# Patient Record
Sex: Male | Born: 1960 | Race: Black or African American | Hispanic: No | Smoking: Former smoker
Health system: Southern US, Community
[De-identification: ages and names within clinical notes are randomized; demographics above are authoritative.]

## PROBLEM LIST (undated history)

## (undated) DIAGNOSIS — R569 Unspecified convulsions: Secondary | ICD-10-CM

## (undated) DIAGNOSIS — I872 Venous insufficiency (chronic) (peripheral): Secondary | ICD-10-CM

## (undated) DIAGNOSIS — I482 Chronic atrial fibrillation, unspecified: Secondary | ICD-10-CM

## (undated) DIAGNOSIS — I509 Heart failure, unspecified: Secondary | ICD-10-CM

## (undated) DIAGNOSIS — G4730 Sleep apnea, unspecified: Secondary | ICD-10-CM

## (undated) DIAGNOSIS — I639 Cerebral infarction, unspecified: Secondary | ICD-10-CM

## (undated) DIAGNOSIS — E119 Type 2 diabetes mellitus without complications: Secondary | ICD-10-CM

## (undated) DIAGNOSIS — I10 Essential (primary) hypertension: Secondary | ICD-10-CM

## (undated) HISTORY — PX: OTHER SURGICAL HISTORY: SHX169

---

## 2013-09-24 DIAGNOSIS — S82122A Displaced fracture of lateral condyle of left tibia, initial encounter for closed fracture: Secondary | ICD-10-CM | POA: Insufficient documentation

## 2013-09-26 DIAGNOSIS — J449 Chronic obstructive pulmonary disease, unspecified: Secondary | ICD-10-CM | POA: Insufficient documentation

## 2013-09-26 DIAGNOSIS — I639 Cerebral infarction, unspecified: Secondary | ICD-10-CM | POA: Insufficient documentation

## 2013-10-15 DIAGNOSIS — R600 Localized edema: Secondary | ICD-10-CM | POA: Insufficient documentation

## 2015-08-22 DIAGNOSIS — I130 Hypertensive heart and chronic kidney disease with heart failure and stage 1 through stage 4 chronic kidney disease, or unspecified chronic kidney disease: Secondary | ICD-10-CM | POA: Insufficient documentation

## 2015-08-22 DIAGNOSIS — I428 Other cardiomyopathies: Secondary | ICD-10-CM | POA: Insufficient documentation

## 2015-08-22 DIAGNOSIS — I5042 Chronic combined systolic (congestive) and diastolic (congestive) heart failure: Secondary | ICD-10-CM | POA: Insufficient documentation

## 2016-06-15 DIAGNOSIS — E119 Type 2 diabetes mellitus without complications: Secondary | ICD-10-CM | POA: Insufficient documentation

## 2017-02-07 DIAGNOSIS — Z720 Tobacco use: Secondary | ICD-10-CM | POA: Insufficient documentation

## 2018-09-11 HISTORY — PX: TRANSTHORACIC ECHOCARDIOGRAM: SHX275

## 2018-09-12 HISTORY — PX: LEFT HEART CATH AND CORONARY ANGIOGRAPHY: CATH118249

## 2018-09-13 DIAGNOSIS — E785 Hyperlipidemia, unspecified: Secondary | ICD-10-CM | POA: Insufficient documentation

## 2018-09-14 DIAGNOSIS — I498 Other specified cardiac arrhythmias: Secondary | ICD-10-CM | POA: Insufficient documentation

## 2019-12-12 DIAGNOSIS — Z6841 Body Mass Index (BMI) 40.0 and over, adult: Secondary | ICD-10-CM | POA: Insufficient documentation

## 2020-06-23 DIAGNOSIS — L97821 Non-pressure chronic ulcer of other part of left lower leg limited to breakdown of skin: Secondary | ICD-10-CM | POA: Insufficient documentation

## 2020-06-23 DIAGNOSIS — T8189XA Other complications of procedures, not elsewhere classified, initial encounter: Secondary | ICD-10-CM | POA: Insufficient documentation

## 2020-12-25 ENCOUNTER — Inpatient Hospital Stay
Admission: EM | Admit: 2020-12-25 | Discharge: 2021-03-08 | DRG: 004 | Disposition: A | Discharge status: Skilled Nursing Facility | Payer: Medicaid Other | Attending: Internal Medicine | Admitting: Internal Medicine

## 2020-12-25 ENCOUNTER — Inpatient Hospital Stay: Payer: Medicaid Other

## 2020-12-25 ENCOUNTER — Encounter
Admission: EM | Disposition: A | Discharge status: Skilled Nursing Facility | Payer: Self-pay | Source: Home / Self Care | Attending: Internal Medicine

## 2020-12-25 ENCOUNTER — Encounter: Payer: Self-pay | Admitting: Cardiology

## 2020-12-25 ENCOUNTER — Emergency Department: Payer: Medicaid Other

## 2020-12-25 DIAGNOSIS — J690 Pneumonitis due to inhalation of food and vomit: Secondary | ICD-10-CM | POA: Diagnosis present

## 2020-12-25 DIAGNOSIS — G931 Anoxic brain damage, not elsewhere classified: Secondary | ICD-10-CM | POA: Diagnosis present

## 2020-12-25 DIAGNOSIS — R7401 Elevation of levels of liver transaminase levels: Secondary | ICD-10-CM | POA: Diagnosis not present

## 2020-12-25 DIAGNOSIS — B964 Proteus (mirabilis) (morganii) as the cause of diseases classified elsewhere: Secondary | ICD-10-CM | POA: Diagnosis not present

## 2020-12-25 DIAGNOSIS — M25561 Pain in right knee: Secondary | ICD-10-CM

## 2020-12-25 DIAGNOSIS — Z66 Do not resuscitate: Secondary | ICD-10-CM | POA: Diagnosis not present

## 2020-12-25 DIAGNOSIS — I5043 Acute on chronic combined systolic (congestive) and diastolic (congestive) heart failure: Secondary | ICD-10-CM | POA: Diagnosis present

## 2020-12-25 DIAGNOSIS — R7989 Other specified abnormal findings of blood chemistry: Secondary | ICD-10-CM

## 2020-12-25 DIAGNOSIS — E785 Hyperlipidemia, unspecified: Secondary | ICD-10-CM | POA: Diagnosis present

## 2020-12-25 DIAGNOSIS — R1312 Dysphagia, oropharyngeal phase: Secondary | ICD-10-CM | POA: Diagnosis not present

## 2020-12-25 DIAGNOSIS — R578 Other shock: Secondary | ICD-10-CM | POA: Diagnosis not present

## 2020-12-25 DIAGNOSIS — L97829 Non-pressure chronic ulcer of other part of left lower leg with unspecified severity: Secondary | ICD-10-CM | POA: Diagnosis not present

## 2020-12-25 DIAGNOSIS — R0602 Shortness of breath: Secondary | ICD-10-CM | POA: Diagnosis not present

## 2020-12-25 DIAGNOSIS — I10 Essential (primary) hypertension: Secondary | ICD-10-CM

## 2020-12-25 DIAGNOSIS — I21A1 Myocardial infarction type 2: Secondary | ICD-10-CM | POA: Diagnosis present

## 2020-12-25 DIAGNOSIS — N189 Chronic kidney disease, unspecified: Secondary | ICD-10-CM

## 2020-12-25 DIAGNOSIS — I428 Other cardiomyopathies: Secondary | ICD-10-CM | POA: Diagnosis present

## 2020-12-25 DIAGNOSIS — R509 Fever, unspecified: Secondary | ICD-10-CM

## 2020-12-25 DIAGNOSIS — T380X5A Adverse effect of glucocorticoids and synthetic analogues, initial encounter: Secondary | ICD-10-CM | POA: Diagnosis not present

## 2020-12-25 DIAGNOSIS — I130 Hypertensive heart and chronic kidney disease with heart failure and stage 1 through stage 4 chronic kidney disease, or unspecified chronic kidney disease: Secondary | ICD-10-CM | POA: Diagnosis present

## 2020-12-25 DIAGNOSIS — I447 Left bundle-branch block, unspecified: Secondary | ICD-10-CM | POA: Diagnosis present

## 2020-12-25 DIAGNOSIS — Z515 Encounter for palliative care: Secondary | ICD-10-CM | POA: Diagnosis not present

## 2020-12-25 DIAGNOSIS — E1165 Type 2 diabetes mellitus with hyperglycemia: Secondary | ICD-10-CM | POA: Diagnosis not present

## 2020-12-25 DIAGNOSIS — I5033 Acute on chronic diastolic (congestive) heart failure: Secondary | ICD-10-CM

## 2020-12-25 DIAGNOSIS — I441 Atrioventricular block, second degree: Secondary | ICD-10-CM

## 2020-12-25 DIAGNOSIS — R140 Abdominal distension (gaseous): Secondary | ICD-10-CM

## 2020-12-25 DIAGNOSIS — N170 Acute kidney failure with tubular necrosis: Secondary | ICD-10-CM | POA: Diagnosis present

## 2020-12-25 DIAGNOSIS — Z8673 Personal history of transient ischemic attack (TIA), and cerebral infarction without residual deficits: Secondary | ICD-10-CM

## 2020-12-25 DIAGNOSIS — I63511 Cerebral infarction due to unspecified occlusion or stenosis of right middle cerebral artery: Secondary | ICD-10-CM | POA: Diagnosis not present

## 2020-12-25 DIAGNOSIS — I2729 Other secondary pulmonary hypertension: Secondary | ICD-10-CM | POA: Diagnosis present

## 2020-12-25 DIAGNOSIS — R0989 Other specified symptoms and signs involving the circulatory and respiratory systems: Secondary | ICD-10-CM

## 2020-12-25 DIAGNOSIS — Z452 Encounter for adjustment and management of vascular access device: Secondary | ICD-10-CM

## 2020-12-25 DIAGNOSIS — M79662 Pain in left lower leg: Secondary | ICD-10-CM

## 2020-12-25 DIAGNOSIS — Z6837 Body mass index (BMI) 37.0-37.9, adult: Secondary | ICD-10-CM | POA: Diagnosis not present

## 2020-12-25 DIAGNOSIS — J156 Pneumonia due to other aerobic Gram-negative bacteria: Secondary | ICD-10-CM | POA: Diagnosis not present

## 2020-12-25 DIAGNOSIS — M1711 Unilateral primary osteoarthritis, right knee: Secondary | ICD-10-CM

## 2020-12-25 DIAGNOSIS — M7989 Other specified soft tissue disorders: Secondary | ICD-10-CM

## 2020-12-25 DIAGNOSIS — I420 Dilated cardiomyopathy: Secondary | ICD-10-CM | POA: Diagnosis not present

## 2020-12-25 DIAGNOSIS — E559 Vitamin D deficiency, unspecified: Secondary | ICD-10-CM | POA: Diagnosis present

## 2020-12-25 DIAGNOSIS — I5021 Acute systolic (congestive) heart failure: Secondary | ICD-10-CM | POA: Diagnosis not present

## 2020-12-25 DIAGNOSIS — I5020 Unspecified systolic (congestive) heart failure: Secondary | ICD-10-CM | POA: Diagnosis not present

## 2020-12-25 DIAGNOSIS — G939 Disorder of brain, unspecified: Secondary | ICD-10-CM | POA: Diagnosis not present

## 2020-12-25 DIAGNOSIS — R109 Unspecified abdominal pain: Secondary | ICD-10-CM

## 2020-12-25 DIAGNOSIS — J9602 Acute respiratory failure with hypercapnia: Secondary | ICD-10-CM | POA: Diagnosis present

## 2020-12-25 DIAGNOSIS — D638 Anemia in other chronic diseases classified elsewhere: Secondary | ICD-10-CM | POA: Diagnosis present

## 2020-12-25 DIAGNOSIS — T4275XA Adverse effect of unspecified antiepileptic and sedative-hypnotic drugs, initial encounter: Secondary | ICD-10-CM | POA: Diagnosis not present

## 2020-12-25 DIAGNOSIS — N179 Acute kidney failure, unspecified: Secondary | ICD-10-CM | POA: Diagnosis not present

## 2020-12-25 DIAGNOSIS — N1831 Chronic kidney disease, stage 3a: Secondary | ICD-10-CM | POA: Diagnosis present

## 2020-12-25 DIAGNOSIS — Z978 Presence of other specified devices: Secondary | ICD-10-CM

## 2020-12-25 DIAGNOSIS — R5381 Other malaise: Secondary | ICD-10-CM | POA: Diagnosis not present

## 2020-12-25 DIAGNOSIS — I462 Cardiac arrest due to underlying cardiac condition: Secondary | ICD-10-CM | POA: Diagnosis present

## 2020-12-25 DIAGNOSIS — J9601 Acute respiratory failure with hypoxia: Secondary | ICD-10-CM | POA: Diagnosis not present

## 2020-12-25 DIAGNOSIS — G9341 Metabolic encephalopathy: Secondary | ICD-10-CM | POA: Diagnosis not present

## 2020-12-25 DIAGNOSIS — E875 Hyperkalemia: Secondary | ICD-10-CM | POA: Diagnosis present

## 2020-12-25 DIAGNOSIS — E1122 Type 2 diabetes mellitus with diabetic chronic kidney disease: Secondary | ICD-10-CM | POA: Diagnosis present

## 2020-12-25 DIAGNOSIS — R570 Cardiogenic shock: Secondary | ICD-10-CM | POA: Diagnosis present

## 2020-12-25 DIAGNOSIS — I639 Cerebral infarction, unspecified: Secondary | ICD-10-CM

## 2020-12-25 DIAGNOSIS — E872 Acidosis, unspecified: Secondary | ICD-10-CM

## 2020-12-25 DIAGNOSIS — J810 Acute pulmonary edema: Secondary | ICD-10-CM

## 2020-12-25 DIAGNOSIS — F09 Unspecified mental disorder due to known physiological condition: Secondary | ICD-10-CM | POA: Diagnosis not present

## 2020-12-25 DIAGNOSIS — L8990 Pressure ulcer of unspecified site, unspecified stage: Secondary | ICD-10-CM | POA: Insufficient documentation

## 2020-12-25 DIAGNOSIS — L89322 Pressure ulcer of left buttock, stage 2: Secondary | ICD-10-CM | POA: Diagnosis not present

## 2020-12-25 DIAGNOSIS — R9089 Other abnormal findings on diagnostic imaging of central nervous system: Secondary | ICD-10-CM | POA: Diagnosis not present

## 2020-12-25 DIAGNOSIS — E870 Hyperosmolality and hypernatremia: Secondary | ICD-10-CM | POA: Diagnosis not present

## 2020-12-25 DIAGNOSIS — I469 Cardiac arrest, cause unspecified: Secondary | ICD-10-CM | POA: Diagnosis not present

## 2020-12-25 DIAGNOSIS — I9581 Postprocedural hypotension: Secondary | ICD-10-CM | POA: Diagnosis not present

## 2020-12-25 DIAGNOSIS — L89312 Pressure ulcer of right buttock, stage 2: Secondary | ICD-10-CM | POA: Diagnosis not present

## 2020-12-25 DIAGNOSIS — G839 Paralytic syndrome, unspecified: Secondary | ICD-10-CM | POA: Diagnosis not present

## 2020-12-25 DIAGNOSIS — I472 Ventricular tachycardia: Secondary | ICD-10-CM | POA: Diagnosis present

## 2020-12-25 DIAGNOSIS — G928 Other toxic encephalopathy: Secondary | ICD-10-CM | POA: Diagnosis not present

## 2020-12-25 DIAGNOSIS — Z20822 Contact with and (suspected) exposure to covid-19: Secondary | ICD-10-CM | POA: Diagnosis present

## 2020-12-25 DIAGNOSIS — Z4659 Encounter for fitting and adjustment of other gastrointestinal appliance and device: Secondary | ICD-10-CM

## 2020-12-25 DIAGNOSIS — Z7189 Other specified counseling: Secondary | ICD-10-CM | POA: Diagnosis not present

## 2020-12-25 DIAGNOSIS — I872 Venous insufficiency (chronic) (peripheral): Secondary | ICD-10-CM | POA: Diagnosis present

## 2020-12-25 DIAGNOSIS — I110 Hypertensive heart disease with heart failure: Secondary | ICD-10-CM | POA: Diagnosis not present

## 2020-12-25 DIAGNOSIS — R319 Hematuria, unspecified: Secondary | ICD-10-CM | POA: Diagnosis not present

## 2020-12-25 DIAGNOSIS — K9423 Gastrostomy malfunction: Secondary | ICD-10-CM

## 2020-12-25 DIAGNOSIS — J1569 Pneumonia due to other gram-negative bacteria: Secondary | ICD-10-CM

## 2020-12-25 DIAGNOSIS — E0781 Sick-euthyroid syndrome: Secondary | ICD-10-CM | POA: Diagnosis present

## 2020-12-25 DIAGNOSIS — N171 Acute kidney failure with acute cortical necrosis: Secondary | ICD-10-CM | POA: Diagnosis not present

## 2020-12-25 DIAGNOSIS — Z95828 Presence of other vascular implants and grafts: Secondary | ICD-10-CM

## 2020-12-25 DIAGNOSIS — R569 Unspecified convulsions: Secondary | ICD-10-CM | POA: Diagnosis not present

## 2020-12-25 DIAGNOSIS — R4182 Altered mental status, unspecified: Secondary | ICD-10-CM | POA: Diagnosis not present

## 2020-12-25 DIAGNOSIS — I495 Sick sinus syndrome: Secondary | ICD-10-CM | POA: Diagnosis not present

## 2020-12-25 HISTORY — DX: Type 2 diabetes mellitus without complications: E11.9

## 2020-12-25 HISTORY — PX: RIGHT HEART CATH: CATH118263

## 2020-12-25 HISTORY — PX: LEFT HEART CATH AND CORONARY ANGIOGRAPHY: CATH118249

## 2020-12-25 HISTORY — DX: Venous insufficiency (chronic) (peripheral): I87.2

## 2020-12-25 HISTORY — DX: Essential (primary) hypertension: I10

## 2020-12-25 HISTORY — DX: Heart failure, unspecified: I50.9

## 2020-12-25 LAB — POCT ACTIVATED CLOTTING TIME: Activated Clotting Time: 237 seconds

## 2020-12-25 LAB — BLOOD GAS, ARTERIAL
Acid-base deficit: 15.2 mmol/L — ABNORMAL HIGH (ref 0.0–2.0)
Acid-base deficit: 10.3 mmol/L — ABNORMAL HIGH (ref 0.0–2.0)
Acid-base deficit: 9.1 mmol/L — ABNORMAL HIGH (ref 0.0–2.0)
Bicarbonate: 16.3 mmol/L — ABNORMAL LOW (ref 20.0–28.0)
Bicarbonate: 22.9 mmol/L (ref 20.0–28.0)
Bicarbonate: 21.8 mmol/L (ref 20.0–28.0)
FIO2: 1
FIO2: 100
FIO2: 100
MECHVT: 500 mL
MECHVT: 500 mL
MECHVT: 550 mL
Mechanical Rate: 28
Mechanical Rate: 28
O2 Saturation: 81.9 %
O2 Saturation: 61.8 %
O2 Saturation: 80.3 %
PEEP: 10 cmH2O
PEEP: 14 cmH2O
PEEP: 14 cmH2O
Patient temperature: 37
Patient temperature: 37
Patient temperature: 37
RATE: 20 resp/min
RATE: 28 resp/min
pCO2 arterial: 63 mmHg — ABNORMAL HIGH (ref 32.0–48.0)
pCO2 arterial: 93 mmHg (ref 32.0–48.0)
pCO2 arterial: 72 mmHg (ref 32.0–48.0)
pH, Arterial: 7.02 — CL (ref 7.350–7.450)
pH, Arterial: 7 — CL (ref 7.350–7.450)
pH, Arterial: 7.09 — CL (ref 7.350–7.450)
pO2, Arterial: 69 mmHg — ABNORMAL LOW (ref 83.0–108.0)
pO2, Arterial: 50 mmHg — ABNORMAL LOW (ref 83.0–108.0)
pO2, Arterial: 62 mmHg — ABNORMAL LOW (ref 83.0–108.0)

## 2020-12-25 LAB — CBC WITH DIFFERENTIAL/PLATELET
Abs Immature Granulocytes: 0.17 10*3/uL — ABNORMAL HIGH (ref 0.00–0.07)
Basophils Absolute: 0.1 10*3/uL (ref 0.0–0.1)
Basophils Relative: 0 %
Eosinophils Absolute: 0.3 10*3/uL (ref 0.0–0.5)
Eosinophils Relative: 2 %
HCT: 46.6 % (ref 39.0–52.0)
Hemoglobin: 14 g/dL (ref 13.0–17.0)
Immature Granulocytes: 1 %
Lymphocytes Relative: 63 %
Lymphs Abs: 11.1 10*3/uL — ABNORMAL HIGH (ref 0.7–4.0)
MCH: 25 pg — ABNORMAL LOW (ref 26.0–34.0)
MCHC: 30 g/dL (ref 30.0–36.0)
MCV: 83.4 fL (ref 80.0–100.0)
Monocytes Absolute: 0.9 10*3/uL (ref 0.1–1.0)
Monocytes Relative: 5 %
Neutro Abs: 5.1 10*3/uL (ref 1.7–7.7)
Neutrophils Relative %: 29 %
Platelets: 171 10*3/uL (ref 150–400)
RBC: 5.59 MIL/uL (ref 4.22–5.81)
RDW: 16.8 % — ABNORMAL HIGH (ref 11.5–15.5)
Smear Review: UNDETERMINED
WBC: 17.7 10*3/uL — ABNORMAL HIGH (ref 4.0–10.5)
nRBC: 0.1 % (ref 0.0–0.2)

## 2020-12-25 LAB — PROCALCITONIN: Procalcitonin: <0.1 ng/mL

## 2020-12-25 LAB — URINALYSIS, COMPLETE (UACMP) WITH MICROSCOPIC
Bacteria, UA: NONE SEEN
Bilirubin Urine: NEGATIVE
Glucose, UA: NEGATIVE mg/dL
Ketones, ur: NEGATIVE mg/dL
Leukocytes,Ua: NEGATIVE
Nitrite: NEGATIVE
Protein, ur: 100 mg/dL — AB
RBC / HPF: >50 RBC/hpf — ABNORMAL HIGH (ref 0–5)
Specific Gravity, Urine: 1.017 (ref 1.005–1.030)
pH: 5 (ref 5.0–8.0)

## 2020-12-25 LAB — PHOSPHORUS: Phosphorus: 7.9 mg/dL — ABNORMAL HIGH (ref 2.5–4.6)

## 2020-12-25 LAB — COMPREHENSIVE METABOLIC PANEL
ALT: 58 U/L — ABNORMAL HIGH (ref 0–44)
AST: 85 U/L — ABNORMAL HIGH (ref 15–41)
Albumin: 3.2 g/dL — ABNORMAL LOW (ref 3.5–5.0)
Alkaline Phosphatase: 82 U/L (ref 38–126)
Anion gap: 6 (ref 5–15)
BUN: 21 mg/dL — ABNORMAL HIGH (ref 6–20)
CO2: 23 mmol/L (ref 22–32)
Calcium: 8 mg/dL — ABNORMAL LOW (ref 8.9–10.3)
Chloride: 106 mmol/L (ref 98–111)
Creatinine, Ser: 2.16 mg/dL — ABNORMAL HIGH (ref 0.61–1.24)
GFR, Estimated: 34 mL/min — ABNORMAL LOW (ref >60)
Glucose, Bld: 267 mg/dL — ABNORMAL HIGH (ref 70–99)
Potassium: 5.5 mmol/L — ABNORMAL HIGH (ref 3.5–5.1)
Sodium: 135 mmol/L (ref 135–145)
Total Bilirubin: 0.6 mg/dL (ref 0.3–1.2)
Total Protein: 7.9 g/dL (ref 6.5–8.1)

## 2020-12-25 LAB — URINE DRUG SCREEN, QUALITATIVE (ARMC ONLY)
Amphetamines, Ur Screen: NOT DETECTED
Barbiturates, Ur Screen: NOT DETECTED
Benzodiazepine, Ur Scrn: POSITIVE — AB
Cannabinoid 50 Ng, Ur ~~LOC~~: NOT DETECTED
Cocaine Metabolite,Ur ~~LOC~~: NOT DETECTED
MDMA (Ecstasy)Ur Screen: NOT DETECTED
Methadone Scn, Ur: NOT DETECTED
Opiate, Ur Screen: POSITIVE — AB
Phencyclidine (PCP) Ur S: NOT DETECTED
Tricyclic, Ur Screen: NOT DETECTED

## 2020-12-25 LAB — GLUCOSE, CAPILLARY
Glucose-Capillary: 114 mg/dL — ABNORMAL HIGH (ref 70–99)
Glucose-Capillary: 180 mg/dL — ABNORMAL HIGH (ref 70–99)

## 2020-12-25 LAB — RESP PANEL BY RT-PCR (FLU A&B, COVID) ARPGX2
Influenza A by PCR: NEGATIVE
Influenza B by PCR: NEGATIVE
SARS Coronavirus 2 by RT PCR: NEGATIVE

## 2020-12-25 LAB — BRAIN NATRIURETIC PEPTIDE: B Natriuretic Peptide: 274.6 pg/mL — ABNORMAL HIGH (ref 0.0–100.0)

## 2020-12-25 LAB — APTT: aPTT: 36 seconds (ref 24–36)

## 2020-12-25 LAB — MRSA PCR SCREENING: MRSA by PCR: NEGATIVE

## 2020-12-25 LAB — LACTIC ACID, PLASMA
Lactic Acid, Venous: 7.8 mmol/L (ref 0.5–1.9)
Lactic Acid, Venous: 1.9 mmol/L (ref 0.5–1.9)

## 2020-12-25 LAB — CBG MONITORING, ED: Glucose-Capillary: 277 mg/dL — ABNORMAL HIGH (ref 70–99)

## 2020-12-25 LAB — POTASSIUM: Potassium: 5.5 mmol/L — ABNORMAL HIGH (ref 3.5–5.1)

## 2020-12-25 LAB — PROTIME-INR
INR: 1.2 (ref 0.8–1.2)
Prothrombin Time: 14.7 seconds (ref 11.4–15.2)

## 2020-12-25 LAB — TROPONIN I (HIGH SENSITIVITY)
Troponin I (High Sensitivity): 310 ng/L (ref <18)
Troponin I (High Sensitivity): 2479 ng/L (ref <18)

## 2020-12-25 LAB — MAGNESIUM: Magnesium: 2.1 mg/dL (ref 1.7–2.4)

## 2020-12-25 IMAGING — DX DG CHEST 1V PORT
1 series · 1 of 1 positions shown · non-contrast
Comparison: None.

CLINICAL DATA: Intubated. Respiratory distress. Status post cardiac
arrest.

EXAM:
PORTABLE CHEST 1 VIEW

[chest ap]
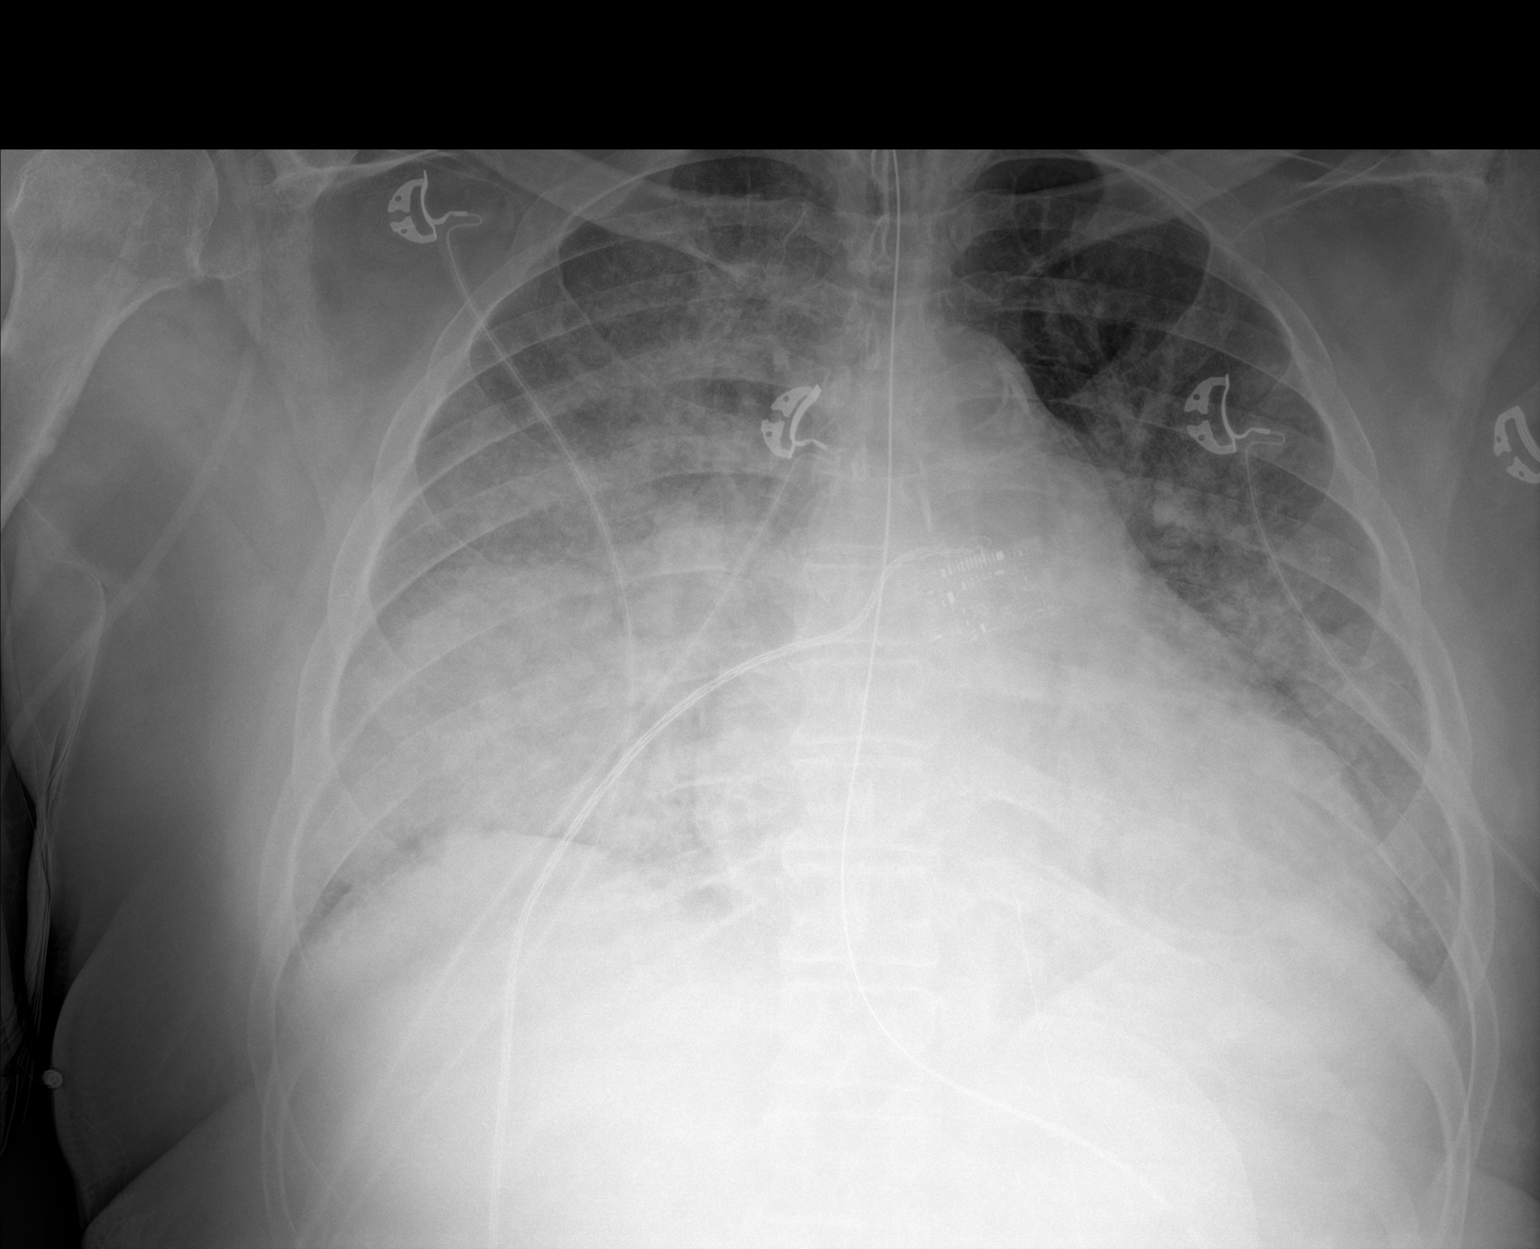

[1 of 1 positions shown; findings below may reference images not displayed]

FINDINGS: Enlarged cardiac silhouette. Endotracheal tube in satisfactory
position. Nasogastric tube extending into stomach with the side hole
visible in the proximal stomach. Extensive bilateral airspace
opacity, most pronounced in the right lower lung zone. No pleural
fluid. Unremarkable bones.
IMPRESSION: 1. Extensive bilateral airspace opacity most pronounced in right
lower lung zone. Could represent pulmonary hemorrhage, pneumonia/or
alveolar edema.
2. Cardiomegaly.
3. Endotracheal tube satisfactory position

## 2020-12-25 IMAGING — DX DG CHEST 1V PORT
1 series · 1 of 1 positions shown · non-contrast
Comparison: Chest radiograph dated [DATE].

CLINICAL DATA: 59-year-old male with central line placement.

EXAM:
PORTABLE CHEST 1 VIEW

[chest ap]
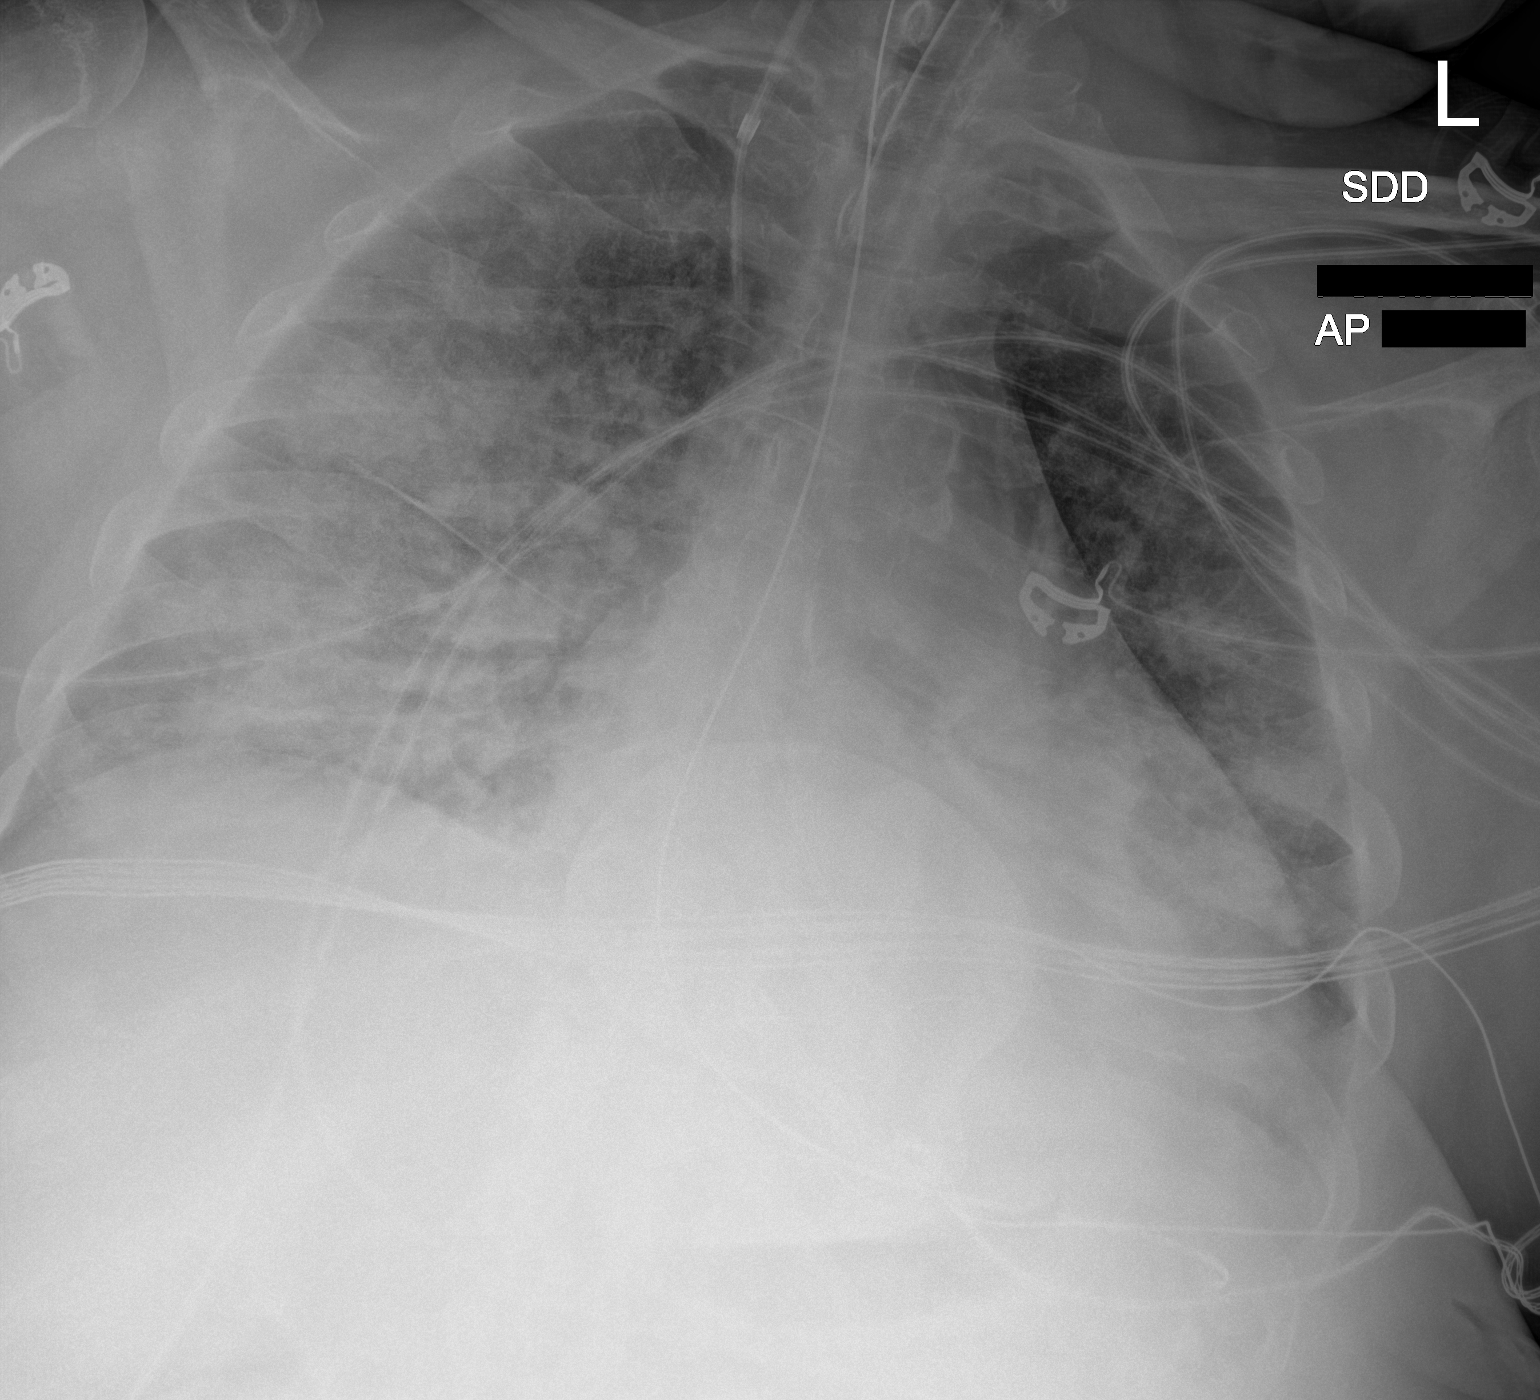

[1 of 1 positions shown; findings below may reference images not displayed]

FINDINGS: Endotracheal tube above the carina in similar position. Enteric tube
extends below the diaphragm with tip in the proximal stomach. There
has been retraction of the Swan-Ganz catheter with tip of the
catheter now over the upper SVC.

Diffuse bilateral pulmonary opacities similar or slightly improved
on the right. A small left pleural effusion may be present. No
pneumothorax. Stable cardiomegaly. No acute osseous pathology.
IMPRESSION: 1. Interval retraction of the Swan-Ganz catheter with tip now over
the upper SVC.
2. Diffuse bilateral pulmonary opacities, similar or slightly
improved on the right.

## 2020-12-25 IMAGING — DX DG CHEST 1V PORT
1 series · 1 of 1 positions shown · non-contrast
Comparison: Film from earlier in the same day.

CLINICAL DATA: Shortness of breath, status TIGER placement

EXAM:
PORTABLE CHEST 1 VIEW

[chest ap]
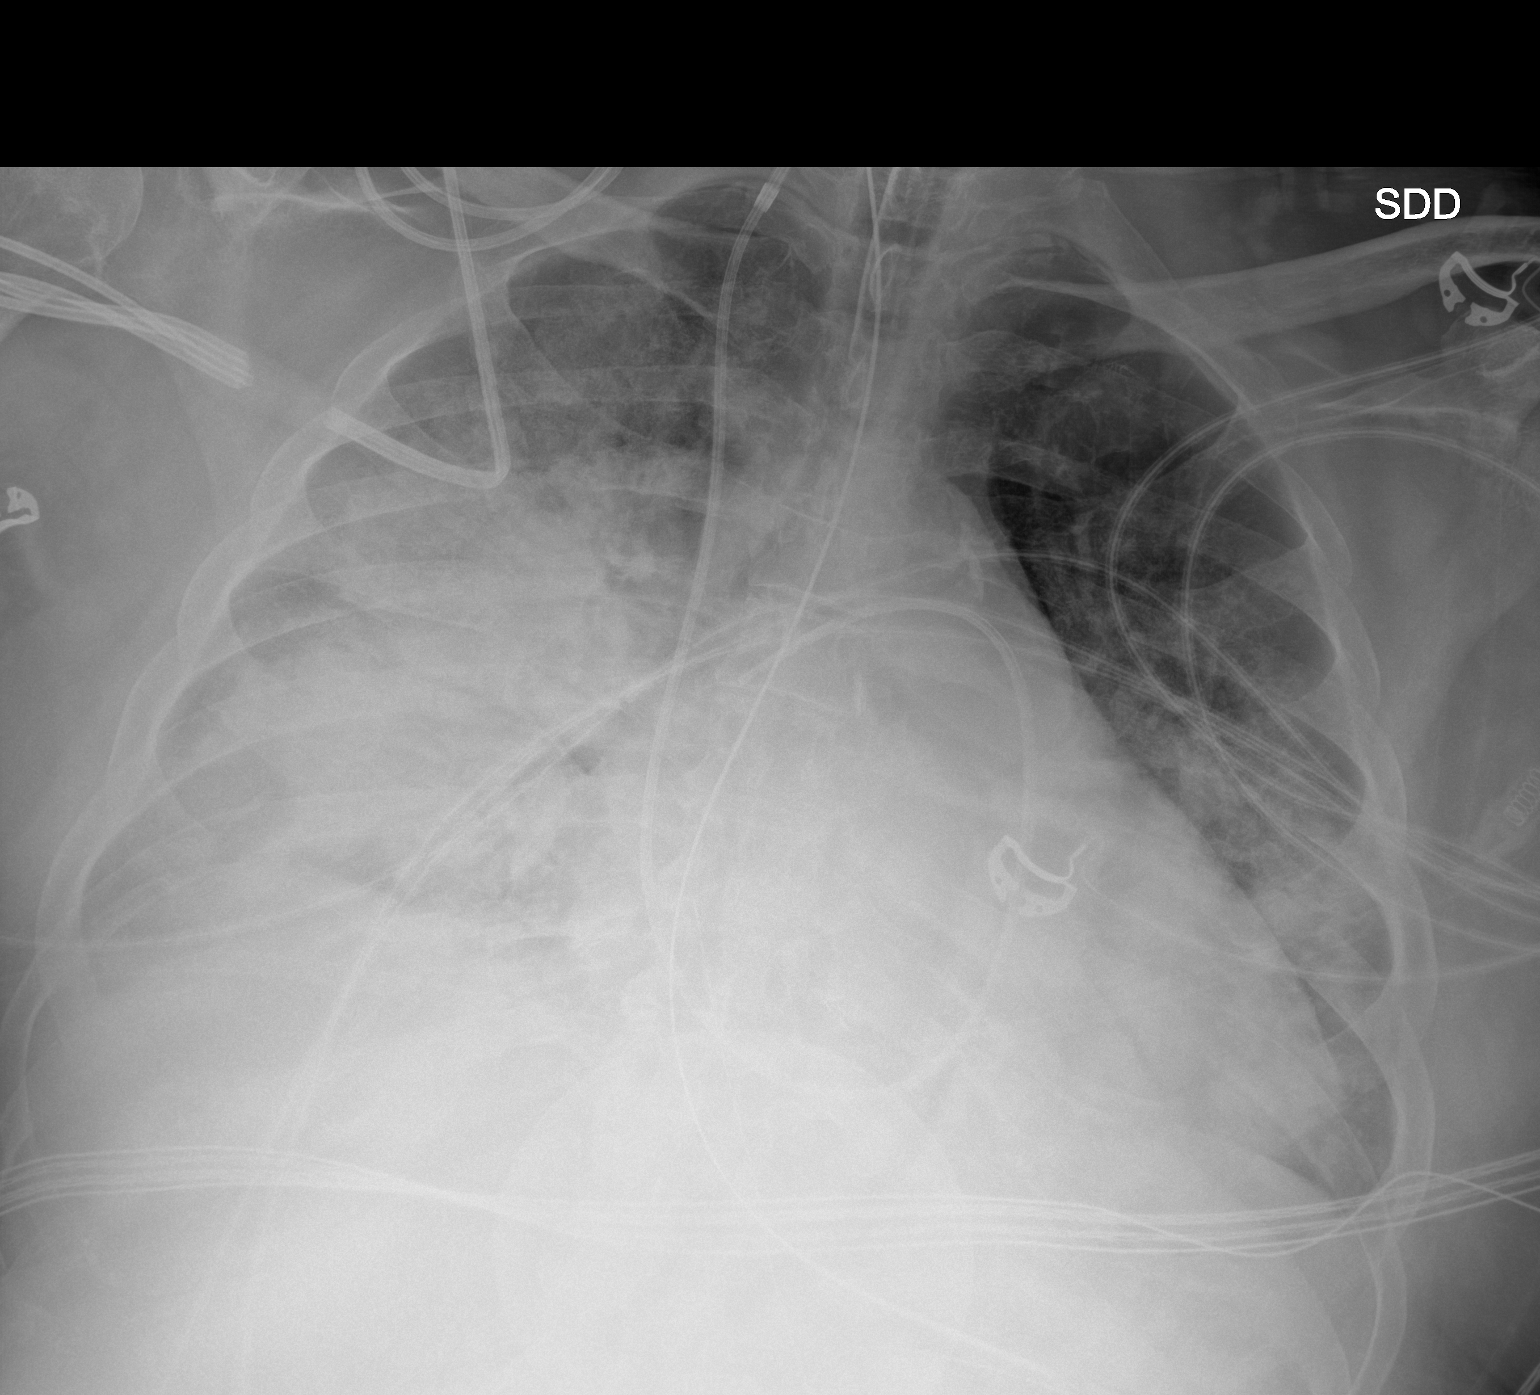

[1 of 1 positions shown; findings below may reference images not displayed]

FINDINGS: Cardiac shadow is stable. Swan-Ganz catheter is noted via the right
jugular approach with the tip in the right pulmonary artery.
Endotracheal tube and gastric catheter are noted in satisfactory
position. Persistent bilateral airspace opacity is noted right
considerably greater than left likely representing edema. Continued
follow-up is recommended. No pneumothorax is noted.
IMPRESSION: No pneumothorax following TIGER placement. The remainder of the
exam is stable.

## 2020-12-25 SURGERY — LEFT HEART CATH AND CORONARY ANGIOGRAPHY
Anesthesia: Moderate Sedation

## 2020-12-25 MED ORDER — LIDOCAINE HCL (PF) 1 % IJ SOLN
INTRAMUSCULAR | Status: DC | PRN
  Administered 2020-12-25: 2 mL

## 2020-12-25 MED ORDER — MIDAZOLAM HCL 2 MG/2ML IJ SOLN
INTRAMUSCULAR | Status: DC | PRN
  Administered 2020-12-25: 1 mg via INTRAVENOUS

## 2020-12-25 MED ORDER — ALBUTEROL SULFATE (2.5 MG/3ML) 0.083% IN NEBU
2.5000 mg | INHALATION_SOLUTION | RESPIRATORY_TRACT | Status: DC | PRN
  Administered 2021-01-14: 2.5 mg via RESPIRATORY_TRACT
  Filled 2020-12-25: qty 3

## 2020-12-25 MED ORDER — PROPOFOL 1000 MG/100ML IV EMUL: 5.0000 ug/kg/min | INTRAVENOUS | Status: DC

## 2020-12-25 MED ORDER — HEPARIN SODIUM (PORCINE) 5000 UNIT/ML IJ SOLN
5000.0000 [IU] | Freq: Three times a day (TID) | INTRAMUSCULAR | Status: DC
  Administered 2020-12-25 – 2020-12-30 (×14): 5000 [IU] via SUBCUTANEOUS
  Filled 2020-12-25 (×14): qty 1

## 2020-12-25 MED ORDER — MIDAZOLAM HCL 2 MG/2ML IJ SOLN
INTRAMUSCULAR | Status: AC
  Filled 2020-12-25: qty 2

## 2020-12-25 MED ORDER — MIDAZOLAM BOLUS VIA INFUSION
2.0000 mg | INTRAVENOUS | Status: DC | PRN
  Administered 2020-12-25: 2 mg via INTRAVENOUS
  Filled 2020-12-25: qty 2

## 2020-12-25 MED ORDER — FUROSEMIDE 10 MG/ML IJ SOLN
INTRAMUSCULAR | Status: AC
  Administered 2020-12-25: 80 mg via INTRAVENOUS
  Filled 2020-12-25: qty 8

## 2020-12-25 MED ORDER — EPINEPHRINE 1 MG/10ML IJ SOSY
PREFILLED_SYRINGE | INTRAMUSCULAR | Status: AC | PRN
  Administered 2020-12-25 (×2): 1 mg via INTRAVENOUS

## 2020-12-25 MED ORDER — FUROSEMIDE 10 MG/ML IJ SOLN: 80.0000 mg | Freq: Once | INTRAMUSCULAR | Status: AC

## 2020-12-25 MED ORDER — MORPHINE SULFATE (PF) 2 MG/ML IV SOLN
INTRAVENOUS | Status: AC
  Filled 2020-12-25: qty 1

## 2020-12-25 MED ORDER — NOREPINEPHRINE 4 MG/250ML-% IV SOLN
INTRAVENOUS | Status: AC
  Filled 2020-12-25: qty 250

## 2020-12-25 MED ORDER — SODIUM CHLORIDE 0.9 % IV SOLN
1.5000 g | Freq: Four times a day (QID) | INTRAVENOUS | Status: DC
  Administered 2020-12-25 – 2020-12-28 (×11): 1.5 g via INTRAVENOUS
  Filled 2020-12-25 (×2): qty 4
  Filled 2020-12-25: qty 1.5
  Filled 2020-12-25: qty 4
  Filled 2020-12-25 (×3): qty 1.5
  Filled 2020-12-25: qty 4
  Filled 2020-12-25 (×5): qty 1.5
  Filled 2020-12-25: qty 4
  Filled 2020-12-25: qty 1.5

## 2020-12-25 MED ORDER — PROPOFOL 1000 MG/100ML IV EMUL
INTRAVENOUS | Status: AC
  Administered 2020-12-25: 40 ug/kg/min via INTRAVENOUS
  Filled 2020-12-25: qty 100

## 2020-12-25 MED ORDER — NOREPINEPHRINE 4 MG/250ML-% IV SOLN
5.0000 ug/min | INTRAVENOUS | Status: DC
  Administered 2020-12-25: 20 ug/min via INTRAVENOUS
  Administered 2020-12-26: 8 ug/min via INTRAVENOUS
  Administered 2020-12-26: 16 ug/min via INTRAVENOUS
  Administered 2020-12-26: 14 ug/min via INTRAVENOUS
  Administered 2020-12-26: 16 ug/min via INTRAVENOUS
  Administered 2020-12-26: 12 ug/min via INTRAVENOUS
  Filled 2020-12-25 (×6): qty 250

## 2020-12-25 MED ORDER — POLYETHYLENE GLYCOL 3350 17 G PO PACK
17.0000 g | PACK | Freq: Every day | ORAL | Status: DC
  Administered 2020-12-27 – 2021-02-25 (×41): 17 g
  Filled 2020-12-25 (×42): qty 1

## 2020-12-25 MED ORDER — MIDAZOLAM 50MG/50ML (1MG/ML) PREMIX INFUSION
0.5000 mg/h | INTRAVENOUS | Status: DC
  Administered 2020-12-25: 2 mg/h via INTRAVENOUS
  Administered 2020-12-26 (×2): 8 mg/h via INTRAVENOUS
  Filled 2020-12-25 (×3): qty 50

## 2020-12-25 MED ORDER — HEPARIN SODIUM (PORCINE) 1000 UNIT/ML IJ SOLN
INTRAMUSCULAR | Status: AC
  Filled 2020-12-25: qty 1

## 2020-12-25 MED ORDER — SODIUM CHLORIDE 0.9 % IV SOLN
100.0000 mg | Freq: Two times a day (BID) | INTRAVENOUS | Status: DC
  Administered 2020-12-25 – 2020-12-27 (×4): 100 mg via INTRAVENOUS
  Filled 2020-12-25 (×6): qty 100

## 2020-12-25 MED ORDER — CHLORHEXIDINE GLUCONATE CLOTH 2 % EX PADS
6.0000 | MEDICATED_PAD | Freq: Every day | CUTANEOUS | Status: DC
  Administered 2020-12-26 – 2021-02-25 (×55): 6 via TOPICAL

## 2020-12-25 MED ORDER — SODIUM CHLORIDE 0.9 % IV SOLN
250.0000 mL | INTRAVENOUS | Status: DC
  Administered 2020-12-31 – 2021-01-03 (×2): 250 mL via INTRAVENOUS

## 2020-12-25 MED ORDER — DOCUSATE SODIUM 100 MG PO CAPS: 100.0000 mg | ORAL_CAPSULE | Freq: Two times a day (BID) | ORAL | Status: DC | PRN

## 2020-12-25 MED ORDER — FENTANYL CITRATE (PF) 100 MCG/2ML IJ SOLN
INTRAMUSCULAR | Status: AC
  Filled 2020-12-25: qty 2

## 2020-12-25 MED ORDER — SODIUM CHLORIDE 0.9 % IV SOLN
1.0000 g | INTRAVENOUS | Status: DC
  Filled 2020-12-25: qty 10

## 2020-12-25 MED ORDER — NOREPINEPHRINE BITARTRATE 1 MG/ML IV SOLN
INTRAVENOUS | Status: AC
  Filled 2020-12-25: qty 4

## 2020-12-25 MED ORDER — FENTANYL CITRATE (PF) 100 MCG/2ML IJ SOLN
50.0000 ug | Freq: Once | INTRAMUSCULAR | Status: DC
  Filled 2020-12-25: qty 2

## 2020-12-25 MED ORDER — FUROSEMIDE 10 MG/ML IJ SOLN
10.0000 mg/h | INTRAVENOUS | Status: DC
  Administered 2020-12-25: 10 mg/h via INTRAVENOUS
  Filled 2020-12-25: qty 20

## 2020-12-25 MED ORDER — FENTANYL CITRATE (PF) 100 MCG/2ML IJ SOLN
INTRAMUSCULAR | Status: DC | PRN
  Administered 2020-12-25: 50 ug via INTRAVENOUS

## 2020-12-25 MED ORDER — HEPARIN SODIUM (PORCINE) 1000 UNIT/ML IJ SOLN
INTRAMUSCULAR | Status: DC | PRN
  Administered 2020-12-25: 8000 [IU] via INTRAVENOUS

## 2020-12-25 MED ORDER — PROPOFOL 1000 MG/100ML IV EMUL
5.0000 ug/kg/min | INTRAVENOUS | Status: DC
  Administered 2020-12-25 (×2): 40 ug/kg/min via INTRAVENOUS
  Filled 2020-12-25: qty 100

## 2020-12-25 MED ORDER — HEPARIN (PORCINE) IN NACL 1000-0.9 UT/500ML-% IV SOLN
INTRAVENOUS | Status: AC
  Filled 2020-12-25: qty 1000

## 2020-12-25 MED ORDER — DOCUSATE SODIUM 50 MG/5ML PO LIQD
100.0000 mg | Freq: Two times a day (BID) | ORAL | Status: DC
  Administered 2020-12-25 – 2021-02-25 (×86): 100 mg
  Filled 2020-12-25 (×110): qty 10

## 2020-12-25 MED ORDER — POLYETHYLENE GLYCOL 3350 17 G PO PACK: 17.0000 g | PACK | Freq: Every day | ORAL | Status: DC | PRN

## 2020-12-25 MED ORDER — FENTANYL 2500MCG IN NS 250ML (10MCG/ML) PREMIX INFUSION
50.0000 ug/h | INTRAVENOUS | Status: DC
  Administered 2020-12-25: 50 ug/h via INTRAVENOUS
  Filled 2020-12-25: qty 250

## 2020-12-25 MED ORDER — HEPARIN (PORCINE) IN NACL 2000-0.9 UNIT/L-% IV SOLN
INTRAVENOUS | Status: DC | PRN
  Administered 2020-12-25: 1000 mL

## 2020-12-25 MED ORDER — FENTANYL BOLUS VIA INFUSION
50.0000 ug | INTRAVENOUS | Status: DC | PRN
  Filled 2020-12-25: qty 100

## 2020-12-25 MED ORDER — INSULIN ASPART 100 UNIT/ML IJ SOLN
0.0000 [IU] | INTRAMUSCULAR | Status: DC
  Administered 2020-12-26: 2 [IU] via SUBCUTANEOUS
  Administered 2020-12-26: 3 [IU] via SUBCUTANEOUS
  Administered 2020-12-26 – 2021-01-05 (×8): 2 [IU] via SUBCUTANEOUS
  Administered 2021-01-05 – 2021-01-06 (×4): 3 [IU] via SUBCUTANEOUS
  Administered 2021-01-06 (×2): 2 [IU] via SUBCUTANEOUS
  Administered 2021-01-06 – 2021-01-07 (×8): 3 [IU] via SUBCUTANEOUS
  Administered 2021-01-07 – 2021-01-10 (×13): 2 [IU] via SUBCUTANEOUS
  Administered 2021-01-10: 3 [IU] via SUBCUTANEOUS
  Administered 2021-01-10 – 2021-01-11 (×5): 2 [IU] via SUBCUTANEOUS
  Administered 2021-01-12: 3 [IU] via SUBCUTANEOUS
  Administered 2021-01-12 – 2021-01-13 (×6): 2 [IU] via SUBCUTANEOUS
  Administered 2021-01-14: 3 [IU] via SUBCUTANEOUS
  Administered 2021-01-14 (×2): 2 [IU] via SUBCUTANEOUS
  Administered 2021-01-14: 3 [IU] via SUBCUTANEOUS
  Administered 2021-01-14: 2 [IU] via SUBCUTANEOUS
  Administered 2021-01-15 – 2021-01-16 (×8): 3 [IU] via SUBCUTANEOUS
  Administered 2021-01-16 – 2021-01-17 (×3): 2 [IU] via SUBCUTANEOUS
  Administered 2021-01-17: 3 [IU] via SUBCUTANEOUS
  Administered 2021-01-17 (×2): 2 [IU] via SUBCUTANEOUS
  Administered 2021-01-17: 3 [IU] via SUBCUTANEOUS
  Administered 2021-01-18 (×6): 2 [IU] via SUBCUTANEOUS
  Administered 2021-01-18: 3 [IU] via SUBCUTANEOUS
  Administered 2021-01-19 (×2): 2 [IU] via SUBCUTANEOUS
  Administered 2021-01-19: 3 [IU] via SUBCUTANEOUS
  Administered 2021-01-19 (×3): 2 [IU] via SUBCUTANEOUS
  Administered 2021-01-20: 3 [IU] via SUBCUTANEOUS
  Administered 2021-01-20 (×2): 2 [IU] via SUBCUTANEOUS
  Administered 2021-01-20: 3 [IU] via SUBCUTANEOUS
  Administered 2021-01-20 (×2): 2 [IU] via SUBCUTANEOUS
  Administered 2021-01-21: 3 [IU] via SUBCUTANEOUS
  Administered 2021-01-21 – 2021-01-22 (×6): 2 [IU] via SUBCUTANEOUS
  Administered 2021-01-22: 3 [IU] via SUBCUTANEOUS
  Administered 2021-01-22 – 2021-01-24 (×7): 2 [IU] via SUBCUTANEOUS
  Administered 2021-01-24 (×2): 3 [IU] via SUBCUTANEOUS
  Administered 2021-01-24: 2 [IU] via SUBCUTANEOUS
  Administered 2021-01-24: 3 [IU] via SUBCUTANEOUS
  Administered 2021-01-24 – 2021-01-25 (×6): 2 [IU] via SUBCUTANEOUS
  Administered 2021-01-25: 3 [IU] via SUBCUTANEOUS
  Administered 2021-01-26 (×6): 2 [IU] via SUBCUTANEOUS
  Administered 2021-01-27 (×2): 3 [IU] via SUBCUTANEOUS
  Administered 2021-01-27: 2 [IU] via SUBCUTANEOUS
  Administered 2021-01-27 – 2021-01-28 (×5): 3 [IU] via SUBCUTANEOUS
  Administered 2021-01-28: 2 [IU] via SUBCUTANEOUS
  Administered 2021-01-28 (×3): 3 [IU] via SUBCUTANEOUS
  Administered 2021-01-29: 2 [IU] via SUBCUTANEOUS
  Administered 2021-01-29: 3 [IU] via SUBCUTANEOUS
  Administered 2021-01-29 (×2): 2 [IU] via SUBCUTANEOUS
  Administered 2021-01-29 (×2): 3 [IU] via SUBCUTANEOUS
  Administered 2021-01-30 (×2): 2 [IU] via SUBCUTANEOUS
  Administered 2021-01-30: 3 [IU] via SUBCUTANEOUS
  Administered 2021-01-30: 2 [IU] via SUBCUTANEOUS
  Administered 2021-01-30: 3 [IU] via SUBCUTANEOUS
  Administered 2021-01-31 (×2): 2 [IU] via SUBCUTANEOUS
  Administered 2021-01-31: 3 [IU] via SUBCUTANEOUS
  Administered 2021-01-31: 2 [IU] via SUBCUTANEOUS
  Administered 2021-01-31 – 2021-02-01 (×3): 3 [IU] via SUBCUTANEOUS
  Administered 2021-02-01: 2 [IU] via SUBCUTANEOUS
  Administered 2021-02-01: 3 [IU] via SUBCUTANEOUS
  Administered 2021-02-01: 2 [IU] via SUBCUTANEOUS
  Administered 2021-02-01 – 2021-02-03 (×10): 3 [IU] via SUBCUTANEOUS
  Filled 2020-12-25 (×158): qty 1

## 2020-12-25 MED ORDER — VERAPAMIL HCL 2.5 MG/ML IV SOLN
INTRAVENOUS | Status: AC
  Filled 2020-12-25: qty 2

## 2020-12-25 MED ORDER — LIDOCAINE HCL (PF) 1 % IJ SOLN
INTRAMUSCULAR | Status: AC
  Filled 2020-12-25: qty 30

## 2020-12-25 MED ORDER — MIDAZOLAM HCL 2 MG/2ML IJ SOLN: 2.0000 mg | INTRAMUSCULAR | Status: DC | PRN

## 2020-12-25 MED ORDER — FAMOTIDINE IN NACL 20-0.9 MG/50ML-% IV SOLN
20.0000 mg | Freq: Two times a day (BID) | INTRAVENOUS | Status: DC
  Administered 2020-12-25: 20 mg via INTRAVENOUS
  Filled 2020-12-25: qty 50

## 2020-12-25 MED ORDER — VASOPRESSIN 20 UNITS/100 ML INFUSION FOR SHOCK
0.0000 [IU]/min | INTRAVENOUS | Status: DC
  Filled 2020-12-25: qty 100

## 2020-12-25 MED ORDER — VERAPAMIL HCL 2.5 MG/ML IV SOLN
INTRAVENOUS | Status: DC | PRN
  Administered 2020-12-25: 2.5 mg via INTRA_ARTERIAL

## 2020-12-25 MED ORDER — IOHEXOL 300 MG/ML  SOLN
INTRAMUSCULAR | Status: DC | PRN
  Administered 2020-12-25: 110 mL

## 2020-12-25 MED ORDER — NOREPINEPHRINE BITARTRATE 1 MG/ML IV SOLN
INTRAVENOUS | Status: AC | PRN
  Administered 2020-12-25: 5 ug/min via INTRAVENOUS

## 2020-12-25 MED ORDER — FENTANYL 2500MCG IN NS 250ML (10MCG/ML) PREMIX INFUSION
0.0000 ug/h | INTRAVENOUS | Status: DC
Start: 2020-12-26 — End: 2020-12-27
  Administered 2020-12-26 (×2): 300 ug/h via INTRAVENOUS
  Filled 2020-12-25: qty 250

## 2020-12-25 MED ORDER — VECURONIUM BROMIDE 10 MG IV SOLR: 10.0000 mg | INTRAVENOUS | Status: DC | PRN

## 2020-12-25 SURGICAL SUPPLY — 18 items
CATH INFINITI 5 FR JL3.5 (CATHETERS) ×2 IMPLANT
CATH INFINITI 5FR ANG PIGTAIL (CATHETERS) ×2 IMPLANT
CATH INFINITI JR4 5F (CATHETERS) ×2 IMPLANT
CATH SWAN GANZ VIP 7.5F (CATHETERS) ×2 IMPLANT
CATH-GARD ARROW CATH SHIELD (MISCELLANEOUS) ×2 IMPLANT
COVER EZ STRL 42X30 (DRAPES) ×2 IMPLANT
DEVICE RAD TR BAND REGULAR (VASCULAR PRODUCTS) ×2 IMPLANT
DRAPE BRACHIAL (DRAPES) ×2 IMPLANT
GLIDESHEATH SLEND A-KIT 6F 22G (SHEATH) ×2 IMPLANT
GLIDESHEATH SLEND SS 6F .021 (SHEATH) ×2 IMPLANT
GUIDEWIRE INQWIRE 1.5J.035X260 (WIRE) ×1 IMPLANT
INQWIRE 1.5J .035X260CM (WIRE) ×2 IMPLANT
KIT ENCORE 26 ADVANTAGE (KITS) IMPLANT
KIT MANI 3VAL PERCEP (MISCELLANEOUS) ×2 IMPLANT
NEEDLE PERC 18GX7CM (NEEDLE) ×2 IMPLANT
PACK CARDIAC CATH (CUSTOM PROCEDURE TRAY) ×2 IMPLANT
SHEATH BRITE TIP 8FRX11 (SHEATH) ×2 IMPLANT
SHIELD CATHGARD ARROW (MISCELLANEOUS) ×1 IMPLANT

## 2020-12-25 NOTE — ED Notes (Signed)
Cardiology at bedside.

## 2020-12-25 NOTE — Procedures (Signed)
Arterial Catheter Insertion Procedure Note  Jeremy Browning  [id number]  April 09, 1961  Date:12/25/20  Time:8:28 PM    Provider Performing: Loraine Leriche    Procedure: Insertion of Arterial Line (79038) with US guidance (33383)   Indication(s) Blood pressure monitoring and/or need for frequent ABGs  Consent Unable to obtain consent due to emergent nature of procedure.  Anesthesia None   Time Out Verified patient identification, verified procedure, site/side was marked, verified correct patient position, special equipment/implants available, medications/allergies/relevant history reviewed, required imaging and test results available.   Sterile Technique Maximal sterile technique including full sterile barrier drape, hand hygiene, sterile gown, sterile gloves, mask, hair covering, sterile ultrasound probe cover (if used).   Procedure Description Area of catheter insertion was cleaned with chlorhexidine and draped in sterile fashion. With real-time ultrasound guidance an arterial catheter was placed into the left radial artery.  Appropriate arterial tracings confirmed on monitor.     Complications/Tolerance None; patient tolerated the procedure well.   EBL minimal   Specimen(s) None

## 2020-12-25 NOTE — Procedures (Signed)
Central Venous Catheter Insertion Procedure Note  Jeremy Browning  [id number]  06/03/1961  Date:12/25/20  Time:8:23 PM   Provider Performing:Rhemi Balbach [name]   Procedure: Insertion of Non-tunneled Central Venous [phone]) with US guidance ([id number])   Indication(s) Medication administration and Difficult access  Consent Risks of the procedure as well as the alternatives and risks of each were explained to the patient and/or caregiver.  Consent for the procedure was obtained and is signed in the bedside chart  Anesthesia sedated  Timeout Verified patient identification, verified procedure, site/side was marked, verified correct patient position, special equipment/implants available, medications/allergies/relevant history reviewed, required imaging and test results available.  Sterile Technique Maximal sterile technique including full sterile barrier drape, hand hygiene, sterile gown, sterile gloves, mask, hair covering, sterile ultrasound probe cover (if used).  Procedure Description Area of catheter insertion was cleaned with chlorhexidine and draped in sterile fashion.  Without real-time ultrasound guidance a central venous catheter was placed into the right internal jugular vein. Nonpulsatile blood flow and easy flushing noted in all ports.  The catheter was sutured in place and sterile dressing applied.  Complications/Tolerance None; patient tolerated the procedure well. Chest X-ray is ordered to verify placement for internal jugular or subclavian cannulation.   Chest x-ray is not ordered for femoral cannulation.  EBL Minimal  Specimen(s) None  Triple lumen catheter threaded through already existing introducer port originally placed for swan catheter

## 2020-12-25 NOTE — Progress Notes (Signed)
Patients clothes are underneath him cut off, and shoes were placed in patient belonging bag

## 2020-12-25 NOTE — Brief Op Note (Signed)
BRIEF CARDIAC CATHETERIZATION NOTE  12/25/2020 6:28 PM   SURGEON:  Surgeon(s) and Role:    * Leonie Man, MD - Primary  PROCEDURE:  Procedure(s): RIGHT AND LEFT HEART CATH AND CORONARY ANGIOGRAPHY (N/A)   PATIENT:  Jeremy Browning  60 y.o. male with history of known mild cardiomyopathy/chronic combined heart failure on carvedilol, losartan and furosemide along with DM-2 on glipizide with longstanding venous stasis with nonhealing wound on the left leg.  He presented to [facility] ED via EMS in acute combined hypoxic and hypercapnic respiratory failure.  Became obtunded upon arrival and was urgently intubated, then digressed into ventricular tachycardia requiring short-term CPR and ROSC with initial EKG suggestive of global ischemia, second EKG had possible LBBB versus a IVCD.  Unsure of his baseline. Based on his known cardiac history we decided the best course of action was to proceed with right and left heart catheterization to exclude ischemic CAD and to assess his heart failure status.  He was hypercapnic and acidemic with a pH of 7.1.  PRE-OPERATIVE DIAGNOSIS: Cardiopulmonary Arrest  POST-OPERATIVE DIAGNOSIS:   Angiographically normal coronary arteries with no obvious culprit lesion to explain heart failure this level of hypoxia or hypercapnia.  Acute combined systolic and diastolic heart failure: EF of roughly 40 to 45% with global hypokinesis: PCWP and LVEDP of 30 mmHg.    Cardiac Output and Index relatively normal: CO 6.54, CI 2.82 (mildly reduced).  AoP-MAP 97/59 mmHg - 71 mmHg; LVP-EDP 101/21-30 mmHg.;  PCWP 30 mmHg, PAP 47/17 mmHg-mean 33 mmHg.  RAP mean 22 mmHg.  Secondary Pulmonary Hypertension  Time Out: Verified patient identification, verified procedure, site/side was marked, verified correct patient position, special equipment/implants available, medications/allergies/relevent history reviewed, required imaging and test results available. Performed.  Access:  . Right  ulnar Artery: 6 Fr sheath -- Seldinger technique using Micropuncture Kit . Direct ultrasound guidance used.  Permanent image obtained and placed on chart. . 10 mL radial cocktail IA; 8000 Units IV Heparin  . * Right internal jugular vein: 8 French sheath-modified Seldinger technique  Direct ultrasound guidance used.  Permanent image obtained and placed on chart.  Right Heart Catheterization: 7.5 Fr Swan Ganz catheter advanced under fluoroscopy with balloon inflated to the RA, RV, then PCWP-PA for hemodynamic measurement.  Advanced to just over 55 cm.  Protective sleeve advanced to the hub. . Simultaneous FA & PA blood gases checked for SaO2% to calculate FICK CO/CI   . Left Heart Catheterization: 5 Fr Catheters advanced or exchanged over a J-wire under direct fluoroscopic guidance into the ascending aorta; JR4 catheter advanced first.  . * LV Hemodynamics (LV Gram): Both JR4 and angled pigtail catheters . * Left Coronary Artery Cineangiography: JL 3.5 catheter  . * Right Coronary Artery Cineangiography: JR4 catheter   Upon completion of Angiogaphy, the catheter was removed completely out of the body over a wire, without complication.  Radial sheath sutured in place, will be uses A-line until A-line established Right IJ sheath sutured into place: To maintain Swan-Ganz catheter in place, patient will likely be transferred to upper level hospital care, but for now we will monitor at [facility].  MEDICATIONS * SQ Lidocaine 38m-for both ulnar and right IJ * Radial Cocktail: 3 mg Verapmil in 10 mL NS * Isovue Contrast: 110 mL * Heparin: 8000 units  ANESTHESIA:   Patient was on propofol drip, also given 50 of fentanyl and 1 mg Versed.  EBL:  <20 mL  SPECIMEN: Arterial and pulm arterial blood samples  for Ao sat and PA sat  DICTATION: .Note written in EPIC  PLAN OF CARE: Admit to inpatient -Anticipate transfer to higher level care.  Started on IV Levophed prior to transfer.  May consider  milrinone to assist with diuresis.  Aggressive IV diuresis.  Oxygenation ventilation per PCCM.  PATIENT DISPOSITION:  ICU - intubated and critically ill.   Delay start of Pharmacological VTE agent (>24hrs) due to surgical blood loss or risk of bleeding: not applicable  Glenetta Hew, MD

## 2020-12-25 NOTE — ED Provider Notes (Signed)
[facility] Emergency Department Provider Note  ____________________________________________  Time seen: Approximately 2:52 PM  I have reviewed the triage vital signs and the nursing notes.   HISTORY  Chief Complaint Cardiac Arrest  Patient unresponsive, then into cardiac arrest.  Unable to provide any history.  History is provided by EMS  HPI Jeremy Browning is a 60 y.o. male who presented to the emergency department via EMS in respiratory distress.  EMS was called out due to decreasing mental status, chest pain, shortness of breath.  They arrived to find the patient very diaphoretic, leaning over a trash can with obvious respiratory distress.  Department arrived with O2 sats in the 50s and 60s, this only improved into the 70s and 80s with nonrebreather.  Patient was then transitioned to CPAP.  Patient was transported emergency traffic to the hospital.  They were unable to establish intravenous access but had the patient on CPAP.  Patient had rapidly declining mental status through transport and arrived relatively nonresponsive to ED staff.  Majority of history is provided by EMS.  Patient has a history of CHF.  Unsure of any other significant medical issues at this time.  Patient had frothy sputum identified out of his mouth behind CPAP.  During transport, patient became unresponsive, went agonal respirations.  Despite CPAP this was removed and patient was started with bagging.  Patient subsequently lost pulse as well and CPR was started.         Past Medical History:  Diagnosis Date  . Diabetes mellitus type II, non insulin dependent (HCC)   . Essential hypertension Jeremy Browning like  . Heart failure (HCC)    Unknown details.  By report no cardiac surgery  . Venous stasis dermatitis of left lower extremity     Patient Active Problem List   Diagnosis Date Noted  . Cardiogenic shock (HCC) 12/25/2020  . Cardiopulmonary arrest with successful resuscitation (HCC)  12/25/2020  . Cardiac arrest with successful resuscitation (HCC)   . Flash pulmonary edema (HCC)       Prior to Admission medications   Not on File    Allergies Patient has no allergy information on record.  Family History  Family history unknown: Yes    Social History     Review of Systems  Constitutional: No reported fever/chills Cardiovascular: Unsure chest pain. Respiratory: Positive for shortness of breath/respiratory distress Gastrointestinal: Abdominal distention Neurological: Altered mental status  10 System ROS otherwise negative.  ____________________________________________   PHYSICAL EXAM:  VITAL SIGNS: ED Triage Vitals  Enc Vitals Group     BP      Pulse      Resp      Temp      Temp src      SpO2      Weight      Height      Head Circumference      Peak Flow      Pain Score      Pain Loc      Pain Edu?      Excl. in GC?      Constitutional: Alert and oriented. Well appearing and in no acute distress. Eyes: Conjunctivae are normal. PERRL. EOMI. Head: Atraumatic. ENT:      Ears:       Nose: No congestion/rhinnorhea.      Mouth/Throat: Mucous membranes are moist.  Patient with copious frothy sputum consistent with flash pulmonary edema identified in the mouth Neck: No stridor.    Cardiovascular: Irregular  rhythm on palpation.. Normal S1 and S2.  Poor peripheral circulation Respiratory: Agonal respirations and severe respiratory distress with frothy sputum in the mouth consistent with flash pulmonary edema.  Lungs with decreased lower breath sounds.  Rhonchi through remainder of lung sounds..  Gastrointestinal: Abdominal distention with bowel sounds x4 quadrants Musculoskeletal: Full range of motion to all extremities. No gross deformities appreciated. Neurologic:  Normal speech and language. No gross focal neurologic deficits are appreciated.  Skin:  Skin is warm, dry and intact. No rash noted. Psychiatric: Mood and affect are normal.  Speech and behavior are normal. Patient exhibits appropriate insight and judgement.   ____________________________________________   LABS (all labs ordered are listed, but only abnormal results are displayed)  Labs Reviewed  BRAIN NATRIURETIC PEPTIDE - Abnormal; Notable for the following components:      Result Value   B Natriuretic Peptide 274.6 (*)    All other components within normal limits  LACTIC ACID, PLASMA - Abnormal; Notable for the following components:   Lactic Acid, Venous 7.8 (*)    All other components within normal limits  CBC WITH DIFFERENTIAL/PLATELET - Abnormal; Notable for the following components:   WBC 17.7 (*)    MCH 25.0 (*)    RDW 16.8 (*)    Lymphs Abs 11.1 (*)    Abs Immature Granulocytes 0.17 (*)    All other components within normal limits  URINALYSIS, COMPLETE (UACMP) WITH MICROSCOPIC - Abnormal; Notable for the following components:   Color, Urine YELLOW (*)    APPearance CLOUDY (*)    Hgb urine dipstick LARGE (*)    Protein, ur 100 (*)    RBC / HPF >50 (*)    All other components within normal limits  BLOOD GAS, ARTERIAL - Abnormal; Notable for the following components:   pH, Arterial 7.02 (*)    pCO2 arterial 63 (*)    pO2, Arterial 69 (*)    Bicarbonate 16.3 (*)    Acid-base deficit 15.2 (*)    All other components within normal limits  COMPREHENSIVE METABOLIC PANEL - Abnormal; Notable for the following components:   Potassium 5.5 (*)    Glucose, Bld 267 (*)    BUN 21 (*)    Creatinine, Ser 2.16 (*)    Calcium 8.0 (*)    Albumin 3.2 (*)    AST 85 (*)    ALT 58 (*)    GFR, Estimated 34 (*)    All other components within normal limits  BLOOD GAS, ARTERIAL - Abnormal; Notable for the following components:   pH, Arterial 7.00 (*)    pCO2 arterial 93 (*)    pO2, Arterial 50 (*)    Acid-base deficit 10.3 (*)    All other components within normal limits  BLOOD GAS, ARTERIAL - Abnormal; Notable for the following components:   pH,  Arterial 7.09 (*)    pCO2 arterial 72 (*)    pO2, Arterial 62 (*)    Acid-base deficit 9.1 (*)    All other components within normal limits  GLUCOSE, CAPILLARY - Abnormal; Notable for the following components:   Glucose-Capillary 180 (*)    All other components within normal limits  URINE DRUG SCREEN, QUALITATIVE ([facility] ONLY) - Abnormal; Notable for the following components:   Opiate, Ur Screen POSITIVE (*)    Benzodiazepine, Ur Scrn POSITIVE (*)    All other components within normal limits  BLOOD GAS, ARTERIAL - Abnormal; Notable for the following components:   pH, Arterial 7.27 (*)  pO2, Arterial 67 (*)    Bicarbonate 19.3 (*)    Acid-base deficit 7.3 (*)    All other components within normal limits  PHOSPHORUS - Abnormal; Notable for the following components:   Phosphorus 7.9 (*)    All other components within normal limits  POTASSIUM - Abnormal; Notable for the following components:   Potassium 5.5 (*)    All other components within normal limits  CBG MONITORING, ED - Abnormal; Notable for the following components:   Glucose-Capillary 277 (*)    All other components within normal limits  TROPONIN I (HIGH SENSITIVITY) - Abnormal; Notable for the following components:   Troponin I (High Sensitivity) 310 (*)    All other components within normal limits  TROPONIN I (HIGH SENSITIVITY) - Abnormal; Notable for the following components:   Troponin I (High Sensitivity) 2,479 (*)    All other components within normal limits  RESP PANEL BY RT-PCR (FLU A&B, COVID) ARPGX2  MRSA PCR SCREENING  SARS CORONAVIRUS 2 (TAT 6-24 HRS)  CULTURE, BLOOD (ROUTINE X 2)  CULTURE, BLOOD (ROUTINE X 2)  LACTIC ACID, PLASMA  PROTIME-INR  APTT  PROCALCITONIN  MAGNESIUM  PATHOLOGIST SMEAR REVIEW  STREP PNEUMONIAE URINARY ANTIGEN  LEGIONELLA PNEUMOPHILA SEROGP 1 UR AG  CBC  BLOOD GAS, ARTERIAL  MAGNESIUM  PHOSPHORUS  HIV ANTIBODY (ROUTINE TESTING W REFLEX)  PROCALCITONIN  COMPREHENSIVE  METABOLIC PANEL  POCT ACTIVATED CLOTTING TIME   ____________________________________________  EKG   ____________________________________________  RADIOLOGY I personally viewed and evaluated these images as part of my medical decision making, as well as reviewing the written report by the radiologist.  ED Provider Interpretation: Significant pulmonary edema noted. 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  LV End Diastolic Pressure and Pulmonary Capillary Wedge Pressure are both severely elevated.  There is moderate left ventricular systolic dysfunction. The left ventricular ejection fraction is 35-45% by visual estimate.  Respiratory acidosis with pH of 7.0 and PCO2 of 93.  POST-OPERATIVE DIAGNOSIS:  Angiographically normal coronary arteries with no obvious culprit lesion to explain heart failure this level of hypoxia or hypercapnia.  Acute combined systolic and diastolic heart failure: EF of roughly 40 to 45% with global hypokinesis: PCWP and LVEDP of 30 mmHg.  ? Cardiac Output and Index relatively normal: CO 6.54, CI 2.82 (mildly reduced). ? AoP-MAP 97/59 mmHg - 71 mmHg; LVP-EDP 101/21-30 mmHg.;  PCWP 30 mmHg, PAP 47/17 mmHg-mean 33 mmHg.  RAP mean 22 mmHg. ? Secondary Pulmonary Hypertension RECOMMENDATIONS  Patient needs ICU care with aggressive pulmonary management to improve oxygenation and ventilation.  The presence of acute renal failure and lactic acidosis precludes [facility] per Dr. Gala Romney.  He recommends aggressive pulmonary management and diuresis.  May require milrinone.  Agree with Levophed for blood pressure assistance. Major concern is ocation of care-he likely will need to be transferred to higher level care center.  Currently working on bed availability at either [facility] or [facility]. Bryan Lemma, MD  DG Chest Port 1  View  Result Date: 12/25/2020 CLINICAL DATA:  60 year old male with central line placement. EXAM: PORTABLE CHEST 1 VIEW COMPARISON:  Chest radiograph dated 12/25/2020. FINDINGS: Endotracheal tube above the carina in similar position. Enteric tube extends below the diaphragm with tip in the proximal stomach. There has been retraction of the Swan-Ganz catheter with tip of the catheter now over the upper SVC. Diffuse bilateral pulmonary opacities similar  or slightly improved on the right. A small left pleural effusion may be present. No pneumothorax. Stable cardiomegaly. No acute osseous pathology. IMPRESSION: 1. Interval retraction of the Swan-Ganz catheter with tip now over the upper SVC. 2. Diffuse bilateral pulmonary opacities, similar or slightly improved on the right. Electronically Signed   By: Elgie Collard M.D.   On: 12/25/2020 21:20   DG Chest Port 1 View  Result Date: 12/25/2020 CLINICAL DATA:  Shortness of breath, status post Swan-Ganz placement EXAM: PORTABLE CHEST 1 VIEW COMPARISON:  Film from earlier in the same day. FINDINGS: Cardiac shadow is stable. Swan-Ganz catheter is noted via the right jugular approach with the tip in the right pulmonary artery. Endotracheal tube and gastric catheter are noted in satisfactory position. Persistent bilateral airspace opacity is noted right considerably greater than left likely representing edema. Continued follow-up is recommended. No pneumothorax is noted. IMPRESSION: No pneumothorax following Swan-Ganz placement. The remainder of the exam is stable. Electronically Signed   By: Alcide Clever M.D.   On: 12/25/2020 19:28   DG Chest Portable 1 View  Result Date: 12/25/2020 CLINICAL DATA:  Intubated. Respiratory distress. Status post cardiac arrest. EXAM: PORTABLE CHEST 1 VIEW COMPARISON:  None. FINDINGS: Enlarged cardiac silhouette. Endotracheal tube in satisfactory position. Nasogastric tube extending into stomach with the side hole visible in the proximal  stomach. Extensive bilateral airspace opacity, most pronounced in the right lower lung zone. No pleural fluid. Unremarkable bones. IMPRESSION: 1. Extensive bilateral airspace opacity most pronounced in right lower lung zone. Could represent pulmonary hemorrhage, pneumonia/or alveolar edema. 2. Cardiomegaly. 3. Endotracheal tube satisfactory position Electronically Signed   By: Beckie Salts M.D.   On: 12/25/2020 15:15    ____________________________________________    PROCEDURES  Procedure(s) performed:    .Critical Care Performed by: Racheal Patches, PA-C Authorized by: Racheal Patches, PA-C   Critical care provider statement:    Critical care time (minutes):  59   Critical care time was exclusive of:  Separately billable procedures and treating other patients and teaching time   Critical care was necessary to treat or prevent imminent or life-threatening deterioration of the following conditions:  Cardiac failure and respiratory failure   Critical care was time spent personally by me on the following activities:  Obtaining history from patient or surrogate, re-evaluation of patient's condition, pulse oximetry, ordering and review of radiographic studies, ordering and review of laboratory studies, ordering and performing treatments and interventions, discussions with consultants, evaluation of patient's response to treatment and examination of patient      Medications  propofol (DIPRIVAN) 1000 MG/100ML infusion (40 mcg/kg/min  120 kg Intravenous New Bag/Given 12/25/20 2153)  morphine 2 MG/ML injection (has no administration in time range)  norepinephrine (LEVOPHED) 4-5 MG/250ML-% infusion SOLN (has no administration in time range)  docusate sodium (COLACE) capsule 100 mg (has no administration in time range)  polyethylene glycol (MIRALAX / GLYCOLAX) packet 17 g (has no administration in time range)  heparin injection 5,000 Units (5,000 Units Subcutaneous Given 12/25/20 2144)   famotidine (PEPCID) IVPB 20 mg premix (20 mg Intravenous New Bag/Given 12/25/20 2230)  0.9 %  sodium chloride infusion (has no administration in time range)  norepinephrine (LEVOPHED) 4mg  in premix infusion (20 mcg/min Intravenous New Bag/Given 12/25/20 2112)  Chlorhexidine Gluconate Cloth 2 % PADS 6 each (has no administration in time range)  furosemide (LASIX) 200 mg in dextrose 5 % 100 mL (2 mg/mL) infusion (10 mg/hr Intravenous New Bag/Given 12/25/20 1944)  doxycycline (  VIBRAMYCIN) 100 mg in sodium chloride 0.9 % 250 mL IVPB (100 mg Intravenous New Bag/Given 12/25/20 2143)  ampicillin-sulbactam (UNASYN) 1.5 g in sodium chloride 0.9 % 100 mL IVPB (1.5 g Intravenous New Bag/Given 12/25/20 2149)  docusate (COLACE) 50 MG/5ML liquid 100 mg (100 mg Per Tube Given 12/25/20 2144)  polyethylene glycol (MIRALAX / GLYCOLAX) packet 17 g (has no administration in time range)  fentaNYL (SUBLIMAZE) injection 50 mcg (50 mcg Intravenous Not Given 12/25/20 2157)  fentaNYL in NS (9mcg/ml) infusion-PREMIX (50 mcg/hr Intravenous New Bag/Given 12/25/20 2155)  fentaNYL (SUBLIMAZE) bolus via infusion 50-100 mcg (has no administration in time range)  vasopressin (PITRESSIN) 20 Units in sodium chloride 0.9 % 100 mL infusion-*FOR SHOCK* (has no administration in time range)  midazolam (VERSED) injection 2 mg (has no administration in time range)  vecuronium (NORCURON) injection 10 mg (has no administration in time range)  midazolam (VERSED) 50 mg/50 mL (1 mg/mL) premix infusion (2 mg/hr Intravenous New Bag/Given 12/25/20 2223)  albuterol (PROVENTIL) (2.5 MG/3ML) 0.083% nebulizer solution 2.5 mg (has no administration in time range)  EPINEPHrine (ADRENALIN) 1 MG/10ML injection (1 mg Intravenous Given 12/25/20 1441)  furosemide (LASIX) injection 80 mg (80 mg Intravenous Given 12/25/20 1533)  norepinephrine (LEVOPHED) 4 mg in dextrose 5 % 250 mL (0.016 mg/mL) infusion (15 mcg/min Intravenous Rate/Dose Change 12/25/20 1802)      ____________________________________________   INITIAL IMPRESSION / ASSESSMENT AND PLAN / ED COURSE  Pertinent labs & imaging results that were available during my care of the patient were reviewed by me and considered in my medical decision making (see chart for details).  Review of the [place] CSRS was performed in accordance of the [facility] prior to dispensing any controlled drugs.  Clinical Course as of 12/25/20 2254  Sat Dec 25, 2020  1451 Paged stemi MD to review case, ekg, etc. at this immediate point, I do not see a clear indication for emergent cardiac catheterization but contacting cardiology as the patient did have a witnessed shockable cardiac arrest however this seems to be in the setting of acute respiratory distress and clinical examination seems highly suggestive at this point of flash pulmonary edema as a potential driving factor. [MQ]  1455 Discussed with Dr. Herbie Baltimore (cardiology) [MQ]  1459 Dr. Herbie Baltimore (STEMI doc) reviewed EKG.  [MQ]  1509 STEMI team activated for post-arrest after reviewing case with Dr. Herbie Baltimore [MQ]  [phone] Per sister in law, had been hospitlaized at [facility] but knowns cardiac history. His PCP with in [facility] and his primary hospital is [facility].  [MQ]  1529 About 45 min to EMS call, patient complained of needing ice water, yelled for help and family found in sweating, touble breathing and immediately called 911 per sister in law.  [MQ]  1530 Per sister in law, his brother Barbara Cower helps care for him and lives with him. Tammy Sours goes have a child in [place] who he pays child support for but out of contact for 10 years.  [MQ]  1532 Jeremy Browning - [phone] Jeremy Browning -  [MQ]  1532 Jeremy Browning [phone] [MQ]  [phone] Discussed, escorted patient's brother back to the room.  He did bring the patient's medications, Dr. Herbie Baltimore of cardiology at the bedside as well [MQ]    Clinical Course User Index [MQ] Sharyn Creamer, MD          ----------------------------------------- 3:00 PM on 12/25/2020 -----------------------------------------  Patient arrived emergency traffic via EMS secondary to respiratory distress.  EMS was called out  secondary [place] to respiratory distress that evidently started this morning.  They arrived to find hypoxia with rhonchi.  Patient was confused and became progressively altered throughout their encounter with the patient.  Patient was unable to be maintained on nonrebreather and was started on CPAP.  Patient arrived with frothy sputum identified in the mouth, significant rhonchi in the upper lung field with decreased breath sounds in the lower fields.  Patient had been transferred to our bed, was unresponsive and unable to answer any questions.  Roughly 90 seconds after transitioning to our stretcher, patient went into severe agonal respirations and subsequently lost a pulse.  CPR was immediately started.  Access was obtained with IV and IO.  Patient having significant ongoing frothy sputum.  This was suctioned, patient on bag-valve-mask until patient was successfully intubated.  CPR with epi was administered until patient went into what appeared to be V. tach.  Patient was shocked at 200 J and had good conversion on next pulse check to a sinus rhythm with good peripheral pulses.  Patient remains admitted at this time.       Respiratory distress, cardiac arrest.  Patient presented to the emergency department via EMS emergency traffic from home for complaint of severe respiratory distress.  Patient was unable provide any history and initial history was provided by EMS.  Attending provider, Dr. Fanny BienQuale was also involved in this patient care and was able to contact the patient's sister-in-law for further details.  Patiently patient had been feeling unwell and thirsty earlier today, called out for help and the patient found him in distress, diaphoretic leaning over a trash can with increased respiratory  effort.  911 was called and fire department and EMS evaluated patient on scene.  Patient had significantly decreased oxygen saturation for EMS and fire department.  Unable to maintain O2 saturation with nonrebreather and patient was started on CPAP.  Patient had frothy secretions in the mouth on arrival with extreme respiratory distress.  Patient was altered on arrival and unable to provide information.  When transferring the patient to our stretcher from EMS stretcher, patient lost pulse and became apneic.  Patient was immediately bagged, CPR started.  On 1 of patient's rhythm strip, patient was noted to be in V. tach and was defibrillated.  Patient successfully returned into ROSC.  Patient remains elevated at this time.  Patient has significant amount of frothy and pink-tinged sputum concerning for flash pulmonary edema.  Patient's EKG was showing diffuse ischemic changes and cardiology was consulted.  Given the patient history there was concern that patient still may have left descending lesion and it was recommended that patient go to the Cath Lab.  ____________________________________________  FINAL CLINICAL IMPRESSION(S) / ED DIAGNOSES  Final diagnoses:  Cardiac arrest with successful resuscitation (HCC)  Flash pulmonary edema (HCC)      NEW MEDICATIONS STARTED DURING THIS VISIT:  ED Discharge Orders    None          This chart was dictated using voice recognition software/Dragon. Despite best efforts to proofread, errors can occur which can change the meaning. Any change was purely unintentional.    Racheal PatchesCuthriell, Madhav Mohon D, PA-C 12/25/20 2254    Sharyn CreamerQuale, Mark, MD 01/10/21 [phone]

## 2020-12-25 NOTE — H&P (Addendum)
NAME:  Jeremy Browning, MRN:  [id number], DOB:  12/10/1960, LOS: 0 ADMISSION DATE:  12/25/2020, CONSULTATION DATE:  12/25/20 REFERRING MD:  Dr. Fanny Bien, CHIEF COMPLAINT:  Respiratory distress   History of Present Illness:  60 yo M presenting to [facility] ED from home via EMS with respiratory distress that had been progressively worsening over the course of 12/25/20. Per documentation family stated the patient was in his normal state of health until this morning when he began feeling unwell. He then called out stating he could not breathe. EMS found the patient hypoxic and in distress with SpO2 in the 70's on room air. Patient was place on CPAP, arriving to the ED unresponsive and pulseless. ED course: Upon arrival to ED patient was defibrillated x 1 and received CPR before achieving ROSC. Initial EKG suggestive of global ischemia, second EKG had possible LBBB vs IVCD. Patient was emergently intubated requiring mechanical ventilation. Per ED documentation STEMI team activated in the setting of a witnessed shockable cardiac arrest with clinical exam highly suggestive of pulmonary edema with pink frothy sputum throughout ET tubing.   Initial Vitals: Post arrest the patient was profoundly hypertensive with BP 206/99, Tachycardic at 111, T 99.5, RR 16 & hypoxic with SpO2 at 87% on 100% Fio2 (PaO2: 69). Significant Labs: initial ABG: 7.02/ 63/ 69/ 16.3, BNP 274.6, Troponin 310 > 2479, Lactic acidosis: 7.8 > 1.9, hyperkalemic at 5.5, BUN/Cr: 21/2.16, liver enzymes elevated: AST/ALT 85/ 58, PCT negative <0.10, leukocytosis 17.7.  Vitals normalized with sedation post intubation. Patient received 80 mg Lasix, with CXR showing diffuse pulmonary edema. Once stabilized in the ED, patient brought to cath lab for Right and left heart catheterization. Cath findings consistent with mild secondary Pulmonary HTN. Initially considered for transfer due to concerns for acute cardiac decompensation requiring further intervention, but with  cath findings reassuring patient will be managed at [facility] ICU.  PCCM consulted for admission. 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 Repeat ABG showing a P/F ratio of 67. Plans to prone overnight at 22:00.  Objective   Blood pressure (!) 88/74, pulse 65, temperature 97.6 F (36.4 C), temperature source Oral, resp. rate (!) 28, weight 127.7 kg, SpO2 100 %.    Vent Mode: PRVC FiO2 (%):  [100 %] 100 % Set Rate:  [20 bmp-28 bmp] 28 bmp Vt Set:  [500 mL-550 mL] 550 mL PEEP:  [10 cmH20-14 cmH20] 14 cmH20 Plateau Pressure:  [32 cmH20] 32 cmH20   Intake/Output Summary (Last 24 hours) at 12/25/2020 2027 Last data filed at 12/25/2020 1900 Gross per 24 hour  Intake 106.07 ml  Output --  Net 106.07 ml   Filed Weights   12/25/20 1526 12/25/20 1824  Weight: 120 kg 127.7 kg    Examination: General: Adult male, critically ill, lying in bed intubated & sedated requiring mechanical ventilation, NAD HEENT: MM pink/moist, anicteric, atraumatic, neck supple Neuro: intubated & sedated, unable to follow commands, PERRL +3 CV: s1s2 RRR, NSR (borderline line BBB) on monitor, no r/m/g Pulm: Regular, non labored on Fio2 100% & PEEP 14, breath sounds clear-BUL & diminished-BLL GI: soft, rounded, faint bs x 4 GU: foley in  place with clear yellow urine Skin: LLE on knee non-healing wound- dime size Extremities: warm/dry, pulses + 2 R/P, non-pitting edema noted BLE  Labs/imaging that I have personally reviewed  (right click and "Reselect all SmartList Selections" daily)  EKG Interpretation Date: 12/25/20 EKG Time:  15:34 Rate: 109 Rhythm: ST  QRS Axis: RAD  Intervals: LBBB ST/T Wave abnormalities: STE in anterolateral leads with depression in V4, V5 & V6 Narrative Interpretation: (baseline wander present) Sinus tachycardia with LBBB and anterolateral STE  Na+/ K+: 135/ 5.5 BUN/Cr.: 21/2.16 Serum CO2/ AG: 23/6  Hgb: 14 Troponin: 310 > 2479 BNP: 274.6  WBC/ TMAX: 17.7/ 37.9 Lactic/ PCT: 7.8 > 1.9/ < 0.10  ABG: (most recent) 7.27/ 42/ 67/ 19.3 CXR 12/25/20: Diffuse bilateral pulmonary opacities similar/slightly improved on the right, possible small left pleural effusion.  12/25/20 RHC/LHC >> Normal coronary arteries LVEF 40-45% (recent baseline of 45%) with global hypokinesis: PCWP & LVEDP 30 mmHg  AoP-MAP 97/59 mmHg - 71 mmHg; LVP-EDP 101/21 - 30 mmHg; PCWP 30 mmHg; PAP 47/17 mmHg- mean 33; RAP mean 22 mmHg.  Normal CO: 6.54 and mildly reduced CI: 2.82.  Resolved Hospital Problem list    Assessment & Plan:  Acute Hypoxic / Hypercapnic Respiratory Failure in the setting of Acute combined heart failure & suspected aspiration pneumonia ARDS PMHx: HFrEF - Ventilator settings: PRVC goal of 6 mL/kg, currently 550 TV due to severe acidosis: 100% FiO2, 14 PEEP, continue ventilator support & lung protective strategies - will prone overnight per protocol with the goal of 16 hours before turning supine - Wean PEEP & FiO2 as tolerated, maintain SpO2 > 90% - Head of bed elevated 30 degrees, VAP protocol in place - Plateau pressures less than 30 cm H20  - Intermittent chest x-ray & ABG PRN - Daily WUA with SBT as tolerated  - Ensure adequate pulmonary hygiene  - F/u cultures, trend PCT - Aspiration Pna coverage with unasyn & doxycycline - bronchodilators PRN - PAD protocol in place: continue Fentanyl drip & Propofol drip  Cardiac Arrest 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 exacerbation Mild Pulmonary HTN Cardiogenic Shock PMHx: combined CHF On admission JJK:KXFGH  tachycardia with LBBB and anterolateral STE Chest x-ray: diffuse bilateral opacities concerning for pulmonary edema Troponins:310 > 2479, BNP:274.6 Patient received 80 mg Lasix in ED, s/p RHC & LHC - Lasix drip, diurese as tolerated - monitor BNP, lipid panel - echocardiogram ordered - Continuous cardiac monitoring & CVP monitor Q 4 - Daily weights to assess volume status - Supplemental oxygen as needed, maintain SpO2 > 90% - Cardiology consulted, appreciate input  Lactic Acidosis Suspected sepsis with shock due to suspected Pneumonia Lactic: 7.8 > 1.9, Baseline PCT: <0.10, UA: +Hbg/>50 RBC, CXR: possible RLL pneumonia  - Supplemental oxygen as needed, to maintain SpO2 > 90% - f/u cultures, trend lactic/ PCT - Daily CBC - monitor WBC/ fever curve - f/u Strep pna & legionella - IV antibiotics:unasyn & doxycycline - IVF hydration as needed - Continue vasopressors to maintain MAP< 65: norepinephrine - Strict I/O's: alert provider if UOP < 0.5 mL/kg/hr - Persistent hypotension consider stress dose steroids   Acute Kidney Injury on suspected CKD in the setting of shock Hyperkalemia - 5.5 Baseline Cr: 1.75, Cr on admission: 2.16 - Strict I/O's: alert provider if UOP < 0.5 mL/kg/hr - Daily BMP, replace electrolytes PRN - Avoid nephrotoxic agents as able, ensure adequate renal perfusion - Consult nephrology if iHD or CRRT indicated  - consider renal US  T2DM - CBG monitor Q 4, with SSI coverage - f/u Hemoglobin A1C - follow ICU hypo/hyper-glycemia protocol  Transaminitis in the setting of shock - Trend hepatic function - Consider RUQ [place] - avoid hepatotoxic agents  Venous Stasis dermatitis of LLE - monitor wound, follow skin care protocol - consult WOC PRN  Best practice (right click and "Reselect all SmartList Selections" daily)  Diet:  NPO Pain/Anxiety/Delirium protocol (if indicated): Yes (RASS goal -4) while prone VAP protocol (if indicated): Yes DVT prophylaxis:  Subcutaneous Heparin GI prophylaxis: H2B Glucose control:  SSI Yes Central venous access:  Yes, and it is still needed Arterial line:  Yes, and it is still needed Foley:  Yes, and it is still needed Mobility:  bed rest  PT consulted: N/A Last date of multidisciplinary goals of care discussion 12/25/20 Code Status:  full code Disposition: ICU  Labs   CBC: Recent Labs  Lab 12/25/20 1451  WBC 17.7*  NEUTROABS 5.1  HGB 14.0  HCT 46.6  MCV 83.4  PLT 171    Basic Metabolic Panel: Recent Labs  Lab 12/25/20 1545  NA 135  K 5.5*  CL 106  CO2 23  GLUCOSE 267*  BUN 21*  CREATININE 2.16*  CALCIUM 8.0*   GFR: CrCl cannot be calculated (Unknown ideal weight.). Recent Labs  Lab 12/25/20 1451 12/25/20 1843  WBC 17.7*  --   LATICACIDVEN 7.8* 1.9    Liver Function Tests: Recent Labs  Lab 12/25/20 1545  AST 85*  ALT 58*  ALKPHOS 82  BILITOT 0.6  PROT 7.9  ALBUMIN 3.2*   No results for input(s): LIPASE, AMYLASE in the last 168 hours. No results for input(s): AMMONIA in the last 168 hours.  ABG    Component Value Date/Time   PHART 7.27 (L) 12/25/2020 1943   PCO2ART 42 12/25/2020 1943   PO2ART 67 (L) 12/25/2020 1943   HCO3 19.3 (L) 12/25/2020 1943   ACIDBASEDEF 7.3 (H) 12/25/2020 1943   O2SAT 90.0 12/25/2020 1943     Coagulation Profile: Recent Labs  Lab 12/25/20 1451  INR 1.2    Cardiac Enzymes: No results for input(s): CKTOTAL, CKMB, CKMBINDEX, TROPONINI in the last 168 hours.  HbA1C: No results found for: HGBA1C  CBG: Recent Labs  Lab 12/25/20 1450 12/25/20 1824  GLUCAP 277* 180*    Review of Systems:   UTA- patient intubated and sedated requiring mechanical ventilation  Past Medical History:  He,  has a past medical history of Diabetes mellitus type II, non insulin dependent (HCC), Essential hypertension ([name]), Heart failure (HCC), and Venous stasis dermatitis of left lower extremity.   Surgical History:   Past Surgical History:   Procedure Laterality Date  . Unknown       Social History:      Family History:  His Family history is unknown by patient.   Allergies Not on File   Home Medications  Prior to Admission medications   Not on File     Critical care time: 50 minutes       Betsey Holiday, AGACNP-BC Acute Care Nurse Practitioner [facility]   [phone] / [phone] Please see Amion for pager details.

## 2020-12-25 NOTE — Progress Notes (Signed)
As the team was about to prone this patient, his rhythm changed from NSR/ST with LBBB to Classic Type 2 second degree HB with HR between 35-55 bpm. Discussed with Intensivist, Dr. Katrinka Blazing & cardiologist on-call, Dr. Elease Hashimoto.   Plan to continue to monitor the patient closely. - change propofol to versed drip  - hold off on proning overnight, reassess in the AM   Betsey Holiday, AGACNP-BC Acute Care Nurse Practitioner [facility]   [phone] / [phone] Please see Amion for pager details.

## 2020-12-25 NOTE — Consult Note (Signed)
Cardiology Consultation:   Patient ID: Jeremy Browning MRN: [id number]; DOB: [birth year]  Admit date: 12/25/2020 Date of Consult: 12/25/2020  PCP:  No primary care provider on file.   [facility] Providers Cardiologist:  None        Patient Profile:   Jeremy Browning is a 60 y.o. male with a hx of "heart disease "who is being seen 12/25/2020 for the evaluation of cardiopulmonary arrest at the request of Dr. Allayne Stack.Marland Kitchen  History of Present Illness:   Jeremy Browning is a 60 year old gentleman with with known history of likely CHF based on medications along with diabetes, venous stasis with slow healing wound on the left leg.  He had a prolonged hospitalization at [facility] several years ago, and has been followed up with at [facility] cardiology (unfortunately Care Everywhere not yet active)-brother says that he has been following up every couple months with his PCP and routinely with his cardiologist.  At baseline he is able to go up and down stairs but is not very active.  He is doing his best he can take maintain his blood sugars and blood pressures.  No major issues.  He does not recall him ever having any stents.  He said he was doing fine until this morning he started feeling poorly.  He then called out for help stating that he could not breathe.  Upon EMS arrival, he was agonal breathing with altered mental status.  Shortly upon arrival to [facility] ED, he was intubated for respiratory arrest with frank hypoxic respite failure.  He showed signs of acute pulmonary edema, with frothy sputum throughout the ET tubing after intubation.  He was profoundly hypertensive and tachycardic initially, was then sedated with propofol drip and his pressures have somewhat normalized to being borderline hypotensive.  In the ER he was given 80 mg IV Lasix, and stabilize the best possible on the ventilator.  Having difficulty with oxygenation.  Chest x-ray shows diffuse pulm edema.  Once stabilized in the ER, the plan was to bring him  to the Cath Lab for right and left heart cath based on abnormal EKG findings.  He does not meet STEMI criteria, however given his frank presentation with VTE and pulm edema we felt invasive evaluation was warranted.  We will continue to try to obtain his records from [facility]  Past Medical History:  Diagnosis Date  . Diabetes mellitus type II, non insulin dependent (HCC)   . Essential hypertension Irving Burton like  . Heart failure (HCC)    Unknown details.  By report no cardiac surgery  . Venous stasis dermatitis of left lower extremity     Past Surgical History:  Procedure Laterality Date  . Unknown       Home Medications:  Prior to Admission medications   Not on File  Per my review, he is taking losartan 50 mg daily, carvedilol 12.5 mg twice daily, glipizide, and furosemide 80 mg daily.  Inpatient Medications: Scheduled Meds: . [START ON 12/26/2020] Chlorhexidine Gluconate Cloth  6 each Topical Q0600  . heparin  5,000 Units Subcutaneous Q8H  . morphine       Continuous Infusions: . sodium chloride    . famotidine (PEPCID) IV    . norepinephrine    . norepinephrine (LEVOPHED) Adult infusion    . propofol (DIPRIVAN) infusion 40 mcg/kg/min (12/25/20 1631)   PRN Meds:  Allergies:   Not on File  Social History:   Social History   Socioeconomic History  . Marital status: Not on file  Spouse name: Not on file  . Number of children: Not on file  . Years of education: Not on file  . Highest education level: Not on file  Occupational History  . Not on file  Tobacco Use  . Smoking status: Not on file  . Smokeless tobacco: Not on file  Substance and Sexual Activity  . Alcohol use: Not on file  . Drug use: Not on file  . Sexual activity: Not on file  Other Topics Concern  . Not on file  Social History Narrative   He lives with his brother and the brother's family.  Recently moved here to the [place] area.  Has never been established here at [facility].   Social Determinants of  Health   Financial Resource Strain: Not on file  Food Insecurity: Not on file  Transportation Needs: Not on file  Physical Activity: Not on file  Stress: Not on file  Social Connections: Not on file  Intimate Partner Violence: Not on file    Family History:   Family History  Family history unknown: Yes    The brother does not report any significant family history of CAD or CHF.  ROS:  Please see the history of present illness.  Unable to fully assess as patient is intubated and sedated.  Patient's brother says that he was doing relatively well up until today.  Was not feeling well earlier this morning may be a little weak, and then called out for help prior to EMS being called. All other ROS reviewed and negative.     Physical Exam/Data:   Vitals:   12/25/20 1530 12/25/20 1820 12/25/20 1824 12/25/20 1830  BP: (!) 122/100  (!) 93/54   Pulse: (!) 118     Resp: 17  (!) 25 (!) 28  Temp: 100.3 F (37.9 C)  98 F (36.7 C)   TempSrc:   Axillary   SpO2: (!) 69% 100% 100%   Weight:   127.7 kg    No intake or output data in the 24 hours ending 12/25/20 1846 Last 3 Weights 12/25/2020 12/25/2020  Weight (lbs) 281 lb 8.4 oz 264 lb 8.8 oz  Weight (kg) 127.7 kg 120 kg     There is no height or weight on file to calculate BMI.  General: Obese.  In obvious distress despite being intubated.  Increased work of breathing with frothy fluid in ET tube tubing.   HEENT: normal Lymph: no adenopathy Neck: Thick neck, unable assess JVD, it appears to be elevated. Endocrine:  No thryomegaly Vascular: Palpable pedal and radial pulses. Cardiac: Tachycardic, irregular.  Unable to assess murmurs rubs or gallops because of coarse respiratory breath sounds. Lungs: Diffuse coarse rales and upper respiratory sounds from intubation. Abd: Obese, somewhat distended.  Abdominal breathing. Ext: Left greater than right significant edema from known peripheral venous stasis.  Right side does not show significant  edema. Musculoskeletal:  No deformities Skin: Diffuse sweating, mixed with having urinated. Neuro: Intubated, sedated Psych: Unable to assess  EKG: Initial EKG shows borderline rhythm, possible A. fib versus sinus tachycardia with PVCs, extensive ST depressions in inferior and lateral leads with possible ST elevations in V1 and aVR.  The follow-up EKG was personally reviewed and demonstrates: Sinus tachycardia with IVCD, ST elevation in aVR with borderline elevations in V1 and V2.  ST depression is no longer as apparent in inferior lateral leads, however there is deep T wave inversions likely from the bundle branch block in V5 and V6.  Telemetry:  Telemetry was personally reviewed and demonstrates: Sinus tachycardia with IVCD/bundle branch block.  Relevant CV Studies: Not available.  Chest x-ray shows diffuse pulm edema.  Laboratory Data:  High Sensitivity Troponin:   Recent Labs  Lab 12/25/20 1451 12/25/20 1651  TROPONINIHS 310* 2,479*     Chemistry Recent Labs  Lab 12/25/20 1545  NA 135  K 5.5*  CL 106  CO2 23  GLUCOSE 267*  BUN 21*  CREATININE 2.16*  CALCIUM 8.0*  GFRNONAA 34*  ANIONGAP 6    Recent Labs  Lab 12/25/20 1545  PROT 7.9  ALBUMIN 3.2*  AST 85*  ALT 58*  ALKPHOS 82  BILITOT 0.6   Hematology Recent Labs  Lab 12/25/20 1451  WBC 17.7*  RBC 5.59  HGB 14.0  HCT 46.6  MCV 83.4  MCH 25.0*  MCHC 30.0  RDW 16.8*  PLT 171   BNP Recent Labs  Lab 12/25/20 1451  BNP 274.6*    DDimer No results for input(s): DDIMER in the last 168 hours.   Radiology/Studies:  DG Chest Portable 1 View  Result Date: 12/25/2020 CLINICAL DATA:  Intubated. Respiratory distress. Status post cardiac arrest. EXAM: PORTABLE CHEST 1 VIEW COMPARISON:  None. FINDINGS: Enlarged cardiac silhouette. Endotracheal tube in satisfactory position. Nasogastric tube extending into stomach with the side hole visible in the proximal stomach. Extensive bilateral airspace opacity, most  pronounced in the right lower lung zone. No pleural fluid. Unremarkable bones. IMPRESSION: 1. Extensive bilateral airspace opacity most pronounced in right lower lung zone. Could represent pulmonary hemorrhage, pneumonia/or alveolar edema. 2. Cardiomegaly. 3. Endotracheal tube satisfactory position Electronically Signed   By: Beckie Salts M.D.   On: 12/25/2020 15:15    Assessment and Plan:   1. Acute hypercapnic and hypoxic respiratory failure likely secondary to flash pulmonary edema 2. Abnormal EKG with initial EKG showing global ischemia, follow-up EKG showing IVCD possible LBBB 3. V. tach arrest  Recommendation for now will be to exclude ischemic CAD with left heart catheterization, pending results we will probably proceed with right heart catheterization to ascertain filling pressures and right-sided pressures. At this point, oxygenation seems to be the major issue.  ABG just checked, not yet available-upon arrival to Cath Lab pH was roughly 7.1 with PCO2 of in the 70s.  (On arterial access, pH was 7.0 with PCO2 of 93 and PaO2 of 65).  He was given 80 mg IV Lasix in the ER, PCCM consulted for assistance with oxygenation. Borderline hypotension with sedation, may require pressor support, will assess in the Cath Lab.  Further plans based on heart catheterization findings.   Risk Assessment/Risk Scores:     TIMI Risk Score for Unstable Angina or Non-ST Elevation MI:   The patient's TIMI risk score is 3, which indicates a 13% risk of all cause mortality, new or recurrent myocardial infarction or need for urgent revascularization in the next 14 days.  New York Heart Association (NYHA) Functional Class NYHA Class IV        For questions or updates, please contact [facility] Please consult [email or web address] for contact info under    Signed, Bryan Lemma, MD  12/25/2020 6:46 PM

## 2020-12-25 NOTE — Progress Notes (Signed)
  Chaplain On-Call responded to the Code STEMI in ED room 16 at 3:15 pm.  Provided spiritual and emotional support and prayer for patient's brother Mansfield Dann at bedside and also in the [place] Waiting Room until 4:15 pm.  Patient was taken urgently to the Cardiac Cath Lab for surgery.  Chaplain returned at 6:00 pm to the [facility] Waiting Room. Chaplain located Dr. Ellyn Hack in the Cath Lab, who met with Nathaniel Man and provided an extensive update about the gravity of the patient's condition.  Chaplain escorted Nathaniel Man to the ICU Waiting Room and notified the Unit Secretary. Jasen provided telephone numbers for himself and his sister, which were eventually received by Nurse Practitioner Rufina Falco. Jasen left for his home to seek to locate additional medical information about his brother as requested by NP Ouma and for Dr. Tamala Julian.  Chaplain accompanied Jasen back to the ED parking area for his departure.  Chaplain Pollyann Samples M.Div., [facility]

## 2020-12-25 NOTE — ED Triage Notes (Signed)
Pt presents to the [facility] via EMS from home with c/o respiratory distress. EMS states that has been experiencing dyspnea since waking this morning and progressively worsened throughout the day. Pt foun in respiratory distress with oxygen saturation in the 70's on room air upon arrival of EMS to residence. Pt was then placed on CPAP. EMS unable to establish IV access en route. Pt became unresponsive with no pulse upon arrival to [facility] room. CPR started.

## 2020-12-25 NOTE — Progress Notes (Signed)
PHARMACY CONSULT NOTE - FOLLOW UP  Pharmacy Consult for Electrolyte Monitoring and Replacement   Recent Labs: Potassium (mmol/L)  Date Value  12/25/2020 5.5 (H)   Calcium (mg/dL)  Date Value  56/38/9373 8.0 (L)   Albumin (g/dL)  Date Value  42/87/6811 3.2 (L)   Sodium (mmol/L)  Date Value  12/25/2020 135  Corrected calcium=8.64   Assessment: 60 yr old male admitted following witnessed cardiac arrest Pt. Is S/P CPR intubation. Pharmacy consulted for electrolyte monitoring and replacement.  Goal of Therapy:  Electrolytes WNL  Plan:  No replacement warranted at this time. Will follow with AM and replace as needed.  Rico Junker ,PharmD Clinical Pharmacist 12/25/2020 6:10 PM

## 2020-12-25 NOTE — ED Provider Notes (Addendum)
Clinical Course as of 12/25/20 1608  Sat Dec 25, 2020  1451 Paged stemi MD to review case, ekg, etc. at this immediate point, I do not see a clear indication for emergent cardiac catheterization but contacting cardiology as the patient did have a witnessed shockable cardiac arrest however this seems to be in the setting of acute respiratory distress and clinical examination seems highly suggestive at this point of flash pulmonary edema as a potential driving factor. [MQ]  1455 Discussed with Dr. Herbie Baltimore (cardiology) [MQ]  1459 Dr. Herbie Baltimore (STEMI doc) reviewed EKG.  [MQ]  1509 STEMI team activated for post-arrest after reviewing case with Dr. Herbie Baltimore [MQ]  [phone] Per sister in law, had been hospitlaized at [facility] but knowns cardiac history. His PCP with in [facility] and his primary hospital is [facility].  [MQ]  1529 About 45 min to EMS call, patient complained of needing ice water, yelled for help and family found in sweating, touble breathing and immediately called 911 per sister in law.  [MQ]  1530 Per sister in law, his brother Barbara Cower helps care for him and lives with him. Tammy Sours goes have a child in [place] who he pays child support for but out of contact for 10 years.  [MQ]  1532 Daisean Brodhead - [phone] Royanne Foots -  [MQ]  1532 Draken Farrior [phone] [MQ]  [phone] Discussed, escorted patient's brother back to the room.  He did bring the patient's medications, Dr. Herbie Baltimore of cardiology at the bedside as well [MQ]    Clinical Course User Index [MQ] Sharyn Creamer, MD   Procedure Name: Intubation Date/Time: 12/25/2020 4:08 PM Performed by: Sharyn Creamer, MD Pre-anesthesia Checklist: Patient identified, Emergency Drugs available, Suction available and Patient being monitored Oxygen Delivery Method: Simple face mask Preoxygenation: Pre-oxygenation with 100% oxygen Ventilation: Mask ventilation with difficulty Laryngoscope Size: Glidescope and 4 Grade View: Grade II Tube type: Subglottic suction  tube Tube size: 7.0 mm Number of attempts: 2 (copious suctioning required) Airway Equipment and Method: Video-laryngoscopy Placement Confirmation: ETT inserted through vocal cords under direct vision,  CO2 detector and Breath sounds checked- equal and bilateral Secured at: 24 cm Tube secured with: Tape Dental Injury: Teeth and Oropharynx as per pre-operative assessment  Difficulty Due To: Difficulty was anticipated Comments: Difficult intubation due to copious foamy white secretions.  CPR was continued, patient able to be successfully intubated on second attempt with minimal delay between intubation attempts.  BVM utilization during CPR.  End-tidal CO2 affirming as well as chest x-ray confirmation.      Cardiopulmonary Resuscitation (CPR) Procedure Note Directed/Performed by: Sharyn Creamer I personally directed ancillary staff and/or performed CPR in an effort to regain return of spontaneous circulation and to maintain cardiac, neuro and systemic perfusion.    CRITICAL CARE Performed by: Sharyn Creamer   Total critical care time: 60 minutes  Critical care time was exclusive of separately billable procedures and treating other patients.  Critical care was necessary to treat or prevent imminent or life-threatening deterioration.  Critical care was time spent personally by me on the following activities: development of treatment plan with patient and/or surrogate as well as nursing, discussions with consultants, evaluation of patient's response to treatment, examination of patient, obtaining history from patient or surrogate, ordering and performing treatments and interventions, ordering and review of laboratory studies, ordering and review of radiographic studies, pulse oximetry and re-evaluation of patient's condition.  CRITICAL CARE Performed by: Sharyn Creamer   Total critical care time: 5 minutes  Critical care  time was exclusive of separately billable procedures and treating other  patients.  Critical care was necessary to treat or prevent imminent or life-threatening deterioration.  Critical care was time spent personally by me on the following activities: development of treatment plan with patient and/or surrogate as well as nursing, discussions with consultants, evaluation of patient's response to treatment, examination of patient, obtaining history from patient or surrogate, ordering and performing treatments and interventions, ordering and review of laboratory studies, ordering and review of radiographic studies, pulse oximetry and re-evaluation of patient's condition.   ----------------------------------------- 4:12 PM on 12/25/2020 -----------------------------------------  Patient to cardiac Cath Lab with both Dr. Herbie Baltimore and ICU physician Dr. Katrinka Blazing at bedside.   Patient's brother and family updated including sister-in-law and brother Barbara Cower.  Patient required extensive critical care time patient had a witnessed cardiac arrest in the emergency department.  Patient required CPR intubation, postresuscitation care, coordination with multiple services including ICU and cardiology, discussion with multiple family members, etc.  Extensive critical care time with multiple minutes at the bedside  Medical screening examination/treatment/procedure(s) were conducted as a shared visit with non-physician practitioner(s) and myself.  I personally evaluated the patient during the encounter.   EKG reviewed inter by me at 1450 Heart rate 99 QRS 120 QTc 550 Unclear underlying rhythm suspect sinus rhythm.  Significant ST segment abnormalities somewhat diffuse, concerning for subendocardial ischemia but also potentially for inferior ischemia and elevation noted in aVR and V1 could be indicative of acute coronary disease but does not meet diagnostic STEMI criteria.  This EKG was discussed directly with Dr. Herbie Baltimore who reviewed  Repeat EKG reviewed at 1535 Heart rate 110 QRS 179  QTc 520 ST segment elevation now noted aVR V1 and V2, but associated with somewhat deep Q waves.  Appears significantly different with regard to voltages as well from previous EKG.  Discussed with Dr. Herbie Baltimore, who is actually reviewing this at the bedside as well.  In this clinical context, concerning for acute myocardial ischemia, but I am hesitant at this point to declare an obvious STEMI        Sharyn Creamer, MD 12/25/20 1719

## 2020-12-26 ENCOUNTER — Inpatient Hospital Stay: Payer: Medicaid Other

## 2020-12-26 DIAGNOSIS — I469 Cardiac arrest, cause unspecified: Secondary | ICD-10-CM | POA: Diagnosis not present

## 2020-12-26 DIAGNOSIS — R570 Cardiogenic shock: Secondary | ICD-10-CM | POA: Diagnosis not present

## 2020-12-26 DIAGNOSIS — N179 Acute kidney failure, unspecified: Secondary | ICD-10-CM | POA: Diagnosis not present

## 2020-12-26 DIAGNOSIS — N189 Chronic kidney disease, unspecified: Secondary | ICD-10-CM | POA: Diagnosis not present

## 2020-12-26 DIAGNOSIS — J810 Acute pulmonary edema: Secondary | ICD-10-CM | POA: Diagnosis not present

## 2020-12-26 DIAGNOSIS — J9601 Acute respiratory failure with hypoxia: Secondary | ICD-10-CM | POA: Diagnosis not present

## 2020-12-26 DIAGNOSIS — I441 Atrioventricular block, second degree: Secondary | ICD-10-CM | POA: Diagnosis not present

## 2020-12-26 LAB — BLOOD CULTURE ID PANEL (REFLEXED) - BCID2

## 2020-12-26 LAB — BLOOD GAS, ARTERIAL
Acid-base deficit: 7.4 mmol/L — ABNORMAL HIGH (ref 0.0–2.0)
Bicarbonate: 17.1 mmol/L — ABNORMAL LOW (ref 20.0–28.0)
FIO2: 1
MECHVT: 550 mL
Mechanical Rate: 26
O2 Saturation: 100 %
PEEP: 14 cmH2O
Patient temperature: 37
RATE: 26 resp/min
pCO2 arterial: 31 mmHg — ABNORMAL LOW (ref 32.0–48.0)
pH, Arterial: 7.35 (ref 7.350–7.450)
pO2, Arterial: 376 mmHg — ABNORMAL HIGH (ref 83.0–108.0)

## 2020-12-26 LAB — COMPREHENSIVE METABOLIC PANEL
ALT: 46 U/L — ABNORMAL HIGH (ref 0–44)
AST: 73 U/L — ABNORMAL HIGH (ref 15–41)
Albumin: 3 g/dL — ABNORMAL LOW (ref 3.5–5.0)
Alkaline Phosphatase: 62 U/L (ref 38–126)
Anion gap: 9 (ref 5–15)
BUN: 27 mg/dL — ABNORMAL HIGH (ref 6–20)
CO2: 18 mmol/L — ABNORMAL LOW (ref 22–32)
Calcium: 7.7 mg/dL — ABNORMAL LOW (ref 8.9–10.3)
Chloride: 108 mmol/L (ref 98–111)
Creatinine, Ser: 2.53 mg/dL — ABNORMAL HIGH (ref 0.61–1.24)
GFR, Estimated: 28 mL/min — ABNORMAL LOW (ref >60)
Glucose, Bld: 160 mg/dL — ABNORMAL HIGH (ref 70–99)
Potassium: 5.4 mmol/L — ABNORMAL HIGH (ref 3.5–5.1)
Sodium: 135 mmol/L (ref 135–145)
Total Bilirubin: 0.8 mg/dL (ref 0.3–1.2)
Total Protein: 7.4 g/dL (ref 6.5–8.1)

## 2020-12-26 LAB — CBC
HCT: 40.7 % (ref 39.0–52.0)
Hemoglobin: 12.8 g/dL — ABNORMAL LOW (ref 13.0–17.0)
MCH: 25 pg — ABNORMAL LOW (ref 26.0–34.0)
MCHC: 31.4 g/dL (ref 30.0–36.0)
MCV: 79.3 fL — ABNORMAL LOW (ref 80.0–100.0)
Platelets: 243 10*3/uL (ref 150–400)
RBC: 5.13 MIL/uL (ref 4.22–5.81)
RDW: 16.8 % — ABNORMAL HIGH (ref 11.5–15.5)
WBC: 13.2 10*3/uL — ABNORMAL HIGH (ref 4.0–10.5)
nRBC: 0 % (ref 0.0–0.2)

## 2020-12-26 LAB — LIPID PANEL
Cholesterol: 149 mg/dL (ref 0–200)
HDL: 36 mg/dL — ABNORMAL LOW (ref >40)
LDL Cholesterol: 95 mg/dL (ref 0–99)
Total CHOL/HDL Ratio: 4.1 RATIO
Triglycerides: 92 mg/dL (ref <150)
VLDL: 18 mg/dL (ref 0–40)

## 2020-12-26 LAB — GLUCOSE, CAPILLARY
Glucose-Capillary: 117 mg/dL — ABNORMAL HIGH (ref 70–99)
Glucose-Capillary: 120 mg/dL — ABNORMAL HIGH (ref 70–99)
Glucose-Capillary: 121 mg/dL — ABNORMAL HIGH (ref 70–99)
Glucose-Capillary: 125 mg/dL — ABNORMAL HIGH (ref 70–99)
Glucose-Capillary: 136 mg/dL — ABNORMAL HIGH (ref 70–99)
Glucose-Capillary: 158 mg/dL — ABNORMAL HIGH (ref 70–99)

## 2020-12-26 LAB — HEMOGLOBIN A1C
Hgb A1c MFr Bld: 5.9 % — ABNORMAL HIGH (ref 4.8–5.6)
Mean Plasma Glucose: 122.63 mg/dL

## 2020-12-26 LAB — HEPATITIS B CORE ANTIBODY, TOTAL: Hep B Core Total Ab: NONREACTIVE

## 2020-12-26 LAB — PROCALCITONIN: Procalcitonin: 33.57 ng/mL

## 2020-12-26 LAB — BLOOD GAS, VENOUS
Acid-base deficit: 7.9 mmol/L — ABNORMAL HIGH (ref 0.0–2.0)
Bicarbonate: 18.3 mmol/L — ABNORMAL LOW (ref 20.0–28.0)
FIO2: 1
MECHVT: 550 mL
O2 Saturation: 68.6 %
PEEP: 14 cmH2O
Patient temperature: 37
RATE: 28 resp/min
pCO2, Ven: 39 mmHg — ABNORMAL LOW (ref 44.0–60.0)
pH, Ven: 7.28 (ref 7.250–7.430)
pO2, Ven: 41 mmHg (ref 32.0–45.0)

## 2020-12-26 LAB — PROTEIN / CREATININE RATIO, URINE
Creatinine, Urine: 159 mg/dL
Protein Creatinine Ratio: 0.13 mg/mg{Cre} (ref 0.00–0.15)
Total Protein, Urine: 20 mg/dL

## 2020-12-26 LAB — HEPATITIS C ANTIBODY: HCV Ab: NONREACTIVE

## 2020-12-26 LAB — HEPATITIS B CORE ANTIBODY, IGM: Hep B C IgM: NONREACTIVE

## 2020-12-26 LAB — HIV ANTIBODY (ROUTINE TESTING W REFLEX): HIV Screen 4th Generation wRfx: NONREACTIVE

## 2020-12-26 LAB — HEPATITIS B SURFACE ANTIGEN: Hepatitis B Surface Ag: NONREACTIVE

## 2020-12-26 LAB — STREP PNEUMONIAE URINARY ANTIGEN: Strep Pneumo Urinary Antigen: NEGATIVE

## 2020-12-26 LAB — PHOSPHORUS: Phosphorus: 4 mg/dL (ref 2.5–4.6)

## 2020-12-26 LAB — BRAIN NATRIURETIC PEPTIDE: B Natriuretic Peptide: 687.3 pg/mL — ABNORMAL HIGH (ref 0.0–100.0)

## 2020-12-26 LAB — MAGNESIUM: Magnesium: 1.7 mg/dL (ref 1.7–2.4)

## 2020-12-26 LAB — HEPATITIS B SURFACE ANTIBODY,QUALITATIVE: Hep B S Ab: NONREACTIVE

## 2020-12-26 IMAGING — DX DG CHEST 1V PORT
1 series · 1 of 1 positions shown · non-contrast
Comparison: Three exams on [DATE].

CLINICAL DATA: respiratory distress that had been progressively
worsening over the course of [DATE]Now in ICU intubatedHx CHF, DM

EXAM:
PORTABLE CHEST 1 VIEW

[chest ap]
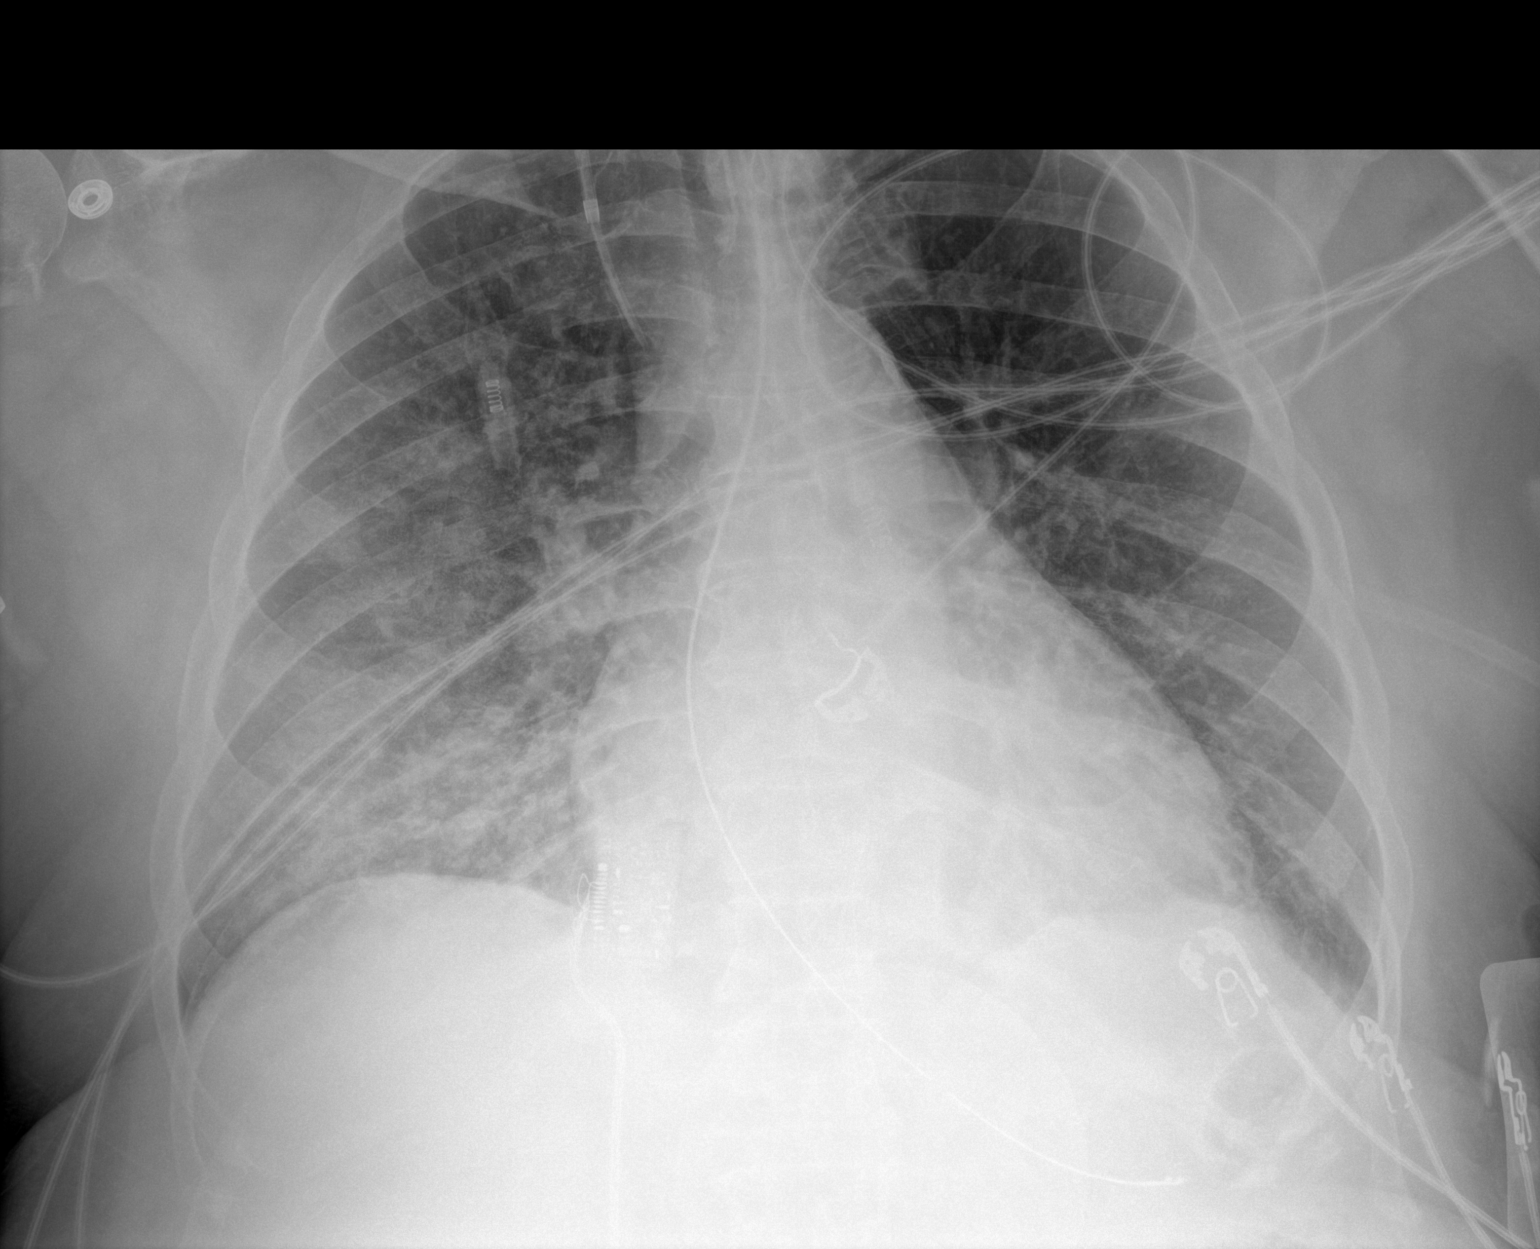

[1 of 1 positions shown; findings below may reference images not displayed]

FINDINGS: Airspace opacities have decreased from the prior exams, particularly
becoming less confluent in the right mid and lower lung. No new lung
abnormalities. No convincing pleural effusion and no pneumothorax.

Endotracheal tube, nasal/orogastric tube and right internal jugular
central venous line are stable.

Heart is normal in size.
IMPRESSION: 1. Improved airspace lung opacities compared to the previous day's
exams, consistent with improved pulmonary edema.
2. Stable support apparatus from the most recent prior study.

## 2020-12-26 IMAGING — US US RENAL
1 series · 14 of 25 positions shown · non-contrast
Comparison: None.

CLINICAL DATA: Acute renal failure

EXAM:
RENAL / URINARY TRACT ULTRASOUND COMPLETE

[Series 1: us renal · 14 of 56 slices shown]
[im 1/56]
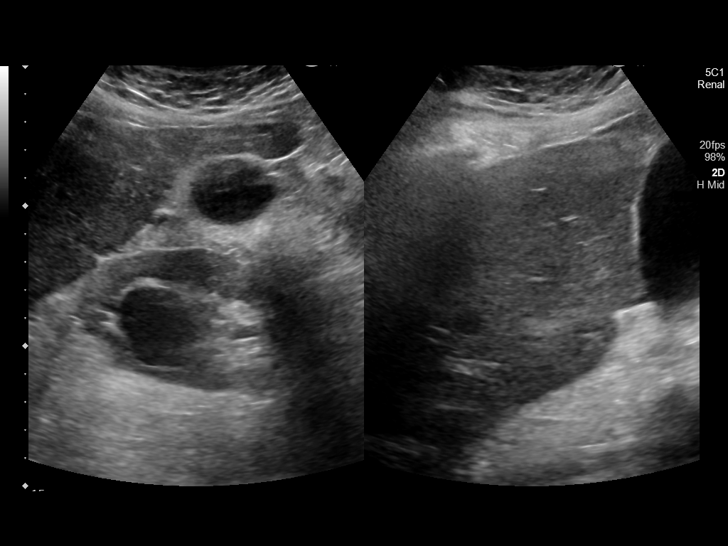
[im 5/56]
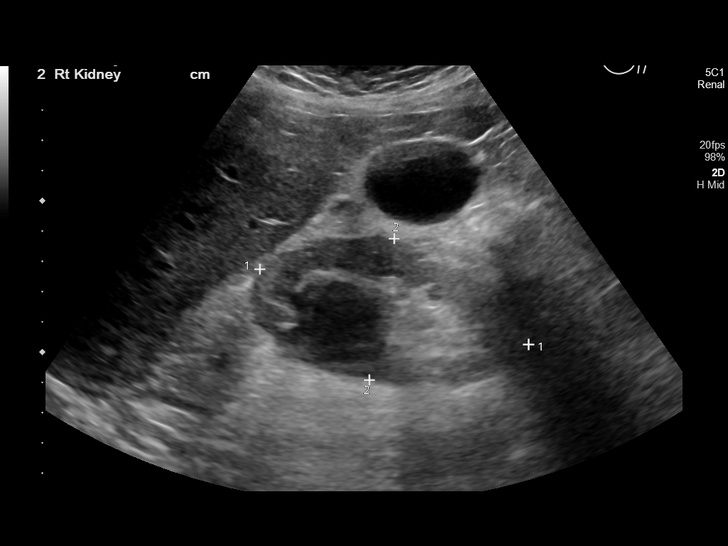
[im 10/56]
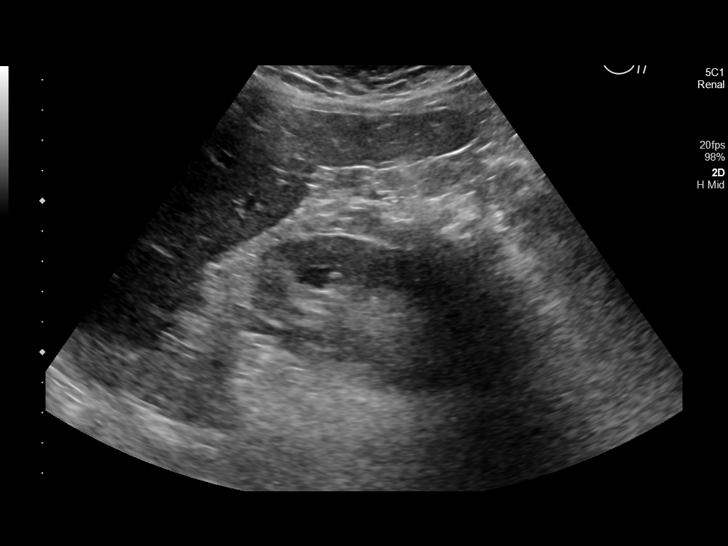
[im 14/56]
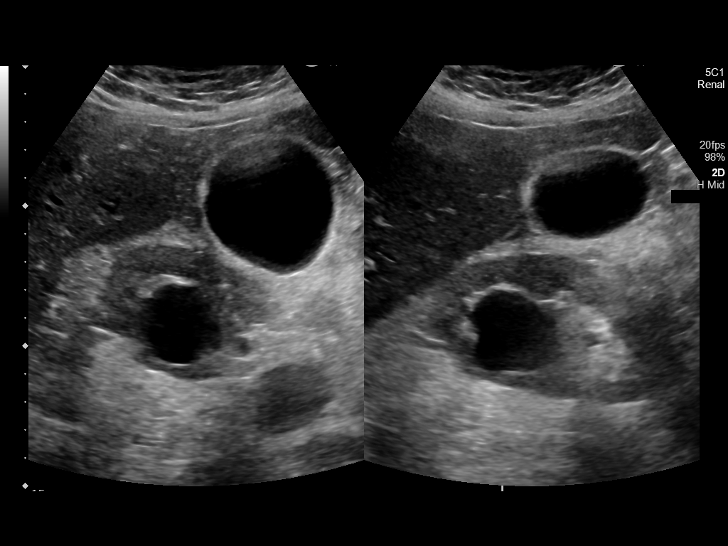
[im 19/56]
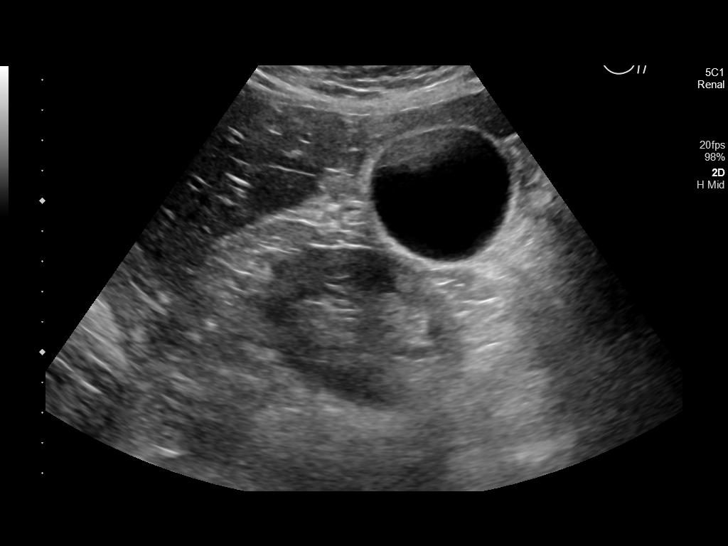
[im 21/56]
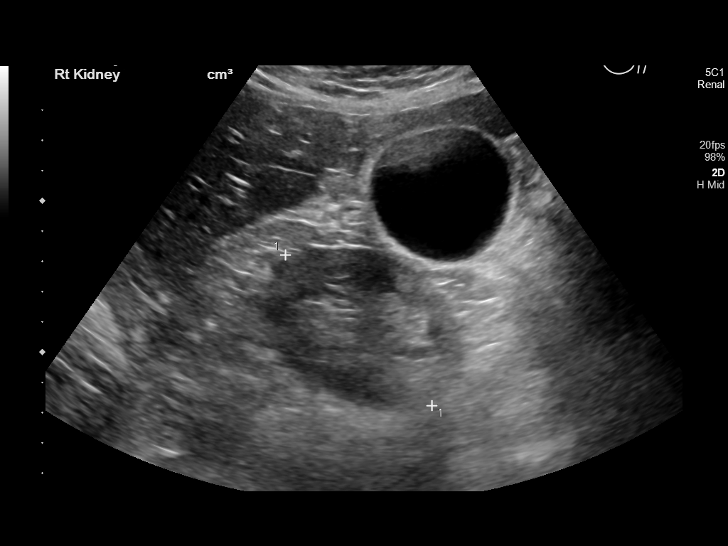
[im 26/56]
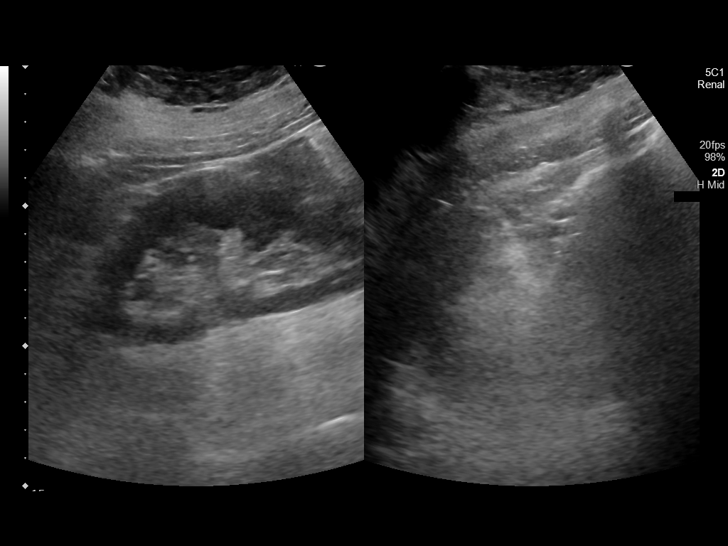
[im 30/56]
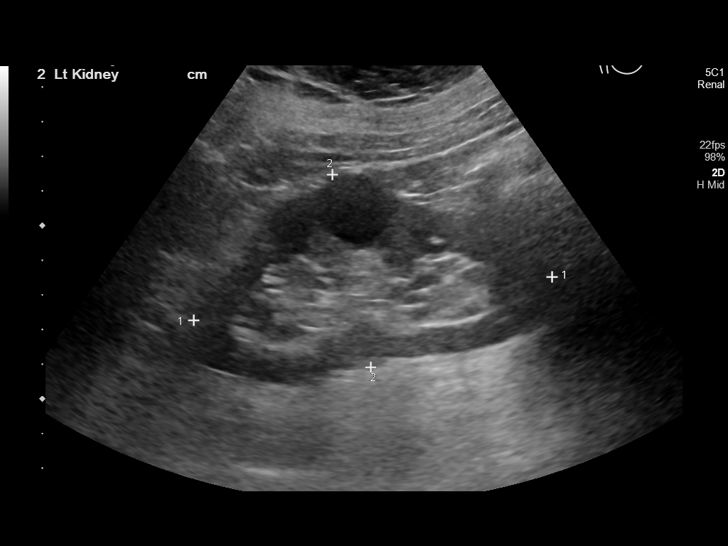
[im 35/56]
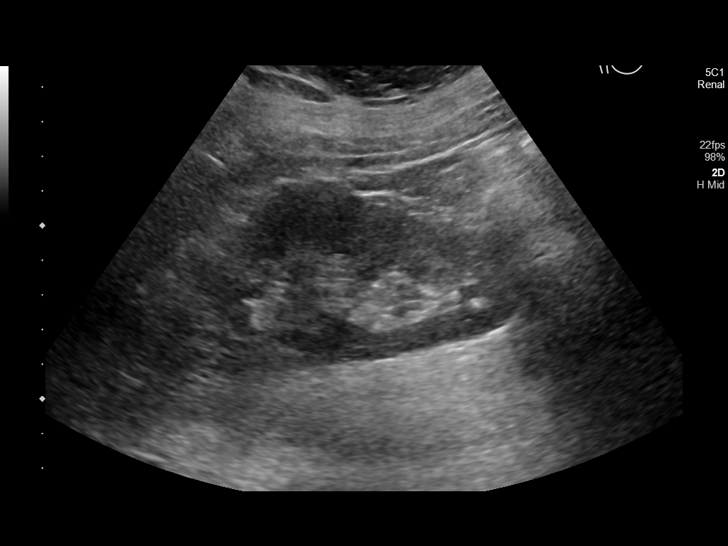
[im 37/56]
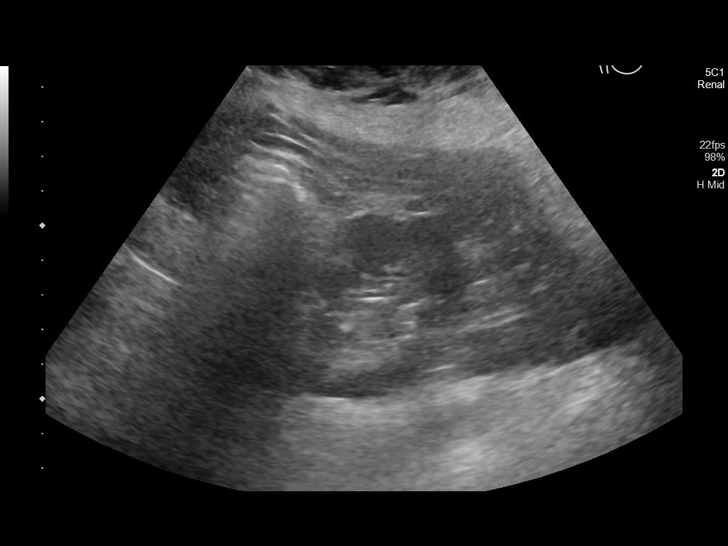
[im 42/56]
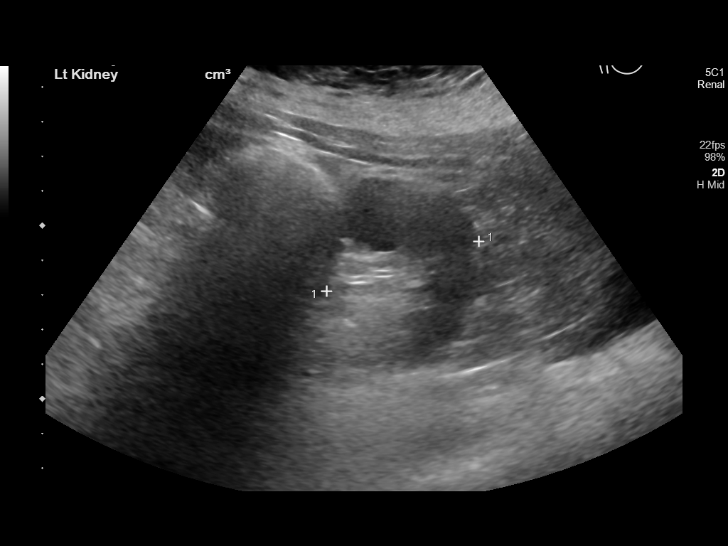
[im 46/56]
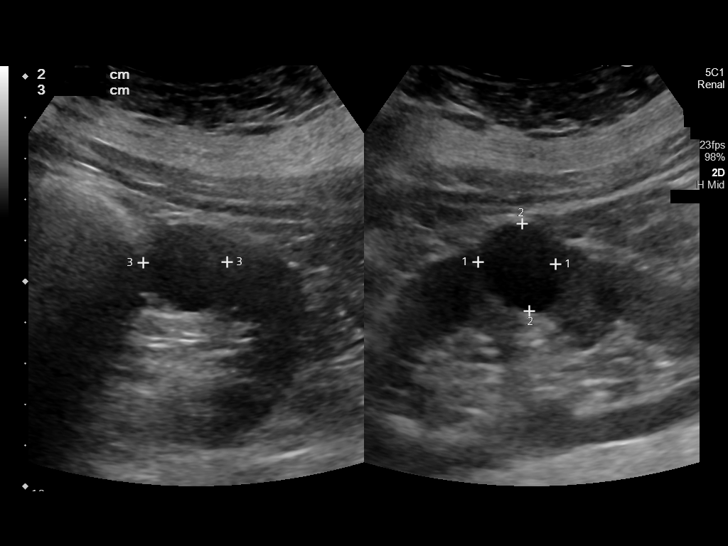
[im 51/56]
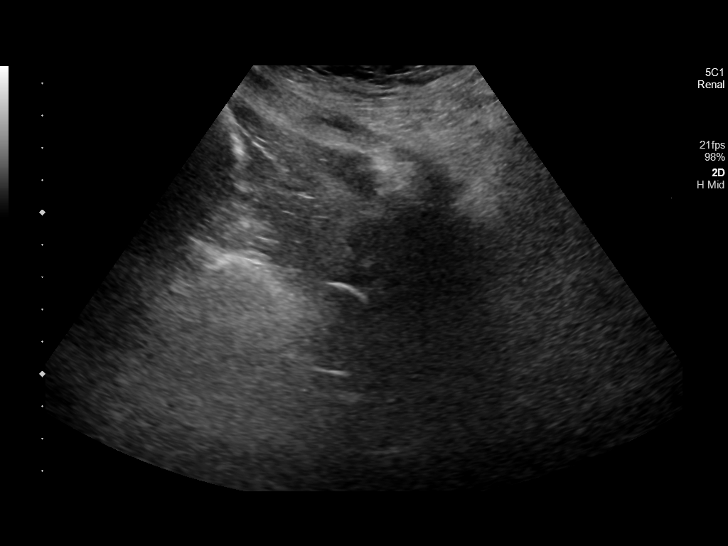
[im 56/56]
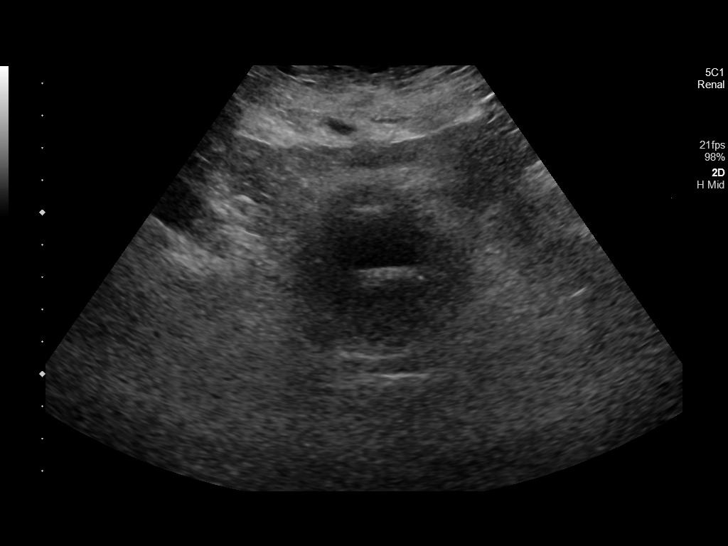

[14 of 25 positions shown; findings below may reference images not displayed]

FINDINGS: Right Kidney:

Renal measurements: 9.2 x 4.8 x 7.0 cm. = volume: 159 mL. 3.4 cm
simple cyst is noted in the upper pole centrally. No mass lesion or
hydronephrosis is noted. Mild increased echogenicity is noted.

Left Kidney:

Renal measurements: 10.4 x 5.7 x 4.6 cm. = volume: 143 mL. 2.1 cm
mid polar renal cyst is noted simple in nature. Mild increased
echogenicity is noted.

Bladder:

Decompressed secondary to Foley catheter.

Other:

None.
IMPRESSION: Mild increased echogenicity is noted bilaterally.

Bilateral simple cysts are seen.

## 2020-12-26 MED ORDER — SODIUM CHLORIDE 0.9% FLUSH
3.0000 mL | Freq: Two times a day (BID) | INTRAVENOUS | Status: DC
  Administered 2020-12-26 – 2021-01-05 (×18): 3 mL via INTRAVENOUS

## 2020-12-26 MED ORDER — MAGNESIUM SULFATE IN D5W 1-5 GM/100ML-% IV SOLN
1.0000 g | Freq: Once | INTRAVENOUS | Status: AC
  Administered 2020-12-26: 1 g via INTRAVENOUS
  Filled 2020-12-26: qty 100

## 2020-12-26 MED ORDER — ORAL CARE MOUTH RINSE
15.0000 mL | OROMUCOSAL | Status: DC
  Administered 2020-12-26 – 2021-02-18 (×478): 15 mL via OROMUCOSAL

## 2020-12-26 MED ORDER — FENTANYL CITRATE (PF) 100 MCG/2ML IJ SOLN
100.0000 ug | Freq: Once | INTRAMUSCULAR | Status: AC
  Administered 2020-12-26: 100 ug via INTRAVENOUS

## 2020-12-26 MED ORDER — SODIUM CHLORIDE 0.9% FLUSH
10.0000 mL | INTRAVENOUS | Status: DC | PRN
  Administered 2021-01-02: 10 mL

## 2020-12-26 MED ORDER — FAMOTIDINE 40 MG/5ML PO SUSR
20.0000 mg | Freq: Every day | ORAL | Status: DC
  Administered 2020-12-26 – 2021-02-25 (×59): 20 mg
  Filled 2020-12-26 (×66): qty 2.5

## 2020-12-26 MED ORDER — CHLORHEXIDINE GLUCONATE 0.12% ORAL RINSE (MEDLINE KIT)
15.0000 mL | Freq: Two times a day (BID) | OROMUCOSAL | Status: DC
  Administered 2020-12-26 – 2021-02-18 (×103): 15 mL via OROMUCOSAL
  Filled 2020-12-26 (×3): qty 15

## 2020-12-26 MED ORDER — SODIUM CHLORIDE 0.9 % IV SOLN: 250.0000 mL | INTRAVENOUS | Status: DC | PRN

## 2020-12-26 MED ORDER — SODIUM CHLORIDE 0.9% FLUSH
10.0000 mL | Freq: Two times a day (BID) | INTRAVENOUS | Status: DC
  Administered 2020-12-26 – 2021-01-03 (×16): 10 mL
  Administered 2021-01-04: 40 mL
  Administered 2021-01-04 – 2021-01-05 (×2): 10 mL

## 2020-12-26 MED ORDER — SODIUM CHLORIDE 0.9 % IV SOLN: INTRAVENOUS | Status: AC

## 2020-12-26 MED ORDER — HEPARIN SODIUM (PORCINE) 5000 UNIT/ML IJ SOLN: 5000.0000 [IU] | Freq: Three times a day (TID) | INTRAMUSCULAR | Status: DC

## 2020-12-26 MED ORDER — HYDRALAZINE HCL 20 MG/ML IJ SOLN: 10.0000 mg | INTRAMUSCULAR | Status: AC | PRN

## 2020-12-26 MED ORDER — LABETALOL HCL 5 MG/ML IV SOLN: 10.0000 mg | INTRAVENOUS | Status: AC | PRN

## 2020-12-26 MED ORDER — SODIUM CHLORIDE 0.9% FLUSH: 3.0000 mL | INTRAVENOUS | Status: DC | PRN

## 2020-12-26 NOTE — Plan of Care (Signed)
  Problem: Education: Goal: Knowledge of General Education information will improve Description: Including pain rating scale, medication(s)/side effects and non-pharmacologic comfort measures Outcome: Not Applicable   Problem: Health Behavior/Discharge Planning: Goal: Ability to manage health-related needs will improve Outcome: Not Applicable   Problem: Clinical Measurements: Goal: Ability to maintain clinical measurements within normal limits will improve Outcome: Progressing Goal: Will remain free from infection Outcome: Progressing Goal: Diagnostic test results will improve Outcome: Progressing Goal: Respiratory complications will improve Outcome: Progressing Goal: Cardiovascular complication will be avoided Outcome: Progressing

## 2020-12-26 NOTE — Progress Notes (Signed)
PHARMACY - PHYSICIAN COMMUNICATION CRITICAL VALUE ALERT - BLOOD CULTURE IDENTIFICATION (BCID)  Jeremy Browning is an 60 y.o. male who presented to [facility] on 12/25/2020 with a chief complaint of respiratory failure  Assessment:  1/4 bottles (aerobic bottle) with staph species, no organism identified  Name of physician (or Provider) Contacted: Katrinka Blazing  Current antibiotics: Unasyn and doxycycline   Changes to prescribed antibiotics recommended:  Patient is on recommended antibiotics - No changes needed - likely contaminant  No results found for this or any previous visit.  Reatha Armour, PharmD Pharmacy Resident  12/26/2020 12:08 PM

## 2020-12-26 NOTE — Progress Notes (Signed)
Patient had episode of heart block, with heart rate down to 34. Patients BP dropped during episode, lasted approximately 1 minute, and patient converted back to BBB with 1st degree block. Attempted to obtain EKG of episode, but patient converted before this RN was able to obtain EKG, Cheryll Cockayne NP aware.

## 2020-12-26 NOTE — Progress Notes (Addendum)
Progress Note  Patient Name: Jeremy Browning Date of Encounter: 12/26/2020  Primary Cardiologist: New to [facility]   Subjective   Intubated and sedated. Remains on Levophed (presented hypertensive, though has been hypotensive on sedation). CXR this morning is improved despite poor UOP. No patient records available in [place], including following a direct query.   Inpatient Medications    Scheduled Meds: . chlorhexidine gluconate (MEDLINE KIT)  15 mL Mouth Rinse BID  . Chlorhexidine Gluconate Cloth  6 each Topical Q0600  . docusate  100 mg Per Tube BID  . famotidine  20 mg Per Tube Daily  . fentaNYL (SUBLIMAZE) injection  50 mcg Intravenous Once  . heparin  5,000 Units Subcutaneous Q8H  . insulin aspart  0-15 Units Subcutaneous Q4H  . mouth rinse  15 mL Mouth Rinse 10 times per day  . polyethylene glycol  17 g Per Tube Daily  . sodium chloride flush  10-40 mL Intracatheter Q12H  . sodium chloride flush  3 mL Intravenous Q12H   Continuous Infusions: . sodium chloride    . sodium chloride    . sodium chloride    . ampicillin-sulbactam (UNASYN) IV 1.5 g (12/26/20 0916)  . doxycycline (VIBRAMYCIN) IV 100 mg (12/26/20 0832)  . fentaNYL infusion INTRAVENOUS 300 mcg/hr (12/26/20 0616)  . midazolam 8 mg/hr (12/26/20 0600)  . norepinephrine (LEVOPHED) Adult infusion 16 mcg/min (12/26/20 0741)   PRN Meds: sodium chloride, albuterol, docusate sodium, fentaNYL, hydrALAZINE, labetalol, midazolam, polyethylene glycol, sodium chloride flush, sodium chloride flush   Vital Signs    Vitals:   12/26/20 0830 12/26/20 0845 12/26/20 0900 12/26/20 0915  BP: (!) 145/35 (!) 145/41 (!) 142/34 (!) 137/36  Pulse: (!) 53 (!) 54 (!) 52 (!) 53  Resp: (!) 22 (!) 22 (!) 22 (!) 22  Temp: (!) 100.4 F (38 C) (!) 100.4 F (38 C) 100.22 F (37.9 C) 100.22 F (37.9 C)  TempSrc:      SpO2: 100% 100% 100% 100%  Weight:        Intake/Output Summary (Last 24 hours) at 12/26/2020 0941 Last data  filed at 12/26/2020 0600 Gross per 24 hour  Intake 1593.86 ml  Output 365 ml  Net 1228.86 ml   Filed Weights   12/25/20 1526 12/25/20 1824 12/26/20 0426  Weight: 120 kg 127.7 kg 127.8 kg    Telemetry    Sinus bradycardia, brief episode of Mobitz type I overnight - Personally Reviewed  ECG    NSR, 62 bpm, 1st degree AV block, IVCD vs LBBB - Personally Reviewed  Physical Exam   GEN: No acute distress. Critically ill appearing.    Neck: JVD unable to be assess secondary to respiratory support apparatus. Cardiac: RRR, no murmurs, rubs, or gallops.  Respiratory: Clear to auscultation bilaterally.  GI: Soft, nontender, non-distended.   MS: No edema; No deformity. Neuro:  Intubated and sedated.  Psych: Intubated and sedated.  Labs    Chemistry Recent Labs  Lab 12/25/20 1545 12/25/20 1651 12/26/20 0420  NA 135  --  135  K 5.5* 5.5* 5.4*  CL 106  --  108  CO2 23  --  18*  GLUCOSE 267*  --  160*  BUN 21*  --  27*  CREATININE 2.16*  --  2.53*  CALCIUM 8.0*  --  7.7*  PROT 7.9  --  7.4  ALBUMIN 3.2*  --  3.0*  AST 85*  --  73*  ALT 58*  --  46*  ALKPHOS  82  --  62  BILITOT 0.6  --  0.8  GFRNONAA 34*  --  28*  ANIONGAP 6  --  9     Hematology Recent Labs  Lab 12/25/20 1451 12/26/20 0420  WBC 17.7* 13.2*  RBC 5.59 5.13  HGB 14.0 12.8*  HCT 46.6 40.7  MCV 83.4 79.3*  MCH 25.0* 25.0*  MCHC 30.0 31.4  RDW 16.8* 16.8*  PLT 171 243    Cardiac EnzymesNo results for input(s): TROPONINI in the last 168 hours. No results for input(s): TROPIPOC in the last 168 hours.   BNP Recent Labs  Lab 12/25/20 1451 12/26/20 0420  BNP 274.6* 687.3*     DDimer No results for input(s): DDIMER in the last 168 hours.   Radiology    DG Chest Port 1 View  Result Date: 12/26/2020 IMPRESSION: 1. Improved airspace lung opacities compared to the previous day's exams, consistent with improved pulmonary edema. 2. Stable support apparatus from the most recent prior study.  Electronically Signed   By: Lajean Manes M.D.   On: 12/26/2020 08:28   DG Chest Port 1 View  Result Date: 12/25/2020 IMPRESSION: 1. Interval retraction of the Swan-Ganz catheter with tip now over the upper SVC. 2. Diffuse bilateral pulmonary opacities, similar or slightly improved on the right. Electronically Signed   By: Anner Crete M.D.   On: 12/25/2020 21:20   DG Chest Port 1 View  Result Date: 12/25/2020 IMPRESSION: No pneumothorax following Swan-Ganz placement. The remainder of the exam is stable. Electronically Signed   By: Inez Catalina M.D.   On: 12/25/2020 19:28   DG Chest Portable 1 View  Result Date: 12/25/2020 IMPRESSION: 1. Extensive bilateral airspace opacity most pronounced in right lower lung zone. Could represent pulmonary hemorrhage, pneumonia/or alveolar edema. 2. Cardiomegaly. 3. Endotracheal tube satisfactory position Electronically Signed   By: Claudie Revering M.D.   On: 12/25/2020 15:15    Cardiac Studies   LHC 12/25/2020:  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  LV End Diastolic Pressure and Pulmonary Capillary Wedge Pressure are both severely elevated.  There is moderate left ventricular systolic dysfunction. The left ventricular ejection fraction is 35-45% by visual estimate.  Respiratory acidosis with pH of 7.0 and PCO2 of 93.   POST-OPERATIVE DIAGNOSIS:  Angiographically normal coronary arteries with no obvious culprit lesion to explain heart failure this level of hypoxia or hypercapnia.  Acute combined systolic and diastolic heart failure: EF of roughly 40 to 45% with global hypokinesis: PCWP and LVEDP of 30 mmHg. ? Cardiac Output and Index relatively normal: CO 6.54, CI 2.82 (mildly reduced). ? AoP-MAP 97/59 mmHg - 71 mmHg; LVP-EDP 101/21-30 mmHg.;PCWP 30 mmHg, PAP 47/17 mmHg-mean 33 mmHg. RAP mean 22 mmHg. ? Secondary Pulmonary Hypertension   RECOMMENDATIONS  Patient needs ICU care with aggressive  pulmonary management to improve oxygenation and ventilation.  The presence of acute renal failure and lactic acidosis precludes his candid[facility] per Dr. Haroldine Laws.  He recommends aggressive pulmonary management and diuresis.  May require milrinone.  Agree with Levophed for blood pressure assistance.  Major concern is ocation of care-he likely will need to be transferred to higher level care center.  Currently working on bed availability at either [facility] or [facility].   Patient Profile     60 y.o. 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   Assessment & Plan    1. Acute hypoxic and hypercapnic respiratory failure secondary to flash pulmonary edema/VT arrest/AV block: -Status post defibrillation x 1 and CPR -Hypertensive upon presentation  -Intubated and sedated with management per CCM -Mobitz type II in the ED with an episode of Mobitz type I noted on tele -Appears to be metabolic in etiology  -Improved when compared to presentation   2. Acute combined systolic and diastolic CHF with flash pulmonary edema: -UOP has been poor so far with Lasix gtt, which has been held this morning with worsening renal function  -Despite poor UOP, clinically he appears to be improving  -Echo pending with EF ~ 35-45% by LV gram -Escalate GDMT as able throughout admission  -Hypotension associated with sedation, findings not consistent with cardiogenic shock at this time -Strict I/O -Weights -Still unable to acquire records from [place] despite direct query, will need to continue to work on this  3 . Elevated troponin: -Trended to 2479 -Likely supply demand ischemia in the context of his respiratory failure with flash pulmonary edema as well as in the setting of AKI of uncertain chronicity  -Emergent  LHC 5/7 showed normal coronary arteries with good CO -Check echo as above  4. Acute on CKD: -Baseline renal function uncertain -Presented with a SCr of 2.16, trending up to 2.5 today with poor UOP with Lasix gtt, which has been held -Nephrology has been consulted      For questions or updates, please contact [place] Please consult [email or web address] for contact info under Cardiology/STEMI.    Signed, Christell Faith, PA-C [place] Pager: [phone] 12/26/2020, 9:41 AM   Attending Note:   The patient was seen and examined.  Agree with assessment and plan as noted above.  Changes made to the above note as needed.  Patient seen and independently examined with Christell Faith, PA .   We discussed all aspects of the encounter. I agree with the assessment and plan as stated above.  1.  Acute respiratory failure:  - flash pulmonary edema  Cath yesterday showed normal coronaries Cardiac output was normal  EF 45-50% by LV gram  He has not diuresed much but his CXR has improved significantly . Continue supportive care   2.  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   This is benign.  Does not need any additional work up at this time .  3. Elevated troponin:   Likely due to demand ischemia   4.   Acute renal insufficiency   5.  HTN:   Following .   He was hypotensive last night requiring levophed.      I have spent a total of 40 minutes with patient reviewing hospital  notes , telemetry, EKGs, labs and examining patient as well as establishing an assessment and plan that was discussed with the patient. > 50% of time was spent in direct patient care.    Thayer Headings, Brooke Bonito., MD, [facility] 12/26/2020, 10:16 AM [address]  [place] Pager [phone]

## 2020-12-26 NOTE — Progress Notes (Addendum)
NAME:  Jeremy Browning, MRN:  [id number], DOB:  [birth year], LOS: 1 ADMISSION DATE:  12/25/2020, CONSULTATION DATE:  12/25/2020 REFERRING MD:  ED, CHIEF COMPLAINT: Hypoxemic and hypercapnic respiratory failure  History of Present Illness:  60 yo M presenting to [facility] ED from home via EMS with respiratory distress that had been progressively worsening over the course of 12/25/20. Per documentation family stated the patient was in his normal state of health until this morning when he began feeling unwell. He then called out stating he could not breathe. EMS found the patient hypoxic and in distress with SpO2 in the 70's on room air. Patient was place on CPAP, arriving to the ED unresponsive and pulseless. ED course: Upon arrival to ED patient was defibrillated x 1 and received CPR before achieving ROSC. Initial EKG suggestive of global ischemia, second EKG had possible LBBB vs IVCD. Patient was emergently intubated requiring mechanical ventilation. Per ED documentation STEMI team activated in the setting of a witnessed shockable cardiac arrest with clinical exam highly suggestive of pulmonary edema with pink frothy sputum throughout ET tubing.    Pertinent  Medical History  Obesity Hypertension Diabetes mellitus Chronic renal insufficiency Combined systolic and diastolic heart failure  Significant Hospital Events: Including procedures, antibiotic start and stop dates in addition to other pertinent events   . Cardiac catheterization on presentation with normal coronaries, good cardiac output, elevated pulmonary capillary wedge pressure.  Interim History / Subjective:  On lung protective ventilator strategy overnight.  This morning she was on 100% FiO2 and PEEP of 14.  Arterial blood gas revealed a PaO2 over 300 with a PCO2 of 31. He is on norepinephrine drip at 14 mcg/min to maintain adequate mean arterial pressure.  Objective   Blood pressure (!) 131/27, pulse (!) 51, temperature (!) 100.58 F (38.1  C), resp. rate (!) 26, weight 127.8 kg, SpO2 100 %. CVP:  [9 mmHg-17 mmHg] 17 mmHg  Vent Mode: PRVC FiO2 (%):  [100 %] 100 % Set Rate:  [20 bmp-28 bmp] 26 bmp Vt Set:  [500 mL-550 mL] 550 mL PEEP:  [10 cmH20-14 cmH20] 14 cmH20 Plateau Pressure:  [30 cmH20-33 cmH20] 30 cmH20   Intake/Output Summary (Last 24 hours) at 12/26/2020 0826 Last data filed at 12/26/2020 0600 Gross per 24 hour  Intake 1593.86 ml  Output 365 ml  Net 1228.86 ml   Filed Weights   12/25/20 1526 12/25/20 1824 12/26/20 0426  Weight: 120 kg 127.7 kg 127.8 kg    Examination: General: Laying in bed intubated and sedated; presently no response to verbal or noxious stimuli HENT: Endotracheal tube in place and mucous membranes are moist Lungs: Much improved air movement bilaterally without focal wheezing or rales Cardiovascular: Regular rate and rhythm without murmur rub or gallop Abdomen: Soft; nontender nondistended Extremities: Chronic venous stasis changes with mild edema Neuro: Presently on Versed and fentanyl drips.  No significant response to verbal or noxious stimuli GU: Foley catheter in place  Labs/imaging that I have personally reviewed  (right click and "Reselect all SmartList Selections" daily)  Sodium 135, BUN 27, creatinine 2.53, white blood cell count 13.2, hemoglobin 12.8, platelet count 243 Chest x-ray shows markedly improved bilateral pulmonary infiltrates consistent with resolving edema  Resolved Hospital Problem list    Assessment & Plan:  Acute hypoxemic and hypercarbic respiratory failure related to pulmonary edema.  Much improved compared to yesterday. -Continue lung protective ventilator strategy. -Titrate PEEP and FiO2 to keep saturation greater than 90% -Adjust minute ventilation to help  normalize pH -Keep plateau pressure less than 30 and driving pressure less than 15  Acute pulmonary edema related to combined systolic and diastolic dysfunction.  Flash edema.  Reported history of  congestive heart failure with prolonged hospitalization 2 years ago for issues revolving around heart failure and respiratory problems. -Normal coronaries on cath with good cardiac output.  Elevated pulmonary capillary wedge pressure.  Mild hypotension in part related to sedation..  Presently on norepinephrine drip to maintain adequate mean arterial pressure. -Titrate norepinephrine drip to goal MAP 65.  Acute on chronic renal failure.  Presented with creatinine of 2.16.  Some old records suggest creatinine at baseline elevated to approximately 1.7.  Today creatinine up to 2.5 with poor urine output overnight on Lasix drip. -Discontinue Lasix drip -Follow renal function electrolytes -Nephrology consultation  Concern for anoxic event.  Received treatment for pulseless V. tach in the ER and had prolonged hypoxemia related to severe edema/lung injury. -Sedation break and assessment of neurologic function. -If does not arouse appropriately CT of the head  Presently on empiric antibiotics which I would discontinue in another 24 hours if cultures unremarkable and continues to improve.  Morbid obesity Diabetes mellitus History of hypertension    Best practice (right click and "Reselect all SmartList Selections" daily)  Diet:  NPO Pain/Anxiety/Delirium protocol (if indicated): Yes (RASS goal -1,-2) VAP protocol (if indicated): Yes DVT prophylaxis: Subcutaneous Heparin GI prophylaxis: H2B Glucose control:  SSI Yes Central venous access:  Yes, and it is still needed Arterial line:  Yes, and it is still needed Foley:  Yes, and it is still needed Mobility:  bed rest  PT consulted: No Code Status:  full code Disposition: ICU  Labs   CBC: Recent Labs  Lab 12/25/20 1451 12/26/20 0420  WBC 17.7* 13.2*  NEUTROABS 5.1  --   HGB 14.0 12.8*  HCT 46.6 40.7  MCV 83.4 79.3*  PLT 171 243    Basic Metabolic Panel: Recent Labs  Lab 12/25/20 1545 12/25/20 1651 12/25/20 1905  12/26/20 0420  NA 135  --   --  135  K 5.5* 5.5*  --  5.4*  CL 106  --   --  108  CO2 23  --   --  18*  GLUCOSE 267*  --   --  160*  BUN 21*  --   --  27*  CREATININE 2.16*  --   --  2.53*  CALCIUM 8.0*  --   --  7.7*  MG  --   --  2.1 1.7  PHOS  --   --  7.9* 4.0   GFR: CrCl cannot be calculated (Unknown ideal weight.). Recent Labs  Lab 12/25/20 1451 12/25/20 1545 12/25/20 1843 12/26/20 0420  PROCALCITON  --  <0.10  --  33.57  WBC 17.7*  --   --  13.2*  LATICACIDVEN 7.8*  --  1.9  --     Liver Function Tests: Recent Labs  Lab 12/25/20 1545 12/26/20 0420  AST 85* 73*  ALT 58* 46*  ALKPHOS 82 62  BILITOT 0.6 0.8  PROT 7.9 7.4  ALBUMIN 3.2* 3.0*   No results for input(s): LIPASE, AMYLASE in the last 168 hours. No results for input(s): AMMONIA in the last 168 hours.  ABG    Component Value Date/Time   PHART 7.35 12/26/2020 0755   PCO2ART 31 (L) 12/26/2020 0755   PO2ART 376 (H) 12/26/2020 0755   HCO3 17.1 (L) 12/26/2020 0755   ACIDBASEDEF 7.4 (H) 12/26/2020  0755   O2SAT 100.0 12/26/2020 0755     Coagulation Profile: Recent Labs  Lab 12/25/20 1451  INR 1.2    Cardiac Enzymes: No results for input(s): CKTOTAL, CKMB, CKMBINDEX, TROPONINI in the last 168 hours.  HbA1C: No results found for: HGBA1C  CBG: Recent Labs  Lab 12/25/20 1450 12/25/20 1824 12/25/20 2352 12/26/20 0304 12/26/20 0716  GLUCAP 277* 180* 114* 117* 136*    Review of Systems:   Unable to perfrom  Past Medical History:  He,  has a past medical history of Diabetes mellitus type II, non insulin dependent (HCC), Essential hypertension ([name]), Heart failure (HCC), and Venous stasis dermatitis of left lower extremity.   Surgical History:   Past Surgical History:  Procedure Laterality Date  . Unknown       Social History:      Family History:  His Family history is unknown by patient.   Allergies Not on File   Home Medications  Prior to Admission medications    Not on File     Critical care time: 45 minutes critical care time/management excluding procedures

## 2020-12-26 NOTE — Progress Notes (Signed)
PHARMACY CONSULT NOTE - FOLLOW UP  Pharmacy Consult for Electrolyte Monitoring and Replacement   Recent Labs: Potassium (mmol/L)  Date Value  12/26/2020 5.4 (H)   Magnesium (mg/dL)  Date Value  88/89/1694 1.7   Calcium (mg/dL)  Date Value  50/38/8828 7.7 (L)   Albumin (g/dL)  Date Value  00/34/9179 3.0 (L)   Phosphorus (mg/dL)  Date Value  15/12/6977 4.0   Sodium (mmol/L)  Date Value  12/26/2020 135  Corrected calcium=8.64   Assessment: 60 yr old male admitted following witnessed cardiac arrest Pt. Is S/P CPR intubation. Pharmacy consulted for electrolyte monitoring and replacement.  Goal of Therapy:  Electrolytes WNL  Plan:  K 5.4  Mag 1.7  Phos 4.0  Scr 2.53  Na 135 Note lasix drip started 5/7 evening and discontinued this am 5/8. Nephrology consulted -Will conservatively replace with Magnesium 1 gram IV x 1 dose. Watch K Will follow with AM labs and replace as needed.  Angelique Blonder ,PharmD Clinical Pharmacist 12/26/2020 9:23 AM

## 2020-12-26 NOTE — Consult Note (Signed)
[facility]  CONSULT NOTE    Date: 12/26/2020                  Patient Name:  Jeremy Browning  MRN: [id number]  DOB: 06/05/1961  Age / Sex: 60 y.o., male         PCP: No primary care provider on file.                 Service Requesting Consult: Dr. Smith                 Reason for Consult: Acute kidney injury            History of Present Illness: Mr. Murdock Sledge admitted to [facility] on Cardiogenic shock (HCC) [R57.0] Flash pulmonary edema (HCC) [J81.0] Cardiac arrest with successful resuscitation (HCC) [I46.9] Cardiopulmonary arrest with successful resuscitation (HCC) [I46.9]  Patient had cardiac arrest and had possible pneumonia. History taken from chart. Sisters at bedside.    Medications: Outpatient medications: No medications prior to admission.    Current medications: Current Facility-Administered Medications  Medication Dose Route Frequency Provider Last Rate Last Admin  . 0.9 %  sodium chloride infusion  250 mL Intravenous Continuous Harding, David W, MD      . 0.9 %  sodium chloride infusion   Intravenous Continuous Harding, David W, MD      . 0.9 %  sodium chloride infusion  250 mL Intravenous PRN Harding, David W, MD      . albuterol (PROVENTIL) (2.5 MG/3ML) 0.083% nebulizer solution 2.5 mg  2.5 mg Nebulization Q4H PRN Rust-Chester, Britton L, NP      . ampicillin-sulbactam (UNASYN) 1.5 g in sodium chloride 0.9 % 100 mL IVPB  1.5 g Intravenous Q6H Smith, James, MD 200 mL/hr at 12/26/20 0916 1.5 g at 12/26/20 0916  . chlorhexidine gluconate (MEDLINE KIT) (PERIDEX) 0.12 % solution 15 mL  15 mL Mouth Rinse BID Rust-Chester, Britton L, NP   15 mL at 12/26/20 0916  . Chlorhexidine Gluconate Cloth 2 % PADS 6 each  6 each Topical Q0600 Harding, David W, MD   6 each at 12/26/20 0030  . docusate (COLACE) 50 MG/5ML liquid 100 mg  100 mg Per Tube BID Smith, James, MD   100 mg at 12/25/20 2144  . docusate sodium (COLACE) capsule 100 mg  100 mg Oral BID PRN  Harding, David W, MD      . doxycycline (VIBRAMYCIN) 100 mg in sodium chloride 0.9 % 250 mL IVPB  100 mg Intravenous Q12H Smith, James, MD 125 mL/hr at 12/26/20 0832 100 mg at 12/26/20 0832  . famotidine (PEPCID) 40 MG/5ML suspension 20 mg  20 mg Per Tube Daily Smith, James, MD      . fentaNYL (SUBLIMAZE) bolus via infusion 50-100 mcg  50-100 mcg Intravenous Q15 min PRN Smith, James, MD      . fentaNYL (SUBLIMAZE) injection 50 mcg  50 mcg Intravenous Once Smith, James, MD      . fentaNYL 2500mcg in NS 250mL (10mcg/ml) infusion-PREMIX  0-400 mcg/hr Intravenous Continuous Rust-Chester, Britton L, NP 30 mL/hr at 12/26/20 0616 300 mcg/hr at 12/26/20 0616  . heparin injection 5,000 Units  5,000 Units Subcutaneous Q8H Harding, David W, MD   5,000 Units at 12/26/20 0540  . hydrALAZINE (APRESOLINE) injection 10 mg  10 mg Intravenous Q20 Min PRN Harding, David W, MD      . insulin aspart (novoLOG) injection 0-15 Units  0-15 Units Subcutaneous Q4H Rust-Chester, Britton   L, NP   2 Units at 12/26/20 0918  . labetalol (NORMODYNE) injection 10 mg  10 mg Intravenous Q10 min PRN Harding, David W, MD      . magnesium sulfate IVPB 1 g 100 mL  1 g Intravenous Once Smith, James, MD      . MEDLINE mouth rinse  15 mL Mouth Rinse 10 times per day Rust-Chester, Britton L, NP   15 mL at 12/26/20 0539  . midazolam (VERSED) 50 mg/50 mL (1 mg/mL) premix infusion  0.5-10 mg/hr Intravenous Continuous Rust-Chester, Britton L, NP 8 mL/hr at 12/26/20 0954 8 mg/hr at 12/26/20 0954  . midazolam (VERSED) bolus via infusion 2 mg  2 mg Intravenous Q2H PRN Rust-Chester, Britton L, NP   2 mg at 12/25/20 2318  . norepinephrine (LEVOPHED) 4mg in 250mL premix infusion  5-50 mcg/min Intravenous Titrated Harding, David W, MD 60 mL/hr at 12/26/20 0741 16 mcg/min at 12/26/20 0741  . polyethylene glycol (MIRALAX / GLYCOLAX) packet 17 g  17 g Oral Daily PRN Harding, David W, MD      . polyethylene glycol (MIRALAX / GLYCOLAX) packet 17 g  17 g Per  Tube Daily Smith, James, MD      . sodium chloride flush (NS) 0.9 % injection 10-40 mL  10-40 mL Intracatheter Q12H Harding, David W, MD   10 mL at 12/26/20 0955  . sodium chloride flush (NS) 0.9 % injection 10-40 mL  10-40 mL Intracatheter PRN Harding, David W, MD      . sodium chloride flush (NS) 0.9 % injection 3 mL  3 mL Intravenous Q12H Harding, David W, MD   3 mL at 12/26/20 0955  . sodium chloride flush (NS) 0.9 % injection 3 mL  3 mL Intravenous PRN Harding, David W, MD          Allergies: Not on File    Past Medical History: Past Medical History:  Diagnosis Date  . Diabetes mellitus type II, non insulin dependent (HCC)   . Essential hypertension [name]  . Heart failure (HCC)    Unknown details.  By report no cardiac surgery  . Venous stasis dermatitis of left lower extremity      Past Surgical History: Past Surgical History:  Procedure Laterality Date  . Unknown       Family History: Family History  Family history unknown: Yes     Social History: Social History   Socioeconomic History  . Marital status: Not on file    Spouse name: Not on file  . Number of children: Not on file  . Years of education: Not on file  . Highest education level: Not on file  Occupational History  . Not on file  Tobacco Use  . Smoking status: Not on file  . Smokeless tobacco: Not on file  Substance and Sexual Activity  . Alcohol use: Not on file  . Drug use: Not on file  . Sexual activity: Not on file  Other Topics Concern  . Not on file  Social History Narrative   He lives with his brother and the brother's family.  Recently moved here to the [place] area.  Has never been established here at [facility].   Social Determinants of Health   Financial Resource Strain: Not on file  Food Insecurity: Not on file  Transportation Needs: Not on file  Physical Activity: Not on file  Stress: Not on file  Social Connections: Not on file  Intimate Partner Violence: Not on file        Review of Systems: Review of Systems  Unable to perform ROS: Critical illness    Vital Signs: Blood pressure (!) 139/34, pulse (!) 53, temperature 100.04 F (37.8 C), resp. rate (!) 22, weight 127.8 kg, SpO2 100 %.  Weight trends: Filed Weights   12/25/20 1526 12/25/20 1824 12/26/20 0426  Weight: 120 kg 127.7 kg 127.8 kg    Physical Exam: General: Critically ill  Head: ETT   Eyes: Anicteric, PERRL  Neck:  trachea midline  Lungs:  PRVC FiO2 100%  Heart: bradycardia  Abdomen:  Soft  Extremities:  no peripheral edema.  Neurologic: Intubated and sedated   Skin: No lesions  Access:  Left femoral vascath 5/8      Lab results: Basic Metabolic Panel: Recent Labs  Lab 12/25/20 1545 12/25/20 1651 12/25/20 1905 12/26/20 0420  NA 135  --   --  135  K 5.5* 5.5*  --  5.4*  CL 106  --   --  108  CO2 23  --   --  18*  GLUCOSE 267*  --   --  160*  BUN 21*  --   --  27*  CREATININE 2.16*  --   --  2.53*  CALCIUM 8.0*  --   --  7.7*  MG  --   --  2.1 1.7  PHOS  --   --  7.9* 4.0    Liver Function Tests: Recent Labs  Lab 12/25/20 1545 12/26/20 0420  AST 85* 73*  ALT 58* 46*  ALKPHOS 82 62  BILITOT 0.6 0.8  PROT 7.9 7.4  ALBUMIN 3.2* 3.0*   No results for input(s): LIPASE, AMYLASE in the last 168 hours. No results for input(s): AMMONIA in the last 168 hours.  CBC: Recent Labs  Lab 12/25/20 1451 12/26/20 0420  WBC 17.7* 13.2*  NEUTROABS 5.1  --   HGB 14.0 12.8*  HCT 46.6 40.7  MCV 83.4 79.3*  PLT 171 243    Cardiac Enzymes: No results for input(s): CKTOTAL, CKMB, CKMBINDEX, TROPONINI in the last 168 hours.  BNP: Invalid input(s): POCBNP  CBG: Recent Labs  Lab 12/25/20 1450 12/25/20 1824 12/25/20 2352 12/26/20 0304 12/26/20 0716  GLUCAP 277* 180* 114* 117* 136*    Microbiology: Results for orders placed or performed during the hospital encounter of 12/25/20  Culture, blood (routine x 2)     Status: None (Preliminary result)    Collection Time: 12/25/20  2:55 PM   Specimen: BLOOD  Result Value Ref Range Status   Specimen Description BLOOD BLOOD RIGHT HAND  Final   Special Requests   Final    BOTTLES DRAWN AEROBIC AND ANAEROBIC Blood Culture adequate volume   Culture  Setup Time Organism ID to follow  Final   Culture   Final    NO GROWTH < 12 HOURS Performed at [facility], [address] [place], [place] [address]    Report Status PENDING  Incomplete  Resp Panel by RT-PCR (Flu A&B, Covid) Nasopharyngeal Swab     Status: None   Collection Time: 12/25/20  5:51 PM   Specimen: Nasopharyngeal Swab; Nasopharyngeal(NP) swabs in vial transport medium  Result Value Ref Range Status   SARS Coronavirus 2 by RT PCR NEGATIVE NEGATIVE Final    Comment: (NOTE) SARS-CoV-2 target nucleic acids are NOT DETECTED.  The SARS-CoV-2 RNA is generally detectable in upper respiratory specimens during the acute phase of infection. The lowest concentration of SARS-CoV-2 viral copies this assay can detect is 138 copies/mL.   A negative result does not preclude SARS-Cov-2 infection and should not be used as the sole basis for treatment or other patient management decisions. A negative result may occur with  improper specimen collection/handling, submission of specimen other than nasopharyngeal swab, presence of viral mutation(s) within the areas targeted by this assay, and inadequate number of viral copies(<138 copies/mL). A negative result must be combined with clinical observations, patient history, and epidemiological information. The expected result is Negative.  Fact Sheet for Patients:  EntrepreneurPulse.com.au  Fact Sheet for Healthcare Providers:  IncredibleEmployment.be  This test is no t yet approved or cleared by the [place] FDA and  has been authorized for detection and/or diagnosis of SARS-CoV-2 by FDA under an Emergency Use Authorization (EUA). This EUA will remain   in effect (meaning this test can be used) for the duration of the COVID-19 declaration under Section 564(b)(1) of the Act, 21 U.S.C.section 360bbb-3(b)(1), unless the authorization is terminated  or revoked sooner.       Influenza A by PCR NEGATIVE NEGATIVE Final   Influenza B by PCR NEGATIVE NEGATIVE Final    Comment: (NOTE) The Xpert Xpress SARS-CoV-2/FLU/RSV plus assay is intended as an aid in the diagnosis of influenza from Nasopharyngeal swab specimens and should not be used as a sole basis for treatment. Nasal washings and aspirates are unacceptable for Xpert Xpress SARS-CoV-2/FLU/RSV testing.  Fact Sheet for Patients: EntrepreneurPulse.com.au  Fact Sheet for Healthcare Providers: IncredibleEmployment.be  This test is not yet approved or cleared by the [place] FDA and has been authorized for detection and/or diagnosis of SARS-CoV-2 by FDA under an Emergency Use Authorization (EUA). This EUA will remain in effect (meaning this test can be used) for the duration of the COVID-19 declaration under Section 564(b)(1) of the Act, 21 U.S.C. section 360bbb-3(b)(1), unless the authorization is terminated or revoked.  Performed at [facility], [address] [place], [place] [address]   MRSA PCR Screening     Status: None   Collection Time: 12/25/20  6:39 PM   Specimen: Nasopharyngeal  Result Value Ref Range Status   MRSA by PCR NEGATIVE NEGATIVE Final    Comment:        The GeneXpert MRSA Assay (FDA approved for NASAL specimens only), is one component of a comprehensive MRSA colonization surveillance program. It is not intended to diagnose MRSA infection nor to guide or monitor treatment for MRSA infections. Performed at [facility], [address] [place], [place] [address]   Culture, blood (routine x 2)     Status: None (Preliminary result)   Collection Time: 12/25/20  6:43 PM   Specimen: BLOOD   Result Value Ref Range Status   Specimen Description BLOOD Blood Culture adequate volume  Final   Special Requests   Final    BOTTLES DRAWN AEROBIC AND ANAEROBIC LEFT ANTECUBITAL   Culture   Final    NO GROWTH < 12 HOURS Performed at [facility], [address], [place], [place] [address]    Report Status PENDING  Incomplete    Coagulation Studies: Recent Labs    12/25/20 1451  LABPROT 14.7  INR 1.2    Urinalysis: Recent Labs    12/25/20 1839  COLORURINE YELLOW*  LABSPEC 1.017  PHURINE 5.0  GLUCOSEU NEGATIVE  HGBUR LARGE*  BILIRUBINUR NEGATIVE  KETONESUR NEGATIVE  PROTEINUR 100*  NITRITE NEGATIVE  LEUKOCYTESUR NEGATIVE      Imaging: CARDIAC CATHETERIZATION  Result Date: 12/25/2020  Hemodynamic findings consistent with mild secondary  pulmonary hypertension.  LV End Diastolic Pressure and Pulmonary Capillary Wedge Pressure are both severely elevated.  There is moderate left ventricular systolic dysfunction. The left ventricular ejection fraction is 35-45% by visual estimate.  Respiratory acidosis with pH of 7.0 and PCO2 of 93.  POST-OPERATIVE DIAGNOSIS:  Angiographically normal coronary arteries with no obvious culprit lesion to explain heart failure this level of hypoxia or hypercapnia.  Acute combined systolic and diastolic heart failure: EF of roughly 40 to 45% with global hypokinesis: PCWP and LVEDP of 30 mmHg.  ? Cardiac Output and Index relatively normal: CO 6.54, CI 2.82 (mildly reduced). ? AoP-MAP 97/59 mmHg - 71 mmHg; LVP-EDP 101/21-30 mmHg.;  PCWP 30 mmHg, PAP 47/17 mmHg-mean 33 mmHg.  RAP mean 22 mmHg. ? Secondary Pulmonary Hypertension RECOMMENDATIONS  Patient needs ICU care with aggressive pulmonary management to improve oxygenation and ventilation.  The presence of acute renal failure and lactic acidosis precludes his candid[facility] per Dr. Bensimhon.  He recommends aggressive pulmonary management and diuresis.  May require milrinone.   Agree with Levophed for blood pressure assistance. Major concern is ocation of care-he likely will need to be transferred to higher level care center.  Currently working on bed availability at either [facility] or [facility]. David Harding, MD  DG Chest Port 1 View  Result Date: 12/26/2020 CLINICAL DATA:  respiratory distress that had been progressively worsening over the course of 5/7/22Now in ICU intubatedHx CHF, DM EXAM: PORTABLE CHEST 1 VIEW COMPARISON:  Three exams on 12/25/2020. FINDINGS: Airspace opacities have decreased from the prior exams, particularly becoming less confluent in the right mid and lower lung. No new lung abnormalities. No convincing pleural effusion and no pneumothorax. Endotracheal tube, nasal/orogastric tube and right internal jugular central venous line are stable. Heart is normal in size. IMPRESSION: 1. Improved airspace lung opacities compared to the previous day's exams, consistent with improved pulmonary edema. 2. Stable support apparatus from the most recent prior study. Electronically Signed   By: David  Ormond M.D.   On: 12/26/2020 08:28   DG Chest Port 1 View  Result Date: 12/25/2020 CLINICAL DATA:  59-year-old male with central line placement. EXAM: PORTABLE CHEST 1 VIEW COMPARISON:  Chest radiograph dated 12/25/2020. FINDINGS: Endotracheal tube above the carina in similar position. Enteric tube extends below the diaphragm with tip in the proximal stomach. There has been retraction of the Swan-Ganz catheter with tip of the catheter now over the upper SVC. Diffuse bilateral pulmonary opacities similar or slightly improved on the right. A small left pleural effusion may be present. No pneumothorax. Stable cardiomegaly. No acute osseous pathology. IMPRESSION: 1. Interval retraction of the Swan-Ganz catheter with tip now over the upper SVC. 2. Diffuse bilateral pulmonary opacities, similar or slightly improved on the right. Electronically Signed   By: Arash  Radparvar M.D.    On: 12/25/2020 21:20   DG Chest Port 1 View  Result Date: 12/25/2020 CLINICAL DATA:  Shortness of breath, status post Swan-Ganz placement EXAM: PORTABLE CHEST 1 VIEW COMPARISON:  Film from earlier in the same day. FINDINGS: Cardiac shadow is stable. Swan-Ganz catheter is noted via the right jugular approach with the tip in the right pulmonary artery. Endotracheal tube and gastric catheter are noted in satisfactory position. Persistent bilateral airspace opacity is noted right considerably greater than left likely representing edema. Continued follow-up is recommended. No pneumothorax is noted. IMPRESSION: No pneumothorax following Swan-Ganz placement. The remainder of the exam is stable. Electronically Signed   By: Mark    Lukens M.D.   On: 12/25/2020 19:28   DG Chest Portable 1 View  Result Date: 12/25/2020 CLINICAL DATA:  Intubated. Respiratory distress. Status post cardiac arrest. EXAM: PORTABLE CHEST 1 VIEW COMPARISON:  None. FINDINGS: Enlarged cardiac silhouette. Endotracheal tube in satisfactory position. Nasogastric tube extending into stomach with the side hole visible in the proximal stomach. Extensive bilateral airspace opacity, most pronounced in the right lower lung zone. No pleural fluid. Unremarkable bones. IMPRESSION: 1. Extensive bilateral airspace opacity most pronounced in right lower lung zone. Could represent pulmonary hemorrhage, pneumonia/or alveolar edema. 2. Cardiomegaly. 3. Endotracheal tube satisfactory position Electronically Signed   By: Steven  Reid M.D.   On: 12/25/2020 15:15      Assessment & Plan: Mr. Olando Leachman is a 59 y.o. black male with diabetes mellitus type II, hypertension, congestive heart failure, who was admitted to [facility] on 12/25/2020 for Cardiogenic shock (HCC) [R57.0] Flash pulmonary edema (HCC) [J81.0] Cardiac arrest with successful resuscitation (HCC) [I46.9] Cardiopulmonary arrest with successful resuscitation (HCC) [I46.9]  1. Acute kidney injury with  hyperkalemia: Proteinuria and hematuria on urinalysis.  No known baseline creatinine.  Cardiac catheterization yesterday.  Oliguric urine output Requiring norepinephrine gtt. Status post furosemide with limited improvement.  Monitor renal function. Low threshold for renal replacement therapy.   2. Acute Respiratory Failure: requiring intubation and mechanical ventilation. Secondary to flash pulmonary edema from acute exacerbation of systolic and diastolic chronic congestive heart failure and cardiogenic shock from acute ischemic event.   3. Hypotension: secondary to cardiogenic shock and possible septic shock - empiric antibiotics - requiring vasopressors: norepinephrine   4. Diabetes mellitus type II with renal manifestations: currently not on any outpatient agents. Hemoglobin A1c pending.   LOS: 1 Sarath Kolluru 5/8/202210:31 AM 

## 2020-12-26 NOTE — Procedures (Signed)
Arterial Catheter Insertion Procedure Note  Jeremy Browning  [id number]  Oct 18, 1960  Date:12/26/20  Time:10:31 AM    Provider Performing: Loraine Leriche    Procedure: Insertion of Arterial Line (86767) with US guidance (20947)   Indication(s) Blood pressure monitoring and/or need for frequent ABGs  Consent Unable to obtain consent due to emergent nature of procedure.  Anesthesia None   Time Out Verified patient identification, verified procedure, site/side was marked, verified correct patient position, special equipment/implants available, medications/allergies/relevant history reviewed, required imaging and test results available.   Sterile Technique Maximal sterile technique including full sterile barrier drape, hand hygiene, sterile gown, sterile gloves, mask, hair covering, sterile ultrasound probe cover (if used).   Procedure Description Area of catheter insertion was cleaned with chlorhexidine and draped in sterile fashion. With real-time ultrasound guidance an arterial catheter was placed into the left femoral artery.  Appropriate arterial tracings confirmed on monitor.     Complications/Tolerance None; patient tolerated the procedure well.   EBL Minimal   Specimen(s) None

## 2020-12-27 ENCOUNTER — Inpatient Hospital Stay: Payer: Medicaid Other

## 2020-12-27 ENCOUNTER — Inpatient Hospital Stay (HOSPITAL_COMMUNITY)
Admit: 2020-12-27 | Discharge: 2020-12-27 | Disposition: A | Discharge status: Home / Self Care | Payer: Medicaid Other | Attending: Pulmonary Disease | Admitting: Pulmonary Disease

## 2020-12-27 ENCOUNTER — Encounter: Payer: Self-pay | Admitting: Cardiology

## 2020-12-27 DIAGNOSIS — I441 Atrioventricular block, second degree: Secondary | ICD-10-CM | POA: Diagnosis not present

## 2020-12-27 DIAGNOSIS — N179 Acute kidney failure, unspecified: Secondary | ICD-10-CM | POA: Diagnosis not present

## 2020-12-27 DIAGNOSIS — I5021 Acute systolic (congestive) heart failure: Secondary | ICD-10-CM

## 2020-12-27 DIAGNOSIS — N189 Chronic kidney disease, unspecified: Secondary | ICD-10-CM | POA: Diagnosis not present

## 2020-12-27 DIAGNOSIS — R570 Cardiogenic shock: Secondary | ICD-10-CM

## 2020-12-27 DIAGNOSIS — J9602 Acute respiratory failure with hypercapnia: Secondary | ICD-10-CM

## 2020-12-27 DIAGNOSIS — J810 Acute pulmonary edema: Secondary | ICD-10-CM | POA: Diagnosis not present

## 2020-12-27 DIAGNOSIS — I469 Cardiac arrest, cause unspecified: Secondary | ICD-10-CM | POA: Diagnosis not present

## 2020-12-27 DIAGNOSIS — J9601 Acute respiratory failure with hypoxia: Secondary | ICD-10-CM | POA: Diagnosis not present

## 2020-12-27 DIAGNOSIS — I110 Hypertensive heart disease with heart failure: Secondary | ICD-10-CM

## 2020-12-27 HISTORY — PX: TRANSTHORACIC ECHOCARDIOGRAM: SHX275

## 2020-12-27 LAB — MAGNESIUM: Magnesium: 1.9 mg/dL (ref 1.7–2.4)

## 2020-12-27 LAB — BLOOD GAS, ARTERIAL
Acid-base deficit: 5.8 mmol/L — ABNORMAL HIGH (ref 0.0–2.0)
Acid-base deficit: 4.9 mmol/L — ABNORMAL HIGH (ref 0.0–2.0)
Bicarbonate: 18 mmol/L — ABNORMAL LOW (ref 20.0–28.0)
Bicarbonate: 20.6 mmol/L (ref 20.0–28.0)
FIO2: 40
FIO2: 0.28
MECHVT: 500 mL
MECHVT: 550 mL
O2 Saturation: 98.1 %
O2 Saturation: 90.8 %
PEEP: 5 cmH2O
PEEP: 5 cmH2O
Patient temperature: 37
Patient temperature: 37
RATE: 22 resp/min
RATE: 16 resp/min
pCO2 arterial: 29 mmHg — ABNORMAL LOW (ref 32.0–48.0)
pCO2 arterial: 39 mmHg (ref 32.0–48.0)
pH, Arterial: 7.4 (ref 7.350–7.450)
pH, Arterial: 7.33 — ABNORMAL LOW (ref 7.350–7.450)
pO2, Arterial: 106 mmHg (ref 83.0–108.0)
pO2, Arterial: 65 mmHg — ABNORMAL LOW (ref 83.0–108.0)

## 2020-12-27 LAB — CBC
HCT: 32.8 % — ABNORMAL LOW (ref 39.0–52.0)
Hemoglobin: 10.7 g/dL — ABNORMAL LOW (ref 13.0–17.0)
MCH: 25.4 pg — ABNORMAL LOW (ref 26.0–34.0)
MCHC: 32.6 g/dL (ref 30.0–36.0)
MCV: 77.9 fL — ABNORMAL LOW (ref 80.0–100.0)
Platelets: 155 10*3/uL (ref 150–400)
RBC: 4.21 MIL/uL — ABNORMAL LOW (ref 4.22–5.81)
RDW: 17 % — ABNORMAL HIGH (ref 11.5–15.5)
WBC: 8.8 10*3/uL (ref 4.0–10.5)
nRBC: 0 % (ref 0.0–0.2)

## 2020-12-27 LAB — ECHOCARDIOGRAM COMPLETE
AR max vel: 1.83 cm2
AV Area VTI: 1.91 cm2
AV Area mean vel: 1.8 cm2
AV Mean grad: 5.3 mmHg
AV Peak grad: 10.5 mmHg
Ao pk vel: 1.62 m/s
Area-P 1/2: 3.17 cm2
S' Lateral: 4.61 cm
Weight: 4437.42 oz

## 2020-12-27 LAB — KAPPA/LAMBDA LIGHT CHAINS
Kappa free light chain: 100 mg/L — ABNORMAL HIGH (ref 3.3–19.4)
Kappa, lambda light chain ratio: 1.8 — ABNORMAL HIGH (ref 0.26–1.65)
Lambda free light chains: 55.6 mg/L — ABNORMAL HIGH (ref 5.7–26.3)

## 2020-12-27 LAB — PHOSPHORUS: Phosphorus: 2.5 mg/dL (ref 2.5–4.6)

## 2020-12-27 LAB — GLUCOSE, CAPILLARY
Glucose-Capillary: 107 mg/dL — ABNORMAL HIGH (ref 70–99)
Glucose-Capillary: 64 mg/dL — ABNORMAL LOW (ref 70–99)
Glucose-Capillary: 127 mg/dL — ABNORMAL HIGH (ref 70–99)
Glucose-Capillary: 84 mg/dL (ref 70–99)
Glucose-Capillary: 78 mg/dL (ref 70–99)
Glucose-Capillary: 86 mg/dL (ref 70–99)
Glucose-Capillary: 92 mg/dL (ref 70–99)
Glucose-Capillary: 109 mg/dL — ABNORMAL HIGH (ref 70–99)

## 2020-12-27 LAB — BASIC METABOLIC PANEL
Anion gap: 3 — ABNORMAL LOW (ref 5–15)
BUN: 29 mg/dL — ABNORMAL HIGH (ref 6–20)
CO2: 22 mmol/L (ref 22–32)
Calcium: 7.8 mg/dL — ABNORMAL LOW (ref 8.9–10.3)
Chloride: 111 mmol/L (ref 98–111)
Creatinine, Ser: 2.35 mg/dL — ABNORMAL HIGH (ref 0.61–1.24)
GFR, Estimated: 31 mL/min — ABNORMAL LOW (ref >60)
Glucose, Bld: 114 mg/dL — ABNORMAL HIGH (ref 70–99)
Potassium: 4 mmol/L (ref 3.5–5.1)
Sodium: 136 mmol/L (ref 135–145)

## 2020-12-27 LAB — LEGIONELLA PNEUMOPHILA SEROGP 1 UR AG: L. pneumophila Serogp 1 Ur Ag: NEGATIVE

## 2020-12-27 LAB — PATHOLOGIST SMEAR REVIEW

## 2020-12-27 LAB — PROCALCITONIN: Procalcitonin: 23.4 ng/mL

## 2020-12-27 IMAGING — DX DG CHEST 1V PORT
1 series · 2 of 2 positions shown · non-contrast
Comparison: Portable chest [DATE] and earlier.

CLINICAL DATA: 59-year-old male with cardiogenic shock. Acute renal
failure.

EXAM:
PORTABLE CHEST 1 VIEW

[Series 1: chest ap · 0.14mm/px · 2 of 2 slices shown]
[im 1/2]
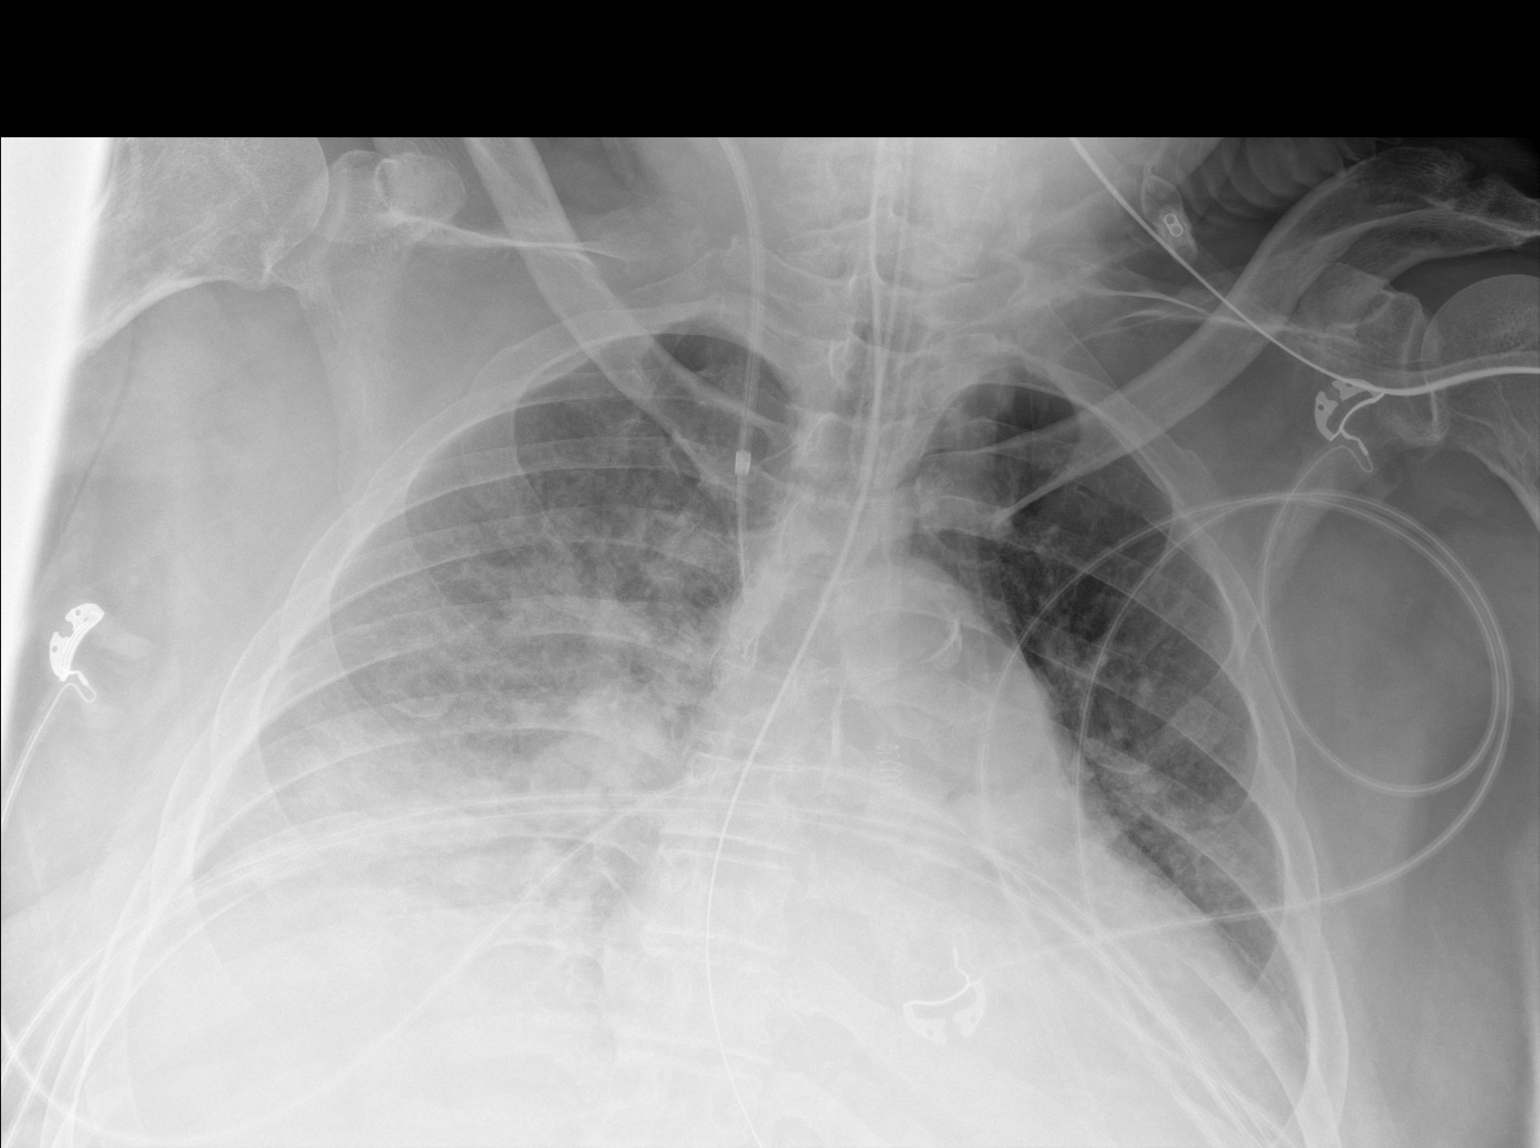
[im 2/2]
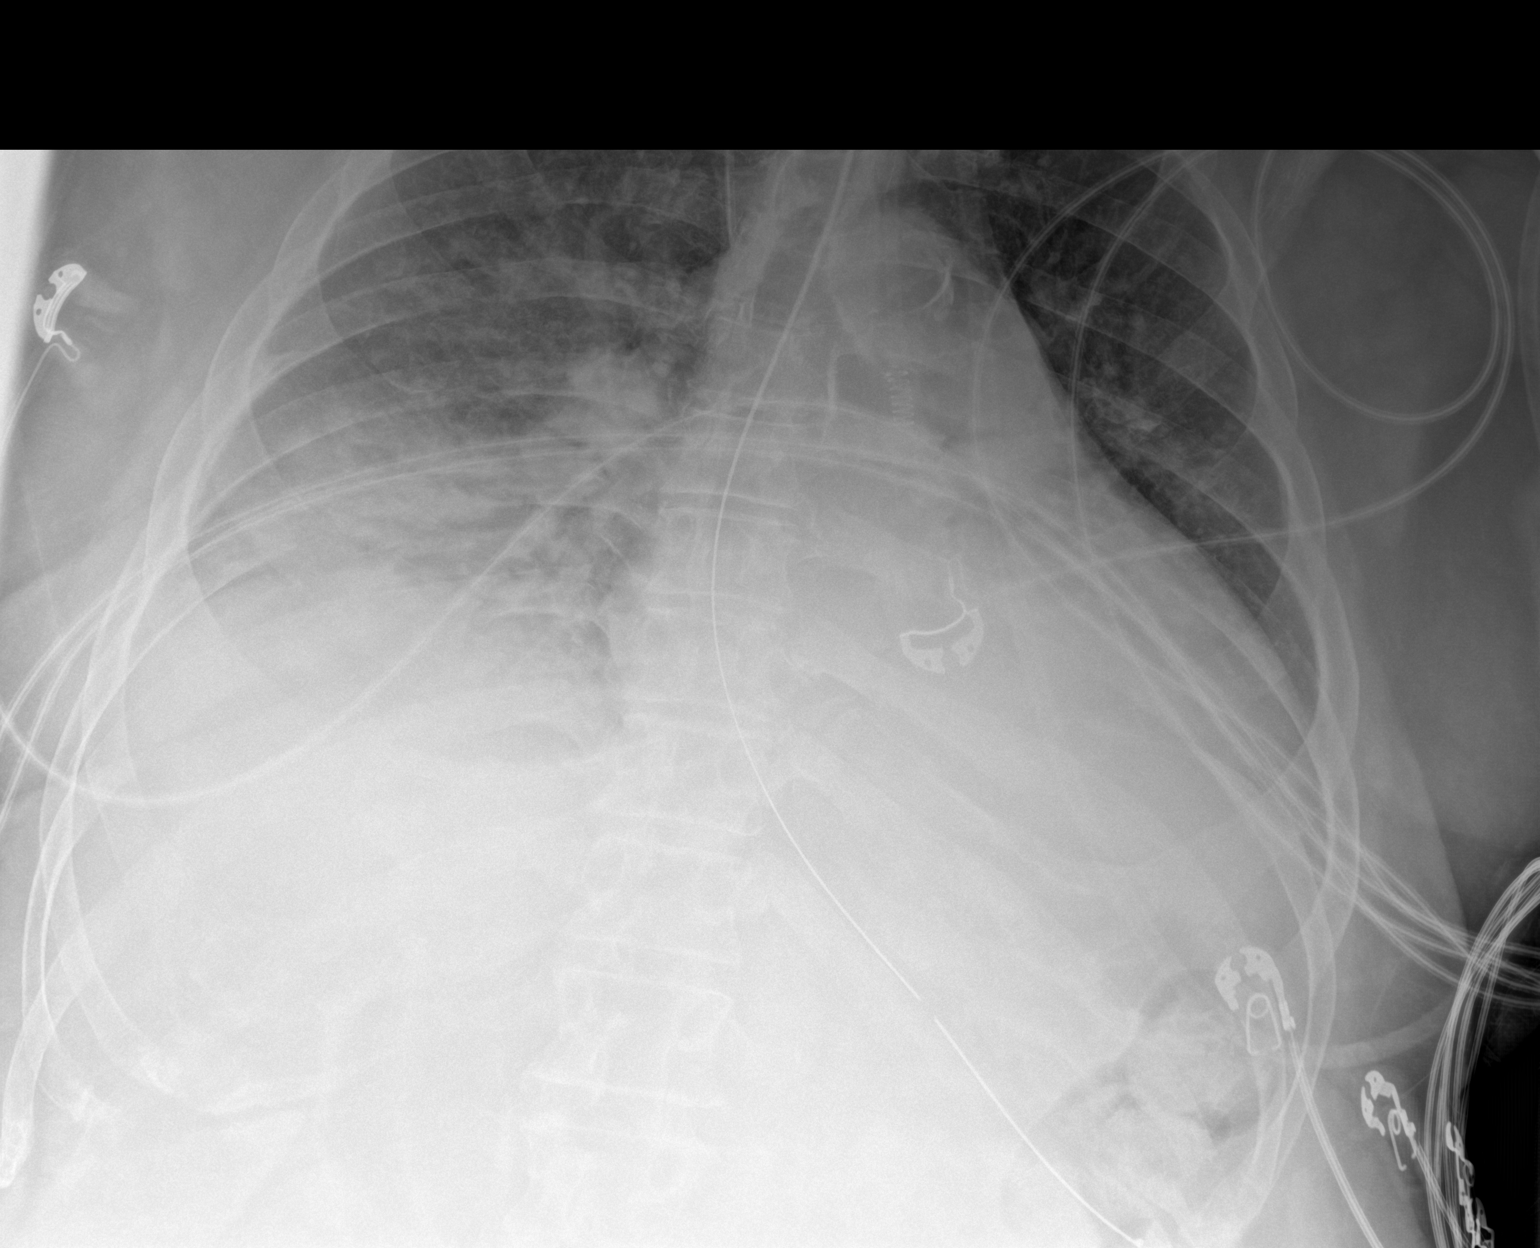

[2 of 2 positions shown; findings below may reference images not displayed]

FINDINGS: Portable AP semi upright view at [88] hours. Stable lines and tubes.
Lower lung volumes. Increased patchy and indistinct right greater
than left mid and lower lung opacity, much of the diaphragm now
obscured. Stable mediastinal contours allowing for lower lung
volumes. No pneumothorax. No acute osseous abnormality identified.
IMPRESSION: 1. Stable lines and tubes.
2. Lower lung volumes with worsening bilateral lower lung
ventilation, and lung opacity now largely obscures the diaphragm.
Differential considerations include increasing pulmonary edema with
atelectasis, increasing pneumonia, ARDS.

## 2020-12-27 IMAGING — MR MR HEAD W/O CM
10 series · 48 of 48 positions shown · non-contrast
Comparison: None.

CLINICAL DATA: Anoxic brain damage.  Cardiac arrest.

EXAM:
MRI HEAD WITHOUT CONTRAST
TECHNIQUE: Multiplanar, multiecho pulse sequences of the brain and surrounding
structures were obtained without intravenous contrast.

[Series 5: ax dwi_tracew · axial · 3.0mm · 1.31mm/px · z∈[-26,+123]mm · 11 of 96 slices shown]
[im 1/96]
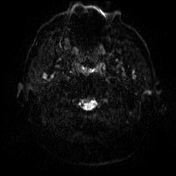
[im 10/96]
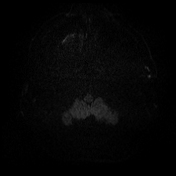
[im 20/96]
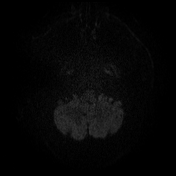
[im 29/96]
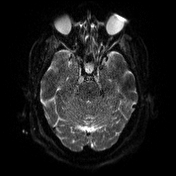
[im 39/96]
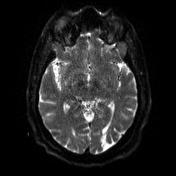
[im 48/96]
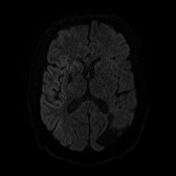
[im 58/96]
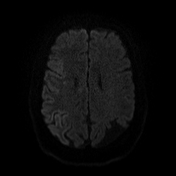
[im 67/96]
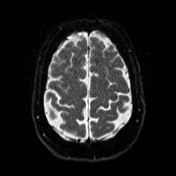
[im 77/96]
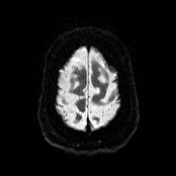
[im 86/96]
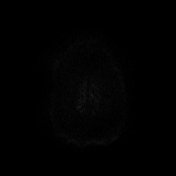
[im 96/96]
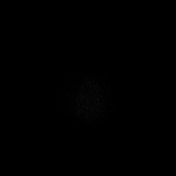

[Series 6: ax dwi_adc · axial · 3.0mm · 1.31mm/px · z∈[-26,+123]mm · 5 of 48 slices shown]
[im 1/48]
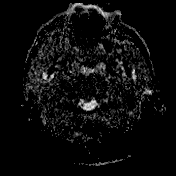
[im 12/48]
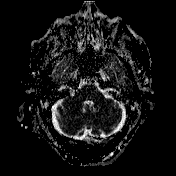
[im 24/48]
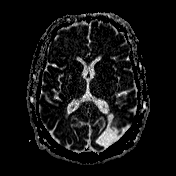
[im 36/48]
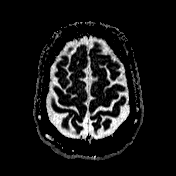
[im 48/48]
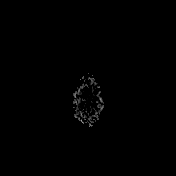

[Series 7: cor dwi_tracew · coronal · 5.0mm · 1.31mm/px · 9 of 76 slices shown]
[im 1/76]
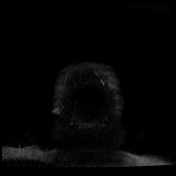
[im 10/76]
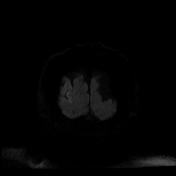
[im 19/76]
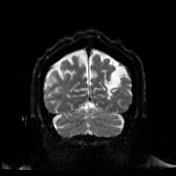
[im 29/76]
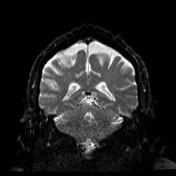
[im 38/76]
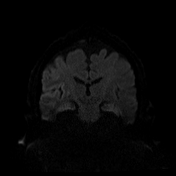
[im 47/76]
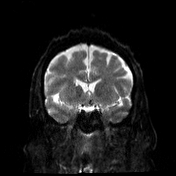
[im 57/76]
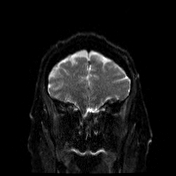
[im 66/76]
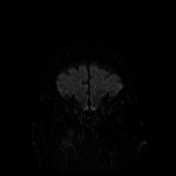
[im 76/76]
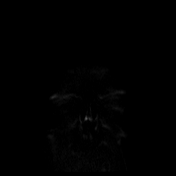

[Series 8: cor dwi_adc · coronal · 5.0mm · 1.31mm/px · 4 of 38 slices shown]
[im 1/38]
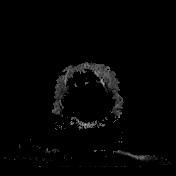
[im 13/38]
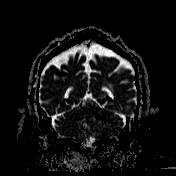
[im 25/38]
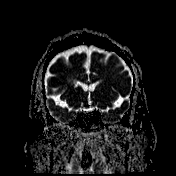
[im 38/38]
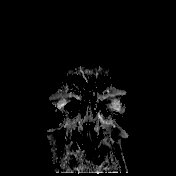

[Series 9: T1 · sagittal · 5.0mm · 0.94mm/px · 3 of 23 slices shown (1 of 2)]
[im 1/23]
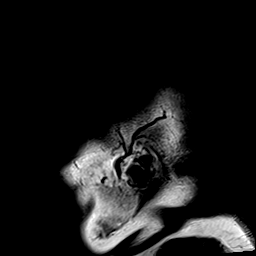
[im 12/23]
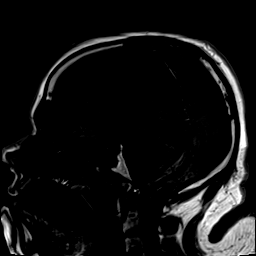
[im 23/23]
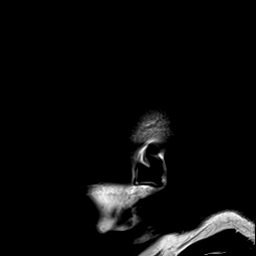

[Series 10: T2 · axial · 5.0mm · 0.45mm/px · z∈[-27,+122]mm · 3 of 27 slices shown (1 of 2)]
[im 1/27]
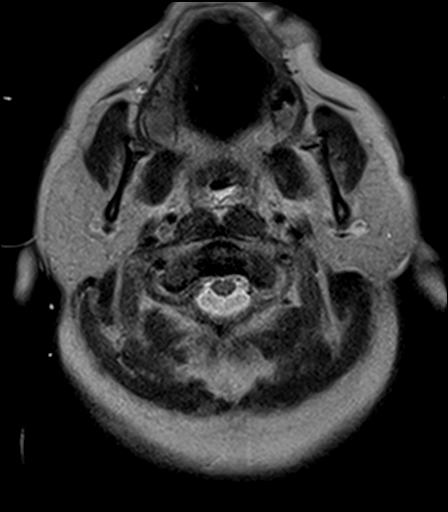
[im 14/27]
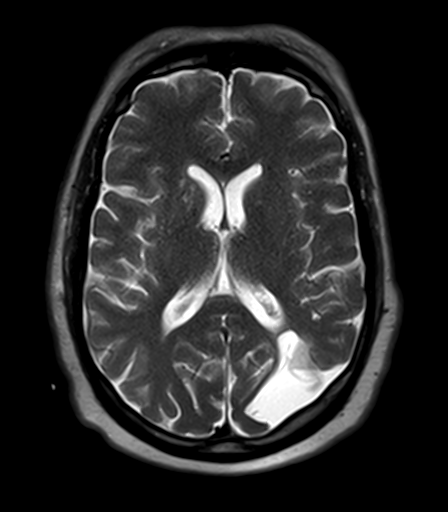
[im 27/27]
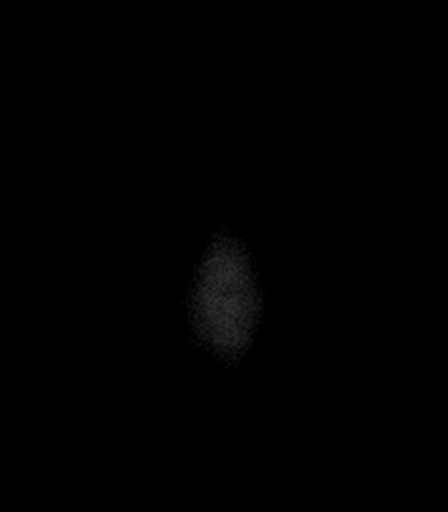

[Series 11: T2-star · axial · 5.0mm · 0.45mm/px · z∈[-27,+122]mm · 3 of 27 slices shown]
[im 1/27]
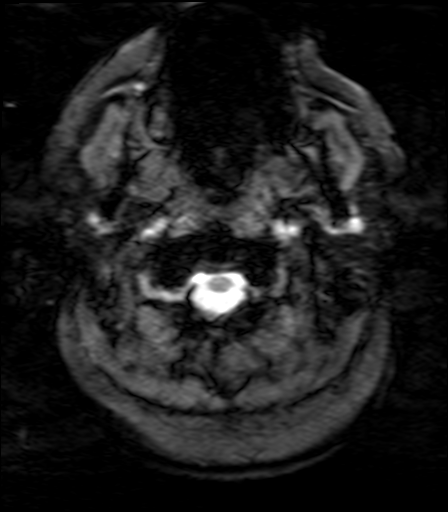
[im 14/27]
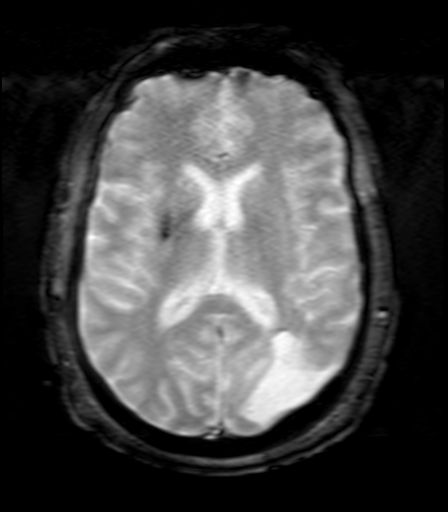
[im 27/27]
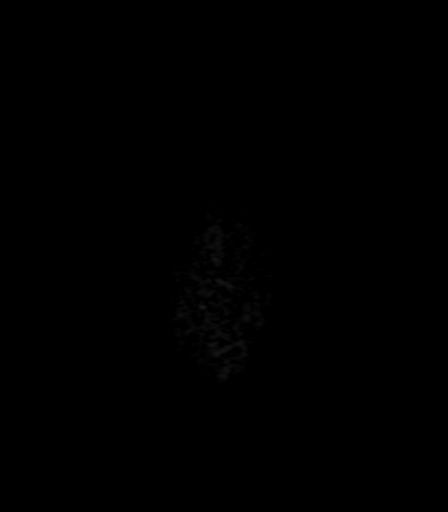

[Series 12: FLAIR · axial · 5.0mm · 1.20mm/px · z∈[-26,+123]mm · 3 of 27 slices shown]
[im 1/27]
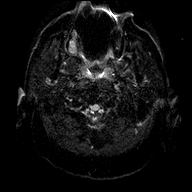
[im 14/27]
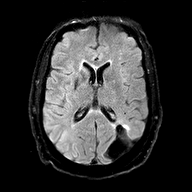
[im 27/27]
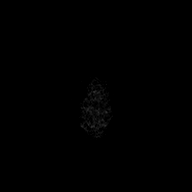

[Series 13: T1 · axial · 5.0mm · 0.90mm/px · z∈[-27,+122]mm · 3 of 27 slices shown (2 of 2)]
[im 1/27]
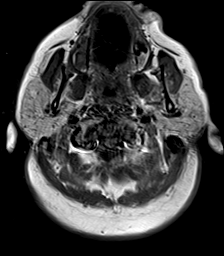
[im 14/27]
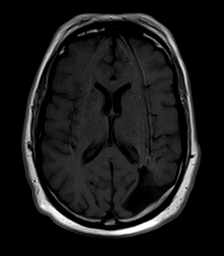
[im 27/27]
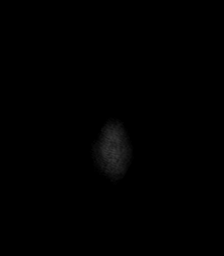

[Series 14: T2 · coronal · 5.0mm · 0.45mm/px · 4 of 31 slices shown (2 of 2)]
[im 1/31]
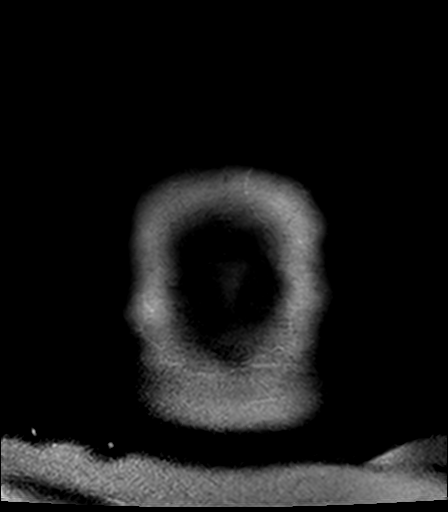
[im 11/31]
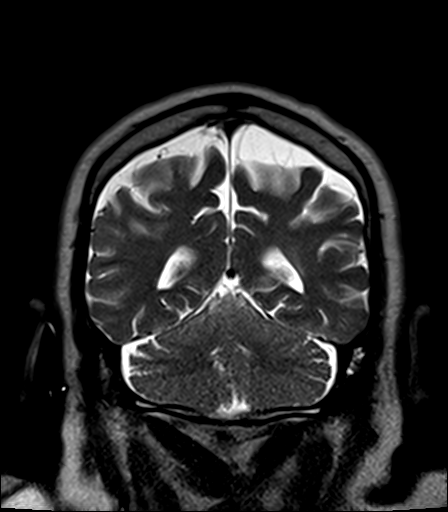
[im 21/31]
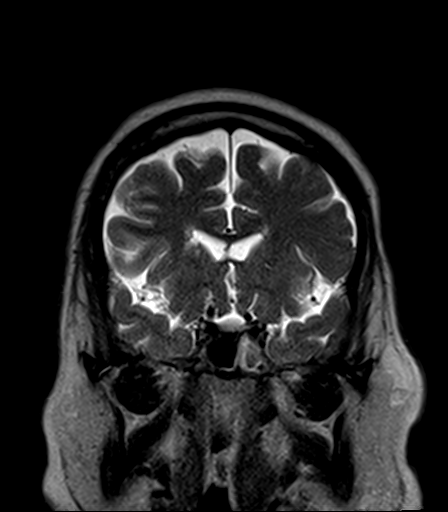
[im 31/31]
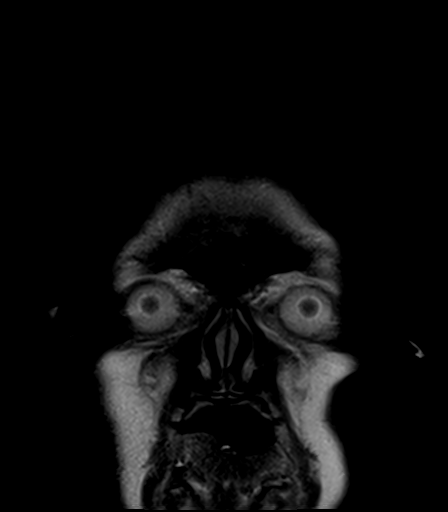

[48 of 48 positions shown; findings below may reference images not displayed]

FINDINGS: Brain: There are subcentimeter acute infarcts in the right
cerebellum, mesial right temporal lobe, right temporal stem, right
basal ganglia, and right frontoparietal subcortical white matter.
Additionally, there is mildly restricted diffusion diffusely
involving cortex laterally over the right cerebral convexity (right
frontal, parietal, and temporal lobes as well as insula in the MCA
territory). There is slight associated cortical T2 FLAIR
hyperintensity mainly in the right parietal lobe without significant
gyral swelling.

There is a chronic infarct in the right basal ganglia with
associated chronic blood products. There is also a moderate-sized
chronic left parieto-occipital infarct. Scattered small T2
hyperintensities elsewhere in the cerebral white matter bilaterally
are nonspecific but compatible with mild chronic small vessel
ischemic disease. The ventricles are normal in size aside from
slight ex vacuo dilatation of the body/frontal horn of the right
lateral ventricle and occipital horn of the left lateral ventricle
related to the chronic infarcts. No mass, midline shift, or
extra-axial fluid collection is identified.

Vascular: Major intracranial vascular flow voids are preserved.

Skull and upper cervical spine: Unremarkable bone marrow signal.

Sinuses/Orbits: Unremarkable orbits. Mucous retention cyst or polyp
in the right maxillary sinus. Mild scattered mucosal thickening in
the paranasal sinuses elsewhere. Small bilateral mastoid effusions.

Other: None.
IMPRESSION: 1. Small acute infarcts in the right cerebellum and right cerebral
hemisphere.
2. Mildly restricted diffusion involving the cortex more diffusely
over the right cerebral convexity favored to reflect an acute MCA
territory infarct over global hypoxic injury given unilaterality.
3. Chronic left parieto-occipital and right basal ganglia infarcts.

## 2020-12-27 MED ORDER — ISOSORBIDE DINITRATE 10 MG PO TABS
10.0000 mg | ORAL_TABLET | Freq: Four times a day (QID) | ORAL | Status: DC
  Administered 2020-12-27: 10 mg via ORAL
  Filled 2020-12-27 (×2): qty 1

## 2020-12-27 MED ORDER — FREE WATER
30.0000 mL | Status: DC
  Administered 2020-12-27 – 2021-01-10 (×81): 30 mL

## 2020-12-27 MED ORDER — DEXTROSE 50 % IV SOLN: 12.5000 g | INTRAVENOUS | Status: AC

## 2020-12-27 MED ORDER — ISOSORBIDE DINITRATE 10 MG PO TABS
10.0000 mg | ORAL_TABLET | Freq: Four times a day (QID) | ORAL | Status: DC
  Administered 2020-12-28 – 2020-12-30 (×5): 10 mg
  Filled 2020-12-27 (×16): qty 1

## 2020-12-27 MED ORDER — PROPOFOL 1000 MG/100ML IV EMUL
INTRAVENOUS | Status: AC
  Administered 2020-12-27: 5 ug/kg/min via INTRAVENOUS
  Filled 2020-12-27: qty 100

## 2020-12-27 MED ORDER — MIDAZOLAM HCL 2 MG/2ML IJ SOLN
INTRAMUSCULAR | Status: AC
  Administered 2020-12-27: 4 mg via INTRAVENOUS
  Filled 2020-12-27: qty 4

## 2020-12-27 MED ORDER — PROPOFOL 1000 MG/100ML IV EMUL
5.0000 ug/kg/min | INTRAVENOUS | Status: DC
  Administered 2020-12-28: 40 ug/kg/min via INTRAVENOUS
  Administered 2020-12-28 – 2020-12-29 (×5): 20 ug/kg/min via INTRAVENOUS
  Administered 2020-12-29: 30 ug/kg/min via INTRAVENOUS
  Administered 2020-12-29: 40 ug/kg/min via INTRAVENOUS
  Administered 2020-12-29: 35 ug/kg/min via INTRAVENOUS
  Administered 2020-12-29: 30 ug/kg/min via INTRAVENOUS
  Administered 2020-12-30: 55 ug/kg/min via INTRAVENOUS
  Administered 2020-12-30: 50 ug/kg/min via INTRAVENOUS
  Administered 2020-12-30 (×3): 55 ug/kg/min via INTRAVENOUS
  Administered 2020-12-30: 50 ug/kg/min via INTRAVENOUS
  Administered 2020-12-30: 55 ug/kg/min via INTRAVENOUS
  Administered 2020-12-30: 30 ug/kg/min via INTRAVENOUS
  Administered 2020-12-30: 55 ug/kg/min via INTRAVENOUS
  Administered 2020-12-30: 30 ug/kg/min via INTRAVENOUS
  Administered 2020-12-31 (×2): 55 ug/kg/min via INTRAVENOUS
  Administered 2020-12-31: 26.497 ug/kg/min via INTRAVENOUS
  Administered 2020-12-31: 65 ug/kg/min via INTRAVENOUS
  Administered 2020-12-31 (×6): 55 ug/kg/min via INTRAVENOUS
  Administered 2021-01-01: 65 ug/kg/min via INTRAVENOUS
  Administered 2021-01-01: 35 ug/kg/min via INTRAVENOUS
  Administered 2021-01-01: 30 ug/kg/min via INTRAVENOUS
  Administered 2021-01-01 (×4): 65 ug/kg/min via INTRAVENOUS
  Administered 2021-01-01: 50 ug/kg/min via INTRAVENOUS
  Administered 2021-01-01 – 2021-01-02 (×2): 65 ug/kg/min via INTRAVENOUS
  Administered 2021-01-02: 60 ug/kg/min via INTRAVENOUS
  Administered 2021-01-02: 65 ug/kg/min via INTRAVENOUS
  Administered 2021-01-02: 80 ug/kg/min via INTRAVENOUS
  Administered 2021-01-02 (×2): 65 ug/kg/min via INTRAVENOUS
  Administered 2021-01-02: 70 ug/kg/min via INTRAVENOUS
  Administered 2021-01-02: 35 ug/kg/min via INTRAVENOUS
  Administered 2021-01-03 (×2): 40 ug/kg/min via INTRAVENOUS
  Administered 2021-01-03: 35 ug/kg/min via INTRAVENOUS
  Administered 2021-01-03: 25 ug/kg/min via INTRAVENOUS
  Administered 2021-01-03 (×2): 35 ug/kg/min via INTRAVENOUS
  Administered 2021-01-03: 40 ug/kg/min via INTRAVENOUS
  Administered 2021-01-04: 45 ug/kg/min via INTRAVENOUS
  Administered 2021-01-04 (×2): 40 ug/kg/min via INTRAVENOUS
  Administered 2021-01-04: 30 ug/kg/min via INTRAVENOUS
  Administered 2021-01-04 (×2): 45 ug/kg/min via INTRAVENOUS
  Administered 2021-01-05 (×4): 35 ug/kg/min via INTRAVENOUS
  Administered 2021-01-05: 10 ug/kg/min via INTRAVENOUS
  Administered 2021-01-05 – 2021-01-06 (×5): 35 ug/kg/min via INTRAVENOUS
  Filled 2020-12-27 (×4): qty 100
  Filled 2020-12-27: qty 200
  Filled 2020-12-27 (×11): qty 100
  Filled 2020-12-27: qty 200
  Filled 2020-12-27 (×52): qty 100
  Filled 2020-12-27: qty 200
  Filled 2020-12-27: qty 100

## 2020-12-27 MED ORDER — POLYETHYLENE GLYCOL 3350 17 G PO PACK: 17.0000 g | PACK | Freq: Every day | ORAL | Status: DC | PRN

## 2020-12-27 MED ORDER — HYDRALAZINE HCL 20 MG/ML IJ SOLN
10.0000 mg | INTRAMUSCULAR | Status: DC | PRN
  Administered 2020-12-27 – 2021-01-21 (×8): 10 mg via INTRAVENOUS
  Filled 2020-12-27 (×8): qty 1

## 2020-12-27 MED ORDER — DOCUSATE SODIUM 50 MG/5ML PO LIQD
100.0000 mg | Freq: Two times a day (BID) | ORAL | Status: DC | PRN
  Filled 2020-12-27: qty 10

## 2020-12-27 MED ORDER — MAGNESIUM SULFATE 2 GM/50ML IV SOLN
2.0000 g | Freq: Once | INTRAVENOUS | Status: AC
  Administered 2020-12-27: 2 g via INTRAVENOUS
  Filled 2020-12-27: qty 50

## 2020-12-27 MED ORDER — HYDRALAZINE HCL 50 MG PO TABS
25.0000 mg | ORAL_TABLET | Freq: Four times a day (QID) | ORAL | Status: DC
  Administered 2020-12-28 – 2020-12-30 (×5): 25 mg
  Filled 2020-12-27 (×5): qty 1

## 2020-12-27 MED ORDER — HYDRALAZINE HCL 50 MG PO TABS
25.0000 mg | ORAL_TABLET | Freq: Four times a day (QID) | ORAL | Status: DC
  Administered 2020-12-27: 25 mg via ORAL
  Filled 2020-12-27: qty 1

## 2020-12-27 MED ORDER — MIDAZOLAM HCL 2 MG/2ML IJ SOLN
2.0000 mg | INTRAMUSCULAR | Status: DC | PRN
  Administered 2020-12-27 – 2020-12-30 (×7): 4 mg via INTRAVENOUS
  Administered 2020-12-31 – 2021-01-02 (×3): 2 mg via INTRAVENOUS
  Administered 2021-01-04: 4 mg via INTRAVENOUS
  Administered 2021-01-05: 3 mg via INTRAVENOUS
  Administered 2021-01-15 – 2021-01-16 (×2): 2 mg via INTRAVENOUS
  Filled 2020-12-27 (×2): qty 4
  Filled 2020-12-27 (×4): qty 2
  Filled 2020-12-27 (×5): qty 4
  Filled 2020-12-27: qty 2
  Filled 2020-12-27: qty 4
  Filled 2020-12-27: qty 2
  Filled 2020-12-27 (×2): qty 4
  Filled 2020-12-27: qty 2
  Filled 2020-12-27: qty 4

## 2020-12-27 MED ORDER — VITAL AF 1.2 CAL PO LIQD
1000.0000 mL | ORAL | Status: AC
  Administered 2020-12-27: 1000 mL

## 2020-12-27 MED ORDER — DEXTROSE 50 % IV SOLN
INTRAVENOUS | Status: AC
  Administered 2020-12-27: 12.5 g via INTRAVENOUS
  Filled 2020-12-27: qty 50

## 2020-12-27 MED ORDER — MIDAZOLAM HCL (PF) 5 MG/ML IJ SOLN: 2.0000 mg | INTRAMUSCULAR | Status: DC | PRN

## 2020-12-27 MED ORDER — FENTANYL CITRATE (PF) 100 MCG/2ML IJ SOLN
25.0000 ug | INTRAMUSCULAR | Status: DC | PRN
  Administered 2020-12-27 – 2020-12-29 (×5): 100 ug via INTRAVENOUS
  Administered 2020-12-29 – 2020-12-30 (×3): 50 ug via INTRAVENOUS
  Administered 2020-12-31 – 2021-01-13 (×2): 100 ug via INTRAVENOUS
  Administered 2021-01-14: 50 ug via INTRAVENOUS
  Administered 2021-01-28: 25 ug via INTRAVENOUS
  Administered 2021-01-28: 100 ug via INTRAVENOUS
  Filled 2020-12-27 (×14): qty 2

## 2020-12-27 MED ORDER — PROSOURCE TF PO LIQD
45.0000 mL | Freq: Three times a day (TID) | ORAL | Status: DC
  Administered 2020-12-27 – 2020-12-29 (×5): 45 mL
  Filled 2020-12-27: qty 45

## 2020-12-27 NOTE — Progress Notes (Signed)
Progress Note  Patient Name: Jeremy Browning Date of Encounter: 12/27/2020  Primary Cardiologist: None  Subjective   Intubated.  Off sedation since 5/8.  No purposeful mvmt.  Inpatient Medications    Scheduled Meds: . chlorhexidine gluconate (MEDLINE KIT)  15 mL Mouth Rinse BID  . Chlorhexidine Gluconate Cloth  6 each Topical Q0600  . docusate  100 mg Per Tube BID  . famotidine  20 mg Per Tube Daily  . fentaNYL (SUBLIMAZE) injection  50 mcg Intravenous Once  . heparin  5,000 Units Subcutaneous Q8H  . insulin aspart  0-15 Units Subcutaneous Q4H  . mouth rinse  15 mL Mouth Rinse 10 times per day  . polyethylene glycol  17 g Per Tube Daily  . sodium chloride flush  10-40 mL Intracatheter Q12H  . sodium chloride flush  3 mL Intravenous Q12H   Continuous Infusions: . sodium chloride    . sodium chloride    . ampicillin-sulbactam (UNASYN) IV 1.5 g (12/27/20 0831)  . fentaNYL infusion INTRAVENOUS Stopped (12/26/20 1440)  . midazolam Stopped (12/26/20 1309)  . norepinephrine (LEVOPHED) Adult infusion Stopped (12/27/20 0549)   PRN Meds: sodium chloride, albuterol, docusate sodium, fentaNYL, fentaNYL (SUBLIMAZE) injection, midazolam, polyethylene glycol, sodium chloride flush, sodium chloride flush   Vital Signs    Vitals:   12/27/20 1000 12/27/20 1030 12/27/20 1100 12/27/20 1130  BP: (!) 179/82  (!) 147/56   Pulse: 80 66 69 90  Resp: _0 (!) 24  Temp: 98.06 F (36.7 C) 97.88 F (36.6 C) 97.88 F (36.6 C) 97.7 F (36.5 C)  TempSrc:      SpO2: 98% 99% 99% 99%  Weight:        Intake/Output Summary (Last 24 hours) at 12/27/2020 1134 Last data filed at 12/27/2020 0800 Gross per 24 hour  Intake 1384 ml  Output 1740 ml  Net -356 ml   Filed Weights   12/25/20 1824 12/26/20 0426 12/27/20 0412  Weight: 127.7 kg 127.8 kg 125.8 kg    Physical Exam   GEN: Obese.  Intubated.  No purposeful mvmt. HEENT: Grossly normal.  Neck: Supple, obese, difficult to gauge JVP.  No  carotid bruits, or masses. Cardiac: RRR, no murmurs, rubs, or gallops. No clubbing, cyanosis.  Trace to 1+ bilat LE edema.  DP/PT 1+ and equal bilaterally.  Respiratory:  Respirations regular and unlabored, coarse, ventilated breath sounds bilat.  Occas exp wheeze.   GI: semi-firm and protuberant. BS + x 4. MS: no deformity or atrophy. Skin: warm and dry, no rash. Neuro:  Unresponsive. Psych: unresponsive.  Labs    Chemistry Recent Labs  Lab 12/25/20 1545 12/25/20 1651 12/26/20 0420 12/27/20 0407  NA 135  --  135 136  K 5.5* 5.5* 5.4* 4.0  CL 106  --  108 111  CO2 23  --  18* 22  GLUCOSE 267*  --  160* 114*  BUN 21*  --  27* 29*  CREATININE 2.16*  --  2.53* 2.35*  CALCIUM 8.0*  --  7.7* 7.8*  PROT 7.9  --  7.4  --   ALBUMIN 3.2*  --  3.0*  --   AST 85*  --  73*  --   ALT 58*  --  46*  --   ALKPHOS 82  --  62  --   BILITOT 0.6  --  0.8  --   GFRNONAA 34*  --  28* 31*  ANIONGAP 6  --  9 3*  Hematology Recent Labs  Lab 12/25/20 1451 12/26/20 0420 12/27/20 0407  WBC 17.7* 13.2* 8.8  RBC 5.59 5.13 4.21*  HGB 14.0 12.8* 10.7*  HCT 46.6 40.7 32.8*  MCV 83.4 79.3* 77.9*  MCH 25.0* 25.0* 25.4*  MCHC 30.0 31.4 32.6  RDW 16.8* 16.8* 17.0*  PLT 171 243 155    Cardiac Enzymes  Recent Labs  Lab 12/25/20 1451 12/25/20 1651  TROPONINIHS 310* 2,479*      BNP Recent Labs  Lab 12/25/20 1451 12/26/20 0420  BNP 274.6* 687.3*   Lipids  Lab Results  Component Value Date   CHOL 149 12/26/2020   HDL 36 (L) 12/26/2020   LDLCALC 95 12/26/2020   TRIG 92 12/26/2020   CHOLHDL 4.1 12/26/2020    HbA1c  Lab Results  Component Value Date   HGBA1C 5.9 (H) 12/25/2020    Radiology    US RENAL  Result Date: 12/26/2020 CLINICAL DATA:  Acute renal failure EXAM: RENAL / URINARY TRACT ULTRASOUND COMPLETE COMPARISON:  None. FINDINGS: Right Kidney: Renal measurements: 9.2 x 4.8 x 7.0 cm. = volume: 159 mL. 3.4 cm simple cyst is noted in the upper pole centrally. No mass  lesion or hydronephrosis is noted. Mild increased echogenicity is noted. Left Kidney: Renal measurements: 10.4 x 5.7 x 4.6 cm. = volume: 143 mL. 2.1 cm mid polar renal cyst is noted simple in nature. Mild increased echogenicity is noted. Bladder: Decompressed secondary to Foley catheter. Other: None. IMPRESSION: Mild increased echogenicity is noted bilaterally. Bilateral simple cysts are seen. Electronically Signed   By: Inez Catalina M.D.   On: 12/26/2020 15:23   DG Chest Port 1 View  Result Date: 12/27/2020 CLINICAL DATA:  60 year old male with cardiogenic shock. Acute renal failure. EXAM: PORTABLE CHEST 1 VIEW COMPARISON:  Portable chest 12/26/2020 and earlier. FINDINGS: Portable AP semi upright view at 0443 hours. Stable lines and tubes. Lower lung volumes. Increased patchy and indistinct right greater than left mid and lower lung opacity, much of the diaphragm now obscured. Stable mediastinal contours allowing for lower lung volumes. No pneumothorax. No acute osseous abnormality identified. IMPRESSION: 1. Stable lines and tubes. 2. Lower lung volumes with worsening bilateral lower lung ventilation, and lung opacity now largely obscures the diaphragm. Differential considerations include increasing pulmonary edema with atelectasis, increasing pneumonia, ARDS. Electronically Signed   By: Genevie Ann M.D.   On: 12/27/2020 05:03   DG Chest Port 1 View  Result Date: 12/26/2020 CLINICAL DATA:  respiratory distress that had been progressively worsening over the course of 5/7/22Now in ICU intubatedHx CHF, DM EXAM: PORTABLE CHEST 1 VIEW COMPARISON:  Three exams on 12/25/2020. FINDINGS: Airspace opacities have decreased from the prior exams, particularly becoming less confluent in the right mid and lower lung. No new lung abnormalities. No convincing pleural effusion and no pneumothorax. Endotracheal tube, nasal/orogastric tube and right internal jugular central venous line are stable. Heart is normal in size.  IMPRESSION: 1. Improved airspace lung opacities compared to the previous day's exams, consistent with improved pulmonary edema. 2. Stable support apparatus from the most recent prior study. Electronically Signed   By: Lajean Manes M.D.   On: 12/26/2020 08:28   DG Chest Port 1 View  Result Date: 12/25/2020 CLINICAL DATA:  60 year old male with central line placement. EXAM: PORTABLE CHEST 1 VIEW COMPARISON:  Chest radiograph dated 12/25/2020. FINDINGS: Endotracheal tube above the carina in similar position. Enteric tube extends below the diaphragm with tip in the proximal stomach. There has been retraction of  the Swan-Ganz catheter with tip of the catheter now over the upper SVC. Diffuse bilateral pulmonary opacities similar or slightly improved on the right. A small left pleural effusion may be present. No pneumothorax. Stable cardiomegaly. No acute osseous pathology. IMPRESSION: 1. Interval retraction of the Swan-Ganz catheter with tip now over the upper SVC. 2. Diffuse bilateral pulmonary opacities, similar or slightly improved on the right. Electronically Signed   By: Anner Crete M.D.   On: 12/25/2020 21:20   DG Chest Port 1 View  Result Date: 12/25/2020 CLINICAL DATA:  Shortness of breath, status post Swan-Ganz placement EXAM: PORTABLE CHEST 1 VIEW COMPARISON:  Film from earlier in the same day. FINDINGS: Cardiac shadow is stable. Swan-Ganz catheter is noted via the right jugular approach with the tip in the right pulmonary artery. Endotracheal tube and gastric catheter are noted in satisfactory position. Persistent bilateral airspace opacity is noted right considerably greater than left likely representing edema. Continued follow-up is recommended. No pneumothorax is noted. IMPRESSION: No pneumothorax following Swan-Ganz placement. The remainder of the exam is stable. Electronically Signed   By: Inez Catalina M.D.   On: 12/25/2020 19:28   DG Chest Portable 1 View  Result Date: 12/25/2020 CLINICAL  DATA:  Intubated. Respiratory distress. Status post cardiac arrest. EXAM: PORTABLE CHEST 1 VIEW COMPARISON:  None. FINDINGS: Enlarged cardiac silhouette. Endotracheal tube in satisfactory position. Nasogastric tube extending into stomach with the side hole visible in the proximal stomach. Extensive bilateral airspace opacity, most pronounced in the right lower lung zone. No pleural fluid. Unremarkable bones. IMPRESSION: 1. Extensive bilateral airspace opacity most pronounced in right lower lung zone. Could represent pulmonary hemorrhage, pneumonia/or alveolar edema. 2. Cardiomegaly. 3. Endotracheal tube satisfactory position Electronically Signed   By: Claudie Revering M.D.   On: 12/25/2020 15:15    Telemetry    Regular sinus rhythm- Personally Reviewed  Cardiac Studies   LHC 12/25/2020:  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  LV End Diastolic Pressure and Pulmonary Capillary Wedge Pressure are both severely elevated.  There is moderate left ventricular systolic dysfunction. The left ventricular ejection fraction is 35-45% by visual estimate.  Respiratory acidosis with pH of 7.0 and PCO2 of 93.   POST-OPERATIVE DIAGNOSIS:   Angiographically normal coronary arteries with no obvious culprit lesion to explain heart failure this level of hypoxia or hypercapnia.  Acute combined systolic and diastolic heart failure: EF of roughly 40 to 45% with global hypokinesis: PCWP and LVEDP of 30 mmHg.   ? Cardiac Output and Index relatively normal: CO 6.54, CI 2.82 (mildly reduced). ? AoP-MAP 97/59 mmHg - 71 mmHg; LVP-EDP 101/21-30 mmHg.;  PCWP 30 mmHg, PAP 47/17 mmHg-mean 33 mmHg.  RAP mean 22 mmHg. ? Secondary Pulmonary Hypertension     RECOMMENDATIONS  Patient needs ICU care with aggressive pulmonary management to improve oxygenation and ventilation.  The presence of acute renal failure and lactic acidosis precludes [facility] per Dr. Haroldine Laws.  He  recommends aggressive pulmonary management and diuresis.  May require milrinone.  Agree with Levophed for blood pressure assistance.   Major concern is ocation of care-he likely will need to be transferred to higher level care center.  Currently working on bed availability at either [facility] or [facility]. _____________   2D Echocardiogram 5.9.2022  1. Left ventricular ejection fraction, by estimation, is 45 to 50%. The  left ventricle has mildly decreased function. The left ventricle  demonstrates global hypokinesis. The left ventricular internal cavity size  was mildly to moderately dilated. There  is mild left ventricular hypertrophy. Left ventricular diastolic  parameters are consistent with Grade I diastolic dysfunction (impaired  relaxation).   2. Right ventricular systolic function is normal. The right ventricular  size is normal.   3. Left atrial size was mildly dilated.  _____________    Patient Profile     60 y.o. male with history of presumed cardiomyopathy, DM, and venous stasis with further details unclear admitted with acute hypoxic and hypercapnic respiratory failure in the context of flash pulmonary edema complicated by AKI.    Assessment & Plan    1.  Acute hypoxic and hypercapnic respiratory failure secondary to flash pulmonary edema/VT arrest/AV block: Status post defibrillation x1 and CPR.  Noted to be hypertensive on upon presentation with Mobitz 2 in the ED, presumed to be metabolic in etiology.  He remains intubated.  Has been off of sedation since May 8 and has not shown purposeful movement.  Neurology to see.  Vent management per critical care.  2.  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  He is listed as 1.4 L positive though his weight is recorded as down 2 kg since yesterday.  Echo earlier today shows EF 45-50% with global hypokinesis and grade 1 diastolic  dysfunction.  Chest x-ray this morning worsening airspace disease.  Trace to 1+ lower extremity edema on examination.  Wedge was elevated at 30 on cath however, CVP's trending in the 9-12 range today.    3.  Non-STEMI: Troponin to 2,479 on May 7.  No significant coronary disease to explain troponin rise-likely demand ischemia in the setting of respiratory failure.  EF 45 to 50% with global hypokinesis.  He has not received beta-blocker secondary to concerns related to Mobitz 1 and 2, which are not present on telemetry overnight.  Consider aspirin via NG tube.    4.  Mobitz 1 and 2 heart block: No additional Mobitz 2 noted, apparently seen in ED.  Follow.  5.  Acute on chronic renal failure: BUN and creatinine relatively stable this morning.  Nephrology following.  6.  Hypotension/hypertension: Hypotension has resolved and he is now hypertensive.  Follow and initiate therapy if stable.  Signed, Murray Hodgkins, NP  12/27/2020, 11:34 AM    For questions or updates, please contact   Please consult [email or web address] for contact info under Cardiology/STEMI.

## 2020-12-27 NOTE — Progress Notes (Signed)
[facility]  ROUNDING NOTE   Subjective:   UOP 1529m.   Creatinine 2.35 (2.53) K 5.4 (4)  Weaned off norepinephrine yesterday.  Sisters at bedside yesterday afternoon.   Objective:  Vital signs in last 24 hours:  Temp:  [98.6 F (37 C)-100.4 F (38 C)] 98.6 F (37 C) (05/09 0800) Pulse Rate:  [51-84] 79 (05/09 0800) Resp:  [8-26] 18 (05/09 0800) BP: (100-163)/(30-66) 163/66 (05/09 0800) SpO2:  [91 %-100 %] 98 % (05/09 0800) Arterial Line BP: (109-212)/(42-104) 157/62 (05/09 0745) FiO2 (%):  [28 %-40 %] 28 % (05/09 0800) Weight:  [125.8 kg] 125.8 kg (05/09 0412)  Weight change: 5.8 kg Filed Weights   12/25/20 1824 12/26/20 0426 12/27/20 0412  Weight: 127.7 kg 127.8 kg 125.8 kg    Intake/Output: I/O last 3 completed shifts: In: 3748.3 [I.V.:2248.1; IV Piggyback:1500.2] Out: 2230 [Urine:1880; Emesis/NG output:350]   Intake/Output this shift:  Total I/O In: -  Out: 200 [Urine:200]  Physical Exam: General: Critically care  Head: ETT   Eyes: Eyes closed  Neck: trachea midline  Lungs:  PRVC FiO2 28%  Heart: Regular rate and rhythm  Abdomen:  Soft   Extremities:  + peripheral edema.  Neurologic: No neurologic response, off sedation  Skin: No lesions  Access: Left femoral vascath 5/8    Basic Metabolic Panel: Recent Labs  Lab 12/25/20 1545 12/25/20 1651 12/25/20 1905 12/26/20 0420 12/27/20 0407  NA 135  --   --  135 136  K 5.5* 5.5*  --  5.4* 4.0  CL 106  --   --  108 111  CO2 23  --   --  18* 22  GLUCOSE 267*  --   --  160* 114*  BUN 21*  --   --  27* 29*  CREATININE 2.16*  --   --  2.53* 2.35*  CALCIUM 8.0*  --   --  7.7* 7.8*  MG  --   --  2.1 1.7 1.9  PHOS  --   --  7.9* 4.0 2.5    Liver Function Tests: Recent Labs  Lab 12/25/20 1545 12/26/20 0420  AST 85* 73*  ALT 58* 46*  ALKPHOS 82 62  BILITOT 0.6 0.8  PROT 7.9 7.4  ALBUMIN 3.2* 3.0*   No results for input(s): LIPASE, AMYLASE in the last 168 hours. No results for  input(s): AMMONIA in the last 168 hours.  CBC: Recent Labs  Lab 12/25/20 1451 12/26/20 0420 12/27/20 0407  WBC 17.7* 13.2* 8.8  NEUTROABS 5.1  --   --   HGB 14.0 12.8* 10.7*  HCT 46.6 40.7 32.8*  MCV 83.4 79.3* 77.9*  PLT 171 243 155    Cardiac Enzymes: No results for input(s): CKTOTAL, CKMB, CKMBINDEX, TROPONINI in the last 168 hours.  BNP: Invalid input(s): POCBNP  CBG: Recent Labs  Lab 12/26/20 2334 12/27/20 0326 12/27/20 0726 12/27/20 0756 12/27/20 0803  GLUCAP 121* 107* 64* [id number]*    Microbiology: Results for orders placed or performed during the hospital encounter of 12/25/20  Culture, blood (routine x 2)     Status: None (Preliminary result)   Collection Time: 12/25/20  2:55 PM   Specimen: BLOOD  Result Value Ref Range Status   Specimen Description BLOOD BLOOD RIGHT HAND  Final   Special Requests   Final    BOTTLES DRAWN AEROBIC AND ANAEROBIC Blood Culture adequate volume   Culture  Setup Time   Final    Organism ID to follow GRAM  POSITIVE COCCI AEROBIC BOTTLE ONLY CRITICAL RESULT CALLED TO, READ BACK BY AND VERIFIED WITH: MORGAN CUNNIGHAN AT 9163 ON 12/26/20 BY SS Performed at [facility], [address] [place], [place] [address]    Culture [facility] POSITIVE COCCI  Final   Report Status PENDING  Incomplete  Blood Culture ID Panel (Reflexed)     Status: Abnormal   Collection Time: 12/25/20  2:55 PM  Result Value Ref Range Status   Enterococcus faecalis NOT DETECTED NOT DETECTED Final   Enterococcus Faecium NOT DETECTED NOT DETECTED Final   Listeria monocytogenes NOT DETECTED NOT DETECTED Final   Staphylococcus species DETECTED (A) NOT DETECTED Final    Comment: CRITICAL RESULT CALLED TO, READ BACK BY AND VERIFIED WITH: MORGAN CUNNINGHAN AT 1159 ON 12/26/20 BY SS    Staphylococcus aureus (BCID) NOT DETECTED NOT DETECTED Final   Staphylococcus epidermidis NOT DETECTED NOT DETECTED Final   Staphylococcus lugdunensis NOT DETECTED NOT DETECTED  Final   Streptococcus species NOT DETECTED NOT DETECTED Final   Streptococcus agalactiae NOT DETECTED NOT DETECTED Final   Streptococcus pneumoniae NOT DETECTED NOT DETECTED Final   Streptococcus pyogenes NOT DETECTED NOT DETECTED Final   A.calcoaceticus-baumannii NOT DETECTED NOT DETECTED Final   Bacteroides fragilis NOT DETECTED NOT DETECTED Final   Enterobacterales NOT DETECTED NOT DETECTED Final   Enterobacter cloacae complex NOT DETECTED NOT DETECTED Final   Escherichia coli NOT DETECTED NOT DETECTED Final   Klebsiella aerogenes NOT DETECTED NOT DETECTED Final   Klebsiella oxytoca NOT DETECTED NOT DETECTED Final   Klebsiella pneumoniae NOT DETECTED NOT DETECTED Final   Proteus species NOT DETECTED NOT DETECTED Final   Salmonella species NOT DETECTED NOT DETECTED Final   Serratia marcescens NOT DETECTED NOT DETECTED Final   Haemophilus influenzae NOT DETECTED NOT DETECTED Final   Neisseria meningitidis NOT DETECTED NOT DETECTED Final   Pseudomonas aeruginosa NOT DETECTED NOT DETECTED Final   Stenotrophomonas maltophilia NOT DETECTED NOT DETECTED Final   Candida albicans NOT DETECTED NOT DETECTED Final   Candida auris NOT DETECTED NOT DETECTED Final   Candida glabrata NOT DETECTED NOT DETECTED Final   Candida krusei NOT DETECTED NOT DETECTED Final   Candida parapsilosis NOT DETECTED NOT DETECTED Final   Candida tropicalis NOT DETECTED NOT DETECTED Final   Cryptococcus neoformans/gattii NOT DETECTED NOT DETECTED Final    Comment: Performed at [facility], [address] [place], [place] [address]  Resp Panel by RT-PCR (Flu A&B, Covid) Nasopharyngeal Swab     Status: None   Collection Time: 12/25/20  5:51 PM   Specimen: Nasopharyngeal Swab; Nasopharyngeal(NP) swabs in vial transport medium  Result Value Ref Range Status   SARS Coronavirus 2 by RT PCR NEGATIVE NEGATIVE Final    Comment: (NOTE) SARS-CoV-2 target nucleic acids are NOT DETECTED.  The SARS-CoV-2 RNA is  generally detectable in upper respiratory specimens during the acute phase of infection. The lowest concentration of SARS-CoV-2 viral copies this assay can detect is 138 copies/mL. A negative result does not preclude SARS-Cov-2 infection and should not be used as the sole basis for treatment or other patient management decisions. A negative result may occur with  improper specimen collection/handling, submission of specimen other than nasopharyngeal swab, presence of viral mutation(s) within the areas targeted by this assay, and inadequate number of viral copies(<138 copies/mL). A negative result must be combined with clinical observations, patient history, and epidemiological information. The expected result is Negative.  Fact Sheet for Patients:  EntrepreneurPulse.com.au  Fact Sheet for Healthcare  Providers:  IncredibleEmployment.be  This test is no t yet approved or cleared by the [place] and  has been authorized for detection and/or diagnosis of SARS-CoV-2 by FDA under an Emergency Use Authorization (EUA). This EUA will remain  in effect (meaning this test can be used) for the duration of the COVID-19 declaration under Section 564(b)(1) of the Act, 21 U.S.C.section 360bbb-3(b)(1), unless the authorization is terminated  or revoked sooner.       Influenza A by PCR NEGATIVE NEGATIVE Final   Influenza B by PCR NEGATIVE NEGATIVE Final    Comment: (NOTE) The Xpert Xpress SARS-CoV-2/FLU/RSV plus assay is intended as an aid in the diagnosis of influenza from Nasopharyngeal swab specimens and should not be used as a sole basis for treatment. Nasal washings and aspirates are unacceptable for Xpert Xpress SARS-CoV-2/FLU/RSV testing.  Fact Sheet for Patients: EntrepreneurPulse.com.au  Fact Sheet for Healthcare Providers: IncredibleEmployment.be  This test is not yet approved or cleared by the [place] FDA and has been authorized for detection and/or diagnosis of SARS-CoV-2 by FDA under an Emergency Use Authorization (EUA). This EUA will remain in effect (meaning this test can be used) for the duration of the COVID-19 declaration under Section 564(b)(1) of the Act, 21 U.S.C. section 360bbb-3(b)(1), unless the authorization is terminated or revoked.  Performed at [facility], [address] [place], [place] [address]   MRSA PCR Screening     Status: None   Collection Time: 12/25/20  6:39 PM   Specimen: Nasopharyngeal  Result Value Ref Range Status   MRSA by PCR NEGATIVE NEGATIVE Final    Comment:        The GeneXpert MRSA Assay (FDA approved for NASAL specimens only), is one component of a comprehensive MRSA colonization surveillance program. It is not intended to diagnose MRSA infection nor to guide or monitor treatment for MRSA infections. Performed at [facility], [address] [place], [place] [address]   Culture, blood (routine x 2)     Status: None (Preliminary result)   Collection Time: 12/25/20  6:43 PM   Specimen: BLOOD  Result Value Ref Range Status   Specimen Description BLOOD Blood Culture adequate volume  Final   Special Requests   Final    BOTTLES DRAWN AEROBIC AND ANAEROBIC LEFT ANTECUBITAL   Culture   Final    NO GROWTH 2 DAYS Performed at [facility], [address] [place], [place] [address]    Report Status PENDING  Incomplete  Culture, Respiratory w Gram Stain     Status: None (Preliminary result)   Collection Time: 12/26/20  6:27 AM   Specimen: Tracheal Aspirate; Respiratory  Result Value Ref Range Status   Specimen Description   Final    TRACHEAL ASPIRATE Performed at [facility], [address], [place], [place] [address]    Special Requests   Final    NONE Performed at [facility], [address] [place], [place] [address]    Gram Stain PENDING  Incomplete   Culture   Final     CULTURE REINCUBATED FOR BETTER GROWTH Performed at [facility], [address], [place], [place] [address]    Report Status PENDING  Incomplete    Coagulation Studies: Recent Labs    12/25/20 1451  LABPROT 14.7  INR 1.2    Urinalysis: Recent Labs    12/25/20 1839  COLORURINE YELLOW*  LABSPEC 1.017  PHURINE 5.0  GLUCOSEU NEGATIVE  HGBUR LARGE*  BILIRUBINUR NEGATIVE  KETONESUR NEGATIVE  PROTEINUR 100*  NITRITE NEGATIVE  LEUKOCYTESUR NEGATIVE      Imaging: CARDIAC CATHETERIZATION  Result Date: 12/25/2020  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  LV End Diastolic Pressure and Pulmonary Capillary Wedge Pressure are both severely elevated.  There is moderate left ventricular systolic dysfunction. The left ventricular ejection fraction is 35-45% by visual estimate.  Respiratory acidosis with pH of 7.0 and PCO2 of 93.  POST-OPERATIVE DIAGNOSIS:  Angiographically normal coronary arteries with no obvious culprit lesion to explain heart failure this level of hypoxia or hypercapnia.  Acute combined systolic and diastolic heart failure: EF of roughly 40 to 45% with global hypokinesis: PCWP and LVEDP of 30 mmHg.  ? Cardiac Output and Index relatively normal: CO 6.54, CI 2.82 (mildly reduced). ? AoP-MAP 97/59 mmHg - 71 mmHg; LVP-EDP 101/21-30 mmHg.;  PCWP 30 mmHg, PAP 47/17 mmHg-mean 33 mmHg.  RAP mean 22 mmHg. ? Secondary Pulmonary Hypertension RECOMMENDATIONS  Patient needs ICU care with aggressive pulmonary management to improve oxygenation and ventilation.  The presence of acute renal failure and lactic acidosis precludes his candid[facility] per Dr. Haroldine Laws.  He recommends aggressive pulmonary management and diuresis.  May require milrinone.  Agree with Levophed for blood pressure assistance. Major concern is ocation of care-he likely will need to be transferred to higher level care center.  Currently working on bed availability at  either [facility] or [facility], [place]  US RENAL  Result Date: 12/26/2020 CLINICAL DATA:  Acute renal failure EXAM: RENAL / URINARY TRACT ULTRASOUND COMPLETE COMPARISON:  None. FINDINGS: Right Kidney: Renal measurements: 9.2 x 4.8 x 7.0 cm. = volume: 159 mL. 3.4 cm simple cyst is noted in the upper pole centrally. No mass lesion or hydronephrosis is noted. Mild increased echogenicity is noted. Left Kidney: Renal measurements: 10.4 x 5.7 x 4.6 cm. = volume: 143 mL. 2.1 cm mid polar renal cyst is noted simple in nature. Mild increased echogenicity is noted. Bladder: Decompressed secondary to Foley catheter. Other: None. IMPRESSION: Mild increased echogenicity is noted bilaterally. Bilateral simple cysts are seen. Electronically Signed   By: Inez Catalina M.D.   On: 12/26/2020 15:23   DG Chest Port 1 View  Result Date: 12/27/2020 CLINICAL DATA:  60 year old male with cardiogenic shock. Acute renal failure. EXAM: PORTABLE CHEST 1 VIEW COMPARISON:  Portable chest 12/26/2020 and earlier. FINDINGS: Portable AP semi upright view at 0443 hours. Stable lines and tubes. Lower lung volumes. Increased patchy and indistinct right greater than left mid and lower lung opacity, much of the diaphragm now obscured. Stable mediastinal contours allowing for lower lung volumes. No pneumothorax. No acute osseous abnormality identified. IMPRESSION: 1. Stable lines and tubes. 2. Lower lung volumes with worsening bilateral lower lung ventilation, and lung opacity now largely obscures the diaphragm. Differential considerations include increasing pulmonary edema with atelectasis, increasing pneumonia, ARDS. Electronically Signed   By: Genevie Ann M.D.   On: 12/27/2020 05:03   DG Chest Port 1 View  Result Date: 12/26/2020 CLINICAL DATA:  respiratory distress that had been progressively worsening over the course of 5/7/22Now in ICU intubatedHx CHF, DM EXAM: PORTABLE CHEST 1 VIEW COMPARISON:  Three exams on 12/25/2020. FINDINGS:  Airspace opacities have decreased from the prior exams, particularly becoming less confluent in the right mid and lower lung. No new lung abnormalities. No convincing pleural effusion and no pneumothorax. Endotracheal tube, nasal/orogastric tube and right internal jugular central venous line are stable. Heart  is normal in size. IMPRESSION: 1. Improved airspace lung opacities compared to the previous day's exams, consistent with improved pulmonary edema. 2. Stable support apparatus from the most recent prior study. Electronically Signed   By: Lajean Manes M.D.   On: 12/26/2020 08:28   DG Chest Port 1 View  Result Date: 12/25/2020 CLINICAL DATA:  60 year old male with central line placement. EXAM: PORTABLE CHEST 1 VIEW COMPARISON:  Chest radiograph dated 12/25/2020. FINDINGS: Endotracheal tube above the carina in similar position. Enteric tube extends below the diaphragm with tip in the proximal stomach. There has been retraction of the Swan-Ganz catheter with tip of the catheter now over the upper SVC. Diffuse bilateral pulmonary opacities similar or slightly improved on the right. A small left pleural effusion may be present. No pneumothorax. Stable cardiomegaly. No acute osseous pathology. IMPRESSION: 1. Interval retraction of the Swan-Ganz catheter with tip now over the upper SVC. 2. Diffuse bilateral pulmonary opacities, similar or slightly improved on the right. Electronically Signed   By: Anner Crete M.D.   On: 12/25/2020 21:20   DG Chest Port 1 View  Result Date: 12/25/2020 CLINICAL DATA:  Shortness of breath, status post Swan-Ganz placement EXAM: PORTABLE CHEST 1 VIEW COMPARISON:  Film from earlier in the same day. FINDINGS: Cardiac shadow is stable. Swan-Ganz catheter is noted via the right jugular approach with the tip in the right pulmonary artery. Endotracheal tube and gastric catheter are noted in satisfactory position. Persistent bilateral airspace opacity is noted right considerably greater  than left likely representing edema. Continued follow-up is recommended. No pneumothorax is noted. IMPRESSION: No pneumothorax following Swan-Ganz placement. The remainder of the exam is stable. Electronically Signed   By: Inez Catalina M.D.   On: 12/25/2020 19:28   DG Chest Portable 1 View  Result Date: 12/25/2020 CLINICAL DATA:  Intubated. Respiratory distress. Status post cardiac arrest. EXAM: PORTABLE CHEST 1 VIEW COMPARISON:  None. FINDINGS: Enlarged cardiac silhouette. Endotracheal tube in satisfactory position. Nasogastric tube extending into stomach with the side hole visible in the proximal stomach. Extensive bilateral airspace opacity, most pronounced in the right lower lung zone. No pleural fluid. Unremarkable bones. IMPRESSION: 1. Extensive bilateral airspace opacity most pronounced in right lower lung zone. Could represent pulmonary hemorrhage, pneumonia/or alveolar edema. 2. Cardiomegaly. 3. Endotracheal tube satisfactory position Electronically Signed   By: Claudie Revering M.D.   On: 12/25/2020 15:15     Medications:   . sodium chloride    . sodium chloride    . ampicillin-sulbactam (UNASYN) IV 1.5 g (12/27/20 0831)  . doxycycline (VIBRAMYCIN) IV 100 mg (12/27/20 0832)  . fentaNYL infusion INTRAVENOUS Stopped (12/26/20 1440)  . midazolam Stopped (12/26/20 1309)  . norepinephrine (LEVOPHED) Adult infusion Stopped (12/27/20 0549)   . chlorhexidine gluconate (MEDLINE KIT)  15 mL Mouth Rinse BID  . Chlorhexidine Gluconate Cloth  6 each Topical Q0600  . docusate  100 mg Per Tube BID  . famotidine  20 mg Per Tube Daily  . fentaNYL (SUBLIMAZE) injection  50 mcg Intravenous Once  . heparin  5,000 Units Subcutaneous Q8H  . insulin aspart  0-15 Units Subcutaneous Q4H  . mouth rinse  15 mL Mouth Rinse 10 times per day  . polyethylene glycol  17 g Per Tube Daily  . sodium chloride flush  10-40 mL Intracatheter Q12H  . sodium chloride flush  3 mL Intravenous Q12H   sodium chloride,  albuterol, docusate sodium, fentaNYL, midazolam, polyethylene glycol, sodium chloride flush, sodium chloride flush  Assessment/  Plan:  Mr. Jeremy Browning is a 60 y.o. black male with diabetes mellitus type II, hypertension, congestive heart failure, who was admitted to [facility] on 12/25/2020 for Cardiogenic shock ([place]) [R57.0] Flash pulmonary edema ([place]) [J81.0] Cardiac arrest with successful resuscitation [facility]) [I46.9] Cardiopulmonary arrest with successful resuscitation ([place]) [I46.9]  1. Acute kidney injury with hyperkalemia: Proteinuria and hematuria on urinalysis.  No known baseline creatinine.  Cardiac catheterization 5/7 Nonoliguric urine output.  - no indication for dialysis.   2. Acute Respiratory Failure: requiring intubation and mechanical ventilation. Secondary to flash pulmonary edema from acute exacerbation of systolic and diastolic chronic congestive heart failure and cardiogenic shock from acute ischemic event.   3. Hypotension: secondary to cardiogenic shock and possible septic shock. No longer on vasopressors.  - empiric antibiotics: unasyn  4. Diabetes mellitus type II with renal manifestations: currently not on any outpatient agents. Hemoglobin A1c 5.9%.     LOS: 2 Jeremy Browning 5/9/20228:37 AM

## 2020-12-27 NOTE — Progress Notes (Signed)
Hypoglycemic Event  CBG: 64  Treatment: D50 25 mL (12.5 gm) @ 0735  Symptoms: None  Follow-up CBG:  Time:0756  CBG Result: 84 (capillary)  Time: 0803  CBG Result: 127 (arterial)  Possible Reasons for Event: Other: Intubated/NPO  Comments/MD notified:Dr. Belia Heman notified    Jones Broom

## 2020-12-27 NOTE — Progress Notes (Signed)
NAME:  Jeremy Browning, MRN:  [id number], DOB:  [birth year], LOS: 2 ADMISSION DATE:  12/25/2020   BRIEF SYNOPSIS  History of Present Illness:  60 yo M presenting to [facility] ED from home via EMS with respiratory distress that had been progressively worsening over the course of 12/25/20. Per documentation family stated the patient was in his normal state of health until this morning when he began feeling unwell. He then called out stating he could not breathe. EMS found the patient hypoxic and in distress with SpO2 in the 70's on room air. Patient was place on CPAP, arriving to the ED unresponsive and pulseless. ED course: Upon arrival to ED patient was defibrillated x 1 and received CPR before achieving ROSC. Initial EKG suggestive of global ischemia, second EKG had possible LBBB vs IVCD. Patient was emergently intubated requiring mechanical ventilation. Per ED documentation STEMI team activated in the setting of a witnessed shockable cardiac arrest with clinical exam highly suggestive of pulmonary edema with pink frothy sputum throughout ET tubing.   Pertinent  Medical History  Obesity Hypertension Diabetes mellitus Chronic renal insufficiency Combined systolic and diastolic heart failure  Significant Hospital Events: Including procedures, antibiotic start and stop dates in addition to other pertinent events    Cardiac catheterization on presentation with normal coronaries, good cardiac output, elevated pulmonary capillary wedge pressure.  5/7 severe resp failure 5/8 remains on  Vent 5/9 remains on vent , multiorgan failure   Interim History / Subjective:  Remains intubated Severe hypoxia Severe renal failure   CMP Latest Ref Rng & Units 12/27/2020 12/26/2020 12/25/2020  Glucose 70 - 99 mg/dL 114(H) 160(H) -  BUN 6 - 20 mg/dL 29(H) 27(H) -  Creatinine 0.61 - 1.24 mg/dL 2.35(H) 2.53(H) -  Sodium 135 - 145 mmol/L 136 135 -  Potassium 3.5 - 5.1 mmol/L 4.0 5.4(H) 5.5(H)  Chloride 98 - 111  mmol/L 111 108 -  CO2 22 - 32 mmol/L 22 18(L) -  Calcium 8.9 - 10.3 mg/dL 7.8(L) 7.7(L) -  Total Protein 6.5 - 8.1 g/dL - 7.4 -  Total Bilirubin 0.3 - 1.2 mg/dL - 0.8 -  Alkaline Phos 38 - 126 U/L - 62 -  AST 15 - 41 U/L - 73(H) -  ALT 0 - 44 U/L - 46(H) -       Micro Data:  COVID NEG INFL NEG HIV NEG HEP NEG      Objective   Blood pressure (!) 132/40, pulse 68, temperature 99.5 F (37.5 C), temperature source Bladder, resp. rate (!) 22, weight 125.8 kg, SpO2 100 %. CVP:  [8 mmHg-25 mmHg] 11 mmHg  Vent Mode: PRVC FiO2 (%):  [40 %-60 %] 40 % Set Rate:  [16 bmp-22 bmp] 16 bmp Vt Set:  [550 mL] 550 mL PEEP:  [5 cmH20-8 cmH20] 5 cmH20 Plateau Pressure:  [19 cmH20] 19 cmH20   Intake/Output Summary (Last 24 hours) at 12/27/2020 0738 Last data filed at 12/27/2020 0618 Gross per 24 hour  Intake 2260.52 ml  Output 1865 ml  Net 395.52 ml   Filed Weights   12/25/20 1824 12/26/20 0426 12/27/20 0412  Weight: 127.7 kg 127.8 kg 125.8 kg      REVIEW OF SYSTEMS  PATIENT IS UNABLE TO PROVIDE COMPLETE REVIEW OF SYSTEMS DUE TO SEVERE CRITICAL ILLNESS AND TOXIC METABOLIC ENCEPHALOPATHY   PHYSICAL EXAMINATION:  GENERAL:critically ill appearing, +resp distress HEAD: Normocephalic, atraumatic.  EYES: Pupils equal, round, reactive to light.  No scleral icterus.  MOUTH: Moist mucosal membrane.  NECK: Supple. PULMONARY: +rhonchi, +wheezing CARDIOVASCULAR: S1 and S2. Regular rate and rhythm. No murmurs, rubs, or gallops.  GASTROINTESTINAL: Soft, nontender, -distended. Positive bowel sounds.  MUSCULOSKELETAL: No swelling, clubbing, or edema.  NEUROLOGIC: obtunded SKIN:intact,warm,dry   Labs/imaging that I havepersonally reviewed  (right click and "Reselect all SmartList Selections" daily)      ASSESSMENT AND PLAN SYNOPSIS 60 yo morbidly obese AAM with acute and severe cardiac arrest NSTEMI with acute severe systolic cardiac failure with signs and symptoms of brain  damage   Severe ACUTE Hypoxic and Hypercapnic Respiratory Failure -continue Mechanical Ventilator support -continue Bronchodilator Therapy -Wean Fio2 and PEEP as tolerated -VAP/VENT bundle implementation -will perform SAT/SBT when respiratory parameters are met   CARDIAC FAILURE- NSTEMI  acute combined systolic/diastolic dysfunction -oxygen as needed -Lasix as tolerated -follow up cardiac enzymes as indicated   CARDIAC ICU monitoring   ACUTE KIDNEY INJURY/Renal Failure -continue Foley Catheter-assess need -Avoid nephrotoxic agents -Follow urine output, BMP -Ensure adequate renal perfusion, optimize oxygenation -Renal dose medications   NEUROLOGY Acute toxic metabolic encephalopathy, need for sedation Goal RASS -2 to -3 Obtain MRI brain   CARDIOGENIC SHOCK -use vasopressors to keep MAP>65 as needed   ENDO - ICU hypoglycemic\Hyperglycemia protocol -check FSBS per protocol   GI GI PROPHYLAXIS as indicated  NUTRITIONAL STATUS DIET-->TF's as tolerated Constipation protocol as indicated   ELECTROLYTES -follow labs as needed -replace as needed -pharmacy consultation and following     Best practice (right click and "Reselect all SmartList Selections" daily)  Diet:  NPO Pain/Anxiety/Delirium protocol (if indicated): Yes (RASS goal -1,-2) VAP protocol (if indicated): Yes DVT prophylaxis: Subcutaneous Heparin GI prophylaxis: H2B Glucose control:  SSI Yes Central venous access:  Yes, and it is still needed Arterial line:  Yes, and it is still needed Foley:  Yes, and it is still needed Mobility:  bed rest  PT consulted: No Code Status:  full code Disposition: ICU  Labs   CBC: Recent Labs  Lab 12/25/20 1451 12/26/20 0420 12/27/20 0407  WBC 17.7* 13.2* 8.8  NEUTROABS 5.1  --   --   HGB 14.0 12.8* 10.7*  HCT 46.6 40.7 32.8*  MCV 83.4 79.3* 77.9*  PLT 171 243 559    Basic Metabolic Panel: Recent Labs  Lab 12/25/20 1545 12/25/20 1651  12/25/20 1905 12/26/20 0420 12/27/20 0407  NA 135  --   --  135 136  K 5.5* 5.5*  --  5.4* 4.0  CL 106  --   --  108 111  CO2 23  --   --  18* 22  GLUCOSE 267*  --   --  160* 114*  BUN 21*  --   --  27* 29*  CREATININE 2.16*  --   --  2.53* 2.35*  CALCIUM 8.0*  --   --  7.7* 7.8*  MG  --   --  2.1 1.7 1.9  PHOS  --   --  7.9* 4.0 2.5   GFR: CrCl cannot be calculated (Unknown ideal weight.). Recent Labs  Lab 12/25/20 1451 12/25/20 1545 12/25/20 1843 12/26/20 0420 12/27/20 0407  PROCALCITON  --  <0.10  --  33.57 23.40  WBC 17.7*  --   --  13.2* 8.8  LATICACIDVEN 7.8*  --  1.9  --   --     Liver Function Tests: Recent Labs  Lab 12/25/20 1545 12/26/20 0420  AST 85* 73*  ALT 58* 46*  ALKPHOS 82 62  BILITOT 0.6 0.8  PROT 7.9  7.4  ALBUMIN 3.2* 3.0*   No results for input(s): LIPASE, AMYLASE in the last 168 hours. No results for input(s): AMMONIA in the last 168 hours.  ABG    Component Value Date/Time   PHART 7.40 12/27/2020 0552   PCO2ART 29 (L) 12/27/2020 0552   PO2ART 106 12/27/2020 0552   HCO3 18.0 (L) 12/27/2020 0552   ACIDBASEDEF 5.8 (H) 12/27/2020 0552   O2SAT 98.1 12/27/2020 0552     Coagulation Profile: Recent Labs  Lab 12/25/20 1451  INR 1.2    Cardiac Enzymes: No results for input(s): CKTOTAL, CKMB, CKMBINDEX, TROPONINI in the last 168 hours.  HbA1C: Hgb A1c MFr Bld  Date/Time Value Ref Range Status  12/25/2020 04:46 PM 5.9 (H) 4.8 - 5.6 % Final    Comment:    (NOTE) Pre diabetes:          5.7%-6.4%  Diabetes:              >6.4%  Glycemic control for   <7.0% adults with diabetes     CBG: Recent Labs  Lab 12/26/20 1629 12/26/20 1946 12/26/20 2334 12/27/20 0326 12/27/20 0726  GLUCAP 158* 120* 121* 107* 64*    Allergies Not on File     DVT/GI PRX  assessed I Assessed the need for Labs I Assessed the need for Foley I Assessed the need for Central Venous Line Family Discussion when available I Assessed the need for  Mobilization I made an Assessment of medications to be adjusted accordingly Safety Risk assessment completed  CASE DISCUSSED IN MULTIDISCIPLINARY ROUNDS WITH ICU TEAM     Critical Care Time devoted to patient care services described in this note is 65  minutes.  Critical care was necessary to treat or prevent imminent or life-threatening deterioration. Overall, patient is critically ill, prognosis is guarded.  Patient with Multiorgan failure and at high risk for cardiac arrest and death.    Corrin Parker, M.D.  [facility]  Medical Director [place] Director [facility] Cardio-Pulmonary Department

## 2020-12-27 NOTE — Progress Notes (Signed)
Patient noted hypertensive, tachypneic & sinus tach and remains unresponsive with increased WOB noted.. No response to sternal rub.PRN Versed 4mg  administered. 

## 2020-12-27 NOTE — Progress Notes (Signed)
This charge RN spoke with Chip Boer from [employer] regarding this patient. Patient is a registered donor. Bedside staff to call back to [facility] with any updates to the plan of care, especially with regards to end-of-life decisions.

## 2020-12-27 NOTE — Progress Notes (Signed)
Initial Nutrition Assessment  DOCUMENTATION CODES:   Obesity unspecified  INTERVENTION:   Initiate Vital HP @60ml /hr + Pro-Source TF 77ml TID via tube   Free water flushes 48ml q4 hours to maintain tube patency   Regimen provides 1848kcal/day, 141g/day protein and 1372ml/day free water   NUTRITION DIAGNOSIS:   Inadequate oral intake related to inability to eat (pt sedated and ventilated) as evidenced by NPO status.  GOAL:   Provide needs based on ASPEN/SCCM guidelines  MONITOR:   Vent status,Labs,Weight trends,TF tolerance,Skin,I & O's  REASON FOR ASSESSMENT:   Ventilator    ASSESSMENT:   60 y.o. black male with diabetes mellitus type II, hypertension and congestive heart failure who was admitted to [facility] on 12/25/2020 for cardiogenic shock and flash pulmonary edema  Pt sedated and ventilated. OGT in place. Plan is to start tube feeds today after MRI.   There is no weight history in chart to determine if any significant weight changes.   Medications reviewed and include: colace, pepcid, heparin, fentanyl, insulin, miralax, unasyn, lepophed  Labs reviewed: K 4.0 wnl, BUN 29(H), creat 2.35(H), P 2.5 wnl, Mg 1.9 wnl Hgb 10.7(L), Hct 32.8(L), MCV 77.9(L), MCH 25.4(L) cbgs- 107, 64, 84, 127, 78 x 24 hrs AIC 5.9(H)- 5/7  Patient is currently intubated on ventilator support MV: 10.9 L/min Temp (24hrs), Avg:99.5 F (37.5 C), Min:97.7 F (36.5 C), Max:100.04 F (37.8 C)  Propofol: none   MAP- >63mmHg   UOP- 77m   NUTRITION - FOCUSED PHYSICAL EXAM:  Flowsheet Row Most Recent Value  Orbital Region No depletion  Upper Arm Region No depletion  Thoracic and Lumbar Region No depletion  Buccal Region No depletion  Temple Region No depletion  Clavicle Bone Region No depletion  Clavicle and Acromion Bone Region No depletion  Scapular Bone Region No depletion  Dorsal Hand No depletion  Patellar Region No depletion  Anterior Thigh Region No depletion  Posterior  Calf Region No depletion  Edema (RD Assessment) Mild  Hair Reviewed  Eyes Reviewed  Mouth Reviewed  Skin Reviewed  Nails Reviewed     Diet Order:   Diet Order            Diet NPO time specified  Diet effective now                EDUCATION NEEDS:   No education needs have been identified at this time  Skin:  Skin Assessment: Reviewed RN Assessment  Last BM:  pta  Height:   Ht Readings from Last 1 Encounters:  No data found for Ht    Weight:   Wt Readings from Last 1 Encounters:  12/27/20 125.8 kg    BMI:  There is no height or weight on file to calculate BMI.  Estimated Nutritional Needs:   Kcal:  1384-1761kcal/day  Protein:  >125g/day  Fluid:  2.1-2.4L/day  02/26/21 MS, RD, LDN Please refer to [facility] for RD and/or RD on-call/weekend/after hours pager

## 2020-12-27 NOTE — Consult Note (Signed)
NEUROLOGY CONSULTATION NOTE   Date of service: Dec 27, 2020 Patient Name: Jeremy Browning MRN:  [id number] DOB:  Feb 09, 1961 Reason for consult: prognostication after cardiac arrest _ _ _   _ __   _ __ _ _  __ __   _ __   __ _  History of Present Illness   Patient unable to provide hx 2/2 encephalopathy therefore HPI gleaned from chart review.  60 yo gentleman with hx obesity, diabetes, hypertension, combined systolic and diastolic heart failure presented to ED on May 7 in respiratory distress c/b NSTEMI and subsequently went into cardiac arrest in ED.  He underwent emergent intubation.  In ED he also had an episode of v tach then became pulseless and cardiac code was called. He underwent defibrillation x1 and 2 rounds epi and CPR prior to achieving ROSC. Upon arrival to ED patient was defibrillated x 1 and received CPR before achieving ROSC. Initial EKG suggestive of global ischemia, second EKG had possible LBBB vs IVCD. Patient was emergently intubated requiring mechanical ventilation. Per ED documentation STEMI team activated in the setting of a witnessed shockable cardiac arrest with clinical exam highly suggestive of pulmonary edema with pink frothy sputum throughout ET tubing.  He was taken to the Cath Lab due to concern for ACS but results showed  normal coronaries, good cardiac output, elevated pulmonary capillary wedge pressure.   He remains intubated, sedated on fentanyl and versed, and on norepi for blood pressure mgmt.    ROS   UTA 2/2 encephalopathy  Past History   Past Medical History:  Diagnosis Date  . Diabetes mellitus type II, non insulin dependent (HCC)   . Essential hypertension Irving Burton like  . Heart failure (HCC)    Unknown details.  By report no cardiac surgery  . Venous stasis dermatitis of left lower extremity    Past Surgical History:  Procedure Laterality Date  . LEFT HEART CATH AND CORONARY ANGIOGRAPHY N/A 12/25/2020   Procedure: LEFT HEART CATH AND CORONARY  ANGIOGRAPHY;  Surgeon: Marykay Lex, MD;  Location: [facility] INVASIVE CV LAB;  Service: Cardiovascular;  Laterality: N/A;  . RIGHT HEART CATH N/A 12/25/2020   Procedure: RIGHT HEART CATH;  Surgeon: Marykay Lex, MD;  Location: [facility] INVASIVE CV LAB;  Service: Cardiovascular;  Laterality: N/A;  . Unknown     Family History  Family history unknown: Yes   Social History   Socioeconomic History  . Marital status: Not on file    Spouse name: Not on file  . Number of children: Not on file  . Years of education: Not on file  . Highest education level: Not on file  Occupational History  . Not on file  Tobacco Use  . Smoking status: Not on file  . Smokeless tobacco: Not on file  Substance and Sexual Activity  . Alcohol use: Not on file  . Drug use: Not on file  . Sexual activity: Not on file  Other Topics Concern  . Not on file  Social History Narrative   He lives with his brother and the brother's family.  Recently moved here to the [place] area.  Has never been established here at [facility].   Social Determinants of Health   Financial Resource Strain: Not on file  Food Insecurity: Not on file  Transportation Needs: Not on file  Physical Activity: Not on file  Stress: Not on file  Social Connections: Not on file   Not on File  Medications   No  medications prior to admission.     Vitals   Vitals:   12/27/20 1330 12/27/20 1400 12/27/20 1430 12/27/20 1500  BP:   (!) 143/56 (!) 139/47  Pulse: 91  96 97  Resp:   (!) 21 17  Temp:      TempSrc:      SpO2: 100%  100% 100%  Weight:      Height:  6' (1.829 m)       Body mass index is 37.61 kg/m.  Physical Exam   Physical Exam Gen: obtunded HEENT: Atraumatic, normocephalic Resp: CTAB, no w/r/r CV: RRR, no m/g/r Abd: soft/NT/ND Extrem: Nml bulk; no cyanosis, clubbing, or edema.  Neuro: *MS: obtunded, occasionally stirs briefly in response to noxious stimuli *Speech: intubated, no attempt to speak *CN: PERRL, (+)  corneals, oculocephalics, gag, cough *Motor & sensory: no spontaneous movement or movement to command of any extremity. No response to noxious stimuli in BUE. Withdrawal to noxious stimuli in both RLE and LLE.  *Reflexes: 2+ symmetric, toes upgoing bilat   Labs   CBC:  Recent Labs  Lab 12/25/20 1451 12/26/20 0420 12/27/20 0407  WBC 17.7* 13.2* 8.8  NEUTROABS 5.1  --   --   HGB 14.0 12.8* 10.7*  HCT 46.6 40.7 32.8*  MCV 83.4 79.3* 77.9*  PLT 171 243 155    Basic Metabolic Panel:  Lab Results  Component Value Date   NA 136 12/27/2020   K 4.0 12/27/2020   CO2 22 12/27/2020   GLUCOSE 114 (H) 12/27/2020   BUN 29 (H) 12/27/2020   CREATININE 2.35 (H) 12/27/2020   CALCIUM 7.8 (L) 12/27/2020   GFRNONAA 31 (L) 12/27/2020   Lipid Panel:  Lab Results  Component Value Date   LDLCALC 95 12/26/2020   HgbA1c:  Lab Results  Component Value Date   HGBA1C 5.9 (H) 12/25/2020   Urine Drug Screen:     Component Value Date/Time   LABOPIA POSITIVE (A) 12/25/2020 1838   COCAINSCRNUR NONE DETECTED 12/25/2020 1838   LABBENZ POSITIVE (A) 12/25/2020 1838   AMPHETMU NONE DETECTED 12/25/2020 1838   THCU NONE DETECTED 12/25/2020 1838   LABBARB NONE DETECTED 12/25/2020 1838    Alcohol Level No results found for: [facility]    MRI brain wo contrast 5/9: personal review shows: - Gyriform restricted diffusion primarily on the R cerebral cortex - Smaller focal areas of restricted diffusion incl R caudate - hyperintensity in R cerebellum is T2 shine through (chronic) - 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 Did not undergo TTM.   It is too early to render formal opinion regarding prognosis (48 hrs out from arrest, 24 hrs after continuous  sedation was turned off, but patient did receive prn versed to facilitate MRI). That said, he does have some signs on exam including brisk pupillary response, all brainstem reflexes are intact, and he withdraws bilaterally to noxious stimuli in each foot.  Recommendations   - Avoid or minimize sedation or deliriogenic medications unless medically necessary - Routine EEG tmrw - MRA H&N without contrast (R ICA or MCA stenosis may explain why gyriform restricted diffusion was only seen on R hemisphere on MRI  We will continue to follow ______________________________________________________________________   This patient is critically  ill and at significant risk of neurological worsening, death and care requires constant monitoring of vital signs, hemodynamics,respiratory and cardiac monitoring, neurological assessment, discussion with family, other specialists and medical decision making of high complexity. I spent 75 minutes of neurocritical care time  in the care of  this patient. This was time spent independent of any time provided by nurse practitioner or PA.  Bing Neighbors, MD [facility] [phone]  If 7pm- 7am, please page neurology on call as listed in AMION.   Thank you for the opportunity to take part in the care of this patient. If you have any further questions, please contact the neurology consultation attending.  Signed,  Bing Neighbors, MD [facility] [phone]  If 7pm- 7am, please page neurology on call as listed in AMION.

## 2020-12-27 NOTE — Progress Notes (Signed)
Called bedside as patient has received multiple PRN's of versed IVP for sedation without impact. Upon arrival bedside patient is tachycardic, hypertensive in the 200's, tachypneic in the 30's-40's biting on the ETT causing high pressures. Per nursing & RT bedside this has been ongoing since beginning of shift. Brief neuro assessment: patient has eyes open, staring at ceiling- does not respond to name/track with eyes, no purposeful mvmt noted.  - restarting propofol drip to maintain patient on ventilator support   Cheryll Cockayne Rust-Chester, AGACNP-BC Acute Care Nurse Practitioner [facility]   [phone] / [phone] Please see Amion for pager details.

## 2020-12-27 NOTE — Progress Notes (Signed)
*  PRELIMINARY RESULTS* Echocardiogram 2D Echocardiogram has been performed.  Cristela Blue 12/27/2020, 11:53 AM

## 2020-12-27 NOTE — Progress Notes (Signed)
PHARMACY CONSULT NOTE - FOLLOW UP  Pharmacy Consult for Electrolyte Monitoring and Replacement   Recent Labs: Potassium (mmol/L)  Date Value  12/27/2020 4.0   Magnesium (mg/dL)  Date Value  41/93/7902 1.9   Calcium (mg/dL)  Date Value  40/97/3532 7.8 (L)   Albumin (g/dL)  Date Value  99/24/2683 3.0 (L)   Phosphorus (mg/dL)  Date Value  41/96/2229 2.5   Sodium (mmol/L)  Date Value  12/27/2020 136   Corrected calcium=8.6  Assessment:  60 year old male with obesity, diabetes mellitus, hypertension, and combined diastolic and systolic heart failure who presented to the emergency department yesterday in respiratory distress admitted following witnessed cardiac arrest. Pt is S/P CPR intubation. Pt with AKI on suspected CKD insetting of shock; nephrology following. Pharmacy consulted for electrolyte monitoring and replacement.  Lasix drip started 5/7 evening and discontinued 5/8.  Goal of Therapy:  Electrolytes WNL  Plan:   Mg 1.9 - replacement ordered by NP  Will follow with AM labs and replace as needed.  Raiford Noble, PharmD Pharmacy Resident  12/27/2020 6:43 AM

## 2020-12-28 ENCOUNTER — Inpatient Hospital Stay: Payer: Medicaid Other

## 2020-12-28 DIAGNOSIS — G939 Disorder of brain, unspecified: Secondary | ICD-10-CM

## 2020-12-28 DIAGNOSIS — N170 Acute kidney failure with tubular necrosis: Secondary | ICD-10-CM | POA: Diagnosis not present

## 2020-12-28 DIAGNOSIS — I5020 Unspecified systolic (congestive) heart failure: Secondary | ICD-10-CM

## 2020-12-28 DIAGNOSIS — I469 Cardiac arrest, cause unspecified: Secondary | ICD-10-CM | POA: Diagnosis not present

## 2020-12-28 LAB — CULTURE, BLOOD (ROUTINE X 2): Special Requests: ADEQUATE

## 2020-12-28 LAB — GLUCOSE, CAPILLARY
Glucose-Capillary: 100 mg/dL — ABNORMAL HIGH (ref 70–99)
Glucose-Capillary: 110 mg/dL — ABNORMAL HIGH (ref 70–99)
Glucose-Capillary: 121 mg/dL — ABNORMAL HIGH (ref 70–99)
Glucose-Capillary: 76 mg/dL (ref 70–99)
Glucose-Capillary: 90 mg/dL (ref 70–99)
Glucose-Capillary: 91 mg/dL (ref 70–99)

## 2020-12-28 LAB — CBC
HCT: 32.2 % — ABNORMAL LOW (ref 39.0–52.0)
Hemoglobin: 10 g/dL — ABNORMAL LOW (ref 13.0–17.0)
MCH: 24.4 pg — ABNORMAL LOW (ref 26.0–34.0)
MCHC: 31.1 g/dL (ref 30.0–36.0)
MCV: 78.7 fL — ABNORMAL LOW (ref 80.0–100.0)
Platelets: 173 10*3/uL (ref 150–400)
RBC: 4.09 MIL/uL — ABNORMAL LOW (ref 4.22–5.81)
RDW: 17.2 % — ABNORMAL HIGH (ref 11.5–15.5)
WBC: 9.1 10*3/uL (ref 4.0–10.5)
nRBC: 0 % (ref 0.0–0.2)

## 2020-12-28 LAB — BLOOD GAS, ARTERIAL
Acid-base deficit: 7.3 mmol/L — ABNORMAL HIGH (ref 0.0–2.0)
Bicarbonate: 19.3 mmol/L — ABNORMAL LOW (ref 20.0–28.0)
FIO2: 1
MECHVT: 550 mL
O2 Saturation: 90 %
PEEP: 14 cmH2O
Patient temperature: 37
RATE: 28 resp/min
pCO2 arterial: 42 mmHg (ref 32.0–48.0)
pH, Arterial: 7.27 — ABNORMAL LOW (ref 7.350–7.450)
pO2, Arterial: 67 mmHg — ABNORMAL LOW (ref 83.0–108.0)

## 2020-12-28 LAB — CULTURE, RESPIRATORY W GRAM STAIN: Culture: NORMAL

## 2020-12-28 LAB — MAGNESIUM: Magnesium: 2.3 mg/dL (ref 1.7–2.4)

## 2020-12-28 LAB — PROTEIN ELECTRO, RANDOM URINE
Albumin ELP, Urine: 46.1 %
Alpha-1-Globulin, U: 6.7 %
Alpha-2-Globulin, U: 8 %
Beta Globulin, U: 22.7 %
Gamma Globulin, U: 16.5 %
Total Protein, Urine: 22.9 mg/dL

## 2020-12-28 LAB — PHOSPHORUS: Phosphorus: 3.1 mg/dL (ref 2.5–4.6)

## 2020-12-28 LAB — BASIC METABOLIC PANEL
Anion gap: 7 (ref 5–15)
BUN: 28 mg/dL — ABNORMAL HIGH (ref 6–20)
CO2: 19 mmol/L — ABNORMAL LOW (ref 22–32)
Calcium: 8.2 mg/dL — ABNORMAL LOW (ref 8.9–10.3)
Chloride: 112 mmol/L — ABNORMAL HIGH (ref 98–111)
Creatinine, Ser: 1.82 mg/dL — ABNORMAL HIGH (ref 0.61–1.24)
GFR, Estimated: 42 mL/min — ABNORMAL LOW (ref >60)
Glucose, Bld: 146 mg/dL — ABNORMAL HIGH (ref 70–99)
Potassium: 3.8 mmol/L (ref 3.5–5.1)
Sodium: 138 mmol/L (ref 135–145)

## 2020-12-28 LAB — TRIGLYCERIDES: Triglycerides: 106 mg/dL (ref <150)

## 2020-12-28 IMAGING — CT CT HEAD W/O CM
4 of 8 series · 14 of 47 positions shown, 16 images · non-contrast
Comparison: MR on [DATE]

CLINICAL DATA: Neuro deficit. Acute stroke suspected. Hypertensive.

EXAM:
CT HEAD WITHOUT CONTRAST
TECHNIQUE: Contiguous axial images were obtained from the base of the skull
through the vertex without intravenous contrast.

[Series 6: coronal soft tissue · coronal · 0.33mm/px · 3 of 74 slices shown]
[im 19/74  brain]
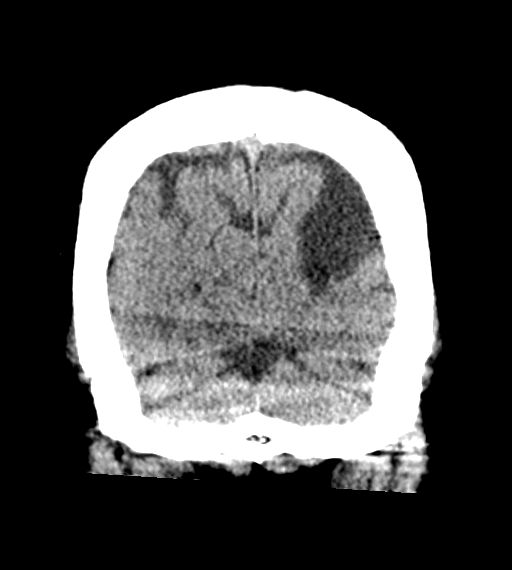
[im 37/74  brain]
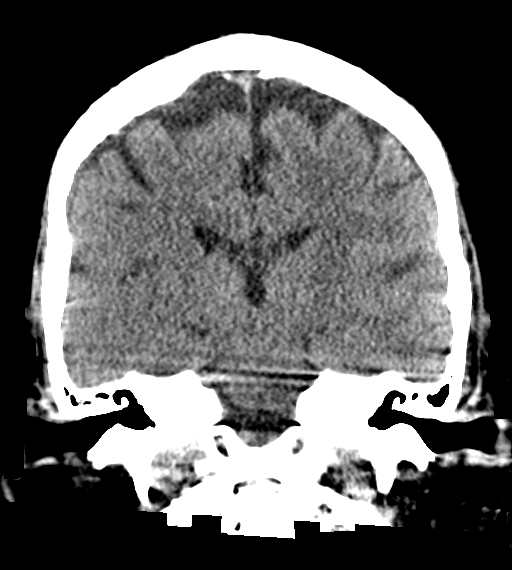
[im 55/74  brain]
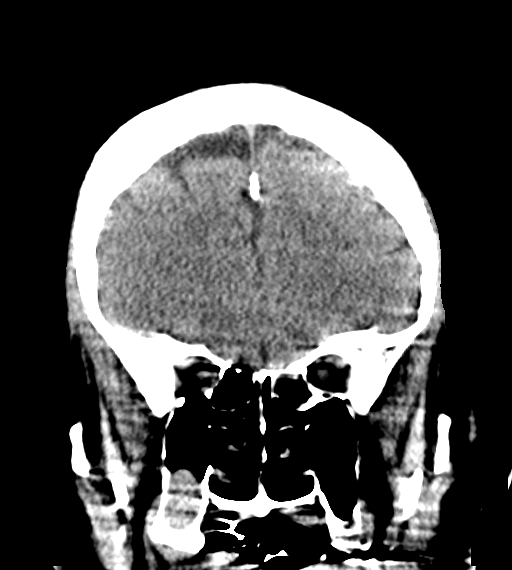

[Series 9: sagittal soft tissue · sagittal · 0.37mm/px · 2 of 56 slices shown]
[im 19/56  brain]
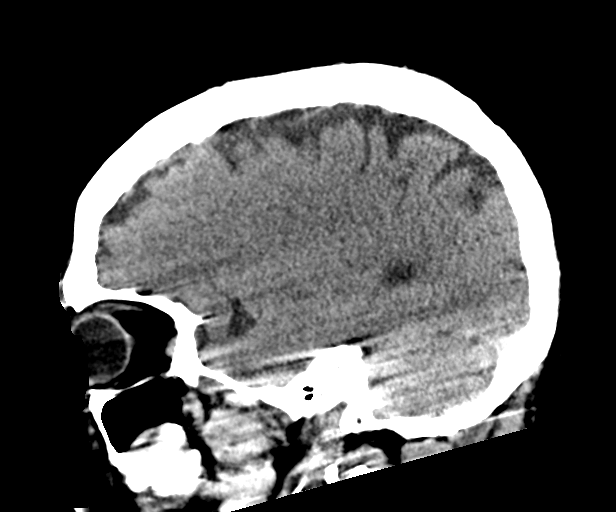
[im 37/56  brain]
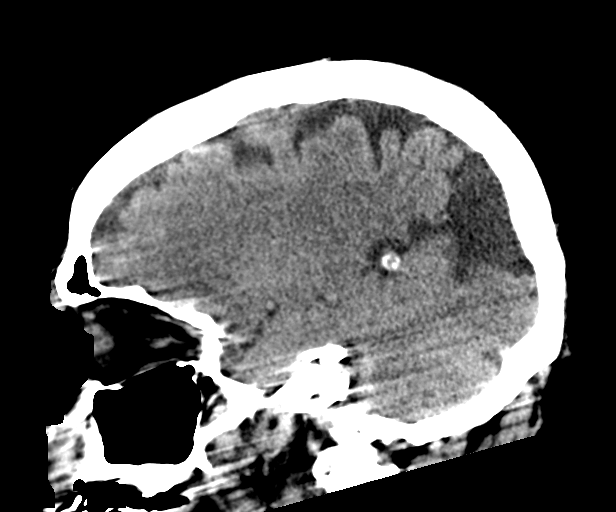

[Series 10: ax head wo · axial · 0.37mm/px · z∈[-78,+36]mm · 6 of 37 slices shown, 8 images (1 of 2)]
[im 6/37  brain]
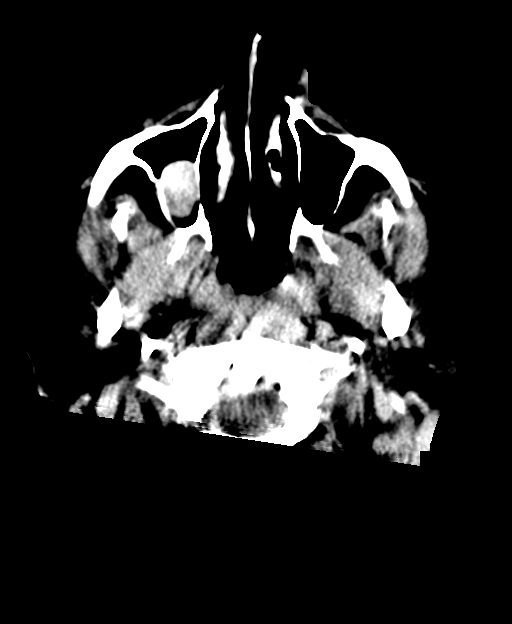
[im 6/37  bone]
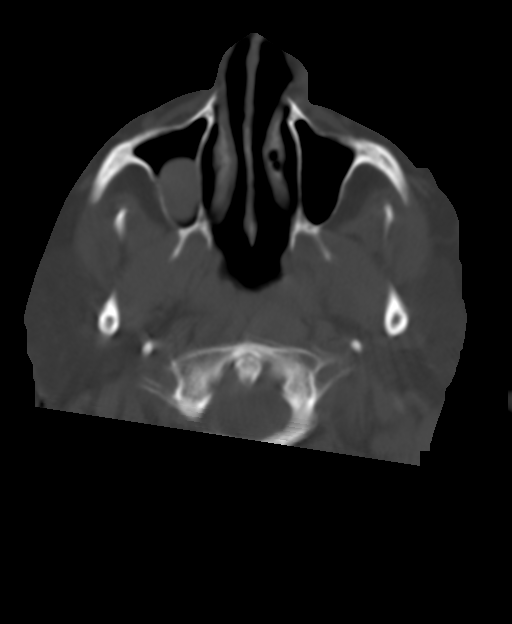
[im 11/37  brain]
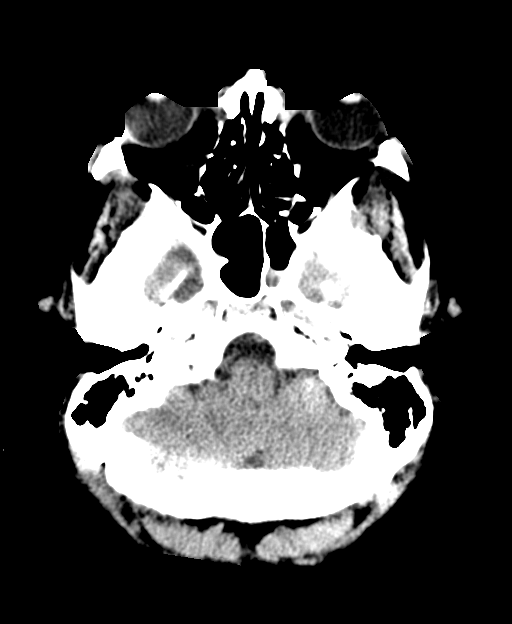
[im 16/37  brain]
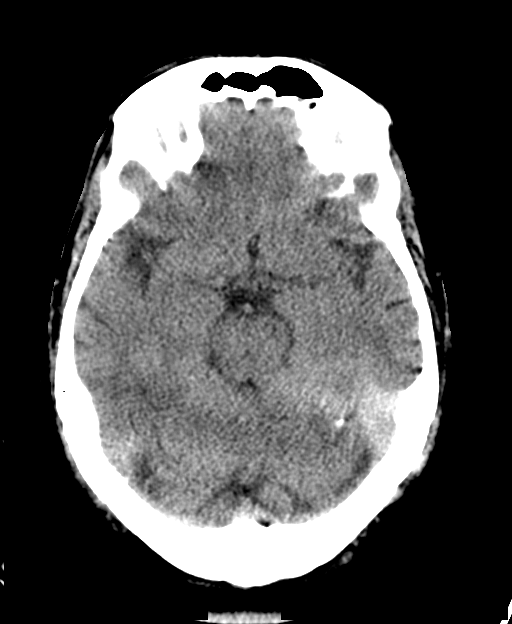
[im 21/37  brain]
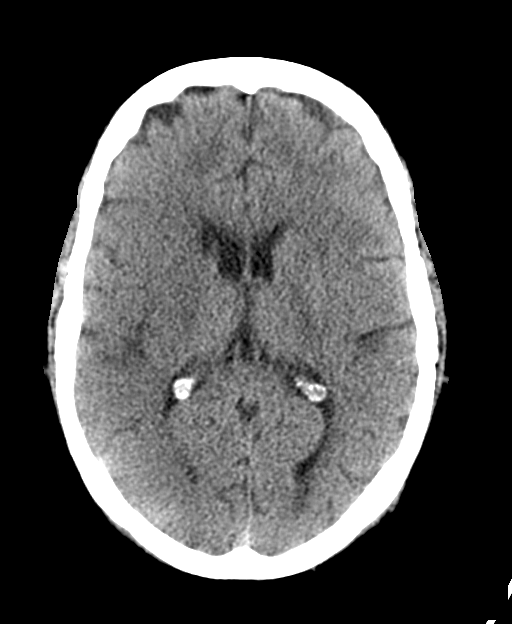
[im 26/37  brain]
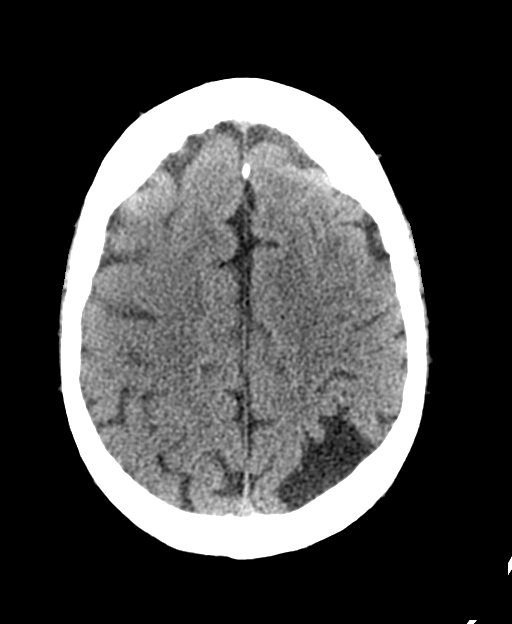
[im 26/37  bone]
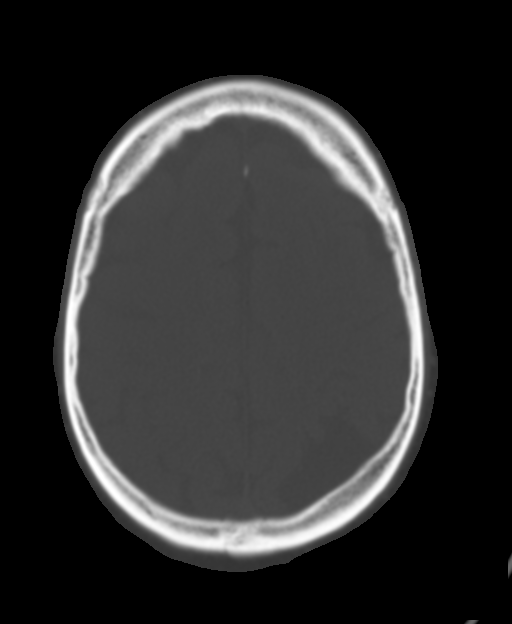
[im 31/37  brain]
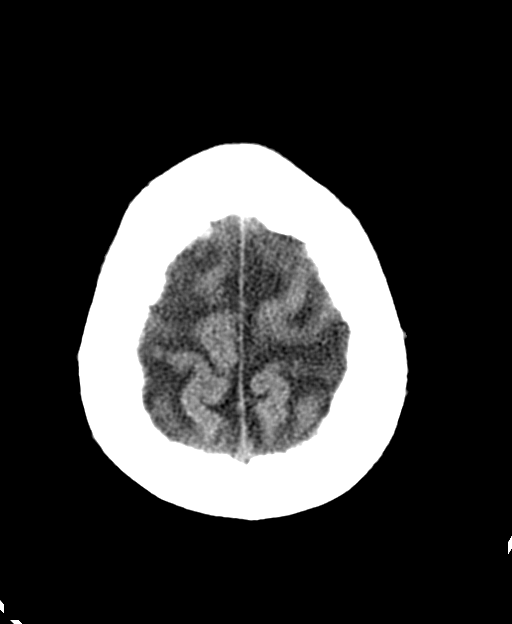

[Series 11: ax head wo · axial · 0.37mm/px · z∈[-78,-32]mm · 3 of 37 slices shown (2 of 2)]
[im 6/37  brain]
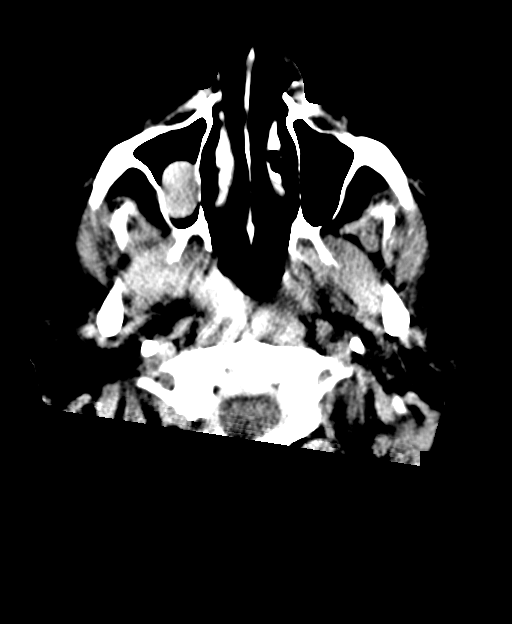
[im 11/37  brain]
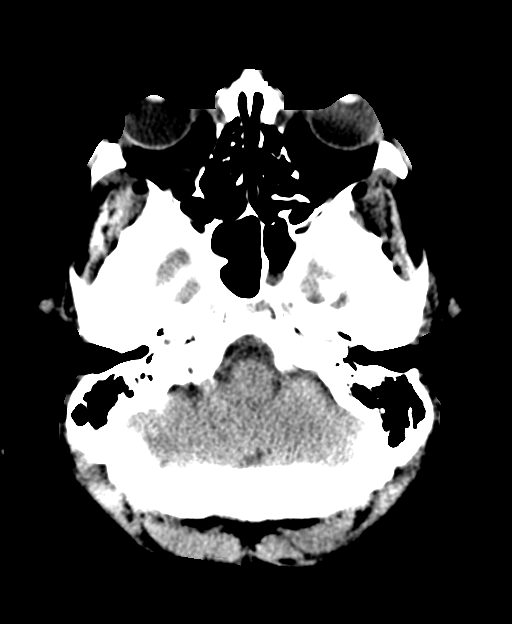
[im 16/37  brain]
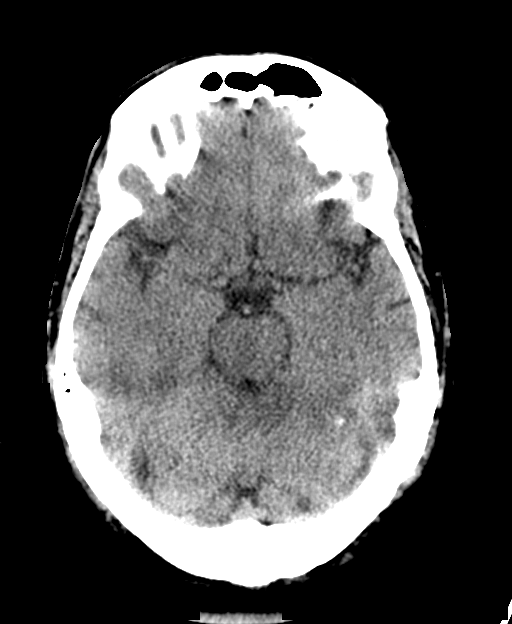

[14 of 47 positions shown; findings below may reference images not displayed]

FINDINGS: Brain: There is encephalomalacia in the LEFT parieto-occipital lobe,
consistent with remote infarct. Remote lacunar infarct identified
within the RIGHT basal ganglia. There is central and cortical
atrophy. There is no intra or extra-axial fluid collection or mass
lesion. The basilar cisterns and ventricles have a normal
appearance. There is no CT evidence for acute infarction or
hemorrhage.

Vascular: There is atherosclerotic calcification of the internal
carotid arteries. A hyperdense vessels.

Skull: Normal. Negative for fracture or focal lesion.

Sinuses/Orbits: Orbits are unremarkable. Small amounts of fluid
and/or mucosal thickening within the paranasal sinuses. Small RIGHT
mastoid effusion.

Other: Patient is intubated. Study quality is degraded by patient
motion artifact.
IMPRESSION: 1. Chronic infarcts of the LEFT parieto-occipital lobe and RIGHT
basal ganglia.
2.  No evidence for acute  abnormality.

## 2020-12-28 MED ORDER — FENTANYL CITRATE (PF) 100 MCG/2ML IJ SOLN
100.0000 ug | Freq: Once | INTRAMUSCULAR | Status: AC
  Administered 2020-12-28: 100 ug via INTRAVENOUS

## 2020-12-28 MED ORDER — VITAL HIGH PROTEIN PO LIQD
1000.0000 mL | ORAL | Status: DC
  Administered 2020-12-28: 1000 mL

## 2020-12-28 MED ORDER — ACETAMINOPHEN 160 MG/5ML PO SOLN
650.0000 mg | ORAL | Status: DC | PRN
  Administered 2020-12-28 – 2021-01-26 (×9): 650 mg
  Filled 2020-12-28 (×12): qty 20.3

## 2020-12-28 MED ORDER — SODIUM CHLORIDE 0.9 % IV SOLN
3.0000 g | Freq: Four times a day (QID) | INTRAVENOUS | Status: AC
  Administered 2020-12-28 – 2020-12-29 (×6): 3 g via INTRAVENOUS
  Filled 2020-12-28: qty 8
  Filled 2020-12-28: qty 3
  Filled 2020-12-28 (×4): qty 8

## 2020-12-28 NOTE — Progress Notes (Signed)
   12/28/20 1115  Art Line  Arterial Line BP 193/81  Arterial Line MAP (mmHg) 117 mmHg    10mg  hydralazine given per PRN order

## 2020-12-28 NOTE — Progress Notes (Signed)
NAME:  Jeremy Browning, MRN:  [id number], DOB:  1961/07/14, LOS: 3 ADMISSION DATE:  12/25/2020  History of Present Illness:  60 yo M presenting to [facility] ED from home via EMS with respiratory distress that had been progressively worsening over the course of 12/25/20. Per documentation family stated the patient was in his normal state of health until this morning when he began feeling unwell. He then called out stating he could not breathe. EMS found the patient hypoxic and in distress with SpO2 in the 70's on room air. Patient was place on CPAP, arriving to the ED unresponsive and pulseless. ED course: Upon arrival to ED patient was defibrillated x 1 and received CPR before achieving ROSC. Initial EKG suggestive of global ischemia, second EKG had possible LBBB vs IVCD. Patient was emergently intubated requiring mechanical ventilation. Per ED documentation STEMI team activated in the setting of a witnessed shockable cardiac arrest with clinical exam highly suggestive of pulmonary edema with pink frothy sputum throughout ET tubing.  Pertinent Medical History  Obesity Hypertension Diabetes mellitus Chronic renal insufficiency Combined systolic and diastolic heart failure  Significant Hospital Events: Including procedures, antibiotic start and stop dates in addition to other pertinent events   Cardiac catheterization on presentation with normal coronaries, good cardiac output, elevated pulmonary capillary wedge pressure.  5/7 severe resp failure 5/8 remains on  Vent 5/9 remains on vent , multiorgan failure 5/10 remains on vent, multiorgan faiure       Interim History / Subjective:  Remains intubated Severe HTN last night Started on propofol Signs of Severe brain damage  Prognosis seems very poor         Objective   Blood pressure (!) 141/51, pulse 99, temperature 99.9 F (37.7 C), temperature source Axillary, resp. rate (!) 27, height 6' (1.829 m), weight 125.6 kg, SpO2 100  %. CVP:  [10 mmHg-16 mmHg] 10 mmHg  Vent Mode: PRVC FiO2 (%):  [28 %] 28 % Set Rate:  [16 bmp] 16 bmp Vt Set:  [550 mL] 550 mL PEEP:  [5 cmH20] 5 cmH20 Plateau Pressure:  [20 cmH20] 20 cmH20   Intake/Output Summary (Last 24 hours) at 12/28/2020 0723 Last data filed at 12/28/2020 0500 Gross per 24 hour  Intake 752.66 ml  Output 1700 ml  Net -947.34 ml   Filed Weights   12/26/20 0426 12/27/20 0412 12/28/20 0431  Weight: 127.8 kg 125.8 kg 125.6 kg      REVIEW OF SYSTEMS  PATIENT IS UNABLE TO PROVIDE COMPLETE REVIEW OF SYSTEMS DUE TO SEVERE CRITICAL ILLNESS AND TOXIC METABOLIC ENCEPHALOPATHY   PHYSICAL EXAMINATION:  GENERAL:critically ill appearing, +resp distress HEAD: Normocephalic, atraumatic.  EYES: Pupils equal, round, reactive to light.  No scleral icterus.  MOUTH: Moist mucosal membrane. NECK: Supple. PULMONARY: +rhonchi, +wheezing CARDIOVASCULAR: S1 and S2. Regular rate and rhythm. No murmurs, rubs, or gallops.  GASTROINTESTINAL: Soft, nontender, -distended. Positive bowel sounds.  MUSCULOSKELETAL: No swelling, clubbing, or edema.  NEUROLOGIC: obtunded SKIN:intact,warm,dry   Labs/imaging that I have personally reviewed  (right click and "Reselect all SmartList Selections" daily)       ASSESSMENT AND PLAN SYNOPSIS 60 yo morbidly obese AAM with acute and severe cardiac arrest NSTEMI with acute severe systolic cardiac failure with signs and symptoms of brain damage   Severe ACUTE Hypoxic and Hypercapnic Respiratory Failure -continue Mechanical Ventilator support -continue Bronchodilator Therapy -Wean Fio2 and PEEP as tolerated -VAP/VENT bundle implementation -will NOT perform SAT/SBT severe resp failure and severe encephalopathy  CARDIAC FAILURE-acute combined systolic/diastolic  dysfunction -oxygen as needed -Lasix as tolerated -follow up cardiac enzymes as indicated   CARDIAC ICU monitoring   ACUTE KIDNEY INJURY/Renal Failure -continue Foley  Catheter-assess need -Avoid nephrotoxic agents -Follow urine output, BMP -Ensure adequate renal perfusion, optimize oxygenation -Renal dose medications   NEUROLOGY Acute toxic metabolic encephalopathy, need for sedation abnormal MRI Signs of brain damage fro HIE Follow up NEURO RECS Goal RASS -2 to -3    ENDO - ICU hypoglycemic\Hyperglycemia protocol -check FSBS per protocol   GI GI PROPHYLAXIS as indicated  NUTRITIONAL STATUS DIET-->TF's as tolerated Constipation protocol as indicated   ELECTROLYTES -follow labs as needed -replace as needed -pharmacy consultation and following     Best practice (right click and "Reselect all SmartList Selections" daily)  Diet:NPO Pain/Anxiety/Delirium protocol (if indicated):Yes(RASS goal -1,-2) VAP protocol (if indicated):Yes DVT prophylaxis:Subcutaneous Heparin GI prophylaxis:H2B Glucose control:SSIYes Central venous access:Yes, and it is still needed Arterial line:Yes, and it is still needed Foley:Yes, and it is still needed Mobility:bed rest PT consulted:No Code Status:full code Disposition:ICU    Labs   CBC: Recent Labs  Lab 12/25/20 1451 12/26/20 0420 12/27/20 0407 12/28/20 0429  WBC 17.7* 13.2* 8.8 9.1  NEUTROABS 5.1  --   --   --   HGB 14.0 12.8* 10.7* 10.0*  HCT 46.6 40.7 32.8* 32.2*  MCV 83.4 79.3* 77.9* 78.7*  PLT 171 243 155 173    Basic Metabolic Panel: Recent Labs  Lab 12/25/20 1545 12/25/20 1651 12/25/20 1905 12/26/20 0420 12/27/20 0407 12/28/20 0429  NA 135  --   --  135 136 138  K 5.5* 5.5*  --  5.4* 4.0 3.8  CL 106  --   --  108 111 112*  CO2 23  --   --  18* 22 19*  GLUCOSE 267*  --   --  160* 114* 146*  BUN 21*  --   --  27* 29* 28*  CREATININE 2.16*  --   --  2.53* 2.35* 1.82*  CALCIUM 8.0*  --   --  7.7* 7.8* 8.2*  MG  --   --  2.1 1.7 1.9 2.3  PHOS  --   --  7.9* 4.0 2.5 3.1   GFR: Estimated Creatinine Clearance: 59.1 mL/min (A) (by C-G formula  based on SCr of 1.82 mg/dL (H)). Recent Labs  Lab 12/25/20 1451 12/25/20 1545 12/25/20 1843 12/26/20 0420 12/27/20 0407 12/28/20 0429  PROCALCITON  --  <0.10  --  33.57 23.40  --   WBC 17.7*  --   --  13.2* 8.8 9.1  LATICACIDVEN 7.8*  --  1.9  --   --   --     Liver Function Tests: Recent Labs  Lab 12/25/20 1545 12/26/20 0420  AST 85* 73*  ALT 58* 46*  ALKPHOS 82 62  BILITOT 0.6 0.8  PROT 7.9 7.4  ALBUMIN 3.2* 3.0*   No results for input(s): LIPASE, AMYLASE in the last 168 hours. No results for input(s): AMMONIA in the last 168 hours.  ABG    Component Value Date/Time   PHART 7.33 (L) 12/27/2020 1032   PCO2ART 39 12/27/2020 1032   PO2ART 65 (L) 12/27/2020 1032   HCO3 20.6 12/27/2020 1032   ACIDBASEDEF 4.9 (H) 12/27/2020 1032   O2SAT 90.8 12/27/2020 1032     Coagulation Profile: Recent Labs  Lab 12/25/20 1451  INR 1.2    Cardiac Enzymes: No results for input(s): CKTOTAL, CKMB, CKMBINDEX, TROPONINI in the last 168 hours.  HbA1C: Hgb  A1c MFr Bld  Date/Time Value Ref Range Status  12/25/2020 04:46 PM 5.9 (H) 4.8 - 5.6 % Final    Comment:    (NOTE) Pre diabetes:          5.7%-6.4%  Diabetes:              >6.4%  Glycemic control for   <7.0% adults with diabetes     CBG: Recent Labs  Lab 12/27/20 1109 12/27/20 1533 12/27/20 1914 12/27/20 2315 12/28/20 0317  GLUCAP 78 86 92 109* 76    Allergies Not on File     DVT/GI PRX  assessed I Assessed the need for Labs I Assessed the need for Foley I Assessed the need for Central Venous Line Family Discussion when available I Assessed the need for Mobilization I made an Assessment of medications to be adjusted accordingly Safety Risk assessment completed  CASE DISCUSSED IN MULTIDISCIPLINARY ROUNDS WITH ICU TEAM     Critical Care Time devoted to patient care services described in this note is 50 minutes.  Critical care was necessary to treat or prevent imminent or life-threatening  deterioration. Overall, patient is critically ill, prognosis is guarded.  Patient with Multiorgan failure and at high risk for cardiac arrest and death.    Lucie Leather, M.D.  [facility]  Medical Director [facility] Medical Director [facility] Cardio-Pulmonary Department

## 2020-12-28 NOTE — Progress Notes (Addendum)
GOALS OF CARE DISCUSSION  The Clinical status was relayed to family in detail. Izell Klam[place] the Brother updated  Updated and notified of patients medical condition.    Patient remains unresponsive and will not open eyes to command.   Patient is having a weak cough and struggling to remove secretions.   Patient with increased WOB and using accessory muscles to breathe Explained to family course of therapy and the modalities    Patient with Progressive multiorgan failure with a very high probablity of a very minimal chance of meaningful recovery despite all aggressive and optimal medical therapy.  Patient is suffering and in the dying process  PATIENT REMAINS FULL CODE  Family understands the situation.  Signs of severe brain damage +multiorgan failure   Family are satisfied with Plan of action and management. All questions answered  Additional CC time 35 mins   Tiarna Koppen Santiago Glad, M.D.  [facility]  Medical Director [facility] Medical Director [facility] Cardio-Pulmonary Department

## 2020-12-28 NOTE — Progress Notes (Signed)
PHARMACY NOTE:  ANTIMICROBIAL RENAL DOSAGE ADJUSTMENT  Current antimicrobial regimen includes a mismatch between antimicrobial dosage and estimated renal function.  As per policy approved by the Pharmacy & Therapeutics and Medical Executive Committees, the antimicrobial dosage will be adjusted accordingly.  Current antimicrobial dosage:  Ampicillin/sulbactam 1.5gm IV q6h  Indication: PNA  Renal Function:  Estimated Creatinine Clearance: 59.1 mL/min (A) (by C-G formula based on SCr of 1.82 mg/dL (H)). []      On intermittent HD, scheduled: []      On CRRT    Antimicrobial dosage has been changed to:  Ampicillin/sulbactam 3g IV q6h  Additional comments:   Thank you for allowing pharmacy to be a part of this patient's care.  , PharmD, BCPS.   Work Cell: [phone] 12/28/2020 9:08 AM

## 2020-12-28 NOTE — Progress Notes (Signed)
Nutrition Follow-up  DOCUMENTATION CODES:   Obesity unspecified  INTERVENTION:   Vital High Protein @ 50 ml/hr via OGT (1200 ml/ day)  45 ml Prosource TF TID    Free water flushes 10ml q4 hours to maintain tube patency   Tube feeding regimen provides 1320 kcal (95% of needs), 138 grams of protein, and 1003 ml of H2O. Total free water: 1183 ml daily.   TF + propofol to provide 1719 kcals daily  NUTRITION DIAGNOSIS:   Inadequate oral intake related to inability to eat (pt sedated and ventilated) as evidenced by NPO status.  Ongoing  GOAL:   Provide needs based on ASPEN/SCCM guidelines  Progressing   MONITOR:   Vent status,Labs,Weight trends,TF tolerance,Skin,I & O's  REASON FOR ASSESSMENT:   Ventilator    ASSESSMENT:   60 y.o. black male with diabetes mellitus type II, hypertension and congestive heart failure who was admitted to [facility] on 12/25/2020 for cardiogenic shock and flash pulmonary edema  Patient is currently intubated on ventilator support MV: 13.3 L/min Temp (24hrs), Avg:99.2 F (37.3 C), Min:97 F (36.1 C), Max:100.8 F (38.2 C)  Propofol: 15.1 ml/hr (provides 399 kcals daily)  Reviewed I/O's: -947 ml x 24 hours and +677 ml since admission  UOP: 1.7 L x 24 hours  MAP: 79  Case discussed in ICU rounds. Pt with severe HTN and was started on propofol last night. Pt with signs of brain damage and a STAT CT of head has been ordered. Pt with poor prognosis per MD.   Medications reviewed and include colace and miralax.   Lab Results  Component Value Date   HGBA1C 5.9 (H) 12/25/2020   PTA DM medications are .   Labs reviewed: CBGS: 76-109 (inpatient orders for glycemic control are 0-15 units insulin aspart every 4 hours).   Diet Order:   Diet Order            Diet NPO time specified  Diet effective now                 EDUCATION NEEDS:   No education needs have been identified at this time  Skin:  Skin Assessment: Reviewed RN  Assessment  Last BM:  Unknown  Height:   Ht Readings from Last 1 Encounters:  12/27/20 6' (1.829 m)    Weight:   Wt Readings from Last 1 Encounters:  12/28/20 125.6 kg   BMI:  Body mass index is 37.55 kg/m.  Estimated Nutritional Needs:   Kcal:  1384-1761kcal/day  Protein:  >125g/day  Fluid:  2.1-2.4L/day    Levada Schilling, RD, LDN, CDCES Registered Dietitian II Certified Diabetes Care and Education Specialist Please refer to [facility] for RD and/or RD on-call/weekend/after hours pager

## 2020-12-28 NOTE — Progress Notes (Signed)
PHARMACY CONSULT NOTE - FOLLOW UP  Pharmacy Consult for Electrolyte Monitoring and Replacement   Recent Labs: Potassium (mmol/L)  Date Value  12/28/2020 3.8   Magnesium (mg/dL)  Date Value  72/53/6644 2.3   Calcium (mg/dL)  Date Value  03/47/4259 8.2 (L)   Albumin (g/dL)  Date Value  56/38/7564 3.0 (L)   Phosphorus (mg/dL)  Date Value  33/29/5188 3.1   Sodium (mmol/L)  Date Value  12/28/2020 138   Corrected calcium=8.6  Assessment:  60 year old male with obesity, diabetes mellitus, hypertension, and combined diastolic and systolic heart failure who presented to the emergency department yesterday in respiratory distress admitted following witnessed cardiac arrest. Pt is S/P CPR intubation. Pt with AKI on suspected CKD insetting of shock; nephrology following. Pharmacy consulted for electrolyte monitoring and replacement.  Lasix drip started 5/7 evening and discontinued 5/8.  Goal of Therapy:  Electrolytes WNL  Plan:   No replacement indicated at this time  Will follow with AM labs and replace as needed.  Raiford Noble, PharmD Pharmacy Resident  12/28/2020 6:45 AM

## 2020-12-28 NOTE — Progress Notes (Signed)
Pupils appear unequal on visual exam this morning, left larger than right, both pupils brisk. Pupils were equal on previous exams.  Dr. Belia Heman aware.

## 2020-12-28 NOTE — Procedures (Signed)
Routine EEG Report  Jeremy Browning is a 60 y.o. male with a history of cardiac arrest who is undergoing an EEG to evaluate for seizures.  Report: This EEG was acquired with electrodes placed according to the International 10-20 electrode system (including Fp1, Fp2, F3, F4, C3, C4, P3, P4, O1, O2, T3, T4, T5, T6, A1, A2, Fz, Cz, Pz). The following electrodes were missing or displaced: none.  The occipital dominant rhythm was 3-5 Hz with intermittent focal slowing on the R and overriding faster frequencies in beta range throughout the recording. Background activity was nearly continuous with rare intervening periods of suppression lasting 1-2 sec. This activity was reactive to stimulation. No sleep architecture was identified. There were no interictal epileptiform discharges. There were no electrographic seizures identified. Photic stimulation and hyperventilation was not performed. Significant muscle artifact in the R>L FT leads secondary to patient clenching jaw and biting ET tube did not interfere with interpretation of the recording.  Impression and clinical correlation: This EEG was obtained while sedated and is abnormal due to moderate diffuse slowing and focal slowing over the right hemisphere indicating both global and R focal cerebral dysfunction. No electrographic seizures were identified.  Bing Neighbors, MD [facility] [phone]  If 7pm- 7am, please page neurology on call as listed in AMION.

## 2020-12-28 NOTE — Progress Notes (Signed)
eeg done °

## 2020-12-28 NOTE — Progress Notes (Signed)
650mg  tylenol given for fever 101.7

## 2020-12-28 NOTE — Progress Notes (Signed)
[facility]  ROUNDING NOTE   Subjective:   UOP 1743m.   Creatinine 1.82 (2.35) (2.53)  Placed on propofol.   No purposeful movements and not responding to commands or stimuli.   Objective:  Vital signs in last 24 hours:  Temp:  [97 F (36.1 C)-100.5 F (38.1 C)] 99.9 F (37.7 C) (05/10 0437) Pulse Rate:  [66-123] 99 (05/10 0500) Resp:  [15-32] 27 (05/10 0500) BP: (97-195)/(37-83) 141/51 (05/10 0500) SpO2:  [98 %-100 %] 100 % (05/10 0500) Arterial Line BP: (95-223)/(47-103) 163/67 (05/10 0500) FiO2 (%):  [28 %] 28 % (05/10 0341) Weight:  [125.6 kg] 125.6 kg (05/10 0431)  Weight change: -0.2 kg Filed Weights   12/26/20 0426 12/27/20 0412 12/28/20 0431  Weight: 127.8 kg 125.8 kg 125.6 kg    Intake/Output: I/O last 3 completed shifts: In: 1488.4 [I.V.:376.4; NG/GT:12; IV Piggyback:1100] Out: 2875 [Urine:2525; Emesis/NG output:350]   Intake/Output this shift:  No intake/output data recorded.  Physical Exam: General: Critically ill  Head: ETT   Eyes: Eyes closed  Neck: trachea midline  Lungs:  PRVC FiO2 28%  Heart: Regular rate and rhythm  Abdomen:  Soft   Extremities:  + peripheral edema.  Neurologic: No neurologic response, off sedation  Skin: No lesions  Access: Left femoral vascath 5/8    Basic Metabolic Panel: Recent Labs  Lab 12/25/20 1545 12/25/20 1651 12/25/20 1905 12/26/20 0420 12/27/20 0407 12/28/20 0429  NA 135  --   --  135 136 138  K 5.5* 5.5*  --  5.4* 4.0 3.8  CL 106  --   --  108 111 112*  CO2 23  --   --  18* 22 19*  GLUCOSE 267*  --   --  160* 114* 146*  BUN 21*  --   --  27* 29* 28*  CREATININE 2.16*  --   --  2.53* 2.35* 1.82*  CALCIUM 8.0*  --   --  7.7* 7.8* 8.2*  MG  --   --  2.1 1.7 1.9 2.3  PHOS  --   --  7.9* 4.0 2.5 3.1    Liver Function Tests: Recent Labs  Lab 12/25/20 1545 12/26/20 0420  AST 85* 73*  ALT 58* 46*  ALKPHOS 82 62  BILITOT 0.6 0.8  PROT 7.9 7.4  ALBUMIN 3.2* 3.0*   No results for  input(s): LIPASE, AMYLASE in the last 168 hours. No results for input(s): AMMONIA in the last 168 hours.  CBC: Recent Labs  Lab 12/25/20 1451 12/26/20 0420 12/27/20 0407 12/28/20 0429  WBC 17.7* 13.2* 8.8 9.1  NEUTROABS 5.1  --   --   --   HGB 14.0 12.8* 10.7* 10.0*  HCT 46.6 40.7 32.8* 32.2*  MCV 83.4 79.3* 77.9* 78.7*  PLT 171 243 155 173    Cardiac Enzymes: No results for input(s): CKTOTAL, CKMB, CKMBINDEX, TROPONINI in the last 168 hours.  BNP: Invalid input(s): POCBNP  CBG: Recent Labs  Lab 12/27/20 1533 12/27/20 1914 12/27/20 2315 12/28/20 0317 12/28/20 0732  GLUCAP 86 92 109* [id number]    Microbiology: Results for orders placed or performed during the hospital encounter of 12/25/20  Culture, blood (routine x 2)     Status: Abnormal   Collection Time: 12/25/20  2:55 PM   Specimen: BLOOD  Result Value Ref Range Status   Specimen Description   Final    BLOOD BLOOD RIGHT HAND [facility]formed [facility] [address], [place] [address]  Special Requests   Final    BOTTLES DRAWN AEROBIC AND ANAEROBIC Blood Culture adequate volume Performed at [facility], [address] [place], [place] [address]    Culture  Setup Time   Final    GRAM POSITIVE COCCI AEROBIC BOTTLE ONLY CRITICAL RESULT CALLED TO, READ BACK BY AND VERIFIED WITH: MORGAN CUNNIGHAN AT 6045 ON 12/26/20 BY SS    Culture (A)  Final    STAPHYLOCOCCUS HOMINIS THE SIGNIFICANCE OF ISOLATING THIS ORGANISM FROM A SINGLE SET OF BLOOD CULTURES WHEN MULTIPLE SETS ARE DRAWN IS UNCERTAIN. PLEASE NOTIFY THE MICROBIOLOGY DEPARTMENT WITHIN ONE WEEK IF SPECIATION AND SENSITIVITIES ARE REQUIRED. Performed at [facility], [place] [address] [place], [place] [address]    Report Status 12/28/2020 FINAL  Final  Blood Culture ID Panel (Reflexed)     Status: Abnormal   Collection Time: 12/25/20  2:55 PM  Result Value Ref Range Status   Enterococcus faecalis NOT DETECTED NOT DETECTED  Final   Enterococcus Faecium NOT DETECTED NOT DETECTED Final   Listeria monocytogenes NOT DETECTED NOT DETECTED Final   Staphylococcus species DETECTED (A) NOT DETECTED Final    Comment: CRITICAL RESULT CALLED TO, READ BACK BY AND VERIFIED WITH: MORGAN CUNNINGHAN AT 1159 ON 12/26/20 BY SS    Staphylococcus aureus (BCID) NOT DETECTED NOT DETECTED Final   Staphylococcus epidermidis NOT DETECTED NOT DETECTED Final   Staphylococcus lugdunensis NOT DETECTED NOT DETECTED Final   Streptococcus species NOT DETECTED NOT DETECTED Final   Streptococcus agalactiae NOT DETECTED NOT DETECTED Final   Streptococcus pneumoniae NOT DETECTED NOT DETECTED Final   Streptococcus pyogenes NOT DETECTED NOT DETECTED Final   A.calcoaceticus-baumannii NOT DETECTED NOT DETECTED Final   Bacteroides fragilis NOT DETECTED NOT DETECTED Final   Enterobacterales NOT DETECTED NOT DETECTED Final   Enterobacter cloacae complex NOT DETECTED NOT DETECTED Final   Escherichia coli NOT DETECTED NOT DETECTED Final   Klebsiella aerogenes NOT DETECTED NOT DETECTED Final   Klebsiella oxytoca NOT DETECTED NOT DETECTED Final   Klebsiella pneumoniae NOT DETECTED NOT DETECTED Final   Proteus species NOT DETECTED NOT DETECTED Final   Salmonella species NOT DETECTED NOT DETECTED Final   Serratia marcescens NOT DETECTED NOT DETECTED Final   Haemophilus influenzae NOT DETECTED NOT DETECTED Final   Neisseria meningitidis NOT DETECTED NOT DETECTED Final   Pseudomonas aeruginosa NOT DETECTED NOT DETECTED Final   Stenotrophomonas maltophilia NOT DETECTED NOT DETECTED Final   Candida albicans NOT DETECTED NOT DETECTED Final   Candida auris NOT DETECTED NOT DETECTED Final   Candida glabrata NOT DETECTED NOT DETECTED Final   Candida krusei NOT DETECTED NOT DETECTED Final   Candida parapsilosis NOT DETECTED NOT DETECTED Final   Candida tropicalis NOT DETECTED NOT DETECTED Final   Cryptococcus neoformans/gattii NOT DETECTED NOT DETECTED Final     Comment: Performed at [facility], [address] [place], [place] [address]  Resp Panel by RT-PCR (Flu A&B, Covid) Nasopharyngeal Swab     Status: None   Collection Time: 12/25/20  5:51 PM   Specimen: Nasopharyngeal Swab; Nasopharyngeal(NP) swabs in vial transport medium  Result Value Ref Range Status   SARS Coronavirus 2 by RT PCR NEGATIVE NEGATIVE Final    Comment: (NOTE) SARS-CoV-2 target nucleic acids are NOT DETECTED.  The SARS-CoV-2 RNA is generally detectable in upper respiratory specimens during the acute phase of infection. The lowest concentration of SARS-CoV-2 viral copies this assay can detect is 138 copies/mL. A negative result does not preclude SARS-Cov-2  infection and should not be used as the sole basis for treatment or other patient management decisions. A negative result may occur with  improper specimen collection/handling, submission of specimen other than nasopharyngeal swab, presence of viral mutation(s) within the areas targeted by this assay, and inadequate number of viral copies(<138 copies/mL). A negative result must be combined with clinical observations, patient history, and epidemiological information. The expected result is Negative.  Fact Sheet for Patients:  EntrepreneurPulse.com.au  Fact Sheet for Healthcare Providers:  IncredibleEmployment.be  This test is no t yet approved or cleared by the [place] FDA and  has been authorized for detection and/or diagnosis of SARS-CoV-2 by FDA under an Emergency Use Authorization (EUA). This EUA will remain  in effect (meaning this test can be used) for the duration of the COVID-19 declaration under Section 564(b)(1) of the Act, 21 U.S.C.section 360bbb-3(b)(1), unless the authorization is terminated  or revoked sooner.       Influenza A by PCR NEGATIVE NEGATIVE Final   Influenza B by PCR NEGATIVE NEGATIVE Final    Comment: (NOTE) The Xpert Xpress  SARS-CoV-2/FLU/RSV plus assay is intended as an aid in the diagnosis of influenza from Nasopharyngeal swab specimens and should not be used as a sole basis for treatment. Nasal washings and aspirates are unacceptable for Xpert Xpress SARS-CoV-2/FLU/RSV testing.  Fact Sheet for Patients: EntrepreneurPulse.com.au  Fact Sheet for Healthcare Providers: IncredibleEmployment.be  This test is not yet approved or cleared by the [place] FDA and has been authorized for detection and/or diagnosis of SARS-CoV-2 by FDA under an Emergency Use Authorization (EUA). This EUA will remain in effect (meaning this test can be used) for the duration of the COVID-19 declaration under Section 564(b)(1) of the Act, 21 U.S.C. section 360bbb-3(b)(1), unless the authorization is terminated or revoked.  Performed at [facility], [address] [place], [place] [address]   MRSA PCR Screening     Status: None   Collection Time: 12/25/20  6:39 PM   Specimen: Nasopharyngeal  Result Value Ref Range Status   MRSA by PCR NEGATIVE NEGATIVE Final    Comment:        The GeneXpert MRSA Assay (FDA approved for NASAL specimens only), is one component of a comprehensive MRSA colonization surveillance program. It is not intended to diagnose MRSA infection nor to guide or monitor treatment for MRSA infections. Performed at [facility], [address] [place], [place] [address]   Culture, blood (routine x 2)     Status: None (Preliminary result)   Collection Time: 12/25/20  6:43 PM   Specimen: BLOOD  Result Value Ref Range Status   Specimen Description BLOOD Blood Culture adequate volume  Final   Special Requests   Final    BOTTLES DRAWN AEROBIC AND ANAEROBIC LEFT ANTECUBITAL   Culture   Final    NO GROWTH 2 DAYS Performed at [facility], [address] [place], [place] [address]    Report Status PENDING  Incomplete  Culture,  Respiratory w Gram Stain     Status: None (Preliminary result)   Collection Time: 12/26/20  6:27 AM   Specimen: Tracheal Aspirate; Respiratory  Result Value Ref Range Status   Specimen Description   Final    TRACHEAL ASPIRATE Performed at [facility], [address] [place], [place] [address]    Special Requests   Final    NONE Performed at [facility], [address] [place],  [address]    Gram  Stain   Final    FEW WBC PRESENT,BOTH PMN AND MONONUCLEAR RARE GRAM POSITIVE COCCI IN PAIRS    Culture   Final    CULTURE REINCUBATED FOR BETTER GROWTH Performed at [facility], [place] [address], [place], [place] [address]    Report Status PENDING  Incomplete    Coagulation Studies: Recent Labs    12/25/20 1451  LABPROT 14.7  INR 1.2    Urinalysis: Recent Labs    12/25/20 1839  COLORURINE YELLOW*  LABSPEC 1.017  PHURINE 5.0  GLUCOSEU NEGATIVE  HGBUR LARGE*  BILIRUBINUR NEGATIVE  KETONESUR NEGATIVE  PROTEINUR 100*  NITRITE NEGATIVE  LEUKOCYTESUR NEGATIVE      Imaging: MR BRAIN WO CONTRAST  Result Date: 12/27/2020 CLINICAL DATA:  Anoxic brain damage.  Cardiac arrest. EXAM: MRI HEAD WITHOUT CONTRAST TECHNIQUE: Multiplanar, multiecho pulse sequences of the brain and surrounding structures were obtained without intravenous contrast. COMPARISON:  None. FINDINGS: Brain: There are subcentimeter acute infarcts in the right cerebellum, mesial right temporal lobe, right temporal stem, right basal ganglia, and right frontoparietal subcortical white matter. Additionally, there is mildly restricted diffusion diffusely involving cortex laterally over the right cerebral convexity (right frontal, parietal, and temporal lobes as well as insula in the MCA territory). There is slight associated cortical T2 FLAIR hyperintensity mainly in the right parietal lobe without significant gyral swelling. There is a chronic infarct in the right basal ganglia with  associated chronic blood products. There is also a moderate-sized chronic left parieto-occipital infarct. Scattered small T2 hyperintensities elsewhere in the cerebral white matter bilaterally are nonspecific but compatible with mild chronic small vessel ischemic disease. The ventricles are normal in size aside from slight ex vacuo dilatation of the body/frontal horn of the right lateral ventricle and occipital horn of the left lateral ventricle related to the chronic infarcts. No mass, midline shift, or extra-axial fluid collection is identified. Vascular: Major intracranial vascular flow voids are preserved. Skull and upper cervical spine: Unremarkable bone marrow signal. Sinuses/Orbits: Unremarkable orbits. Mucous retention cyst or polyp in the right maxillary sinus. Mild scattered mucosal thickening in the paranasal sinuses elsewhere. Small bilateral mastoid effusions. Other: None. IMPRESSION: 1. Small acute infarcts in the right cerebellum and right cerebral hemisphere. 2. Mildly restricted diffusion involving the cortex more diffusely over the right cerebral convexity favored to reflect an acute MCA territory infarct over global hypoxic injury given unilaterality. 3. 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 Electronically Signed   By: Logan Bores M.D.   On: 12/27/2020 15:27   US RENAL  Result Date: 12/26/2020 CLINICAL DATA:  Acute renal failure EXAM: RENAL / URINARY TRACT ULTRASOUND COMPLETE COMPARISON:  None. FINDINGS: Right Kidney: Renal measurements: 9.2 x 4.8 x 7.0 cm. = volume: 159 mL. 3.4 cm simple cyst is noted in the upper pole centrally. No mass lesion or hydronephrosis is noted. Mild increased echogenicity is noted. Left Kidney: Renal measurements: 10.4 x 5.7 x 4.6 cm. = volume: 143 mL. 2.1 cm mid polar renal cyst is noted simple in nature. Mild increased echogenicity is noted. Bladder: Decompressed secondary to Foley catheter. Other: None. IMPRESSION: Mild increased  echogenicity is noted bilaterally. Bilateral simple cysts are seen. Electronically Signed   By: Inez Catalina M.D.   On: 12/26/2020 15:23   DG Chest Port 1 View  Result Date: 12/27/2020 CLINICAL DATA:  60 year old male with cardiogenic shock. Acute renal failure. EXAM: PORTABLE CHEST 1 VIEW COMPARISON:  Portable chest 12/26/2020 and earlier. FINDINGS: Portable AP semi  upright view at 0443 hours. Stable lines and tubes. Lower lung volumes. Increased patchy and indistinct right greater than left mid and lower lung opacity, much of the diaphragm now obscured. Stable mediastinal contours allowing for lower lung volumes. No pneumothorax. No acute osseous abnormality identified. IMPRESSION: 1. Stable lines and tubes. 2. Lower lung volumes with worsening bilateral lower lung ventilation, and lung opacity now largely obscures the diaphragm. Differential considerations include increasing pulmonary edema with atelectasis, increasing pneumonia, ARDS. Electronically Signed   By: Genevie Ann M.D.   On: 12/27/2020 05:03   ECHOCARDIOGRAM COMPLETE  Result Date: 12/27/2020    ECHOCARDIOGRAM REPORT   Patient Name:   JAMONTAE THWAITES Date of Exam: 12/27/2020 Medical Rec #:  [id number]      Height: Accession #:    [id number]     Weight:       277.3 lb Date of Birth:  December 18, 1960       BSA:          2.511 m Patient Age:    60 years       BP:           179/82 mmHg Patient Gender: M              HR:           66 bpm. 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                  Risk Factors:Hypertension and Diabetes. Heart failure.  Sonographer:     Sherrie Sport RDCS (AE) Referring Phys:  [id number] BRITTON L [place] Diagnosing Phys: Ida Rogue MD IMPRESSIONS  1. Left ventricular ejection fraction, by  estimation, is 45 to 50%. The left ventricle has mildly decreased function. The left ventricle demonstrates global hypokinesis. The left ventricular internal cavity size was mildly to moderately dilated. There is mild left ventricular hypertrophy. Left ventricular diastolic parameters are consistent with Grade I diastolic dysfunction (impaired relaxation).  2. Right ventricular systolic function is normal. The right ventricular size is normal.  3. Left atrial size was mildly dilated. FINDINGS  Left Ventricle: Left ventricular ejection fraction, by estimation, is 45 to 50%. The left ventricle has mildly decreased function. The left ventricle demonstrates global hypokinesis. The left ventricular internal cavity size was mildly to moderately dilated. There is mild left ventricular hypertrophy. Left ventricular diastolic parameters are consistent with Grade I diastolic dysfunction (impaired relaxation). Right Ventricle: The right ventricular size is normal. No increase in right ventricular wall thickness. Right ventricular systolic function is normal. Left Atrium: Left atrial size was mildly dilated. Right Atrium: Right atrial size was normal in size. Pericardium: There is no evidence of pericardial effusion. Mitral Valve: The mitral valve is normal in structure. No evidence of mitral valve regurgitation. No evidence of mitral valve stenosis. Tricuspid Valve: The tricuspid valve is normal in structure. Tricuspid valve regurgitation is not demonstrated. No evidence of tricuspid stenosis. Aortic Valve: The aortic valve was not well visualized. Aortic valve regurgitation is mild. No aortic stenosis is present. Aortic valve mean gradient  measures 5.3 mmHg. Aortic valve peak gradient measures 10.5 mmHg. Aortic valve area, by VTI measures 1.91 cm. Pulmonic Valve: The pulmonic valve was normal in structure. Pulmonic valve regurgitation is not visualized. No evidence of pulmonic stenosis. Aorta: The aortic root is normal in  size and structure. Venous: The inferior vena cava is normal in size with greater than 50% respiratory variability, suggesting right atrial pressure of 3 mmHg. IAS/Shunts: No atrial level shunt detected by color flow Doppler.  LEFT VENTRICLE PLAX 2D LVIDd:         6.41 cm  Diastology LVIDs:         4.61 cm  LV e' medial:    5.44 cm/s LV PW:         1.34 cm  LV E/e' medial:  18.9 LV IVS:        0.96 cm  LV e' lateral:   7.83 cm/s LVOT diam:     2.00 cm  LV E/e' lateral: 13.2 LV SV:         56 LV SV Index:   22 LVOT Area:     3.14 cm  RIGHT VENTRICLE RV Basal diam:  3.93 cm RV S prime:     16.40 cm/s TAPSE (M-mode): 4.1 cm LEFT ATRIUM           Index       RIGHT ATRIUM           Index LA diam:      4.80 cm 1.91 cm/m  RA Area:     20.40 cm LA Vol (A2C): 92.3 ml 36.76 ml/m RA Volume:   54.00 ml  21.50 ml/m LA Vol (A4C): 69.3 ml 27.60 ml/m  AORTIC VALVE                    PULMONIC VALVE AV Area (Vmax):    1.83 cm     PV Vmax:        1.31 m/s AV Area (Vmean):   1.80 cm     PV Peak grad:   6.9 mmHg AV Area (VTI):     1.91 cm     RVOT Peak grad: 8 mmHg AV Vmax:           162.33 cm/s AV Vmean:          105.000 cm/s AV VTI:            0.294 m AV Peak Grad:      10.5 mmHg AV Mean Grad:      5.3 mmHg LVOT Vmax:         94.60 cm/s LVOT Vmean:        60.300 cm/s LVOT VTI:          0.179 m LVOT/AV VTI ratio: 0.61  AORTA Ao Root diam: 3.07 cm MITRAL VALVE                TRICUSPID VALVE MV Area (PHT): 3.17 cm     TR Peak grad:   18.0 mmHg MV Decel Time: 239 msec     TR Vmax:        212.00 cm/s MV E velocity: 103.00 cm/s MV A velocity: 117.00 cm/s  SHUNTS MV E/A ratio:  0.88         Systemic VTI:  0.18 m                             Systemic Diam: 2.00 cm  Ida Rogue MD Electronically signed by Ida Rogue MD Signature Date/Time: 12/27/2020/3:00:49 PM    Final      Medications:   . sodium chloride    . sodium chloride    . ampicillin-sulbactam (UNASYN) IV 1.5 g (12/28/20 0751)  . feeding supplement (VITAL AF 1.2  CAL) 60 mL/hr at 12/28/20 0500  . propofol (DIPRIVAN) infusion 20 mcg/kg/min (12/28/20 0500)   . chlorhexidine gluconate (MEDLINE KIT)  15 mL Mouth Rinse BID  . Chlorhexidine Gluconate Cloth  6 each Topical Q0600  . docusate  100 mg Per Tube BID  . famotidine  20 mg Per Tube Daily  . feeding supplement (PROSource TF)  45 mL Per Tube TID  . fentaNYL (SUBLIMAZE) injection  50 mcg Intravenous Once  . free water  30 mL Per Tube Q4H  . heparin  5,000 Units Subcutaneous Q8H  . hydrALAZINE  25 mg Per Tube Q6H  . insulin aspart  0-15 Units Subcutaneous Q4H  . isosorbide dinitrate  10 mg Per Tube Q6H  . mouth rinse  15 mL Mouth Rinse 10 times per day  . polyethylene glycol  17 g Per Tube Daily  . sodium chloride flush  10-40 mL Intracatheter Q12H  . sodium chloride flush  3 mL Intravenous Q12H   sodium chloride, albuterol, docusate, fentaNYL, fentaNYL (SUBLIMAZE) injection, hydrALAZINE, midazolam, midazolam, polyethylene glycol, sodium chloride flush, sodium chloride flush  Assessment/ Plan:  Mr. Jeremy Browning is a 60 y.o. black male with diabetes mellitus type II, hypertension, congestive heart failure, who was admitted to [facility] on 12/25/2020 for Cardiogenic shock ([place]) [R57.0] Flash pulmonary edema ([place]) [J81.0] Cardiac arrest with successful resuscitation [facility]) [I46.9] Cardiopulmonary arrest with successful resuscitation ([place]) [I46.9]  1. Acute kidney injury with hyperkalemia: Proteinuria and hematuria on urinalysis.  Creatinine continues to improve.  Cardiac catheterization 5/7 Nonoliguric urine output.  No indication for dialysis.   2. Acute Respiratory Failure: requiring intubation and mechanical ventilation. Secondary to flash pulmonary edema from acute exacerbation of systolic and diastolic chronic congestive heart failure and cardiogenic shock from acute ischemic event.   3. Cardiogenic shock and possible septic shock. No longer on vasopressors.  - empiric antibiotics:  unasyn  4. Hypertension:  - hydralazine and isosorbide mononitrate  4. Diabetes mellitus type II with renal manifestations: currently not on any outpatient agents. Hemoglobin A1c 5.9%.     LOS: 3 Kylee Umana 5/10/20229:00 AM

## 2020-12-28 NOTE — Progress Notes (Signed)
   12/28/20 1730  Vitals  BP (!) 91/44  MAP (mmHg) (!) 56  Pulse Rate 90  ECG Heart Rate 89  Resp (!) 26  Oxygen Therapy  SpO2 99 %  Art Line  Arterial Line BP 99/47  Arterial Line MAP (mmHg) 64 mmHg    Noted drop in BP, decreased propofol to 66mcg/kg/min

## 2020-12-28 NOTE — Progress Notes (Signed)
Pt continues to be tachypneic in 30s. fentanyl given per Dr. Belia Heman.

## 2020-12-28 NOTE — Progress Notes (Signed)
Progress Note  Patient Name: Jeremy Browning Date of Encounter: 12/28/2020  [facility] Cardiologist: None   Subjective   Intubated, sedated.  Inpatient Medications    Scheduled Meds: . chlorhexidine gluconate (MEDLINE KIT)  15 mL Mouth Rinse BID  . Chlorhexidine Gluconate Cloth  6 each Topical Q0600  . docusate  100 mg Per Tube BID  . famotidine  20 mg Per Tube Daily  . feeding supplement (PROSource TF)  45 mL Per Tube TID  . fentaNYL (SUBLIMAZE) injection  50 mcg Intravenous Once  . free water  30 mL Per Tube Q4H  . heparin  5,000 Units Subcutaneous Q8H  . hydrALAZINE  25 mg Per Tube Q6H  . insulin aspart  0-15 Units Subcutaneous Q4H  . isosorbide dinitrate  10 mg Per Tube Q6H  . mouth rinse  15 mL Mouth Rinse 10 times per day  . polyethylene glycol  17 g Per Tube Daily  . sodium chloride flush  10-40 mL Intracatheter Q12H  . sodium chloride flush  3 mL Intravenous Q12H   Continuous Infusions: . sodium chloride    . sodium chloride    . ampicillin-sulbactam (UNASYN) IV    . feeding supplement (VITAL HIGH PROTEIN)    . propofol (DIPRIVAN) infusion 30 mcg/kg/min (12/28/20 1200)   PRN Meds: sodium chloride, albuterol, docusate, fentaNYL, fentaNYL (SUBLIMAZE) injection, hydrALAZINE, midazolam, midazolam, polyethylene glycol, sodium chloride flush, sodium chloride flush   Vital Signs    Vitals:   12/28/20 1200 12/28/20 1215 12/28/20 1230 12/28/20 1245  BP: (!) 177/54     Pulse: (!) 35 (!) 101 (!) 101 99  Resp: (!) 35 (!) 28 (!) 28 (!) 26  Temp:      TempSrc:      SpO2: 99% 100% 100% 100%  Weight:      Height:        Intake/Output Summary (Last 24 hours) at 12/28/2020 1301 Last data filed at 12/28/2020 1200 Gross per 24 hour  Intake 2099.48 ml  Output 1450 ml  Net 649.48 ml   Last 3 Weights 12/28/2020 12/27/2020 12/26/2020  Weight (lbs) 276 lb 14.4 oz 277 lb 5.4 oz 281 lb 12 oz  Weight (kg) 125.6 kg 125.8 kg 127.8 kg      Telemetry    Sinus rhythm, bundle  branch block- Personally Reviewed  ECG    No new EKG obtained- Personally Reviewed  Physical Exam   GEN:  Intubated, sedated Neck: No JVD Cardiac: RRR, no murmurs, Respiratory:  Vented breath sounds GI: Soft, nontender, distended  MS: No edema; No deformity. Neuro:   Unable to assess Psych: Unable to assess  Labs    High Sensitivity Troponin:   Recent Labs  Lab 12/25/20 1451 12/25/20 1651  TROPONINIHS 310* 2,479*      Chemistry Recent Labs  Lab 12/25/20 1545 12/25/20 1651 12/26/20 0420 12/27/20 0407 12/28/20 0429  NA 135  --  135 136 138  K 5.5*   < > 5.4* 4.0 3.8  CL 106  --  108 111 112*  CO2 23  --  18* 22 19*  GLUCOSE 267*  --  160* 114* 146*  BUN 21*  --  27* 29* 28*  CREATININE 2.16*  --  2.53* 2.35* 1.82*  CALCIUM 8.0*  --  7.7* 7.8* 8.2*  PROT 7.9  --  7.4  --   --   ALBUMIN 3.2*  --  3.0*  --   --   AST 85*  --  73*  --   --   ALT 58*  --  46*  --   --   ALKPHOS 82  --  62  --   --   BILITOT 0.6  --  0.8  --   --   GFRNONAA 34*  --  28* 31* 42*  ANIONGAP 6  --  9 3* 7   < > = values in this interval not displayed.     Hematology Recent Labs  Lab 12/26/20 0420 12/27/20 0407 12/28/20 0429  WBC 13.2* 8.8 9.1  RBC 5.13 4.21* 4.09*  HGB 12.8* 10.7* 10.0*  HCT 40.7 32.8* 32.2*  MCV 79.3* 77.9* 78.7*  MCH 25.0* 25.4* 24.4*  MCHC 31.4 32.6 31.1  RDW 16.8* 17.0* 17.2*  PLT 243 155 173    BNP Recent Labs  Lab 12/25/20 1451 12/26/20 0420  BNP 274.6* 687.3*     DDimer No results for input(s): DDIMER in the last 168 hours.   Radiology    CT HEAD WO CONTRAST  Result Date: 12/28/2020 CLINICAL DATA:  Neuro deficit. Acute stroke suspected. Hypertensive. EXAM: CT HEAD WITHOUT CONTRAST TECHNIQUE: Contiguous axial images were obtained from the base of the skull through the vertex without intravenous contrast. COMPARISON:  MR on 12/27/2020 FINDINGS: Brain: There is encephalomalacia in the LEFT parieto-occipital lobe, consistent with remote  infarct. Remote lacunar infarct identified within the RIGHT basal ganglia. There is central and cortical atrophy. There is no intra or extra-axial fluid collection or mass lesion. The basilar cisterns and ventricles have a normal appearance. There is no CT evidence for acute infarction or hemorrhage. Vascular: There is atherosclerotic calcification of the internal carotid arteries. A hyperdense vessels. Skull: Normal. Negative for fracture or focal lesion. Sinuses/Orbits: Orbits are unremarkable. Small amounts of fluid and/or mucosal thickening within the paranasal sinuses. Small RIGHT mastoid effusion. Other: Patient is intubated. Study quality is degraded by patient motion artifact. IMPRESSION: 1. 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 2.  No evidence for acute  abnormality. Electronically Signed   By: Nolon Nations M.D.   On: 12/28/2020 12:07   MR BRAIN WO CONTRAST  Result Date: 12/27/2020 CLINICAL DATA:  Anoxic brain damage.  Cardiac arrest. EXAM: MRI HEAD WITHOUT CONTRAST TECHNIQUE: Multiplanar, multiecho pulse sequences of the brain and surrounding structures were obtained without intravenous contrast. COMPARISON:  None. FINDINGS: Brain: There are subcentimeter acute infarcts in the right cerebellum, mesial right temporal lobe, right temporal stem, right basal ganglia, and right frontoparietal subcortical white matter. Additionally, there is mildly restricted diffusion diffusely involving cortex laterally over the right cerebral convexity (right frontal, parietal, and temporal lobes as well as insula in the MCA territory). There is slight associated cortical T2 FLAIR hyperintensity mainly in the right parietal lobe without significant gyral swelling. There is a chronic infarct in the right basal ganglia with associated chronic blood products. There is also a moderate-sized chronic left parieto-occipital infarct. Scattered small T2 hyperintensities elsewhere in the  cerebral white matter bilaterally are nonspecific but compatible with mild chronic small vessel ischemic disease. The ventricles are normal in size aside from slight ex vacuo dilatation of the body/frontal horn of the right lateral ventricle and occipital horn of the left lateral ventricle related to the chronic infarcts. No mass, midline shift, or extra-axial fluid collection is identified. Vascular: Major intracranial vascular flow voids are preserved. Skull and upper cervical spine: Unremarkable bone marrow signal. Sinuses/Orbits: Unremarkable orbits. Mucous retention cyst or polyp in the right  maxillary sinus. Mild scattered mucosal thickening in the paranasal sinuses elsewhere. Small bilateral mastoid effusions. Other: None. IMPRESSION: 1. Small acute infarcts in the right cerebellum and right cerebral hemisphere. 2. Mildly restricted diffusion involving the cortex more diffusely over the right cerebral convexity favored to reflect an acute MCA territory infarct over global hypoxic injury given unilaterality. 3. 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 Electronically Signed   By: Logan Bores M.D.   On: 12/27/2020 15:27   US RENAL  Result Date: 12/26/2020 CLINICAL DATA:  Acute renal failure EXAM: RENAL / URINARY TRACT ULTRASOUND COMPLETE COMPARISON:  None. FINDINGS: Right Kidney: Renal measurements: 9.2 x 4.8 x 7.0 cm. = volume: 159 mL. 3.4 cm simple cyst is noted in the upper pole centrally. No mass lesion or hydronephrosis is noted. Mild increased echogenicity is noted. Left Kidney: Renal measurements: 10.4 x 5.7 x 4.6 cm. = volume: 143 mL. 2.1 cm mid polar renal cyst is noted simple in nature. Mild increased echogenicity is noted. Bladder: Decompressed secondary to Foley catheter. Other: None. IMPRESSION: Mild increased echogenicity is noted bilaterally. Bilateral simple cysts are seen. Electronically Signed   By: Inez Catalina M.D.   On: 12/26/2020 15:23   DG Chest Port 1  View  Result Date: 12/27/2020 CLINICAL DATA:  60 year old male with cardiogenic shock. Acute renal failure. EXAM: PORTABLE CHEST 1 VIEW COMPARISON:  Portable chest 12/26/2020 and earlier. FINDINGS: Portable AP semi upright view at 0443 hours. Stable lines and tubes. Lower lung volumes. Increased patchy and indistinct right greater than left mid and lower lung opacity, much of the diaphragm now obscured. Stable mediastinal contours allowing for lower lung volumes. No pneumothorax. No acute osseous abnormality identified. IMPRESSION: 1. Stable lines and tubes. 2. Lower lung volumes with worsening bilateral lower lung ventilation, and lung opacity now largely obscures the diaphragm. Differential considerations include increasing pulmonary edema with atelectasis, increasing pneumonia, ARDS. Electronically Signed   By: Genevie Ann M.D.   On: 12/27/2020 05:03   ECHOCARDIOGRAM COMPLETE  Result Date: 12/27/2020    ECHOCARDIOGRAM REPORT   Patient Name:   Jeremy Browning Date of Exam: 12/27/2020 Medical Rec #:  [id number]      Height: Accession #:    [id number]     Weight:       277.3 lb Date of Birth:  1961/01/22       BSA:          2.511 m Patient Age:    60 years       BP:           179/82 mmHg Patient Gender: M              HR:           66 bpm. 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                  Risk Factors:Hypertension and Diabetes. Heart failure.  Sonographer:     Sherrie Sport RDCS (AE) Referring Phys:  [id number] [place] L [place] Diagnosing Phys: Ida Rogue MD  IMPRESSIONS  1. Left ventricular ejection fraction, by estimation, is 45 to 50%. The left ventricle has mildly decreased function. The left ventricle demonstrates global hypokinesis. The left ventricular internal  cavity size was mildly to moderately dilated. There is mild left ventricular hypertrophy. Left ventricular diastolic parameters are consistent with Grade I diastolic dysfunction (impaired relaxation).  2. Right ventricular systolic function is normal. The right ventricular size is normal.  3. Left atrial size was mildly dilated. FINDINGS  Left Ventricle: Left ventricular ejection fraction, by estimation, is 45 to 50%. The left ventricle has mildly decreased function. The left ventricle demonstrates global hypokinesis. The left ventricular internal cavity size was mildly to moderately dilated. There is mild left ventricular hypertrophy. Left ventricular diastolic parameters are consistent with Grade I diastolic dysfunction (impaired relaxation). Right Ventricle: The right ventricular size is normal. No increase in right ventricular wall thickness. Right ventricular systolic function is normal. Left Atrium: Left atrial size was mildly dilated. Right Atrium: Right atrial size was normal in size. Pericardium: There is no evidence of pericardial effusion. Mitral Valve: The mitral valve is normal in structure. No evidence of mitral valve regurgitation. No evidence of mitral valve stenosis. Tricuspid Valve: The tricuspid valve is normal in structure. Tricuspid valve regurgitation is not demonstrated. No evidence of tricuspid stenosis. Aortic Valve: The aortic valve was not well visualized. Aortic valve regurgitation is mild. No aortic stenosis is present. Aortic valve mean gradient measures 5.3 mmHg. Aortic valve peak gradient measures 10.5 mmHg. Aortic valve area, by VTI measures 1.91 cm. Pulmonic Valve: The pulmonic valve was normal in structure. Pulmonic valve regurgitation is not visualized. No evidence of pulmonic stenosis. Aorta: The aortic root is normal in size and structure. Venous: The inferior vena cava is normal in size with greater than 50% respiratory variability, suggesting right atrial pressure of 3 mmHg.  IAS/Shunts: No atrial level shunt detected by color flow Doppler.  LEFT VENTRICLE PLAX 2D LVIDd:         6.41 cm  Diastology LVIDs:         4.61 cm  LV e' medial:    5.44 cm/s LV PW:         1.34 cm  LV E/e' medial:  18.9 LV IVS:        0.96 cm  LV e' lateral:   7.83 cm/s LVOT diam:     2.00 cm  LV E/e' lateral: 13.2 LV SV:         56 LV SV Index:   22 LVOT Area:     3.14 cm  RIGHT VENTRICLE RV Basal diam:  3.93 cm RV S prime:     16.40 cm/s TAPSE (M-mode): 4.1 cm LEFT ATRIUM           Index       RIGHT ATRIUM           Index LA diam:      4.80 cm 1.91 cm/m  RA Area:     20.40 cm LA Vol (A2C): 92.3 ml 36.76 ml/m RA Volume:   54.00 ml  21.50 ml/m LA Vol (A4C): 69.3 ml 27.60 ml/m  AORTIC VALVE                    PULMONIC VALVE AV Area (Vmax):    1.83 cm     PV Vmax:        1.31 m/s AV Area (Vmean):   1.80 cm     PV Peak grad:  6.9 mmHg AV Area (VTI):     1.91 cm     RVOT Peak grad: 8 mmHg AV Vmax:           162.33 cm/s AV Vmean:          105.000 cm/s AV VTI:            0.294 m AV Peak Grad:      10.5 mmHg AV Mean Grad:      5.3 mmHg LVOT Vmax:         94.60 cm/s LVOT Vmean:        60.300 cm/s LVOT VTI:          0.179 m LVOT/AV VTI ratio: 0.61  AORTA Ao Root diam: 3.07 cm MITRAL VALVE                TRICUSPID VALVE MV Area (PHT): 3.17 cm     TR Peak grad:   18.0 mmHg MV Decel Time: 239 msec     TR Vmax:        212.00 cm/s MV E velocity: 103.00 cm/s MV A velocity: 117.00 cm/s  SHUNTS MV E/A ratio:  0.88         Systemic VTI:  0.18 m                             Systemic Diam: 2.00 cm Ida Rogue MD Electronically signed by Ida Rogue MD Signature Date/Time: 12/27/2020/3:00:49 PM    Final     Cardiac Studies   Echo 12/27/2020 1. Left ventricular ejection fraction, by estimation, is 45 to 50%. The  left ventricle has mildly decreased function. The left ventricle  demonstrates global hypokinesis. The left ventricular internal cavity size  was mildly to moderately dilated. There  is mild left  ventricular hypertrophy. Left ventricular diastolic  parameters are consistent with Grade I diastolic dysfunction (impaired  relaxation).  2. Right ventricular systolic function is normal. The right ventricular  size is normal.  3. Left atrial size was mildly dilated.   [facility] 12/25/2020 POST-OPERATIVE DIAGNOSIS:  Angiographically normal coronary arteries with no obvious culprit lesion to explain heart failure this level of hypoxia or hypercapnia.  Acute combined systolic and diastolic heart failure: EF of roughly 40 to 45% with global hypokinesis: PCWP and LVEDP of 30 mmHg. ? Cardiac Output and Index relatively normal: CO 6.54, CI 2.82 (mildly reduced). ? AoP-MAP 97/59 mmHg - 71 mmHg; LVP-EDP 101/21-30 mmHg.;PCWP 30 mmHg, PAP 47/17 mmHg-mean 33 mmHg. RAP mean 22 mmHg. ? Secondary Pulmonary Hypertension    Patient Profile   60 y.o. male with history of diabetes presenting with hypercapnic respiratory failure,.  Diagnosed with NSTEMI, underwent left heart cath showing no angiographically evidence of CAD.  Subsequent work-up via MRI brain revealing right cerebellum and cerebral hemispheric infarct.  Assessment & Plan    1.  Respiratory failure -Intubated, sedated -Breathing trials as per ICU team  2.  NSTEMI,  -left heart cath with no angiographically evidence of CAD.  3. Mildly reduced EF 45% -consider Hydralazine, isosorbide -Cr improving -consider BB pending clinical course, BP  3.  CVA/cerebellar, cerebral infarct -Management per ICU team, neurology.  Overall prognosis remains guarded.  Continue continue with supportive measures as per ICU team.  No additional cardiac input at this point.  Recommend adding guideline directed beta-blocker if patient is able to be extubated successfully.  Total encounter time 35 minutes  Greater than 50% was spent  in counseling and coordination of care with the patient   Signed, Kate Sable, MD  12/28/2020, 1:01 PM

## 2020-12-28 NOTE — Progress Notes (Signed)
Neurology Progress Note  Patient ID: 60 yo man with hx obesity, diabetes, hypertension, combined systolic and diastolic heart failure presented to ED on May 7 in respiratory distress c/b NSTEMI and subsequently went into cardiac arrest in ED.  Neurology is consulted for prognostication after cardiac arrest  Interval events: - NAEON - STAT head CT performed today 2/2 persistent HTN and ?anisicoria was stable and showed no acute process and no signs of increased ICP - Patient is back on propofol for bp management - rEEG in progress, results pending   Exam: Vitals:   12/28/20 1815 12/28/20 1900  BP:  (!) 108/50  Pulse: 95 83  Resp: (!) 30 16  Temp: (!) 101.7 F (38.7 C)   SpO2: 100% 99%   Physical Exam Gen: obtunded HEENT: Atraumatic, normocephalic Resp: CTAB, no w/r/r CV: RRR, no m/g/r Abd: soft/NT/ND Extrem: Nml bulk; no cyanosis, clubbing, or edema.  Neuro: *MS: obtunded, occasionally stirs briefly in response to noxious stimuli *Speech: intubated, no attempt to speak *CN: PERRL, (+) corneals, oculocephalics, gag, cough *Motor & sensory: no spontaneous movement or movement to command of any extremity. No response to noxious stimuli in BUE. Withdrawal to noxious stimuli in both RLE and LLE.  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 Did not undergo TTM. Yesterday off sedation his exam showed brisk pupillary response, all brainstem reflexes  were intact, and he withdrew bilaterally to noxious stimuli in each foot. He had to be restarted on sedation 2/2 BP mgmt. His MRI does show e/o hypoxic ischemic damage (see above). Awaiting EEG and will continue to follow daily to assist in prognostication and GOC discussions.  Recommendations: 1) f/u EEG 2) F/u MRA H&N 3) I will call family to update them this evening  Will continue to follow   Bing Neighbors, MD [facility] [phone]  If 7pm- 7am, please page neurology on call as listed in AMION.

## 2020-12-29 ENCOUNTER — Encounter: Payer: Self-pay | Admitting: Pulmonary Disease

## 2020-12-29 DIAGNOSIS — Z7189 Other specified counseling: Secondary | ICD-10-CM | POA: Diagnosis not present

## 2020-12-29 DIAGNOSIS — R0602 Shortness of breath: Secondary | ICD-10-CM

## 2020-12-29 DIAGNOSIS — I469 Cardiac arrest, cause unspecified: Secondary | ICD-10-CM | POA: Diagnosis not present

## 2020-12-29 DIAGNOSIS — Z515 Encounter for palliative care: Secondary | ICD-10-CM

## 2020-12-29 DIAGNOSIS — N171 Acute kidney failure with acute cortical necrosis: Secondary | ICD-10-CM | POA: Diagnosis not present

## 2020-12-29 LAB — BLOOD GAS, ARTERIAL
Acid-base deficit: 4.5 mmol/L — ABNORMAL HIGH (ref 0.0–2.0)
Bicarbonate: 20.2 mmol/L (ref 20.0–28.0)
FIO2: 0.28
MECHVT: 550 mL
Mechanical Rate: 16
O2 Saturation: 95.5 %
PEEP: 5 cmH2O
Patient temperature: 37
RATE: 16 resp/min
pCO2 arterial: 35 mmHg (ref 32.0–48.0)
pH, Arterial: 7.37 (ref 7.350–7.450)
pO2, Arterial: 81 mmHg — ABNORMAL LOW (ref 83.0–108.0)

## 2020-12-29 LAB — BASIC METABOLIC PANEL
Anion gap: 9 (ref 5–15)
BUN: 33 mg/dL — ABNORMAL HIGH (ref 6–20)
CO2: 19 mmol/L — ABNORMAL LOW (ref 22–32)
Calcium: 8.3 mg/dL — ABNORMAL LOW (ref 8.9–10.3)
Chloride: 113 mmol/L — ABNORMAL HIGH (ref 98–111)
Creatinine, Ser: 1.86 mg/dL — ABNORMAL HIGH (ref 0.61–1.24)
GFR, Estimated: 41 mL/min — ABNORMAL LOW (ref >60)
Glucose, Bld: 124 mg/dL — ABNORMAL HIGH (ref 70–99)
Potassium: 4.3 mmol/L (ref 3.5–5.1)
Sodium: 141 mmol/L (ref 135–145)

## 2020-12-29 LAB — GLUCOSE, CAPILLARY
Glucose-Capillary: 101 mg/dL — ABNORMAL HIGH (ref 70–99)
Glucose-Capillary: 108 mg/dL — ABNORMAL HIGH (ref 70–99)
Glucose-Capillary: 112 mg/dL — ABNORMAL HIGH (ref 70–99)
Glucose-Capillary: 114 mg/dL — ABNORMAL HIGH (ref 70–99)
Glucose-Capillary: 119 mg/dL — ABNORMAL HIGH (ref 70–99)
Glucose-Capillary: 121 mg/dL — ABNORMAL HIGH (ref 70–99)

## 2020-12-29 LAB — CBC WITH DIFFERENTIAL/PLATELET
Abs Immature Granulocytes: 0.07 10*3/uL (ref 0.00–0.07)
Basophils Absolute: 0 10*3/uL (ref 0.0–0.1)
Basophils Relative: 0 %
Eosinophils Absolute: 0.2 10*3/uL (ref 0.0–0.5)
Eosinophils Relative: 2 %
HCT: 32.5 % — ABNORMAL LOW (ref 39.0–52.0)
Hemoglobin: 10.3 g/dL — ABNORMAL LOW (ref 13.0–17.0)
Immature Granulocytes: 1 %
Lymphocytes Relative: 29 %
Lymphs Abs: 2.9 10*3/uL (ref 0.7–4.0)
MCH: 25 pg — ABNORMAL LOW (ref 26.0–34.0)
MCHC: 31.7 g/dL (ref 30.0–36.0)
MCV: 78.9 fL — ABNORMAL LOW (ref 80.0–100.0)
Monocytes Absolute: 1.3 10*3/uL — ABNORMAL HIGH (ref 0.1–1.0)
Monocytes Relative: 12 %
Neutro Abs: 5.7 10*3/uL (ref 1.7–7.7)
Neutrophils Relative %: 56 %
Platelets: 203 10*3/uL (ref 150–400)
RBC: 4.12 MIL/uL — ABNORMAL LOW (ref 4.22–5.81)
RDW: 17.4 % — ABNORMAL HIGH (ref 11.5–15.5)
WBC: 10.2 10*3/uL (ref 4.0–10.5)
nRBC: 0 % (ref 0.0–0.2)

## 2020-12-29 LAB — PROTEIN ELECTROPHORESIS, SERUM
A/G Ratio: 0.9 (ref 0.7–1.7)
Albumin ELP: 3 g/dL (ref 2.9–4.4)
Alpha-1-Globulin: 0.2 g/dL (ref 0.0–0.4)
Alpha-2-Globulin: 0.6 g/dL (ref 0.4–1.0)
Beta Globulin: 0.7 g/dL (ref 0.7–1.3)
Gamma Globulin: 1.9 g/dL — ABNORMAL HIGH (ref 0.4–1.8)
Globulin, Total: 3.3 g/dL (ref 2.2–3.9)
Total Protein ELP: 6.3 g/dL (ref 6.0–8.5)

## 2020-12-29 LAB — MAGNESIUM: Magnesium: 2.3 mg/dL (ref 1.7–2.4)

## 2020-12-29 LAB — PHOSPHORUS: Phosphorus: 4.5 mg/dL (ref 2.5–4.6)

## 2020-12-29 MED ORDER — VITAL HIGH PROTEIN PO LIQD
1000.0000 mL | ORAL | Status: DC
  Administered 2020-12-29: 1000 mL

## 2020-12-29 MED ORDER — PROSOURCE TF PO LIQD
45.0000 mL | Freq: Four times a day (QID) | ORAL | Status: DC
  Administered 2020-12-29 – 2020-12-30 (×4): 45 mL
  Filled 2020-12-29 (×7): qty 45

## 2020-12-29 NOTE — Plan of Care (Signed)
Neuro: minimal pain response, stable on propofol Resp: stable on ventilator CV: afebrile, vital signs stable, A line removed GIGU: foley in place, tolerating tube feeds well, no BM, passing gas Skin: clean and dry, multiple skin tears-covered with foam, Intraosseous access site wound has drainage-swab pending Social: Brother called for update, all questions and concerns addressed.  Events:  Problem: Clinical Measurements: Goal: Ability to maintain clinical measurements within normal limits will improve Outcome: Not Progressing Goal: Will remain free from infection Outcome: Not Progressing Goal: Diagnostic test results will improve Outcome: Not Progressing Goal: Respiratory complications will improve Outcome: Not Progressing Goal: Cardiovascular complication will be avoided Outcome: Not Progressing   Problem: Nutrition: Goal: Adequate nutrition will be maintained Outcome: Not Progressing   Problem: Elimination: Goal: Will not experience complications related to bowel motility Outcome: Not Progressing Goal: Will not experience complications related to urinary retention Outcome: Not Progressing   Problem: Pain Managment: Goal: General experience of comfort will improve Outcome: Not Progressing   Problem: Safety: Goal: Ability to remain free from injury will improve Outcome: Not Progressing   Problem: Skin Integrity: Goal: Risk for impaired skin integrity will decrease Outcome: Not Progressing   Problem: Activity: Goal: Ability to tolerate increased activity will improve Outcome: Not Progressing   Problem: Respiratory: Goal: Ability to maintain a clear airway and adequate ventilation will improve Outcome: Not Progressing   Problem: Role Relationship: Goal: Method of communication will improve Outcome: Not Progressing

## 2020-12-29 NOTE — Progress Notes (Signed)
Neurology Progress Note  Patient ID: 60 yo man with hx obesity, diabetes, hypertension, combined systolic and diastolic heart failure presented to ED on May 7 in respiratory distress c/b NSTEMI and subsequently went into cardiac arrest in ED.  Neurology is consulted for prognostication after cardiac arrest  Interval events: - NAEON - Patient is back on propofol for bp management - Dr. Belia Heman initiated GOC discussions with family. I had extensive conversation by phone last night with pt's brother Nettie Cromwell [phone] regarding his poor prognosis. He is coordinating other brothers and sisters to make a decision about DNAR and further measures.  Interval data:  rEEG 12/28/20 interpreted by me This EEG was obtained while sedated and is abnormal due to moderate diffuse slowing and focal slowing over the right hemisphere indicating both global and R focal cerebral dysfunction. No electrographic seizures were identified.   Exam: Vitals:   12/29/20 1200 12/29/20 1300  BP: (!) 97/46 (!) 101/49  Pulse: 69 66  Resp: 19 (!) 21  Temp: 98.2 F (36.8 C)   SpO2: 100% 100%   Physical Exam Gen: obtunded HEENT: Atraumatic, normocephalic Resp: CTAB, no w/r/r CV: RRR, no m/g/r Abd: soft/NT/ND Extrem: Nml bulk; no cyanosis, clubbing, or edema.  Pt examined on sedation today, exam unchanged  Neuro: *MS: obtunded, occasionally stirs briefly in response to noxious stimuli *Speech: intubated, no attempt to speak *CN: PERRL, (+) corneals, oculocephalics, gag, cough *Motor & sensory: no spontaneous movement or movement to command of any extremity. No response to noxious stimuli in BUE. Withdrawal to noxious stimuli in both RLE and LLE.  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 Did not undergo TTM. Off sedation his exam has at times shows reactive pupils, intact brainstem reflees, and occasional withdrawal to noxious stimuli to either foot. His MRI does show e/o hypoxic ischemic damage (see above) and EEG corroborates severe global cerebral dysfunction with superimposed R focal dysfunction. I agree that GOC discussions are paramount at this time. The chance of him making a full recovery is diminishingly small. He would likely require trach/peg to survive and would require full care after that. I suggested that his family consider what Garvin would have wanted and if he would have considered that a desired outcome or a meaningful life. They will continue family discussions and update myself and Dr. Belia Heman with decisions or questions.  Recommendations: 1) Continue GOC discussions 2) F/u MRA H&N   Will continue to follow.   This patient is critically ill and at significant risk of neurological worsening, death and care requires constant monitoring of vital signs, hemodynamics,respiratory and cardiac monitoring, neurological assessment, discussion with family, other specialists and medical decision making of high complexity. I spent 75 minutes of neurocritical care time  in the care of  this patient. This was time spent independent of any time provided by nurse practitioner or PA.  Bing Neighbors, MD [facility] [phone]  If 7pm- 7am, please page neurology on call as listed in AMION.  Bing Neighbors, MD [facility] [phone]  If 7pm- 7am, please page neurology on call as listed in AMION.

## 2020-12-29 NOTE — Progress Notes (Signed)
PHARMACY CONSULT NOTE - FOLLOW UP  Pharmacy Consult for Electrolyte Monitoring and Replacement   Recent Labs: Potassium (mmol/L)  Date Value  12/29/2020 4.3   Magnesium (mg/dL)  Date Value  35/57/3220 2.3   Calcium (mg/dL)  Date Value  25/42/7062 8.3 (L)   Albumin (g/dL)  Date Value  37/62/8315 3.0 (L)   Phosphorus (mg/dL)  Date Value  17/61/6073 4.5   Sodium (mmol/L)  Date Value  12/29/2020 141   Corrected calcium = 9.1 mg/dL  Assessment:  60 year old male with obesity, diabetes mellitus, hypertension, and combined diastolic and systolic heart failure who presented to the emergency department yesterday in respiratory distress admitted following witnessed cardiac arrest. Pt is S/P CPR intubation. Pt with AKI on suspected CKD insetting of shock; nephrology following. Pharmacy consulted for electrolyte monitoring and replacement.  Lasix drip started 5/7 evening and discontinued 5/8. Vital High Protein @50ml /hr Free water 28mL q4h  Goal of Therapy:  Electrolytes WNL  Plan:   No replacement indicated at this time  Will follow with AM labs and replace as needed.  31m, PharmD Pharmacy Resident  12/29/2020 6:46 AM

## 2020-12-29 NOTE — Progress Notes (Signed)
Nutrition Brief Follow Up Note   Propofol increase  INTERVENTION:   Initiate Vital HP @40ml /hr + Pro-Source TF 65ml QID via tube   Propofol 30.78ml/hr- provides 797kcal/day   Free water flushes 37ml q4 hours to maintain tube patency   Regimen provides 1917kcal/day, 128g/day protein and 965ml/day free water   Estimated Nutritional Needs:   Kcal:  1384-1761kcal/day  Protein:  >125g/day  Fluid:  2.1-2.4L/day  87m MS, RD, LDN Please refer to [facility] for RD and/or RD on-call/weekend/after hours pager

## 2020-12-29 NOTE — Progress Notes (Signed)
NAME:  Jeremy Browning, MRN:  [id number], DOB:  Mar 20, 1961, LOS: 4 ADMISSION DATE:  12/25/2020  60 yo M presenting to [facility] ED from home via EMS with respiratory distress that had been progressively worsening over the course of 12/25/20. Per documentation family stated the patient was in his normal state of health until this morning when he began feeling unwell. He then called out stating he could not breathe. EMS found the patient hypoxic and in distress with SpO2 in the 70's on room air. Patient was place on CPAP, arriving to the ED unresponsive and pulseless. ED course: Upon arrival to ED patient was defibrillated x 1 and received CPR before achieving ROSC. Initial EKG suggestive of global ischemia, second EKG had possible LBBB vs IVCD. Patient was emergently intubated requiring mechanical ventilation. Per ED documentation STEMI team activated in the setting of a witnessed shockable cardiac arrest with clinical exam highly suggestive of pulmonary edema with pink frothy sputum throughout ET tubing.  Pertinent Medical History  Obesity Hypertension Diabetes mellitus Chronic renal insufficiency Combined systolic and diastolic heart failure  Significant Hospital Events: Including procedures, antibiotic start and stop dates in addition to other pertinent events   Cardiac catheterization on presentation with normal coronaries, good cardiac output, elevated pulmonary capillary wedge pressure.  5/7 severe resp failure 5/8 remains on Vent 5/9 remains on vent , multiorgan failure 5/10 remains on vent, multiorgan failure, brother updated  MICRO DATA 5/7 STAPHYLOCOCCUS HOMINIS 1/4 cultures MRSA NEG 5/8 sputum cx pending  COVID  And INF A/B NEG    Antibiotics Given (last 72 hours)    Date/Time Action Medication Dose Rate   12/26/20 0832 New Bag/Given   doxycycline (VIBRAMYCIN) 100 mg in sodium chloride 0.9 % 250 mL IVPB 100 mg 125 mL/hr   12/26/20 0916 New Bag/Given    ampicillin-sulbactam (UNASYN) 1.5 g in sodium chloride 0.9 % 100 mL IVPB 1.5 g 200 mL/hr   12/26/20 1428 New Bag/Given   ampicillin-sulbactam (UNASYN) 1.5 g in sodium chloride 0.9 % 100 mL IVPB 1.5 g 200 mL/hr   12/26/20 2004 New Bag/Given   ampicillin-sulbactam (UNASYN) 1.5 g in sodium chloride 0.9 % 100 mL IVPB 1.5 g 200 mL/hr   12/26/20 2119 New Bag/Given   doxycycline (VIBRAMYCIN) 100 mg in sodium chloride 0.9 % 250 mL IVPB 100 mg 125 mL/hr   12/27/20 0156 New Bag/Given   ampicillin-sulbactam (UNASYN) 1.5 g in sodium chloride 0.9 % 100 mL IVPB 1.5 g 200 mL/hr   12/27/20 0831 New Bag/Given   ampicillin-sulbactam (UNASYN) 1.5 g in sodium chloride 0.9 % 100 mL IVPB 1.5 g 200 mL/hr   12/27/20 4401 New Bag/Given   doxycycline (VIBRAMYCIN) 100 mg in sodium chloride 0.9 % 250 mL IVPB 100 mg 125 mL/hr   12/27/20 1510 New Bag/Given   ampicillin-sulbactam (UNASYN) 1.5 g in sodium chloride 0.9 % 100 mL IVPB 1.5 g 200 mL/hr   12/27/20 1935 New Bag/Given   ampicillin-sulbactam (UNASYN) 1.5 g in sodium chloride 0.9 % 100 mL IVPB 1.5 g 200 mL/hr   12/28/20 0222 New Bag/Given   ampicillin-sulbactam (UNASYN) 1.5 g in sodium chloride 0.9 % 100 mL IVPB 1.5 g 200 mL/hr   12/28/20 0751 New Bag/Given   ampicillin-sulbactam (UNASYN) 1.5 g in sodium chloride 0.9 % 100 mL IVPB 1.5 g 200 mL/hr   12/28/20 1522 New Bag/Given   Ampicillin-Sulbactam (UNASYN) 3 g in sodium chloride 0.9 % 100 mL IVPB 3 g 200 mL/hr   12/28/20 2058 New Bag/Given  Ampicillin-Sulbactam (UNASYN) 3 g in sodium chloride 0.9 % 100 mL IVPB 3 g 200 mL/hr   12/29/20 0245 New Bag/Given   Ampicillin-Sulbactam (UNASYN) 3 g in sodium chloride 0.9 % 100 mL IVPB 3 g 200 mL/hr         Interim History / Subjective:  Remains intubated +neurogenic fevers Signs of severe brain damage     Objective   Blood pressure (!) 106/55, pulse 81, temperature 99.4 F (37.4 C), temperature source Axillary, resp. rate (!) 21, height 6' (1.829 m),  weight 125.6 kg, SpO2 100 %.    Vent Mode: PRVC FiO2 (%):  [28 %] 28 % Set Rate:  [16 bmp] 16 bmp Vt Set:  [550 mL] 550 mL PEEP:  [5 cmH20] 5 cmH20   Intake/Output Summary (Last 24 hours) at 12/29/2020 0736 Last data filed at 12/29/2020 0600 Gross per 24 hour  Intake 3082.26 ml  Output 2385 ml  Net 697.26 ml   Filed Weights   12/26/20 0426 12/27/20 0412 12/28/20 0431  Weight: 127.8 kg 125.8 kg 125.6 kg      REVIEW OF SYSTEMS  PATIENT IS UNABLE TO PROVIDE COMPLETE REVIEW OF SYSTEMS DUE TO SEVERE CRITICAL ILLNESS AND TOXIC METABOLIC ENCEPHALOPATHY   PHYSICAL EXAMINATION:  GENERAL:critically ill appearing, +resp distress HEAD: Normocephalic, atraumatic.  EYES: Pupils equal, round, reactive to light.  No scleral icterus.  MOUTH: Moist mucosal membrane. NECK: Supple. PULMONARY: +rhonchi, +wheezing CARDIOVASCULAR: S1 and S2. Regular rate and rhythm. No murmurs, rubs, or gallops.  GASTROINTESTINAL: Soft, nontender, -distended. Positive bowel sounds.  MUSCULOSKELETAL: No swelling, clubbing, or edema.  NEUROLOGIC: obtunded SKIN:intact,warm,dry   Labs/imaging that I havepersonally reviewed  (right click and "Reselect all SmartList Selections" daily)        ASSESSMENT AND PLAN SYNOPSIS  60 yo morbidly obese AAM with acute and severe cardiac arrest NSTEMI with acute severe systolic cardiac failure with signs and symptoms of brain damage    Severe ACUTE Hypoxic and Hypercapnic Respiratory Failure -continue Mechanical Ventilator support -continue Bronchodilator Therapy -Wean Fio2 and PEEP as tolerated -VAP/VENT bundle implementation Unable to wean from vent, he will need TRACH AND PEG TUBE TO SURVIVE    CARDIAC FAILURE-acute combined systolic/diastolic dysfunction -oxygen as needed -Lasix as tolerated -follow up cardiac enzymes as indicated   CARDIAC ICU monitoring   ACUTE KIDNEY INJURY/Renal Failure -continue Foley Catheter-assess need -Avoid nephrotoxic  agents -Follow urine output, BMP -Ensure adequate renal perfusion, optimize oxygenation -Renal dose medications   NEUROLOGY Acute toxic metabolic encephalopathy, need for sedation Goal RASS -2 to -3   NEUROGENIC SHOCK -use vasopressors to keep MAP>65 as needed   INFECTIOUS DISEASE NEUROGENIC FEVERS CULTURES AS NEEDED -continue antibiotics as prescribed -follow up cultures   ENDO - ICU hypoglycemic\Hyperglycemia protocol -check FSBS per protocol   GI GI PROPHYLAXIS as indicated  NUTRITIONAL STATUS DIET-->TF's as tolerated Constipation protocol as indicated   ELECTROLYTES -follow labs as needed -replace as needed -pharmacy consultation and following     Best practice (right click and "Reselect all SmartList Selections" daily)  Diet:NPO Pain/Anxiety/Delirium protocol (if indicated):Yes(RASS goal -1,-2) VAP protocol (if indicated):Yes DVT prophylaxis:Subcutaneous Heparin GI prophylaxis:H2B Glucose control:SSIYes Central venous access:Yes, and it is still needed Arterial line:Yes, and it is still needed Foley:Yes, and it is still needed Mobility:bed rest PT consulted:No Code Status:full code Disposition:ICU      Labs   CBC: Recent Labs  Lab 12/25/20 1451 12/26/20 0420 12/27/20 0407 12/28/20 0429 12/29/20 0420  WBC 17.7* 13.2* 8.8 9.1 10.2  NEUTROABS  5.1  --   --   --  5.7  HGB 14.0 12.8* 10.7* 10.0* 10.3*  HCT 46.6 40.7 32.8* 32.2* 32.5*  MCV 83.4 79.3* 77.9* 78.7* 78.9*  PLT 171 243 155 173 203    Basic Metabolic Panel: Recent Labs  Lab 12/25/20 1545 12/25/20 1651 12/25/20 1905 12/26/20 0420 12/27/20 0407 12/28/20 0429 12/29/20 0420  NA 135  --   --  135 136 138 141  K 5.5* 5.5*  --  5.4* 4.0 3.8 4.3  CL 106  --   --  108 111 112* 113*  CO2 23  --   --  18* 22 19* 19*  GLUCOSE 267*  --   --  160* 114* 146* 124*  BUN 21*  --   --  27* 29* 28* 33*  CREATININE 2.16*  --   --  2.53* 2.35* 1.82* 1.86*   CALCIUM 8.0*  --   --  7.7* 7.8* 8.2* 8.3*  MG  --   --  2.1 1.7 1.9 2.3 2.3  PHOS  --   --  7.9* 4.0 2.5 3.1 4.5   GFR: Estimated Creatinine Clearance: 57.8 mL/min (A) (by C-G formula based on SCr of 1.86 mg/dL (H)). Recent Labs  Lab 12/25/20 1451 12/25/20 1545 12/25/20 1843 12/26/20 0420 12/27/20 0407 12/28/20 0429 12/29/20 0420  PROCALCITON  --  <0.10  --  33.57 23.40  --   --   WBC 17.7*  --   --  13.2* 8.8 9.1 10.2  LATICACIDVEN 7.8*  --  1.9  --   --   --   --     Liver Function Tests: Recent Labs  Lab 12/25/20 1545 12/26/20 0420  AST 85* 73*  ALT 58* 46*  ALKPHOS 82 62  BILITOT 0.6 0.8  PROT 7.9 7.4  ALBUMIN 3.2* 3.0*   No results for input(s): LIPASE, AMYLASE in the last 168 hours. No results for input(s): AMMONIA in the last 168 hours.  ABG    Component Value Date/Time   PHART 7.33 (L) 12/27/2020 1032   PCO2ART 39 12/27/2020 1032   PO2ART 65 (L) 12/27/2020 1032   HCO3 20.6 12/27/2020 1032   ACIDBASEDEF 4.9 (H) 12/27/2020 1032   O2SAT 90.8 12/27/2020 1032     Coagulation Profile: Recent Labs  Lab 12/25/20 1451  INR 1.2    Cardiac Enzymes: No results for input(s): CKTOTAL, CKMB, CKMBINDEX, TROPONINI in the last 168 hours.  HbA1C: Hgb A1c MFr Bld  Date/Time Value Ref Range Status  12/25/2020 04:46 PM 5.9 (H) 4.8 - 5.6 % Final    Comment:    (NOTE) Pre diabetes:          5.7%-6.4%  Diabetes:              >6.4%  Glycemic control for   <7.0% adults with diabetes     CBG: Recent Labs  Lab 12/28/20 1509 12/28/20 1943 12/28/20 2344 12/29/20 0352 12/29/20 0731  GLUCAP 91 121* 110* 114* 121*    Allergies Not on File     DVT/GI PRX  assessed I Assessed the need for Labs I Assessed the need for Foley I Assessed the need for Central Venous Line Family Discussion when available I Assessed the need for Mobilization I made an Assessment of medications to be adjusted accordingly Safety Risk assessment completed  CASE DISCUSSED  IN MULTIDISCIPLINARY ROUNDS WITH ICU TEAM     Critical Care Time devoted to patient care services described in this note is  60 minutes.  Critical care was necessary to treat or prevent imminent or life-threatening deterioration. Overall, patient is critically ill, prognosis is guarded.  Patient with Multiorgan failure and at high risk for cardiac arrest and death.    Lucie Leather, M.D.  [facility]  Medical Director [facility] Medical Director [facility] Cardio-Pulmonary Department

## 2020-12-29 NOTE — Consult Note (Signed)
Consultation Note Date: 12/29/2020   Patient Name: Jeremy Browning  DOB: 18-May-1961  MRN: [id number]  Age / Sex: 60 y.o., male  PCP: No primary care provider on file. Referring Physician: Flora Lipps, MD  Reason for Consultation: Establishing goals of care and Psychosocial/spiritual support  HPI/Patient Profile: 60 y.o. 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   Clinical Assessment and Goals of Care: I have reviewed medical records including EPIC notes, labs and imaging, received report from RN, assessed the patient.  Jeremy Browning, Jeremy Browning, is lying quietly in bed.  He is intubated/ventilated and sedated.  There is no family at bedside at this time.    Call to brother/healthcare surrogate, Huber Mathers to discuss diagnosis prognosis, [place], EOL wishes, disposition and options.   I introduced Palliative Medicine as specialized medical care for people living with serious illness. It focuses on providing relief from the symptoms and stress of a serious illness. The goal is to improve quality of life for both the patient and the family.  We discussed a brief life review of the patient.  Corene Cornea tells me that Jeremy Browning has been living with Corene Cornea and his wife for the last 14 years.  Their parents are deceased, and although they have brothers and sisters, they live out of state.  Corene Cornea shares that Jeremy Browning has had "poor comprehension" since childhood.  He has worked simple jobs such as moving.    We then focused on their current illness.  We talked about respiratory failure and ventilator settings, acute kidney injury with recovery, neurological testing.  Corene Cornea seems to have a grasp on Jeremy Browning's acute health concerns.  We talked about  further testing and time for outcomes.  We talked about "meaningful improvements".  I attempted to elicit values and goals of care important to the patient.  Corene Cornea shares that he understands he will be called to make difficult choices in the next days to weeks.  Discussed the importance of continued conversation with family and the medical providers regarding overall plan of care and treatment options, ensuring decisions are within the context of the patient's values and GOCs.   Questions and concerns were addressed. The family was encouraged to call with questions or concerns.  PMT will continue to support holistically.  Conference with attending, bedside nursing staff, transition of care team related to patient condition, needs, goals of care.  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  SUMMARY OF RECOMMENDATIONS   At this point continue to treat the treatable Time for outcomes PMT to continue to follow  Code Status/Advance Care Planning:  Full code -PMT to continue CODE STATUS discussions  Symptom Management:   Per CCM, no additional needs at this time.  Palliative Prophylaxis:   Oral Care and Turn Reposition  Additional Recommendations (  Limitations, Scope, Preferences):  Full Scope Treatment -PMT to continue CODE STATUS discussions  Psycho-social/Spiritual:   Desire for further Chaplaincy support:no  Additional Recommendations: Caregiving  Support/Resources  Prognosis:   Unable to determine, based on outcomes.  Guarded at this point  Discharge Planning: To be determined, based on outcomes.  Guarded at this point      Primary Diagnoses: Present on Admission: . Cardiogenic shock ([place]) . Cardiopulmonary arrest with successful resuscitation [facility])   I have reviewed the medical record, interviewed the patient and family, and examined the patient. The following aspects are  pertinent.  Past Medical History:  Diagnosis Date  . Diabetes mellitus type II, non insulin dependent ([place])   . Essential hypertension Raquel Sarna like  . Heart failure ([place])    Unknown details.  By report no cardiac surgery  . Venous stasis dermatitis of left lower extremity    Social History   Socioeconomic History  . Marital status: Not on file    Spouse name: Not on file  . Number of children: Not on file  . Years of education: Not on file  . Highest education level: Not on file  Occupational History  . Not on file  Tobacco Use  . Smoking status: Not on file  . Smokeless tobacco: Not on file  Substance and Sexual Activity  . Alcohol use: Not on file  . Drug use: Not on file  . Sexual activity: Not on file  Other Topics Concern  . Not on file  Social History Narrative   He lives with his brother and the brother's family.  Recently moved here to the [place] area.  Has never been established here at [facility].   Social Determinants of Health   Financial Resource Strain: Not on file  Food Insecurity: Not on file  Transportation Needs: Not on file  Physical Activity: Not on file  Stress: Not on file  Social Connections: Not on file   Family History  Family history unknown: Yes   Scheduled Meds: . chlorhexidine gluconate (MEDLINE KIT)  15 mL Mouth Rinse BID  . Chlorhexidine Gluconate Cloth  6 each Topical Q0600  . docusate  100 mg Per Tube BID  . famotidine  20 mg Per Tube Daily  . feeding supplement (PROSource TF)  45 mL Per Tube QID  . feeding supplement (VITAL HIGH PROTEIN)  1,000 mL Per Tube Q24H  . free water  30 mL Per Tube Q4H  . heparin  5,000 Units Subcutaneous Q8H  . hydrALAZINE  25 mg Per Tube Q6H  . insulin aspart  0-15 Units Subcutaneous Q4H  . isosorbide dinitrate  10 mg Per Tube Q6H  . mouth rinse  15 mL Mouth Rinse 10 times per day  . polyethylene glycol  17 g Per Tube Daily  . sodium chloride flush  10-40 mL Intracatheter Q12H  . sodium chloride flush   3 mL Intravenous Q12H   Continuous Infusions: . sodium chloride    . sodium chloride    . ampicillin-sulbactam (UNASYN) IV Stopped (12/29/20 0850)  . propofol (DIPRIVAN) infusion 40 mcg/kg/min (12/29/20 1249)   PRN Meds:.sodium chloride, acetaminophen (TYLENOL) oral liquid 160 mg/5 mL, albuterol, docusate, fentaNYL (SUBLIMAZE) injection, hydrALAZINE, midazolam, polyethylene glycol, sodium chloride flush, sodium chloride flush Medications Prior to Admission:  Prior to Admission medications   Medication Sig Start Date End Date Taking? Authorizing Provider  aspirin EC 81 MG tablet Take 81 mg by mouth daily. Swallow whole.   Yes [provider]  atorvastatin (  LIPITOR) 80 MG tablet Take 80 mg by mouth daily.   Yes [provider]  carvedilol (COREG) 12.5 MG tablet Take 12.5 mg by mouth 2 (two) times daily with a meal.   Yes [provider]  furosemide (LASIX) 80 MG tablet Take 80 mg by mouth daily.   Yes [provider]  losartan (COZAAR) 50 MG tablet Take 50 mg by mouth daily.   Yes [provider]   Not on File Review of Systems  Unable to perform ROS: Intubated    Physical Exam Vitals and nursing note reviewed.  Constitutional:      General: He is not in acute distress.    Appearance: He is obese. He is ill-appearing.  Cardiovascular:     Rate and Rhythm: Normal rate.  Pulmonary:     Effort: Pulmonary effort is normal. No respiratory distress.  Abdominal:     Palpations: Abdomen is soft.     Comments: Obese abdomen  Skin:    General: Skin is warm and dry.  Neurological:     Comments: Intubated/sedated     Vital Signs: BP (!) 101/49   Pulse 66   Temp 98.2 F (36.8 C) (Axillary)   Resp (!) 21   Ht 6' (1.829 m)   Wt 125.6 kg   SpO2 100%   BMI 37.55 kg/m  Pain Scale: CPOT       SpO2: SpO2: 100 % O2 Device:SpO2: 100 % O2 Flow Rate: .   IO: Intake/output summary:   Intake/Output Summary (Last 24 hours) at 12/29/2020  1405 Last data filed at 12/29/2020 1200 Gross per 24 hour  Intake 2201.83 ml  Output 2690 ml  Net -488.17 ml    LBM: Last BM Date:  (PTA) Baseline Weight: Weight: 120 kg Most recent weight: Weight: 125.6 kg     Palliative Assessment/Data:   Flowsheet Rows   Flowsheet Row Most Recent Value  Intake Tab   Referral Department Hospitalist  Unit at Time of Referral ICU  Date Notified 12/28/20  Palliative Care Type New Palliative care  Reason for referral Clarify Goals of Care  Date of Admission 12/25/20  Date first seen by Palliative Care 12/29/20  # of days Palliative referral response time 1 Day(s)  # of days IP prior to Palliative referral 3  Clinical Assessment   Palliative Performance Scale Score 20%  Pain Max last 24 hours Not able to report  Pain Min Last 24 hours Not able to report  Dyspnea Max Last 24 Hours Not able to report  Dyspnea Min Last 24 hours Not able to report  Psychosocial & Spiritual Assessment   Palliative Care Outcomes       Time In: 1330 Time Out: 1420 Time Total: 50 minutes  Greater than 50%  of this time was spent counseling and coordinating care related to the above assessment and plan.  Signed by: Drue Novel, NP   Please contact Palliative Medicine Team phone at [phone] for questions and concerns.  For individual provider: See Shea Evans

## 2020-12-29 NOTE — Progress Notes (Signed)
[facility]  ROUNDING NOTE   Subjective:   UOP 2326m.   Creatinine 1.86 (1.82) (2.35) (2.53)  Responds to painful stimuli.   No seizure activity on EEG.   Objective:  Vital signs in last 24 hours:  Temp:  [98.7 F (37.1 C)-102.2 F (39 C)] 98.7 F (37.1 C) (05/11 0800) Pulse Rate:  [75-102] 75 (05/11 0800) Resp:  [16-35] 21 (05/11 0800) BP: (91-177)/(44-71) 99/50 (05/11 0800) SpO2:  [99 %-100 %] 100 % (05/11 0800) Arterial Line BP: (95-193)/(43-81) 95/43 (05/11 0800) FiO2 (%):  [28 %] 28 % (05/11 0415)  Weight change:  Filed Weights   12/26/20 0426 12/27/20 0412 12/28/20 0431  Weight: 127.8 kg 125.8 kg 125.6 kg    Intake/Output: I/O last 3 completed shifts: In: 3372.9 [I.V.:550.9; NG/GT:2222.2; IV Piggyback:599.9] Out: 3110 [Urine:3110]   Intake/Output this shift:  Total I/O In: 44 [I.V.:44] Out: 300 [Urine:300]  Physical Exam: General: Critically ill  Head: ETT   Eyes: Eyes closed  Neck: trachea midline  Lungs:  PRVC FiO2 100%  Heart: Regular rate and rhythm  Abdomen:  Soft   Extremities:  + peripheral edema.  Neurologic: Intubated and sedated  Skin: No lesions  Access: Left femoral vascath 5/8    Basic Metabolic Panel: Recent Labs  Lab 12/25/20 1545 12/25/20 1651 12/25/20 1905 12/26/20 0420 12/27/20 0407 12/28/20 0429 12/29/20 0420  NA 135  --   --  135 136 138 141  K 5.5* 5.5*  --  5.4* 4.0 3.8 4.3  CL 106  --   --  108 111 112* 113*  CO2 23  --   --  18* 22 19* 19*  GLUCOSE 267*  --   --  160* 114* 146* 124*  BUN 21*  --   --  27* 29* 28* 33*  CREATININE 2.16*  --   --  2.53* 2.35* 1.82* 1.86*  CALCIUM 8.0*  --   --  7.7* 7.8* 8.2* 8.3*  MG  --   --  2.1 1.7 1.9 2.3 2.3  PHOS  --   --  7.9* 4.0 2.5 3.1 4.5    Liver Function Tests: Recent Labs  Lab 12/25/20 1545 12/26/20 0420  AST 85* 73*  ALT 58* 46*  ALKPHOS 82 62  BILITOT 0.6 0.8  PROT 7.9 7.4  ALBUMIN 3.2* 3.0*   No results for input(s): LIPASE, AMYLASE in  the last 168 hours. No results for input(s): AMMONIA in the last 168 hours.  CBC: Recent Labs  Lab 12/25/20 1451 12/26/20 0420 12/27/20 0407 12/28/20 0429 12/29/20 0420  WBC 17.7* 13.2* 8.8 9.1 10.2  NEUTROABS 5.1  --   --   --  5.7  HGB 14.0 12.8* 10.7* 10.0* 10.3*  HCT 46.6 40.7 32.8* 32.2* 32.5*  MCV 83.4 79.3* 77.9* 78.7* 78.9*  PLT 171 243 155 173 203    Cardiac Enzymes: No results for input(s): CKTOTAL, CKMB, CKMBINDEX, TROPONINI in the last 168 hours.  BNP: Invalid input(s): POCBNP  CBG: Recent Labs  Lab 12/28/20 1509 12/28/20 1943 12/28/20 2344 12/29/20 0352 12/29/20 0731  GLUCAP 91 121* 110* 114* 121*    Microbiology: Results for orders placed or performed during the hospital encounter of 12/25/20  Culture, blood (routine x 2)     Status: Abnormal   Collection Time: 12/25/20  2:55 PM   Specimen: BLOOD  Result Value Ref Range Status   Specimen Description   Final    BLOOD BLOOD RIGHT HAND Performed at [facility],  [place], [place] [address]    Special Requests   Final    BOTTLES DRAWN AEROBIC AND ANAEROBIC Blood Culture adequate volume Performed at [facility], [address] [place], [place] [address]    Culture  Setup Time   Final    GRAM POSITIVE COCCI AEROBIC BOTTLE ONLY CRITICAL RESULT CALLED TO, READ BACK BY AND VERIFIED WITH: MORGAN CUNNIGHAN AT 1448 ON 12/26/20 BY SS    Culture (A)  Final    STAPHYLOCOCCUS HOMINIS THE SIGNIFICANCE OF ISOLATING THIS ORGANISM FROM A SINGLE SET OF BLOOD CULTURES WHEN MULTIPLE SETS ARE DRAWN IS UNCERTAIN. PLEASE NOTIFY THE MICROBIOLOGY DEPARTMENT WITHIN ONE WEEK IF SPECIATION AND SENSITIVITIES ARE REQUIRED. Performed at [facility], [place] [address], [place], [place] [address]    Report Status 12/28/2020 FINAL  Final  Blood Culture ID Panel (Reflexed)     Status: Abnormal   Collection Time: 12/25/20  2:55 PM  Result Value Ref Range Status   Enterococcus faecalis NOT  DETECTED NOT DETECTED Final   Enterococcus Faecium NOT DETECTED NOT DETECTED Final   Listeria monocytogenes NOT DETECTED NOT DETECTED Final   Staphylococcus species DETECTED (A) NOT DETECTED Final    Comment: CRITICAL RESULT CALLED TO, READ BACK BY AND VERIFIED WITH: MORGAN CUNNINGHAN AT 1159 ON 12/26/20 BY SS    Staphylococcus aureus (BCID) NOT DETECTED NOT DETECTED Final   Staphylococcus epidermidis NOT DETECTED NOT DETECTED Final   Staphylococcus lugdunensis NOT DETECTED NOT DETECTED Final   Streptococcus species NOT DETECTED NOT DETECTED Final   Streptococcus agalactiae NOT DETECTED NOT DETECTED Final   Streptococcus pneumoniae NOT DETECTED NOT DETECTED Final   Streptococcus pyogenes NOT DETECTED NOT DETECTED Final   A.calcoaceticus-baumannii NOT DETECTED NOT DETECTED Final   Bacteroides fragilis NOT DETECTED NOT DETECTED Final   Enterobacterales NOT DETECTED NOT DETECTED Final   Enterobacter cloacae complex NOT DETECTED NOT DETECTED Final   Escherichia coli NOT DETECTED NOT DETECTED Final   Klebsiella aerogenes NOT DETECTED NOT DETECTED Final   Klebsiella oxytoca NOT DETECTED NOT DETECTED Final   Klebsiella pneumoniae NOT DETECTED NOT DETECTED Final   Proteus species NOT DETECTED NOT DETECTED Final   Salmonella species NOT DETECTED NOT DETECTED Final   Serratia marcescens NOT DETECTED NOT DETECTED Final   Haemophilus influenzae NOT DETECTED NOT DETECTED Final   Neisseria meningitidis NOT DETECTED NOT DETECTED Final   Pseudomonas aeruginosa NOT DETECTED NOT DETECTED Final   Stenotrophomonas maltophilia NOT DETECTED NOT DETECTED Final   Candida albicans NOT DETECTED NOT DETECTED Final   Candida auris NOT DETECTED NOT DETECTED Final   Candida glabrata NOT DETECTED NOT DETECTED Final   Candida krusei NOT DETECTED NOT DETECTED Final   Candida parapsilosis NOT DETECTED NOT DETECTED Final   Candida tropicalis NOT DETECTED NOT DETECTED Final   Cryptococcus neoformans/gattii NOT DETECTED  NOT DETECTED Final    Comment: Performed at [facility], [address] [place],  [address]  Resp Panel by RT-PCR (Flu A&B, Covid) Nasopharyngeal Swab     Status: None   Collection Time: 12/25/20  5:51 PM   Specimen: Nasopharyngeal Swab; Nasopharyngeal(NP) swabs in vial transport medium  Result Value Ref Range Status   SARS Coronavirus 2 by RT PCR NEGATIVE NEGATIVE Final    Comment: (NOTE) SARS-CoV-2 target nucleic acids are NOT DETECTED.  The SARS-CoV-2 RNA is generally detectable in upper respiratory specimens during the acute phase of infection. The lowest concentration of SARS-CoV-2 viral copies this assay can detect  is 138 copies/mL. A negative result does not preclude SARS-Cov-2 infection and should not be used as the sole basis for treatment or other patient management decisions. A negative result may occur with  improper specimen collection/handling, submission of specimen other than nasopharyngeal swab, presence of viral mutation(s) within the areas targeted by this assay, and inadequate number of viral copies(<138 copies/mL). A negative result must be combined with clinical observations, patient history, and epidemiological information. The expected result is Negative.  Fact Sheet for Patients:  EntrepreneurPulse.com.au  Fact Sheet for Healthcare Providers:  IncredibleEmployment.be  This test is no t yet approved or cleared by the [place] FDA and  has been authorized for detection and/or diagnosis of SARS-CoV-2 by FDA under an Emergency Use Authorization (EUA). This EUA will remain  in effect (meaning this test can be used) for the duration of the COVID-19 declaration under Section 564(b)(1) of the Act, 21 U.S.C.section 360bbb-3(b)(1), unless the authorization is terminated  or revoked sooner.       Influenza A by PCR NEGATIVE NEGATIVE Final   Influenza B by PCR NEGATIVE NEGATIVE Final    Comment:  (NOTE) The Xpert Xpress SARS-CoV-2/FLU/RSV plus assay is intended as an aid in the diagnosis of influenza from Nasopharyngeal swab specimens and should not be used as a sole basis for treatment. Nasal washings and aspirates are unacceptable for Xpert Xpress SARS-CoV-2/FLU/RSV testing.  Fact Sheet for Patients: EntrepreneurPulse.com.au  Fact Sheet for Healthcare Providers: IncredibleEmployment.be  This test is not yet approved or cleared by the [place] FDA and has been authorized for detection and/or diagnosis of SARS-CoV-2 by FDA under an Emergency Use Authorization (EUA). This EUA will remain in effect (meaning this test can be used) for the duration of the COVID-19 declaration under Section 564(b)(1) of the Act, 21 U.S.C. section 360bbb-3(b)(1), unless the authorization is terminated or revoked.  Performed at [facility], [address] [place], [place] [address]   MRSA PCR Screening     Status: None   Collection Time: 12/25/20  6:39 PM   Specimen: Nasopharyngeal  Result Value Ref Range Status   MRSA by PCR NEGATIVE NEGATIVE Final    Comment:        The GeneXpert MRSA Assay (FDA approved for NASAL specimens only), is one component of a comprehensive MRSA colonization surveillance program. It is not intended to diagnose MRSA infection nor to guide or monitor treatment for MRSA infections. Performed at [facility], [address] [place], [place] [address]   Culture, blood (routine x 2)     Status: None (Preliminary result)   Collection Time: 12/25/20  6:43 PM   Specimen: BLOOD  Result Value Ref Range Status   Specimen Description BLOOD Blood Culture adequate volume  Final   Special Requests   Final    BOTTLES DRAWN AEROBIC AND ANAEROBIC LEFT ANTECUBITAL   Culture   Final    NO GROWTH 4 DAYS Performed at [facility], [address], [place], [place] [address]    Report Status PENDING   Incomplete  Culture, Respiratory w Gram Stain     Status: None   Collection Time: 12/26/20  6:27 AM   Specimen: Tracheal Aspirate; Respiratory  Result Value Ref Range Status   Specimen Description   Final    TRACHEAL ASPIRATE Performed at [facility], [address], [place], [place] [address]    Special Requests   Final    NONE Performed at [facility], [address] [place], [place] [address]    Gram Stain   Final    FEW WBC PRESENT,BOTH PMN AND MONONUCLEAR RARE GRAM POSITIVE COCCI IN PAIRS    Culture   Final    RARE Normal respiratory flora-no Staph aureus or Pseudomonas seen Performed at [facility], [address] [place], [place] [address]    Report Status 12/28/2020 FINAL  Final    Coagulation Studies: No results for input(s): LABPROT, INR in the last 72 hours.  Urinalysis: No results for input(s): COLORURINE, LABSPEC, PHURINE, GLUCOSEU, HGBUR, BILIRUBINUR, KETONESUR, PROTEINUR, UROBILINOGEN, NITRITE, LEUKOCYTESUR in the last 72 hours.  Invalid input(s): APPERANCEUR    Imaging: CT HEAD WO CONTRAST  Result Date: 12/28/2020 CLINICAL DATA:  Neuro deficit. Acute stroke suspected. Hypertensive. EXAM: CT HEAD WITHOUT CONTRAST TECHNIQUE: Contiguous axial images were obtained from the base of the skull through the vertex without intravenous contrast. COMPARISON:  MR on 12/27/2020 FINDINGS: Brain: There is encephalomalacia in the LEFT parieto-occipital lobe, consistent with remote infarct. Remote lacunar infarct identified within the RIGHT basal ganglia. There is central and cortical atrophy. There is no intra or extra-axial fluid collection or mass lesion. The basilar cisterns and ventricles have a normal appearance. There is no CT evidence for acute infarction or hemorrhage. Vascular: There is atherosclerotic calcification of the internal carotid arteries. A hyperdense vessels. Skull: Normal. Negative for fracture or focal lesion. Sinuses/Orbits:  Orbits are unremarkable. Small amounts of fluid and/or mucosal thickening within the paranasal sinuses. Small RIGHT mastoid effusion. Other: Patient is intubated. Study quality is degraded by patient motion artifact. IMPRESSION: 1. 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 2.  No evidence for acute  abnormality. Electronically Signed   By: Nolon Nations M.D.   On: 12/28/2020 12:07   MR BRAIN WO CONTRAST  Result Date: 12/27/2020 CLINICAL DATA:  Anoxic brain damage.  Cardiac arrest. EXAM: MRI HEAD WITHOUT CONTRAST TECHNIQUE: Multiplanar, multiecho pulse sequences of the brain and surrounding structures were obtained without intravenous contrast. COMPARISON:  None. FINDINGS: Brain: There are subcentimeter acute infarcts in the right cerebellum, mesial right temporal lobe, right temporal stem, right basal ganglia, and right frontoparietal subcortical white matter. Additionally, there is mildly restricted diffusion diffusely involving cortex laterally over the right cerebral convexity (right frontal, parietal, and temporal lobes as well as insula in the MCA territory). There is slight associated cortical T2 FLAIR hyperintensity mainly in the right parietal lobe without significant gyral swelling. There is a chronic infarct in the right basal ganglia with associated chronic blood products. There is also a moderate-sized chronic left parieto-occipital infarct. Scattered small T2 hyperintensities elsewhere in the cerebral white matter bilaterally are nonspecific but compatible with mild chronic small vessel ischemic disease. The ventricles are normal in size aside from slight ex vacuo dilatation of the body/frontal horn of the right lateral ventricle and occipital horn of the left lateral ventricle related to the chronic infarcts. No mass, midline shift, or extra-axial fluid collection is identified. Vascular: Major intracranial vascular flow voids are preserved. Skull and upper  cervical spine: Unremarkable bone marrow signal. Sinuses/Orbits: Unremarkable orbits. Mucous retention cyst or polyp in the right maxillary sinus. Mild scattered mucosal thickening in the paranasal sinuses elsewhere. Small bilateral mastoid effusions. Other: None. IMPRESSION: 1. Small acute infarcts in the right cerebellum and right cerebral hemisphere. 2. Mildly restricted diffusion involving the cortex more diffusely over the right cerebral convexity favored to reflect an acute MCA territory infarct over global hypoxic injury given unilaterality.  3. 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 Electronically Signed   By: Logan Bores M.D.   On: 12/27/2020 15:27   EEG adult  Result Date: 12/28/2020 Derek Jack, MD     12/28/2020  8:05 PM Routine EEG Report Jeremy Browning is a 60 y.o. male with a history of cardiac arrest who is undergoing an EEG to evaluate for seizures. Report: This EEG was acquired with electrodes placed according to the International 10-20 electrode system (including Fp1, Fp2, F3, F4, C3, C4, P3, P4, O1, O2, T3, T4, T5, T6, A1, A2, Fz, Cz, Pz). The following electrodes were missing or displaced: none. The occipital dominant rhythm was 3-5 Hz with intermittent focal slowing on the R and overriding faster frequencies in beta range throughout the recording. Background activity was nearly continuous with rare intervening periods of suppression lasting 1-2 sec. This activity was reactive to stimulation. No sleep architecture was identified. There were no interictal epileptiform discharges. There were no electrographic seizures identified. Photic stimulation and hyperventilation was not performed. Significant muscle artifact in the R>L FT leads secondary to patient clenching jaw and biting ET tube did not interfere with interpretation of the recording. Impression and clinical correlation: This EEG was obtained while sedated and is abnormal due to moderate diffuse slowing  and focal slowing over the right hemisphere indicating both global and R focal cerebral dysfunction. No electrographic seizures were identified. Su Monks, MD [facility] [phone] If 7pm- 7am, please page neurology on call as listed in [place].   ECHOCARDIOGRAM COMPLETE  Result Date: 12/27/2020    ECHOCARDIOGRAM REPORT   Patient Name:   Jeremy Browning Date of Exam: 12/27/2020 Medical Rec #:  [id number]      Height: Accession #:    [id number]     Weight:       277.3 lb Date of Birth:  Oct 19, 1960       BSA:          2.511 m Patient Age:    84 years       BP:           179/82 mmHg Patient Gender: M              HR:           66 bpm. 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                  Risk Factors:Hypertension and Diabetes. Heart failure.  Sonographer:     Sherrie Sport RDCS (AE) Referring Phys:  [id number] BRITTON L [place] Diagnosing Phys: Ida Rogue MD IMPRESSIONS  1. Left ventricular ejection fraction, by estimation, is 45 to 50%. The left ventricle has mildly decreased function. The left ventricle demonstrates global hypokinesis. The left ventricular internal cavity size was mildly to moderately dilated. There is mild left ventricular hypertrophy. Left ventricular diastolic parameters are consistent with Grade I diastolic dysfunction (impaired relaxation).  2. Right ventricular systolic function is normal. The right ventricular size is normal.  3. Left atrial size was mildly dilated. FINDINGS  Left Ventricle: Left ventricular ejection fraction, by estimation, is 45 to 50%. The left ventricle has mildly decreased function. The left ventricle demonstrates global hypokinesis. The left ventricular internal cavity size was mildly to moderately dilated.  There is mild left ventricular hypertrophy. Left ventricular diastolic parameters are consistent with Grade I diastolic dysfunction (impaired relaxation). Right Ventricle: The right ventricular size is normal. No increase in right ventricular wall thickness. Right ventricular systolic function is normal. Left Atrium: Left atrial size was mildly dilated. Right Atrium: Right atrial size was normal in size. Pericardium: There is no evidence of pericardial effusion. Mitral Valve: The mitral valve is normal in structure. No evidence of mitral valve regurgitation. No evidence of mitral valve stenosis. Tricuspid Valve: The tricuspid valve is normal in structure. Tricuspid valve regurgitation is not demonstrated. No evidence of tricuspid stenosis. Aortic Valve: The aortic valve was not well visualized. Aortic valve regurgitation is mild. No aortic stenosis is present. Aortic valve mean gradient measures 5.3 mmHg. Aortic valve peak gradient measures 10.5 mmHg. Aortic valve area, by VTI measures 1.91 cm. Pulmonic Valve: The pulmonic valve was normal in structure. Pulmonic valve regurgitation is not visualized. No evidence of pulmonic stenosis. Aorta: The aortic root is normal in size and structure. Venous: The inferior vena cava is normal in size with greater than 50% respiratory variability, suggesting right atrial pressure of 3 mmHg. IAS/Shunts: No atrial level shunt detected by color flow Doppler.  LEFT VENTRICLE PLAX 2D LVIDd:         6.41 cm  Diastology LVIDs:         4.61 cm  LV e' medial:    5.44 cm/s LV PW:         1.34 cm  LV E/e' medial:  18.9 LV IVS:        0.96 cm  LV e' lateral:   7.83 cm/s LVOT diam:     2.00 cm  LV E/e' lateral: 13.2 LV SV:         56 LV SV Index:   22 LVOT Area:     3.14 cm  RIGHT VENTRICLE RV Basal diam:  3.93 cm RV S prime:     16.40 cm/s TAPSE (M-mode): 4.1 cm LEFT ATRIUM           Index       RIGHT ATRIUM           Index LA diam:      4.80 cm 1.91 cm/m  RA Area:     20.40 cm LA Vol  (A2C): 92.3 ml 36.76 ml/m RA Volume:   54.00 ml  21.50 ml/m LA Vol (A4C): 69.3 ml 27.60 ml/m  AORTIC VALVE                    PULMONIC VALVE AV Area (Vmax):    1.83 cm     PV Vmax:        1.31 m/s AV Area (Vmean):   1.80 cm     PV Peak grad:   6.9 mmHg AV Area (VTI):     1.91 cm     RVOT Peak grad: 8 mmHg AV Vmax:           162.33 cm/s AV Vmean:          105.000 cm/s AV VTI:            0.294 m AV Peak Grad:      10.5 mmHg AV Mean Grad:  5.3 mmHg LVOT Vmax:         94.60 cm/s LVOT Vmean:        60.300 cm/s LVOT VTI:          0.179 m LVOT/AV VTI ratio: 0.61  AORTA Ao Root diam: 3.07 cm MITRAL VALVE                TRICUSPID VALVE MV Area (PHT): 3.17 cm     TR Peak grad:   18.0 mmHg MV Decel Time: 239 msec     TR Vmax:        212.00 cm/s MV E velocity: 103.00 cm/s MV A velocity: 117.00 cm/s  SHUNTS MV E/A ratio:  0.88         Systemic VTI:  0.18 m                             Systemic Diam: 2.00 cm Ida Rogue MD Electronically signed by Ida Rogue MD Signature Date/Time: 12/27/2020/3:00:49 PM    Final      Medications:   . sodium chloride    . sodium chloride    . ampicillin-sulbactam (UNASYN) IV 3 g (12/29/20 0820)  . feeding supplement (VITAL HIGH PROTEIN) 50 mL/hr at 12/29/20 0600  . propofol (DIPRIVAN) infusion 35 mcg/kg/min (12/29/20 0843)   . chlorhexidine gluconate (MEDLINE KIT)  15 mL Mouth Rinse BID  . Chlorhexidine Gluconate Cloth  6 each Topical Q0600  . docusate  100 mg Per Tube BID  . famotidine  20 mg Per Tube Daily  . feeding supplement (PROSource TF)  45 mL Per Tube TID  . free water  30 mL Per Tube Q4H  . heparin  5,000 Units Subcutaneous Q8H  . hydrALAZINE  25 mg Per Tube Q6H  . insulin aspart  0-15 Units Subcutaneous Q4H  . isosorbide dinitrate  10 mg Per Tube Q6H  . mouth rinse  15 mL Mouth Rinse 10 times per day  . polyethylene glycol  17 g Per Tube Daily  . sodium chloride flush  10-40 mL Intracatheter Q12H  . sodium chloride flush  3 mL Intravenous Q12H    sodium chloride, acetaminophen (TYLENOL) oral liquid 160 mg/5 mL, albuterol, docusate, fentaNYL (SUBLIMAZE) injection, hydrALAZINE, midazolam, polyethylene glycol, sodium chloride flush, sodium chloride flush  Assessment/ Plan:  Mr. Emigdio Wildeman is a 60 y.o. black male with diabetes mellitus type II, hypertension, congestive heart failure, who was admitted to [facility] on 12/25/2020 for Cardiogenic shock ([place]) [R57.0] Flash pulmonary edema ([place]) [J81.0] Cardiac arrest with successful resuscitation [facility]) [I46.9] Cardiopulmonary arrest with successful resuscitation ([place]) [I46.9]  1. Acute kidney injury with hyperkalemia: Proteinuria and hematuria on urinalysis.  Creatinine continues to improve. Suspect this may be close to his baseline.  Cardiac catheterization 5/7 Nonoliguric urine output.  No indication for dialysis.   2. Acute Respiratory Failure: requiring intubation and mechanical ventilation. Secondary to flash pulmonary edema from acute exacerbation of systolic and diastolic chronic congestive heart failure and cardiogenic shock from acute ischemic event.   3. Cardiogenic shock and possible septic shock. No longer on vasopressors.  - empiric antibiotics: unasyn  4. 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 Hemoglobin A1c 5.9%.   6. Anemia with kidney failure:    LOS: 4 Jeniya Flannigan 5/11/20228:45 AM

## 2020-12-30 DIAGNOSIS — J9601 Acute respiratory failure with hypoxia: Secondary | ICD-10-CM | POA: Diagnosis not present

## 2020-12-30 DIAGNOSIS — N179 Acute kidney failure, unspecified: Secondary | ICD-10-CM | POA: Diagnosis not present

## 2020-12-30 DIAGNOSIS — N189 Chronic kidney disease, unspecified: Secondary | ICD-10-CM | POA: Diagnosis not present

## 2020-12-30 DIAGNOSIS — Z515 Encounter for palliative care: Secondary | ICD-10-CM | POA: Diagnosis not present

## 2020-12-30 DIAGNOSIS — Z7189 Other specified counseling: Secondary | ICD-10-CM | POA: Diagnosis not present

## 2020-12-30 DIAGNOSIS — I469 Cardiac arrest, cause unspecified: Secondary | ICD-10-CM | POA: Diagnosis not present

## 2020-12-30 LAB — CBC
HCT: 31.3 % — ABNORMAL LOW (ref 39.0–52.0)
Hemoglobin: 9.8 g/dL — ABNORMAL LOW (ref 13.0–17.0)
MCH: 25.1 pg — ABNORMAL LOW (ref 26.0–34.0)
MCHC: 31.3 g/dL (ref 30.0–36.0)
MCV: 80.1 fL (ref 80.0–100.0)
Platelets: 203 10*3/uL (ref 150–400)
RBC: 3.91 MIL/uL — ABNORMAL LOW (ref 4.22–5.81)
RDW: 17.9 % — ABNORMAL HIGH (ref 11.5–15.5)
WBC: 9.1 10*3/uL (ref 4.0–10.5)
nRBC: 0 % (ref 0.0–0.2)

## 2020-12-30 LAB — BASIC METABOLIC PANEL
Anion gap: 9 (ref 5–15)
BUN: 40 mg/dL — ABNORMAL HIGH (ref 6–20)
CO2: 20 mmol/L — ABNORMAL LOW (ref 22–32)
Calcium: 8.2 mg/dL — ABNORMAL LOW (ref 8.9–10.3)
Chloride: 112 mmol/L — ABNORMAL HIGH (ref 98–111)
Creatinine, Ser: 1.72 mg/dL — ABNORMAL HIGH (ref 0.61–1.24)
GFR, Estimated: 45 mL/min — ABNORMAL LOW (ref >60)
Glucose, Bld: 117 mg/dL — ABNORMAL HIGH (ref 70–99)
Potassium: 4.3 mmol/L (ref 3.5–5.1)
Sodium: 141 mmol/L (ref 135–145)

## 2020-12-30 LAB — CULTURE, BLOOD (ROUTINE X 2)
Culture: NO GROWTH
Specimen Description: ADEQUATE

## 2020-12-30 LAB — GLUCOSE, CAPILLARY
Glucose-Capillary: 97 mg/dL (ref 70–99)
Glucose-Capillary: 94 mg/dL (ref 70–99)
Glucose-Capillary: 118 mg/dL — ABNORMAL HIGH (ref 70–99)
Glucose-Capillary: 115 mg/dL — ABNORMAL HIGH (ref 70–99)
Glucose-Capillary: 116 mg/dL — ABNORMAL HIGH (ref 70–99)
Glucose-Capillary: 112 mg/dL — ABNORMAL HIGH (ref 70–99)

## 2020-12-30 LAB — PHOSPHORUS: Phosphorus: 4.8 mg/dL — ABNORMAL HIGH (ref 2.5–4.6)

## 2020-12-30 LAB — MAGNESIUM: Magnesium: 2.6 mg/dL — ABNORMAL HIGH (ref 1.7–2.4)

## 2020-12-30 MED ORDER — NOREPINEPHRINE 16 MG/250ML-% IV SOLN
0.0000 ug/min | INTRAVENOUS | Status: DC
  Administered 2020-12-30: 3 ug/min via INTRAVENOUS
  Filled 2020-12-30: qty 250

## 2020-12-30 MED ORDER — LACTATED RINGERS IV BOLUS
1000.0000 mL | Freq: Once | INTRAVENOUS | Status: AC
  Administered 2020-12-30: 1000 mL via INTRAVENOUS

## 2020-12-30 MED ORDER — MIDAZOLAM HCL 2 MG/2ML IJ SOLN
2.0000 mg | Freq: Once | INTRAMUSCULAR | Status: AC
  Administered 2020-12-30: 2 mg via INTRAVENOUS
  Filled 2020-12-30: qty 2

## 2020-12-30 MED ORDER — PROSOURCE TF PO LIQD
90.0000 mL | Freq: Three times a day (TID) | ORAL | Status: DC
  Administered 2020-12-30 – 2021-01-03 (×12): 90 mL
  Filled 2020-12-30: qty 90

## 2020-12-30 MED ORDER — FENTANYL CITRATE (PF) 100 MCG/2ML IJ SOLN
50.0000 ug | Freq: Once | INTRAMUSCULAR | Status: AC
Start: 2020-12-30 — End: 2020-12-30
  Administered 2020-12-30: 50 ug via INTRAVENOUS
  Filled 2020-12-30: qty 2

## 2020-12-30 MED ORDER — VITAL HIGH PROTEIN PO LIQD
1000.0000 mL | ORAL | Status: DC
  Administered 2020-12-30 – 2021-01-02 (×5): 1000 mL

## 2020-12-30 MED ORDER — ENOXAPARIN SODIUM 60 MG/0.6ML IJ SOSY
0.5000 mg/kg | PREFILLED_SYRINGE | INTRAMUSCULAR | Status: DC
  Administered 2020-12-30 – 2021-01-09 (×11): 60 mg via SUBCUTANEOUS
  Filled 2020-12-30 (×13): qty 0.6

## 2020-12-30 NOTE — Progress Notes (Signed)
[facility]  ROUNDING NOTE   Subjective:   UOP 2167m.   Creatinine 1.72 (1.86) (1.82) (2.35) (2.53)   Objective:  Vital signs in last 24 hours:  Temp:  [98.1 F (36.7 C)-99.6 F (37.6 C)] 99.6 F (37.6 C) (05/12 0017) Pulse Rate:  [66-89] 81 (05/12 0700) Resp:  [11-33] 24 (05/12 0700) BP: (97-156)/(40-58) 135/47 (05/12 0700) SpO2:  [99 %-100 %] 100 % (05/12 0700) Arterial Line BP: (95-114)/(41-47) 114/47 (05/11 1400) FiO2 (%):  [28 %] 28 % (05/12 0808) Weight:  [121.6 kg] 121.6 kg (05/12 0450)  Weight change:  Filed Weights   12/27/20 0412 12/28/20 0431 12/30/20 0450  Weight: 125.8 kg 125.6 kg 121.6 kg    Intake/Output: I/O last 3 completed shifts: In: 2773.2 [I.V.:782.5; NG/GT:1590.7; IV PMLYYTKPTW:656]Out: 38127[Urine:3770]   Intake/Output this shift:  Total I/O In: 158.2 [I.V.:78.2; NG/GT:80] Out: -   Physical Exam: General: Critically ill  Head: ETT   Eyes: Eyes closed  Neck: trachea midline  Lungs:  PRVC FiO2 28%  Heart: Regular rate and rhythm  Abdomen:  Soft   Extremities:  + peripheral edema.  Neurologic: Intubated and sedated  Skin: No lesions  Access: Left femoral vascath 5/8    Basic Metabolic Panel: Recent Labs  Lab 12/26/20 0420 12/27/20 0407 12/28/20 0429 12/29/20 0420 12/30/20 0533  NA 135 136 138 141 141  K 5.4* 4.0 3.8 4.3 4.3  CL 108 111 112* 113* 112*  CO2 18* 22 19* 19* 20*  GLUCOSE 160* 114* 146* 124* 117*  BUN 27* 29* 28* 33* 40*  CREATININE 2.53* 2.35* 1.82* 1.86* 1.72*  CALCIUM 7.7* 7.8* 8.2* 8.3* 8.2*  MG 1.7 1.9 2.3 2.3 2.6*  PHOS 4.0 2.5 3.1 4.5 4.8*    Liver Function Tests: Recent Labs  Lab 12/25/20 1545 12/26/20 0420  AST 85* 73*  ALT 58* 46*  ALKPHOS 82 62  BILITOT 0.6 0.8  PROT 7.9 7.4  ALBUMIN 3.2* 3.0*   No results for input(s): LIPASE, AMYLASE in the last 168 hours. No results for input(s): AMMONIA in the last 168 hours.  CBC: Recent Labs  Lab 12/25/20 1451 12/26/20 0420  12/27/20 0407 12/28/20 0429 12/29/20 0420 12/30/20 0533  WBC 17.7* 13.2* 8.8 9.1 10.2 9.1  NEUTROABS 5.1  --   --   --  5.7  --   HGB 14.0 12.8* 10.7* 10.0* 10.3* 9.8*  HCT 46.6 40.7 32.8* 32.2* 32.5* 31.3*  MCV 83.4 79.3* 77.9* 78.7* 78.9* 80.1  PLT 171 243 155 173 203 203    Cardiac Enzymes: No results for input(s): CKTOTAL, CKMB, CKMBINDEX, TROPONINI in the last 168 hours.  BNP: Invalid input(s): POCBNP  CBG: Recent Labs  Lab 12/29/20 1555 12/29/20 1935 12/29/20 2333 12/30/20 0341 12/30/20 0728  GLUCAP 108* 101* 112* 97 94    Microbiology: Results for orders placed or performed during the hospital encounter of 12/25/20  Culture, blood (routine x 2)     Status: Abnormal   Collection Time: 12/25/20  2:55 PM   Specimen: BLOOD  Result Value Ref Range Status   Specimen Description   Final    BLOOD BLOOD RIGHT HAND Performed at [facility] [address] [place] [address]   Special Requests   Final    BOTTLES DRAWN AEROBIC AND ANAEROBIC Blood Culture adequate volume Performed at [facility] [address], [place] [address]   Culture  Setup Time   Final    GRAM POSITIVE  COCCI AEROBIC BOTTLE ONLY CRITICAL RESULT CALLED TO, READ BACK BY AND VERIFIED WITH: MORGAN CUNNIGHAN AT 6333 ON 12/26/20 BY SS    Culture (A)  Final    STAPHYLOCOCCUS HOMINIS THE SIGNIFICANCE OF ISOLATING THIS ORGANISM FROM A SINGLE SET OF BLOOD CULTURES WHEN MULTIPLE SETS ARE DRAWN IS UNCERTAIN. PLEASE NOTIFY THE MICROBIOLOGY DEPARTMENT WITHIN ONE WEEK IF SPECIATION AND SENSITIVITIES ARE REQUIRED. Performed at [facility], [place] [address], [place], [place] [address]    Report Status 12/28/2020 FINAL  Final  Blood Culture ID Panel (Reflexed)     Status: Abnormal   Collection Time: 12/25/20  2:55 PM  Result Value Ref Range Status   Enterococcus faecalis NOT DETECTED NOT DETECTED Final   Enterococcus Faecium NOT DETECTED NOT DETECTED Final    Listeria monocytogenes NOT DETECTED NOT DETECTED Final   Staphylococcus species DETECTED (A) NOT DETECTED Final    Comment: CRITICAL RESULT CALLED TO, READ BACK BY AND VERIFIED WITH: MORGAN CUNNINGHAN AT 1159 ON 12/26/20 BY SS    Staphylococcus aureus (BCID) NOT DETECTED NOT DETECTED Final   Staphylococcus epidermidis NOT DETECTED NOT DETECTED Final   Staphylococcus lugdunensis NOT DETECTED NOT DETECTED Final   Streptococcus species NOT DETECTED NOT DETECTED Final   Streptococcus agalactiae NOT DETECTED NOT DETECTED Final   Streptococcus pneumoniae NOT DETECTED NOT DETECTED Final   Streptococcus pyogenes NOT DETECTED NOT DETECTED Final   A.calcoaceticus-baumannii NOT DETECTED NOT DETECTED Final   Bacteroides fragilis NOT DETECTED NOT DETECTED Final   Enterobacterales NOT DETECTED NOT DETECTED Final   Enterobacter cloacae complex NOT DETECTED NOT DETECTED Final   Escherichia coli NOT DETECTED NOT DETECTED Final   Klebsiella aerogenes NOT DETECTED NOT DETECTED Final   Klebsiella oxytoca NOT DETECTED NOT DETECTED Final   Klebsiella pneumoniae NOT DETECTED NOT DETECTED Final   Proteus species NOT DETECTED NOT DETECTED Final   Salmonella species NOT DETECTED NOT DETECTED Final   Serratia marcescens NOT DETECTED NOT DETECTED Final   Haemophilus influenzae NOT DETECTED NOT DETECTED Final   Neisseria meningitidis NOT DETECTED NOT DETECTED Final   Pseudomonas aeruginosa NOT DETECTED NOT DETECTED Final   Stenotrophomonas maltophilia NOT DETECTED NOT DETECTED Final   Candida albicans NOT DETECTED NOT DETECTED Final   Candida auris NOT DETECTED NOT DETECTED Final   Candida glabrata NOT DETECTED NOT DETECTED Final   Candida krusei NOT DETECTED NOT DETECTED Final   Candida parapsilosis NOT DETECTED NOT DETECTED Final   Candida tropicalis NOT DETECTED NOT DETECTED Final   Cryptococcus neoformans/gattii NOT DETECTED NOT DETECTED Final    Comment: Performed at [facility], [address] [place],  [address]  Resp Panel by RT-PCR (Flu A&B, Covid) Nasopharyngeal Swab     Status: None   Collection Time: 12/25/20  5:51 PM   Specimen: Nasopharyngeal Swab; Nasopharyngeal(NP) swabs in vial transport medium  Result Value Ref Range Status   SARS Coronavirus 2 by RT PCR NEGATIVE NEGATIVE Final    Comment: (NOTE) SARS-CoV-2 target nucleic acids are NOT DETECTED.  The SARS-CoV-2 RNA is generally detectable in upper respiratory specimens during the acute phase of infection. The lowest concentration of SARS-CoV-2 viral copies this assay can detect is 138 copies/mL. A negative result does not preclude SARS-Cov-2 infection and should not be used as the sole basis for treatment or other patient management decisions. A negative result may occur with  improper specimen collection/handling, submission of specimen other than nasopharyngeal swab, presence of viral mutation(s) within the areas targeted by this  assay, and inadequate number of viral copies(<138 copies/mL). A negative result must be combined with clinical observations, patient history, and epidemiological information. The expected result is Negative.  Fact Sheet for Patients:  EntrepreneurPulse.com.au  Fact Sheet for Healthcare Providers:  IncredibleEmployment.be  This test is no t yet approved or cleared by the [place] FDA and  has been authorized for detection and/or diagnosis of SARS-CoV-2 by FDA under an Emergency Use Authorization (EUA). This EUA will remain  in effect (meaning this test can be used) for the duration of the COVID-19 declaration under Section 564(b)(1) of the Act, 21 U.S.C.section 360bbb-3(b)(1), unless the authorization is terminated  or revoked sooner.       Influenza A by PCR NEGATIVE NEGATIVE Final   Influenza B by PCR NEGATIVE NEGATIVE Final    Comment: (NOTE) The Xpert Xpress SARS-CoV-2/FLU/RSV plus assay is intended as an aid in the diagnosis  of influenza from Nasopharyngeal swab specimens and should not be used as a sole basis for treatment. Nasal washings and aspirates are unacceptable for Xpert Xpress SARS-CoV-2/FLU/RSV testing.  Fact Sheet for Patients: EntrepreneurPulse.com.au  Fact Sheet for Healthcare Providers: IncredibleEmployment.be  This test is not yet approved or cleared by the [place] FDA and has been authorized for detection and/or diagnosis of SARS-CoV-2 by FDA under an Emergency Use Authorization (EUA). This EUA will remain in effect (meaning this test can be used) for the duration of the COVID-19 declaration under Section 564(b)(1) of the Act, 21 U.S.C. section 360bbb-3(b)(1), unless the authorization is terminated or revoked.  Performed at [facility], [address] [place], [place] [address]   MRSA PCR Screening     Status: None   Collection Time: 12/25/20  6:39 PM   Specimen: Nasopharyngeal  Result Value Ref Range Status   MRSA by PCR NEGATIVE NEGATIVE Final    Comment:        The GeneXpert MRSA Assay (FDA approved for NASAL specimens only), is one component of a comprehensive MRSA colonization surveillance program. It is not intended to diagnose MRSA infection nor to guide or monitor treatment for MRSA infections. Performed at [facility], [address] [place], [place] [address]   Culture, blood (routine x 2)     Status: None   Collection Time: 12/25/20  6:43 PM   Specimen: BLOOD  Result Value Ref Range Status   Specimen Description BLOOD Blood Culture adequate volume  Final   Special Requests   Final    BOTTLES DRAWN AEROBIC AND ANAEROBIC LEFT ANTECUBITAL   Culture   Final    NO GROWTH 5 DAYS Performed at [facility], [address] [place], [place] [address]    Report Status 12/30/2020 FINAL  Final  Culture, Respiratory w Gram Stain     Status: None   Collection Time: 12/26/20  6:27 AM   Specimen:  Tracheal Aspirate; Respiratory  Result Value Ref Range Status   Specimen Description   Final    TRACHEAL ASPIRATE Performed at [facility], [address] [place], [place] [address]    Special Requests   Final    NONE Performed at [facility], [address] [place], [place] [address]    Gram Stain   Final    FEW WBC PRESENT,BOTH PMN AND MONONUCLEAR RARE GRAM POSITIVE COCCI IN PAIRS    Culture   Final    RARE Normal respiratory flora-no Staph aureus or Pseudomonas seen Performed at [facility], [place]  [address], [place], [place] [address]    Report Status 12/28/2020 FINAL  Final    Coagulation Studies: No results for input(s): LABPROT, INR in the last 72 hours.  Urinalysis: No results for input(s): COLORURINE, LABSPEC, PHURINE, GLUCOSEU, HGBUR, BILIRUBINUR, KETONESUR, PROTEINUR, UROBILINOGEN, NITRITE, LEUKOCYTESUR in the last 72 hours.  Invalid input(s): APPERANCEUR    Imaging: CT HEAD WO CONTRAST  Result Date: 12/28/2020 CLINICAL DATA:  Neuro deficit. Acute stroke suspected. Hypertensive. EXAM: CT HEAD WITHOUT CONTRAST TECHNIQUE: Contiguous axial images were obtained from the base of the skull through the vertex without intravenous contrast. COMPARISON:  MR on 12/27/2020 FINDINGS: Brain: There is encephalomalacia in the LEFT parieto-occipital lobe, consistent with remote infarct. Remote lacunar infarct identified within the RIGHT basal ganglia. There is central and cortical atrophy. There is no intra or extra-axial fluid collection or mass lesion. The basilar cisterns and ventricles have a normal appearance. There is no CT evidence for acute infarction or hemorrhage. Vascular: There is atherosclerotic calcification of the internal carotid arteries. A hyperdense vessels. Skull: Normal. Negative for fracture or focal lesion. Sinuses/Orbits: Orbits are unremarkable. Small amounts of fluid and/or mucosal thickening within the paranasal sinuses. Small RIGHT  mastoid effusion. Other: Patient is intubated. Study quality is degraded by patient motion artifact. IMPRESSION: 1. 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 2.  No evidence for acute  abnormality. Electronically Signed   By: Nolon Nations M.D.   On: 12/28/2020 12:07   EEG adult  Result Date: 12/28/2020 Derek Jack, MD     12/28/2020  8:05 PM Routine EEG Report Sheron Robin is a 60 y.o. male with a history of cardiac arrest who is undergoing an EEG to evaluate for seizures. Report: This EEG was acquired with electrodes placed according to the International 10-20 electrode system (including Fp1, Fp2, F3, F4, C3, C4, P3, P4, O1, O2, T3, T4, T5, T6, A1, A2, Fz, Cz, Pz). The following electrodes were missing or displaced: none. The occipital dominant rhythm was 3-5 Hz with intermittent focal slowing on the R and overriding faster frequencies in beta range throughout the recording. Background activity was nearly continuous with rare intervening periods of suppression lasting 1-2 sec. This activity was reactive to stimulation. No sleep architecture was identified. There were no interictal epileptiform discharges. There were no electrographic seizures identified. Photic stimulation and hyperventilation was not performed. Significant muscle artifact in the R>L FT leads secondary to patient clenching jaw and biting ET tube did not interfere with interpretation of the recording. Impression and clinical correlation: This EEG was obtained while sedated and is abnormal due to moderate diffuse slowing and focal slowing over the right hemisphere indicating both global and R focal cerebral dysfunction. No electrographic seizures were identified. Su Monks, MD [facility] [phone] If 7pm- 7am, please page neurology on call as listed in [place].     Medications:   . sodium chloride    . sodium chloride    . propofol (DIPRIVAN) infusion 55 mcg/kg/min (12/30/20  0800)   . chlorhexidine gluconate (MEDLINE KIT)  15 mL Mouth Rinse BID  . Chlorhexidine Gluconate Cloth  6 each Topical Q0600  . docusate  100 mg Per Tube BID  . famotidine  20 mg Per Tube Daily  . feeding supplement (PROSource TF)  45 mL Per Tube QID  . feeding supplement (VITAL HIGH PROTEIN)  1,000 mL Per Tube Q24H  . free water  30 mL Per Tube Q4H  . heparin  5,000 Units Subcutaneous Q8H  .  hydrALAZINE  25 mg Per Tube Q6H  . insulin aspart  0-15 Units Subcutaneous Q4H  . isosorbide dinitrate  10 mg Per Tube Q6H  . mouth rinse  15 mL Mouth Rinse 10 times per day  . polyethylene glycol  17 g Per Tube Daily  . sodium chloride flush  10-40 mL Intracatheter Q12H  . sodium chloride flush  3 mL Intravenous Q12H   sodium chloride, acetaminophen (TYLENOL) oral liquid 160 mg/5 mL, albuterol, docusate, fentaNYL (SUBLIMAZE) injection, hydrALAZINE, midazolam, polyethylene glycol, sodium chloride flush, sodium chloride flush  Assessment/ Plan:  Mr. Jeremy Browning is a 60 y.o. black male with diabetes mellitus type II, hypertension, congestive heart failure, who was admitted to [facility] on 12/25/2020 for Cardiogenic shock ([place]) [R57.0] Flash pulmonary edema ([place]) [J81.0] Cardiac arrest with successful resuscitation [facility]) [I46.9] Cardiopulmonary arrest with successful resuscitation ([place]) [I46.9]  1. Acute kidney injury with hyperkalemia: Proteinuria and hematuria on urinalysis.  Creatinine continues to improve. Chronic kidney disease with unknown baseline creatinine.  Cardiac catheterization 5/7. ATN from hypotensive episode,  Nonoliguric urine output.  No indication for dialysis.  - Continue free water with tube feeds  2. Acute Respiratory Failure: requiring intubation and mechanical ventilation. Secondary to flash pulmonary edema from acute exacerbation of systolic and diastolic chronic congestive heart failure and cardiogenic shock from acute ischemic event.   3. Cardiogenic shock and possible  septic shock. No longer on vasopressors.  - empiric antibiotics: unasyn  4. 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 Hemoglobin A1c 5.9%.   6. Anemia with kidney failure: Hemoglobin 9.8. Normocytic.    LOS: 5 Suhayb Anzalone 5/12/20228:44 AM

## 2020-12-30 NOTE — Progress Notes (Signed)
PHARMACY CONSULT NOTE - FOLLOW UP  Pharmacy Consult for Electrolyte Monitoring and Replacement   Recent Labs: Potassium (mmol/L)  Date Value  12/30/2020 4.3   Magnesium (mg/dL)  Date Value  99/24/2683 2.6 (H)   Calcium (mg/dL)  Date Value  41/96/2229 8.2 (L)   Albumin (g/dL)  Date Value  79/89/2119 3.0 (L)   Phosphorus (mg/dL)  Date Value  41/74/0814 4.8 (H)   Sodium (mmol/L)  Date Value  12/30/2020 141   Corrected calcium = 9.0 mg/dL  Assessment:  60 year old male with obesity, diabetes mellitus, hypertension, and combined diastolic and systolic heart failure who presented to the emergency department yesterday in respiratory distress admitted following witnessed cardiac arrest. Pt is S/P CPR intubation. Pt with AKI on suspected CKD insetting of shock; nephrology following. Pharmacy consulted for electrolyte monitoring and replacement.  Lasix drip started 5/7 evening and discontinued 5/8. PROSource 79mL QID Vital High Protein @40ml /hr Free water 38mL q4h  Goal of Therapy:  Electrolytes WNL  Plan:   Phos 4.8 - will continue to monitor for now  No replacement indicated at this time  Will follow with AM labs and replace as needed.  31m, PharmD Pharmacy Resident  12/30/2020 6:43 AM

## 2020-12-30 NOTE — Progress Notes (Signed)
NAME:  Jeremy Browning, MRN:  [id number], DOB:  Jul 01, 1961, LOS: 5 ADMISSION DATE:  12/25/2020  60 yo M presenting to [facility] ED from home via EMS with respiratory distress that had been progressively worsening over the course of 12/25/20. Per documentation family stated the patient was in his normal state of health until this morning when he began feeling unwell. He then called out stating he could not breathe. EMS found the patient hypoxic and in distress with SpO2 in the 70's on room air. Patient was place on CPAP, arriving to the ED unresponsive and pulseless. ED course: Upon arrival to ED patient was defibrillated x 1 and received CPR before achieving ROSC. Initial EKG suggestive of global ischemia, second EKG had possible LBBB vs IVCD. Patient was emergently intubated requiring mechanical ventilation. Per ED documentation STEMI team activated in the setting of a witnessed shockable cardiac arrest with clinical exam highly suggestive of pulmonary edema with pink frothy sputum throughout ET tubing.  Pertinent Medical History  Obesity Hypertension Diabetes mellitus Chronic renal insufficiency Combined systolic and diastolic heart failure  Significant Hospital Events: Including procedures, antibiotic start and stop dates in addition to other pertinent events   Cardiac catheterization on presentation with normal coronaries, good cardiac output, elevated pulmonary capillary wedge pressure.  5/7 severe resp failure 5/8 remains on Vent 5/9 remains on vent , multiorgan failure MRI shows brain injury 5/10 remains on vent, multiorgan failure, brother updated 5/11 severe resp failure  MICRO DATA 5/7 STAPHYLOCOCCUS HOMINIS 1/4 cultures MRSA NEG 5/8 sputum cx pending  COVID  And INF A/B NEG   Antibiotics Given (last 72 hours)    Date/Time Action Medication Dose Rate   12/27/20 0831 New Bag/Given   ampicillin-sulbactam (UNASYN) 1.5 g in sodium chloride 0.9 % 100 mL IVPB 1.5 g 200  mL/hr   12/27/20 0832 New Bag/Given   doxycycline (VIBRAMYCIN) 100 mg in sodium chloride 0.9 % 250 mL IVPB 100 mg 125 mL/hr   12/27/20 1510 New Bag/Given   ampicillin-sulbactam (UNASYN) 1.5 g in sodium chloride 0.9 % 100 mL IVPB 1.5 g 200 mL/hr   12/27/20 1935 New Bag/Given   ampicillin-sulbactam (UNASYN) 1.5 g in sodium chloride 0.9 % 100 mL IVPB 1.5 g 200 mL/hr   12/28/20 0222 New Bag/Given   ampicillin-sulbactam (UNASYN) 1.5 g in sodium chloride 0.9 % 100 mL IVPB 1.5 g 200 mL/hr   12/28/20 0751 New Bag/Given   ampicillin-sulbactam (UNASYN) 1.5 g in sodium chloride 0.9 % 100 mL IVPB 1.5 g 200 mL/hr   12/28/20 1522 New Bag/Given   Ampicillin-Sulbactam (UNASYN) 3 g in sodium chloride 0.9 % 100 mL IVPB 3 g 200 mL/hr   12/28/20 2058 New Bag/Given   Ampicillin-Sulbactam (UNASYN) 3 g in sodium chloride 0.9 % 100 mL IVPB 3 g 200 mL/hr   12/29/20 0245 New Bag/Given   Ampicillin-Sulbactam (UNASYN) 3 g in sodium chloride 0.9 % 100 mL IVPB 3 g 200 mL/hr   12/29/20 0820 New Bag/Given   Ampicillin-Sulbactam (UNASYN) 3 g in sodium chloride 0.9 % 100 mL IVPB 3 g 200 mL/hr   12/29/20 1413 New Bag/Given   Ampicillin-Sulbactam (UNASYN) 3 g in sodium chloride 0.9 % 100 mL IVPB 3 g 200 mL/hr   12/29/20 1938 New Bag/Given   Ampicillin-Sulbactam (UNASYN) 3 g in sodium chloride 0.9 % 100 mL IVPB 3 g 200 mL/hr         Interim History / Subjective:  Remains on vent Remains critically ill Severe brain damage +neurogenic fevers  Objective   Blood pressure (!) 122/44, pulse 81, temperature 99.6 F (37.6 C), temperature source Axillary, resp. rate 11, height 6' (1.829 m), weight 121.6 kg, SpO2 100 %.    Vent Mode: PRVC FiO2 (%):  [28 %] 28 % Set Rate:  [16 bmp] 16 bmp Vt Set:  [550 mL] 550 mL PEEP:  [5 cmH20] 5 cmH20   Intake/Output Summary (Last 24 hours) at 12/30/2020 0707 Last data filed at 12/30/2020 0600 Gross per 24 hour  Intake 1855.83 ml  Output 2160 ml  Net -304.17 ml   Filed  Weights   12/27/20 0412 12/28/20 0431 12/30/20 0450  Weight: 125.8 kg 125.6 kg 121.6 kg      REVIEW OF SYSTEMS  PATIENT IS UNABLE TO PROVIDE COMPLETE REVIEW OF SYSTEMS DUE TO SEVERE CRITICAL ILLNESS AND TOXIC METABOLIC ENCEPHALOPATHY   PHYSICAL EXAMINATION:  GENERAL:critically ill appearing, +resp distress HEAD: Normocephalic, atraumatic.  EYES: Pupils equal, round, reactive to light.  No scleral icterus.  MOUTH: Moist mucosal membrane. NECK: Supple. PULMONARY: +rhonchi, +wheezing CARDIOVASCULAR: S1 and S2. Regular rate and rhythm. No murmurs, rubs, or gallops.  GASTROINTESTINAL: Soft, nontender, -distended. Positive bowel sounds.  MUSCULOSKELETAL: No swelling, clubbing, or edema.  NEUROLOGIC: obtunded SKIN:intact,warm,dry   Labs/imaging that I havepersonally reviewed  (right click and "Reselect all SmartList Selections" daily)       ASSESSMENT AND PLAN SYNOPSIS   60 yo morbidly obese AAM with acute and severe cardiac arrest NSTEMI with acute severe systolic cardiac failure with signs and symptoms of brain damage  PATIENT IS NOT IMPROVING, PATIENT WITH MULTIORGAN FAILURE AND VERY POOR CHANCE OF MEANINGFUL RECDOVER    Severe ACUTE Hypoxic and Hypercapnic Respiratory Failure -continue Mechanical Ventilator support -continue Bronchodilator Therapy -Wean Fio2 and PEEP as tolerated -VAP/VENT bundle implementation Unable to wean   CARDIAC FAILURE-acute combined systolic/diastolic dysfunction -oxygen as needed -Lasix as tolerated -follow up cardiac enzymes as indicated   CARDIAC ICU monitoring   ACUTE KIDNEY INJURY/Renal Failure -continue Foley Catheter-assess need -Avoid nephrotoxic agents -Follow urine output, BMP -Ensure adequate renal perfusion, optimize oxygenation -Renal dose medications   NEUROLOGY Acute toxic metabolic encephalopathy, need for sedation Goal RASS -2 to -3 Signs and symptoms of brain damage   INFECTIOUS DISEASE -continue  antibiotics as prescribed -follow up cultures   ENDO - ICU hypoglycemic\Hyperglycemia protocol -check FSBS per protocol   GI GI PROPHYLAXIS as indicated  NUTRITIONAL STATUS DIET-->TF's as tolerated Constipation protocol as indicated   ELECTROLYTES -follow labs as needed -replace as needed -pharmacy consultation and following    Best practice (right click and "Reselect all SmartList Selections" daily)  Diet:NPO Pain/Anxiety/Delirium protocol (if indicated):Yes(RASS goal -1,-2) VAP protocol (if indicated):Yes DVT prophylaxis:Subcutaneous Heparin GI prophylaxis:H2B Glucose control:SSIYes Central venous access:Yes, and it is still needed Arterial line:Yes, and it is still needed Foley:Yes, and it is still needed Mobility:bed rest PT consulted:No Code Status:full code Disposition:ICU    Labs   CBC: Recent Labs  Lab 12/25/20 1451 12/26/20 0420 12/27/20 0407 12/28/20 0429 12/29/20 0420 12/30/20 0533  WBC 17.7* 13.2* 8.8 9.1 10.2 9.1  NEUTROABS 5.1  --   --   --  5.7  --   HGB 14.0 12.8* 10.7* 10.0* 10.3* 9.8*  HCT 46.6 40.7 32.8* 32.2* 32.5* 31.3*  MCV 83.4 79.3* 77.9* 78.7* 78.9* 80.1  PLT 171 243 155 173 203 203    Basic Metabolic Panel: Recent Labs  Lab 12/26/20 0420 12/27/20 0407 12/28/20 0429 12/29/20 0420 12/30/20 0533  NA 135 136 138 141 141  K 5.4* 4.0 3.8 4.3 4.3  CL 108 111 112* 113* 112*  CO2 18* 22 19* 19* 20*  GLUCOSE 160* 114* 146* 124* 117*  BUN 27* 29* 28* 33* 40*  CREATININE 2.53* 2.35* 1.82* 1.86* 1.72*  CALCIUM 7.7* 7.8* 8.2* 8.3* 8.2*  MG 1.7 1.9 2.3 2.3 2.6*  PHOS 4.0 2.5 3.1 4.5 4.8*   GFR: Estimated Creatinine Clearance: 61.5 mL/min (A) (by C-G formula based on SCr of 1.72 mg/dL (H)). Recent Labs  Lab 12/25/20 1451 12/25/20 1545 12/25/20 1843 12/26/20 0420 12/27/20 0407 12/28/20 0429 12/29/20 0420 12/30/20 0533  PROCALCITON  --  <0.10  --  33.57 23.40  --   --   --   WBC 17.7*  --   --   13.2* 8.8 9.1 10.2 9.1  LATICACIDVEN 7.8*  --  1.9  --   --   --   --   --     Liver Function Tests: Recent Labs  Lab 12/25/20 1545 12/26/20 0420  AST 85* 73*  ALT 58* 46*  ALKPHOS 82 62  BILITOT 0.6 0.8  PROT 7.9 7.4  ALBUMIN 3.2* 3.0*   No results for input(s): LIPASE, AMYLASE in the last 168 hours. No results for input(s): AMMONIA in the last 168 hours.  ABG    Component Value Date/Time   PHART 7.37 12/29/2020 1032   PCO2ART 35 12/29/2020 1032   PO2ART 81 (L) 12/29/2020 1032   HCO3 20.2 12/29/2020 1032   ACIDBASEDEF 4.5 (H) 12/29/2020 1032   O2SAT 95.5 12/29/2020 1032     Coagulation Profile: Recent Labs  Lab 12/25/20 1451  INR 1.2    Cardiac Enzymes: No results for input(s): CKTOTAL, CKMB, CKMBINDEX, TROPONINI in the last 168 hours.  HbA1C: Hgb A1c MFr Bld  Date/Time Value Ref Range Status  12/25/2020 04:46 PM 5.9 (H) 4.8 - 5.6 % Final    Comment:    (NOTE) Pre diabetes:          5.7%-6.4%  Diabetes:              >6.4%  Glycemic control for   <7.0% adults with diabetes     CBG: Recent Labs  Lab 12/29/20 1121 12/29/20 1555 12/29/20 1935 12/29/20 2333 12/30/20 0341  GLUCAP 119* 108* 101* 112* 97    Allergies Not on File     DVT/GI PRX  assessed I Assessed the need for Labs I Assessed the need for Foley I Assessed the need for Central Venous Line Family Discussion when available I Assessed the need for Mobilization I made an Assessment of medications to be adjusted accordingly Safety Risk assessment completed  CASE DISCUSSED IN MULTIDISCIPLINARY ROUNDS WITH ICU TEAM     Critical Care Time devoted to patient care services described in this note is 55 minutes.  Critical care was necessary to treat or prevent imminent or life-threatening deterioration. Overall, patient is critically ill, prognosis is guarded.  Patient with Multiorgan failure and at high risk for cardiac arrest and death.    Lucie Leather, M.D.  [facility]  Pulmonary & Critical Care Medicine  Medical Director [facility] Medical Director [facility] Cardio-Pulmonary Department

## 2020-12-30 NOTE — Progress Notes (Signed)
Palliative: Jeremy Browning, Jeremy Browning, is lying quietly in bed.  He is intubated/ventilated and sedated.  Nursing staff is at bedside attending to his needs.  There is no family at bedside at this time.  We talked about Jeremy Browning's acute and chronic health concerns.  We also talked about weaning trials.  Nursing staff shares that Jeremy Browning frequently has spontaneous cough, but no gag when suctioned or with Jeremy Browning.  They share that he will frequently become dyssynchronous with ventilator, therefore no SBT's.  Call to brother, Jeremy Browning, no answer, left voicemail message. Return call to Jeremy Browning.  We talk in detail about respiratory status, ventilator support.  We talk about "treat the treatable, but allow a natural death".  Jeremy Browning shares that he would want to give Jeremy Browning at least 7 days as FULL scope/CODE.  He shares that he will continue to hold discussions with his siblings. Support provided.   Conference with attending, bedside nursing staff, transition of care team related to patient condition, needs, goals of care. PMT to follow.   Plan:   Continue FULL SCOPE/FULL CODE for at least 7 days.  Time for outcomes.   35 minutes  Jeremy Carmel, NP  Palliative medicine team Team phone [phone] Greater than 50% of this time was spent counseling and coordinating care related to the above assessment and plan.

## 2020-12-30 NOTE — Progress Notes (Signed)
Neurology Progress Note  Patient ID: 60 yo man with hx obesity, diabetes, hypertension, combined systolic and diastolic heart failure presented to ED on May 7 in respiratory distress c/b NSTEMI and subsequently went into cardiac arrest in ED.  Neurology is consulted for prognostication after cardiac arrest  Interval events: - NAEON - Patient is back on propofol and required multiple prns today of fentanyl and versed for vent mgmt - GOC discussions continue; palliative care is involved  Exam: Vitals:   12/30/20 1600 12/30/20 1800  BP: (!) 103/41 (!) 115/41  Pulse: 70 68  Resp: (!) 23 (!) 21  Temp: 99.6 F (37.6 C)   SpO2: 99% 100%   Physical Exam Gen: obtunded HEENT: Atraumatic, normocephalic Resp: CTAB, no w/r/r CV: RRR, no m/g/r Abd: soft/NT/ND Extrem: Nml bulk; no cyanosis, clubbing, or edema.  Pt examined on sedation today, exam unchanged  Neuro: *MS: obtunded, occasionally stirs briefly in response to noxious stimuli *Speech: intubated, no attempt to speak *CN: PERRL, (+) corneals, oculocephalics, gag, cough *Motor & sensory: no spontaneous movement or movement to command of any extremity. No response to noxious stimuli in BUE. Withdrawal to noxious stimuli in both RLE and LLE.  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 No electrographic seizures were identified.  Impression: 60 yo man w/ hx obesity, DM2, HTN, systolic and diastolic HF p/w  respiratory distress in setting of NSTEMI with acute severe systolic heart failure now s/p cardiac arrest 5/7 with signs and symptoms of brain damage. Did not undergo TTM. Off sedation his exam has at times shows reactive pupils, intact brainstem reflees, and occasional withdrawal to noxious stimuli to either foot. His MRI does show e/o hypoxic ischemic damage (see above) and EEG corroborates severe global cerebral dysfunction with superimposed R focal dysfunction. I agree that GOC discussions are paramount at this time. The chance of him making a full recovery is diminishingly small. He would likely require trach/peg to survive and would require full care after that. I suggested that his family consider what Amer would have wanted and if he would have considered that a desired outcome or a meaningful life. They will continue family discussions and update myself and Dr. Belia Heman with decisions or questions.  Recommendations: 1) Continue GOC discussions 2) Defer MRA H&N for now, would not change mgmt   Will continue to follow.    Bing Neighbors, MD [facility] [phone]  If 7pm- 7am, please page neurology on call as listed in AMION.

## 2020-12-30 NOTE — TOC Initial Note (Signed)
Transition of Care [facility]) - Initial/Assessment Note    Patient Details  Name: Jeremy Browning MRN: [id number] Date of Birth: [birth year]  [facility] of Care [facility] CM/SW Contact:    Marina Goodell Phone Number: [phone] 12/30/2020, 12:00 PM  Clinical Narrative:                  Patient presents to [facility] due to respiratory distress.  Patient is from home. Patient's Jeremy, Browning [facility]) [phone] is the main contact.  Secondary contact is Jeremy Browning, [phone].   Palliative care has been consulted and they have spoke about goals of care with Jeremy Browning, he stated understanding that he will need to make goals of care decisions soon.  MRI shows brain injury, patient remains on vent, and is critical condition.  Expected Discharge Plan: Skilled Nursing Facility Barriers to Discharge: Continued Medical Work up   Patient Goals and CMS Choice        Expected Discharge Plan and Services Expected Discharge Plan: Skilled Nursing Facility In-house Referral: Clinical Social Work   Post Acute Care Choice: Skilled Nursing Facility                                        Prior Living Arrangements/Services   Lives with:: Siblings Jeremy Browning, Jeremy Browning (Brother)   [phone]) Patient language and need for interpreter reviewed:: Yes        Need for Family Participation in Patient Care: Yes (Comment) Care giver support system in place?: Yes (comment)   Criminal Activity/Legal Involvement Pertinent to Current Situation/Hospitalization: No - Comment as needed  Activities of Daily Living      Permission Sought/Granted Permission sought to share information with : Family Supports    Share Information with NAME: Jeremy, Browning [facility])   [phone]           Emotional Assessment Appearance:: Appears older than stated age Attitude/Demeanor/Rapport: Unable to Assess Affect (typically observed): Unable to Assess     Psych Involvement: No (comment)  Admission  diagnosis:  Cardiogenic shock (HCC) [R57.0] Flash pulmonary edema (HCC) [J81.0] Cardiac arrest with successful resuscitation [facility]) [I46.9] Cardiopulmonary arrest with successful resuscitation [facility]) [I46.9] Patient Active Problem List   Diagnosis Date Noted  . Severe hypoxic-ischemic encephalopathy   . HFrEF (heart failure with reduced ejection fraction) (HCC)   . Cardiogenic shock (HCC) 12/25/2020  . Cardiopulmonary arrest with successful resuscitation (HCC) 12/25/2020  . Acute kidney injury superimposed on CKD (HCC) 12/25/2020  . Transaminitis 12/25/2020  . Second degree heart block 12/25/2020  . Lactic acidosis 12/25/2020  . Aspiration pneumonia (HCC) 12/25/2020  . Cardiac arrest with successful resuscitation (HCC)   . Flash pulmonary edema (HCC)   . Acute respiratory failure with hypoxia and hypercapnia (HCC)    PCP:  No primary care provider on file. Pharmacy:   [facility] [address] [place] - [address] [place] [address] Phone: [phone] Fax: [phone]     Social Determinants of Health (SDOH) Interventions    Readmission Risk Interventions No flowsheet data found.

## 2020-12-30 NOTE — Progress Notes (Signed)
Nutrition Brief Follow Up Note   Propofol increase  INTERVENTION:   Change to Vital HP @25ml /hr + Pro-Source TF 2ml TID via tube   Propofol 41.20ml/hr- provides 1096kcal/day   Free water flushes 48ml q4 hours to maintain tube patency   Regimen provides 1936kcal/day, 119g/day protein and 673ml/day free water   Estimated Nutritional Needs:   Kcal:  1384-1761kcal/day  Protein:  >125g/day  Fluid:  2.1-2.4L/day  87m MS, RD, LDN Please refer to [facility] for RD and/or RD on-call/weekend/after hours pager

## 2020-12-30 NOTE — Plan of Care (Signed)
Neuro: stable on propofol, requiring multiple PRNs today for comfort and ventilator compliance Resp: often dyssynchronous with the vent, coughing/bucking/tachypneic throughout the day, moderate secretions CV: afebrile, vital signs fairly stable, hypotensive this evening-1L LR bolus given- BP improving GIGU: foley in place, tolerating tube feedings well, no BM Skin: clean dry and intact with scattered skin tears covered with foam Social: Update given to sister in law and brother over the phone. Brother has COVID and is Nature conservation officer, sister in law tested negative this AM and is living in a different area of the house for now, they would both like to visit as soon as possible. AC was contacted today for guidelines, awaiting a response.    Problem: Clinical Measurements: Goal: Ability to maintain clinical measurements within normal limits will improve Outcome: Not Progressing Goal: Will remain free from infection Outcome: Not Progressing Goal: Diagnostic test results will improve Outcome: Not Progressing Goal: Respiratory complications will improve Outcome: Not Progressing Goal: Cardiovascular complication will be avoided Outcome: Not Progressing   Problem: Nutrition: Goal: Adequate nutrition will be maintained Outcome: Not Progressing   Problem: Elimination: Goal: Will not experience complications related to bowel motility Outcome: Not Progressing Goal: Will not experience complications related to urinary retention Outcome: Not Progressing   Problem: Pain Managment: Goal: General experience of comfort will improve Outcome: Not Progressing   Problem: Safety: Goal: Ability to remain free from injury will improve Outcome: Not Progressing   Problem: Skin Integrity: Goal: Risk for impaired skin integrity will decrease Outcome: Not Progressing   Problem: Activity: Goal: Ability to tolerate increased activity will improve Outcome: Not Progressing   Problem: Respiratory: Goal:  Ability to maintain a clear airway and adequate ventilation will improve Outcome: Not Progressing   Problem: Role Relationship: Goal: Method of communication will improve Outcome: Not Progressing

## 2020-12-31 ENCOUNTER — Inpatient Hospital Stay: Payer: Medicaid Other

## 2020-12-31 DIAGNOSIS — N170 Acute kidney failure with tubular necrosis: Secondary | ICD-10-CM | POA: Diagnosis not present

## 2020-12-31 DIAGNOSIS — J810 Acute pulmonary edema: Secondary | ICD-10-CM | POA: Diagnosis not present

## 2020-12-31 DIAGNOSIS — R0602 Shortness of breath: Secondary | ICD-10-CM

## 2020-12-31 DIAGNOSIS — I469 Cardiac arrest, cause unspecified: Secondary | ICD-10-CM | POA: Diagnosis not present

## 2020-12-31 LAB — GLUCOSE, CAPILLARY
Glucose-Capillary: 113 mg/dL — ABNORMAL HIGH (ref 70–99)
Glucose-Capillary: 123 mg/dL — ABNORMAL HIGH (ref 70–99)
Glucose-Capillary: 119 mg/dL — ABNORMAL HIGH (ref 70–99)
Glucose-Capillary: 93 mg/dL (ref 70–99)
Glucose-Capillary: 99 mg/dL (ref 70–99)
Glucose-Capillary: 91 mg/dL (ref 70–99)

## 2020-12-31 LAB — BASIC METABOLIC PANEL
Anion gap: 6 (ref 5–15)
BUN: 43 mg/dL — ABNORMAL HIGH (ref 6–20)
CO2: 20 mmol/L — ABNORMAL LOW (ref 22–32)
Calcium: 8.2 mg/dL — ABNORMAL LOW (ref 8.9–10.3)
Chloride: 116 mmol/L — ABNORMAL HIGH (ref 98–111)
Creatinine, Ser: 1.56 mg/dL — ABNORMAL HIGH (ref 0.61–1.24)
GFR, Estimated: 51 mL/min — ABNORMAL LOW (ref >60)
Glucose, Bld: 131 mg/dL — ABNORMAL HIGH (ref 70–99)
Potassium: 4.3 mmol/L (ref 3.5–5.1)
Sodium: 142 mmol/L (ref 135–145)

## 2020-12-31 LAB — TRIGLYCERIDES: Triglycerides: 198 mg/dL — ABNORMAL HIGH (ref <150)

## 2020-12-31 LAB — MAGNESIUM: Magnesium: 2.6 mg/dL — ABNORMAL HIGH (ref 1.7–2.4)

## 2020-12-31 LAB — CBC
HCT: 29.7 % — ABNORMAL LOW (ref 39.0–52.0)
Hemoglobin: 9.3 g/dL — ABNORMAL LOW (ref 13.0–17.0)
MCH: 25.3 pg — ABNORMAL LOW (ref 26.0–34.0)
MCHC: 31.3 g/dL (ref 30.0–36.0)
MCV: 80.9 fL (ref 80.0–100.0)
Platelets: 227 10*3/uL (ref 150–400)
RBC: 3.67 MIL/uL — ABNORMAL LOW (ref 4.22–5.81)
RDW: 17.6 % — ABNORMAL HIGH (ref 11.5–15.5)
WBC: 9.2 10*3/uL (ref 4.0–10.5)
nRBC: 0 % (ref 0.0–0.2)

## 2020-12-31 LAB — PHOSPHORUS: Phosphorus: 4.6 mg/dL (ref 2.5–4.6)

## 2020-12-31 IMAGING — DX DG ABDOMEN 1V
1 series · 1 of 1 positions shown · non-contrast
Comparison: None.

CLINICAL DATA: Orogastric tube placement

EXAM:
ABDOMEN - 1 VIEW

[abdomen supine]
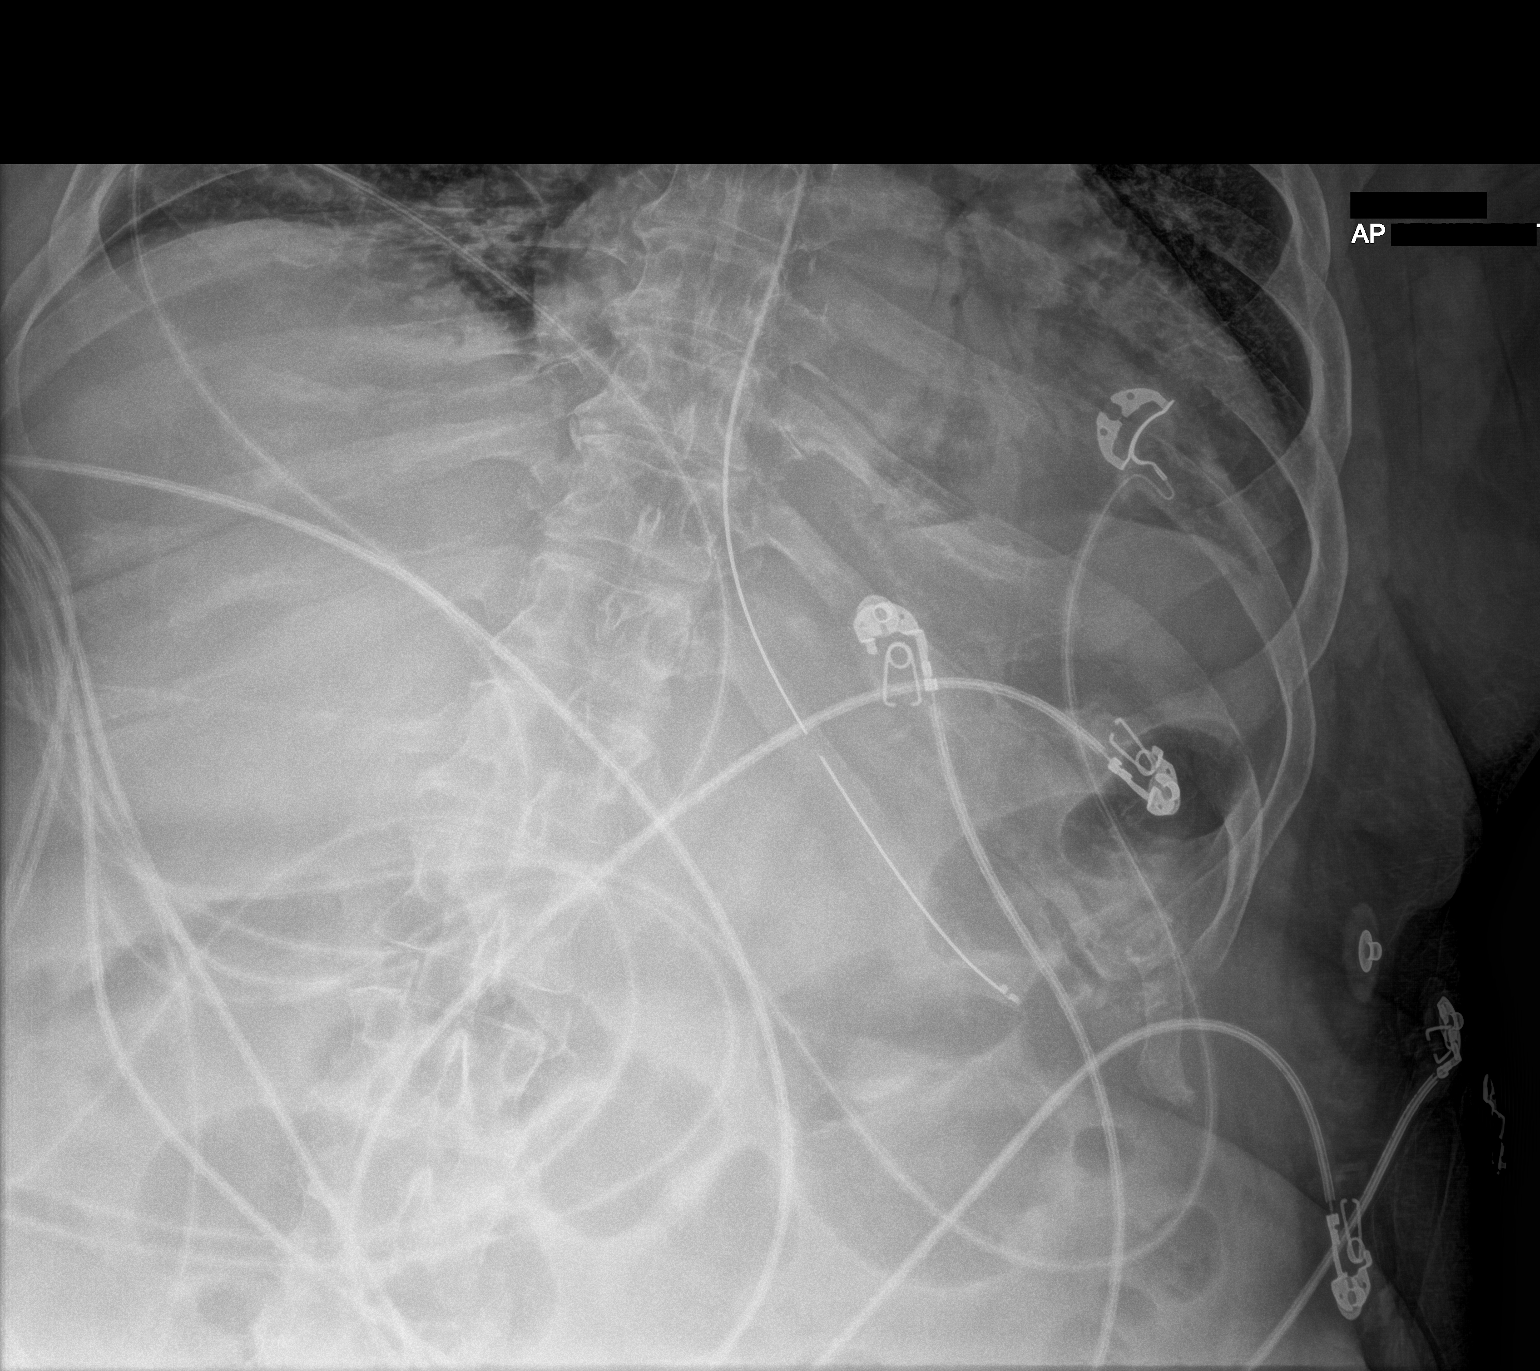

[1 of 1 positions shown; findings below may reference images not displayed]

FINDINGS: Orogastric tube tip and side port are in the stomach. There is no
bowel dilatation or air-fluid level to suggest bowel obstruction. No
free air evident on supine examination. Areas of patchy airspace
opacity in the left lung base noted. Aortic atherosclerosis noted.
IMPRESSION: Orogastric tube tip and side port in stomach. No bowel obstruction
or free air evident on supine examination. Apparent infiltrate left
base. Aortic Atherosclerosis ([ID]-[ID]).

## 2020-12-31 NOTE — Progress Notes (Signed)
[facility]  ROUNDING NOTE   Subjective:   UOP 2281m.   Creatinine 1.56 (1.72) (1.86) (1.82) (2.35) (2.53)   Objective:  Vital signs in last 24 hours:  Temp:  [98.1 F (36.7 C)-99.6 F (37.6 C)] 99.1 F (37.3 C) (05/13 1117) Pulse Rate:  [57-72] 62 (05/13 1300) Resp:  [18-24] 21 (05/13 1300) BP: (103-142)/(38-75) 133/48 (05/13 1300) SpO2:  [94 %-100 %] 95 % (05/13 1300) FiO2 (%):  [28 %] 28 % (05/13 1202) Weight:  [123.4 kg] 123.4 kg (05/13 0312)  Weight change: 1.8 kg Filed Weights   12/28/20 0431 12/30/20 0450 12/31/20 0312  Weight: 125.6 kg 121.6 kg 123.4 kg    Intake/Output: I/O last 3 completed shifts: In: 2990.9 [I.V.:1209.2; NG/GT:782.3; IV Piggyback:999.5] Out: 3065 [Urine:3065]   Intake/Output this shift:  Total I/O In: 1375.5 [I.V.:322.2; NG/GT:1053.3] Out: 725 [Urine:725]  Physical Exam: General: Critically ill  Head: ETT   Eyes: Eyes closed  Neck: trachea midline  Lungs:  PRVC FiO2 28%  Heart: Regular rate and rhythm  Abdomen:  Soft   Extremities:  + peripheral edema.  Neurologic: Intubated and sedated  Skin: No lesions  Access: Left femoral vascath 5/8    Basic Metabolic Panel: Recent Labs  Lab 12/27/20 0407 12/28/20 0429 12/29/20 0420 12/30/20 0533 12/31/20 0331  NA 136 138 141 141 142  K 4.0 3.8 4.3 4.3 4.3  CL 111 112* 113* 112* 116*  CO2 22 19* 19* 20* 20*  GLUCOSE 114* 146* 124* 117* 131*  BUN 29* 28* 33* 40* 43*  CREATININE 2.35* 1.82* 1.86* 1.72* 1.56*  CALCIUM 7.8* 8.2* 8.3* 8.2* 8.2*  MG 1.9 2.3 2.3 2.6* 2.6*  PHOS 2.5 3.1 4.5 4.8* 4.6    Liver Function Tests: Recent Labs  Lab 12/25/20 1545 12/26/20 0420  AST 85* 73*  ALT 58* 46*  ALKPHOS 82 62  BILITOT 0.6 0.8  PROT 7.9 7.4  ALBUMIN 3.2* 3.0*   No results for input(s): LIPASE, AMYLASE in the last 168 hours. No results for input(s): AMMONIA in the last 168 hours.  CBC: Recent Labs  Lab 12/25/20 1451 12/26/20 0420 12/27/20 0407  12/28/20 0429 12/29/20 0420 12/30/20 0533 12/31/20 0331  WBC 17.7*   < > 8.8 9.1 10.2 9.1 9.2  NEUTROABS 5.1  --   --   --  5.7  --   --   HGB 14.0   < > 10.7* 10.0* 10.3* 9.8* 9.3*  HCT 46.6   < > 32.8* 32.2* 32.5* 31.3* 29.7*  MCV 83.4   < > 77.9* 78.7* 78.9* 80.1 80.9  PLT 171   < > 155 173 203 203 227   < > = values in this interval not displayed.    Cardiac Enzymes: No results for input(s): CKTOTAL, CKMB, CKMBINDEX, TROPONINI in the last 168 hours.  BNP: Invalid input(s): POCBNP  CBG: Recent Labs  Lab 12/30/20 1920 12/30/20 2308 12/31/20 0310 12/31/20 0749 12/31/20 1125  GLUCAP 116* 112* 113* 123* 119*    Microbiology: Results for orders placed or performed during the hospital encounter of 12/25/20  Culture, blood (routine x 2)     Status: Abnormal   Collection Time: 12/25/20  2:55 PM   Specimen: BLOOD  Result Value Ref Range Status   Specimen Description   Final    BLOOD BLOOD RIGHT HAND [facility] [address] [place] [address]   Special Requests   Final    BOTTLES DRAWN AEROBIC AND ANAEROBIC  Blood Culture adequate volume Performed at [facility], [address] [place], [place] [address]    Culture  Setup Time   Final    GRAM POSITIVE COCCI AEROBIC BOTTLE ONLY CRITICAL RESULT CALLED TO, READ BACK BY AND VERIFIED WITH: MORGAN CUNNIGHAN AT 2595 ON 12/26/20 BY SS    Culture (A)  Final    STAPHYLOCOCCUS HOMINIS THE SIGNIFICANCE OF ISOLATING THIS ORGANISM FROM A SINGLE SET OF BLOOD CULTURES WHEN MULTIPLE SETS ARE DRAWN IS UNCERTAIN. PLEASE NOTIFY THE MICROBIOLOGY DEPARTMENT WITHIN ONE WEEK IF SPECIATION AND SENSITIVITIES ARE REQUIRED. Performed at [facility], [place] [address], [place], [place] [address]    Report Status 12/28/2020 FINAL  Final  Blood Culture ID Panel (Reflexed)     Status: Abnormal   Collection Time: 12/25/20  2:55 PM  Result Value Ref Range Status   Enterococcus faecalis NOT  DETECTED NOT DETECTED Final   Enterococcus Faecium NOT DETECTED NOT DETECTED Final   Listeria monocytogenes NOT DETECTED NOT DETECTED Final   Staphylococcus species DETECTED (A) NOT DETECTED Final    Comment: CRITICAL RESULT CALLED TO, READ BACK BY AND VERIFIED WITH: MORGAN CUNNINGHAN AT 1159 ON 12/26/20 BY SS    Staphylococcus aureus (BCID) NOT DETECTED NOT DETECTED Final   Staphylococcus epidermidis NOT DETECTED NOT DETECTED Final   Staphylococcus lugdunensis NOT DETECTED NOT DETECTED Final   Streptococcus species NOT DETECTED NOT DETECTED Final   Streptococcus agalactiae NOT DETECTED NOT DETECTED Final   Streptococcus pneumoniae NOT DETECTED NOT DETECTED Final   Streptococcus pyogenes NOT DETECTED NOT DETECTED Final   A.calcoaceticus-baumannii NOT DETECTED NOT DETECTED Final   Bacteroides fragilis NOT DETECTED NOT DETECTED Final   Enterobacterales NOT DETECTED NOT DETECTED Final   Enterobacter cloacae complex NOT DETECTED NOT DETECTED Final   Escherichia coli NOT DETECTED NOT DETECTED Final   Klebsiella aerogenes NOT DETECTED NOT DETECTED Final   Klebsiella oxytoca NOT DETECTED NOT DETECTED Final   Klebsiella pneumoniae NOT DETECTED NOT DETECTED Final   Proteus species NOT DETECTED NOT DETECTED Final   Salmonella species NOT DETECTED NOT DETECTED Final   Serratia marcescens NOT DETECTED NOT DETECTED Final   Haemophilus influenzae NOT DETECTED NOT DETECTED Final   Neisseria meningitidis NOT DETECTED NOT DETECTED Final   Pseudomonas aeruginosa NOT DETECTED NOT DETECTED Final   Stenotrophomonas maltophilia NOT DETECTED NOT DETECTED Final   Candida albicans NOT DETECTED NOT DETECTED Final   Candida auris NOT DETECTED NOT DETECTED Final   Candida glabrata NOT DETECTED NOT DETECTED Final   Candida krusei NOT DETECTED NOT DETECTED Final   Candida parapsilosis NOT DETECTED NOT DETECTED Final   Candida tropicalis NOT DETECTED NOT DETECTED Final   Cryptococcus neoformans/gattii NOT DETECTED  NOT DETECTED Final    Comment: Performed at [facility], [address] [place], [place] [address]  Resp Panel by RT-PCR (Flu A&B, Covid) Nasopharyngeal Swab     Status: None   Collection Time: 12/25/20  5:51 PM   Specimen: Nasopharyngeal Swab; Nasopharyngeal(NP) swabs in vial transport medium  Result Value Ref Range Status   SARS Coronavirus 2 by RT PCR NEGATIVE NEGATIVE Final    Comment: (NOTE) SARS-CoV-2 target nucleic acids are NOT DETECTED.  The SARS-CoV-2 RNA is generally detectable in upper respiratory specimens during the acute phase of infection. The lowest concentration of SARS-CoV-2 viral copies this assay can detect is 138 copies/mL. A negative result does not preclude SARS-Cov-2 infection and should not be used as the sole basis for treatment or  other patient management decisions. A negative result may occur with  improper specimen collection/handling, submission of specimen other than nasopharyngeal swab, presence of viral mutation(s) within the areas targeted by this assay, and inadequate number of viral copies(<138 copies/mL). A negative result must be combined with clinical observations, patient history, and epidemiological information. The expected result is Negative.  Fact Sheet for Patients:  EntrepreneurPulse.com.au  Fact Sheet for Healthcare Providers:  IncredibleEmployment.be  This test is no t yet approved or cleared by the [place] FDA and  has been authorized for detection and/or diagnosis of SARS-CoV-2 by FDA under an Emergency Use Authorization (EUA). This EUA will remain  in effect (meaning this test can be used) for the duration of the COVID-19 declaration under Section 564(b)(1) of the Act, 21 U.S.C.section 360bbb-3(b)(1), unless the authorization is terminated  or revoked sooner.       Influenza A by PCR NEGATIVE NEGATIVE Final   Influenza B by PCR NEGATIVE NEGATIVE Final    Comment:  (NOTE) The Xpert Xpress SARS-CoV-2/FLU/RSV plus assay is intended as an aid in the diagnosis of influenza from Nasopharyngeal swab specimens and should not be used as a sole basis for treatment. Nasal washings and aspirates are unacceptable for Xpert Xpress SARS-CoV-2/FLU/RSV testing.  Fact Sheet for Patients: EntrepreneurPulse.com.au  Fact Sheet for Healthcare Providers: IncredibleEmployment.be  This test is not yet approved or cleared by the [place] FDA and has been authorized for detection and/or diagnosis of SARS-CoV-2 by FDA under an Emergency Use Authorization (EUA). This EUA will remain in effect (meaning this test can be used) for the duration of the COVID-19 declaration under Section 564(b)(1) of the Act, 21 U.S.C. section 360bbb-3(b)(1), unless the authorization is terminated or revoked.  Performed at [facility], [address] [place], [place] [address]   MRSA PCR Screening     Status: None   Collection Time: 12/25/20  6:39 PM   Specimen: Nasopharyngeal  Result Value Ref Range Status   MRSA by PCR NEGATIVE NEGATIVE Final    Comment:        The GeneXpert MRSA Assay (FDA approved for NASAL specimens only), is one component of a comprehensive MRSA colonization surveillance program. It is not intended to diagnose MRSA infection nor to guide or monitor treatment for MRSA infections. Performed at [facility], [address] [place], [place] [address]   Culture, blood (routine x 2)     Status: None   Collection Time: 12/25/20  6:43 PM   Specimen: BLOOD  Result Value Ref Range Status   Specimen Description BLOOD Blood Culture adequate volume  Final   Special Requests   Final    BOTTLES DRAWN AEROBIC AND ANAEROBIC LEFT ANTECUBITAL   Culture   Final    NO GROWTH 5 DAYS Performed at [facility], [address] [place], [place] [address]    Report Status 12/30/2020 FINAL  Final  Culture,  Respiratory w Gram Stain     Status: None   Collection Time: 12/26/20  6:27 AM   Specimen: Tracheal Aspirate; Respiratory  Result Value Ref Range Status   Specimen Description   Final    TRACHEAL ASPIRATE Performed at [facility], [address], [place],  [address]    Special Requests   Final    NONE Performed at [facility], [place], [place] [address]    Gram Stain   Final    FEW WBC PRESENT,BOTH PMN AND MONONUCLEAR RARE GRAM POSITIVE  COCCI IN PAIRS    Culture   Final    RARE Normal respiratory flora-no Staph aureus or Pseudomonas seen Performed at [facility] [address], [place], [place] [address]    Report Status 12/28/2020 FINAL  Final  Aerobic Culture w Gram Stain (superficial specimen)     Status: None (Preliminary result)   Collection Time: 12/30/20  9:05 AM   Specimen: Wound  Result Value Ref Range Status   Specimen Description WOUND LEFT LEG  Final   Special Requests NONE  Final   Gram Stain   Final    ABUNDANT WBC PRESENT,BOTH PMN AND MONONUCLEAR NO ORGANISMS SEEN    Culture   Final    CULTURE REINCUBATED FOR BETTER GROWTH Performed at [facility], [place] [address] [place], [place] [address]    Report Status PENDING  Incomplete    Coagulation Studies: No results for input(s): LABPROT, INR in the last 72 hours.  Urinalysis: No results for input(s): COLORURINE, LABSPEC, PHURINE, GLUCOSEU, HGBUR, BILIRUBINUR, KETONESUR, PROTEINUR, UROBILINOGEN, NITRITE, LEUKOCYTESUR in the last 72 hours.  Invalid input(s): APPERANCEUR    Imaging: No results found.   Medications:   . sodium chloride    . sodium chloride    . norepinephrine (LEVOPHED) Adult infusion 2 mcg/min (12/31/20 1120)  . propofol (DIPRIVAN) infusion 55 mcg/kg/min (12/31/20 1120)   . chlorhexidine gluconate (MEDLINE KIT)  15 mL Mouth Rinse BID  . Chlorhexidine Gluconate Cloth  6 each Topical Q0600  . docusate  100 mg Per Tube BID  .  enoxaparin (LOVENOX) injection  0.5 mg/kg Subcutaneous Q24H  . famotidine  20 mg Per Tube Daily  . feeding supplement (PROSource TF)  90 mL Per Tube TID  . feeding supplement (VITAL HIGH PROTEIN)  1,000 mL Per Tube Q24H  . free water  30 mL Per Tube Q4H  . insulin aspart  0-15 Units Subcutaneous Q4H  . mouth rinse  15 mL Mouth Rinse 10 times per day  . polyethylene glycol  17 g Per Tube Daily  . sodium chloride flush  10-40 mL Intracatheter Q12H  . sodium chloride flush  3 mL Intravenous Q12H   sodium chloride, acetaminophen (TYLENOL) oral liquid 160 mg/5 mL, albuterol, docusate, fentaNYL (SUBLIMAZE) injection, hydrALAZINE, midazolam, polyethylene glycol, sodium chloride flush, sodium chloride flush  Assessment/ Plan:  Mr. Jeremy Browning is a 60 y.o. black male with diabetes mellitus type II, hypertension, congestive heart failure, who was admitted to [facility] on 12/25/2020 for Cardiogenic shock ([place]) [R57.0] Flash pulmonary edema ([place]) [J81.0] Cardiac arrest with successful resuscitation [facility]) [I46.9] Cardiopulmonary arrest with successful resuscitation ([place]) [I46.9]  1. Acute kidney injury with hyperkalemia: Proteinuria and hematuria on urinalysis.  Creatinine continues to improve. Chronic kidney disease with unknown baseline creatinine.  Cardiac catheterization 5/7. ATN from hypotensive episode,  Nonoliguric urine output.  No indication for dialysis. Has not required dialysis this admission.  - Continue free water with tube feeds  2. Acute Respiratory Failure: requiring intubation and mechanical ventilation. Secondary to flash pulmonary edema from acute exacerbation of systolic and diastolic chronic congestive heart failure and cardiogenic shock from acute ischemic event.   3. Cardiogenic shock and possible septic shock. No longer on vasopressors.  - empiric antibiotics: unasyn  4. Hypertension:  - hydralazine and isosorbide mononitrate  5. Diabetes mellitus type II with renal  manifestations: currently not on any outpatient agents. Hemoglobin A1c 5.9%.   6. Anemia with kidney failure: Hemoglobin 9.8. Normocytic.    LOS: 6  Jeremy Browning 5/13/20221:10 PM

## 2020-12-31 NOTE — Progress Notes (Signed)
WUA deferred due to ventilator dyssynchrony with a RASS of -4. Per neurology we will do WUA with neuro at bedside 5/14. R pupil 2 mm sluggish, L pupil 3 and brisk.  PRN versed and fentanyl administered for ventilator dyssynchrony. BP soft after PRN sedatives but resolved on its own without vasopressors. SB 50's. Adequate foley output. Non-purposeful movement and tremors in lower extremities intermittently.

## 2020-12-31 NOTE — Progress Notes (Signed)
Neurology Progress Note  Patient ID: 60 yo man with hx obesity, diabetes, hypertension, combined systolic and diastolic heart failure presented to ED on May 7 in respiratory distress c/b NSTEMI and subsequently went into cardiac arrest in ED.  Neurology is consulted for prognostication after cardiac arrest  Interval events: - NAEON - Patient continues on sedation for BP and vent mgmt - GOC discussions continue; palliative care is involved - no new developments past 24 hrs  Exam: Vitals:   12/31/20 1230 12/31/20 1300  BP: (!) 139/50 (!) 133/48  Pulse: 61 62  Resp: 18 (!) 21  Temp:    SpO2: 95% 95%   Physical Exam Gen: obtunded HEENT: Atraumatic, normocephalic Resp: CTAB, no w/r/r CV: RRR, no m/g/r Abd: soft/NT/ND Extrem: Nml bulk; no cyanosis, clubbing, or edema.  Pt examined on sedation today, exam unchanged  Neuro: *MS: obtunded, occasionally stirs briefly in response to noxious stimuli *Speech: intubated, no attempt to speak *CN: PERRL, (+) corneals, oculocephalics, gag, cough *Motor & sensory: no spontaneous movement or movement to command of any extremity. No response to noxious stimuli in BUE. Withdrawal to noxious stimuli in both RLE and LLE.  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 No electrographic seizures were identified.  Impression: 60 yo man w/ hx obesity, DM2, HTN, systolic and diastolic HF p/w respiratory distress in setting of NSTEMI  with acute severe systolic heart failure now s/p cardiac arrest 5/7 with signs and symptoms of brain damage. Did not undergo TTM. Off sedation his exam has at times shows reactive pupils, intact brainstem reflees, and occasional withdrawal to noxious stimuli to either foot. His MRI does show e/o hypoxic ischemic damage (see above) and EEG corroborates severe global cerebral dysfunction with superimposed R focal dysfunction. I agree that GOC discussions are paramount at this time. The chance of him making a full recovery is diminishingly small. He would likely require trach/peg to survive and would require full care after that. I suggested that his family consider what Salih would have wanted and if he would have considered that a desired outcome or a meaningful life. They will continue family discussions and update myself and Dr. Belia Heman with decisions or questions.  Recommendations: 1) Continue GOC discussions 2) Defer MRA H&N for now, would not change mgmt   Will continue to follow.    Bing Neighbors, MD [facility] [phone]  If 7pm- 7am, please page neurology on call as listed in AMION.

## 2020-12-31 NOTE — Progress Notes (Signed)
NAME:  Jerol Rufener, MRN:  [id number], DOB:  September 28, 1960, LOS: 6 ADMISSION DATE:  12/25/2020  60 yo M presenting to [facility] ED from home via EMS with respiratory distress that had been progressively worsening over the course of 12/25/20. Per documentation family stated the patient was in his normal state of health until this morning when he began feeling unwell. He then called out stating he could not breathe. EMS found the patient hypoxic and in distress with SpO2 in the 70's on room air. Patient was place on CPAP, arriving to the ED unresponsive and pulseless. ED course: Upon arrival to ED patient was defibrillated x 1 and received CPR before achieving ROSC. Initial EKG suggestive of global ischemia, second EKG had possible LBBB vs IVCD. Patient was emergently intubated requiring mechanical ventilation. Per ED documentation STEMI team activated in the setting of a witnessed shockable cardiac arrest with clinical exam highly suggestive of pulmonary edema with pink frothy sputum throughout ET tubing.  Pertinent Medical History  Obesity Hypertension Diabetes mellitus Chronic renal insufficiency Combined systolic and diastolic heart failure  Significant Hospital Events: Including procedures, antibiotic start and stop dates in addition to other pertinent events   Cardiac catheterization on presentation with normal coronaries, good cardiac output, elevated pulmonary capillary wedge pressure.  5/7 severe resp failure 5/8 remains on Vent 5/9 remains on vent , multiorgan failure MRI shows brain injury 5/10 remains on vent, multiorgan failure, brother updated 5/11 severe resp failure  MICRO DATA 5/7STAPHYLOCOCCUS HOMINIS1/4 cultures MRSA NEG 5/8 sputum cx pending  COVID And INF A/B NEG   Antibiotics Given (last 72 hours)    Date/Time Action Medication Dose Rate   12/28/20 0751 New Bag/Given   ampicillin-sulbactam (UNASYN) 1.5 g in sodium chloride 0.9 % 100 mL IVPB 1.5 g 200  mL/hr   12/28/20 1522 New Bag/Given   Ampicillin-Sulbactam (UNASYN) 3 g in sodium chloride 0.9 % 100 mL IVPB 3 g 200 mL/hr   12/28/20 2058 New Bag/Given   Ampicillin-Sulbactam (UNASYN) 3 g in sodium chloride 0.9 % 100 mL IVPB 3 g 200 mL/hr   12/29/20 0245 New Bag/Given   Ampicillin-Sulbactam (UNASYN) 3 g in sodium chloride 0.9 % 100 mL IVPB 3 g 200 mL/hr   12/29/20 0820 New Bag/Given   Ampicillin-Sulbactam (UNASYN) 3 g in sodium chloride 0.9 % 100 mL IVPB 3 g 200 mL/hr   12/29/20 1413 New Bag/Given   Ampicillin-Sulbactam (UNASYN) 3 g in sodium chloride 0.9 % 100 mL IVPB 3 g 200 mL/hr   12/29/20 1938 New Bag/Given   Ampicillin-Sulbactam (UNASYN) 3 g in sodium chloride 0.9 % 100 mL IVPB 3 g 200 mL/hr        Interim History / Subjective:  Remains intubated Remains critically ill Prognosis is very poor Patient with severe brain damage Neuro and palliative care following Remains full code    CBC    Component Value Date/Time   WBC 9.2 12/31/2020 0331   RBC 3.67 (L) 12/31/2020 0331   HGB 9.3 (L) 12/31/2020 0331   HCT 29.7 (L) 12/31/2020 0331   PLT 227 12/31/2020 0331   MCV 80.9 12/31/2020 0331   MCH 25.3 (L) 12/31/2020 0331   MCHC 31.3 12/31/2020 0331   RDW 17.6 (H) 12/31/2020 0331   LYMPHSABS 2.9 12/29/2020 0420   MONOABS 1.3 (H) 12/29/2020 0420   EOSABS 0.2 12/29/2020 0420   BASOSABS 0.0 12/29/2020 0420   BMP Latest Ref Rng & Units 12/31/2020 12/30/2020 12/29/2020  Glucose 70 - 99 mg/dL 119(J) 478(G)  124(H)  BUN 6 - 20 mg/dL 68(E) 32(Z) 22(Q)  Creatinine 0.61 - 1.24 mg/dL 8.25(O) 0.37(C) 4.88(Q)  Sodium 135 - 145 mmol/L 142 141 141  Potassium 3.5 - 5.1 mmol/L 4.3 4.3 4.3  Chloride 98 - 111 mmol/L 116(H) 112(H) 113(H)  CO2 22 - 32 mmol/L 20(L) 20(L) 19(L)  Calcium 8.9 - 10.3 mg/dL 8.2(L) 8.2(L) 8.3(L)       Objective   Blood pressure (!) 132/48, pulse 61, temperature 99.5 F (37.5 C), temperature source Axillary, resp. rate 19, height 6' 0.01" (1.829 m), weight  123.4 kg, SpO2 98 %.    Vent Mode: PRVC FiO2 (%):  [28 %] 28 % Set Rate:  [16 bmp] 16 bmp Vt Set:  [550 mL] 550 mL PEEP:  [5 cmH20] 5 cmH20   Intake/Output Summary (Last 24 hours) at 12/31/2020 0738 Last data filed at 12/31/2020 0400 Gross per 24 hour  Intake 2270.28 ml  Output 2040 ml  Net 230.28 ml   Filed Weights   12/28/20 0431 12/30/20 0450 12/31/20 9169  Weight: 125.6 kg 121.6 kg 123.4 kg      REVIEW OF SYSTEMS  PATIENT IS UNABLE TO PROVIDE COMPLETE REVIEW OF SYSTEMS DUE TO SEVERE CRITICAL ILLNESS AND TOXIC METABOLIC ENCEPHALOPATHY   PHYSICAL EXAMINATION:  GENERAL:critically ill appearing, +resp distress HEAD: Normocephalic, atraumatic.  EYES: Pupils equal, round, reactive to light.  No scleral icterus.  MOUTH: Moist mucosal membrane. NECK: Supple. PULMONARY: +rhonchi, +wheezing CARDIOVASCULAR: S1 and S2. Regular rate and rhythm. No murmurs, rubs, or gallops.  GASTROINTESTINAL: Soft, nontender, -distended. Positive bowel sounds.  MUSCULOSKELETAL: No swelling, clubbing, or edema.  NEUROLOGIC: obtunded SKIN:intact,warm,dry      ASSESSMENT AND PLAN SYNOPSIS   60 yo morbidly obese AAM with acute and severe cardiac arrest NSTEMI with acute severe systolic cardiac failure with signs and symptoms of brain damage  PATIENT IS NOT IMPROVING, PATIENT WITH MULTIORGAN FAILURE AND VERY POOR CHANCE OF MEANINGFUL RECOVERY  PATIENT IS NOT IMPROVING  Severe ACUTE Hypoxic and Hypercapnic Respiratory Failure -continue Mechanical Ventilator support -continue Bronchodilator Therapy -Wean Fio2 and PEEP as tolerated -VAP/VENT bundle implementation Unable to wean, will ned [facility] AND FEEDING TUBE  CARDIAC FAILURE-acute combined systolic/diastolic dysfunction -oxygen as needed -Lasix as tolerated -follow up cardiac enzymes as indicated  CARDIAC ICU monitoring   ACUTE KIDNEY INJURY/Renal Failure -continue Foley Catheter-assess need -Avoid nephrotoxic agents -Follow  urine output, BMP -Ensure adequate renal perfusion, optimize oxygenation -Renal dose medications   NEUROLOGY Acute toxic metabolic encephalopathy, need for sedation Goal RASS -2 to -3   INFECTIOUS DISEASE -continue antibiotics as prescribed -follow up cultures   ENDO - ICU hypoglycemic\Hyperglycemia protocol -check FSBS per protocol   GI GI PROPHYLAXIS as indicated  NUTRITIONAL STATUS DIET-->TF's as tolerated Constipation protocol as indicated   ELECTROLYTES -follow labs as needed -replace as needed -pharmacy consultation and following    Best practice (right click and "Reselect all SmartList Selections" daily)  Diet:NPO Pain/Anxiety/Delirium protocol (if indicated):Yes(RASS goal -1,-2) VAP protocol (if indicated):Yes DVT prophylaxis:Subcutaneous Heparin GI prophylaxis:H2B Glucose control:SSIYes Central venous access:Yes, and it is still needed Arterial line:Yes, and it is still needed Foley:Yes, and it is still needed Mobility:bed rest PT consulted:No Code Status:full code Disposition:ICU    Labs   CBC: Recent Labs  Lab 12/25/20 1451 12/26/20 0420 12/27/20 0407 12/28/20 0429 12/29/20 0420 12/30/20 0533 12/31/20 0331  WBC 17.7*   < > 8.8 9.1 10.2 9.1 9.2  NEUTROABS 5.1  --   --   --  5.7  --   --  HGB 14.0   < > 10.7* 10.0* 10.3* 9.8* 9.3*  HCT 46.6   < > 32.8* 32.2* 32.5* 31.3* 29.7*  MCV 83.4   < > 77.9* 78.7* 78.9* 80.1 80.9  PLT 171   < > 155 173 203 203 227   < > = values in this interval not displayed.    Basic Metabolic Panel: Recent Labs  Lab 12/27/20 0407 12/28/20 0429 12/29/20 0420 12/30/20 0533 12/31/20 0331  NA 136 138 141 141 142  K 4.0 3.8 4.3 4.3 4.3  CL 111 112* 113* 112* 116*  CO2 22 19* 19* 20* 20*  GLUCOSE 114* 146* 124* 117* 131*  BUN 29* 28* 33* 40* 43*  CREATININE 2.35* 1.82* 1.86* 1.72* 1.56*  CALCIUM 7.8* 8.2* 8.3* 8.2* 8.2*  MG 1.9 2.3 2.3 2.6* 2.6*  PHOS 2.5 3.1 4.5 4.8* 4.6    GFR: Estimated Creatinine Clearance: 68.3 mL/min (A) (by C-G formula based on SCr of 1.56 mg/dL (H)). Recent Labs  Lab 12/25/20 1451 12/25/20 1545 12/25/20 1843 12/26/20 0420 12/27/20 0407 12/28/20 0429 12/29/20 0420 12/30/20 0533 12/31/20 0331  PROCALCITON  --  <0.10  --  33.57 23.40  --   --   --   --   WBC 17.7*  --   --  13.2* 8.8 9.1 10.2 9.1 9.2  LATICACIDVEN 7.8*  --  1.9  --   --   --   --   --   --     Liver Function Tests: Recent Labs  Lab 12/25/20 1545 12/26/20 0420  AST 85* 73*  ALT 58* 46*  ALKPHOS 82 62  BILITOT 0.6 0.8  PROT 7.9 7.4  ALBUMIN 3.2* 3.0*   No results for input(s): LIPASE, AMYLASE in the last 168 hours. No results for input(s): AMMONIA in the last 168 hours.  ABG    Component Value Date/Time   PHART 7.37 12/29/2020 1032   PCO2ART 35 12/29/2020 1032   PO2ART 81 (L) 12/29/2020 1032   HCO3 20.2 12/29/2020 1032   ACIDBASEDEF 4.5 (H) 12/29/2020 1032   O2SAT 95.5 12/29/2020 1032     Coagulation Profile: Recent Labs  Lab 12/25/20 1451  INR 1.2    Cardiac Enzymes: No results for input(s): CKTOTAL, CKMB, CKMBINDEX, TROPONINI in the last 168 hours.  HbA1C: Hgb A1c MFr Bld  Date/Time Value Ref Range Status  12/25/2020 04:46 PM 5.9 (H) 4.8 - 5.6 % Final    Comment:    (NOTE) Pre diabetes:          5.7%-6.4%  Diabetes:              >6.4%  Glycemic control for   <7.0% adults with diabetes     CBG: Recent Labs  Lab 12/30/20 1136 12/30/20 1544 12/30/20 1920 12/30/20 2308 12/31/20 0310  GLUCAP 118* 115* 116* 112* 113*    Allergies Not on File     DVT/GI PRX  assessed I Assessed the need for Labs I Assessed the need for Foley I Assessed the need for Central Venous Line Family Discussion when available I Assessed the need for Mobilization I made an Assessment of medications to be adjusted accordingly Safety Risk assessment completed  CASE DISCUSSED IN MULTIDISCIPLINARY ROUNDS WITH ICU TEAM   Critical Care  Time devoted to patient care services described in this note is 65  minutes.  Critical care was necessary to treat or prevent imminent or life-threatening deterioration. Overall, patient is critically ill, prognosis is guarded.  Patient with Multiorgan  failure and at high risk for cardiac arrest and death.   RECOMMEND DNR STATUS  Lucie Leather, M.D.  [facility]  Medical Director [facility] Medical Director [facility] Cardio-Pulmonary Department

## 2020-12-31 NOTE — Progress Notes (Signed)
GOALS OF CARE DISCUSSION  The Clinical status was relayed to family in detail. Sister at bedside  Updated and notified of patients medical condition. Patient remains unresponsive and will not open eyes to command.   Patient is having a weak cough and struggling to remove secretions.   Patient with increased WOB and using accessory muscles to breathe Explained to family course of therapy and the modalities    Patient with Progressive multiorgan failure with a very high probablity of a very minimal chance of meaningful recovery despite all aggressive and optimal medical therapy.  Patient is suffering and dying process, will need artifical support to stay alive PATIENT REMAINS FULL CODE  Family understands the situation. +Severe brain damage +multiorgan failure    Family are satisfied with Plan of action and management. All questions answered  Additional CC time 40 mins   Isham Smitherman Santiago Glad, M.D.  [facility]  Medical Director [facility] Medical Director [facility] Cardio-Pulmonary Department

## 2020-12-31 NOTE — Progress Notes (Signed)
PHARMACY CONSULT NOTE - FOLLOW UP  Pharmacy Consult for Electrolyte Monitoring and Replacement   Recent Labs: Potassium (mmol/L)  Date Value  12/31/2020 4.3   Magnesium (mg/dL)  Date Value  91/63/8466 2.6 (H)   Calcium (mg/dL)  Date Value  59/93/5701 8.2 (L)   Albumin (g/dL)  Date Value  77/93/9030 3.0 (L)   Phosphorus (mg/dL)  Date Value  05/13/3006 4.6   Sodium (mmol/L)  Date Value  12/31/2020 142   Corrected calcium = 9.0 mg/dL  Assessment:  60 year old male with obesity, diabetes mellitus, hypertension, and combined diastolic and systolic heart failure who presented to the emergency department yesterday in respiratory distress admitted following witnessed cardiac arrest. Pt is S/P CPR intubation. Pt with AKI on suspected CKD insetting of shock; nephrology following. Pharmacy consulted for electrolyte monitoring and replacement.  Lasix drip started 5/7 evening and discontinued 5/8. PROSource 5mL TID Vital High Protein @25ml /hr Free water 30mL q4h  Goal of Therapy:  Electrolytes WNL  Plan:   No replacement indicated at this time  Will follow with AM labs and replace as needed.  31m, PharmD Pharmacy Resident  12/31/2020 6:44 AM

## 2021-01-01 DIAGNOSIS — N179 Acute kidney failure, unspecified: Secondary | ICD-10-CM | POA: Diagnosis not present

## 2021-01-01 DIAGNOSIS — J810 Acute pulmonary edema: Secondary | ICD-10-CM | POA: Diagnosis not present

## 2021-01-01 DIAGNOSIS — N189 Chronic kidney disease, unspecified: Secondary | ICD-10-CM | POA: Diagnosis not present

## 2021-01-01 DIAGNOSIS — I469 Cardiac arrest, cause unspecified: Secondary | ICD-10-CM | POA: Diagnosis not present

## 2021-01-01 LAB — BASIC METABOLIC PANEL
Anion gap: 5 (ref 5–15)
BUN: 41 mg/dL — ABNORMAL HIGH (ref 6–20)
CO2: 20 mmol/L — ABNORMAL LOW (ref 22–32)
Calcium: 8.3 mg/dL — ABNORMAL LOW (ref 8.9–10.3)
Chloride: 114 mmol/L — ABNORMAL HIGH (ref 98–111)
Creatinine, Ser: 1.35 mg/dL — ABNORMAL HIGH (ref 0.61–1.24)
GFR, Estimated: >60 mL/min (ref >60)
Glucose, Bld: 137 mg/dL — ABNORMAL HIGH (ref 70–99)
Potassium: 4.5 mmol/L (ref 3.5–5.1)
Sodium: 139 mmol/L (ref 135–145)

## 2021-01-01 LAB — CBC
HCT: 31.9 % — ABNORMAL LOW (ref 39.0–52.0)
Hemoglobin: 9.8 g/dL — ABNORMAL LOW (ref 13.0–17.0)
MCH: 24.9 pg — ABNORMAL LOW (ref 26.0–34.0)
MCHC: 30.7 g/dL (ref 30.0–36.0)
MCV: 81 fL (ref 80.0–100.0)
Platelets: 282 10*3/uL (ref 150–400)
RBC: 3.94 MIL/uL — ABNORMAL LOW (ref 4.22–5.81)
RDW: 17.3 % — ABNORMAL HIGH (ref 11.5–15.5)
WBC: 9.8 10*3/uL (ref 4.0–10.5)
nRBC: 0 % (ref 0.0–0.2)

## 2021-01-01 LAB — GLUCOSE, CAPILLARY
Glucose-Capillary: 100 mg/dL — ABNORMAL HIGH (ref 70–99)
Glucose-Capillary: 103 mg/dL — ABNORMAL HIGH (ref 70–99)
Glucose-Capillary: 120 mg/dL — ABNORMAL HIGH (ref 70–99)
Glucose-Capillary: 120 mg/dL — ABNORMAL HIGH (ref 70–99)
Glucose-Capillary: 124 mg/dL — ABNORMAL HIGH (ref 70–99)
Glucose-Capillary: 127 mg/dL — ABNORMAL HIGH (ref 70–99)

## 2021-01-01 LAB — AEROBIC CULTURE W GRAM STAIN (SUPERFICIAL SPECIMEN): Culture: NORMAL

## 2021-01-01 LAB — MAGNESIUM: Magnesium: 2.5 mg/dL — ABNORMAL HIGH (ref 1.7–2.4)

## 2021-01-01 LAB — PHOSPHORUS: Phosphorus: 4.1 mg/dL (ref 2.5–4.6)

## 2021-01-01 NOTE — Progress Notes (Signed)
[facility]  ROUNDING NOTE   Subjective:   Remains intubated and sedated with propofol Getting TF via OG Tube   Objective:  Vital signs in last 24 hours:  Temp:  [97.7 F (36.5 C)-99.1 F (37.3 C)] 98.5 F (36.9 C) (05/14 0600) Pulse Rate:  [58-67] 67 (05/14 0700) Resp:  [16-35] 35 (05/14 0700) BP: (112-153)/(40-56) 147/51 (05/14 0700) SpO2:  [93 %-100 %] 100 % (05/14 0811) FiO2 (%):  [28 %] 28 % (05/14 0811) Weight:  [117.2 kg] 117.2 kg (05/14 0500)  Weight change: -6.2 kg Filed Weights   12/30/20 0450 12/31/20 0312 01/01/21 0500  Weight: 121.6 kg 123.4 kg 117.2 kg    Intake/Output: I/O last 3 completed shifts: In: 3484.9 [I.V.:1585.9; NG/GT:1210; IV Piggyback:689.1] Out: 3250 [Urine:3250]   Intake/Output this shift:  No intake/output data recorded.  Physical Exam: General: Critically ill  Head: ETT   Eyes: Eyes closed  Lungs:  Vent assisted  Heart: Regular rate and rhythm  Abdomen:  Soft   Extremities:  + peripheral edema.  Neurologic: Intubated and sedated  Skin: No lesions        Basic Metabolic Panel: Recent Labs  Lab 12/28/20 0429 12/29/20 0420 12/30/20 0533 12/31/20 0331 01/01/21 0300  NA 138 141 141 142 139  K 3.8 4.3 4.3 4.3 4.5  CL 112* 113* 112* 116* 114*  CO2 19* 19* 20* 20* 20*  GLUCOSE 146* 124* 117* 131* 137*  BUN 28* 33* 40* 43* 41*  CREATININE 1.82* 1.86* 1.72* 1.56* 1.35*  CALCIUM 8.2* 8.3* 8.2* 8.2* 8.3*  MG 2.3 2.3 2.6* 2.6* 2.5*  PHOS 3.1 4.5 4.8* 4.6 4.1    Liver Function Tests: Recent Labs  Lab 12/25/20 1545 12/26/20 0420  AST 85* 73*  ALT 58* 46*  ALKPHOS 82 62  BILITOT 0.6 0.8  PROT 7.9 7.4  ALBUMIN 3.2* 3.0*   No results for input(s): LIPASE, AMYLASE in the last 168 hours. No results for input(s): AMMONIA in the last 168 hours.  CBC: Recent Labs  Lab 12/25/20 1451 12/26/20 0420 12/28/20 0429 12/29/20 0420 12/30/20 0533 12/31/20 0331 01/01/21 0300  WBC 17.7*   < > 9.1 10.2 9.1 9.2 9.8   NEUTROABS 5.1  --   --  5.7  --   --   --   HGB 14.0   < > 10.0* 10.3* 9.8* 9.3* 9.8*  HCT 46.6   < > 32.2* 32.5* 31.3* 29.7* 31.9*  MCV 83.4   < > 78.7* 78.9* 80.1 80.9 81.0  PLT 171   < > 173 203 203 227 282   < > = values in this interval not displayed.    Cardiac Enzymes: No results for input(s): CKTOTAL, CKMB, CKMBINDEX, TROPONINI in the last 168 hours.  BNP: Invalid input(s): POCBNP  CBG: Recent Labs  Lab 12/31/20 1543 12/31/20 1948 12/31/20 2314 01/01/21 0341 01/01/21 0649  GLUCAP 93 99 91 100* 103*    Microbiology: Results for orders placed or performed during the hospital encounter of 12/25/20  Culture, blood (routine x 2)     Status: Abnormal   Collection Time: 12/25/20  2:55 PM   Specimen: BLOOD  Result Value Ref Range Status   Specimen Description   Final    BLOOD BLOOD RIGHT HAND Performed at [facility], [address] [place], [place] [address]    Special Requests   Final    BOTTLES DRAWN AEROBIC AND ANAEROBIC Blood Culture adequate volume Performed at [facility], [address] [place], [place] [address]    Culture  Setup Time   Final    GRAM POSITIVE COCCI AEROBIC BOTTLE ONLY CRITICAL RESULT CALLED TO, READ BACK BY AND VERIFIED WITH: MORGAN CUNNIGHAN AT 8502 ON 12/26/20 BY SS    Culture (A)  Final    STAPHYLOCOCCUS HOMINIS THE SIGNIFICANCE OF ISOLATING THIS ORGANISM FROM A SINGLE SET OF BLOOD CULTURES WHEN MULTIPLE SETS ARE DRAWN IS UNCERTAIN. PLEASE NOTIFY THE MICROBIOLOGY DEPARTMENT WITHIN ONE WEEK IF SPECIATION AND SENSITIVITIES ARE REQUIRED. Performed at [facility], [place] [address], [place], [place] [address]    Report Status 12/28/2020 FINAL  Final  Blood Culture ID Panel (Reflexed)     Status: Abnormal   Collection Time: 12/25/20  2:55 PM  Result Value Ref Range Status   Enterococcus faecalis NOT DETECTED NOT DETECTED Final   Enterococcus Faecium NOT DETECTED NOT DETECTED Final   Listeria monocytogenes NOT  DETECTED NOT DETECTED Final   Staphylococcus species DETECTED (A) NOT DETECTED Final    Comment: CRITICAL RESULT CALLED TO, READ BACK BY AND VERIFIED WITH: MORGAN CUNNINGHAN AT 1159 ON 12/26/20 BY SS    Staphylococcus aureus (BCID) NOT DETECTED NOT DETECTED Final   Staphylococcus epidermidis NOT DETECTED NOT DETECTED Final   Staphylococcus lugdunensis NOT DETECTED NOT DETECTED Final   Streptococcus species NOT DETECTED NOT DETECTED Final   Streptococcus agalactiae NOT DETECTED NOT DETECTED Final   Streptococcus pneumoniae NOT DETECTED NOT DETECTED Final   Streptococcus pyogenes NOT DETECTED NOT DETECTED Final   A.calcoaceticus-baumannii NOT DETECTED NOT DETECTED Final   Bacteroides fragilis NOT DETECTED NOT DETECTED Final   Enterobacterales NOT DETECTED NOT DETECTED Final   Enterobacter cloacae complex NOT DETECTED NOT DETECTED Final   Escherichia coli NOT DETECTED NOT DETECTED Final   Klebsiella aerogenes NOT DETECTED NOT DETECTED Final   Klebsiella oxytoca NOT DETECTED NOT DETECTED Final   Klebsiella pneumoniae NOT DETECTED NOT DETECTED Final   Proteus species NOT DETECTED NOT DETECTED Final   Salmonella species NOT DETECTED NOT DETECTED Final   Serratia marcescens NOT DETECTED NOT DETECTED Final   Haemophilus influenzae NOT DETECTED NOT DETECTED Final   Neisseria meningitidis NOT DETECTED NOT DETECTED Final   Pseudomonas aeruginosa NOT DETECTED NOT DETECTED Final   Stenotrophomonas maltophilia NOT DETECTED NOT DETECTED Final   Candida albicans NOT DETECTED NOT DETECTED Final   Candida auris NOT DETECTED NOT DETECTED Final   Candida glabrata NOT DETECTED NOT DETECTED Final   Candida krusei NOT DETECTED NOT DETECTED Final   Candida parapsilosis NOT DETECTED NOT DETECTED Final   Candida tropicalis NOT DETECTED NOT DETECTED Final   Cryptococcus neoformans/gattii NOT DETECTED NOT DETECTED Final    Comment: Performed at [facility], [address] [place], [place] [address]   Resp Panel by RT-PCR (Flu A&B, Covid) Nasopharyngeal Swab     Status: None   Collection Time: 12/25/20  5:51 PM   Specimen: Nasopharyngeal Swab; Nasopharyngeal(NP) swabs in vial transport medium  Result Value Ref Range Status   SARS Coronavirus 2 by RT PCR NEGATIVE NEGATIVE Final    Comment: (NOTE) SARS-CoV-2 target nucleic acids are NOT DETECTED.  The SARS-CoV-2 RNA is generally detectable in upper respiratory specimens during the acute phase of infection. The lowest concentration of SARS-CoV-2 viral copies this assay can detect is 138 copies/mL. A negative result does not preclude SARS-Cov-2 infection and should not be used as the sole basis for treatment or other patient management decisions. A negative result may occur with  improper  specimen collection/handling, submission of specimen other than nasopharyngeal swab, presence of viral mutation(s) within the areas targeted by this assay, and inadequate number of viral copies(<138 copies/mL). A negative result must be combined with clinical observations, patient history, and epidemiological information. The expected result is Negative.  Fact Sheet for Patients:  EntrepreneurPulse.com.au  Fact Sheet for Healthcare Providers:  IncredibleEmployment.be  This test is no t yet approved or cleared by the [place] FDA and  has been authorized for detection and/or diagnosis of SARS-CoV-2 by FDA under an Emergency Use Authorization (EUA). This EUA will remain  in effect (meaning this test can be used) for the duration of the COVID-19 declaration under Section 564(b)(1) of the Act, 21 U.S.C.section 360bbb-3(b)(1), unless the authorization is terminated  or revoked sooner.       Influenza A by PCR NEGATIVE NEGATIVE Final   Influenza B by PCR NEGATIVE NEGATIVE Final    Comment: (NOTE) The Xpert Xpress SARS-CoV-2/FLU/RSV plus assay is intended as an aid in the diagnosis of influenza from  Nasopharyngeal swab specimens and should not be used as a sole basis for treatment. Nasal washings and aspirates are unacceptable for Xpert Xpress SARS-CoV-2/FLU/RSV testing.  Fact Sheet for Patients: EntrepreneurPulse.com.au  Fact Sheet for Healthcare Providers: IncredibleEmployment.be  This test is not yet approved or cleared by the [place] FDA and has been authorized for detection and/or diagnosis of SARS-CoV-2 by FDA under an Emergency Use Authorization (EUA). This EUA will remain in effect (meaning this test can be used) for the duration of the COVID-19 declaration under Section 564(b)(1) of the Act, 21 U.S.C. section 360bbb-3(b)(1), unless the authorization is terminated or revoked.  Performed at [facility], [address] [place], [place] [address]   MRSA PCR Screening     Status: None   Collection Time: 12/25/20  6:39 PM   Specimen: Nasopharyngeal  Result Value Ref Range Status   MRSA by PCR NEGATIVE NEGATIVE Final    Comment:        The GeneXpert MRSA Assay (FDA approved for NASAL specimens only), is one component of a comprehensive MRSA colonization surveillance program. It is not intended to diagnose MRSA infection nor to guide or monitor treatment for MRSA infections. Performed at [facility], [address] [place], [place] [address]   Culture, blood (routine x 2)     Status: None   Collection Time: 12/25/20  6:43 PM   Specimen: BLOOD  Result Value Ref Range Status   Specimen Description BLOOD Blood Culture adequate volume  Final   Special Requests   Final    BOTTLES DRAWN AEROBIC AND ANAEROBIC LEFT ANTECUBITAL   Culture   Final    NO GROWTH 5 DAYS Performed at [facility], [address], [place], [place] [address]    Report Status 12/30/2020 FINAL  Final  Culture, Respiratory w Gram Stain     Status: None   Collection Time: 12/26/20  6:27 AM   Specimen: Tracheal Aspirate;  Respiratory  Result Value Ref Range Status   Specimen Description   Final    TRACHEAL ASPIRATE Performed at [facility], [address], [place], [place] [address]    Special Requests   Final    NONE Performed at [facility], [address] [place], [place] [address]    Gram Stain   Final    FEW WBC PRESENT,BOTH PMN AND MONONUCLEAR RARE GRAM POSITIVE COCCI IN PAIRS    Culture   Final  RARE Normal respiratory flora-no Staph aureus or Pseudomonas seen Performed at [place] [address] [place], [place] [address]    Report Status 12/28/2020 FINAL  Final  Aerobic Culture w Gram Stain (superficial specimen)     Status: None (Preliminary result)   Collection Time: 12/30/20  9:05 AM   Specimen: Wound  Result Value Ref Range Status   Specimen Description WOUND LEFT LEG  Final   Special Requests NONE  Final   Gram Stain   Final    ABUNDANT WBC PRESENT,BOTH PMN AND MONONUCLEAR NO ORGANISMS SEEN    Culture   Final    CULTURE REINCUBATED FOR BETTER GROWTH Performed at [facility], [place] [address], [place], [place] [address]    Report Status PENDING  Incomplete    Coagulation Studies: No results for input(s): LABPROT, INR in the last 72 hours.  Urinalysis: No results for input(s): COLORURINE, LABSPEC, PHURINE, GLUCOSEU, HGBUR, BILIRUBINUR, KETONESUR, PROTEINUR, UROBILINOGEN, NITRITE, LEUKOCYTESUR in the last 72 hours.  Invalid input(s): APPERANCEUR    Imaging: DG Abd 1 View  Result Date: 12/31/2020 CLINICAL DATA:  Orogastric tube placement EXAM: ABDOMEN - 1 VIEW COMPARISON:  None. FINDINGS: Orogastric tube tip and side port are in the stomach. There is no bowel dilatation or air-fluid level to suggest bowel obstruction. No free air evident on supine examination. Areas of patchy airspace opacity in the left lung base noted. Aortic atherosclerosis noted. IMPRESSION: Orogastric tube tip and side port in stomach. No bowel obstruction or free  air evident on supine examination. Apparent infiltrate left base. Aortic Atherosclerosis (ICD10-I70.0). Electronically Signed   By: Lowella Grip III M.D.   On: 12/31/2020 16:01     Medications:   . sodium chloride 10 mL/hr at 01/01/21 0400  . sodium chloride    . norepinephrine (LEVOPHED) Adult infusion Stopped (12/31/20 1250)  . propofol (DIPRIVAN) infusion 65 mcg/kg/min (01/01/21 0654)   . chlorhexidine gluconate (MEDLINE KIT)  15 mL Mouth Rinse BID  . Chlorhexidine Gluconate Cloth  6 each Topical Q0600  . docusate  100 mg Per Tube BID  . enoxaparin (LOVENOX) injection  0.5 mg/kg Subcutaneous Q24H  . famotidine  20 mg Per Tube Daily  . feeding supplement (PROSource TF)  90 mL Per Tube TID  . feeding supplement (VITAL HIGH PROTEIN)  1,000 mL Per Tube Q24H  . free water  30 mL Per Tube Q4H  . insulin aspart  0-15 Units Subcutaneous Q4H  . mouth rinse  15 mL Mouth Rinse 10 times per day  . polyethylene glycol  17 g Per Tube Daily  . sodium chloride flush  10-40 mL Intracatheter Q12H  . sodium chloride flush  3 mL Intravenous Q12H   sodium chloride, acetaminophen (TYLENOL) oral liquid 160 mg/5 mL, albuterol, docusate, fentaNYL (SUBLIMAZE) injection, hydrALAZINE, midazolam, polyethylene glycol, sodium chloride flush, sodium chloride flush  Assessment/ Plan:  Jeremy Browning is a 60 y.o. black male with diabetes mellitus type II, hypertension, congestive heart failure, who was admitted to [facility] on 12/25/2020 for Cardiogenic shock ([place]) [R57.0] Flash pulmonary edema ([place]) [J81.0] Cardiac arrest with successful resuscitation [facility]) [I46.9] Cardiopulmonary arrest with successful resuscitation ([place]) [I46.9]  1. Acute kidney injury with hyperkalemia: Proteinuria and hematuria on urinalysis.  Creatinine continues to improve. Chronic kidney disease with unknown baseline creatinine.  Cardiac catheterization 5/7. ATN from hypotensive episode,  Nonoliguric urine output.   Lab Results   Component Value Date   CREATININE 1.35 (H) 01/01/2021   CREATININE  1.56 (H) 12/31/2020   CREATININE 1.72 (H) 12/30/2020   05/13 0701 - 05/14 0700 In: 2366.6 [I.V.:1156.6; NG/GT:1210] Out: 2400 [Urine:2400]   Good UOP and S Creatinine is improving Will sign off Please re consult as necesary  2. Acute Respiratory Failure: requiring intubation and mechanical ventilation. Secondary to flash pulmonary edema from acute exacerbation of systolic and diastolic chronic congestive heart failure and cardiogenic shock from acute ischemic event.         LOS: 7 Orli Degrave 5/14/20229:56 AM

## 2021-01-01 NOTE — Progress Notes (Signed)
NAME:  Jeremy Browning, MRN:  [id number], DOB:  28-Oct-1960, LOS: 7 ADMISSION DATE:  12/25/2020 60 yo M presenting to [facility] ED from home via EMS with respiratory distress that had been progressively worsening over the course of 12/25/20. Per documentation family stated the patient was in his normal state of health until this morning when he began feeling unwell. He then called out stating he could not breathe. EMS found the patient hypoxic and in distress with SpO2 in the 70's on room air. Patient was place on CPAP, arriving to the ED unresponsive and pulseless. ED course: Upon arrival to ED patient was defibrillated x 1 and received CPR before achieving ROSC. Initial EKG suggestive of global ischemia, second EKG had possible LBBB vs IVCD. Patient was emergently intubated requiring mechanical ventilation. Per ED documentation STEMI team activated in the setting of a witnessed shockable cardiac arrest with clinical exam highly suggestive of pulmonary edema with pink frothy sputum throughout ET tubing.  Pertinent Medical History  Obesity Hypertension Diabetes mellitus Chronic renal insufficiency Combined systolic and diastolic heart failure  Significant Hospital Events: Including procedures, antibiotic start and stop dates in addition to other pertinent events   Cardiac catheterization on presentation with normal coronaries, good cardiac output, elevated pulmonary capillary wedge pressure.  5/7 severe resp failure 5/8 remains on Vent 5/9 remains on vent , multiorgan failureMRI shows brain injury 5/10 remains on vent, multiorgan failure, brother updated 5/11 severe resp failure 5/12 failed weaning trials due to severe brain damage 5/13 failed weaning trials severe hypoxia dn vent dyssynchrony, sister updated   MICRO DATA 5/7STAPHYLOCOCCUS HOMINIS1/4 cultures MRSA NEG 5/8 sputum cx pending  COVID And INF A/B NEG  Antibiotics Given (last 72 hours)    Date/Time Action Medication  Dose Rate   12/29/20 0820 New Bag/Given   Ampicillin-Sulbactam (UNASYN) 3 g in sodium chloride 0.9 % 100 mL IVPB 3 g 200 mL/hr   12/29/20 1413 New Bag/Given   Ampicillin-Sulbactam (UNASYN) 3 g in sodium chloride 0.9 % 100 mL IVPB 3 g 200 mL/hr   12/29/20 1938 New Bag/Given   Ampicillin-Sulbactam (UNASYN) 3 g in sodium chloride 0.9 % 100 mL IVPB 3 g 200 mL/hr         Interim History / Subjective:  Remains critically ill +signs of brain damage Prognosis is very poor Remains full code       Objective   Blood pressure (!) 147/51, pulse 67, temperature 98.5 F (36.9 C), resp. rate (!) 35, height 6' 0.01" (1.829 m), weight 117.2 kg, SpO2 100 %.    Vent Mode: PRVC FiO2 (%):  [28 %] 28 % Set Rate:  [16 bmp] 16 bmp Vt Set:  [550 mL] 550 mL PEEP:  [5 cmH20] 5 cmH20 Plateau Pressure:  [21 cmH20] 21 cmH20   Intake/Output Summary (Last 24 hours) at 01/01/2021 0743 Last data filed at 01/01/2021 0600 Gross per 24 hour  Intake 1273.77 ml  Output 2075 ml  Net -801.23 ml   Filed Weights   12/30/20 0450 12/31/20 0312 01/01/21 0500  Weight: 121.6 kg 123.4 kg 117.2 kg      REVIEW OF SYSTEMS  PATIENT IS UNABLE TO PROVIDE COMPLETE REVIEW OF SYSTEMS DUE TO SEVERE CRITICAL ILLNESS AND TOXIC METABOLIC ENCEPHALOPATHY   PHYSICAL EXAMINATION:  GENERAL:critically ill appearing, +resp distress HEAD: Normocephalic, atraumatic.  EYES: Pupils equal, round, reactive to light.  No scleral icterus.  MOUTH: Moist mucosal membrane. NECK: Supple. PULMONARY: +rhonchi, +wheezing CARDIOVASCULAR: S1 and S2. Regular rate and rhythm. No  murmurs, rubs, or gallops.  GASTROINTESTINAL: Soft, nontender, -distended. Positive bowel sounds.  MUSCULOSKELETAL: No swelling, clubbing, or edema.  NEUROLOGIC: obtunded SKIN:intact,warm,dry   Labs/imaging that I havepersonally reviewed  (right click and "Reselect all SmartList Selections" daily)       ASSESSMENT AND PLAN SYNOPSIS    60 yo morbidly  obese AAM with acute and severe cardiac arrest NSTEMI with acute severe systolic cardiac failure with signs and symptoms of brain damage  PATIENT IS NOT IMPROVING, PATIENT WITH MULTIORGAN FAILURE AND VERY POOR CHANCE OF MEANINGFUL RECOVERY  PATIENT IS NOT IMPROVING    Severe ACUTE Hypoxic and Hypercapnic Respiratory Failure -continue Mechanical Ventilator support -continue Bronchodilator Therapy -Wean Fio2 and PEEP as tolerated -VAP/VENT bundle implementation -will perform SAT/SBT when respiratory parameters are met    CARDIAC FAILURE-acute combined systolic/diastolic dysfunction -oxygen as needed -Lasix as tolerated -follow up cardiac enzymes as indicated   CARDIAC ICU monitoring   ACUTE KIDNEY INJURY/Renal Failure -continue Foley Catheter-assess need -Avoid nephrotoxic agents -Follow urine output, BMP -Ensure adequate renal perfusion, optimize oxygenation -Renal dose medications   NEUROLOGY Acute toxic metabolic encephalopathy, need for sedation Goal RASS -2 to -3 Signs of brain damage I have updated Sister, family is waiting for discussion with others to arrive with plan of care Very poor chance of meaningful recovery  INFECTIOUS DISEASE -follow up cultures  ENDO - ICU hypoglycemic\Hyperglycemia protocol -check FSBS per protocol   GI GI PROPHYLAXIS as indicated  NUTRITIONAL STATUS DIET-->TF's as tolerated Constipation protocol as indicated   ELECTROLYTES -follow labs as needed -replace as needed -pharmacy consultation and following     Best practice (right click and "Reselect all SmartList Selections" daily)  Diet:NPO Pain/Anxiety/Delirium protocol (if indicated):Yes(RASS goal -1,-2) VAP protocol (if indicated):Yes DVT prophylaxis:Subcutaneous Heparin GI prophylaxis:H2B Glucose control:SSIYes Central venous access:Yes, and it is still needed Arterial line:Yes, and it is still needed Foley:Yes, and it is still  needed Mobility:bed rest PT consulted:No Code Status:full code Disposition:ICU     Labs   CBC: Recent Labs  Lab 12/25/20 1451 12/26/20 0420 12/28/20 0429 12/29/20 0420 12/30/20 0533 12/31/20 0331 01/01/21 0300  WBC 17.7*   < > 9.1 10.2 9.1 9.2 9.8  NEUTROABS 5.1  --   --  5.7  --   --   --   HGB 14.0   < > 10.0* 10.3* 9.8* 9.3* 9.8*  HCT 46.6   < > 32.2* 32.5* 31.3* 29.7* 31.9*  MCV 83.4   < > 78.7* 78.9* 80.1 80.9 81.0  PLT 171   < > 173 203 203 227 282   < > = values in this interval not displayed.    Basic Metabolic Panel: Recent Labs  Lab 12/28/20 0429 12/29/20 0420 12/30/20 0533 12/31/20 0331 01/01/21 0300  NA 138 141 141 142 139  K 3.8 4.3 4.3 4.3 4.5  CL 112* 113* 112* 116* 114*  CO2 19* 19* 20* 20* 20*  GLUCOSE 146* 124* 117* 131* 137*  BUN 28* 33* 40* 43* 41*  CREATININE 1.82* 1.86* 1.72* 1.56* 1.35*  CALCIUM 8.2* 8.3* 8.2* 8.2* 8.3*  MG 2.3 2.3 2.6* 2.6* 2.5*  PHOS 3.1 4.5 4.8* 4.6 4.1   GFR: Estimated Creatinine Clearance: 76.9 mL/min (A) (by C-G formula based on SCr of 1.35 mg/dL (H)). Recent Labs  Lab 12/25/20 1451 12/25/20 1545 12/25/20 1843 12/26/20 0420 12/27/20 0407 12/28/20 0429 12/29/20 0420 12/30/20 0533 12/31/20 0331 01/01/21 0300  PROCALCITON  --  <0.10  --  33.57 23.40  --   --   --   --   --  WBC 17.7*  --   --  13.2* 8.8   < > 10.2 9.1 9.2 9.8  LATICACIDVEN 7.8*  --  1.9  --   --   --   --   --   --   --    < > = values in this interval not displayed.    Liver Function Tests: Recent Labs  Lab 12/25/20 1545 12/26/20 0420  AST 85* 73*  ALT 58* 46*  ALKPHOS 82 62  BILITOT 0.6 0.8  PROT 7.9 7.4  ALBUMIN 3.2* 3.0*   No results for input(s): LIPASE, AMYLASE in the last 168 hours. No results for input(s): AMMONIA in the last 168 hours.  ABG    Component Value Date/Time   PHART 7.37 12/29/2020 1032   PCO2ART 35 12/29/2020 1032   PO2ART 81 (L) 12/29/2020 1032   HCO3 20.2 12/29/2020 1032   ACIDBASEDEF 4.5  (H) 12/29/2020 1032   O2SAT 95.5 12/29/2020 1032     Coagulation Profile: Recent Labs  Lab 12/25/20 1451  INR 1.2    Cardiac Enzymes: No results for input(s): CKTOTAL, CKMB, CKMBINDEX, TROPONINI in the last 168 hours.  HbA1C: Hgb A1c MFr Bld  Date/Time Value Ref Range Status  12/25/2020 04:46 PM 5.9 (H) 4.8 - 5.6 % Final    Comment:    (NOTE) Pre diabetes:          5.7%-6.4%  Diabetes:              >6.4%  Glycemic control for   <7.0% adults with diabetes     CBG: Recent Labs  Lab 12/31/20 1543 12/31/20 1948 12/31/20 2314 01/01/21 0341 01/01/21 0649  GLUCAP 93 99 91 100* 103*    Allergies Not on File     DVT/GI PRX  assessed I Assessed the need for Labs I Assessed the need for Foley I Assessed the need for Central Venous Line Family Discussion when available I Assessed the need for Mobilization I made an Assessment of medications to be adjusted accordingly Safety Risk assessment completed  CASE DISCUSSED IN MULTIDISCIPLINARY ROUNDS WITH ICU TEAM     Critical Care Time devoted to patient care services described in this note is 60 minutes.  Critical care was necessary to treat or prevent imminent or life-threatening deterioration. Overall, patient is critically ill, prognosis is guarded.  Patient with Multiorgan failure and at high risk for cardiac arrest and death.    Corrin Parker, M.D.  [facility]  Medical Director [place] Director [facility] Cardio-Pulmonary Department

## 2021-01-01 NOTE — Progress Notes (Signed)
Patients sedation reduced 50% this am. Patient started to have increased abdominal breathing with dysynchronous breathing with vent. Please refer to assessment flow sheet. Sedation turned off for neurology assessment this afternoon. Tube feeds infusing. Foley intact. Continue to assess.

## 2021-01-01 NOTE — Progress Notes (Signed)
Neurology Progress Note  Patient ID: 60 yo man with hx obesity, diabetes, hypertension, combined systolic and diastolic heart failure presented to ED on May 7 in respiratory distress c/b NSTEMI and subsequently went into cardiac arrest in ED.  Neurology is consulted for prognostication after cardiac arrest  Interval events: - NAEON - Patient continues on sedation vent mgmt - GOC discussions continue; palliative care is involved - no new developments  Exam: Vitals:   01/01/21 1400 01/01/21 1500  BP: (!) 155/50 (!) 150/52  Pulse: 66 67  Resp: 20 (!) 21  Temp:    SpO2: 100% 100%   Physical Exam Gen: obtunded HEENT: Atraumatic, normocephalic Resp: CTAB, no w/r/r CV: RRR, no m/g/r Abd: soft/NT/ND Extrem: marked edema BLE>BLE  Sedation was paused for 30 min prior to today's exam  Neuro: *MS: obtunded, occasionally stirs briefly in response to noxious stimuli *Speech: intubated, no attempt to speak *CN: PERRL, (+) corneals, oculocephalics, gag, cough *Motor & sensory: no spontaneous movement or movement to command of any extremity. Minimal withdrawal to noxious stimuli in RUE. No response to noxious stimuli in LUE. Withdrawal to noxious stimuli in both RLE and LLE.  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 No electrographic seizures were identified.  Impression: 60 yo man w/ hx obesity, DM2, HTN, systolic and diastolic HF p/w respiratory distress in setting  of NSTEMI with acute severe systolic heart failure now s/p cardiac arrest 5/7 with signs and symptoms of brain damage. Did not undergo TTM. While his brainstem reflexes remain intact, his exam off all sedation is consistent with severe hypoxic ischemic damage and has not improved at all over the past 5 days. His prognosis for a meaningful recovery is extremely poor. GOC discussions are ongoing.   Will continue to follow.    Bing Neighbors, MD [facility] [phone]  If 7pm- 7am, please page neurology on call as listed in [place].

## 2021-01-01 NOTE — Progress Notes (Signed)
PHARMACY CONSULT NOTE - FOLLOW UP  Pharmacy Consult for Electrolyte Monitoring and Replacement   Recent Labs: Potassium (mmol/L)  Date Value  01/01/2021 4.5   Magnesium (mg/dL)  Date Value  29/51/8841 2.5 (H)   Calcium (mg/dL)  Date Value  66/01/3015 8.3 (L)   Albumin (g/dL)  Date Value  08/29/3233 3.0 (L)   Phosphorus (mg/dL)  Date Value  57/32/2025 4.1   Sodium (mmol/L)  Date Value  01/01/2021 139   Corrected calcium = 9.1 mg/dL  Assessment:  60 year old male with obesity, diabetes mellitus, hypertension, and combined diastolic and systolic heart failure who presented to the emergency department yesterday in respiratory distress admitted following witnessed cardiac arrest. Pt is S/P CPR intubation. Pt with AKI on suspected CKD insetting of shock; nephrology following. Pharmacy consulted for electrolyte monitoring and replacement.  Lasix drip started 5/7 evening and discontinued 5/8. PROSource 39mL TID Vital High Protein @25ml /hr Free water 53mL q4h  Goal of Therapy:  Electrolytes WNL  Plan:   No replacement indicated at this time  Will follow with AM labs and replace as needed.  31m, PharmD, BCPS Clinical Pharmacist 01/01/2021 10:00 AM

## 2021-01-02 DIAGNOSIS — I469 Cardiac arrest, cause unspecified: Secondary | ICD-10-CM | POA: Diagnosis not present

## 2021-01-02 LAB — BASIC METABOLIC PANEL
Anion gap: 7 (ref 5–15)
BUN: 38 mg/dL — ABNORMAL HIGH (ref 6–20)
CO2: 20 mmol/L — ABNORMAL LOW (ref 22–32)
Calcium: 8.4 mg/dL — ABNORMAL LOW (ref 8.9–10.3)
Chloride: 114 mmol/L — ABNORMAL HIGH (ref 98–111)
Creatinine, Ser: 1.44 mg/dL — ABNORMAL HIGH (ref 0.61–1.24)
GFR, Estimated: 56 mL/min — ABNORMAL LOW (ref >60)
Glucose, Bld: 118 mg/dL — ABNORMAL HIGH (ref 70–99)
Potassium: 4.6 mmol/L (ref 3.5–5.1)
Sodium: 141 mmol/L (ref 135–145)

## 2021-01-02 LAB — CBC
HCT: 30.3 % — ABNORMAL LOW (ref 39.0–52.0)
Hemoglobin: 9.4 g/dL — ABNORMAL LOW (ref 13.0–17.0)
MCH: 25 pg — ABNORMAL LOW (ref 26.0–34.0)
MCHC: 31 g/dL (ref 30.0–36.0)
MCV: 80.6 fL (ref 80.0–100.0)
Platelets: 321 10*3/uL (ref 150–400)
RBC: 3.76 MIL/uL — ABNORMAL LOW (ref 4.22–5.81)
RDW: 17.2 % — ABNORMAL HIGH (ref 11.5–15.5)
WBC: 9.3 10*3/uL (ref 4.0–10.5)
nRBC: 0.2 % (ref 0.0–0.2)

## 2021-01-02 LAB — GLUCOSE, CAPILLARY
Glucose-Capillary: 101 mg/dL — ABNORMAL HIGH (ref 70–99)
Glucose-Capillary: 102 mg/dL — ABNORMAL HIGH (ref 70–99)
Glucose-Capillary: 109 mg/dL — ABNORMAL HIGH (ref 70–99)
Glucose-Capillary: 120 mg/dL — ABNORMAL HIGH (ref 70–99)
Glucose-Capillary: 120 mg/dL — ABNORMAL HIGH (ref 70–99)
Glucose-Capillary: 99 mg/dL (ref 70–99)

## 2021-01-02 LAB — PHOSPHORUS: Phosphorus: 4.4 mg/dL (ref 2.5–4.6)

## 2021-01-02 LAB — MAGNESIUM: Magnesium: 2.5 mg/dL — ABNORMAL HIGH (ref 1.7–2.4)

## 2021-01-02 NOTE — Plan of Care (Signed)
  Problem: Clinical Measurements: Goal: Ability to maintain clinical measurements within normal limits will improve Outcome: Not Progressing Goal: Will remain free from infection Outcome: Not Progressing Goal: Diagnostic test results will improve Outcome: Not Progressing Goal: Respiratory complications will improve Outcome: Not Progressing Goal: Cardiovascular complication will be avoided Outcome: Not Progressing   Problem: Nutrition: Goal: Adequate nutrition will be maintained Outcome: Not Progressing   Problem: Elimination: Goal: Will not experience complications related to bowel motility Outcome: Not Progressing Goal: Will not experience complications related to urinary retention Outcome: Not Progressing   Problem: Pain Managment: Goal: General experience of comfort will improve Outcome: Not Progressing   Problem: Safety: Goal: Ability to remain free from injury will improve Outcome: Not Progressing   Problem: Skin Integrity: Goal: Risk for impaired skin integrity will decrease Outcome: Not Progressing   Problem: Activity: Goal: Ability to tolerate increased activity will improve Outcome: Not Progressing   Problem: Respiratory: Goal: Ability to maintain a clear airway and adequate ventilation will improve Outcome: Not Progressing   Problem: Role Relationship: Goal: Method of communication will improve Outcome: Not Progressing   

## 2021-01-02 NOTE — Progress Notes (Addendum)
PHARMACY CONSULT NOTE - FOLLOW UP  Pharmacy Consult for Electrolyte Monitoring and Replacement   Recent Labs: Potassium (mmol/L)  Date Value  01/02/2021 4.6   Magnesium (mg/dL)  Date Value  96/22/2979 2.5 (H)   Calcium (mg/dL)  Date Value  89/21/1941 8.4 (L)   Albumin (g/dL)  Date Value  74/03/1447 3.0 (L)   Phosphorus (mg/dL)  Date Value  18/56/3149 4.4   Sodium (mmol/L)  Date Value  01/02/2021 141   Corrected calcium = 9.2 mg/dL  Assessment:  60 year old male with obesity, diabetes mellitus, hypertension, and combined diastolic and systolic heart failure who presented to the emergency department yesterday in respiratory distress admitted following witnessed cardiac arrest. Pt is S/P CPR intubation. Pt with AKI on suspected CKD insetting of shock; nephrology following. Pharmacy consulted for electrolyte monitoring and replacement.  Scr ~ 1.4  Lasix drip started 5/7 evening and discontinued 5/8. PROSource 86mL TID Vital High Protein @25ml /hr Free water 76mL q4h  Goal of Therapy:  Electrolytes WNL  Plan:   No replacement indicated at this time  Will follow with AM labs and replace as needed.  31m, PharmD, BCPS Clinical Pharmacist 01/02/2021 10:20 AM

## 2021-01-02 NOTE — Progress Notes (Signed)
Unable to pass suction catheter through ETT. RT notified and requested. Will continue to monitor.

## 2021-01-02 NOTE — Progress Notes (Signed)
Patient had a 14 beat run of Vtach. BP remained stable post event. Britton-Lee, NP made aware. Will continue to monitor.

## 2021-01-02 NOTE — Progress Notes (Signed)
NAME:  Jeremy Browning, MRN:  [id number], DOB:  [birth year], LOS: 8 ADMISSION DATE:  12/25/2020 60 yo M presenting to [facility] ED from home via EMS with respiratory distress that had been progressively worsening over the course of 12/25/20. Per documentation family stated the patient was in his normal state of health until this morning when he began feeling unwell. He then called out stating he could not breathe. EMS found the patient hypoxic and in distress with SpO2 in the 70's on room air. Patient was place on CPAP, arriving to the ED unresponsive and pulseless. ED course: Upon arrival to ED patient was defibrillated x 1 and received CPR before achieving ROSC. Initial EKG suggestive of global ischemia, second EKG had possible LBBB vs IVCD. Patient was emergently intubated requiring mechanical ventilation. Per ED documentation STEMI team activated in the setting of a witnessed shockable cardiac arrest with clinical exam highly suggestive of pulmonary edema with pink frothy sputum throughout ET tubing.  Pertinent Medical History  Obesity Hypertension Diabetes mellitus Chronic renal insufficiency Combined systolic and diastolic heart failure  Significant Hospital Events: Including procedures, antibiotic start and stop dates in addition to other pertinent events   Cardiac catheterization on presentation with normal coronaries, good cardiac output, elevated pulmonary capillary wedge pressure.  5/7 severe resp failure 5/8 remains on Vent 5/9 remains on vent , multiorgan failureMRI shows brain injury 5/10 remains on vent, multiorgan failure, brother updated 5/11 severe resp failure 5/12 failed weaning trials due to severe brain damage 5/13 failed weaning trials severe hypoxia dn vent dyssynchrony, sister updated 5/14 vent support, severe brain damage, prognosis is poor   MICRO DATA 5/7STAPHYLOCOCCUS HOMINIS1/4 cultures MRSA NEG 5/8 sputum cx pending  COVID And INF A/B  NEG  Antibiotics Given (last 72 hours)    None        Interim History / Subjective:  MULTIPLE ATTEMPTS TO REACH FAMILY WITHOUT SUCCESS FAMILY STATES THAT THEY WOULD BE AT BEDSIDE AND DID NOT SHOW UP LAST SEVERAL DAYS  Remains critically ill Prognosis is very poor +multiorgan failure, cardiac,lungs, brain, kidneys       Objective   Blood pressure (!) 114/46, pulse 64, temperature 98.3 F (36.8 C), temperature source Axillary, resp. rate 19, height 6' 0.01" (1.829 m), weight 126.6 kg, SpO2 100 %.    Vent Mode: PRVC FiO2 (%):  [28 %] 28 % Set Rate:  [16 bmp] 16 bmp Vt Set:  [550 mL-558 mL] 550 mL PEEP:  [5 cmH20] 5 cmH20 Plateau Pressure:  [21 cmH20-22 cmH20] 21 cmH20   Intake/Output Summary (Last 24 hours) at 01/02/2021 0720 Last data filed at 01/02/2021 0529 Gross per 24 hour  Intake 2172.64 ml  Output 2725 ml  Net -552.36 ml   Filed Weights   12/31/20 0312 01/01/21 0500 01/02/21 0419  Weight: 123.4 kg 117.2 kg 126.6 kg      REVIEW OF SYSTEMS  PATIENT IS UNABLE TO PROVIDE COMPLETE REVIEW OF SYSTEMS DUE TO SEVERE CRITICAL ILLNESS AND TOXIC METABOLIC ENCEPHALOPATHY   PHYSICAL EXAMINATION:  GENERAL:critically ill appearing, +resp distress HEAD: Normocephalic, atraumatic.  EYES: Pupils equal, round, reactive to light.  No scleral icterus.  MOUTH: Moist mucosal membrane. NECK: Supple. PULMONARY: +rhonchi, +wheezing CARDIOVASCULAR: S1 and S2. Regular rate and rhythm. No murmurs, rubs, or gallops.  GASTROINTESTINAL: Soft, nontender, -distended. Positive bowel sounds.  MUSCULOSKELETAL: No swelling, clubbing, or edema.  NEUROLOGIC: obtunded SKIN:intact,warm,dry   Labs/imaging that I havepersonally reviewed  (right click and "Reselect all SmartList Selections" daily)  ASSESSMENT AND PLAN SYNOPSIS    59 yo morbidly obese AAM with acute and severe cardiac arrest NSTEMI with acute severe systolic cardiac failure with signs and symptoms of brain  damage  PATIENT IS NOT IMPROVING, PATIENT WITH MULTIORGAN FAILURE AND VERY POOR CHANCE OF MEANINGFUL RECOVERY  PATIENT IS NOT IMPROVING   Severe ACUTE Hypoxic and Hypercapnic Respiratory Failure -continue Mechanical Ventilator support -continue Bronchodilator Therapy -Wean Fio2 and PEEP as tolerated -VAP/VENT bundle implementation   CARDIAC FAILURE-acute systolic dysfunction -oxygen as needed -Lasix as tolerated -follow up cardiac enzymes as indicated   CARDIAC ICU monitoring   ACUTE KIDNEY INJURY/Renal Failure -continue Foley Catheter-assess need -Avoid nephrotoxic agents -Follow urine output, BMP -Ensure adequate renal perfusion, optimize oxygenation -Renal dose medications   NEUROLOGY Acute toxic metabolic encephalopathy, need for sedation Goal RASS -2 to -3 Severe brain damage Prognosis is very poor Follow up Neuro recs    ENDO - ICU hypoglycemic\Hyperglycemia protocol -check FSBS per protocol   GI GI PROPHYLAXIS as indicated  NUTRITIONAL STATUS DIET-->TF's as tolerated Constipation protocol as indicated   ELECTROLYTES -follow labs as needed -replace as needed -pharmacy consultation and following      Best practice (right click and "Reselect all SmartList Selections" daily)  Diet:NPO Pain/Anxiety/Delirium protocol (if indicated):Yes(RASS goal -1,-2) VAP protocol (if indicated):Yes DVT prophylaxis:Subcutaneous Heparin GI prophylaxis:H2B Glucose control:SSIYes Central venous access:Yes, and it is still needed Arterial line:Yes, and it is still needed Foley:Yes, and it is still needed Mobility:bed rest PT consulted:No Code Status:full code Disposition:ICU   Labs   CBC: Recent Labs  Lab 12/29/20 0420 12/30/20 0533 12/31/20 0331 01/01/21 0300 01/02/21 0406  WBC 10.2 9.1 9.2 9.8 9.3  NEUTROABS 5.7  --   --   --   --   HGB 10.3* 9.8* 9.3* 9.8* 9.4*  HCT 32.5* 31.3* 29.7* 31.9* 30.3*  MCV 78.9* 80.1  80.9 81.0 80.6  PLT 203 203 227 282 321    Basic Metabolic Panel: Recent Labs  Lab 12/29/20 0420 12/30/20 0533 12/31/20 0331 01/01/21 0300 01/02/21 0406  NA 141 141 142 139 141  K 4.3 4.3 4.3 4.5 4.6  CL 113* 112* 116* 114* 114*  CO2 19* 20* 20* 20* 20*  GLUCOSE 124* 117* 131* 137* 118*  BUN 33* 40* 43* 41* 38*  CREATININE 1.86* 1.72* 1.56* 1.35* 1.44*  CALCIUM 8.3* 8.2* 8.2* 8.3* 8.4*  MG 2.3 2.6* 2.6* 2.5* 2.5*  PHOS 4.5 4.8* 4.6 4.1 4.4   GFR: Estimated Creatinine Clearance: 75 mL/min (A) (by C-G formula based on SCr of 1.44 mg/dL (H)). Recent Labs  Lab 12/27/20 0407 12/28/20 0429 12/30/20 0533 12/31/20 0331 01/01/21 0300 01/02/21 0406  PROCALCITON 23.40  --   --   --   --   --   WBC 8.8   < > 9.1 9.2 9.8 9.3   < > = values in this interval not displayed.    Liver Function Tests: No results for input(s): AST, ALT, ALKPHOS, BILITOT, PROT, ALBUMIN in the last 168 hours. No results for input(s): LIPASE, AMYLASE in the last 168 hours. No results for input(s): AMMONIA in the last 168 hours.  ABG    Component Value Date/Time   PHART 7.37 12/29/2020 1032   PCO2ART 35 12/29/2020 1032   PO2ART 81 (L) 12/29/2020 1032   HCO3 20.2 12/29/2020 1032   ACIDBASEDEF 4.5 (H) 12/29/2020 1032   O2SAT 95.5 12/29/2020 1032     Coagulation Profile: No results for input(s): INR, PROTIME in the last 168  hours.  Cardiac Enzymes: No results for input(s): CKTOTAL, CKMB, CKMBINDEX, TROPONINI in the last 168 hours.  HbA1C: Hgb A1c MFr Bld  Date/Time Value Ref Range Status  12/25/2020 04:46 PM 5.9 (H) 4.8 - 5.6 % Final    Comment:    (NOTE) Pre diabetes:          5.7%-6.4%  Diabetes:              >6.4%  Glycemic control for   <7.0% adults with diabetes     CBG: Recent Labs  Lab 01/01/21 1102 01/01/21 1541 01/01/21 1938 01/01/21 2346 01/02/21 0403  GLUCAP 120* 120* 127* 124* 109*    Allergies Not on File     DVT/GI PRX  assessed I Assessed the need for  Labs I Assessed the need for Foley I Assessed the need for Central Venous Line Family Discussion when available I Assessed the need for Mobilization I made an Assessment of medications to be adjusted accordingly Safety Risk assessment completed  CASE DISCUSSED IN MULTIDISCIPLINARY ROUNDS WITH ICU TEAM     Critical Care Time devoted to patient care services described in this note is 60 minutes.  Critical care was necessary to treat or prevent imminent or life-threatening deterioration. Overall, patient is critically ill, prognosis is guarded.  Patient with Multiorgan failure and at high risk for cardiac arrest and death.   PROGNOSIS IS VERY POOR RECOMMEND DNR STATUS PATIENT HAS VERY POOR CHANCE OF MEANINGFUL RECOVERY   Dyamon Sosinski Santiago Glad, M.D.  [facility]  Medical Director [facility] Medical Director [facility] Cardio-Pulmonary Department

## 2021-01-02 NOTE — Progress Notes (Addendum)
GOALS OF CARE DISCUSSION  The Clinical status was relayed to family in detail. Brother and Sister in [place] at Bedside Updated and notified of patients medical condition.    Patient remains unresponsive and will not open eyes to command.   Patient is having a weak cough and struggling to remove secretions.   Patient with increased WOB and using accessory muscles to breathe Explained to family course of therapy and the modalities  Patient is suffering and in the dying process  Patient with Progressive multiorgan failure with a very high probablity of a very minimal chance of meaningful recovery despite all aggressive and optimal medical therapy.  PATIENT REMAINS FULL CODE  Family understands the situation. They would like several more days to decide plan of care I will be back in a few days based on my schedule and family is very comfortable talking with me.   Family are satisfied with Plan of action and management. All questions answered  Additional CC time 35 mins   Angelynn Lemus Santiago Glad, M.D.  [facility]  Medical Director [facility] Medical Director [facility] Cardio-Pulmonary Department

## 2021-01-02 NOTE — Progress Notes (Addendum)
Neurology Progress Note  Patient ID: 60 yo man with hx obesity, diabetes, hypertension, combined systolic and diastolic heart failure presented to ED on May 7 in respiratory distress c/b NSTEMI and subsequently went into cardiac arrest in ED.  Neurology is consulted for prognostication after cardiac arrest  Interval events: - NAEON - Patient continues on sedation for vent mgmt - Dr. Mortimer Fries and I met with brother and sister in law at length today to update them on pt's condition and the fact that he has progressive multiorgan failure with an extremely low probability of meaningful recovery. As of now patient remains full code and family would like several more days to discuss this.   Exam: Vitals:   01/02/21 1300 01/02/21 1400  BP: (!) 146/55 (!) 161/60  Pulse: 64 74  Resp: (!) 21 (!) 30  Temp:    SpO2: 100% 100%   Physical Exam Gen: obtunded HEENT: Atraumatic, normocephalic Resp: CTAB, no w/r/r CV: RRR, no m/g/r Abd: soft/NT/ND Extrem: marked edema BLE>BLE  Sedation not paused for today's exam; see yesterday's exam for evaluation off sedation. On sedation exam remains unchanged:  Neuro: *MS: obtunded, occasionally stirs briefly in response to noxious stimuli *Speech: intubated, no attempt to speak *CN: PERRL, (+) corneals, oculocephalics, gag, cough *Motor & sensory: no spontaneous movement or movement to command of any extremity. Minimal withdrawal to noxious stimuli in RUE. No response to noxious stimuli in LUE. Withdrawal to noxious stimuli in both RLE and LLE.  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 No electrographic seizures were identified.  Impression: 60 yo man w/ hx obesity, DM2, HTN, systolic and diastolic HF p/w respiratory distress in setting of NSTEMI with acute severe systolic heart failure now s/p cardiac arrest 5/7 with signs and symptoms of brain damage. Did not undergo TTM. While his brainstem reflexes remain intact, his exam off all sedation is consistent with severe hypoxic ischemic damage and has not improved at all over the past week. His prognosis for a meaningful recovery is extremely poor. [place] discussions are ongoing.   Will continue to follow.    Jeremy Monks, MD [facility] [phone]  If 7pm- 7am, please page neurology on call as listed in [place].  This patient is critically ill and at significant risk of neurological worsening, death and care requires constant monitoring of vital signs, hemodynamics,respiratory and cardiac monitoring, neurological assessment, discussion with family, other specialists and medical decision making of high complexity. I spent 60 minutes of neurocritical care time  in the care of  this patient. This was time spent independent of any time provided by nurse practitioner or PA.  Jeremy Monks, MD [facility] [phone]  If 7pm- 7am, please page neurology on call as listed in [place].

## 2021-01-03 DIAGNOSIS — E872 Acidosis: Secondary | ICD-10-CM

## 2021-01-03 DIAGNOSIS — I469 Cardiac arrest, cause unspecified: Secondary | ICD-10-CM | POA: Diagnosis not present

## 2021-01-03 DIAGNOSIS — I5020 Unspecified systolic (congestive) heart failure: Secondary | ICD-10-CM | POA: Diagnosis not present

## 2021-01-03 DIAGNOSIS — J9601 Acute respiratory failure with hypoxia: Secondary | ICD-10-CM | POA: Diagnosis not present

## 2021-01-03 LAB — MAGNESIUM: Magnesium: 2.3 mg/dL (ref 1.7–2.4)

## 2021-01-03 LAB — GLUCOSE, CAPILLARY
Glucose-Capillary: 92 mg/dL (ref 70–99)
Glucose-Capillary: 108 mg/dL — ABNORMAL HIGH (ref 70–99)
Glucose-Capillary: 105 mg/dL — ABNORMAL HIGH (ref 70–99)
Glucose-Capillary: 104 mg/dL — ABNORMAL HIGH (ref 70–99)
Glucose-Capillary: 106 mg/dL — ABNORMAL HIGH (ref 70–99)
Glucose-Capillary: 96 mg/dL (ref 70–99)

## 2021-01-03 LAB — CBC
HCT: 31.7 % — ABNORMAL LOW (ref 39.0–52.0)
Hemoglobin: 10 g/dL — ABNORMAL LOW (ref 13.0–17.0)
MCH: 24.9 pg — ABNORMAL LOW (ref 26.0–34.0)
MCHC: 31.5 g/dL (ref 30.0–36.0)
MCV: 78.9 fL — ABNORMAL LOW (ref 80.0–100.0)
Platelets: 393 10*3/uL (ref 150–400)
RBC: 4.02 MIL/uL — ABNORMAL LOW (ref 4.22–5.81)
RDW: 17.1 % — ABNORMAL HIGH (ref 11.5–15.5)
WBC: 9.9 10*3/uL (ref 4.0–10.5)
nRBC: 0 % (ref 0.0–0.2)

## 2021-01-03 LAB — BASIC METABOLIC PANEL
Anion gap: 6 (ref 5–15)
BUN: 38 mg/dL — ABNORMAL HIGH (ref 6–20)
CO2: 19 mmol/L — ABNORMAL LOW (ref 22–32)
Calcium: 8.6 mg/dL — ABNORMAL LOW (ref 8.9–10.3)
Chloride: 116 mmol/L — ABNORMAL HIGH (ref 98–111)
Creatinine, Ser: 1.37 mg/dL — ABNORMAL HIGH (ref 0.61–1.24)
GFR, Estimated: 59 mL/min — ABNORMAL LOW (ref >60)
Glucose, Bld: 120 mg/dL — ABNORMAL HIGH (ref 70–99)
Potassium: 4.6 mmol/L (ref 3.5–5.1)
Sodium: 141 mmol/L (ref 135–145)

## 2021-01-03 LAB — TRIGLYCERIDES: Triglycerides: 229 mg/dL — ABNORMAL HIGH (ref <150)

## 2021-01-03 LAB — PHOSPHORUS: Phosphorus: 4.4 mg/dL (ref 2.5–4.6)

## 2021-01-03 MED ORDER — ADULT MULTIVITAMIN LIQUID CH
15.0000 mL | Freq: Every day | ORAL | Status: DC
  Administered 2021-01-04 – 2021-01-06 (×3): 15 mL
  Filled 2021-01-03 (×3): qty 15

## 2021-01-03 MED ORDER — PROSOURCE TF PO LIQD
90.0000 mL | Freq: Four times a day (QID) | ORAL | Status: DC
  Administered 2021-01-03 – 2021-01-10 (×29): 90 mL
  Filled 2021-01-03 (×31): qty 90

## 2021-01-03 MED ORDER — VITAL HIGH PROTEIN PO LIQD
1000.0000 mL | ORAL | Status: DC
  Administered 2021-01-03 – 2021-01-05 (×3): 1000 mL

## 2021-01-03 NOTE — Progress Notes (Signed)
Pt became tachypnic and had coughing episode when sedation was decreased this morning. Propofol gtt increased to 35 mcg. Pt is resting now but still has coughing episodes. Pt's pupils equal and reactive. Cough and gag reflex present. Pt will withdraw to pain on lower extremities and withdraws while oral care is being performed. No movement of upper extremities even with painful stimuli. Pt's sister-in-law called this afternoon and was updated on pt's condition.

## 2021-01-03 NOTE — Progress Notes (Addendum)
NAME:  Jeremy Browning, MRN:  [id number], DOB:  05-Sep-1960, LOS: 9 ADMISSION DATE:  12/25/2020 60 yo M presenting to [facility] ED from home via EMS with respiratory distress that had been progressively worsening over the course of 12/25/20. Per documentation family stated the patient was in his normal state of health until this morning when he began feeling unwell. He then called out stating he could not breathe. EMS found the patient hypoxic and in distress with SpO2 in the 70's on room air. Patient was place on CPAP, arriving to the ED unresponsive and pulseless. ED course: Upon arrival to ED patient was defibrillated x 1 and received CPR before achieving ROSC. Initial EKG suggestive of global ischemia, second EKG had possible LBBB vs IVCD. Patient was emergently intubated requiring mechanical ventilation. Per ED documentation STEMI team activated in the setting of a witnessed shockable cardiac arrest with clinical exam highly suggestive of pulmonary edema with pink frothy sputum throughout ET tubing.  Pertinent Medical History  Obesity Hypertension Diabetes mellitus Chronic renal insufficiency Combined systolic and diastolic heart failure  Significant Hospital Events: Including procedures, antibiotic start and stop dates in addition to other pertinent events   Cardiac catheterization on presentation with normal coronaries, good cardiac output, elevated pulmonary capillary wedge pressure.  5/7 severe resp failure 5/8 remains on Vent 5/9 remains on vent , multiorgan failureMRI shows brain injury 5/10 remains on vent, multiorgan failure, brother updated 5/11 severe resp failure 5/12 failed weaning trials due to severe brain damage 5/13 failed weaning trials severe hypoxia dn vent dyssynchrony, sister updated 5/14 vent support, severe brain damage, prognosis is poor 5/16 unresponsive on wake up assessment, becomes tachypneic when sedation is lightened    MICRO DATA 5/7STAPHYLOCOCCUS  HOMINIS1/4 cultures MRSA NEG 5/8 sputum cx pending  COVID And INF A/B NEG  Antibiotics Given (last 72 hours)    None     Scheduled Meds: . chlorhexidine gluconate (MEDLINE KIT)  15 mL Mouth Rinse BID  . Chlorhexidine Gluconate Cloth  6 each Topical Q0600  . docusate  100 mg Per Tube BID  . enoxaparin (LOVENOX) injection  0.5 mg/kg Subcutaneous Q24H  . famotidine  20 mg Per Tube Daily  . feeding supplement (PROSource TF)  90 mL Per Tube QID  . feeding supplement (VITAL HIGH PROTEIN)  1,000 mL Per Tube Q24H  . free water  30 mL Per Tube Q4H  . insulin aspart  0-15 Units Subcutaneous Q4H  . mouth rinse  15 mL Mouth Rinse 10 times per day  . [START ON 01/04/2021] multivitamin  15 mL Per Tube Daily  . polyethylene glycol  17 g Per Tube Daily  . sodium chloride flush  10-40 mL Intracatheter Q12H  . sodium chloride flush  3 mL Intravenous Q12H   Continuous Infusions: . sodium chloride 5 mL/hr at 01/03/21 1210  . sodium chloride    . norepinephrine (LEVOPHED) Adult infusion Stopped (12/31/20 1250)  . propofol (DIPRIVAN) infusion 35 mcg/kg/min (01/03/21 1246)   PRN Meds:.sodium chloride, acetaminophen (TYLENOL) oral liquid 160 mg/5 mL, albuterol, docusate, fentaNYL (SUBLIMAZE) injection, hydrALAZINE, midazolam, polyethylene glycol, sodium chloride flush, sodium chloride flush    Interim History / Subjective:  All family members afflicted with [id number] Family members self quarantining  Remains critically ill Prognosis is very poor, no response on wake-up assessment +multiorgan failure, cardiac,lungs, brain, kidneys    Objective   Blood pressure (!) 116/44, pulse 71, temperature 99.2 F (37.3 C), temperature source Axillary, resp. rate 11, height 6' 0.75" (  1.848 m), weight 127.9 kg, SpO2 98 %.    Vent Mode: PRVC FiO2 (%):  [28 %] 28 % Set Rate:  [16 bmp] 16 bmp Vt Set:  [550 mL] 550 mL PEEP:  [5 cmH20] 5 cmH20   Intake/Output Summary (Last 24 hours) at 01/03/2021  1523 Last data filed at 01/03/2021 1210 Gross per 24 hour  Intake 944.64 ml  Output 2115 ml  Net -1170.36 ml   Filed Weights   01/01/21 0500 01/02/21 0419 01/03/21 0338  Weight: 117.2 kg 126.6 kg 127.9 kg      REVIEW OF SYSTEMS  PATIENT IS UNABLE TO PROVIDE COMPLETE REVIEW OF SYSTEMS DUE TO SEVERE CRITICAL ILLNESS AND ACUTE ENCEPHALOPATHY, QUERY ANOXIC ENCEPHALOPATHY.   PHYSICAL EXAMINATION:  GENERAL: Intubated, mechanically ventilated, nonresponsive HEAD: Normocephalic, atraumatic.  EYES: Pupils equal, round, reactive to light.  No scleral icterus.  MOUTH: Orotracheally intubated, OG in place, oral mucosa moist. NECK: Supple.  Trachea midline. PULMONARY: Coarse breath sounds, no wheezes.  No rales.  No rhonchi. CARDIOVASCULAR: S1 and S2. Regular rate and rhythm. No murmurs, rubs, or gallops.  GASTROINTESTINAL: Soft, non-distended. Positive bowel sounds.  MUSCULOSKELETAL: No swelling, clubbing, or edema.  NEUROLOGIC: obtunded/unresponsive. SKIN:intact,warm,dry.   Labs/imaging that I havepersonally reviewed  (right click and "Reselect all SmartList Selections" daily)     ASSESSMENT AND PLAN  SYNOPSIS 60 yo morbidly obese AAM with acute and severe cardiac arrest NSTEMI with acute severe systolic cardiac failure with signs and symptoms of severe anoxic encephalopathy.  PATIENT IS NOT IMPROVING, PATIENT WITH MULTIORGAN FAILURE AND VERY POOR CHANCE OF MEANINGFUL RECOVERY  Severe ACUTE Hypoxic and Hypercapnic Respiratory Failure -continue Mechanical Ventilator support -continue Bronchodilator Therapy -Wean Fio2 and PEEP as tolerated -VAP/VENT bundle implementation   CARDIAC FAILURE-acute systolic dysfunction -Lasix as tolerated -follow up cardiac enzymes as indicated   CARDIAC ICU monitoring   ACUTE KIDNEY INJURY/Renal Failure -continue Foley Catheter-assess need continued use necessary -Avoid nephrotoxic agents -Follow urine output, BMP -Ensure adequate  renal perfusion, optimize oxygenation -Renal dose medications   NEUROLOGY Acute toxic metabolic encephalopathy, need for sedation Goal RASS -2 to -3 Severe anoxic encephalopathy Prognosis is very poor Follow up Neuro recs    ENDO - ICU hypoglycemic\Hyperglycemia protocol -check FSBS per protocol   GI GI PROPHYLAXIS as indicated  NUTRITIONAL STATUS DIET-->TF's as tolerated Constipation protocol as indicated   ELECTROLYTES -follow labs as needed -replace as needed -pharmacy consultation and following      Best practice (right click and "Reselect all SmartList Selections" daily)  Diet:NPO Pain/Anxiety/Delirium protocol (if indicated):Yes(RASS goal -1,-2) VAP protocol (if indicated):Yes DVT prophylaxis:Subcutaneous Heparin GI prophylaxis:H2B Glucose control:SSIYes Central venous access:Yes, and it is still needed Arterial line:Yes, and it is still needed Foley:Yes, and it is still needed Mobility:bed rest PT consulted:No Code Status:full code Disposition:ICU   Labs   CBC: Recent Labs  Lab 12/29/20 0420 12/30/20 0533 12/31/20 0331 01/01/21 0300 01/02/21 0406 01/03/21 0354  WBC 10.2 9.1 9.2 9.8 9.3 9.9  NEUTROABS 5.7  --   --   --   --   --   HGB 10.3* 9.8* 9.3* 9.8* 9.4* 10.0*  HCT 32.5* 31.3* 29.7* 31.9* 30.3* 31.7*  MCV 78.9* 80.1 80.9 81.0 80.6 78.9*  PLT 203 203 227 282 321 003    Basic Metabolic Panel: Recent Labs  Lab 12/30/20 0533 12/31/20 0331 01/01/21 0300 01/02/21 0406 01/03/21 0354  NA 141 142 139 141 141  K 4.3 4.3 4.5 4.6 4.6  CL 112* 116* 114* 114* 116*  CO2 20* 20* 20* 20* 19*  GLUCOSE 117* 131* 137* 118* 120*  BUN 40* 43* 41* 38* 38*  CREATININE 1.72* 1.56* 1.35* 1.44* 1.37*  CALCIUM 8.2* 8.2* 8.3* 8.4* 8.6*  MG 2.6* 2.6* 2.5* 2.5* 2.3  PHOS 4.8* 4.6 4.1 4.4 4.4   GFR: Estimated Creatinine Clearance: 80 mL/min (A) (by C-G formula based on SCr of 1.37 mg/dL (H)). Recent Labs  Lab  12/31/20 0331 01/01/21 0300 01/02/21 0406 01/03/21 0354  WBC 9.2 9.8 9.3 9.9    Liver Function Tests: No results for input(s): AST, ALT, ALKPHOS, BILITOT, PROT, ALBUMIN in the last 168 hours. No results for input(s): LIPASE, AMYLASE in the last 168 hours. No results for input(s): AMMONIA in the last 168 hours.  ABG    Component Value Date/Time   PHART 7.37 12/29/2020 1032   PCO2ART 35 12/29/2020 1032   PO2ART 81 (L) 12/29/2020 1032   HCO3 20.2 12/29/2020 1032   ACIDBASEDEF 4.5 (H) 12/29/2020 1032   O2SAT 95.5 12/29/2020 1032     Coagulation Profile: No results for input(s): INR, PROTIME in the last 168 hours.  Cardiac Enzymes: No results for input(s): CKTOTAL, CKMB, CKMBINDEX, TROPONINI in the last 168 hours.  HbA1C: Hgb A1c MFr Bld  Date/Time Value Ref Range Status  12/25/2020 04:46 PM 5.9 (H) 4.8 - 5.6 % Final    Comment:    (NOTE) Pre diabetes:          5.7%-6.4%  Diabetes:              >6.4%  Glycemic control for   <7.0% adults with diabetes     CBG: Recent Labs  Lab 01/02/21 1923 01/02/21 2331 01/03/21 0347 01/03/21 0736 01/03/21 1135  GLUCAP 120* 102* 92 108* 105*    Allergies Not on File     DVT/GI PRX  assessed I Assessed the need for Labs I Assessed the need for Foley I Assessed the need for Central Venous Line Family Discussion when available I Assessed the need for Mobilization I made an Assessment of medications to be adjusted accordingly Safety Risk assessment completed  CASE DISCUSSED IN MULTIDISCIPLINARY ROUNDS WITH ICU TEAM     Critical Care Time devoted to patient care services described in this note is 35 minutes.  Critical care was necessary to treat or prevent imminent or life-threatening deterioration. Overall, patient is critically ill, prognosis is guarded.  Patient with multiorgan failure and at high risk for cardiac arrest and death.   PROGNOSIS IS VERY POOR RECOMMEND DNR STATUS/PALLIATIVE CARE CONSULT PATIENT  HAS VERY POOR CHANCE OF MEANINGFUL RECOVERY   C. Derrill Kay, MD [facility]   *This note was dictated using voice recognition software/Dragon.  Despite best efforts to proofread, errors can occur which can change the meaning.  Any change was purely unintentional.

## 2021-01-03 NOTE — Progress Notes (Addendum)
Nutrition Follow Up Note   DOCUMENTATION CODES:   Obesity unspecified  INTERVENTION:   Increase Vital HP to 61m/hr + Pro-Source TF 97mQID via tube   Propofol: 26.49m949mr- provides 697kcal/day   Free water flushes 39m349m hours to maintain tube patency   Regimen provides 1857kcal/day, 162g/day protein and 882ml60m free water   Liquid MVI daily via tube   NUTRITION DIAGNOSIS:   Inadequate oral intake related to inability to eat (pt sedated and ventilated) as evidenced by NPO status.  GOAL:   Provide needs based on ASPEN/SCCM guidelines  -met with tube feeds   MONITOR:   Vent status,Labs,Weight trends,TF tolerance,Skin,I & O's  ASSESSMENT:   59 y.65 black male with diabetes mellitus type II, hypertension and congestive heart failure who was admitted to [facility]02/2021 for cardiogenic shock and flash pulmonary edema  Pt sedated and ventilated. OGT in place; pt tolerating tube feeds well at goal rate. Will adjust tube feeds in setting of propofol changes. Pt with brain damage per MD note, palliative care following. Per chart, pt is weight stable since admit.   Medications reviewed and include: colace, lovenox, pepcid, insulin, miralax, propofol   Labs reviewed: K 4.6 wnl, BUN 38(H), creat 1.37(H), P 4.4 wnl, Mg 2.3 wnl Hgb 10.0(L), Hct 31.7(L), MCV 78.9(L), MCH 24.9(L) cbgs- 92, 108, 105 x 24 hrs  Patient is currently intubated on ventilator support MV: 12.9 L/min Temp (24hrs), Avg:99.4 F (37.4 C), Min:98.9 F (37.2 C), Max:100 F (37.8 C)  Propofol: 26.49ml/h47mprovides 697kcal/day   MAP- >65mmHg55mOP- 2215ml   81m Order:   Diet Order            Diet NPO time specified  Diet effective now                EDUCATION NEEDS:   No education needs have been identified at this time  Skin:  Skin Assessment: Reviewed RN Assessment  Last BM:  5/15  Height:   Ht Readings from Last 1 Encounters:  01/02/21 6' 0.75" (1.848 m)    Weight:   Wt  Readings from Last 1 Encounters:  01/03/21 127.9 kg    BMI:  Body mass index is 37.46 kg/m.  Estimated Nutritional Needs:   Kcal:  1406-1790kcal/day  Protein:  >160g/day  Fluid:  2.4-2.7L/day  Jeremy Browning Please refer to [facility]and/or RD on-call/weekend/after hours pager

## 2021-01-03 NOTE — Progress Notes (Signed)
PHARMACY CONSULT NOTE  Pharmacy Consult for Electrolyte Monitoring and Replacement   Recent Labs: Potassium (mmol/L)  Date Value  01/03/2021 4.6   Magnesium (mg/dL)  Date Value  60/73/7106 2.3   Calcium (mg/dL)  Date Value  26/94/8546 8.6 (L)   Albumin (g/dL)  Date Value  27/10/5007 3.0 (L)   Phosphorus (mg/dL)  Date Value  38/18/2993 4.4   Sodium (mmol/L)  Date Value  01/03/2021 141   Corrected calcium:  9.4 mg/dL  Assessment:  60 year old male with obesity, diabetes mellitus, hypertension, and combined diastolic and systolic heart failure who presented to the emergency department yesterday in respiratory distress admitted following witnessed cardiac arrest. Pt is S/P CPR intubation. Pharmacy consulted for electrolyte monitoring and replacement.  PROSource 48mL TID Vital High Protein @25ml /hr Free water 75mL q4h  Goal of Therapy:  Electrolytes WNL  Plan:   No replacement indicated at this time  Will follow with AM labs and replace as needed.  31m, PharmD, BCPS Clinical Pharmacist 01/03/2021 7:15 AM

## 2021-01-04 ENCOUNTER — Inpatient Hospital Stay: Payer: Medicaid Other

## 2021-01-04 ENCOUNTER — Inpatient Hospital Stay: Payer: Self-pay

## 2021-01-04 DIAGNOSIS — N179 Acute kidney failure, unspecified: Secondary | ICD-10-CM | POA: Diagnosis not present

## 2021-01-04 DIAGNOSIS — J9601 Acute respiratory failure with hypoxia: Secondary | ICD-10-CM | POA: Diagnosis not present

## 2021-01-04 DIAGNOSIS — J9602 Acute respiratory failure with hypercapnia: Secondary | ICD-10-CM | POA: Diagnosis not present

## 2021-01-04 DIAGNOSIS — I469 Cardiac arrest, cause unspecified: Secondary | ICD-10-CM | POA: Diagnosis not present

## 2021-01-04 DIAGNOSIS — R570 Cardiogenic shock: Secondary | ICD-10-CM | POA: Diagnosis not present

## 2021-01-04 LAB — RENAL FUNCTION PANEL
Albumin: 2.4 g/dL — ABNORMAL LOW (ref 3.5–5.0)
Anion gap: 8 (ref 5–15)
BUN: 48 mg/dL — ABNORMAL HIGH (ref 6–20)
CO2: 20 mmol/L — ABNORMAL LOW (ref 22–32)
Calcium: 8.7 mg/dL — ABNORMAL LOW (ref 8.9–10.3)
Chloride: 114 mmol/L — ABNORMAL HIGH (ref 98–111)
Creatinine, Ser: 1.5 mg/dL — ABNORMAL HIGH (ref 0.61–1.24)
GFR, Estimated: 53 mL/min — ABNORMAL LOW (ref >60)
Glucose, Bld: 122 mg/dL — ABNORMAL HIGH (ref 70–99)
Phosphorus: 5 mg/dL — ABNORMAL HIGH (ref 2.5–4.6)
Potassium: 4.9 mmol/L (ref 3.5–5.1)
Sodium: 142 mmol/L (ref 135–145)

## 2021-01-04 LAB — T4, FREE: Free T4: 0.6 ng/dL — ABNORMAL LOW (ref 0.61–1.12)

## 2021-01-04 LAB — GLUCOSE, CAPILLARY
Glucose-Capillary: 106 mg/dL — ABNORMAL HIGH (ref 70–99)
Glucose-Capillary: 106 mg/dL — ABNORMAL HIGH (ref 70–99)
Glucose-Capillary: 92 mg/dL (ref 70–99)
Glucose-Capillary: 92 mg/dL (ref 70–99)
Glucose-Capillary: 89 mg/dL (ref 70–99)
Glucose-Capillary: 97 mg/dL (ref 70–99)

## 2021-01-04 LAB — PROCALCITONIN: Procalcitonin: 0.38 ng/mL

## 2021-01-04 LAB — CBC
HCT: 32.4 % — ABNORMAL LOW (ref 39.0–52.0)
Hemoglobin: 9.9 g/dL — ABNORMAL LOW (ref 13.0–17.0)
MCH: 25 pg — ABNORMAL LOW (ref 26.0–34.0)
MCHC: 30.6 g/dL (ref 30.0–36.0)
MCV: 81.8 fL (ref 80.0–100.0)
Platelets: 439 10*3/uL — ABNORMAL HIGH (ref 150–400)
RBC: 3.96 MIL/uL — ABNORMAL LOW (ref 4.22–5.81)
RDW: 17.2 % — ABNORMAL HIGH (ref 11.5–15.5)
WBC: 9.6 10*3/uL (ref 4.0–10.5)
nRBC: 0.2 % (ref 0.0–0.2)

## 2021-01-04 LAB — BRAIN NATRIURETIC PEPTIDE: B Natriuretic Peptide: 25.5 pg/mL (ref 0.0–100.0)

## 2021-01-04 LAB — TSH: TSH: 8.281 u[IU]/mL — ABNORMAL HIGH (ref 0.350–4.500)

## 2021-01-04 LAB — MAGNESIUM: Magnesium: 2.5 mg/dL — ABNORMAL HIGH (ref 1.7–2.4)

## 2021-01-04 IMAGING — CT CT HEAD W/O CM
3 series · 14 of 47 positions shown, 16 images · non-contrast
Comparison: Prior head CT [DATE].  Brain MRI [DATE].

CLINICAL DATA: Stroke, follow-up.

EXAM:
CT HEAD WITHOUT CONTRAST
TECHNIQUE: Contiguous axial images were obtained from the base of the skull
through the vertex without intravenous contrast.

[Series 3: head wo · axial · 0.46mm/px · z∈[-126,+4]mm · 8 of 32 slices shown, 10 images]
[im 3/32  brain]
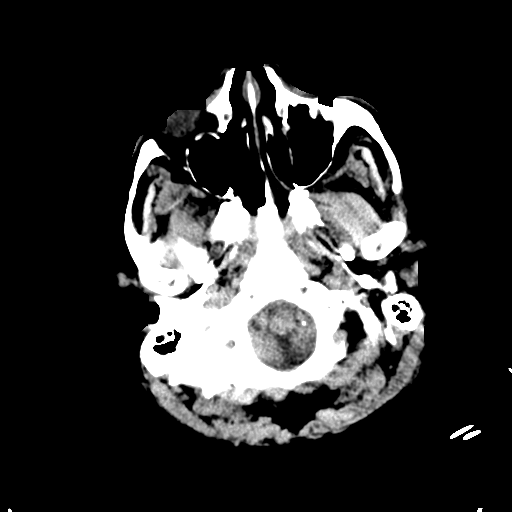
[im 3/32  bone]
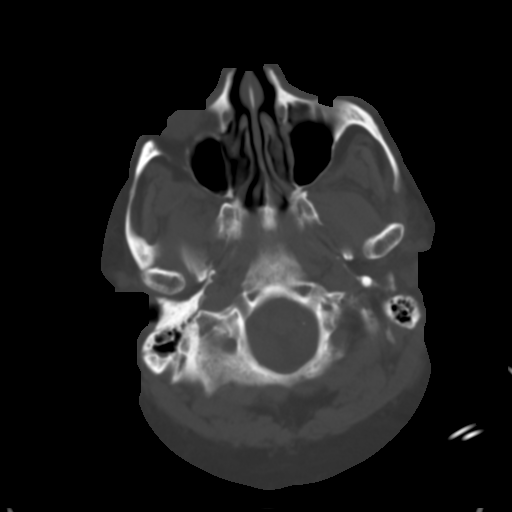
[im 7/32  brain]
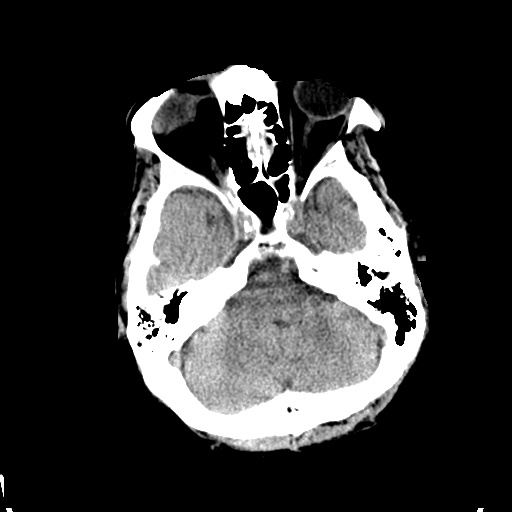
[im 10/32  brain]
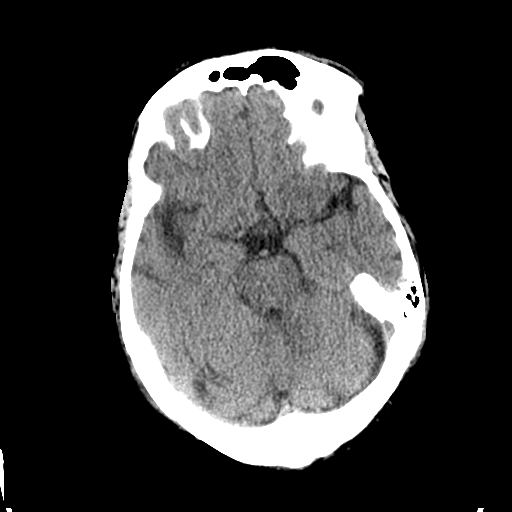
[im 14/32  brain]
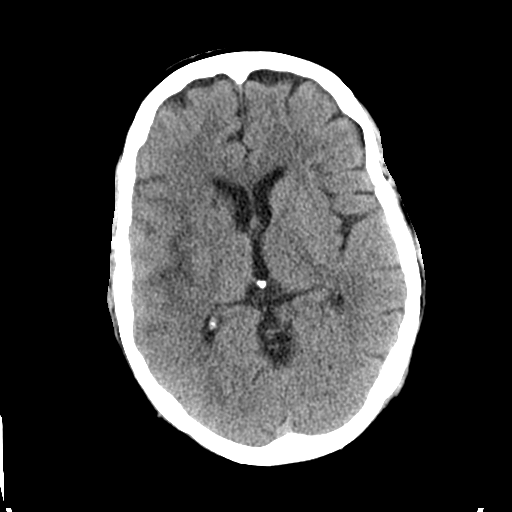
[im 18/32  brain]
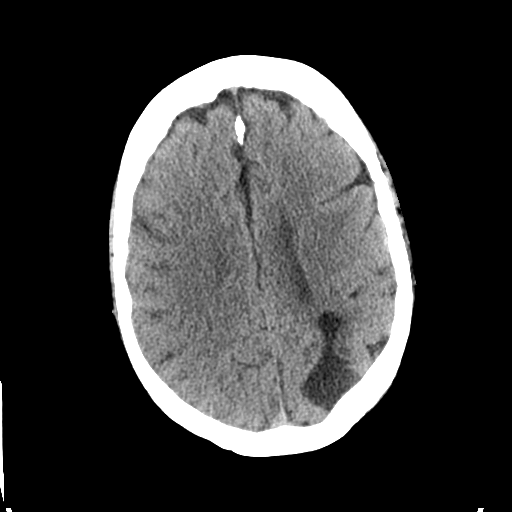
[im 18/32  bone]
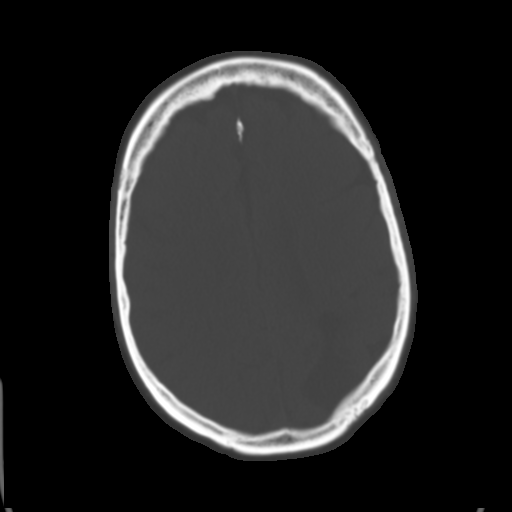
[im 22/32  brain]
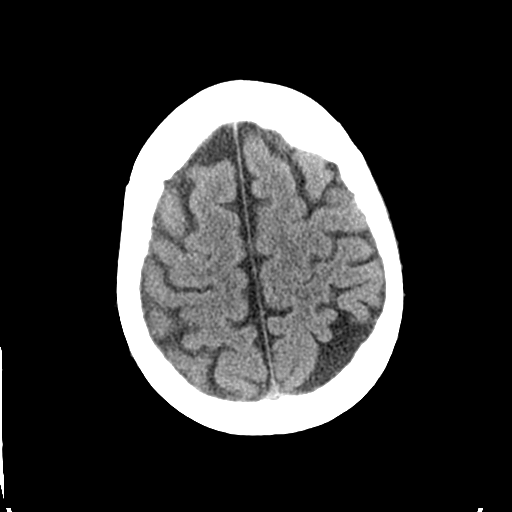
[im 25/32  brain]
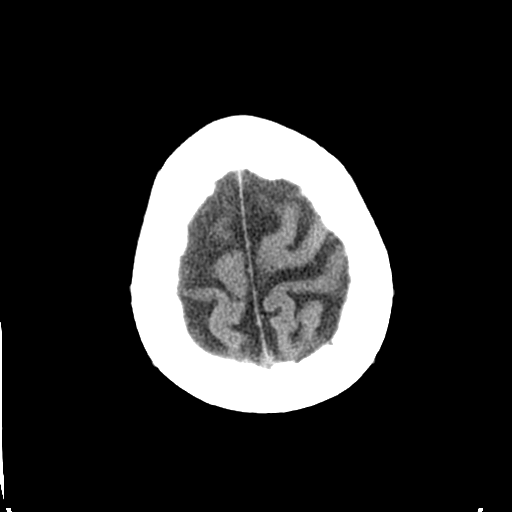
[im 29/32  brain]
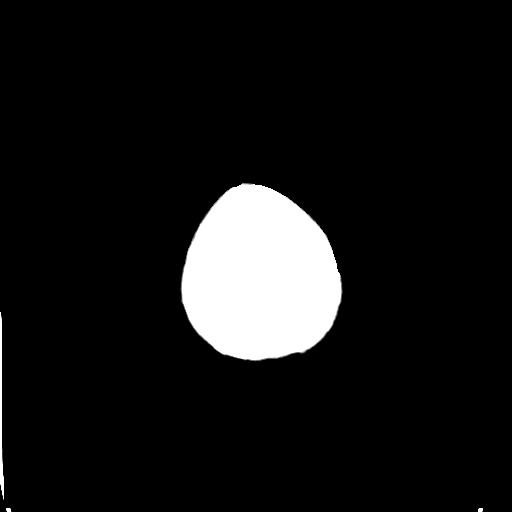

[Series 4: coronal soft tissue · coronal · 0.34mm/px · 3 of 72 slices shown]
[im 24/72  brain]
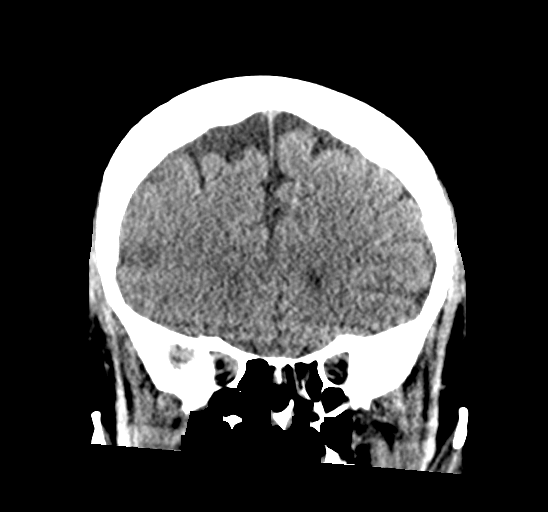
[im 32/72  brain]
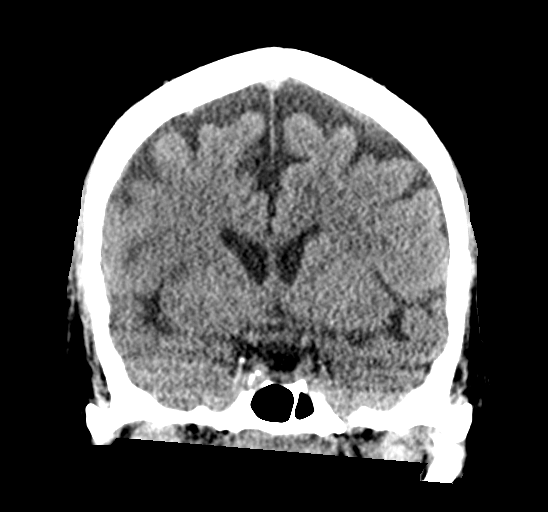
[im 40/72  brain]
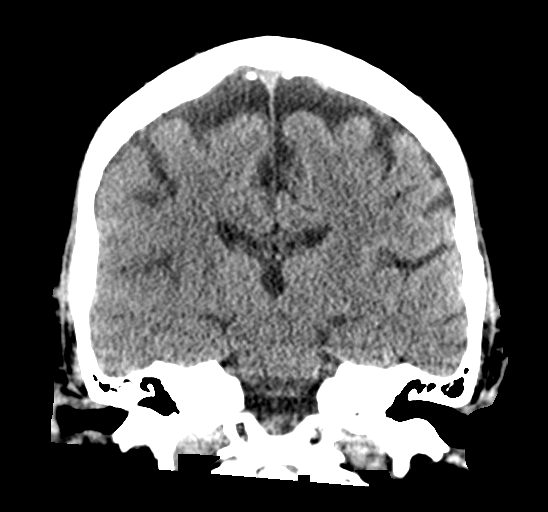

[Series 5: sagittal soft tissue · sagittal · 0.34mm/px · 3 of 56 slices shown]
[im 19/56  brain]
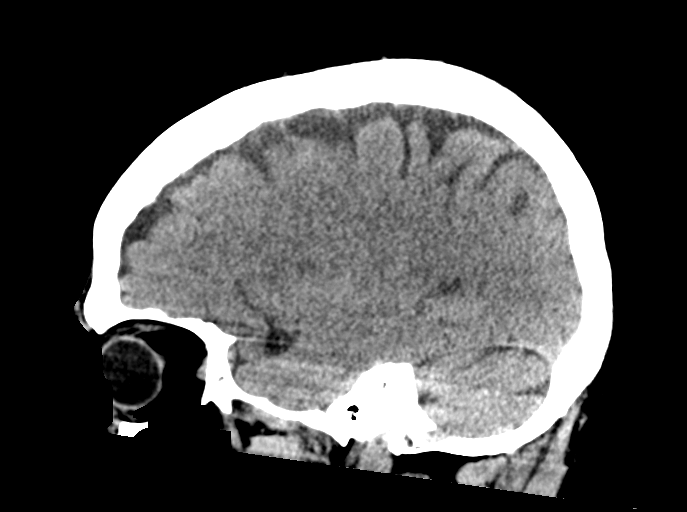
[im 28/56  brain]
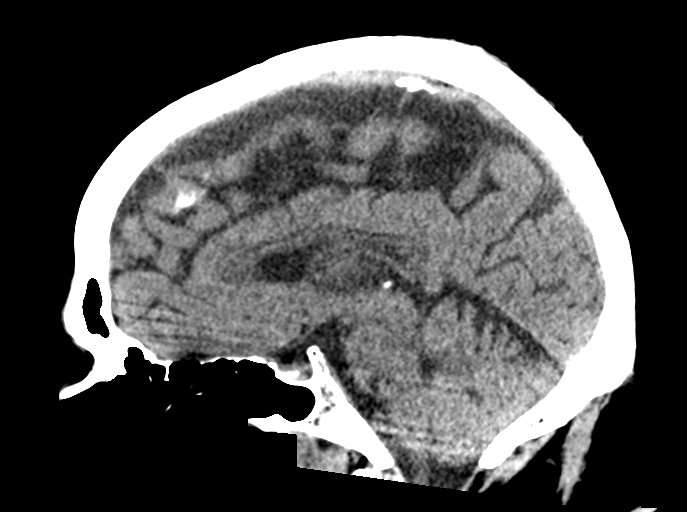
[im 37/56  brain]
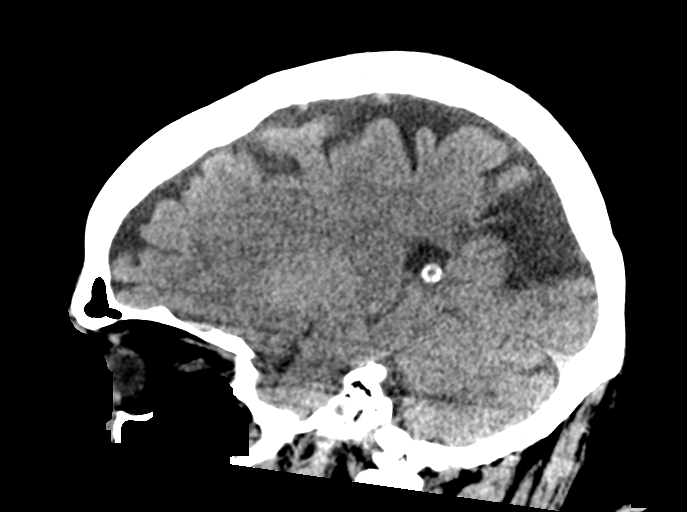

[14 of 47 positions shown; findings below may reference images not displayed]

FINDINGS: Brain:

Mildly motion degraded exam

Cerebral volume is normal for age.

Acute/early subacute infarction changes within the right insula and
along the right sylvian fissure have become more conspicuous as
compared to prior exams. The remaining previously described
acute/early subacute infarction changes were better appreciated on
the prior brain MRI of [DATE].

Redemonstrated chronic left parietooccipital cortical/subcortical
infarct.

Redemonstrated chronic small-vessel infarct within the right basal
ganglia.

Background mild patchy and ill-defined hypoattenuation within the
cerebral white matter, nonspecific but compatible with chronic small
vessel ischemic disease.

There is no acute intracranial hemorrhage.

No extra-axial fluid collection.

No evidence of intracranial mass.

No midline shift.

Vascular: No hyperdense vessel.  Atherosclerotic calcifications

Skull: Normal. Negative for fracture or focal lesion.

Sinuses/Orbits: Visualized orbits show no acute finding. Mild
bilateral ethmoid and sphenoid sinus mucosal thickening. Mucous
retention cyst and mild mucosal thickening within the right
maxillary sinus.

Other: Right mastoid effusion.
IMPRESSION: Mildly motion degraded exam.

Acute/early subacute infarcts within the right insula and along the
right sylvian fissure have become more conspicuous as compared to
prior exams. The remaining previously described acute/early subacute
infarcts were better appreciated on the brain MRI of [DATE].

Redemonstrated chronic infarcts within the right basal ganglia and
left parietooccipital lobes.

Stable mild cerebral white matter chronic small vessel ischemic
disease.

Paranasal sinus disease as described.

Right mastoid effusion.

## 2021-01-04 IMAGING — DX DG CHEST 1V PORT
1 series · 1 of 1 positions shown · non-contrast
Comparison: [DATE]

CLINICAL DATA: Respiratory failure

EXAM:
PORTABLE CHEST 1 VIEW

[chest ap]
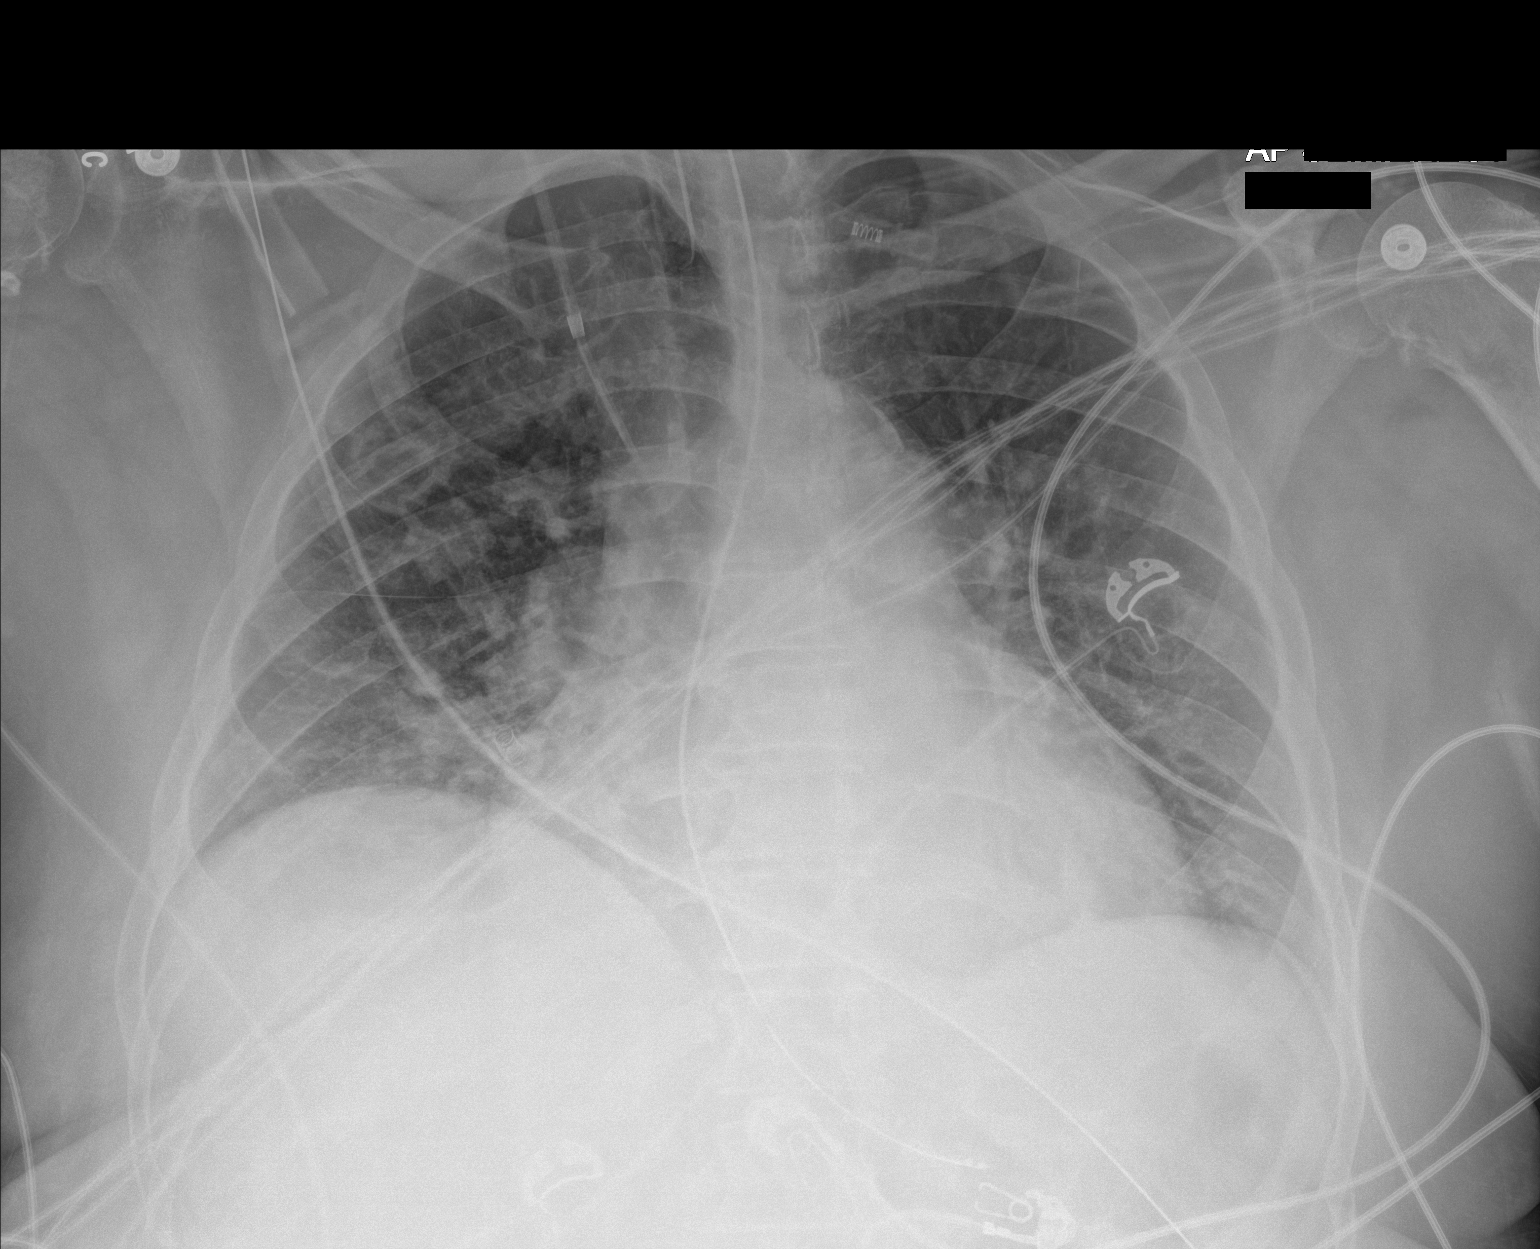

[1 of 1 positions shown; findings below may reference images not displayed]

FINDINGS: Endotracheal tube is seen 7.3 cm above the carina. Nasogastric tube
tip is seen within the gastric cardia with the proximal side hole
positioned at the expected gastroesophageal junction. Right internal
jugular central venous catheter tip noted within the superior vena
cava.

Lung volumes are small, but are symmetric and pulmonary insufflation
remain stable since prior examination. Superimposed bibasilar
pulmonary infiltrate has improved in the interval. Mild residual
bibasilar atelectasis is present. No pneumothorax or pleural
effusion. Cardiac size within normal limits. Pulmonary vascularity
is normal.
IMPRESSION: Stable support lines and tubes.

Resolved bibasilar pulmonary infiltrate. Residual mild bibasilar
atelectasis.

## 2021-01-04 MED ORDER — SODIUM CHLORIDE 0.9% FLUSH
10.0000 mL | INTRAVENOUS | Status: DC | PRN
  Administered 2021-01-28: 10 mL

## 2021-01-04 MED ORDER — ALTEPLASE 2 MG IJ SOLR
2.0000 mg | Freq: Once | INTRAMUSCULAR | Status: AC
  Administered 2021-01-04: 2 mg
  Filled 2021-01-04: qty 2

## 2021-01-04 MED ORDER — SODIUM CHLORIDE 0.9% FLUSH
10.0000 mL | Freq: Two times a day (BID) | INTRAVENOUS | Status: DC
  Administered 2021-01-04 – 2021-01-05 (×2): 10 mL
  Administered 2021-01-05: 20 mL

## 2021-01-04 MED ORDER — STERILE WATER FOR INJECTION IJ SOLN
INTRAMUSCULAR | Status: AC
  Administered 2021-01-04: 10 mL
  Filled 2021-01-04: qty 10

## 2021-01-04 NOTE — Progress Notes (Signed)
Received report and assumed pt care. Propofol placed on hold for neuro assessment. Patient alert and calm. Front, upward fixed gaze. No tracking. Will continue to monitor.

## 2021-01-04 NOTE — Progress Notes (Signed)
Patient RIJ CVC infusing and flushed without issue at the beginning of shift, all port had positive blood return. Went to draw labs and proximal port too sluggish for collection. Cathflo requested and instilled in proximal port. Will continue to monitor.

## 2021-01-04 NOTE — Progress Notes (Signed)
PHARMACY CONSULT NOTE  Pharmacy Consult for Electrolyte Monitoring and Replacement   Recent Labs: Potassium (mmol/L)  Date Value  01/04/2021 4.9   Magnesium (mg/dL)  Date Value  60/73/7106 2.5 (H)   Calcium (mg/dL)  Date Value  26/94/8546 8.7 (L)   Albumin (g/dL)  Date Value  27/10/5007 2.4 (L)   Phosphorus (mg/dL)  Date Value  38/18/2993 5.0 (H)   Sodium (mmol/L)  Date Value  01/04/2021 142   Corrected calcium:  9.4 mg/dL  Assessment:  60 year old male with obesity, diabetes mellitus, hypertension, and combined diastolic and systolic heart failure who presented to the emergency department yesterday in respiratory distress admitted following witnessed cardiac arrest. Pt is S/P CPR intubation. Pharmacy consulted for electrolyte monitoring and replacement.  PROSource 57mL TID Vital High Protein @ 71ml/hr Free water 94mL q4h  Goal of Therapy:  Electrolytes WNL  Plan:   No replacement indicated at this time  Will follow with AM labs and replace as needed.  Pricilla Riffle, PharmD Clinical Pharmacist 01/04/2021 1:43 PM

## 2021-01-04 NOTE — Plan of Care (Signed)
  Problem: Clinical Measurements: Goal: Ability to maintain clinical measurements within normal limits will improve Outcome: Not Progressing Goal: Will remain free from infection Outcome: Not Progressing Goal: Diagnostic test results will improve Outcome: Not Progressing Goal: Respiratory complications will improve Outcome: Not Progressing Goal: Cardiovascular complication will be avoided Outcome: Not Progressing   Problem: Nutrition: Goal: Adequate nutrition will be maintained Outcome: Not Progressing   Problem: Elimination: Goal: Will not experience complications related to bowel motility Outcome: Not Progressing Goal: Will not experience complications related to urinary retention Outcome: Not Progressing   Problem: Pain Managment: Goal: General experience of comfort will improve Outcome: Not Progressing   Problem: Safety: Goal: Ability to remain free from injury will improve Outcome: Not Progressing   Problem: Skin Integrity: Goal: Risk for impaired skin integrity will decrease Outcome: Not Progressing   Problem: Activity: Goal: Ability to tolerate increased activity will improve Outcome: Not Progressing   Problem: Respiratory: Goal: Ability to maintain a clear airway and adequate ventilation will improve Outcome: Not Progressing   Problem: Role Relationship: Goal: Method of communication will improve Outcome: Not Progressing   

## 2021-01-04 NOTE — Progress Notes (Signed)
Able to restore blood return to proximal lumen post Cathflo and 120 minute dwell. Extracted long clots. Unable to obtain blood return from other CVC lumens at this time. IV team consulted.

## 2021-01-04 NOTE — Progress Notes (Signed)
Peripherally Inserted Central Catheter Placement  The IV Nurse has discussed with the patient and/or persons authorized to consent for the patient, the purpose of this procedure and the potential benefits and risks involved with this procedure.  The benefits include less needle sticks, lab draws from the catheter, and the patient may be discharged home with the catheter. Risks include, but not limited to, infection, bleeding, blood clot (thrombus formation), and puncture of an artery; nerve damage and irregular heartbeat and possibility to perform a PICC exchange if needed/ordered by physician.  Alternatives to this procedure were also discussed.  Bard Power PICC patient education guide, fact sheet on infection prevention and patient information card has been provided to patient /or left at bedside.    PICC Placement Documentation  PICC Double Lumen 01/04/21 PICC Right Basilic 44 cm 0 cm (Active)  Indication for Insertion or Continuance of Line Prolonged intravenous therapies 01/04/21 1836  Exposed Catheter (cm) 0 cm 01/04/21 1836  Site Assessment Clean;Dry;Intact 01/04/21 1836  Lumen #1 Status Flushed;Saline locked;Blood return noted 01/04/21 1836  Lumen #2 Status Flushed;Saline locked;Blood return noted 01/04/21 1836  Dressing Type Transparent;Securing device 01/04/21 1836  Dressing Status Clean;Dry;Intact 01/04/21 1836  Antimicrobial disc in place? Yes 01/04/21 1836  Dressing Intervention New dressing 01/04/21 1836  Dressing Change Due 01/11/21 01/04/21 1836    Consent signed by sister, Demond Shallenberger, via telephone verfied by 2 RNs.   Annett Fabian 01/04/2021, 6:37 PM

## 2021-01-04 NOTE — Plan of Care (Signed)
Patient remains intubated and sedation. Attempted to do a wake up trial this morning. Propofol infusion decreased to half the original dose. Patient experiences tachypnea with RR 30s and vent dyssynchrony. Patient spontaneously opens eyes, does not track, and does not follow commands. Patient did not have any response to pain in BUE and minimal flickers of muscle/attempts to withdraw to pain in BLE. He will spontaneously move BLE non-purposely. Cough, gag, and corneal reflexes remain intact. He continues to have copious amounts of oral and ETT secretions, specimen sent for cultures this shift. Patient afebrile, NSR with HR 60s w/ BBB and stable BP throughout shift. Tube feeds continued and at goal rate. Patient had 2 Bms this shift and has had adequate UOP. PICC line ordered, for prolonged access and CVC removal. Plan for family meeting and goals of care discussion with team.   Problem: Clinical Measurements: Goal: Ability to maintain clinical measurements within normal limits will improve Outcome: Progressing Goal: Will remain free from infection Outcome: Progressing Goal: Diagnostic test results will improve Outcome: Progressing Goal: Cardiovascular complication will be avoided Outcome: Progressing   Problem: Nutrition: Goal: Adequate nutrition will be maintained Outcome: Progressing   Problem: Elimination: Goal: Will not experience complications related to bowel motility Outcome: Progressing   Problem: Pain Managment: Goal: General experience of comfort will improve Outcome: Progressing   Problem: Safety: Goal: Ability to remain free from injury will improve Outcome: Progressing

## 2021-01-04 NOTE — Progress Notes (Signed)
Patient was transported to CT and back to the ICU on the ventilator. There was no issues on the transport.

## 2021-01-04 NOTE — Progress Notes (Signed)
Daily Progress Note   Patient Name: Jeremy Browning       Date: 01/04/2021 DOB: [birth year]  Age: 60 y.o. MRN#: [id number] Attending Physician: Salena Saner, MD Primary Care Physician: No primary care provider on file. Admit Date: 12/25/2020  Reason for Consultation/Follow-up: Establishing goals of care  Subjective: Chart Reviewed. Updates Received. Patient Assessed.   Patient remains intubated and sedated. Unable to wean and is unresponsive on wake assessment. Per notations becomes tachypneic when attempts to wean down sedation. No family at the bedside.   I spoke with patient's brother Jeremy Browning and provided updates. He verbalized understanding and appreciation. Goals of care reviewed with focus on Jeremy Browning's quality of life and prognosis. Jeremy Browning shares his understanding. He states he and his siblings are continuing to have ongoing discussions. They are planning to have follow-up discussion tomorrow with medical provider to assist in their final decisions.   Jeremy Browning states they do not want to see their brother suffer, however also struggling about the "what ifs". Support provided. We discussed no meaningful recovery and attempts made to discuss what care would continue to look like. Brother verbalizes understanding and again confirms family are still in discussion. The goal is to continue with current plan of care until further decisions have been made.   All questions answered and support provided.   Length of Stay: 10 days  Vital Signs: BP (!) 122/43   Pulse 64   Temp 99.5 F (37.5 C) (Oral)   Resp 16   Ht 6' 0.76" (1.848 m)   Wt 122.5 kg   SpO2 100%   BMI 35.87 kg/m  SpO2: SpO2: 100 % O2 Device: O2 Device: Ventilator O2 Flow Rate:    Physical Exam: Intubated, full vent support, unresponsive Coarse breath sounds              Flowsheet Rows   Flowsheet Row Most Recent Value  Intake Tab   Referral Department Hospitalist  Unit at Time of Referral ICU  Date Notified 12/28/20   Palliative Care Type New Palliative care  Reason for referral Clarify Goals of Care  Date of Admission 12/25/20  Date first seen by Palliative Care 12/29/20  # of days Palliative referral response time 1 Day(s)  # of days IP prior to Palliative referral 3  Clinical Assessment   Palliative Performance Scale Score 20%  Pain Max last 24 hours Not able to report  Pain Min Last 24 hours Not able to report  Dyspnea Max Last 24 Hours Not able to report  Dyspnea Min Last 24 hours Not able to report  Psychosocial & Spiritual Assessment   Palliative Care Outcomes       Palliative Care Assessment & Plan  HPI: Per colleague Jeremy Booze, NP: 60 y.o. 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   Code Status:  Full code  Goals of Care/Recommendations:  Jeremy Browning (brother) is appreciative of continued support. Reports family is continuing to engage in ongoing discussions amongst themselves. They plan to follow up tomorrow with attending provider for further updates and possible decisions. Request to continue full scope care.   PMT will continue to support and follow as needed.   Prognosis: POOR   Discharge Planning: To Be Determined  Thank you for allowing the Palliative Medicine Team to assist in the care of this patient.  Time Total: 35  min.   Visit consisted of counseling and education dealing with the complex and emotionally intense issues of symptom management and palliative care in the setting of serious and potentially life-threatening illness.Greater than 50%  of this time was spent counseling and coordinating care related to the above assessment and plan.  Willette Alma, AGPCNP-BC  Palliative Medicine Team [phone]

## 2021-01-04 NOTE — Plan of Care (Signed)
  Problem: Clinical Measurements: Goal: Ability to maintain clinical measurements within normal limits will improve Outcome: Not Progressing Goal: Will remain free from infection Outcome: Not Progressing Goal: Diagnostic test results will improve Outcome: Not Progressing Goal: Respiratory complications will improve Outcome: Not Progressing Goal: Cardiovascular complication will be avoided Outcome: Not Progressing   Problem: Nutrition: Goal: Adequate nutrition will be maintained Outcome: Not Progressing   Problem: Elimination: Goal: Will not experience complications related to bowel motility Outcome: Not Progressing Goal: Will not experience complications related to urinary retention Outcome: Not Progressing   Problem: Pain Managment: Goal: General experience of comfort will improve Outcome: Not Progressing   Problem: Safety: Goal: Ability to remain free from injury will improve Outcome: Not Progressing   Problem: Skin Integrity: Goal: Risk for impaired skin integrity will decrease Outcome: Not Progressing   Problem: Activity: Goal: Ability to tolerate increased activity will improve Outcome: Not Progressing   Problem: Respiratory: Goal: Ability to maintain a clear airway and adequate ventilation will improve Outcome: Not Progressing   Problem: Role Relationship: Goal: Method of communication will improve Outcome: Not Progressing

## 2021-01-04 NOTE — TOC Progression Note (Signed)
Transition of Care [facility]) - Progression Note    Patient Details  Name: Jeremy Browning MRN: [id number] Date of Birth: 1960/12/15  Transition of Care [facility]) CM/SW Contact  Hetty Ely, RN Phone Number: 01/04/2021, 2:59 PM  Clinical Narrative: During progression rounds patient condition continues to be unstable, with low fever, cough, and intubation. Attending will meet with family tomorrow to discuss health goals. TOC will continue to track.      Expected Discharge Plan: Skilled Nursing Facility Barriers to Discharge: Continued Medical Work up  Expected Discharge Plan and Services Expected Discharge Plan: Skilled Nursing Facility In-house Referral: Clinical Social Work   Post Acute Care Choice: Skilled Nursing Facility                                         Social Determinants of Health (SDOH) Interventions    Readmission Risk Interventions No flowsheet data found.

## 2021-01-04 NOTE — Progress Notes (Signed)
Patient coughing excessively. No change in neuro status. Propofol resumed at minimal rate. Will continue to monitor.

## 2021-01-04 NOTE — Progress Notes (Signed)
Patient has a copious amount of oral and ETT secretions. Britton-Lee, NP notified and CXR ordered.

## 2021-01-04 NOTE — Progress Notes (Signed)
eeg done °

## 2021-01-04 NOTE — Progress Notes (Signed)
NAME:  Jeremy Browning, MRN:  [id number], DOB:  08/15/1961, LOS: 72 ADMISSION DATE:  12/25/2020 60 yo M presenting to [facility] ED from home via EMS with respiratory distress that had been progressively worsening over the course of 12/25/20. Per documentation family stated the patient was in his normal state of health until this morning when he began feeling unwell. He then called out stating he could not breathe. EMS found the patient hypoxic and in distress with SpO2 in the 70's on room air. Patient was place on CPAP, arriving to the ED unresponsive and pulseless. ED course: Upon arrival to ED patient was defibrillated x 1 and received CPR before achieving ROSC. Initial EKG suggestive of global ischemia, second EKG had possible LBBB vs IVCD. Patient was emergently intubated requiring mechanical ventilation. Per ED documentation STEMI team activated in the setting of a witnessed shockable cardiac arrest with clinical exam highly suggestive of pulmonary edema with pink frothy sputum throughout ET tubing.  Pertinent Medical History  Obesity Hypertension Diabetes mellitus Chronic renal insufficiency Combined systolic and diastolic heart failure  Significant Hospital Events: Including procedures, antibiotic start and stop dates in addition to other pertinent events   Cardiac catheterization on presentation with normal coronaries, good cardiac output, elevated pulmonary capillary wedge pressure.  5/7 severe resp failure 5/8 remains on Vent 5/9 remains on vent , multiorgan failureMRI shows brain injury 5/10 remains on vent, multiorgan failure, brother updated 5/11 severe resp failure 5/12 failed weaning trials due to severe brain damage 5/13 failed weaning trials severe hypoxia dn vent dyssynchrony, sister updated 5/14 vent support, severe brain damage, prognosis is poor 5/16 unresponsive on wake up assessment, becomes tachypneic when sedation is lightened 5/17 no purposeful movement on wake up  assessment, becomes tachypneic, asynchronous with vent    MICRO DATA 5/7STAPHYLOCOCCUS HOMINIS1/4 cultures MRSA NEG 5/8 sputum cx: "normal flora"  COVID And INF A/B NEG  Antibiotics Given (last 72 hours)    None     Scheduled Meds: . chlorhexidine gluconate (MEDLINE KIT)  15 mL Mouth Rinse BID  . Chlorhexidine Gluconate Cloth  6 each Topical Q0600  . docusate  100 mg Per Tube BID  . enoxaparin (LOVENOX) injection  0.5 mg/kg Subcutaneous Q24H  . famotidine  20 mg Per Tube Daily  . feeding supplement (PROSource TF)  90 mL Per Tube QID  . feeding supplement (VITAL HIGH PROTEIN)  1,000 mL Per Tube Q24H  . free water  30 mL Per Tube Q4H  . insulin aspart  0-15 Units Subcutaneous Q4H  . mouth rinse  15 mL Mouth Rinse 10 times per day  . multivitamin  15 mL Per Tube Daily  . polyethylene glycol  17 g Per Tube Daily  . sodium chloride flush  10-40 mL Intracatheter Q12H  . sodium chloride flush  3 mL Intravenous Q12H   Continuous Infusions: . sodium chloride 5 mL/hr at 01/04/21 0600  . sodium chloride    . norepinephrine (LEVOPHED) Adult infusion Stopped (12/31/20 1250)  . propofol (DIPRIVAN) infusion 45 mcg/kg/min (01/04/21 0834)   PRN Meds:.sodium chloride, acetaminophen (TYLENOL) oral liquid 160 mg/5 mL, albuterol, docusate, fentaNYL (SUBLIMAZE) injection, hydrALAZINE, midazolam, polyethylene glycol, sodium chloride flush, sodium chloride flush    Interim History / Subjective:  Copious secretions, no response on wake up assessment   All family members afflicted with [id number] Family members self quarantining until tomorrow 3/18  Remains critically ill Prognosis is very poor, no response on wake-up assessment +multiorgan failure, cardiac,lungs, brain, kidneys  Objective   Blood pressure (!) 131/51, pulse 66, temperature 99.6 F (37.6 C), temperature source Axillary, resp. rate 19, height 6' 0.76" (1.848 m), weight 122.5 kg, SpO2 98 %.    Vent Mode:  PRVC FiO2 (%):  [28 %] 28 % Set Rate:  [16 bmp] 16 bmp Vt Set:  [550 mL] 550 mL PEEP:  [5 cmH20] 5 cmH20   Intake/Output Summary (Last 24 hours) at 01/04/2021 1000 Last data filed at 01/04/2021 0600 Gross per 24 hour  Intake 1514.2 ml  Output 2025 ml  Net -510.8 ml   Filed Weights   01/02/21 0419 01/03/21 0338 01/04/21 0449  Weight: 126.6 kg 127.9 kg 122.5 kg      REVIEW OF SYSTEMS  PATIENT IS UNABLE TO PROVIDE COMPLETE REVIEW OF SYSTEMS DUE TO SEVERE CRITICAL ILLNESS AND ACUTE ENCEPHALOPATHY, QUERY ANOXIC ENCEPHALOPATHY.   PHYSICAL EXAMINATION:  GENERAL: Intubated, mechanically ventilated, nonresponsive HEAD: Normocephalic, atraumatic.  EYES: Pupils equal, round, reactive to light.  No scleral icterus.  MOUTH: Orotracheally intubated, OG in place, oral mucosa moist. NECK: Supple.  Trachea midline. PULMONARY: Coarse breath sounds, no wheezes.  No rales.  No rhonchi. CARDIOVASCULAR: S1 and S2. Regular rate and rhythm. No murmurs, rubs, or gallops.  GASTROINTESTINAL: Soft, non-distended. Positive bowel sounds.  MUSCULOSKELETAL: No swelling, clubbing, or edema.  NEUROLOGIC: obtunded/unresponsive.  On wake up assessment opens eyes, does not track, does not blink to threat.  No purposeful movement, becomes tachypneic. SKIN:intact,warm,dry.   Labs/imaging that I havepersonally reviewed  (right click and "Reselect all SmartList Selections" daily)     ASSESSMENT AND PLAN  SYNOPSIS 60 yo morbidly obese AAM with acute and severe cardiac arrest NSTEMI with acute severe systolic cardiac failure with signs and symptoms of severe anoxic encephalopathy.  PATIENT IS NOT IMPROVING, PATIENT WITH MULTIORGAN FAILURE AND VERY POOR CHANCE OF MEANINGFUL RECOVERY  Severe ACUTE Hypoxic and Hypercapnic Respiratory Failure -continue Mechanical Ventilator support -continue Bronchodilator Therapy -Wean Fio2 and PEEP as tolerated -VAP/VENT bundle implementation   CARDIAC FAILURE-acute  systolic dysfunction -Lasix as tolerated -follow up cardiac enzymes as indicated   CARDIAC ICU monitoring   ACUTE KIDNEY INJURY/Renal Failure -continue Foley Catheter-assess need continued use necessary -Avoid nephrotoxic agents -Follow urine output, BMP -Ensure adequate renal perfusion, optimize oxygenation -Renal dose medications   NEUROLOGY Acute toxic metabolic encephalopathy Severe anoxic encephalopathy Goal RASS 0/-1 Prognosis is very poor Follow up Neuro recs Repeat CT head/ EEG    ENDO - ICU hypoglycemic\Hyperglycemia protocol -check FSBS per protocol   GI GI PROPHYLAXIS as indicated  NUTRITIONAL STATUS DIET-->TF's as tolerated Constipation protocol as indicated   ELECTROLYTES -follow labs as needed -replace as needed -pharmacy consultation and following      Best practice (right click and "Reselect all SmartList Selections" daily)  Diet:NPO Pain/Anxiety/Delirium protocol (if indicated):Yes(RASS goal -1,-2) VAP protocol (if indicated):Yes DVT prophylaxis:Subcutaneous Heparin GI prophylaxis:H2B Glucose control:SSIYes Central venous access:Yes, and it is still needed Arterial line:Yes, and it is still needed Foley:Yes, and it is still needed Mobility:bed rest PT consulted:No Code Status:full code, [place] discussion with family 3/18 Disposition:ICU   Labs   CBC: Recent Labs  Lab 12/29/20 0420 12/30/20 0533 12/31/20 0331 01/01/21 0300 01/02/21 0406 01/03/21 0354 01/04/21 0507  WBC 10.2   < > 9.2 9.8 9.3 9.9 9.6  NEUTROABS 5.7  --   --   --   --   --   --   HGB 10.3*   < > 9.3* 9.8* 9.4* 10.0* 9.9*  HCT 32.5*   < >  29.7* 31.9* 30.3* 31.7* 32.4*  MCV 78.9*   < > 80.9 81.0 80.6 78.9* 81.8  PLT 203   < > 227 282 321 393 439*   < > = values in this interval not displayed.    Basic Metabolic Panel: Recent Labs  Lab 12/31/20 0331 01/01/21 0300 01/02/21 0406 01/03/21 0354 01/04/21 0507  NA 142 139 141 141  142  K 4.3 4.5 4.6 4.6 4.9  CL 116* 114* 114* 116* 114*  CO2 20* 20* 20* 19* 20*  GLUCOSE 131* 137* 118* 120* 122*  BUN 43* 41* 38* 38* 48*  CREATININE 1.56* 1.35* 1.44* 1.37* 1.50*  CALCIUM 8.2* 8.3* 8.4* 8.6* 8.7*  MG 2.6* 2.5* 2.5* 2.3 2.5*  PHOS 4.6 4.1 4.4 4.4 5.0*   GFR: Estimated Creatinine Clearance: 71.6 mL/min (A) (by C-G formula based on SCr of 1.5 mg/dL (H)). Recent Labs  Lab 01/01/21 0300 01/02/21 0406 01/03/21 0354 01/04/21 0507  PROCALCITON  --   --   --  0.38  WBC 9.8 9.3 9.9 9.6    Liver Function Tests: Recent Labs  Lab 01/04/21 0507  ALBUMIN 2.4*   No results for input(s): LIPASE, AMYLASE in the last 168 hours. No results for input(s): AMMONIA in the last 168 hours.  ABG    Component Value Date/Time   PHART 7.37 12/29/2020 1032   PCO2ART 35 12/29/2020 1032   PO2ART 81 (L) 12/29/2020 1032   HCO3 20.2 12/29/2020 1032   ACIDBASEDEF 4.5 (H) 12/29/2020 1032   O2SAT 95.5 12/29/2020 1032     Coagulation Profile: No results for input(s): INR, PROTIME in the last 168 hours.  Cardiac Enzymes: No results for input(s): CKTOTAL, CKMB, CKMBINDEX, TROPONINI in the last 168 hours.  HbA1C: Hgb A1c MFr Bld  Date/Time Value Ref Range Status  12/25/2020 04:46 PM 5.9 (H) 4.8 - 5.6 % Final    Comment:    (NOTE) Pre diabetes:          5.7%-6.4%  Diabetes:              >6.4%  Glycemic control for   <7.0% adults with diabetes     CBG: Recent Labs  Lab 01/03/21 1542 01/03/21 1922 01/03/21 2322 01/04/21 0329 01/04/21 0727  GLUCAP 104* 106* 96 106* 106*    Allergies Not on File     DVT/GI PRX  assessed I Assessed the need for Labs I Assessed the need for Foley I Assessed the need for Central Venous Line Family Discussion when available I Assessed the need for Mobilization I made an Assessment of medications to be adjusted accordingly Safety Risk assessment completed  CASE DISCUSSED IN MULTIDISCIPLINARY ROUNDS WITH ICU  TEAM     Critical Care Time devoted to patient care services described in this note is 35 minutes.  Critical care was necessary to treat or prevent imminent or life-threatening deterioration. Overall, patient is critically ill, prognosis is guarded.  Patient with multiorgan failure and at high risk for cardiac arrest and death.   PROGNOSIS IS VERY POOR RECOMMEND DNR STATUS/PALLIATIVE CARE  PATIENT HAS VERY POOR CHANCE OF MEANINGFUL RECOVERY   C. Derrill Kay, MD [facility]   *This note was dictated using voice recognition software/Dragon.  Despite best efforts to proofread, errors can occur which can change the meaning.  Any change was purely unintentional.

## 2021-01-05 DIAGNOSIS — R4182 Altered mental status, unspecified: Secondary | ICD-10-CM | POA: Diagnosis not present

## 2021-01-05 DIAGNOSIS — I469 Cardiac arrest, cause unspecified: Secondary | ICD-10-CM | POA: Diagnosis not present

## 2021-01-05 DIAGNOSIS — N170 Acute kidney failure with tubular necrosis: Secondary | ICD-10-CM | POA: Diagnosis not present

## 2021-01-05 DIAGNOSIS — G939 Disorder of brain, unspecified: Secondary | ICD-10-CM | POA: Diagnosis not present

## 2021-01-05 LAB — GLUCOSE, CAPILLARY
Glucose-Capillary: 102 mg/dL — ABNORMAL HIGH (ref 70–99)
Glucose-Capillary: 115 mg/dL — ABNORMAL HIGH (ref 70–99)
Glucose-Capillary: 148 mg/dL — ABNORMAL HIGH (ref 70–99)
Glucose-Capillary: 159 mg/dL — ABNORMAL HIGH (ref 70–99)
Glucose-Capillary: 161 mg/dL — ABNORMAL HIGH (ref 70–99)
Glucose-Capillary: 181 mg/dL — ABNORMAL HIGH (ref 70–99)
Glucose-Capillary: 185 mg/dL — ABNORMAL HIGH (ref 70–99)

## 2021-01-05 LAB — CBC WITH DIFFERENTIAL/PLATELET
Abs Immature Granulocytes: 0.03 10*3/uL (ref 0.00–0.07)
Basophils Absolute: 0 10*3/uL (ref 0.0–0.1)
Basophils Relative: 0 %
Eosinophils Absolute: 0.5 10*3/uL (ref 0.0–0.5)
Eosinophils Relative: 4 %
HCT: 33.2 % — ABNORMAL LOW (ref 39.0–52.0)
Hemoglobin: 10.4 g/dL — ABNORMAL LOW (ref 13.0–17.0)
Immature Granulocytes: 0 %
Lymphocytes Relative: 25 %
Lymphs Abs: 2.6 10*3/uL (ref 0.7–4.0)
MCH: 24.9 pg — ABNORMAL LOW (ref 26.0–34.0)
MCHC: 31.3 g/dL (ref 30.0–36.0)
MCV: 79.6 fL — ABNORMAL LOW (ref 80.0–100.0)
Monocytes Absolute: 1 10*3/uL (ref 0.1–1.0)
Monocytes Relative: 10 %
Neutro Abs: 6.2 10*3/uL (ref 1.7–7.7)
Neutrophils Relative %: 61 %
Platelets: 466 10*3/uL — ABNORMAL HIGH (ref 150–400)
RBC: 4.17 MIL/uL — ABNORMAL LOW (ref 4.22–5.81)
RDW: 17 % — ABNORMAL HIGH (ref 11.5–15.5)
WBC: 10.3 10*3/uL (ref 4.0–10.5)
nRBC: 0 % (ref 0.0–0.2)

## 2021-01-05 LAB — BASIC METABOLIC PANEL
Anion gap: 7 (ref 5–15)
BUN: 48 mg/dL — ABNORMAL HIGH (ref 6–20)
CO2: 19 mmol/L — ABNORMAL LOW (ref 22–32)
Calcium: 8.8 mg/dL — ABNORMAL LOW (ref 8.9–10.3)
Chloride: 116 mmol/L — ABNORMAL HIGH (ref 98–111)
Creatinine, Ser: 1.3 mg/dL — ABNORMAL HIGH (ref 0.61–1.24)
GFR, Estimated: >60 mL/min (ref >60)
Glucose, Bld: 130 mg/dL — ABNORMAL HIGH (ref 70–99)
Potassium: 4.6 mmol/L (ref 3.5–5.1)
Sodium: 142 mmol/L (ref 135–145)

## 2021-01-05 LAB — PHOSPHORUS: Phosphorus: 4.4 mg/dL (ref 2.5–4.6)

## 2021-01-05 LAB — PROCALCITONIN: Procalcitonin: 0.24 ng/mL

## 2021-01-05 LAB — MAGNESIUM: Magnesium: 2.4 mg/dL (ref 1.7–2.4)

## 2021-01-05 MED ORDER — NOREPINEPHRINE 16 MG/250ML-% IV SOLN
0.0000 ug/min | INTRAVENOUS | Status: DC
  Administered 2021-01-05: 16 ug/min via INTRAVENOUS
  Administered 2021-01-05: 17 ug/min via INTRAVENOUS
  Administered 2021-01-06 – 2021-01-07 (×2): 20 ug/min via INTRAVENOUS
  Filled 2021-01-05 (×6): qty 250

## 2021-01-05 MED ORDER — NOREPINEPHRINE 4 MG/250ML-% IV SOLN
0.0000 ug/min | INTRAVENOUS | Status: DC
  Administered 2021-01-05: 2 ug/min via INTRAVENOUS
  Filled 2021-01-05: qty 250

## 2021-01-05 NOTE — Progress Notes (Addendum)
GOALS OF CARE DISCUSSION  The Clinical status was relayed to family in detail Sister Marylene Land @ [phone]  Updated and notified of patients medical condition.    Patient remains unresponsive and will not open eyes to command.   Patient is having a weak cough and struggling to remove secretions.   Patient with increased WOB and using accessory muscles to breathe Explained to family course of therapy and the modalities  Patient with severe brain damage  Patient with Progressive multiorgan failure with a very high probablity of a very minimal chance of meaningful recovery despite all aggressive and optimal medical therapy.  PATIENT REMAINS FULL CODE  Family does not quite understands the situation of severe brain damage and that they have a strong faith in God and that patient will fully recovery.   They are discussing DNR status but have NOT made final decisions. They are also contemplating TRACH and FEEDINGS tube  The family is meeting to tonight to make decisions soon  Family are satisfied with Plan of action and management. All questions answered  Additional CC time 35 mins   Shenicka Sunderlin Santiago Glad, M.D.  [facility]  Medical Director [facility] Medical Director [facility] Cardio-Pulmonary Department

## 2021-01-05 NOTE — Procedures (Signed)
Patient Name: Jeremy Browning  MRN: [id number]  Epilepsy Attending: Charlsie Quest  Referring Physician/Provider: Dr Sarina Ser Date: 01/04/2021 Duration: 29.27 mins  Patient history: 60 year old male with altered mental status.  EEG to evaluate for seizures.  Level of alertness:  comatose  AEDs during EEG study: Propofol  Technical aspects: This EEG study was done with scalp electrodes positioned according to the 10-20 International system of electrode placement. Electrical activity was acquired at a sampling rate of 500Hz  and reviewed with a high frequency filter of 70Hz  and a low frequency filter of 1Hz . EEG data were recorded continuously and digitally stored.   Description: EEG showed continuous generalized 6 to 8 Hz theta- alpha activity admixed with intermittent generalized 2 to 3 Hz delta slowing. Hyperventilation and photic stimulation were not performed.     ABNORMALITY - Continuous slow, generalized  IMPRESSION: This study is suggestive of moderate diffuse encephalopathy, nonspecific etiology. No seizures or epileptiform discharges were seen throughout the recording.  Aemilia Dedrick 

## 2021-01-05 NOTE — Progress Notes (Signed)
NAME:  Jeremy Browning, MRN:  [id number], DOB:  Feb 27, 1961, LOS: 11 ADMISSION DATE:  12/25/2020 60 yo M presenting to [facility] ED from home via EMS with respiratory distress that had been progressively worsening over the course of 12/25/20. Per documentation family stated the patient was in his normal state of health until this morning when he began feeling unwell. He then called out stating he could not breathe. EMS found the patient hypoxic and in distress with SpO2 in the 70's on room air. Patient was place on CPAP, arriving to the ED unresponsive and pulseless. ED course: Upon arrival to ED patient was defibrillated x 1 and received CPR before achieving ROSC. Initial EKG suggestive of global ischemia, second EKG had possible LBBB vs IVCD. Patient was emergently intubated requiring mechanical ventilation. Per ED documentation STEMI team activated in the setting of a witnessed shockable cardiac arrest with clinical exam highly suggestive of pulmonary edema with pink frothy sputum throughout ET tubing.  Pertinent Medical History  Obesity Hypertension Diabetes mellitus Chronic renal insufficiency Combined systolic and diastolic heart failure  Significant Hospital Events: Including procedures, antibiotic start and stop dates in addition to other pertinent events   Cardiac catheterization on presentation with normal coronaries, good cardiac output, elevated pulmonary capillary wedge pressure.  5/7 severe resp failure 5/8remains on Vent 5/9remains on vent , multiorgan failureMRI shows brain injury 5/10remains on vent, multiorgan failure, brother updated 5/11severe resp failure 5/26failed weaning trials due to severe brain damage 5/48failed weaning trials severe hypoxia dn vent dyssynchrony, sister updated 5/14 vent support, severe brain damage, prognosis is poor 5/16 unresponsive on wake up assessment, becomes tachypneic when sedation is lightened 5/17 no purposeful movement on wake up  assessment, becomes tachypneic, asynchronous with vent    MICRO DATA 5/7STAPHYLOCOCCUS HOMINIS1/4 cultures MRSA NEG 5/8 sputum cx: "normal flora"  COVID And INF A/B NEG     Antibiotics Given (last 72 hours)    None        Interim History / Subjective:  Remains intubated Severe brain damage +mulitorgan failure failure-brain,kidenys, heart ,lungs Remains critically ill     Objective   Blood pressure (!) 121/39, pulse (!) 55, temperature 98.1 F (36.7 C), temperature source Axillary, resp. rate 20, height 6' 0.76" (1.848 m), weight 122.5 kg, SpO2 100 %.    Vent Mode: PRVC FiO2 (%):  [28 %] 28 % Set Rate:  [16 bmp] 16 bmp Vt Set:  [550 mL] 550 mL PEEP:  [5 cmH20] 5 cmH20   Intake/Output Summary (Last 24 hours) at 01/05/2021 0724 Last data filed at 01/05/2021 0600 Gross per 24 hour  Intake 1697.32 ml  Output 2525 ml  Net -827.68 ml   Filed Weights   01/03/21 0338 01/04/21 0449 01/05/21 0355  Weight: 127.9 kg 122.5 kg 122.5 kg      REVIEW OF SYSTEMS  PATIENT IS UNABLE TO PROVIDE COMPLETE REVIEW OF SYSTEMS DUE TO SEVERE CRITICAL ILLNESS AND TOXIC METABOLIC ENCEPHALOPATHY   PHYSICAL EXAMINATION:  GENERAL:critically ill appearing, +resp distress HEAD: Normocephalic, atraumatic.  EYES: Pupils equal, round, reactive to light.  No scleral icterus.  MOUTH: Moist mucosal membrane. NECK: Supple. PULMONARY: +rhonchi, +wheezing CARDIOVASCULAR: S1 and S2. Regular rate and rhythm. No murmurs, rubs, or gallops.  GASTROINTESTINAL: Soft, nontender, -distended. Positive bowel sounds.  MUSCULOSKELETAL: No swelling, clubbing, or edema.  NEUROLOGIC: obtunded SKIN:intact,warm,dry   Labs/imaging that I havepersonally reviewed  (right click and "Reselect all SmartList Selections" daily)       ASSESSMENT AND PLAN SYNOPSIS  59 yo morbidly obese AAM with acute and severe cardiac arrest NSTEMI with acute severe systolic cardiac failure with signs and  symptoms of severe anoxic encephalopathy.  PATIENT IS NOT IMPROVING, PATIENT WITH MULTIORGAN FAILURE AND VERY POOR CHANCE OF MEANINGFUL RECOVERY    Severe ACUTE Hypoxic and Hypercapnic Respiratory Failure -continue Mechanical Ventilator support -continue Bronchodilator Therapy -Wean Fio2 and PEEP as tolerated -VAP/VENT bundle implementation Unable to wean from vent Needs trach and PEG tube for survival    CARDIAC FAILURE-acute  systolic/ dysfunction -Lasix as tolerated -follow up cardiac enzymes as indicated   CARDIAC ICU monitoring   ACUTE KIDNEY INJURY/Renal Failure -continue Foley Catheter-assess need -Avoid nephrotoxic agents -Follow urine output, BMP -Ensure adequate renal perfusion, optimize oxygenation -Renal dose medications   NEUROLOGY Acute toxic metabolic encephalopathy, need for sedation Goal RASS -2 to -3 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    ENDO - ICU hypoglycemic\Hyperglycemia protocol -check FSBS per protocol   GI GI PROPHYLAXIS as indicated  NUTRITIONAL STATUS DIET-->TF's as tolerated Constipation protocol as indicated   ELECTROLYTES -follow labs as needed -replace as needed -pharmacy consultation and following    Best practice (right click and "Reselect all SmartList Selections" daily)  Diet:NPO Pain/Anxiety/Delirium protocol (if indicated):Yes(RASS goal -1,-2) VAP protocol (if indicated):Yes DVT prophylaxis:Subcutaneous Heparin GI prophylaxis:H2B Glucose control:SSIYes Central venous access:Yes, and it is still needed Arterial line:Yes, and it is still needed Foley:Yes, and it is still needed Mobility:bed rest PT consulted:No Code Status:full code, GOC discussion with family 3/18 Disposition:ICU   Labs   CBC: Recent Labs  Lab 01/01/21 0300 01/02/21 0406 01/03/21 0354 01/04/21 0507 01/05/21 0348  WBC 9.8 9.3 9.9 9.6 10.3  NEUTROABS  --   --   --   --  6.2  HGB 9.8* 9.4* 10.0* 9.9* 10.4*  HCT  31.9* 30.3* 31.7* 32.4* 33.2*  MCV 81.0 80.6 78.9* 81.8 79.6*  PLT 282 321 393 439* 466*    Basic Metabolic Panel: Recent Labs  Lab 01/01/21 0300 01/02/21 0406 01/03/21 0354 01/04/21 0507 01/05/21 0348  NA 139 141 141 142 142  K 4.5 4.6 4.6 4.9 4.6  CL 114* 114* 116* 114* 116*  CO2 20* 20* 19* 20* 19*  GLUCOSE 137* 118* 120* 122* 130*  BUN 41* 38* 38* 48* 48*  CREATININE 1.35* 1.44* 1.37* 1.50* 1.30*  CALCIUM 8.3* 8.4* 8.6* 8.7* 8.8*  MG 2.5* 2.5* 2.3 2.5* 2.4  PHOS 4.1 4.4 4.4 5.0* 4.4   GFR: Estimated Creatinine Clearance: 82.6 mL/min (A) (by C-G formula based on SCr of 1.3 mg/dL (H)). Recent Labs  Lab 01/02/21 0406 01/03/21 0354 01/04/21 0507 01/05/21 0348  PROCALCITON  --   --  0.38 0.24  WBC 9.3 9.9 9.6 10.3    Liver Function Tests: Recent Labs  Lab 01/04/21 0507  ALBUMIN 2.4*   No results for input(s): LIPASE, AMYLASE in the last 168 hours. No results for input(s): AMMONIA in the last 168 hours.  ABG    Component Value Date/Time   PHART 7.37 12/29/2020 1032   PCO2ART 35 12/29/2020 1032   PO2ART 81 (L) 12/29/2020 1032   HCO3 20.2 12/29/2020 1032   ACIDBASEDEF 4.5 (H) 12/29/2020 1032   O2SAT 95.5 12/29/2020 1032     Coagulation Profile: No results for input(s): INR, PROTIME in the last 168 hours.  Cardiac Enzymes: No results for input(s): CKTOTAL, CKMB, CKMBINDEX, TROPONINI in the last 168 hours.  HbA1C: Hgb A1c MFr Bld  Date/Time Value Ref Range Status  12/25/2020 04:46 PM 5.9 (  H) 4.8 - 5.6 % Final    Comment:    (NOTE) Pre diabetes:          5.7%-6.4%  Diabetes:              >6.4%  Glycemic control for   <7.0% adults with diabetes     CBG: Recent Labs  Lab 01/04/21 1526 01/04/21 1951 01/04/21 2316 01/05/21 0338 01/05/21 0718  GLUCAP 92 89 97 102* 115*    Allergies Not on File     DVT/GI PRX  assessed I Assessed the need for Labs I Assessed the need for Foley I Assessed the need for Central Venous Line Family  Discussion when available I Assessed the need for Mobilization I made an Assessment of medications to be adjusted accordingly Safety Risk assessment completed  CASE DISCUSSED IN MULTIDISCIPLINARY ROUNDS WITH ICU TEAM  RECOMMEND DNR STATUS AND RECOMMEND COMFORT CARE MEASURES PATIENT WITH NO CHANCE OF MEANINGFUL RECOVERY   Critical Care Time devoted to patient care services described in this note is 65 minutes.  Critical care was necessary to treat or prevent imminent or life-threatening deterioration. Overall, patient is critically ill, prognosis is guarded.  Patient with Multiorgan failure and at high risk for cardiac arrest and death.    Lucie Leather, M.D.  [facility]  Medical Director [facility] Medical Director [facility] Cardio-Pulmonary Department

## 2021-01-05 NOTE — Progress Notes (Signed)
Patient not tolerating vent lying flat for bath, PRN Versed given per order. Post administration BP soft 98/39, MAP 57. Patient also put out urine in 2 hours. Propofol placed on hold and patient remains alert and calm at this time. Annabelle Harman, NP made aware. Will continue to monitor.

## 2021-01-05 NOTE — Progress Notes (Signed)
GOALS OF CARE DISCUSSION  The Clinical status was relayed to family in detail. Brother Ballantine, Sister Marylene Land Updated and notified of patients medical condition.    Patient remains unresponsive and will not open eyes to command.   Patient is having a weak cough and struggling to remove secretions.   Patient with increased WOB and using accessory muscles to breathe Explained to family course of therapy and the modalities  Severe brain damage on exam  Patient with Progressive multiorgan failure with a very high probablity of a very minimal chance of meaningful recovery despite all aggressive and optimal medical therapy.    Family understands the situation.  They have consented and agreed to DNR/DNI  Status Family will need to decide one way extubation or TRACH/PEG  Family are satisfied with Plan of action and management. All questions answered  Additional CC time 35 mins   Kyce Ging Santiago Glad, M.D.  [facility]  Medical Director [facility] Medical Director [facility] Cardio-Pulmonary Department

## 2021-01-05 NOTE — Progress Notes (Signed)
EEG performed today reveals continuous generalized slowing admixed with intermittent generalized 2 to 3 Hz delta slowing. The study is suggestive of moderate diffuse encephalopathy, nonspecific to etiology. No seizures or epileptiform discharges were seen throughout the recording.  A/R: 60 year old male status-post cardiac arrest - Given EEG which is not flatline or very low voltage, the patient's long-term neurological prognosis is likely to be better than originally anticipated.  - The cortical restricted diffusion seen on prior MRI may therefore also be reversible. This can be reassessed with a follow up MRI brain. If the patient is stable for MRI, would obtain today or tomorrow.  - Would attempt to wean off all sedation after repeat MRI is obtained.   Electronically signed: Dr. Caryl Pina

## 2021-01-05 NOTE — Progress Notes (Signed)
PHARMACY CONSULT NOTE  Pharmacy Consult for Electrolyte Monitoring and Replacement   Recent Labs: Potassium (mmol/L)  Date Value  01/05/2021 4.6   Magnesium (mg/dL)  Date Value  15/72/6203 2.4   Calcium (mg/dL)  Date Value  55/97/4163 8.8 (L)   Albumin (g/dL)  Date Value  84/53/6468 2.4 (L)   Phosphorus (mg/dL)  Date Value  11/09/2246 4.4   Sodium (mmol/L)  Date Value  01/05/2021 142   Corrected calcium:  9.4 mg/dL  Assessment:  60 year old male with obesity, diabetes mellitus, hypertension, and combined diastolic and systolic heart failure who presented to the emergency department yesterday in respiratory distress admitted following witnessed cardiac arrest. Pt is S/P CPR. Remains intubated. Pharmacy consulted for electrolyte monitoring and replacement.  PROSource 46mL TID Vital High Protein @ 58ml/hr Free water 106mL q4h  Goal of Therapy:  Electrolytes WNL  Plan:   No replacement indicated at this time  Will follow with AM labs and replace as needed.  Pricilla Riffle, PharmD Clinical Pharmacist 01/05/2021 12:28 PM

## 2021-01-05 NOTE — Progress Notes (Signed)
Patient BP remains low, 116/41 MAP 62. Pt remains alert and calm. Fixed gaze and posturing noted with cough stimulation in left arm. Annabelle Harman, NP made aware. Levophed ordered. Will continue to monitor.

## 2021-01-06 DIAGNOSIS — I469 Cardiac arrest, cause unspecified: Secondary | ICD-10-CM | POA: Diagnosis not present

## 2021-01-06 DIAGNOSIS — N170 Acute kidney failure with tubular necrosis: Secondary | ICD-10-CM | POA: Diagnosis not present

## 2021-01-06 LAB — CBC WITH DIFFERENTIAL/PLATELET
Abs Immature Granulocytes: 0.07 10*3/uL (ref 0.00–0.07)
Basophils Absolute: 0.1 10*3/uL (ref 0.0–0.1)
Basophils Relative: 0 %
Eosinophils Absolute: 0.5 10*3/uL (ref 0.0–0.5)
Eosinophils Relative: 4 %
HCT: 34.6 % — ABNORMAL LOW (ref 39.0–52.0)
Hemoglobin: 11.2 g/dL — ABNORMAL LOW (ref 13.0–17.0)
Immature Granulocytes: 1 %
Lymphocytes Relative: 24 %
Lymphs Abs: 2.8 10*3/uL (ref 0.7–4.0)
MCH: 26 pg (ref 26.0–34.0)
MCHC: 32.4 g/dL (ref 30.0–36.0)
MCV: 80.5 fL (ref 80.0–100.0)
Monocytes Absolute: 1.2 10*3/uL — ABNORMAL HIGH (ref 0.1–1.0)
Monocytes Relative: 10 %
Neutro Abs: 7.2 10*3/uL (ref 1.7–7.7)
Neutrophils Relative %: 61 %
Platelets: 569 10*3/uL — ABNORMAL HIGH (ref 150–400)
RBC: 4.3 MIL/uL (ref 4.22–5.81)
RDW: 16.7 % — ABNORMAL HIGH (ref 11.5–15.5)
WBC: 11.7 10*3/uL — ABNORMAL HIGH (ref 4.0–10.5)
nRBC: 0 % (ref 0.0–0.2)

## 2021-01-06 LAB — MAGNESIUM: Magnesium: 2.3 mg/dL (ref 1.7–2.4)

## 2021-01-06 LAB — CULTURE, RESPIRATORY W GRAM STAIN: Special Requests: NORMAL

## 2021-01-06 LAB — BASIC METABOLIC PANEL
Anion gap: 8 (ref 5–15)
BUN: 40 mg/dL — ABNORMAL HIGH (ref 6–20)
CO2: 20 mmol/L — ABNORMAL LOW (ref 22–32)
Calcium: 9.1 mg/dL (ref 8.9–10.3)
Chloride: 115 mmol/L — ABNORMAL HIGH (ref 98–111)
Creatinine, Ser: 1.35 mg/dL — ABNORMAL HIGH (ref 0.61–1.24)
GFR, Estimated: >60 mL/min (ref >60)
Glucose, Bld: 201 mg/dL — ABNORMAL HIGH (ref 70–99)
Potassium: 5 mmol/L (ref 3.5–5.1)
Sodium: 143 mmol/L (ref 135–145)

## 2021-01-06 LAB — GLUCOSE, CAPILLARY
Glucose-Capillary: 142 mg/dL — ABNORMAL HIGH (ref 70–99)
Glucose-Capillary: 147 mg/dL — ABNORMAL HIGH (ref 70–99)
Glucose-Capillary: 152 mg/dL — ABNORMAL HIGH (ref 70–99)
Glucose-Capillary: 168 mg/dL — ABNORMAL HIGH (ref 70–99)
Glucose-Capillary: 170 mg/dL — ABNORMAL HIGH (ref 70–99)
Glucose-Capillary: 181 mg/dL — ABNORMAL HIGH (ref 70–99)

## 2021-01-06 LAB — PHOSPHORUS: Phosphorus: 4 mg/dL (ref 2.5–4.6)

## 2021-01-06 LAB — PROCALCITONIN: Procalcitonin: 0.19 ng/mL

## 2021-01-06 LAB — TRIGLYCERIDES: Triglycerides: 253 mg/dL — ABNORMAL HIGH (ref <150)

## 2021-01-06 MED ORDER — SODIUM CHLORIDE 0.9 % IV SOLN
2.0000 g | INTRAVENOUS | Status: AC
  Administered 2021-01-06 – 2021-01-13 (×8): 2 g via INTRAVENOUS
  Filled 2021-01-06 (×8): qty 2

## 2021-01-06 MED ORDER — FENTANYL 2500MCG IN NS 250ML (10MCG/ML) PREMIX INFUSION
0.0000 ug/h | INTRAVENOUS | Status: DC
Start: 2021-01-06 — End: 2021-01-14
  Administered 2021-01-06: 25 ug/h via INTRAVENOUS
  Administered 2021-01-08 – 2021-01-10 (×2): 50 ug/h via INTRAVENOUS
  Administered 2021-01-11 – 2021-01-12 (×2): 100 ug/h via INTRAVENOUS
  Administered 2021-01-13: 150 ug/h via INTRAVENOUS
  Filled 2021-01-06 (×6): qty 250

## 2021-01-06 MED ORDER — VITAL 1.5 CAL PO LIQD
1000.0000 mL | ORAL | Status: DC
  Administered 2021-01-06 – 2021-01-10 (×5): 1000 mL

## 2021-01-06 NOTE — Progress Notes (Signed)
NAME:  Jeremy Browning, MRN:  [id number], DOB:  28-May-1961, LOS: 12 ADMISSION DATE:  12/25/2020 60 yo M presenting to [facility] ED from home via EMS with respiratory distress that had been progressively worsening over the course of 12/25/20. Per documentation family stated the patient was in his normal state of health until this morning when he began feeling unwell. He then called out stating he could not breathe. EMS found the patient hypoxic and in distress with SpO2 in the 70's on room air. Patient was place on CPAP, arriving to the ED unresponsive and pulseless. ED course: Upon arrival to ED patient was defibrillated x 1 and received CPR before achieving ROSC. Initial EKG suggestive of global ischemia, second EKG had possible LBBB vs IVCD. Patient was emergently intubated requiring mechanical ventilation. Per ED documentation STEMI team activated in the setting of a witnessed shockable cardiac arrest with clinical exam highly suggestive of pulmonary edema with pink frothy sputum throughout ET tubing.  Pertinent Medical History  Obesity Hypertension Diabetes mellitus Chronic renal insufficiency Combined systolic and diastolic heart failure  Significant Hospital Events: Including procedures, antibiotic start and stop dates in addition to other pertinent events   Cardiac catheterization on presentation with normal coronaries, good cardiac output, elevated pulmonary capillary wedge pressure.  5/7 severe resp failure 5/8remains on Vent 5/9remains on vent , multiorgan failureMRI shows brain injury 5/10remains on vent, multiorgan failure, brother updated 5/11severe resp failure 5/13failed weaning trials due to severe brain damage 5/39failed weaning trials severe hypoxia dn vent dyssynchrony, sister updated 5/14vent support, severe brain damage, prognosis is poor 5/16unresponsive on wake up assessment, becomes tachypneic when sedation is lightened 5/17no purposeful movement on wake up  assessment, becomes tachypneic, asynchronous with vent 5/18 remains critically ill, severe brain damage, patient made DNR   MICRO DATA 5/7STAPHYLOCOCCUS HOMINIS1/4 cultures MRSA NEG 5/8 sputum cx: "normal flora"  COVID And INF A/B NEG    Interim History / Subjective:  Remains critically ill Remains intubated Severe brain damage Patient was made DNR by family last night +multiorgan failure      Objective   Blood pressure (!) 168/48, pulse (!) 56, temperature 98.8 F (37.1 C), temperature source Oral, resp. rate 18, height 6' 0.76" (1.848 m), weight 122.5 kg, SpO2 98 %.    Vent Mode: PRVC FiO2 (%):  [28 %] 28 % Set Rate:  [16 bmp] 16 bmp Vt Set:  [550 mL] 550 mL PEEP:  [5 cmH20] 5 cmH20 Plateau Pressure:  [15 cmH20-19 cmH20] 15 cmH20   Intake/Output Summary (Last 24 hours) at 01/06/2021 0723 Last data filed at 01/06/2021 0700 Gross per 24 hour  Intake 2148.72 ml  Output 2920 ml  Net -771.28 ml   Filed Weights   01/03/21 0338 01/04/21 0449 01/05/21 0355  Weight: 127.9 kg 122.5 kg 122.5 kg      REVIEW OF SYSTEMS  PATIENT IS UNABLE TO PROVIDE COMPLETE REVIEW OF SYSTEMS DUE TO SEVERE CRITICAL ILLNESS AND TOXIC METABOLIC ENCEPHALOPATHY   PHYSICAL EXAMINATION:  GENERAL:critically ill appearing, +resp distress HEAD: Normocephalic, atraumatic.  EYES: No scleral icterus.  MOUTH: Moist mucosal membrane. NECK: Supple. PULMONARY: +rhonchi, +wheezing CARDIOVASCULAR: S1 and S2. Regular rate and rhythm. No murmurs, rubs, or gallops.  GASTROINTESTINAL: Soft, nontender, -distended. Positive bowel sounds.  MUSCULOSKELETAL: +edema.  NEUROLOGIC: obtunded SKIN:intact,warm,dry   Labs/imaging that I havepersonally reviewed  (right click and "Reselect all SmartList Selections" daily)       ASSESSMENT AND PLAN SYNOPSIS   60 yo morbidly obese AAM with acute and  severe cardiac arrest NSTEMI with acute severe systolic cardiac failure with signs and symptoms of  severe anoxic encephalopathy.  PATIENT IS NOT IMPROVING, PATIENT WITH MULTIORGAN FAILURE AND VERY POOR CHANCE OF MEANINGFUL RECOVERY   Severe ACUTE Hypoxic and Hypercapnic Respiratory Failure -continue Mechanical Ventilator support -continue Bronchodilator Therapy -Wean Fio2 and PEEP as tolerated -VAP/VENT bundle implementation Patient will not be abel to wean from vent Will need artifical support to live     CARDIAC FAILURE-acute  Systolic dysfunction -oxygen as needed   CARDIAC ICU monitoring   ACUTE KIDNEY INJURY/Renal Failure -continue Foley Catheter-assess need -Avoid nephrotoxic agents -Follow urine output, BMP -Ensure adequate renal perfusion, optimize oxygenation -Renal dose medications   NEUROLOGY Acute toxic metabolic encephalopathy, need for sedation Goal RASS -2 to -3 Severe brain damage  SHOCK-NEUROGENIC SEVERE HIE -use vasopressors to keep MAP>65 as needed   ENDO - ICU hypoglycemic\Hyperglycemia protocol -check FSBS per protocol   GI GI PROPHYLAXIS as indicated  NUTRITIONAL STATUS DIET-->TF's as tolerated Constipation protocol as indicated   ELECTROLYTES -follow labs as needed -replace as needed -pharmacy consultation and following      Labs   CBC: Recent Labs  Lab 01/02/21 0406 01/03/21 0354 01/04/21 0507 01/05/21 0348 01/06/21 0416  WBC 9.3 9.9 9.6 10.3 11.7*  NEUTROABS  --   --   --  6.2 7.2  HGB 9.4* 10.0* 9.9* 10.4* 11.2*  HCT 30.3* 31.7* 32.4* 33.2* 34.6*  MCV 80.6 78.9* 81.8 79.6* 80.5  PLT 321 393 439* 466* 569*    Basic Metabolic Panel: Recent Labs  Lab 01/02/21 0406 01/03/21 0354 01/04/21 0507 01/05/21 0348 01/06/21 0559  NA 141 141 142 142 143  K 4.6 4.6 4.9 4.6 5.0  CL 114* 116* 114* 116* 115*  CO2 20* 19* 20* 19* 20*  GLUCOSE 118* 120* 122* 130* 201*  BUN 38* 38* 48* 48* 40*  CREATININE 1.44* 1.37* 1.50* 1.30* 1.35*  CALCIUM 8.4* 8.6* 8.7* 8.8* 9.1  MG 2.5* 2.3 2.5* 2.4 2.3  PHOS 4.4 4.4 5.0*  4.4 4.0   GFR: Estimated Creatinine Clearance: 79.5 mL/min (A) (by C-G formula based on SCr of 1.35 mg/dL (H)). Recent Labs  Lab 01/03/21 0354 01/04/21 0507 01/05/21 0348 01/06/21 0416 01/06/21 0559  PROCALCITON  --  0.38 0.24  --  0.19  WBC 9.9 9.6 10.3 11.7*  --     Liver Function Tests: Recent Labs  Lab 01/04/21 0507  ALBUMIN 2.4*   No results for input(s): LIPASE, AMYLASE in the last 168 hours. No results for input(s): AMMONIA in the last 168 hours.  ABG    Component Value Date/Time   PHART 7.37 12/29/2020 1032   PCO2ART 35 12/29/2020 1032   PO2ART 81 (L) 12/29/2020 1032   HCO3 20.2 12/29/2020 1032   ACIDBASEDEF 4.5 (H) 12/29/2020 1032   O2SAT 95.5 12/29/2020 1032     Coagulation Profile: No results for input(s): INR, PROTIME in the last 168 hours.  Cardiac Enzymes: No results for input(s): CKTOTAL, CKMB, CKMBINDEX, TROPONINI in the last 168 hours.  HbA1C: Hgb A1c MFr Bld  Date/Time Value Ref Range Status  12/25/2020 04:46 PM 5.9 (H) 4.8 - 5.6 % Final    Comment:    (NOTE) Pre diabetes:          5.7%-6.4%  Diabetes:              >6.4%  Glycemic control for   <7.0% adults with diabetes     CBG: Recent Labs  Lab 01/05/21  1614 01/05/21 1935 01/05/21 2353 01/06/21 0322 01/06/21 0722  GLUCAP 159* 181* 185* 181* 170*    Allergies Not on File     DVT/GI PRX  assessed I Assessed the need for Labs I Assessed the need for Foley I Assessed the need for Central Venous Line Family Discussion when available I Assessed the need for Mobilization I made an Assessment of medications to be adjusted accordingly Safety Risk assessment completed  CASE DISCUSSED IN MULTIDISCIPLINARY ROUNDS WITH ICU TEAM     Critical Care Time devoted to patient care services described in this note is 65  minutes.  Critical care was necessary to treat or prevent imminent or life-threatening deterioration. Overall, patient is critically ill, prognosis is guarded.   Patient with Multiorgan failure and at high risk for cardiac arrest and death.   Patient is DNR now   Lucie Leather, M.D.  [facility]  Medical Director [facility] Medical Director [facility] Cardio-Pulmonary Department

## 2021-01-06 NOTE — Progress Notes (Signed)
PHARMACY CONSULT NOTE  Pharmacy Consult for Electrolyte Monitoring and Replacement   Recent Labs: Potassium (mmol/L)  Date Value  01/06/2021 5.0   Magnesium (mg/dL)  Date Value  46/56/8127 2.3   Calcium (mg/dL)  Date Value  51/70/0174 9.1   Albumin (g/dL)  Date Value  94/49/6759 2.4 (L)   Phosphorus (mg/dL)  Date Value  16/38/4665 4.0   Sodium (mmol/L)  Date Value  01/06/2021 143   Corrected calcium:  9.4 mg/dL  Assessment:  60 year old male with obesity, diabetes mellitus, hypertension, and combined diastolic and systolic heart failure who presented to the emergency department yesterday in respiratory distress admitted following witnessed cardiac arrest. Pt is S/P CPR. Remains intubated. Pharmacy consulted for electrolyte monitoring and replacement.  PROSource 45mL TID Vital High Protein @ 72ml/hr Free water 29mL q4h  Goal of Therapy:  Electrolytes WNL  Plan:   No replacement indicated at this time.  Will follow with AM labs and replace as needed.  Pricilla Riffle, PharmD Clinical Pharmacist 01/06/2021 12:30 PM

## 2021-01-06 NOTE — Progress Notes (Signed)
GOALS OF CARE DISCUSSION  The Clinical status was relayed to family in detail. Sister In law Joni Reining updated  Updated and notified of patients medical condition.    Patient remains unresponsive and will not open eyes to command.   Patient is having a weak cough and struggling to remove secretions.   Patient with increased WOB and using accessory muscles to breathe Explained to family course of therapy and the modalities    Patient with Progressive multiorgan failure with a very high probablity of a very minimal chance of meaningful recovery despite all aggressive and optimal medical therapy.   Family understands the situation.  They have consented and agreed to DNR/DNI  Patient with decreased HR +Proteus infection/pnuemonia +Septic shock on pressors  It is possibility that patient could die in next 12-24 hrs   Family are satisfied with Plan of action and management. All questions answered  Additional CC time 35 mins   Juwan Vences Santiago Glad, M.D.  [facility]  Medical Director [facility] Medical Director [facility] Cardio-Pulmonary Department

## 2021-01-06 NOTE — Progress Notes (Signed)
Nutrition Follow Up Note   DOCUMENTATION CODES:   Obesity unspecified  INTERVENTION:   Change to Vital 1.5_0 /hr + Pro-Source TF 39m QID via tube  Free water flushes 345mq4 hours to maintain tube patency   Regimen provides 1940kcal/day, 161g/day protein and 100539may free water   Liquid MVI daily via tube   NUTRITION DIAGNOSIS:   Inadequate oral intake related to inability to eat (pt sedated and ventilated) as evidenced by NPO status.  GOAL:   Provide needs based on ASPEN/SCCM guidelines  -met with tube feeds   MONITOR:   Vent status,Labs,Weight trends,TF tolerance,Skin,I & O's  ASSESSMENT:   [age]o. black male with diabetes mellitus type II, hypertension and congestive heart failure who [facility] for cardiogenic shock and flash pulmonary edema  Pt sedated and ventilated. OGT in place; pt tolerating tube feeds well at goal rate. Will adjust tube feeds as propofol discontinued. Pt with brain damage per MD note, palliative care following. Per chart, pt is fairly weight stable since admit.   Medications reviewed and include: colace, lovenox, pepcid, insulin, miralax, ceftriaxone, fentanyl, levophed  Labs reviewed: K 4.6 wnl, BUN 38(H), creat 1.37(H), P 4.4 wnl, Mg 2.3 wnl Hgb 10.0(L), Hct 31.7(L), MCV 78.9(L), MCH 24.9(L) cbgs- 92, 108, 105 x 24 hrs  Patient is currently intubated on ventilator support MV: 12.0 L/min Temp (24hrs), Avg:99 F (37.2 C), Min:98.6 F (37 C), Max:99.7 F (37.6 C)  Propofol: none   MAP- >21m19m  UOP- [id number] Order:   Diet Order            Diet NPO time specified  Diet effective now                EDUCATION NEEDS:   No education needs have been identified at this time  Skin:  Skin Assessment: Reviewed RN Assessment  Last BM:  5/18- type 6  Height:   Ht Readings from Last 1 Encounters:  01/06/21 6' 0.76" (1.848 m)    Weight:   Wt Readings from Last 1 Encounters:  01/05/21 122.5 kg     BMI:  Body mass index is 35.87 kg/m.  Estimated Nutritional Needs:   Kcal:  1406-1790kcal/day  Protein:  >160g/day  Fluid:  2.4-2.7L/day  CaseyKoleen DistanceRD, LDN Please refer to [facility] and/or RD on-call/weekend/after hours pager

## 2021-01-07 ENCOUNTER — Inpatient Hospital Stay: Payer: Medicaid Other

## 2021-01-07 DIAGNOSIS — I469 Cardiac arrest, cause unspecified: Secondary | ICD-10-CM | POA: Diagnosis not present

## 2021-01-07 DIAGNOSIS — J9601 Acute respiratory failure with hypoxia: Secondary | ICD-10-CM | POA: Diagnosis not present

## 2021-01-07 DIAGNOSIS — I428 Other cardiomyopathies: Secondary | ICD-10-CM

## 2021-01-07 LAB — CBC WITH DIFFERENTIAL/PLATELET
Abs Immature Granulocytes: 0.06 10*3/uL (ref 0.00–0.07)
Basophils Absolute: 0.1 10*3/uL (ref 0.0–0.1)
Basophils Relative: 1 %
Eosinophils Absolute: 0.5 10*3/uL (ref 0.0–0.5)
Eosinophils Relative: 5 %
HCT: 37.4 % — ABNORMAL LOW (ref 39.0–52.0)
Hemoglobin: 11.3 g/dL — ABNORMAL LOW (ref 13.0–17.0)
Immature Granulocytes: 1 %
Lymphocytes Relative: 29 %
Lymphs Abs: 3.2 10*3/uL (ref 0.7–4.0)
MCH: 24.6 pg — ABNORMAL LOW (ref 26.0–34.0)
MCHC: 30.2 g/dL (ref 30.0–36.0)
MCV: 81.5 fL (ref 80.0–100.0)
Monocytes Absolute: 0.9 10*3/uL (ref 0.1–1.0)
Monocytes Relative: 8 %
Neutro Abs: 6.1 10*3/uL (ref 1.7–7.7)
Neutrophils Relative %: 56 %
Platelets: 560 10*3/uL — ABNORMAL HIGH (ref 150–400)
RBC: 4.59 MIL/uL (ref 4.22–5.81)
RDW: 16.7 % — ABNORMAL HIGH (ref 11.5–15.5)
WBC: 10.8 10*3/uL — ABNORMAL HIGH (ref 4.0–10.5)
nRBC: 0 % (ref 0.0–0.2)

## 2021-01-07 LAB — GLUCOSE, CAPILLARY
Glucose-Capillary: 138 mg/dL — ABNORMAL HIGH (ref 70–99)
Glucose-Capillary: 145 mg/dL — ABNORMAL HIGH (ref 70–99)
Glucose-Capillary: 151 mg/dL — ABNORMAL HIGH (ref 70–99)
Glucose-Capillary: 153 mg/dL — ABNORMAL HIGH (ref 70–99)
Glucose-Capillary: 158 mg/dL — ABNORMAL HIGH (ref 70–99)
Glucose-Capillary: 185 mg/dL — ABNORMAL HIGH (ref 70–99)
Glucose-Capillary: 186 mg/dL — ABNORMAL HIGH (ref 70–99)

## 2021-01-07 LAB — BASIC METABOLIC PANEL
Anion gap: 4 — ABNORMAL LOW (ref 5–15)
BUN: 36 mg/dL — ABNORMAL HIGH (ref 6–20)
CO2: 21 mmol/L — ABNORMAL LOW (ref 22–32)
Calcium: 9.2 mg/dL (ref 8.9–10.3)
Chloride: 118 mmol/L — ABNORMAL HIGH (ref 98–111)
Creatinine, Ser: 1.36 mg/dL — ABNORMAL HIGH (ref 0.61–1.24)
GFR, Estimated: 60 mL/min — ABNORMAL LOW (ref >60)
Glucose, Bld: 210 mg/dL — ABNORMAL HIGH (ref 70–99)
Potassium: 5.1 mmol/L (ref 3.5–5.1)
Sodium: 143 mmol/L (ref 135–145)

## 2021-01-07 LAB — AMMONIA: Ammonia: 22 umol/L (ref 9–35)

## 2021-01-07 LAB — PHOSPHORUS: Phosphorus: 3.7 mg/dL (ref 2.5–4.6)

## 2021-01-07 LAB — MAGNESIUM: Magnesium: 2.2 mg/dL (ref 1.7–2.4)

## 2021-01-07 IMAGING — DX DG CHEST 1V PORT
1 series · 1 of 1 positions shown · non-contrast
Comparison: [DATE]

CLINICAL DATA: Status post re-intubation

EXAM:
PORTABLE CHEST 1 VIEW

[chest ap]
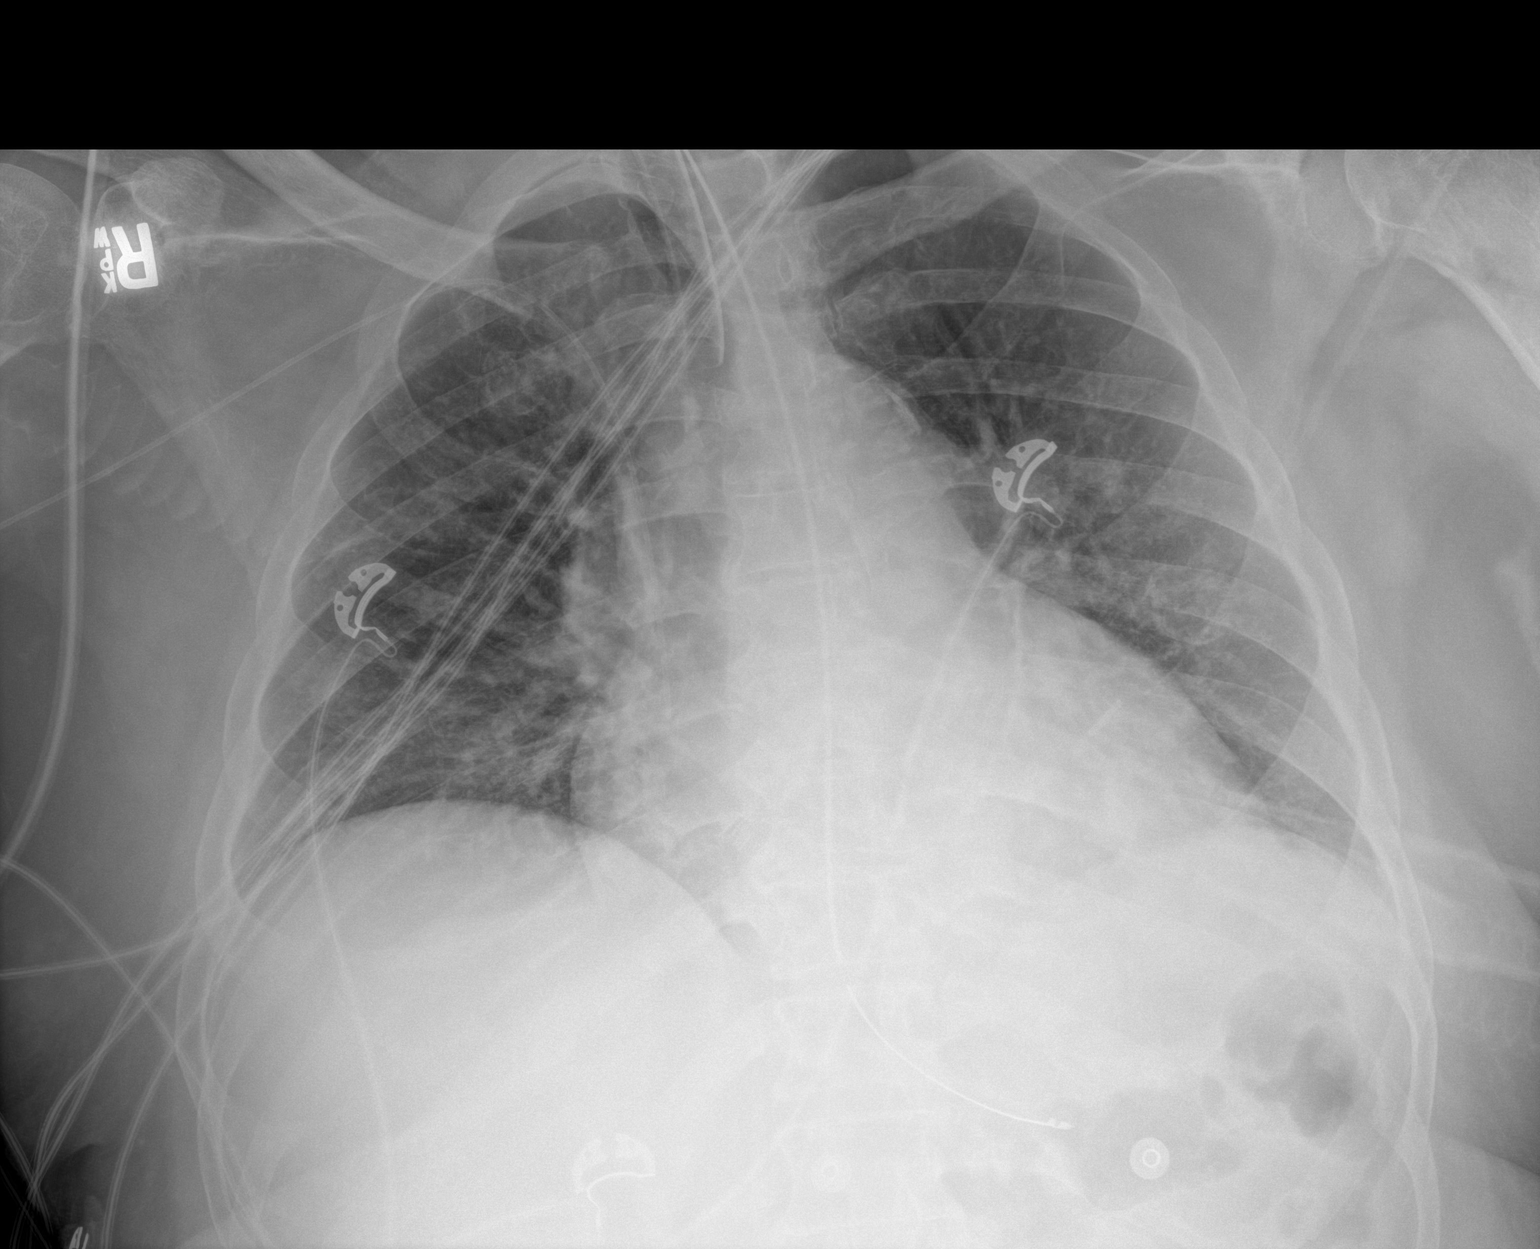

[1 of 1 positions shown; findings below may reference images not displayed]

FINDINGS: Cardiac shadow is prominent but accentuated by the portable
technique. Endotracheal tube and gastric catheter are noted in
place. Gastric catheter should be advanced deeper into the stomach
as the proximal side port lies in the distal esophagus. Right-sided
PICC line is noted at the cavoatrial junction. Lungs are well
aerated bilaterally without focal infiltrate.
IMPRESSION: Tubes and lines as described above.  No acute abnormality noted.

## 2021-01-07 IMAGING — DX DG ABDOMEN 1V
1 series · 1 of 1 positions shown · non-contrast
Comparison: [DATE]

CLINICAL DATA: Check gastric catheter placement

EXAM:
ABDOMEN - 1 VIEW

[abdomen supine]
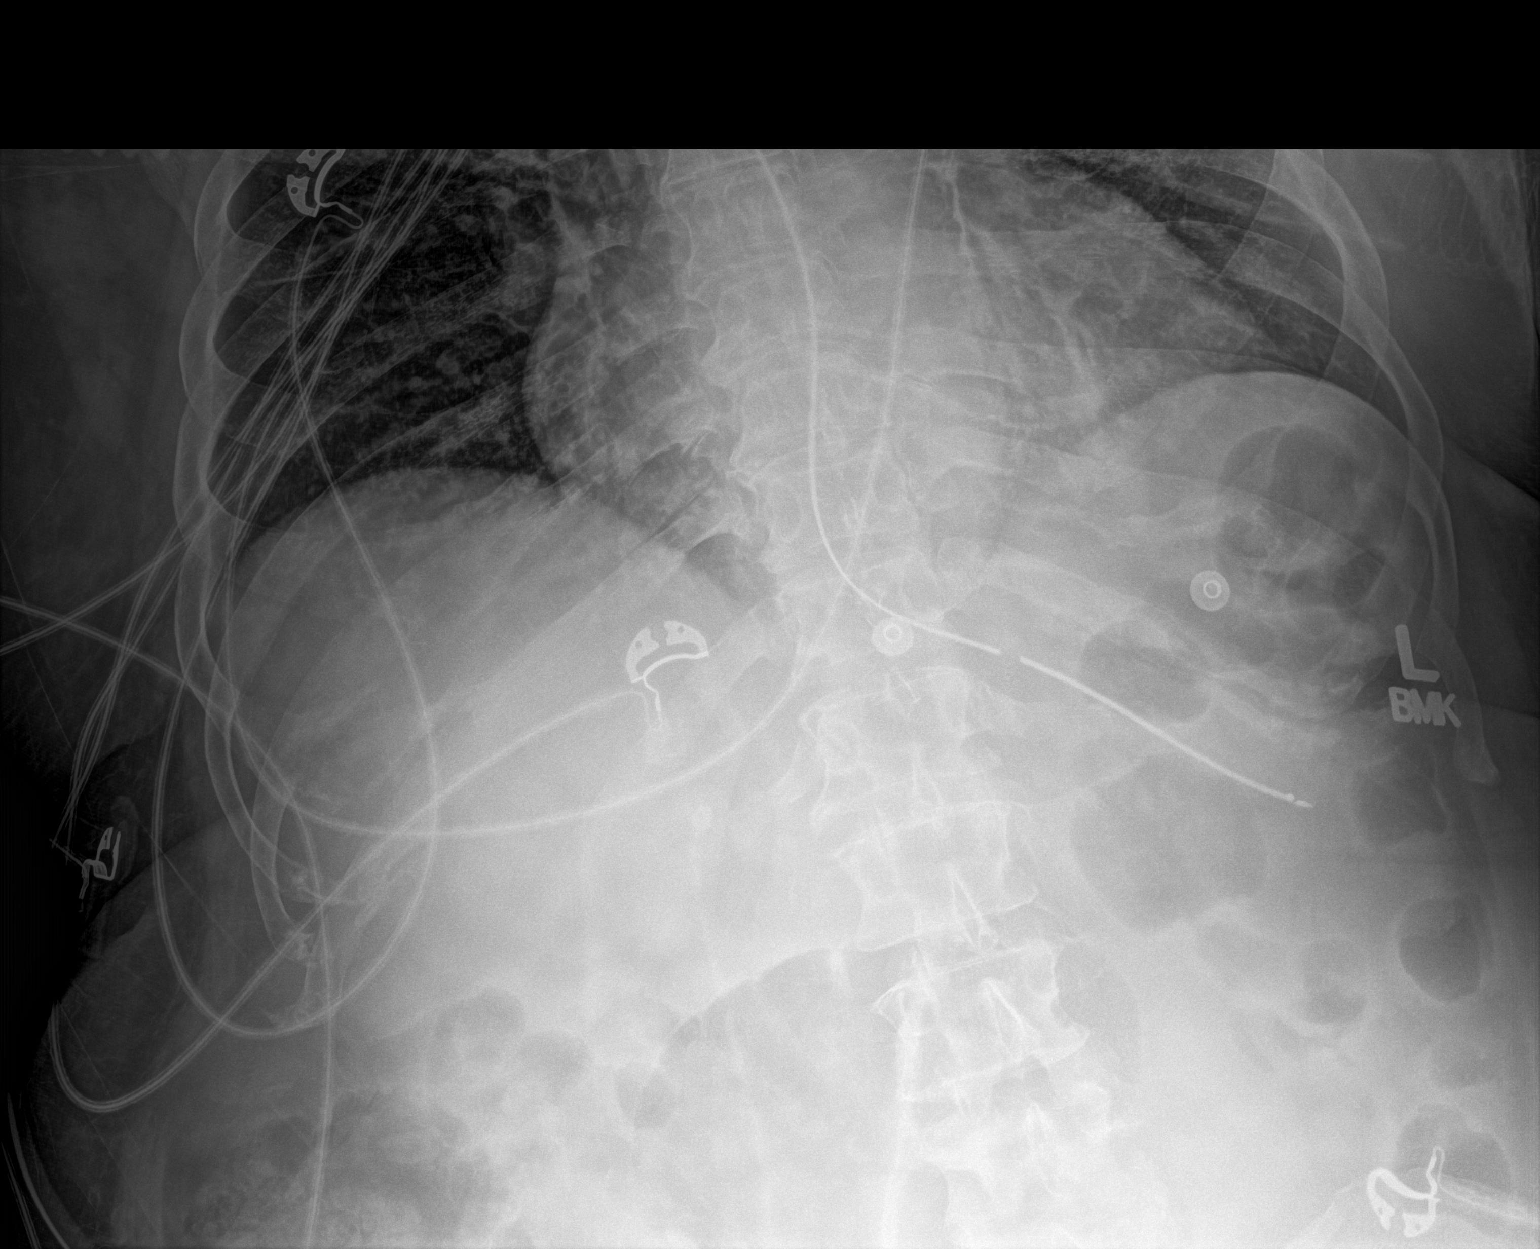

[1 of 1 positions shown; findings below may reference images not displayed]

FINDINGS: Gastric catheter is noted in satisfactory position within the
stomach. The overall appearance is similar to that seen on prior
examination.
IMPRESSION: Gastric catheter within the stomach.

## 2021-01-07 MED ORDER — VECURONIUM BROMIDE 10 MG IV SOLR
10.0000 mg | Freq: Once | INTRAVENOUS | Status: AC
  Administered 2021-01-07: 10 mg via INTRAVENOUS
  Filled 2021-01-07: qty 10

## 2021-01-07 MED ORDER — MIDAZOLAM HCL 2 MG/2ML IJ SOLN
1.0000 mg | Freq: Once | INTRAMUSCULAR | Status: AC
  Administered 2021-01-07: 1 mg via INTRAVENOUS
  Filled 2021-01-07: qty 2

## 2021-01-07 MED ORDER — FUROSEMIDE 10 MG/ML IJ SOLN
40.0000 mg | Freq: Once | INTRAMUSCULAR | Status: AC
  Administered 2021-01-07: 40 mg via INTRAVENOUS
  Filled 2021-01-07: qty 4

## 2021-01-07 NOTE — Progress Notes (Signed)
PHARMACY CONSULT NOTE  Pharmacy Consult for Electrolyte Monitoring and Replacement   Recent Labs: Potassium (mmol/L)  Date Value  01/07/2021 5.1   Magnesium (mg/dL)  Date Value  00/93/8182 2.2   Calcium (mg/dL)  Date Value  99/37/1696 9.2   Albumin (g/dL)  Date Value  78/93/8101 2.4 (L)   Phosphorus (mg/dL)  Date Value  75/05/2584 3.7   Sodium (mmol/L)  Date Value  01/07/2021 143   Corrected calcium:  9.4 mg/dL  Assessment:  60 year old male with obesity, diabetes mellitus, hypertension, and combined diastolic and systolic heart failure who presented to the emergency department yesterday in respiratory distress admitted following witnessed cardiac arrest. Pt is S/P CPR. Remains intubated. Pharmacy consulted for electrolyte monitoring and replacement.  PROSource 58mL TID Vital High Protein @ 69ml/hr Free water 45mL q4h  Goal of Therapy:  Electrolytes WNL  Plan:   No replacement indicated at this time.  Will follow with AM labs and replace as needed.  Pricilla Riffle, PharmD Clinical Pharmacist 01/07/2021 12:14 PM

## 2021-01-07 NOTE — Progress Notes (Signed)
Advanced OG 5cm. Per Dr Jayme Cloud no kUB needed,

## 2021-01-07 NOTE — Progress Notes (Signed)
ETT exchanged by Dr Jayme Cloud due to cuff leak. #7 ETT removed and #8 ETT placed. Pt tolerated procedure well. + color change on CO2 detector. Pt placed back on vent on resting settings.

## 2021-01-07 NOTE — Procedures (Signed)
Endotracheal Intubation: Patient developed cuff leak on existing ET tube inability to ventilate with cuff leak.  Consent: Emergent.   Hand washing performed prior to starting the procedure.   Medications administered for sedation prior to procedure:  Midazolam 1 mg IV,  Vecuronium 10 mg IV, patient on fentanyl infusion   A time out procedure was called and correct patient, name, & ID confirmed. Needed supplies and equipment were assembled and checked to include ETT, 10 ml syringe, Glidescope, Mac and Miller blades, suction, oxygen and bag mask valve, end tidal CO2 monitor and tube exchanger.   Patient preoxygenated 100% FiO2 via existing ET tube.  Heart rate, SpO2 and blood pressure was continuously monitored during the procedure.   Patient was transiently disconnected from the ventilator.  A tube exchanger was utilized and introduced in the existing ET tube which was a size # 7.0.  The correct placement of the tube exchanger was noted under direct visualization via glidescope # 4 blade.  The defective ET tube was removed and under direct visualization the new # 8.0 ET tube was slid over the tube exchanger through the vocal cords without trauma and without difficulty.  Cuff was inflated and the patient was reconnected to the ventilator.  ETT was secured at 24 cm mark the teeth.  Placement was confirmed by direct visualization as above, auscuitation of lungs with good breath sounds bilaterally and no epigastric sounds.  Condensation was noted on endotracheal tube.  CO2 detector showed appropriate color change. Pulse ox 98%.  Complications: None .   Operator: Patsey Berthold  Chest radiograph ordered and pending.   Renold Don, MD [facility]   *This note was dictated using voice recognition software/Dragon.  Despite best efforts to proofread, errors can occur which can change the meaning.  Any change was purely unintentional.

## 2021-01-07 NOTE — Progress Notes (Signed)
NAME:  Jeremy Browning, MRN:  [id number], DOB:  [birth year], LOS: 24 ADMISSION DATE:  12/25/2020 60 yo M presenting to [facility] ED from home via EMS with respiratory distress that had been progressively worsening over the course of 12/25/20. Per documentation family stated the patient was in his normal state of health until this morning when he began feeling unwell. He then called out stating he could not breathe. EMS found the patient hypoxic and in distress with SpO2 in the 70's on room air. Patient was place on CPAP, arriving to the ED unresponsive and pulseless. ED course: Upon arrival to ED patient was defibrillated x 1 and received CPR before achieving ROSC. Initial EKG suggestive of global ischemia, second EKG had possible LBBB vs IVCD. Patient was emergently intubated requiring mechanical ventilation. Per ED documentation STEMI team activated in the setting of a witnessed shockable cardiac arrest with clinical exam highly suggestive of pulmonary edema with pink frothy sputum throughout ET tubing.  Pertinent Medical History  Obesity Hypertension Diabetes mellitus Chronic renal insufficiency Combined systolic and diastolic heart failure  Significant Hospital Events: Including procedures, antibiotic start and stop dates in addition to other pertinent events   Cardiac catheterization on presentation with normal coronaries, good cardiac output, elevated pulmonary capillary wedge pressure.  5/7 severe resp failure 5/8remains on Vent 5/9remains on vent , multiorgan failureMRI shows brain injury 5/10remains on vent, multiorgan failure, brother updated 5/11severe resp failure 5/25filed weaning trials due to severe brain damage 5/151fled weaning trials severe hypoxia dn vent dyssynchrony, sister updated 5/14vent support, severe brain damage, prognosis is poor 5/16unresponsive on wake up assessment, becomes tachypneic when sedation is lightened 5/17no purposeful movement on wake up  assessment, becomes tachypneic, asynchronous with vent 5/18 remains critically ill, severe brain damage, patient made DNR 5/20 remains unresponsive, critically ill, pressor dependent, GOC being discussed with family    Results for orders placed or performed during the hospital encounter of 12/25/20  Culture, blood (routine x 2)     Status: Abnormal   Collection Time: 12/25/20  2:55 PM   Specimen: BLOOD  Result Value Ref Range Status   Specimen Description   Final    BLOOD BLOOD RIGHT HAND Performed at [facility] [place] [id number]  Special Requests   Final    BOTTLES DRAWN AEROBIC AND ANAEROBIC Blood Culture adequate volume Performed at [facility] [place] [address]  Culture  Setup Time   Final    GRAM POSITIVE COCCI AEROBIC BOTTLE ONLY CRITICAL RESULT CALLED TO, READ BACK BY AND VERIFIED WITH: MORGAN CUNNIGHAN AT [id number] 12/26/20 BY SS    Culture (A)  Final    STAPHYLOCOCCUS HOMINIS THE SIGNIFICANCE OF ISOLATING THIS ORGANISM FROM A SINGLE SET OF BLOOD CULTURES WHEN MULTIPLE SETS ARE DRAWN IS UNCERTAIN. PLEASE NOTIFY THE MICROBIOLOGY DEPARTMENT WITHIN ONE WEEK IF SPECIATION AND SENSITIVITIES ARE REQUIRED. Performed at [facility] [place] [address]  Report Status 12/28/2020 FINAL  Final  Blood Culture ID Panel (Reflexed)     Status: Abnormal   Collection Time: 12/25/20  2:55 PM  Result Value Ref Range Status   Enterococcus faecalis NOT DETECTED NOT DETECTED Final   Enterococcus Faecium NOT DETECTED NOT DETECTED Final   Listeria monocytogenes NOT DETECTED NOT DETECTED Final   Staphylococcus species DETECTED (A) NOT DETECTED Final    Comment: CRITICAL RESULT CALLED TO, READ BACK BY AND VERIFIED  WITH: Brayton Mars AT 1159 ON 12/26/20 BY SS    Staphylococcus aureus (BCID) NOT DETECTED NOT DETECTED Final   Staphylococcus epidermidis NOT DETECTED NOT DETECTED Final    Staphylococcus lugdunensis NOT DETECTED NOT DETECTED Final   Streptococcus species NOT DETECTED NOT DETECTED Final   Streptococcus agalactiae NOT DETECTED NOT DETECTED Final   Streptococcus pneumoniae NOT DETECTED NOT DETECTED Final   Streptococcus pyogenes NOT DETECTED NOT DETECTED Final   A.calcoaceticus-baumannii NOT DETECTED NOT DETECTED Final   Bacteroides fragilis NOT DETECTED NOT DETECTED Final   Enterobacterales NOT DETECTED NOT DETECTED Final   Enterobacter cloacae complex NOT DETECTED NOT DETECTED Final   Escherichia coli NOT DETECTED NOT DETECTED Final   Klebsiella aerogenes NOT DETECTED NOT DETECTED Final   Klebsiella oxytoca NOT DETECTED NOT DETECTED Final   Klebsiella pneumoniae NOT DETECTED NOT DETECTED Final   Proteus species NOT DETECTED NOT DETECTED Final   Salmonella species NOT DETECTED NOT DETECTED Final   Serratia marcescens NOT DETECTED NOT DETECTED Final   Haemophilus influenzae NOT DETECTED NOT DETECTED Final   Neisseria meningitidis NOT DETECTED NOT DETECTED Final   Pseudomonas aeruginosa NOT DETECTED NOT DETECTED Final   Stenotrophomonas maltophilia NOT DETECTED NOT DETECTED Final   Candida albicans NOT DETECTED NOT DETECTED Final   Candida auris NOT DETECTED NOT DETECTED Final   Candida glabrata NOT DETECTED NOT DETECTED Final   Candida krusei NOT DETECTED NOT DETECTED Final   Candida parapsilosis NOT DETECTED NOT DETECTED Final   Candida tropicalis NOT DETECTED NOT DETECTED Final   Cryptococcus neoformans/gattii NOT DETECTED NOT DETECTED Final    Comment: Performed at [facility], [address] [place], [place] [address]  Resp Panel by RT-PCR (Flu A&B, Covid) Nasopharyngeal Swab     Status: None   Collection Time: 12/25/20  5:51 PM   Specimen: Nasopharyngeal Swab; Nasopharyngeal(NP) swabs in vial transport medium  Result Value Ref Range Status   SARS Coronavirus 2 by RT PCR NEGATIVE NEGATIVE Final    Comment: (NOTE) SARS-CoV-2 target  nucleic acids are NOT DETECTED.  The SARS-CoV-2 RNA is generally detectable in upper respiratory specimens during the acute phase of infection. The lowest concentration of SARS-CoV-2 viral copies this assay can detect is 138 copies/mL. A negative result does not preclude SARS-Cov-2 infection and should not be used as the sole basis for treatment or other patient management decisions. A negative result may occur with  improper specimen collection/handling, submission of specimen other than nasopharyngeal swab, presence of viral mutation(s) within the areas targeted by this assay, and inadequate number of viral copies(<138 copies/mL). A negative result must be combined with clinical observations, patient history, and epidemiological information. The expected result is Negative.  Fact Sheet for Patients:  EntrepreneurPulse.com.au  Fact Sheet for Healthcare Providers:  IncredibleEmployment.be  This test is no t yet approved or cleared by the [place] FDA and  has been authorized for detection and/or diagnosis of SARS-CoV-2 by FDA under an Emergency Use Authorization (EUA). This EUA will remain  in effect (meaning this test can be used) for the duration of the COVID-19 declaration under Section 564(b)(1) of the Act, 21 U.S.C.section 360bbb-3(b)(1), unless the authorization is terminated  or revoked sooner.       Influenza A by PCR NEGATIVE NEGATIVE Final   Influenza B by PCR NEGATIVE NEGATIVE Final    Comment: (NOTE) The Xpert Xpress SARS-CoV-2/FLU/RSV plus assay is intended as an aid in the diagnosis of influenza from Nasopharyngeal swab specimens and should not be  used as a sole basis for treatment. Nasal washings and aspirates are unacceptable for Xpert Xpress SARS-CoV-2/FLU/RSV testing.  Fact Sheet for Patients: EntrepreneurPulse.com.au  Fact Sheet for Healthcare  Providers: IncredibleEmployment.be  This test is not yet approved or cleared by the [place] FDA and has been authorized for detection and/or diagnosis of SARS-CoV-2 by FDA under an Emergency Use Authorization (EUA). This EUA will remain in effect (meaning this test can be used) for the duration of the COVID-19 declaration under Section 564(b)(1) of the Act, 21 U.S.C. section 360bbb-3(b)(1), unless the authorization is terminated or revoked.  Performed at [facility], [address] [place], [place] [address]   MRSA PCR Screening     Status: None   Collection Time: 12/25/20  6:39 PM   Specimen: Nasopharyngeal  Result Value Ref Range Status   MRSA by PCR NEGATIVE NEGATIVE Final    Comment:        The GeneXpert MRSA Assay (FDA approved for NASAL specimens only), is one component of a comprehensive MRSA colonization surveillance program. It is not intended to diagnose MRSA infection nor to guide or monitor treatment for MRSA infections. Performed at [facility], [address] [place], [place] [address]   Culture, blood (routine x 2)     Status: None   Collection Time: 12/25/20  6:43 PM   Specimen: BLOOD  Result Value Ref Range Status   Specimen Description BLOOD Blood Culture adequate volume  Final   Special Requests   Final    BOTTLES DRAWN AEROBIC AND ANAEROBIC LEFT ANTECUBITAL   Culture   Final    NO GROWTH 5 DAYS Performed at [facility], [address], [place], [place] [address]    Report Status 12/30/2020 FINAL  Final  Culture, Respiratory w Gram Stain     Status: None   Collection Time: 12/26/20  6:27 AM   Specimen: Tracheal Aspirate; Respiratory  Result Value Ref Range Status   Specimen Description   Final    TRACHEAL ASPIRATE Performed at [facility], [address], [place], [place] [address]    Special Requests   Final    NONE Performed at [facility], [address]  [place], [place] [address]    Gram Stain   Final    FEW WBC PRESENT,BOTH PMN AND MONONUCLEAR RARE GRAM POSITIVE COCCI IN PAIRS    Culture   Final    RARE Normal respiratory flora-no Staph aureus or Pseudomonas seen Performed at [facility], [place] [address] [place], [place] [address]    Report Status 12/28/2020 FINAL  Final  Aerobic Culture w Gram Stain (superficial specimen)     Status: None   Collection Time: 12/30/20  9:05 AM   Specimen: Wound  Result Value Ref Range Status   Specimen Description WOUND LEFT LEG  Final   Special Requests NONE  Final   Gram Stain   Final    ABUNDANT WBC PRESENT,BOTH PMN AND MONONUCLEAR NO ORGANISMS SEEN    Culture   Final    RARE NORMAL SKIN FLORA Performed at [facility], [place] [address], [place], [place] [address]    Report Status 01/01/2021 FINAL  Final  Culture, Respiratory w Gram Stain     Status: None   Collection Time: 01/04/21 11:36 AM   Specimen: Tracheal Aspirate; Respiratory  Result Value Ref Range Status   Specimen Description   Final    TRACHEAL ASPIRATE Performed at [facility], [address] [place], [place] [address]    Special Requests   Final    Normal Performed at [facility], [address] [place], [place] [address]    Gram Stain   Final    RARE WBC PRESENT, PREDOMINANTLY PMN NO ORGANISMS SEEN Performed at [facility], [place] [address], [place], [place] [address]    Culture RARE PROTEUS MIRABILIS  Final   Report Status 01/06/2021 FINAL  Final   Organism ID, Bacteria PROTEUS MIRABILIS  Final      Susceptibility   Proteus mirabilis - MIC*    AMPICILLIN <=2 SENSITIVE Sensitive     CEFAZOLIN <=4 SENSITIVE Sensitive     CEFEPIME <=0.12 SENSITIVE Sensitive     CEFTAZIDIME <=1 SENSITIVE Sensitive     CEFTRIAXONE <=0.25 SENSITIVE Sensitive     CIPROFLOXACIN <=0.25 SENSITIVE Sensitive     GENTAMICIN <=1 SENSITIVE Sensitive     IMIPENEM 4 SENSITIVE Sensitive     TRIMETH/SULFA <=20  SENSITIVE Sensitive     AMPICILLIN/SULBACTAM <=2 SENSITIVE Sensitive     PIP/TAZO <=4 SENSITIVE Sensitive     * RARE PROTEUS MIRABILIS       Interim History / Subjective:  Remains critically ill Remains intubated Does not track, does not follow commands Erratic blink to threat Patient was made DNR by family +multiorgan failure Pressor dependent      Objective   Blood pressure (!) 131/46, pulse 70, temperature 99 F (37.2 C), resp. rate 17, height 6' 0.76" (1.848 m), weight 113.1 kg, SpO2 100 %.    Vent Mode: PRVC FiO2 (%):  [28 %] 28 % Set Rate:  [16 bmp] 16 bmp Vt Set:  [550 mL] 550 mL PEEP:  [5 cmH20] 5 cmH20   Intake/Output Summary (Last 24 hours) at 01/07/2021 0847 Last data filed at 01/07/2021 3662 Gross per 24 hour  Intake 1066.21 ml  Output 1485 ml  Net -418.79 ml   Filed Weights   01/04/21 0449 01/05/21 0355 01/07/21 0432  Weight: 122.5 kg 122.5 kg 113.1 kg      REVIEW OF SYSTEMS  PATIENT IS UNABLE TO PROVIDE COMPLETE REVIEW OF SYSTEMS DUE TO SEVERE CRITICAL ILLNESS AND TOXIC METABOLIC ENCEPHALOPATHY   PHYSICAL EXAMINATION:  GENERAL: Obese, unresponsive, intubated mechanically ventilated HEAD: Normocephalic, atraumatic.  EYES: Pupils equal, round, reactive to light.  No scleral icterus.  MOUTH: Orotracheally intubated, OG in place. NECK: Supple. No thyromegaly. Trachea midline. No JVD.  No adenopathy. PULMONARY: Good air entry bilaterally.  Diffuse rhonchi throughout.   CARDIOVASCULAR: S1 and S2. Regular rate and rhythm.  ABDOMEN: Obese, tolerating tube feeds, nondistended. MUSCULOSKELETAL: No joint deformity, no clubbing, 2-3+ anasarca  NEUROLOGIC: Unresponsive, erratic blink to threat, positive Babinski.  Does not fix on examiner, does not track. SKIN: Intact,warm,dry. PSYCH: Unable to assess due to unresponsive state, mechanically ventilated status   Labs/imaging that I havepersonally reviewed  Are as noted below    ASSESSMENT AND  PLAN:  SYNOPSIS 60 yo morbidly obese AAM with acute and severe cardiac arrest NSTEMI with acute severe systolic cardiac failure with signs and symptoms of severe anoxic encephalopathy.   Patient opens eyes but does not track or interact.  He has had little progress with regards to neurologic status.   ACUTE Hypoxic Respiratory Failure  Continue mechanical ventilator support  continue bronchodilator therapy  Wean Fio2 and PEEP as tolerated  Continue VAP/VENT bundle protocol  Patient will not be able to wean from vent without trach  Severe encephalopathy limiting factor  Family now requesting tracheostomy patient  ENT consultation placed   Acute  Systolic Heart Failure Nonischemic cardiomyopathy Secondary pulmonary hypertension  Diuresis as tolerated  Persistent cardiogenic shock requiring low-dose pressors  Consider midodrine if unable to wean off pressors  Proteus mirabilis pneumonia  Continue Rocephin   ACUTE KIDNEY INJURY/Renal Failure  Avoid nephrotoxic agents  Continue Foley catheter for now  Follow urine output monitor renal panel  Ensure adequate renal perfusion  BUN 36/creatinine 1.36   Acute encephalopathy Hypoxic ischemic encephalopathy  Multiple infarcts noted on CT/MRI  Prognosis for meaningful neurologic recovery poor  EEG does not show your activity  ENDO  ICU hypoglycemic\Hyperglycemia protocol  check FSBS per protocol   GI Famotidine for prophylaxis  NUTRITIONAL STATUS Continue tube feeds  DVT prophylaxis Enoxaparin   Scheduled Meds: . chlorhexidine gluconate (MEDLINE KIT)  15 mL Mouth Rinse BID  . Chlorhexidine Gluconate Cloth  6 each Topical Q0600  . docusate  100 mg Per Tube BID  . enoxaparin (LOVENOX) injection  0.5 mg/kg Subcutaneous Q24H  . famotidine  20 mg Per Tube Daily  . feeding supplement (PROSource TF)  90 mL Per Tube QID  . free water  30 mL Per Tube Q4H  . insulin aspart  0-15 Units Subcutaneous  Q4H  . mouth rinse  15 mL Mouth Rinse 10 times per day  . polyethylene glycol  17 g Per Tube Daily   Continuous Infusions: . sodium chloride Stopped (01/05/21 0448)  . sodium chloride    . cefTRIAXone (ROCEPHIN)  IV Stopped (01/06/21 1255)  . feeding supplement (VITAL 1.5 CAL) 1,000 mL (01/06/21 1402)  . fentaNYL infusion INTRAVENOUS 50 mcg/hr (01/07/21 0733)  . norepinephrine (LEVOPHED) Adult infusion 20 mcg/min (01/07/21 0733)  . propofol (DIPRIVAN) infusion Stopped (01/06/21 1336)   PRN Meds:.sodium chloride, acetaminophen (TYLENOL) oral liquid 160 mg/5 mL, albuterol, docusate, fentaNYL (SUBLIMAZE) injection, hydrALAZINE, midazolam, polyethylene glycol, sodium chloride flush, sodium chloride flush    Labs   CBC: Recent Labs  Lab 01/03/21 0354 01/04/21 0507 01/05/21 0348 01/06/21 0416 01/07/21 0440  WBC 9.9 9.6 10.3 11.7* 10.8*  NEUTROABS  --   --  6.2 7.2 6.1  HGB 10.0* 9.9* 10.4* 11.2* 11.3*  HCT 31.7* 32.4* 33.2* 34.6* 37.4*  MCV 78.9* 81.8 79.6* 80.5 81.5  PLT 393 439* 466* 569* 560*    Basic Metabolic Panel: Recent Labs  Lab 01/03/21 0354 01/04/21 0507 01/05/21 0348 01/06/21 0559 01/07/21 0440  NA 141 142 142 143 143  K 4.6 4.9 4.6 5.0 5.1  CL 116* 114* 116* 115* 118*  CO2 19* 20* 19* 20* 21*  GLUCOSE 120* 122* 130* 201* 210*  BUN 38* 48* 48* 40* 36*  CREATININE 1.37* 1.50* 1.30* 1.35* 1.36*  CALCIUM 8.6* 8.7* 8.8* 9.1 9.2  MG 2.3 2.5* 2.4 2.3 2.2  PHOS 4.4 5.0* 4.4 4.0 3.7   GFR: Estimated Creatinine Clearance: 75.8 mL/min (A) (by C-G formula based on SCr of 1.36 mg/dL (H)). Recent Labs  Lab 01/04/21 0507 01/05/21 0348 01/06/21 0416 01/06/21 0559 01/07/21 0440  PROCALCITON 0.38 0.24  --  0.19  --   WBC 9.6 10.3 11.7*  --  10.8*    Liver Function Tests: Recent Labs  Lab 01/04/21 0507  ALBUMIN 2.4*   No results for input(s): LIPASE, AMYLASE in the last 168 hours. No results for input(s): AMMONIA in the last 168 hours.  ABG     Component Value Date/Time   PHART 7.37 12/29/2020 1032   PCO2ART 35 12/29/2020  1032   PO2ART 81 (L) 12/29/2020 1032   HCO3 20.2 12/29/2020 1032   ACIDBASEDEF 4.5 (H) 12/29/2020 1032   O2SAT 95.5 12/29/2020 1032     Coagulation Profile: No results for input(s): INR, PROTIME in the last 168 hours.  Cardiac Enzymes: No results for input(s): CKTOTAL, CKMB, CKMBINDEX, TROPONINI in the last 168 hours.  HbA1C: Hgb A1c MFr Bld  Date/Time Value Ref Range Status  12/25/2020 04:46 PM 5.9 (H) 4.8 - 5.6 % Final    Comment:    (NOTE) Pre diabetes:          5.7%-6.4%  Diabetes:              >6.4%  Glycemic control for   <7.0% adults with diabetes     CBG: Recent Labs  Lab 01/06/21 2001 01/06/21 2351 01/07/21 0015 01/07/21 0400 01/07/21 0720  GLUCAP 152* 142* 145* 185* 158*    Allergies Not on File     DVT/GI PRX  assessed I Assessed the need for Labs I Assessed the need for Foley I Assessed the need for Central Venous Line Family Discussion when available I Assessed the need for Mobilization I made an Assessment of medications to be adjusted accordingly Safety Risk assessment completed  CASE DISCUSSED IN MULTIDISCIPLINARY ROUNDS WITH ICU TEAM    The patient is critically ill with multiple organ systems failure and requires high complexity decision making for assessment and support, frequent evaluation and titration of therapies, application of advanced monitoring technologies and extensive interpretation of multiple databases. Critical Care Time devoted to patient care services described in this note is 45 minutes.  Patient is DNR.  After long discussion with the family members of the family that had not seen him previously, they have now opted to have the patient undergo tracheostomy and gastrostomy tube.  Patient will likely need long-term facility.  Prognosis for meaningful neurologic recovery is poor.   Renold Don, MD [facility]   *This note was  dictated using voice recognition software/Dragon.  Despite best efforts to proofread, errors can occur which can change the meaning.  Any change was purely unintentional.

## 2021-01-07 NOTE — Consult Note (Signed)
..   Kaidon, Kinker [id number] 1961-01-05 Salena Saner, MD  Reason for Consult: tracheostomy tube placement  HPI: 60 y.o. male s/p cardiac arrest and CPR with anoxic brain injury and failure to wean from vent.  Initially present to EMS in respiratory distress and respiratory status declined and required intubation on 5/7.  Neurology was consulted and felt patient had signs of diffuse anoxic injury.  Asked to evaluate for trach for long term ventilatory support.  Allergies: Not on File  ROS: Review of systems normal other than 12 systems except per HPI.  PMH:  Past Medical History:  Diagnosis Date  . Diabetes mellitus type II, non insulin dependent (HCC)   . Essential hypertension Irving Burton like  . Heart failure (HCC)    Unknown details.  By report no cardiac surgery  . Venous stasis dermatitis of left lower extremity     FH:  Family History  Family history unknown: Yes    SH:  Social History   Socioeconomic History  . Marital status: Not on file    Spouse name: Not on file  . Number of children: Not on file  . Years of education: Not on file  . Highest education level: Not on file  Occupational History  . Not on file  Tobacco Use  . Smoking status: Not on file  . Smokeless tobacco: Not on file  Substance and Sexual Activity  . Alcohol use: Not on file  . Drug use: Not on file  . Sexual activity: Not on file  Other Topics Concern  . Not on file  Social History Narrative   He lives with his brother and the brother's family.  Recently moved here to the [place] area.  Has never been established [facility].   Social Determinants of Health   Financial Resource Strain: Not on file  Food Insecurity: Not on file  Transportation Needs: Not on file  Physical Activity: Not on file  Stress: Not on file  Social Connections: Not on file  Intimate Partner Violence: Not on file    PSH:  Past Surgical History:  Procedure Laterality Date  . LEFT HEART CATH AND  CORONARY ANGIOGRAPHY N/A 12/25/2020   Procedure: LEFT HEART CATH AND CORONARY ANGIOGRAPHY;  Surgeon: Marykay Lex, MD;  Location: [facility] INVASIVE CV LAB;  Service: Cardiovascular;  Laterality: N/A;  . RIGHT HEART CATH N/A 12/25/2020   Procedure: RIGHT HEART CATH;  Surgeon: Marykay Lex, MD;  Location: [facility] INVASIVE CV LAB;  Service: Cardiovascular;  Laterality: N/A;  . Unknown      Physical  Exam:  GEN-  Sedated and obtunded without response to commands EARS-  Clear bilaterally NOSE-  Clear anteriorly OC/OP-  Size 8 cuffed ETT in place and secure NECK-  Normal thyroid landmarks, no high riding innominate RESP-  Ventilator support CARD-  RRR  A/P: Respiratory failure with need for long term ventilatory support.  Plan:  Will contact family to get consent and plan for tracheostomy tube placement hopefully Tuesday or Wed of next week.   Bud Face 01/07/2021 4:47 PM

## 2021-01-08 DIAGNOSIS — J9602 Acute respiratory failure with hypercapnia: Secondary | ICD-10-CM | POA: Diagnosis not present

## 2021-01-08 DIAGNOSIS — J9601 Acute respiratory failure with hypoxia: Secondary | ICD-10-CM | POA: Diagnosis not present

## 2021-01-08 LAB — CBC WITH DIFFERENTIAL/PLATELET
Abs Immature Granulocytes: 0.05 10*3/uL (ref 0.00–0.07)
Basophils Absolute: 0.1 10*3/uL (ref 0.0–0.1)
Basophils Relative: 1 %
Eosinophils Absolute: 0.4 10*3/uL (ref 0.0–0.5)
Eosinophils Relative: 4 %
HCT: 34.8 % — ABNORMAL LOW (ref 39.0–52.0)
Hemoglobin: 10.5 g/dL — ABNORMAL LOW (ref 13.0–17.0)
Immature Granulocytes: 1 %
Lymphocytes Relative: 18 %
Lymphs Abs: 2 10*3/uL (ref 0.7–4.0)
MCH: 24.6 pg — ABNORMAL LOW (ref 26.0–34.0)
MCHC: 30.2 g/dL (ref 30.0–36.0)
MCV: 81.7 fL (ref 80.0–100.0)
Monocytes Absolute: 1.1 10*3/uL — ABNORMAL HIGH (ref 0.1–1.0)
Monocytes Relative: 10 %
Neutro Abs: 7.4 10*3/uL (ref 1.7–7.7)
Neutrophils Relative %: 66 %
Platelets: 490 10*3/uL — ABNORMAL HIGH (ref 150–400)
RBC: 4.26 MIL/uL (ref 4.22–5.81)
RDW: 16.8 % — ABNORMAL HIGH (ref 11.5–15.5)
WBC: 10.9 10*3/uL — ABNORMAL HIGH (ref 4.0–10.5)
nRBC: 0 % (ref 0.0–0.2)

## 2021-01-08 LAB — GLUCOSE, CAPILLARY
Glucose-Capillary: 115 mg/dL — ABNORMAL HIGH (ref 70–99)
Glucose-Capillary: 127 mg/dL — ABNORMAL HIGH (ref 70–99)
Glucose-Capillary: 129 mg/dL — ABNORMAL HIGH (ref 70–99)
Glucose-Capillary: 135 mg/dL — ABNORMAL HIGH (ref 70–99)
Glucose-Capillary: 138 mg/dL — ABNORMAL HIGH (ref 70–99)
Glucose-Capillary: 147 mg/dL — ABNORMAL HIGH (ref 70–99)
Glucose-Capillary: 147 mg/dL — ABNORMAL HIGH (ref 70–99)

## 2021-01-08 LAB — POTASSIUM: Potassium: 5 mmol/L (ref 3.5–5.1)

## 2021-01-08 LAB — BASIC METABOLIC PANEL
Anion gap: 6 (ref 5–15)
BUN: 46 mg/dL — ABNORMAL HIGH (ref 6–20)
CO2: 20 mmol/L — ABNORMAL LOW (ref 22–32)
Calcium: 8.9 mg/dL (ref 8.9–10.3)
Chloride: 119 mmol/L — ABNORMAL HIGH (ref 98–111)
Creatinine, Ser: 1.47 mg/dL — ABNORMAL HIGH (ref 0.61–1.24)
GFR, Estimated: 54 mL/min — ABNORMAL LOW (ref >60)
Glucose, Bld: 169 mg/dL — ABNORMAL HIGH (ref 70–99)
Potassium: 5.5 mmol/L — ABNORMAL HIGH (ref 3.5–5.1)
Sodium: 145 mmol/L (ref 135–145)

## 2021-01-08 LAB — PHOSPHORUS: Phosphorus: 4.3 mg/dL (ref 2.5–4.6)

## 2021-01-08 LAB — MAGNESIUM: Magnesium: 2.4 mg/dL (ref 1.7–2.4)

## 2021-01-08 MED ORDER — SODIUM ZIRCONIUM CYCLOSILICATE 5 G PO PACK
10.0000 g | PACK | Freq: Every day | ORAL | Status: DC
  Filled 2021-01-08: qty 2

## 2021-01-08 MED ORDER — SODIUM ZIRCONIUM CYCLOSILICATE 5 G PO PACK
10.0000 g | PACK | Freq: Every day | ORAL | Status: DC
  Administered 2021-01-08 – 2021-01-19 (×9): 10 g
  Filled 2021-01-08 (×9): qty 2

## 2021-01-08 NOTE — Progress Notes (Signed)
PHARMACY CONSULT NOTE  Pharmacy Consult for Electrolyte Monitoring and Replacement   Recent Labs: Potassium (mmol/L)  Date Value  01/08/2021 5.5 (H)   Magnesium (mg/dL)  Date Value  22/97/9892 2.4   Calcium (mg/dL)  Date Value  11/94/1740 8.9   Albumin (g/dL)  Date Value  81/44/8185 2.4 (L)   Phosphorus (mg/dL)  Date Value  63/14/9702 4.3   Sodium (mmol/L)  Date Value  01/08/2021 145   Corrected calcium:  9.4 mg/dL  Assessment:  60 year old male with obesity, diabetes mellitus, hypertension, and combined diastolic and systolic heart failure who presented to the emergency department yesterday in respiratory distress admitted following witnessed cardiac arrest. Pt is S/P CPR. Remains intubated. Pharmacy consulted for electrolyte monitoring and replacement.  PROSource 69mL TID Vital High Protein @ 17ml/hr Free water 32mL q4h  Goal of Therapy:  Electrolytes WNL  Plan:   No replacement indicated at this time.  Will follow with AM labs and replace as needed.  Ronnald Ramp, PharmD Clinical Pharmacist 01/08/2021 8:54 AM

## 2021-01-08 NOTE — Progress Notes (Addendum)
NAME:  Jeremy Browning, MRN:  [id number], DOB:  Jan 12, 1961, LOS: 68 ADMISSION DATE:  12/25/2020, INITIAL CONSULTATION DATE:  12/25/2020 REFERRING MD: Dr. Jacqualine Code, CHIEF COMPLAINT: Respiratory distress  Brief Patient Description  60 yo M presenting to [facility] ED from home via EMS with respiratory distress that had been progressively worsening over the course of 12/25/20. Per documentation family stated the patient was in his normal state of health until this morning when he began feeling unwell. He then called out stating he could not breathe. EMS found the patient hypoxic and in distress with SpO2 in the 70's on room air. Patient was place on CPAP, arriving to the ED unresponsive and pulseless.  ED course: Upon arrival to ED patient was defibrillated x 1 and received CPR before achieving ROSC. Initial EKG suggestive of global ischemia, second EKG had possible LBBB vs IVCD. Patient was emergently intubated requiring mechanical ventilation. Per ED documentation STEMI team activated in the setting of a witnessed shockable cardiac arrest with clinical exam highly suggestive of pulmonary edema with pink frothy sputum throughout ET tubing.  Pertinent  Medical History  Obesity Hypertension Diabetes mellitus Chronic renal insufficiency Combined systolic and diastolic heart failure  Significant Hospital Events: Including procedures, antibiotic start and stop dates in addition to other pertinent events    5/7: Admit to ICU for Acute combined hypoxic & hypercapnic respiratory and Acute combined systolic & diastolic heart failure.Cardiac catheterization on presentation with normal coronaries, good cardiac output, elevated pulmonary capillary wedge pressure. 5/8remains on Vent 5/9remains on vent , multiorgan failureMRI shows brain injury 5/10remains on vent, multiorgan failure, brother updated 5/11severe resp failure 5/35filed weaning trials due to severe brain damage 5/148fled weaning trials severe  hypoxia dn vent dyssynchrony, sister updated 5/14vent support, severe brain damage, prognosis is poor 5/16unresponsive on wake up assessment, becomes tachypneic when sedation is lightened 5/17no purposeful movement on wake up assessment, becomes tachypneic, asynchronous with vent.EEG performed today reveals continuous generalized slowing admixed with intermittent generalized 2 to 3 Hz delta slowing  5/18 remains critically ill, severe brain damage, patient made DNR 5/20 remains unresponsive, critically ill, pressor dependent, GOC being discussed with family 5/20:Patient developed cuff leak on existing ET tube inability to ventilate with cuff leak.ETT Exchanged  Cultures:  5/7:SARS-CoV-2 PCR>> negative 5/7:Influenza PCR>> negative 5/7: Blood culture x2>>STAPHYLOCOCCUS HOMINIS1/4 cultures MRSA NEG 5/7Urine>>no growth MRSA PCR>> negative   Antimicrobials:   None in the last 72 hours  Interim History / Subjective:  Remains critically ill on the vent Pressor dependent Remains encephalopathic Family proceeding with Trach and PEG on Wednesday  OBJECTIVE   Blood pressure (!) 128/50, pulse 74, temperature 98.5 F (36.9 C), resp. rate 16, height 6' 0.76" (1.848 m), weight 110.4 kg, SpO2 99 %.    Vent Mode: PRVC FiO2 (%):  [28 %] 28 % Set Rate:  [13 bmp-16 bmp] 16 bmp Vt Set:  [550 mL] 550 mL PEEP:  [5 cmH20] 5 cmH20 Plateau Pressure:  [13 cmH20] 13 cmH20   Intake/Output Summary (Last 24 hours) at 01/08/2021 0924 Last data filed at 01/08/2021 0645 Gross per 24 hour  Intake 2231.48 ml  Output 2025 ml  Net 206.48 ml   Filed Weights   01/05/21 0355 01/07/21 0432 01/08/21 0451  Weight: 122.5 kg 113.1 kg 110.4 kg    Examination: GENERAL:[age]ear-old critically ill patient lying in the bed on the ventillator. EYES: Pupils equal, round, reactive to light and accommodation. No scleral icterus. Extraocular muscles intact.  HEENT: Head atraumatic, normocephalic. Oropharynx and  nasopharynx clear.  NECK:  Supple, no jugular venous distention. No thyroid enlargement, no tenderness.  LUNGS: Abnormal  breath sounds bilaterally, no wheezing, rales, Diffuse rhonchi. No use of accessory muscles of respiration.  CARDIOVASCULAR: S1, S2 normal. No murmurs, rubs, or gallops.  ABDOMEN: Soft, nontender, nondistended. Bowel sounds present. No organomegaly or mass.  EXTREMITIES: Generalized dependent edema NEUROLOGIC: Mental Status: Patient does not respond to verbal stimuli. Grimaces to deep sternal rub.  Does not follow commands.  No verbalizations noted.  Cranial Nerves: II: patient blinks to confrontation bilaterally, pupils right 3 mm, left 3 mm,and reactive bilaterally III,IV,VI: doll's response present bilaterally. Upward gaze V,VII: corneal reflex present bilaterally  VIII: patient does not respond to verbal stimuli IX,X: gag reflex present, XI: trapezius strength unable to test bilaterally XII: tongue strength unable to test Motor: Withdraws bilateral lower extremities to noxious stimuli, extends upper L>R Sensory: respond to noxious stimuli in ALL extremity. Deep Tendon Reflexes:  Absent throughout. Plantars: upgoing bilaterally Cerebellar: Unable to perform PSYCHIATRIC: unable to assess.  SKIN: No obvious rash, lesion, or ulcer.   Labs/imaging that I havepersonally reviewed  (right click and "Reselect all SmartList Selections" daily)    Labs   CBC: Recent Labs  Lab 01/04/21 0507 01/05/21 0348 01/06/21 0416 01/07/21 0440 01/08/21 0448  WBC 9.6 10.3 11.7* 10.8* 10.9*  NEUTROABS  --  6.2 7.2 6.1 7.4  HGB 9.9* 10.4* 11.2* 11.3* 10.5*  HCT 32.4* 33.2* 34.6* 37.4* 34.8*  MCV 81.8 79.6* 80.5 81.5 81.7  PLT 439* 466* 569* 560* 490*    Basic Metabolic Panel: Recent Labs  Lab 01/04/21 0507 01/05/21 0348 01/06/21 0559 01/07/21 0440 01/08/21 0448  NA 142 142 143 143 145  K 4.9 4.6 5.0 5.1 5.5*  CL 114* 116* 115* 118* 119*  CO2 20* 19* 20* 21* 20*   GLUCOSE 122* 130* 201* 210* 169*  BUN 48* 48* 40* 36* 46*  CREATININE 1.50* 1.30* 1.35* 1.36* 1.47*  CALCIUM 8.7* 8.8* 9.1 9.2 8.9  MG 2.5* 2.4 2.3 2.2 2.4  PHOS 5.0* 4.4 4.0 3.7 4.3   GFR: Estimated Creatinine Clearance: 69.3 mL/min (A) (by C-G formula based on SCr of 1.47 mg/dL (H)). Recent Labs  Lab 01/04/21 0507 01/05/21 0348 01/06/21 0416 01/06/21 0559 01/07/21 0440 01/08/21 0448  PROCALCITON 0.38 0.24  --  0.19  --   --   WBC 9.6 10.3 11.7*  --  10.8* 10.9*    Liver Function Tests: Recent Labs  Lab 01/04/21 0507  ALBUMIN 2.4*   No results for input(s): LIPASE, AMYLASE in the last 168 hours. Recent Labs  Lab 01/07/21 0912  AMMONIA 22    ABG    Component Value Date/Time   PHART 7.37 12/29/2020 1032   PCO2ART 35 12/29/2020 1032   PO2ART 81 (L) 12/29/2020 1032   HCO3 20.2 12/29/2020 1032   ACIDBASEDEF 4.5 (H) 12/29/2020 1032   O2SAT 95.5 12/29/2020 1032     Coagulation Profile: No results for input(s): INR, PROTIME in the last 168 hours.  Cardiac Enzymes: No results for input(s): CKTOTAL, CKMB, CKMBINDEX, TROPONINI in the last 168 hours.  HbA1C: Hgb A1c MFr Bld  Date/Time Value Ref Range Status  12/25/2020 04:46 PM 5.9 (H) 4.8 - 5.6 % Final    Comment:    (NOTE) Pre diabetes:          5.7%-6.4%  Diabetes:              >6.4%  Glycemic control for   <7.0%  adults with diabetes     CBG: Recent Labs  Lab 01/07/21 1543 01/07/21 1932 01/07/21 2326 01/08/21 0308 01/08/21 0728  GLUCAP 153* 186* 151* 147* 135*    Allergies Not on File   Home Medications  Prior to Admission medications   Medication Sig Start Date End Date Taking? Authorizing Provider  aspirin EC 81 MG tablet Take 81 mg by mouth daily. Swallow whole.   Yes [provider]  atorvastatin (LIPITOR) 80 MG tablet Take 80 mg by mouth daily.   Yes [provider]  carvedilol (COREG) 12.5 MG tablet Take 12.5 mg by mouth 2 (two) times daily with a meal.   Yes  [provider]  furosemide (LASIX) 80 MG tablet Take 80 mg by mouth daily.   Yes [provider]  losartan (COZAAR) 50 MG tablet Take 50 mg by mouth daily.   Yes [provider]    Scheduled Meds: . chlorhexidine gluconate (MEDLINE KIT)  15 mL Mouth Rinse BID  . Chlorhexidine Gluconate Cloth  6 each Topical Q0600  . docusate  100 mg Per Tube BID  . enoxaparin (LOVENOX) injection  0.5 mg/kg Subcutaneous Q24H  . famotidine  20 mg Per Tube Daily  . feeding supplement (PROSource TF)  90 mL Per Tube QID  . free water  30 mL Per Tube Q4H  . insulin aspart  0-15 Units Subcutaneous Q4H  . mouth rinse  15 mL Mouth Rinse 10 times per day  . polyethylene glycol  17 g Per Tube Daily  . sodium zirconium cyclosilicate  10 g Per Tube Daily   Continuous Infusions: . sodium chloride Stopped (01/05/21 0448)  . sodium chloride    . cefTRIAXone (ROCEPHIN)  IV Stopped (01/07/21 1323)  . feeding supplement (VITAL 1.5 CAL) 1,000 mL (01/08/21 0843)  . fentaNYL infusion INTRAVENOUS 50 mcg/hr (01/08/21 0645)  . norepinephrine (LEVOPHED) Adult infusion 2 mcg/min (01/08/21 0645)   PRN Meds:.sodium chloride, acetaminophen (TYLENOL) oral liquid 160 mg/5 mL, albuterol, docusate, fentaNYL (SUBLIMAZE) injection, hydrALAZINE, midazolam, polyethylene glycol, sodium chloride flush, sodium chloride flush   Resolved Hospital Problem list     ASSESSMENT & PLAN   60 yo morbidly obese AAM with acute and severe cardiac arrest NSTEMI with acute severe systolic cardiac failure with signs and symptoms of severe anoxic encephalopathy   Acute Hypoxic / Hypercapnic Respiratory Failure in the setting of Acute combined heart failure & suspected  flush pulmonary edema/VT arrest/AV block  ARDS PMHx: HFrEF -Continue mechanical ventilator support with plans to proceed with [name] on Wednesday -Unable to Wean PEEP & FiO2 due to severe encephalopathy -Head of bed elevated 30 degrees, VAP protocol in  place -Goal Plateau pressures less than 30 cm H20  -Intermittent chest x-ray & ABG PRN -Daily WUA with SBT as tolerated  -Ensure adequate pulmonary hygiene  -Blood cultures grew proteus mirabilis pneumonia, completed Rocephin -bronchodilators PRN -PAD protocol in place: continue Fentanyl drip    Acute systolic heart failure (last known EF 45 to 50%) Nonischemic cardiomyopathy Pulmonary Hypertension VT arrest/AV block: Status post defibrillation x1 and CPR on presentation -Continuous cardiac monitoring -Maintain MAP greater than 65 -IV Lasix as blood pressure and renal function and BP permits -Persistent cardiogenic shock requiring low-dose pressors -Consider midodrine if unable to wean off pressors -Cardiology following, appreciate input   Severe Anoxic Encephalopathy  MRI Brain shows cortical restricted diffusion/multiple infarcts EEG shows moderate diffuse encephalopathy -Provide supportive care    Acute Kidney Injury Hyperkalemia BUN 46/Cr 1.47 up from  1.36 -Monitor I&O's / urinary output -Follow BMP -Ensure adequate renal perfusion -Avoid nephrotoxic agents as able -Replace electrolytes as indicated -Continue Lokelma   Diabetes Mellitus -CBGs -Sliding scale insulin -Follow ICU hyper/hypoglycemia protocol     Best practice (right click and "Reselect all SmartList Selections" daily)  Diet:  Tube Feed  Pain/Anxiety/Delirium protocol (if indicated): Yes (RASS goal -1) VAP protocol (if indicated): Yes DVT prophylaxis: LMWH GI prophylaxis: H2B Glucose control:  SSI Yes Central venous access:  Yes, and it is still needed Arterial line:  Yes, and it is still needed Foley:  Yes, and it is still needed Mobility:  bed rest  PT consulted: N/A Last date of multidisciplinary goals of care discussion [5/20] Code Status:  full code Disposition: ICU  Critical care time: [place], DNP, FNP-C, AGACNP-BC Acute Care Nurse Practitioner  [place]  Pulmonary & Critical [facility] Medicine Pager: [phone] [place] at [facility]

## 2021-01-09 DIAGNOSIS — I469 Cardiac arrest, cause unspecified: Secondary | ICD-10-CM | POA: Diagnosis not present

## 2021-01-09 DIAGNOSIS — J9601 Acute respiratory failure with hypoxia: Secondary | ICD-10-CM | POA: Diagnosis not present

## 2021-01-09 DIAGNOSIS — J810 Acute pulmonary edema: Secondary | ICD-10-CM | POA: Diagnosis not present

## 2021-01-09 LAB — PHOSPHORUS: Phosphorus: 5 mg/dL — ABNORMAL HIGH (ref 2.5–4.6)

## 2021-01-09 LAB — CBC WITH DIFFERENTIAL/PLATELET
Abs Immature Granulocytes: 0.02 10*3/uL (ref 0.00–0.07)
Basophils Absolute: 0.1 10*3/uL (ref 0.0–0.1)
Basophils Relative: 1 %
Eosinophils Absolute: 0.3 10*3/uL (ref 0.0–0.5)
Eosinophils Relative: 4 %
HCT: 32.8 % — ABNORMAL LOW (ref 39.0–52.0)
Hemoglobin: 10.2 g/dL — ABNORMAL LOW (ref 13.0–17.0)
Immature Granulocytes: 0 %
Lymphocytes Relative: 29 %
Lymphs Abs: 2.8 10*3/uL (ref 0.7–4.0)
MCH: 25.2 pg — ABNORMAL LOW (ref 26.0–34.0)
MCHC: 31.1 g/dL (ref 30.0–36.0)
MCV: 81 fL (ref 80.0–100.0)
Monocytes Absolute: 1 10*3/uL (ref 0.1–1.0)
Monocytes Relative: 10 %
Neutro Abs: 5.4 10*3/uL (ref 1.7–7.7)
Neutrophils Relative %: 56 %
Platelets: 464 10*3/uL — ABNORMAL HIGH (ref 150–400)
RBC: 4.05 MIL/uL — ABNORMAL LOW (ref 4.22–5.81)
RDW: 17 % — ABNORMAL HIGH (ref 11.5–15.5)
WBC: 9.6 10*3/uL (ref 4.0–10.5)
nRBC: 0 % (ref 0.0–0.2)

## 2021-01-09 LAB — BASIC METABOLIC PANEL
Anion gap: 7 (ref 5–15)
BUN: 54 mg/dL — ABNORMAL HIGH (ref 6–20)
CO2: 20 mmol/L — ABNORMAL LOW (ref 22–32)
Calcium: 8.8 mg/dL — ABNORMAL LOW (ref 8.9–10.3)
Chloride: 120 mmol/L — ABNORMAL HIGH (ref 98–111)
Creatinine, Ser: 1.79 mg/dL — ABNORMAL HIGH (ref 0.61–1.24)
GFR, Estimated: 43 mL/min — ABNORMAL LOW (ref >60)
Glucose, Bld: 142 mg/dL — ABNORMAL HIGH (ref 70–99)
Potassium: 5.1 mmol/L (ref 3.5–5.1)
Sodium: 147 mmol/L — ABNORMAL HIGH (ref 135–145)

## 2021-01-09 LAB — GLUCOSE, CAPILLARY
Glucose-Capillary: 141 mg/dL — ABNORMAL HIGH (ref 70–99)
Glucose-Capillary: 125 mg/dL — ABNORMAL HIGH (ref 70–99)
Glucose-Capillary: 129 mg/dL — ABNORMAL HIGH (ref 70–99)
Glucose-Capillary: 150 mg/dL — ABNORMAL HIGH (ref 70–99)
Glucose-Capillary: 140 mg/dL — ABNORMAL HIGH (ref 70–99)
Glucose-Capillary: 136 mg/dL — ABNORMAL HIGH (ref 70–99)

## 2021-01-09 LAB — TRIGLYCERIDES: Triglycerides: 82 mg/dL (ref <150)

## 2021-01-09 LAB — MAGNESIUM: Magnesium: 2.5 mg/dL — ABNORMAL HIGH (ref 1.7–2.4)

## 2021-01-09 NOTE — Progress Notes (Signed)
NAME:  Jeremy Browning, MRN:  [id number], DOB:  [birth year], LOS: 2 ADMISSION DATE:  12/25/2020, INITIAL CONSULTATION DATE:  12/25/2020 REFERRING MD: Dr. Jacqualine Code, CHIEF COMPLAINT: Respiratory distress  Brief Patient Description  60 yo M presenting to [facility] ED from home via EMS with respiratory distress that had been progressively worsening over the course of 12/25/20. Per documentation family stated the patient was in his normal state of health until this morning when he began feeling unwell. He then called out stating he could not breathe. EMS found the patient hypoxic and in distress with SpO2 in the 70's on room air. Patient was place on CPAP, arriving to the ED unresponsive and pulseless.  ED course: Upon arrival to ED patient was defibrillated x 1 and received CPR before achieving ROSC. Initial EKG suggestive of global ischemia, second EKG had possible LBBB vs IVCD. Patient was emergently intubated requiring mechanical ventilation. Per ED documentation STEMI team activated in the setting of a witnessed shockable cardiac arrest with clinical exam highly suggestive of pulmonary edema with pink frothy sputum throughout ET tubing.  Pertinent  Medical History  Obesity Hypertension Diabetes mellitus Chronic renal insufficiency Combined systolic and diastolic heart failure  Significant Hospital Events: Including procedures, antibiotic start and stop dates in addition to other pertinent events    5/7: Admit to ICU for Acute combined hypoxic & hypercapnic respiratory and Acute combined systolic & diastolic heart failure.Cardiac catheterization on presentation with normal coronaries, good cardiac output, elevated pulmonary capillary wedge pressure. 5/8remains on Vent 5/9remains on vent , multiorgan failureMRI shows brain injury 5/10remains on vent, multiorgan failure, brother updated 5/11severe resp failure 5/71filed weaning trials due to severe brain damage 5/171fled weaning trials severe  hypoxia dn vent dyssynchrony, sister updated 5/14vent support, severe brain damage, prognosis is poor 5/16unresponsive on wake up assessment, becomes tachypneic when sedation is lightened 5/17no purposeful movement on wake up assessment, becomes tachypneic, asynchronous with vent.EEG performed today reveals continuous generalized slowing admixed with intermittent generalized 2 to 3 Hz delta slowing  5/18 remains critically ill, severe brain damage, patient made DNR 5/20 remains unresponsive, critically ill, pressor dependent, GOC being discussed with family. Patient developed cuff leak on existing ET tube inability to ventilate with cuff leak.ETT Exchanged 5/21: Remains on the vent, plan for Trach and PEG Wednesday  Cultures:  5/7:SARS-CoV-2 PCR>> negative 5/7:Influenza PCR>> negative 5/7: Blood culture x2>>STAPHYLOCOCCUS HOMINIS1/4 cultures MRSA NEG 5/7Urine>>no growth MRSA PCR>> negative   Antimicrobials:   None in the last 72 hours  Interim History / Subjective:  Remains critically ill on the vent BP stable not requiring pressors Remains encephalopathic Family proceeding with Trach and PEG on Wednesday  OBJECTIVE   Blood pressure (!) 146/65, pulse 78, temperature 98.6 F (37 C), temperature source Esophageal, resp. rate 16, height 6' 0.76" (1.848 m), weight 117.9 kg, SpO2 99 %.    Vent Mode: PRVC FiO2 (%):  [28 %] 28 % Set Rate:  [16 bmp] 16 bmp Vt Set:  [550 mL] 550 mL PEEP:  [5 cmH20] 5 cmH20 Plateau Pressure:  [14 cmH20-15 cmH20] 14 cmH20   Intake/Output Summary (Last 24 hours) at 01/09/2021 0939 Last data filed at 01/09/2021 0600 Gross per 24 hour  Intake 1246.07 ml  Output 1650 ml  Net -403.93 ml   Filed Weights   01/07/21 0432 01/08/21 0451 01/09/21 0500  Weight: 113.1 kg 110.4 kg 117.9 kg    Examination: GENERAL:[age]ear-old critically ill patient lying in the bed on the ventillator. EYES: Pupils equal, round, reactive  to light and accommodation. No  scleral icterus. Extraocular muscles intact.  HEENT: Head atraumatic, normocephalic. Oropharynx and nasopharynx clear.  NECK:  Supple, no jugular venous distention. No thyroid enlargement, no tenderness.  LUNGS: Abnormal  breath sounds bilaterally, no wheezing, rales, Diffuse rhonchi. No use of accessory muscles of respiration.  CARDIOVASCULAR: S1, S2 normal. No murmurs, rubs, or gallops.  ABDOMEN: Soft, nontender, nondistended. Bowel sounds present. No organomegaly or mass.  EXTREMITIES: Generalized dependent edema NEUROLOGIC: Mental Status: Patient does not respond to verbal stimuli. Grimaces to deep sternal rub.  Does not follow commands.  No verbalizations noted.  Cranial Nerves: II: patient blinks to confrontation bilaterally, pupils right 3 mm, left 3 mm,and reactive bilaterally III,IV,VI: doll's response present bilaterally. Upward gaze V,VII: corneal reflex present bilaterally  VIII: patient does not respond to verbal stimuli IX,X: gag reflex present, XI: trapezius strength unable to test bilaterally XII: tongue strength unable to test Motor: Withdraws bilateral lower extremities to noxious stimuli, extends upper L>R Sensory: respond to noxious stimuli in ALL extremity. Deep Tendon Reflexes:  Absent throughout. Plantars: upgoing bilaterally Cerebellar: Unable to perform PSYCHIATRIC: unable to assess.  SKIN: No obvious rash, lesion, or ulcer.   Labs/imaging that I havepersonally reviewed  (right click and "Reselect all SmartList Selections" daily)    Labs   CBC: Recent Labs  Lab 01/05/21 0348 01/06/21 0416 01/07/21 0440 01/08/21 0448 01/09/21 0509  WBC 10.3 11.7* 10.8* 10.9* 9.6  NEUTROABS 6.2 7.2 6.1 7.4 5.4  HGB 10.4* 11.2* 11.3* 10.5* 10.2*  HCT 33.2* 34.6* 37.4* 34.8* 32.8*  MCV 79.6* 80.5 81.5 81.7 81.0  PLT 466* 569* 560* 490* 464*    Basic Metabolic Panel: Recent Labs  Lab 01/05/21 0348 01/06/21 0559 01/07/21 0440 01/08/21 0448 01/08/21 1320  01/09/21 0509  NA 142 143 143 145  --  147*  K 4.6 5.0 5.1 5.5* 5.0 5.1  CL 116* 115* 118* 119*  --  120*  CO2 19* 20* 21* 20*  --  20*  GLUCOSE 130* 201* 210* 169*  --  142*  BUN 48* 40* 36* 46*  --  54*  CREATININE 1.30* 1.35* 1.36* 1.47*  --  1.79*  CALCIUM 8.8* 9.1 9.2 8.9  --  8.8*  MG 2.4 2.3 2.2 2.4  --  2.5*  PHOS 4.4 4.0 3.7 4.3  --  5.0*   GFR: Estimated Creatinine Clearance: 58.8 mL/min (A) (by C-G formula based on SCr of 1.79 mg/dL (H)). Recent Labs  Lab 01/04/21 0507 01/05/21 0348 01/06/21 0416 01/06/21 0559 01/07/21 0440 01/08/21 0448 01/09/21 0509  PROCALCITON 0.38 0.24  --  0.19  --   --   --   WBC 9.6 10.3 11.7*  --  10.8* 10.9* 9.6    Liver Function Tests: Recent Labs  Lab 01/04/21 0507  ALBUMIN 2.4*   No results for input(s): LIPASE, AMYLASE in the last 168 hours. Recent Labs  Lab 01/07/21 0912  AMMONIA 22    ABG    Component Value Date/Time   PHART 7.37 12/29/2020 1032   PCO2ART 35 12/29/2020 1032   PO2ART 81 (L) 12/29/2020 1032   HCO3 20.2 12/29/2020 1032   ACIDBASEDEF 4.5 (H) 12/29/2020 1032   O2SAT 95.5 12/29/2020 1032     Coagulation Profile: No results for input(s): INR, PROTIME in the last 168 hours.  Cardiac Enzymes: No results for input(s): CKTOTAL, CKMB, CKMBINDEX, TROPONINI in the last 168 hours.  HbA1C: Hgb A1c MFr Bld  Date/Time Value Ref Range Status  12/25/2020  04:46 PM 5.9 (H) 4.8 - 5.6 % Final    Comment:    (NOTE) Pre diabetes:          5.7%-6.4%  Diabetes:              >6.4%  Glycemic control for   <7.0% adults with diabetes     CBG: Recent Labs  Lab 01/08/21 1603 01/08/21 1958 01/08/21 2338 01/09/21 0333 01/09/21 0745  GLUCAP 138* 147* 127* 141* 125*    Allergies Not on File   Home Medications  Prior to Admission medications   Medication Sig Start Date End Date Taking? Authorizing Provider  aspirin EC 81 MG tablet Take 81 mg by mouth daily. Swallow whole.   Yes [provider]   atorvastatin (LIPITOR) 80 MG tablet Take 80 mg by mouth daily.   Yes [provider]  carvedilol (COREG) 12.5 MG tablet Take 12.5 mg by mouth 2 (two) times daily with a meal.   Yes [provider]  furosemide (LASIX) 80 MG tablet Take 80 mg by mouth daily.   Yes [provider]  losartan (COZAAR) 50 MG tablet Take 50 mg by mouth daily.   Yes [provider]    Scheduled Meds: . chlorhexidine gluconate (MEDLINE KIT)  15 mL Mouth Rinse BID  . Chlorhexidine Gluconate Cloth  6 each Topical Q0600  . docusate  100 mg Per Tube BID  . enoxaparin (LOVENOX) injection  0.5 mg/kg Subcutaneous Q24H  . famotidine  20 mg Per Tube Daily  . feeding supplement (PROSource TF)  90 mL Per Tube QID  . free water  30 mL Per Tube Q4H  . insulin aspart  0-15 Units Subcutaneous Q4H  . mouth rinse  15 mL Mouth Rinse 10 times per day  . polyethylene glycol  17 g Per Tube Daily  . sodium zirconium cyclosilicate  10 g Per Tube Daily   Continuous Infusions: . sodium chloride Stopped (01/05/21 0448)  . sodium chloride    . cefTRIAXone (ROCEPHIN)  IV Stopped (01/08/21 1350)  . feeding supplement (VITAL 1.5 CAL) 1,000 mL (01/09/21 0833)  . fentaNYL infusion INTRAVENOUS 50 mcg/hr (01/09/21 0200)  . norepinephrine (LEVOPHED) Adult infusion Stopped (01/08/21 0838)   PRN Meds:.sodium chloride, acetaminophen (TYLENOL) oral liquid 160 mg/5 mL, albuterol, docusate, fentaNYL (SUBLIMAZE) injection, hydrALAZINE, midazolam, polyethylene glycol, sodium chloride flush, sodium chloride flush   Resolved Hospital Problem list     ASSESSMENT & PLAN   60 yo morbidly obese AAM with acute and severe cardiac arrest NSTEMI with acute severe systolic cardiac failure with signs and symptoms of severe anoxic encephalopathy   Acute Hypoxic / Hypercapnic Respiratory Failure in the setting of Acute combined heart failure & suspected  flush pulmonary edema/VT arrest/AV block  ARDS PMHx:  HFrEF -Continue mechanical ventilator support with plans to proceed with [name] on Wednesday -Unable to Wean PEEP & FiO2 due to severe encephalopathy -Head of bed elevated 30 degrees, VAP protocol in place -Goal Plateau pressures less than 30 cm H20  -Intermittent chest x-ray & ABG PRN -Daily WUA with SBT as tolerated  -Ensure adequate pulmonary hygiene  -Blood cultures grew proteus mirabilis pneumonia, completed Rocephin -bronchodilators PRN -PAD protocol in place: continue Fentanyl drip    Acute systolic heart failure (last known EF 45 to 50%) Nonischemic cardiomyopathy Pulmonary Hypertension Cardiogenic shock VT arrest/AV block: Status post defibrillation x1 and CPR on presentation -Continuous cardiac monitoring -Maintain MAP greater than 65. Not requiring pressor -IV Lasix as blood pressure and  renal function and BP permits -Cardiology following, appreciate input   Severe Anoxic Encephalopathy  MRI Brain shows cortical restricted diffusion/multiple infarcts EEG shows moderate diffuse encephalopathy -Provide supportive care    Acute Kidney Injury Hyperkalemia BUN 46/Cr 1.47 up from 1.36 -Monitor I&O's / urinary output -Follow BMP -Ensure adequate renal perfusion -Avoid nephrotoxic agents as able -Replace electrolytes as indicated -Continue Lokelma   Diabetes Mellitus -CBGs -Sliding scale insulin -Follow ICU hyper/hypoglycemia protocol     Best practice (right click and "Reselect all SmartList Selections" daily)  Diet:  Tube Feed  Pain/Anxiety/Delirium protocol (if indicated): Yes (RASS goal -1) VAP protocol (if indicated): Yes DVT prophylaxis: LMWH GI prophylaxis: H2B Glucose control:  SSI Yes Central venous access:  Yes, and it is still needed Arterial line:  Yes, and it is still needed Foley:  Yes, and it is still needed Mobility:  bed rest  PT consulted: N/A Last date of multidisciplinary goals of care discussion [5/20] Code Status:  full  code Disposition: ICU  Critical care time: [place], DNP, FNP-C, AGACNP-BC Acute Care Nurse Practitioner  [place] [facility] Pager: [phone] [place] at [facility]

## 2021-01-09 NOTE — Progress Notes (Signed)
PHARMACY CONSULT NOTE  Pharmacy Consult for Electrolyte Monitoring and Replacement   Recent Labs: Potassium (mmol/L)  Date Value  01/09/2021 5.1   Magnesium (mg/dL)  Date Value  75/64/3329 2.5 (H)   Calcium (mg/dL)  Date Value  51/88/4166 8.8 (L)   Albumin (g/dL)  Date Value  02/18/1600 2.4 (L)   Phosphorus (mg/dL)  Date Value  09/32/3557 5.0 (H)   Sodium (mmol/L)  Date Value  01/09/2021 147 (H)   Corrected calcium:  9.4 mg/dL  Assessment:  60 year old male with obesity, diabetes mellitus, hypertension, and combined diastolic and systolic heart failure who presented to the emergency department yesterday in respiratory distress admitted following witnessed cardiac arrest. Pt is S/P CPR. Remains intubated. Pharmacy consulted for electrolyte monitoring and replacement.  PROSource 8mL TID Vital High Protein @ 21ml/hr Free water 22mL q4h  Goal of Therapy:  Electrolytes WNL  Plan:   No replacement indicated at this time.  On lokelma 10 mg daily.   Will follow with AM labs and replace as needed.  Ronnald Ramp, PharmD Clinical Pharmacist 01/09/2021 8:06 AM

## 2021-01-09 NOTE — Progress Notes (Signed)
Patient remains on vent. No changes to vent settings during shift. Has tolerated interventions well.  Green bite block remains in patient's mouth but not tied around head. Device has done well for shielding against patient biting on tube. Has been recent issue resulting in changing of ET tube due to leak. Able to suction mouth well around bite block. Large amounts of secretions obtained from mouth with suctioning.

## 2021-01-09 NOTE — TOC Progression Note (Signed)
Transition of Care [facility]) - Progression Note    Patient Details  Name: Jeremy Browning MRN: [id number] Date of Birth: 1961/02/09  Transition of Care [facility]) CM/SW Contact  Joseph Art, [place] Phone Number: 01/09/2021, 9:40 AM  Clinical Narrative:     Patient is sedated/vent, remains in critical condition. Pt remains unresponsive, critically ill, pressor dependent, GOC discussed with family, patient is DNR. TOC will continue to follow for disposition.  Expected Discharge Plan: Skilled Nursing Facility Barriers to Discharge: Continued Medical Work up  Expected Discharge Plan and Services Expected Discharge Plan: Skilled Nursing Facility In-house Referral: Clinical Social Work   Post Acute Care Choice: Skilled Nursing Facility                                         Social Determinants of Health (SDOH) Interventions    Readmission Risk Interventions No flowsheet data found.

## 2021-01-10 ENCOUNTER — Encounter: Payer: Self-pay | Admitting: Pulmonary Disease

## 2021-01-10 DIAGNOSIS — N170 Acute kidney failure with tubular necrosis: Secondary | ICD-10-CM | POA: Diagnosis not present

## 2021-01-10 DIAGNOSIS — R570 Cardiogenic shock: Secondary | ICD-10-CM | POA: Diagnosis not present

## 2021-01-10 DIAGNOSIS — I469 Cardiac arrest, cause unspecified: Secondary | ICD-10-CM | POA: Diagnosis not present

## 2021-01-10 LAB — BASIC METABOLIC PANEL
Anion gap: 6 (ref 5–15)
BUN: 60 mg/dL — ABNORMAL HIGH (ref 6–20)
CO2: 21 mmol/L — ABNORMAL LOW (ref 22–32)
Calcium: 9 mg/dL (ref 8.9–10.3)
Chloride: 124 mmol/L — ABNORMAL HIGH (ref 98–111)
Creatinine, Ser: 1.53 mg/dL — ABNORMAL HIGH (ref 0.61–1.24)
GFR, Estimated: 52 mL/min — ABNORMAL LOW (ref >60)
Glucose, Bld: 180 mg/dL — ABNORMAL HIGH (ref 70–99)
Potassium: 5 mmol/L (ref 3.5–5.1)
Sodium: 151 mmol/L — ABNORMAL HIGH (ref 135–145)

## 2021-01-10 LAB — CBC
HCT: 33.4 % — ABNORMAL LOW (ref 39.0–52.0)
Hemoglobin: 9.9 g/dL — ABNORMAL LOW (ref 13.0–17.0)
MCH: 24.6 pg — ABNORMAL LOW (ref 26.0–34.0)
MCHC: 29.6 g/dL — ABNORMAL LOW (ref 30.0–36.0)
MCV: 83.1 fL (ref 80.0–100.0)
Platelets: 437 10*3/uL — ABNORMAL HIGH (ref 150–400)
RBC: 4.02 MIL/uL — ABNORMAL LOW (ref 4.22–5.81)
RDW: 17.2 % — ABNORMAL HIGH (ref 11.5–15.5)
WBC: 9.3 10*3/uL (ref 4.0–10.5)
nRBC: 0 % (ref 0.0–0.2)

## 2021-01-10 LAB — GLUCOSE, CAPILLARY
Glucose-Capillary: 137 mg/dL — ABNORMAL HIGH (ref 70–99)
Glucose-Capillary: 139 mg/dL — ABNORMAL HIGH (ref 70–99)
Glucose-Capillary: 130 mg/dL — ABNORMAL HIGH (ref 70–99)
Glucose-Capillary: 155 mg/dL — ABNORMAL HIGH (ref 70–99)
Glucose-Capillary: 124 mg/dL — ABNORMAL HIGH (ref 70–99)
Glucose-Capillary: 122 mg/dL — ABNORMAL HIGH (ref 70–99)

## 2021-01-10 LAB — PHOSPHORUS: Phosphorus: 4.7 mg/dL — ABNORMAL HIGH (ref 2.5–4.6)

## 2021-01-10 LAB — MAGNESIUM: Magnesium: 2.6 mg/dL — ABNORMAL HIGH (ref 1.7–2.4)

## 2021-01-10 MED ORDER — PROSOURCE TF PO LIQD
90.0000 mL | Freq: Three times a day (TID) | ORAL | Status: DC
  Administered 2021-01-10 – 2021-01-11 (×3): 90 mL
  Filled 2021-01-10 (×7): qty 90

## 2021-01-10 MED ORDER — FREE WATER
150.0000 mL | Status: DC
  Administered 2021-01-10 – 2021-01-11 (×5): 150 mL

## 2021-01-10 MED ORDER — FREE WATER
100.0000 mL | Status: DC
  Administered 2021-01-10 (×2): 100 mL

## 2021-01-10 MED ORDER — VITAL 1.5 CAL PO LIQD
1000.0000 mL | ORAL | Status: DC
  Administered 2021-01-10 – 2021-01-11 (×2): 1000 mL

## 2021-01-10 NOTE — TOC Progression Note (Addendum)
Transition of Care [facility]) - Progression Note    Patient Details  Name: Jeremy Browning MRN: [id number] Date of Birth: 07-11-1961  Transition of Care [facility]) CM/SW Contact  Marina Goodell Phone Number: [phone] 01/10/2021, 3:37 PM  Clinical Narrative:     CSW spoke with patient's sister-in-law Branko Steeves [phone] to discuss patient discharge plan.  Ms. Commisso stated the patient's sister Dannell Gortney [phone], will be the main contact.  Ms. Bushnell stated the family spoke with Attending last week and discussed different options for patient placement in discharge plan.  Ms. Rutigliano stated the family agreed on [facility] placement once the patient received the PEG and trach, which he will receive this week.  CSW explained what an LTACH was, and some of the criteria which LTACHs take into consideration when accepting a patient. CSW also mentioned the only LTACHs in the area which are taking Medicaid patient's are [facility] and [place].  Ms. Feild stated their preferred placement was in [place], since the patient's sister lived in [place]. CSW explained that the patient would need to meet criteria and insurance authorization for placement, and that preferred placement could not be guaranteed.  Ms. Cervenka verbalized understanding. CSW also spoke about Long Term Care placement for the patient and explained if the patient did not receive LTACH bed offer, this would be the next option, unless the family was willing to take the patient home. CSW explained the Long Term Care placement process and estimated time line for placement.  CSW reiterated preferred placement cannot guaranteed and Long Term Care placement is dependent on bed availability and ability to meet the patient's needs.  Ms. Dahlem verbalized understanding.  CSW stated I would contact [facility] and request they follow the patient for possible placement.  CSW stated I would update Ms. Kathyrn Lass on Thursday once patient  was 24 hours past trach procedure.   CSW contacted Jenn at [facility] for possible placement.  Expected Discharge Plan: Skilled Nursing Facility Barriers to Discharge: Continued Medical Work up  Expected Discharge Plan and Services Expected Discharge Plan: Skilled Nursing Facility In-house Referral: Clinical Social Work   Post Acute Care Choice: Skilled Nursing Facility                                         Social Determinants of Health (SDOH) Interventions    Readmission Risk Interventions No flowsheet data found.

## 2021-01-10 NOTE — Progress Notes (Signed)
Attempted to call brother for PEG placement consent. No answer at this time.

## 2021-01-10 NOTE — Progress Notes (Signed)
Nutrition Follow Up Note   DOCUMENTATION CODES:   Obesity unspecified  INTERVENTION:   Change to Vital 1.5'@55ml' /hr + Pro-Source TF 48m TID via tube  Free water flushes 1554mq4 hours to maintain tube patency   Regimen provides 2220kcal/day, 155g/day protein and 190876may free water   Liquid MVI daily via tube   NUTRITION DIAGNOSIS:   Inadequate oral intake related to inability to eat (pt sedated and ventilated) as evidenced by NPO status.  GOAL:   Provide needs based on ASPEN/SCCM guidelines  -met with tube feeds   MONITOR:   Vent status,Labs,Weight trends,TF tolerance,Skin,I & O's  ASSESSMENT:   59 48o. black male with diabetes mellitus type II, hypertension and congestive heart failure who [facility] for cardiogenic shock and flash pulmonary edema  Pt remains sedated and ventilated. OGT in place; pt tolerating tube feeds well at goal rate. Will adjust tube feeds as plan is for trach/G-tube on Wednesday. Per chart, pt down ~12lbs since admit.   Medications reviewed and include: colace, lovenox, pepcid, insulin, miralax, lokelma, ceftriaxone, fentanyl  Labs reviewed: Na 151(H), K 5.0 wnl, BUN 60(H), creat 1.53(H), P 4.7(H), Mg 2.6(H) Hgb 9.9(L), Hct 33.4(L) cbgs- 137, 139, 130 x 24 hrs  Patient is currently intubated on ventilator support MV: 9.1 L/min Temp (24hrs), Avg:98.9 F (37.2 C), Min:96.62 F (35.9 C), Max:99.68 F (37.6 C)  Propofol: none   MAP- >105m72m  UOP- 2145ml73miet Order:   Diet Order            Diet NPO time specified  Diet effective now                EDUCATION NEEDS:   No education needs have been identified at this time  Skin:  Skin Assessment: Reviewed RN Assessment  Last BM:  5/22- type 6  Height:   Ht Readings from Last 1 Encounters:  01/07/21 6' 0.76" (1.848 m)    Weight:   Wt Readings from Last 1 Encounters:  01/10/21 114.6 kg    BMI:  Body mass index is 33.56 kg/m.  Estimated  Nutritional Needs:   Kcal:  2267kcal/day  Protein:  >160g/day  Fluid:  2.4-2.7L/day  CaseyKoleen DistanceRD, LDN Please refer to AMIONMary [facility] Hospital & Rehabilitation CenterRD and/or RD on-call/weekend/after hours pager

## 2021-01-10 NOTE — Progress Notes (Signed)
NAME:  Jeremy Browning, MRN:  [id number], DOB:  01-14-1961, LOS: 16 ADMISSION DATE:  12/25/2020 60 yo M presenting to [facility] ED from home via EMS with respiratory distress that had been progressively worsening over the course of 12/25/20. Per documentation family stated the patient was in his normal state of health until this morning when he began feeling unwell. He then called out stating he could not breathe. EMS found the patient hypoxic and in distress with SpO2 in the 70's on room air. Patient was place on CPAP, arriving to the ED unresponsive and pulseless. ED course: Upon arrival to ED patient was defibrillated x 1 and received CPR before achieving ROSC. Initial EKG suggestive of global ischemia, second EKG had possible LBBB vs IVCD. Patient was emergently intubated requiring mechanical ventilation. Per ED documentation STEMI team activated in the setting of a witnessed shockable cardiac arrest with clinical exam highly suggestive of pulmonary edema with pink frothy sputum throughout ET tubing.  Pertinent Medical History  Obesity Hypertension Diabetes mellitus Chronic renal insufficiency Combined systolic and diastolic heart failure  Significant Hospital Events: Including procedures, antibiotic start and stop dates in addition to other pertinent events   Cardiac catheterization on presentation with normal coronaries, good cardiac output, elevated pulmonary capillary wedge pressure.  5/7 severe resp failure 5/8remains on Vent 5/9remains on vent , multiorgan failureMRI shows brain injury 5/10remains on vent, multiorgan failure, brother updated 5/11severe resp failure 5/65failed weaning trials due to severe brain damage 5/41failed weaning trials severe hypoxia dn vent dyssynchrony, sister updated 5/14vent support, severe brain damage, prognosis is poor 5/16unresponsive on wake up assessment, becomes tachypneic when sedation is lightened 5/17no purposeful movement on wake up  assessment, becomes tachypneic, asynchronous with vent 5/18 remains critically ill, severe brain damage, patient made DNR 5/20 family decided on TRACH/PEG tube 5/23 severe brain damage, no further imaging needed   MICRO DATA 5/7STAPHYLOCOCCUS HOMINIS1/4 cultures MRSA NEG 5/8 sputum cx: "normal flora"  COVID And INF A/B NEG    Interim History / Subjective:  Remains critically ill Remains intubated Severe brain damage DNR status +multiorgan failure Plan for trach and PEG tube       Objective   Blood pressure (!) 149/46, pulse 73, temperature 98.5 F (36.9 C), temperature source Oral, resp. rate 16, height 6' 0.76" (1.848 m), weight 114.6 kg, SpO2 100 %.    Vent Mode: PRVC FiO2 (%):  [28 %] 28 % Set Rate:  [16 bmp] 16 bmp Vt Set:  [550 mL] 550 mL PEEP:  [5 cmH20] 5 cmH20 Plateau Pressure:  [14 cmH20-17 cmH20] 17 cmH20   Intake/Output Summary (Last 24 hours) at 01/10/2021 0742 Last data filed at 01/10/2021 0700 Gross per 24 hour  Intake 1414.05 ml  Output 2395 ml  Net -980.95 ml   Filed Weights   01/08/21 0451 01/09/21 0500 01/10/21 0311  Weight: 110.4 kg 117.9 kg 114.6 kg     REVIEW OF SYSTEMS  PATIENT IS UNABLE TO PROVIDE COMPLETE REVIEW OF SYSTEMS DUE TO SEVERE CRITICAL ILLNESS AND TOXIC METABOLIC ENCEPHALOPATHY   PHYSICAL EXAMINATION:  GENERAL:critically ill appearing, +resp distress HEAD: Normocephalic, atraumatic.  EYES: Pupils equal, round, reactive to light.  No scleral icterus.  MOUTH: Moist mucosal membrane. NECK: Supple. No thyromegaly. No nodules. No JVD.  PULMONARY: +rhonchi, +wheezing CARDIOVASCULAR: S1 and S2. Regular rate and rhythm. No murmurs, rubs, or gallops.  GASTROINTESTINAL: Soft, nontender, -distended. Positive bowel sounds.  MUSCULOSKELETAL: +edema.  NEUROLOGIC: obtunded SKIN:intact,warm,dry     Labs/imaging that I havepersonally reviewed  (  right click and "Reselect all SmartList Selections" daily)        ASSESSMENT AND PLAN SYNOPSIS   60 yo morbidly obese AAM with acute and severe cardiac arrest NSTEMI with acute severe systolic cardiac failure with signs and symptoms of severe anoxic encephalopathy.  PATIENT IS NOT IMPROVING, PATIENT WITH MULTIORGAN FAILURE AND VERY POOR CHANCE OF MEANINGFUL RECOVERY  FAMILY HAS DECIDED ON AGGRESSIVE MEDICAL CARE WITH TRACH AND PEG TUBES  Severe ACUTE Hypoxic and Hypercapnic Respiratory Failure -continue Mechanical Ventilator support -continue Bronchodilator Therapy -Wean Fio2 and PEEP as tolerated -VAP/VENT bundle implementation Will need artifical support to live Trach pending    CARDIAC FAILURE-acute  Systolic dysfunction -oxygen as needed   CARDIAC ICU monitoring  ACUTE KIDNEY INJURY/Renal Failure -continue Foley Catheter-assess need -Avoid nephrotoxic agents -Follow urine output, BMP -Ensure adequate renal perfusion, optimize oxygenation -Renal dose medications   NEUROLOGY ACUTE TOXIC METABOLIC ENCEPHALOPATHY -need for sedation -Goal RASS -2 to -3 Severe brain damage Needs artifical support to stay alive  ENDO - ICU hypoglycemic\Hyperglycemia protocol -check FSBS per protocol   GI GI PROPHYLAXIS as indicated  NUTRITIONAL STATUS DIET-->TF's as tolerated Constipation protocol as indicated  ELECTROLYTES -follow labs as needed -replace as needed -pharmacy consultation and following       Labs   CBC: Recent Labs  Lab 01/05/21 0348 01/06/21 0416 01/07/21 0440 01/08/21 0448 01/09/21 0509 01/10/21 0433  WBC 10.3 11.7* 10.8* 10.9* 9.6 9.3  NEUTROABS 6.2 7.2 6.1 7.4 5.4  --   HGB 10.4* 11.2* 11.3* 10.5* 10.2* 9.9*  HCT 33.2* 34.6* 37.4* 34.8* 32.8* 33.4*  MCV 79.6* 80.5 81.5 81.7 81.0 83.1  PLT 466* 569* 560* 490* 464* 437*    Basic Metabolic Panel: Recent Labs  Lab 01/06/21 0559 01/07/21 0440 01/08/21 0448 01/08/21 1320 01/09/21 0509 01/10/21 0433  NA 143 143 145  --  147* 151*  K  5.0 5.1 5.5* 5.0 5.1 5.0  CL 115* 118* 119*  --  120* 124*  CO2 20* 21* 20*  --  20* 21*  GLUCOSE 201* 210* 169*  --  142* 180*  BUN 40* 36* 46*  --  54* 60*  CREATININE 1.35* 1.36* 1.47*  --  1.79* 1.53*  CALCIUM 9.1 9.2 8.9  --  8.8* 9.0  MG 2.3 2.2 2.4  --  2.5* 2.6*  PHOS 4.0 3.7 4.3  --  5.0* 4.7*   GFR: Estimated Creatinine Clearance: 67.8 mL/min (A) (by C-G formula based on SCr of 1.53 mg/dL (H)). Recent Labs  Lab 01/04/21 0507 01/05/21 0348 01/06/21 0416 01/06/21 0559 01/07/21 0440 01/08/21 0448 01/09/21 0509 01/10/21 0433  PROCALCITON 0.38 0.24  --  0.19  --   --   --   --   WBC 9.6 10.3   < >  --  10.8* 10.9* 9.6 9.3   < > = values in this interval not displayed.    Liver Function Tests: Recent Labs  Lab 01/04/21 0507  ALBUMIN 2.4*   No results for input(s): LIPASE, AMYLASE in the last 168 hours. Recent Labs  Lab 01/07/21 0912  AMMONIA 22    ABG    Component Value Date/Time   PHART 7.37 12/29/2020 1032   PCO2ART 35 12/29/2020 1032   PO2ART 81 (L) 12/29/2020 1032   HCO3 20.2 12/29/2020 1032   ACIDBASEDEF 4.5 (H) 12/29/2020 1032   O2SAT 95.5 12/29/2020 1032     Coagulation Profile: No results for input(s): INR, PROTIME in the last 168 hours.  Cardiac Enzymes:  No results for input(s): CKTOTAL, CKMB, CKMBINDEX, TROPONINI in the last 168 hours.  HbA1C: Hgb A1c MFr Bld  Date/Time Value Ref Range Status  12/25/2020 04:46 PM 5.9 (H) 4.8 - 5.6 % Final    Comment:    (NOTE) Pre diabetes:          5.7%-6.4%  Diabetes:              >6.4%  Glycemic control for   <7.0% adults with diabetes     CBG: Recent Labs  Lab 01/09/21 1129 01/09/21 1550 01/09/21 2009 01/09/21 2325 01/10/21 0307  GLUCAP 129* 150* 140* 136* 137*    Allergies Not on File     DVT/GI PRX  assessed I Assessed the need for Labs I Assessed the need for Foley I Assessed the need for Central Venous Line Family Discussion when available I Assessed the need for  Mobilization I made an Assessment of medications to be adjusted accordingly Safety Risk assessment completed  CASE DISCUSSED IN MULTIDISCIPLINARY ROUNDS WITH ICU TEAM     Critical Care Time devoted to patient care services described in this note is 55 minutes.  Critical care was necessary to treat /prevent imminent and life-threatening deterioration. Overall, patient is critically ill, prognosis is guarded.  Patient with Multiorgan failure and at high risk for cardiac arrest and death.    Lucie Leather, M.D.  [facility]  Medical Director [facility] Medical Director [facility] Cardio-Pulmonary Department

## 2021-01-10 NOTE — Consult Note (Signed)
Midge Minium, MD [facility]  [address] [place], [place] [address] Phone: [phone] Fax : [phone]  Consultation  Referring Provider:     Dr. Belia Heman Primary Care Physician:  No primary care provider on file. Primary Gastroenterologist:  Gentry Fitz         Reason for Consultation:     PEG tube placement  Date of Admission:  12/25/2020 Date of Consultation:  01/10/2021         HPI:   Jeremy Browning is a 60 y.o. male Who is admitted to the hospital back on May 7 for respiratory distress and found to be hypoxic.  The patient arrived to the emergency department unresponsive and pulseless.  The patient had CPR started and the EKG was consistent with cardiogenic shock.  The patient had an MRI that showed brain injury with a diagnosis of severe brain damage.  The patient has failed weaning trials and the family was spoken to by the ICU team and they had agreed to a tracheostomy and PEG tube placement I'm now being asked to see the patient for PEG tube placement.  The patient is presently ventilated and not responsive.  Past Medical History:  Diagnosis Date  . Diabetes mellitus type II, non insulin dependent (HCC)   . Essential hypertension Irving Burton like  . Heart failure (HCC)    Unknown details.  By report no cardiac surgery  . Venous stasis dermatitis of left lower extremity     Past Surgical History:  Procedure Laterality Date  . CARDIAC CATHETERIZATION    . LEFT HEART CATH AND CORONARY ANGIOGRAPHY N/A 12/25/2020   Procedure: LEFT HEART CATH AND CORONARY ANGIOGRAPHY;  Surgeon: Marykay Lex, MD;  Location: [facility] INVASIVE CV LAB;  Service: Cardiovascular;  Laterality: N/A;  . RIGHT HEART CATH N/A 12/25/2020   Procedure: RIGHT HEART CATH;  Surgeon: Marykay Lex, MD;  Location: [facility] INVASIVE CV LAB;  Service: Cardiovascular;  Laterality: N/A;  . Unknown      Prior to Admission medications   Medication Sig Start Date End Date Taking? Authorizing Provider  aspirin EC 81 MG  tablet Take 81 mg by mouth daily. Swallow whole.   Yes [provider]  atorvastatin (LIPITOR) 80 MG tablet Take 80 mg by mouth daily.   Yes [provider]  carvedilol (COREG) 12.5 MG tablet Take 12.5 mg by mouth 2 (two) times daily with a meal.   Yes [provider]  furosemide (LASIX) 80 MG tablet Take 80 mg by mouth daily.   Yes [provider]  losartan (COZAAR) 50 MG tablet Take 50 mg by mouth daily.   Yes [provider]    Family History  Family history unknown: Yes        Allergies as of 12/25/2020  . (Not on File)    Review of Systems:    All systems reviewed and negative except where noted in HPI.   Physical Exam:  Vital signs in last 24 hours: Temp:  [96.62 F (35.9 C)-99.68 F (37.6 C)] 98 F (36.7 C) (05/23 1600) Pulse Rate:  [64-83] 75 (05/23 1700) Resp:  [14-19] 14 (05/23 1700) BP: (121-166)/(42-68) 165/61 (05/23 1700) SpO2:  [97 %-100 %] 100 % (05/23 1700) FiO2 (%):  [28 %] 28 % (05/23 1600) Weight:  [114.6 kg] 114.6 kg (05/23 0311) Last BM Date: 01/09/21 General:   Intubated and Mildly responsive to external stimuli Head:  Normocephalic and atraumatic. Eyes:   No icterus.   Conjunctiva pink.  PERRLA. Ears:  Normal auditory acuity. Neck:  Supple; no masses or thyroidomegaly Lungs: Respirations even and unlabored. Lungs clear to auscultation bilaterally.   No wheezes, crackles, or rhonchi.  Heart:  Regular rate and rhythm;  Without murmur, clicks, rubs or gallops Abdomen:  Soft, nondistended, nontender. Normal bowel sounds. No appreciable masses or hepatomegaly.  No rebound or guarding.  Rectal:  Not performed. Msk:  Symmetrical without gross deformities.    Extremities:  Without edema, cyanosis or clubbing. Neurologic:  Unable to obtain. Skin:  Intact without significant lesions or rashes. Cervical Nodes:  No significant cervical adenopathy. Psych:  Intubated  LAB RESULTS: Recent Labs    01/08/21 0448  01/09/21 0509 01/10/21 0433  WBC 10.9* 9.6 9.3  HGB 10.5* 10.2* 9.9*  HCT 34.8* 32.8* 33.4*  PLT 490* 464* 437*   BMET Recent Labs    01/08/21 0448 01/08/21 1320 01/09/21 0509 01/10/21 0433  NA 145  --  147* 151*  K 5.5* 5.0 5.1 5.0  CL 119*  --  120* 124*  CO2 20*  --  20* 21*  GLUCOSE 169*  --  142* 180*  BUN 46*  --  54* 60*  CREATININE 1.47*  --  1.79* 1.53*  CALCIUM 8.9  --  8.8* 9.0   LFT No results for input(s): PROT, ALBUMIN, AST, ALT, ALKPHOS, BILITOT, BILIDIR, IBILI in the last 72 hours. PT/INR No results for input(s): LABPROT, INR in the last 72 hours.  STUDIES: No results found.    Impression / Plan:   Assessment: Active Problems:   Cardiogenic shock (HCC)   Cardiac arrest with successful resuscitation (HCC)   Flash pulmonary edema (HCC)   Cardiopulmonary arrest with successful resuscitation (HCC)   Acute respiratory failure with hypoxia and hypercapnia (HCC)   Acute kidney injury superimposed on CKD (HCC)   Transaminitis   Second degree heart block   Lactic acidosis   Aspiration pneumonia (HCC)   HFrEF (heart failure with reduced ejection fraction) (HCC)   Severe hypoxic-ischemic encephalopathy   SOB (shortness of breath)   Jeremy Browning is a 60 y.o. y/o male with With brain damage and cardiogenic shock with vent support.  The ICU team and family have decided on a tracheostomy and PEG tube placement.   Plan:  The patient will be set up for a PEG tube placement for tomorrow.  Have spoken to both the son and son's wife about the risks and benefits including bleeding infection and death.  They have agreed to proceeding with the PEG tube placement.  The ICU team has stopped the patient's anticoagulation and I will make him nothing by mouth for the procedure to be done tomorrow.  Thank you for involving me in the care of this patient.      LOS: 16 days   Midge Minium, MD, [facility] 01/10/2021, 6:16 PM,  Pager [phone] 7am-5pm  Check AMION for  5pm -7am coverage and on weekends   Note: This dictation was prepared with Dragon dictation along with smaller phrase technology. Any transcriptional errors that result from this process are unintentional.

## 2021-01-10 NOTE — Progress Notes (Addendum)
PHARMACY CONSULT NOTE  Pharmacy Consult for Electrolyte Monitoring and Replacement   Recent Labs: Potassium (mmol/L)  Date Value  01/10/2021 5.0   Magnesium (mg/dL)  Date Value  30/02/6225 2.6 (H)   Calcium (mg/dL)  Date Value  33/35/4562 9.0   Albumin (g/dL)  Date Value  56/38/9373 2.4 (L)   Phosphorus (mg/dL)  Date Value  42/87/6811 4.7 (H)   Sodium (mmol/L)  Date Value  01/10/2021 151 (H)   Corrected calcium:  9.4 mg/dL  Assessment:  60 year old male with obesity, diabetes mellitus, hypertension, and combined diastolic and systolic heart failure who presented to the emergency department yesterday in respiratory distress admitted following witnessed cardiac arrest. Pt is S/P CPR. Remains intubated. Pharmacy consulted for electrolyte monitoring and replacement.  PROSource 75mL TID Vital High Protein @ 72ml/hr Free water 60mL q4h  Goal of Therapy:  Electrolytes WNL  Plan:   No replacement indicated at this time.  On lokelma 10 mg daily.   Na 151 - free water flushes increased to 100 ml q4h.  Will follow with AM labs and replace as needed.  Pricilla Riffle, PharmD Clinical Pharmacist 01/10/2021 11:06 AM

## 2021-01-11 ENCOUNTER — Encounter
Admission: EM | Disposition: A | Discharge status: Skilled Nursing Facility | Payer: Self-pay | Source: Home / Self Care | Attending: Internal Medicine

## 2021-01-11 ENCOUNTER — Encounter: Payer: Self-pay | Admitting: Pulmonary Disease

## 2021-01-11 DIAGNOSIS — J810 Acute pulmonary edema: Secondary | ICD-10-CM | POA: Diagnosis not present

## 2021-01-11 DIAGNOSIS — R1312 Dysphagia, oropharyngeal phase: Secondary | ICD-10-CM

## 2021-01-11 DIAGNOSIS — I469 Cardiac arrest, cause unspecified: Secondary | ICD-10-CM | POA: Diagnosis not present

## 2021-01-11 HISTORY — PX: PEG PLACEMENT: SHX5437

## 2021-01-11 LAB — GLUCOSE, CAPILLARY
Glucose-Capillary: 126 mg/dL — ABNORMAL HIGH (ref 70–99)
Glucose-Capillary: 114 mg/dL — ABNORMAL HIGH (ref 70–99)
Glucose-Capillary: 106 mg/dL — ABNORMAL HIGH (ref 70–99)
Glucose-Capillary: 102 mg/dL — ABNORMAL HIGH (ref 70–99)
Glucose-Capillary: 148 mg/dL — ABNORMAL HIGH (ref 70–99)
Glucose-Capillary: 135 mg/dL — ABNORMAL HIGH (ref 70–99)

## 2021-01-11 LAB — BASIC METABOLIC PANEL
Anion gap: 7 (ref 5–15)
BUN: 59 mg/dL — ABNORMAL HIGH (ref 6–20)
CO2: 22 mmol/L (ref 22–32)
Calcium: 9.1 mg/dL (ref 8.9–10.3)
Chloride: 122 mmol/L — ABNORMAL HIGH (ref 98–111)
Creatinine, Ser: 1.42 mg/dL — ABNORMAL HIGH (ref 0.61–1.24)
GFR, Estimated: 57 mL/min — ABNORMAL LOW (ref >60)
Glucose, Bld: 133 mg/dL — ABNORMAL HIGH (ref 70–99)
Potassium: 5.1 mmol/L (ref 3.5–5.1)
Sodium: 151 mmol/L — ABNORMAL HIGH (ref 135–145)

## 2021-01-11 LAB — CBC
HCT: 34.2 % — ABNORMAL LOW (ref 39.0–52.0)
Hemoglobin: 10.1 g/dL — ABNORMAL LOW (ref 13.0–17.0)
MCH: 24.8 pg — ABNORMAL LOW (ref 26.0–34.0)
MCHC: 29.5 g/dL — ABNORMAL LOW (ref 30.0–36.0)
MCV: 84 fL (ref 80.0–100.0)
Platelets: 418 10*3/uL — ABNORMAL HIGH (ref 150–400)
RBC: 4.07 MIL/uL — ABNORMAL LOW (ref 4.22–5.81)
RDW: 17.2 % — ABNORMAL HIGH (ref 11.5–15.5)
WBC: 8.4 10*3/uL (ref 4.0–10.5)
nRBC: 0 % (ref 0.0–0.2)

## 2021-01-11 LAB — PHOSPHORUS: Phosphorus: 4.5 mg/dL (ref 2.5–4.6)

## 2021-01-11 LAB — MAGNESIUM: Magnesium: 2.6 mg/dL — ABNORMAL HIGH (ref 1.7–2.4)

## 2021-01-11 SURGERY — INSERTION, PEG TUBE
Anesthesia: General

## 2021-01-11 MED ORDER — VECURONIUM BROMIDE 10 MG IV SOLR
INTRAVENOUS | Status: AC
  Filled 2021-01-11: qty 20

## 2021-01-11 MED ORDER — PROPOFOL 1000 MG/100ML IV EMUL
5.0000 ug/kg/min | INTRAVENOUS | Status: DC
  Administered 2021-01-11: 40 ug/kg/min via INTRAVENOUS

## 2021-01-11 MED ORDER — PROPOFOL 1000 MG/100ML IV EMUL
INTRAVENOUS | Status: AC
  Filled 2021-01-11: qty 100

## 2021-01-11 MED ORDER — VECURONIUM BROMIDE 10 MG IV SOLR
20.0000 mg | Freq: Once | INTRAVENOUS | Status: AC
  Administered 2021-01-11: 20 mg via INTRAVENOUS

## 2021-01-11 NOTE — Progress Notes (Signed)
NAME:  Jeremy Browning, MRN:  [id number], DOB:  05/25/1961, LOS: 17 ADMISSION DATE:  12/25/2020 60 yo M presenting to [facility] ED from home via EMS with respiratory distress that had been progressively worsening over the course of 12/25/20. Per documentation family stated the patient was in his normal state of health until this morning when he began feeling unwell. He then called out stating he could not breathe. EMS found the patient hypoxic and in distress with SpO2 in the 70's on room air. Patient was place on CPAP, arriving to the ED unresponsive and pulseless. ED course: Upon arrival to ED patient was defibrillated x 1 and received CPR before achieving ROSC. Initial EKG suggestive of global ischemia, second EKG had possible LBBB vs IVCD. Patient was emergently intubated requiring mechanical ventilation. Per ED documentation STEMI team activated in the setting of a witnessed shockable cardiac arrest with clinical exam highly suggestive of pulmonary edema with pink frothy sputum throughout ET tubing.  Pertinent Medical History  Obesity Hypertension Diabetes mellitus Chronic renal insufficiency Combined systolic and diastolic heart failure  Significant Hospital Events: Including procedures, antibiotic start and stop dates in addition to other pertinent events   Cardiac catheterization on presentation with normal coronaries, good cardiac output, elevated pulmonary capillary wedge pressure.  5/7 severe resp failure 5/8remains on Vent 5/9remains on vent , multiorgan failureMRI shows brain injury 5/10remains on vent, multiorgan failure, brother updated 5/11severe resp failure 5/63failed weaning trials due to severe brain damage 5/20failed weaning trials severe hypoxia dn vent dyssynchrony, sister updated 5/14vent support, severe brain damage, prognosis is poor 5/16unresponsive on wake up assessment, becomes tachypneic when sedation is lightened 5/17no purposeful movement on wake up  assessment, becomes tachypneic, asynchronous with vent 5/18 remains critically ill, severe brain damage, patient made DNR 5/20 family decided on TRACH/PEG tube 5/23 severe brain damage, no further imaging needed   MICRO DATA 5/7STAPHYLOCOCCUS HOMINIS1/4 cultures MRSA NEG 5/8 sputum cx: "normal flora"  COVID And INF A/B NEG     Interim History / Subjective:  Remains on vent +multiorgan failure Severe brain damage Plan for PEG Tibe today        Objective   Blood pressure 124/61, pulse 65, temperature 98.6 F (37 C), temperature source Oral, resp. rate 16, height 6' 0.76" (1.848 m), weight 113.5 kg, SpO2 100 %.    Vent Mode: PRVC FiO2 (%):  [28 %] 28 % Set Rate:  [16 bmp] 16 bmp Vt Set:  [550 mL] 550 mL PEEP:  [5 cmH20] 5 cmH20 Plateau Pressure:  [15 cmH20] 15 cmH20   Intake/Output Summary (Last 24 hours) at 01/11/2021 0727 Last data filed at 01/11/2021 0600 Gross per 24 hour  Intake 535.73 ml  Output 2270 ml  Net -1734.27 ml   Filed Weights   01/09/21 0500 01/10/21 0311 01/11/21 0449  Weight: 117.9 kg 114.6 kg 113.5 kg      REVIEW OF SYSTEMS  PATIENT IS UNABLE TO PROVIDE COMPLETE REVIEW OF SYSTEMS DUE TO SEVERE CRITICAL ILLNESS AND TOXIC METABOLIC ENCEPHALOPATHY   PHYSICAL EXAMINATION:  Jeremy:critically ill appearing, +resp distress HEAD: Normocephalic, atraumatic.  EYES: Pupils equal, round, reactive to light.  No scleral icterus.  MOUTH: Moist mucosal membrane. NECK: Supple. PULMONARY: +rhonchi, +wheezing CARDIOVASCULAR: S1 and S2. Regular rate and rhythm. No murmurs, rubs, or gallops.  GASTROINTESTINAL: Soft, nontender, -distended. Positive bowel sounds.  MUSCULOSKELETAL: No swelling, clubbing, or edema.  NEUROLOGIC: obtunded SKIN:intact,warm,dry      ASSESSMENT AND PLAN SYNOPSIS   60 yo morbidly obese AAM  with acute and severe cardiac arrest NSTEMI with acute severe systolic cardiac failure with signs and symptoms of severe  anoxic encephalopathy. +PROTEUS PNEUMONIA  PATIENT IS NOT IMPROVING, PATIENT WITH MULTIORGAN FAILURE AND VERY POOR CHANCE OF MEANINGFUL RECOVERY  FAMILY HAS DECIDED ON AGGRESSIVE MEDICAL CARE WITH TRACH AND PEG TUBES    Severe ACUTE Hypoxic and Hypercapnic Respiratory Failure -continue Mechanical Ventilator support -continue Bronchodilator Therapy -Wean Fio2 and PEEP as tolerated -VAP/VENT bundle implementation Needs trach for survival   CARDIAC FAILURE-acute systolic dysfunction -oxygen as needed   CARDIAC ICU monitoring   ACUTE KIDNEY INJURY/Renal Failure -continue Foley Catheter-assess need -Avoid nephrotoxic agents -Follow urine output, BMP -Ensure adequate renal perfusion, optimize oxygenation -Renal dose medications   NEUROLOGY Acute toxic metabolic encephalopathy, need for sedation Goal RASS -2 to -3 Severe brain damage Will need artifical support to stay alive   INFECTIOUS DISEASE +PROTEUS PNEUMONIA -continue antibiotics as prescribed -follow up cultures    ENDO - ICU hypoglycemic\Hyperglycemia protocol -check FSBS per protocol   GI GI PROPHYLAXIS as indicated  NUTRITIONAL STATUS DIET-->TF's as tolerated Constipation protocol as indicated   ELECTROLYTES -follow labs as needed -replace as needed -pharmacy consultation and following      Best practice (right click and "Reselect all SmartList Selections" daily)  Diet:NPO Pain/Anxiety/Delirium protocol (if indicated):Yes(RASS goal -1,-2) VAP protocol (if indicated):Yes DVT prophylaxis:ON hold GI prophylaxis:H2B Glucose control:SSIYes Central venous access:Yes, and it is still needed Foley:Yes, and it is still needed Mobility:bed rest PT consulted:No DNR STATUS Disposition:ICU    Labs   CBC: Recent Labs  Lab 01/05/21 0348 01/06/21 0416 01/07/21 0440 01/08/21 0448 01/09/21 0509 01/10/21 0433 01/11/21 0436  WBC 10.3 11.7* 10.8* 10.9* 9.6 9.3 8.4   NEUTROABS 6.2 7.2 6.1 7.4 5.4  --   --   HGB 10.4* 11.2* 11.3* 10.5* 10.2* 9.9* 10.1*  HCT 33.2* 34.6* 37.4* 34.8* 32.8* 33.4* 34.2*  MCV 79.6* 80.5 81.5 81.7 81.0 83.1 84.0  PLT 466* 569* 560* 490* 464* 437* 418*    Basic Metabolic Panel: Recent Labs  Lab 01/07/21 0440 01/08/21 0448 01/08/21 1320 01/09/21 0509 01/10/21 0433 01/11/21 0436  NA 143 145  --  147* 151* 151*  K 5.1 5.5* 5.0 5.1 5.0 5.1  CL 118* 119*  --  120* 124* 122*  CO2 21* 20*  --  20* 21* 22  GLUCOSE 210* 169*  --  142* 180* 133*  BUN 36* 46*  --  54* 60* 59*  CREATININE 1.36* 1.47*  --  1.79* 1.53* 1.42*  CALCIUM 9.2 8.9  --  8.8* 9.0 9.1  MG 2.2 2.4  --  2.5* 2.6* 2.6*  PHOS 3.7 4.3  --  5.0* 4.7* 4.5   GFR: Estimated Creatinine Clearance: 72.8 mL/min (A) (by C-G formula based on SCr of 1.42 mg/dL (H)). Recent Labs  Lab 01/05/21 0348 01/06/21 0416 01/06/21 0559 01/07/21 0440 01/08/21 0448 01/09/21 0509 01/10/21 0433 01/11/21 0436  PROCALCITON 0.24  --  0.19  --   --   --   --   --   WBC 10.3   < >  --    < > 10.9* 9.6 9.3 8.4   < > = values in this interval not displayed.    Liver Function Tests: No results for input(s): AST, ALT, ALKPHOS, BILITOT, PROT, ALBUMIN in the last 168 hours. No results for input(s): LIPASE, AMYLASE in the last 168 hours. Recent Labs  Lab 01/07/21 0912  AMMONIA 22    ABG  Component Value Date/Time   PHART 7.37 12/29/2020 1032   PCO2ART 35 12/29/2020 1032   PO2ART 81 (L) 12/29/2020 1032   HCO3 20.2 12/29/2020 1032   ACIDBASEDEF 4.5 (H) 12/29/2020 1032   O2SAT 95.5 12/29/2020 1032     Coagulation Profile: No results for input(s): INR, PROTIME in the last 168 hours.  Cardiac Enzymes: No results for input(s): CKTOTAL, CKMB, CKMBINDEX, TROPONINI in the last 168 hours.  HbA1C: Hgb A1c MFr Bld  Date/Time Value Ref Range Status  12/25/2020 04:46 PM 5.9 (H) 4.8 - 5.6 % Final    Comment:    (NOTE) Pre diabetes:          5.7%-6.4%  Diabetes:               >6.4%  Glycemic control for   <7.0% adults with diabetes     CBG: Recent Labs  Lab 01/10/21 1121 01/10/21 1610 01/10/21 2009 01/10/21 2313 01/11/21 0300  GLUCAP 130* 155* 124* 122* 126*    Allergies Not on File     DVT/GI PRX  assessed I Assessed the need for Labs I Assessed the need for Foley I Assessed the need for Central Venous Line Family Discussion when available I Assessed the need for Mobilization I made an Assessment of medications to be adjusted accordingly Safety Risk assessment completed  CASE DISCUSSED IN MULTIDISCIPLINARY ROUNDS WITH ICU TEAM     Critical Care Time devoted to patient care services described in this note is 55 minutes.  Critical care was necessary to treat or prevent imminent or life-threatening deterioration. Overall, patient is critically ill, prognosis is guarded.  Patient with Multiorgan failure and at high risk for cardiac arrest and death.    Lucie Leather, M.D.  [facility]  Medical Director [facility] Medical Director [facility] Cardio-Pulmonary Department

## 2021-01-11 NOTE — TOC Progression Note (Addendum)
Transition of Care [facility]) - Progression Note    Patient Details  Name: Jeremy Browning MRN: [id number] Date of Birth: 1961-05-06  [facility] CM/SW Contact  Marina Goodell Phone Number:  [phone] 01/11/2021, 4:31 PM  Clinical Narrative:     CSW spoke with patient's sister Heinrich Fertig [phone], who will be the main contact.  CSW spoke about the denial from [facility] due to patient's poor rehabilitative prognosis.  CSW options available are long term care placement or home with home health and services.  Patient had PEG procedure today and 5/25 will have trach procedure tomorrow 5/25.  CSW stated I would begin long term care placement process and update Ms. Bogdanski once I had an update.  Ms. Brule verbalized understanding and stated she would like for the patient to be placed within an hour of her home in [place]. CSW stated specific or preferred long term care placement was not guaranteed and placement would also depend on whether the facility could meet the patient's care needs.  CSW stated of long term care placement could not be found, the other option was for the patient to return home with home health and care services provided by Medicaid. CSW spoke briefly about what some of the care needs the patient will have including 24 hour care.  CSW went over some of the specific care needs that may arise including, trach cleaning, vent management and wound care. Ms. Boodram verbalized understanding.  Expected Discharge Plan: Skilled Nursing Facility Barriers to Discharge: Continued Medical Work up  Expected Discharge Plan and Services Expected Discharge Plan: Skilled Nursing Facility In-house Referral: Clinical Social Work   Post Acute Care Choice: Skilled Nursing Facility                                         Social Determinants of Health (SDOH) Interventions    Readmission Risk Interventions No flowsheet data found.

## 2021-01-11 NOTE — Progress Notes (Signed)
PEG tube inserted at this time.

## 2021-01-11 NOTE — Progress Notes (Signed)
     Referral received for Jeremy Browning :goals of care discussion. Chart reviewed and updates received from Dr. Belia Heman. Goals are clear. Patient is DNR.   No further Palliative needs. We will sign-off. Please do not hesitate to re-consult as needed.  Thank you for your allowing PMT to assist in Jeremy Browning care.   Willette Alma, AGPCNP-BC Palliative Medicine Team  Phone: [phone]  NO CHARGE

## 2021-01-11 NOTE — Progress Notes (Signed)
Tube feeding and water flushes on hold per verbal order from [employer] @ 2400 for PEG tube in AM.

## 2021-01-11 NOTE — Anesthesia Preprocedure Evaluation (Signed)
Anesthesia Evaluation  Patient identified by MRN, date of birth, ID band Patient unresponsive    Reviewed: Allergy & Precautions, NPO status , Patient's Chart, lab work & pertinent test results  History of Anesthesia Complications Negative for: history of anesthetic complications  Airway Mallampati: Intubated  TM Distance: >3 FB     Dental  (+) Chipped, Poor Dentition   Pulmonary pneumonia,    Pulmonary exam normal        Cardiovascular hypertension, Normal cardiovascular exam+ dysrhythmias      Neuro/Psych negative neurological ROS  negative psych ROS   GI/Hepatic negative GI ROS, Neg liver ROS,   Endo/Other  diabetes, Type 2  Renal/GU Renal disease     Musculoskeletal   Abdominal   Peds  Hematology negative hematology ROS (+)   Anesthesia Other Findings Past Medical History: No date: Diabetes mellitus type II, non insulin dependent (HCC) [name]: Essential hypertension No date: Heart failure (HCC)     Comment:  Unknown details.  By report no cardiac surgery No date: Venous stasis dermatitis of left lower extremity  Past Surgical History: No date: CARDIAC CATHETERIZATION 12/25/2020: LEFT HEART CATH AND CORONARY ANGIOGRAPHY; N/A     Comment:  Procedure: LEFT HEART CATH AND CORONARY ANGIOGRAPHY;                Surgeon: Marykay Lex, MD;  Location: [facility] INVASIVE               CV LAB;  Service: Cardiovascular;  Laterality: N/A; 12/25/2020: RIGHT HEART CATH; N/A     Comment:  Procedure: RIGHT HEART CATH;  Surgeon: Marykay Lex,              MD;  Location: [facility] INVASIVE CV LAB;  Service:               Cardiovascular;  Laterality: N/A; No date: Unknown  BMI    Body Mass Index: 33.23 kg/m      Reproductive/Obstetrics negative OB ROS                             Anesthesia Physical Anesthesia Plan  ASA: IV  Anesthesia Plan: General ETT   Post-op Pain Management:     Induction: Intravenous  PONV Risk Score and Plan: Treatment may vary due to age or medical condition  Airway Management Planned: Oral ETT  Additional Equipment:   Intra-op Plan:   Post-operative Plan: Possible Post-op intubation/ventilation  Informed Consent: I have reviewed the patients History and Physical, chart, labs and discussed the procedure including the risks, benefits and alternatives for the proposed anesthesia with the patient or authorized representative who has indicated his/her understanding and acceptance.     Dental Advisory Given  Plan Discussed with: Anesthesiologist, CRNA and Surgeon  Anesthesia Plan Comments: (History and phone consent from the patients sister Jeremy Browning at [phone]   Sister consented for risks of anesthesia including but not limited to:  - adverse reactions to medications - damage to eyes, teeth, lips or other oral mucosa - nerve damage due to positioning  - sore throat or hoarseness - Damage to heart, brain, nerves, lungs, other parts of body or loss of life  She voiced understanding.)        Anesthesia Quick Evaluation

## 2021-01-11 NOTE — Op Note (Addendum)
[facility] Gastroenterology Patient Name: Jeremy Browning Procedure Date: 01/11/2021 11:36 AM MRN: [id number] Account #: [id number] Date of Birth: 10-17-1960 Admit Type: Inpatient Age: 60 Room: [facility] ENDO ROOM 4 Gender: Male Note Status: Supervisor Override Procedure:             Upper GI endoscopy Indications:           Dysphagia Providers:             Midge Minium MD, MD Referring MD:          No Local Md, MD (Referring MD) Medicines:             Monitored Anesthesia Care Complications:         No immediate complications. Procedure:             Pre-Anesthesia Assessment:                        - Prior to the procedure, a History and Physical was                         performed, and patient medications and allergies were                         reviewed. The patient's tolerance of previous                         anesthesia was also reviewed. The risks and benefits                         of the procedure and the sedation options and risks                         were discussed with the patient. All questions were                         answered, and informed consent was obtained. Prior                         Anticoagulants: The patient has taken Lovenox                         (enoxaparin), last dose was 1 day prior to procedure.                         ASA Grade Assessment: III - A patient with severe                         systemic disease. After reviewing the risks and                         benefits, the patient was deemed in satisfactory                         condition to undergo the procedure.                        After obtaining informed consent, the endoscope was  passed under direct vision. Throughout the procedure,                         the patient's blood pressure, pulse, and oxygen                         saturations were monitored continuously. The Endoscope                         was introduced through the mouth,  and advanced to the                         second part of duodenum. The upper GI endoscopy was                         accomplished without difficulty. The patient tolerated                         the procedure well. Findings:      The esophagus was normal.      The stomach was normal.      The examined duodenum was normal.      Placement of an externally removable PEG with no T-fasteners was       successfully completed. The external bumper was at the 3.0 cm marking on       the tube. Impression:            - Normal esophagus.                        - Normal stomach.                        - Normal examined duodenum.                        - An externally removable PEG placement was                         successfully completed.                        - No specimens collected. Recommendation:        - Please follow the post-PEG recommendations                         including: Nutrition consult for formula and volume,                         change dressing once per day, start using PEG today                         and clean site with soap and water daily and dry                         thoroughly. Procedure Code(s):     --- Professional ---                        [phone], Esophagogastroduodenoscopy, flexible,  transoral; with directed placement of percutaneous                         gastrostomy tube Diagnosis Code(s):     --- Professional ---                        R13.10, Dysphagia, unspecified CPT copyright 2019 American Medical Association. All rights reserved. The codes documented in this report are preliminary and upon coder review may  be revised to meet current compliance requirements. Midge Minium MD, MD 01/11/2021 12:04:26 PM This report has been signed electronically. Number of Addenda: 0 Note Initiated On: 01/11/2021 11:36 AM Estimated Blood Loss:  Estimated blood loss: none.      [facility]

## 2021-01-11 NOTE — [facility] Notes (Signed)
PHARMACY CONSULT NOTE  Pharmacy Consult for Electrolyte Monitoring and Replacement   Recent Labs: Potassium (mmol/L)  Date Value  01/11/2021 5.1   Magnesium (mg/dL)  Date Value  50/35/4656 2.6 (H)   Calcium (mg/dL)  Date Value  81/27/5170 9.1   Albumin (g/dL)  Date Value  01/74/9449 2.4 (L)   Phosphorus (mg/dL)  Date Value  67/59/1638 4.5   Sodium (mmol/L)  Date Value  01/11/2021 151 (H)   Corrected calcium:  9.4 mg/dL  Assessment:  60 year old male with obesity, diabetes mellitus, hypertension, and combined diastolic and systolic heart failure who presented to the emergency department yesterday in respiratory distress admitted following witnessed cardiac arrest. Pt is S/P CPR. Remains intubated. PEG tube placed 5/24, plan for tracheostomy 5/25. Pharmacy consulted for electrolyte monitoring and replacement.  PROSource 90 mL TID Vital High Protein @ 55 ml/hr Free water 150 mL q4h  Goal of Therapy:  Electrolytes WNL  Plan:   No replacement indicated at this time.  On Lokelma 10 mg daily.   Na 151 - free water flushes increased to 150 ml q4h.  Will follow with AM labs and replace as needed.  Pricilla Riffle, PharmD Clinical Pharmacist 01/11/2021 12:25 PM

## 2021-01-12 ENCOUNTER — Inpatient Hospital Stay: Payer: Medicaid Other | Admitting: Certified Registered"

## 2021-01-12 ENCOUNTER — Encounter
Admission: EM | Disposition: A | Discharge status: Skilled Nursing Facility | Payer: Medicaid Other | Source: Home / Self Care | Attending: Internal Medicine

## 2021-01-12 ENCOUNTER — Encounter: Payer: Self-pay | Admitting: Gastroenterology

## 2021-01-12 DIAGNOSIS — J9602 Acute respiratory failure with hypercapnia: Secondary | ICD-10-CM | POA: Diagnosis not present

## 2021-01-12 DIAGNOSIS — J9601 Acute respiratory failure with hypoxia: Secondary | ICD-10-CM | POA: Diagnosis not present

## 2021-01-12 DIAGNOSIS — I469 Cardiac arrest, cause unspecified: Secondary | ICD-10-CM | POA: Diagnosis not present

## 2021-01-12 HISTORY — PX: TRACHEOSTOMY TUBE PLACEMENT: SHX814

## 2021-01-12 LAB — CBC WITH DIFFERENTIAL/PLATELET
Abs Immature Granulocytes: 0.02 10*3/uL (ref 0.00–0.07)
Basophils Absolute: 0.1 10*3/uL (ref 0.0–0.1)
Basophils Relative: 1 %
Eosinophils Absolute: 0.4 10*3/uL (ref 0.0–0.5)
Eosinophils Relative: 5 %
HCT: 35.4 % — ABNORMAL LOW (ref 39.0–52.0)
Hemoglobin: 10.9 g/dL — ABNORMAL LOW (ref 13.0–17.0)
Immature Granulocytes: 0 %
Lymphocytes Relative: 27 %
Lymphs Abs: 2.3 10*3/uL (ref 0.7–4.0)
MCH: 25.4 pg — ABNORMAL LOW (ref 26.0–34.0)
MCHC: 30.8 g/dL (ref 30.0–36.0)
MCV: 82.5 fL (ref 80.0–100.0)
Monocytes Absolute: 0.8 10*3/uL (ref 0.1–1.0)
Monocytes Relative: 10 %
Neutro Abs: 5 10*3/uL (ref 1.7–7.7)
Neutrophils Relative %: 57 %
Platelets: 397 10*3/uL (ref 150–400)
RBC: 4.29 MIL/uL (ref 4.22–5.81)
RDW: 17.2 % — ABNORMAL HIGH (ref 11.5–15.5)
WBC: 8.6 10*3/uL (ref 4.0–10.5)
nRBC: 0 % (ref 0.0–0.2)

## 2021-01-12 LAB — BASIC METABOLIC PANEL
Anion gap: 8 (ref 5–15)
BUN: 68 mg/dL — ABNORMAL HIGH (ref 6–20)
CO2: 21 mmol/L — ABNORMAL LOW (ref 22–32)
Calcium: 9.1 mg/dL (ref 8.9–10.3)
Chloride: 122 mmol/L — ABNORMAL HIGH (ref 98–111)
Creatinine, Ser: 1.63 mg/dL — ABNORMAL HIGH (ref 0.61–1.24)
GFR, Estimated: 48 mL/min — ABNORMAL LOW (ref >60)
Glucose, Bld: 144 mg/dL — ABNORMAL HIGH (ref 70–99)
Potassium: 5.4 mmol/L — ABNORMAL HIGH (ref 3.5–5.1)
Sodium: 151 mmol/L — ABNORMAL HIGH (ref 135–145)

## 2021-01-12 LAB — POTASSIUM: Potassium: 5 mmol/L (ref 3.5–5.1)

## 2021-01-12 LAB — TRIGLYCERIDES: Triglycerides: 138 mg/dL (ref <150)

## 2021-01-12 LAB — GLUCOSE, CAPILLARY
Glucose-Capillary: 113 mg/dL — ABNORMAL HIGH (ref 70–99)
Glucose-Capillary: 168 mg/dL — ABNORMAL HIGH (ref 70–99)
Glucose-Capillary: 99 mg/dL (ref 70–99)
Glucose-Capillary: 110 mg/dL — ABNORMAL HIGH (ref 70–99)
Glucose-Capillary: 124 mg/dL — ABNORMAL HIGH (ref 70–99)
Glucose-Capillary: 117 mg/dL — ABNORMAL HIGH (ref 70–99)

## 2021-01-12 LAB — MAGNESIUM: Magnesium: 2.7 mg/dL — ABNORMAL HIGH (ref 1.7–2.4)

## 2021-01-12 LAB — PHOSPHORUS: Phosphorus: 4.8 mg/dL — ABNORMAL HIGH (ref 2.5–4.6)

## 2021-01-12 SURGERY — CREATION, TRACHEOSTOMY
Anesthesia: General

## 2021-01-12 MED ORDER — INSULIN ASPART 100 UNIT/ML IV SOLN
10.0000 [IU] | Freq: Once | INTRAVENOUS | Status: AC
  Administered 2021-01-12: 10 [IU] via INTRAVENOUS
  Filled 2021-01-12: qty 0.1

## 2021-01-12 MED ORDER — ROCURONIUM BROMIDE 10 MG/ML (PF) SYRINGE
PREFILLED_SYRINGE | INTRAVENOUS | Status: AC
  Filled 2021-01-12: qty 10

## 2021-01-12 MED ORDER — LIDOCAINE-EPINEPHRINE 1 %-1:100000 IJ SOLN
INTRAMUSCULAR | Status: DC | PRN
  Administered 2021-01-12: 8 mL

## 2021-01-12 MED ORDER — FENTANYL CITRATE (PF) 100 MCG/2ML IJ SOLN
INTRAMUSCULAR | Status: DC | PRN
  Administered 2021-01-12: 100 ug via INTRAVENOUS

## 2021-01-12 MED ORDER — LACTATED RINGERS IV SOLN: INTRAVENOUS | Status: DC | PRN

## 2021-01-12 MED ORDER — PROSOURCE TF PO LIQD
90.0000 mL | Freq: Three times a day (TID) | ORAL | Status: DC
  Administered 2021-01-12 – 2021-01-14 (×7): 90 mL
  Filled 2021-01-12 (×9): qty 90

## 2021-01-12 MED ORDER — FENTANYL CITRATE (PF) 100 MCG/2ML IJ SOLN
INTRAMUSCULAR | Status: AC
  Filled 2021-01-12: qty 2

## 2021-01-12 MED ORDER — GLYCOPYRROLATE 0.2 MG/ML IJ SOLN
0.1000 mg | Freq: Two times a day (BID) | INTRAMUSCULAR | Status: DC
  Administered 2021-01-12 – 2021-01-20 (×17): 0.1 mg via INTRAVENOUS
  Filled 2021-01-12 (×17): qty 1

## 2021-01-12 MED ORDER — FREE WATER: 200.0000 mL | Status: DC

## 2021-01-12 MED ORDER — DEXTROSE 50 % IV SOLN
1.0000 | Freq: Once | INTRAVENOUS | Status: AC
  Administered 2021-01-12: 50 mL via INTRAVENOUS
  Filled 2021-01-12: qty 50

## 2021-01-12 MED ORDER — FREE WATER
250.0000 mL | Status: DC
  Administered 2021-01-12 – 2021-01-23 (×66): 250 mL

## 2021-01-12 MED ORDER — LIDOCAINE-EPINEPHRINE 1 %-1:100000 IJ SOLN
INTRAMUSCULAR | Status: AC
  Filled 2021-01-12: qty 1

## 2021-01-12 MED ORDER — ROCURONIUM BROMIDE 100 MG/10ML IV SOLN
INTRAVENOUS | Status: DC | PRN
  Administered 2021-01-12: 20 mg via INTRAVENOUS
  Administered 2021-01-12: 50 mg via INTRAVENOUS
  Administered 2021-01-12: 30 mg via INTRAVENOUS

## 2021-01-12 MED ORDER — NEPRO/CARBSTEADY PO LIQD
1000.0000 mL | ORAL | Status: DC
  Administered 2021-01-12 – 2021-01-13 (×2): 1000 mL

## 2021-01-12 SURGICAL SUPPLY — 29 items
BLADE SURG 15 STRL LF DISP TIS (BLADE) ×1 IMPLANT
BLADE SURG 15 STRL SS (BLADE) ×2
BLADE SURG SZ11 CARB STEEL (BLADE) ×2 IMPLANT
CANISTER SUCT 1200ML W/VALVE (MISCELLANEOUS) ×2 IMPLANT
COVER WAND RF STERILE (DRAPES) ×2 IMPLANT
ELECT CAUTERY BLADE TIP 2.5 (TIP) ×2 IMPLANT
ELECT REM PT RETURN 9FT ADLT (ELECTROSURGICAL) ×2 IMPLANT
ELECTRODE CAUTERY BLDE TIP 2.5 (TIP) ×1 IMPLANT
ELECTRODE REM PT RTRN 9FT ADLT (ELECTROSURGICAL) ×1 IMPLANT
GLOVE SURG ENC MOIS LTX SZ7.5 (GLOVE) ×2 IMPLANT
GOWN STRL REUS W/ TWL LRG LVL3 (GOWN DISPOSABLE) ×2 IMPLANT
GOWN STRL REUS W/TWL LRG LVL3 (GOWN DISPOSABLE) ×4
HEMOSTAT SURGICEL 2X3 (HEMOSTASIS) ×2 IMPLANT
HLDR TRACH TUBE NECKBAND 18 (MISCELLANEOUS) ×1 IMPLANT
HOLDER TRACH TUBE NECKBAND 18 (MISCELLANEOUS) ×1
KIT TURNOVER KIT A (KITS) ×2 IMPLANT
LABEL OR SOLS (LABEL) ×2 IMPLANT
MANIFOLD NEPTUNE II (INSTRUMENTS) ×2 IMPLANT
NS IRRIG 500ML POUR BTL (IV SOLUTION) ×2 IMPLANT
PACK HEAD/NECK (MISCELLANEOUS) ×2 IMPLANT
SHEARS HARMONIC 9CM CVD (BLADE) ×2 IMPLANT
SPONGE DRAIN TRACH 4X4 STRL 2S (GAUZE/BANDAGES/DRESSINGS) ×2 IMPLANT
SPONGE KITTNER 5P (MISCELLANEOUS) ×2 IMPLANT
SUCTION FRAZIER HANDLE 10FR (MISCELLANEOUS) ×1
SUCTION TUBE FRAZIER 10FR DISP (MISCELLANEOUS) ×1 IMPLANT
SUT ETHILON 2 0 FS 18 (SUTURE) ×2 IMPLANT
SUT VIC AB 3-0 PS2 18 (SUTURE) ×2 IMPLANT
TUBE TRACH FLEX 8.0 CUFF (MISCELLANEOUS) ×2
TUBE TRACH FLEX 8.5 CUFF (MISCELLANEOUS) ×1 IMPLANT

## 2021-01-12 NOTE — OR Nursing (Signed)
Spoke with Charge RN/OR Director regarding consent. Was informed okay to continue without second RN signature due to MD signature.

## 2021-01-12 NOTE — Progress Notes (Addendum)
NAME:  Jeremy Browning, MRN:  [id number], DOB:  May 30, 1961, LOS: 18 ADMISSION DATE:  12/25/2020 60 yo M presenting to [facility] ED from home via EMS with respiratory distress that had been progressively worsening over the course of 12/25/20. Per documentation family stated the patient was in his normal state of health until this morning when he began feeling unwell. He then called out stating he could not breathe. EMS found the patient hypoxic and in distress with SpO2 in the 70's on room air. Patient was place on CPAP, arriving to the ED unresponsive and pulseless. ED course: Upon arrival to ED patient was defibrillated x 1 and received CPR before achieving ROSC. Initial EKG suggestive of global ischemia, second EKG had possible LBBB vs IVCD. Patient was emergently intubated requiring mechanical ventilation. Per ED documentation STEMI team activated in the setting of a witnessed shockable cardiac arrest with clinical exam highly suggestive of pulmonary edema with pink frothy sputum throughout ET tubing.  Pertinent Medical History  Obesity Hypertension Diabetes mellitus Chronic renal insufficiency Combined systolic and diastolic heart failure  Significant Hospital Events: Including procedures, antibiotic start and stop dates in addition to other pertinent events   Cardiac catheterization on presentation with normal coronaries, good cardiac output, elevated pulmonary capillary wedge pressure.  5/7 severe resp failure 5/8remains on Vent 5/9remains on vent , multiorgan failureMRI shows brain injury 5/10remains on vent, multiorgan failure, brother updated 5/11severe resp failure 5/49failed weaning trials due to severe brain damage 5/41failed weaning trials severe hypoxia dn vent dyssynchrony, sister updated 5/14vent support, severe brain damage, prognosis is poor 5/16unresponsive on wake up assessment, becomes tachypneic when sedation is lightened 5/17no purposeful movement on wake up  assessment, becomes tachypneic, asynchronous with vent 5/18remains critically ill, severe brain damage, patient made DNR 5/90family decided on TRACH/PEG tube 5/23severe brain damage, no further imaging needed 5/24 severe brain damage, PEG tube placed   MICRO DATA 5/7STAPHYLOCOCCUS HOMINIS1/4 cultures MRSA NEG 5/8 sputum cx: "normal flora"  COVID And INF A/B NEG      Interim History / Subjective:  Remains intubated Remains critically ill Severe brain damage Prognosis is very poor        Objective   Blood pressure (!) 151/62, pulse 87, temperature 99.3 F (37.4 C), resp. rate 16, height 6' 0.76" (1.848 m), weight 112.1 kg, SpO2 100 %.    Vent Mode: PRVC FiO2 (%):  [28 %] 28 % Set Rate:  [16 bmp] 16 bmp Vt Set:  [550 mL] 550 mL PEEP:  [5 cmH20] 5 cmH20   Intake/Output Summary (Last 24 hours) at 01/12/2021 1829 Last data filed at 01/12/2021 0600 Gross per 24 hour  Intake 564.43 ml  Output 2825 ml  Net -2260.57 ml   Filed Weights   01/10/21 0311 01/11/21 0449 01/12/21 0348  Weight: 114.6 kg 113.5 kg 112.1 kg      REVIEW OF SYSTEMS  PATIENT IS UNABLE TO PROVIDE COMPLETE REVIEW OF SYSTEMS DUE TO SEVERE CRITICAL ILLNESS AND TOXIC METABOLIC ENCEPHALOPATHY   PHYSICAL EXAMINATION:  GENERAL:critically ill appearing, +resp distress HEAD: Normocephalic, atraumatic.  EYES: Pupils equal, round, reactive to light.  No scleral icterus.  MOUTH: Moist mucosal membrane. NECK: Supple. PULMONARY: +rhonchi, +wheezing CARDIOVASCULAR: S1 and S2. Regular rate and rhythm. No murmurs, rubs, or gallops.  GASTROINTESTINAL: Soft, nontender, -distended. Positive bowel sounds.  MUSCULOSKELETAL: No swelling, clubbing, or edema.  NEUROLOGIC: obtunded SKIN:intact,warm,dry   Labs/imaging that I havepersonally reviewed  (right click and "Reselect all SmartList Selections" daily)  ASSESSMENT AND PLAN SYNOPSIS  60 yo morbidly obese AAM with acute and severe  cardiac arrest NSTEMI with acute severe systolic cardiac failure with signs and symptoms of severe anoxic encephalopathy. +PROTEUS PNEUMONIA  PATIENT IS NOT IMPROVING, PATIENT WITH MULTIORGAN FAILURE AND VERY POOR CHANCE OF MEANINGFUL RECOVERY  FAMILY HAS DECIDED ON AGGRESSIVE MEDICAL CARE WITH TRACH AND PEG TUBES  Patient will need artifical support permanently  Severe ACUTE Hypoxic and Hypercapnic Respiratory Failure -continue Mechanical Ventilator support -continue Bronchodilator Therapy -Wean Fio2 and PEEP as tolerated -VAP/VENT bundle implementation Awaiting TRACH today   CARDIAC FAILURE-acute  systolic dysfunction -oxygen as needed -Lasix as tolerated -follow up cardiac enzymes as indicated   CARDIAC ICU monitoring   ACUTE KIDNEY INJURY/Renal Failure -continue Foley Catheter-assess need -Avoid nephrotoxic agents -Follow urine output, BMP -Ensure adequate renal perfusion, optimize oxygenation -Renal dose medications   NEUROLOGY Acute toxic metabolic encephalopathy, need for sedation Goal RASS -2 to -3 Severe brain damage Will need artifical support to stay alive   INFECTIOUS DISEASE +PROTEUS PNEUMONIA -continue antibiotics as prescribed -follow up cultures   ENDO - ICU hypoglycemic\Hyperglycemia protocol -check FSBS per protocol   GI GI PROPHYLAXIS as indicated  NUTRITIONAL STATUS DIET-->TF's as tolerated Constipation protocol as indicated   ELECTROLYTES -follow labs as needed -replace as needed -pharmacy consultation and following   Best practice (right click and "Reselect all SmartList Selections" daily)  Diet:NPO Pain/Anxiety/Delirium protocol (if indicated):Yes(RASS goal -1,-2) VAP protocol (if indicated):Yes DVT prophylaxis:ON hold GI prophylaxis:H2B Glucose control:SSIYes Central venous access:Yes, and it is still needed Foley:Yes, and it is still needed Mobility:bed rest PT consulted:No DNR  STATUS Disposition:ICU    Labs   CBC: Recent Labs  Lab 01/06/21 0416 01/07/21 0440 01/08/21 0448 01/09/21 0509 01/10/21 0433 01/11/21 0436 01/12/21 0401  WBC 11.7* 10.8* 10.9* 9.6 9.3 8.4 8.6  NEUTROABS 7.2 6.1 7.4 5.4  --   --  5.0  HGB 11.2* 11.3* 10.5* 10.2* 9.9* 10.1* 10.9*  HCT 34.6* 37.4* 34.8* 32.8* 33.4* 34.2* 35.4*  MCV 80.5 81.5 81.7 81.0 83.1 84.0 82.5  PLT 569* 560* 490* 464* 437* 418* 397    Basic Metabolic Panel: Recent Labs  Lab 01/08/21 0448 01/08/21 1320 01/09/21 0509 01/10/21 0433 01/11/21 0436 01/12/21 0401  NA 145  --  147* 151* 151* 151*  K 5.5* 5.0 5.1 5.0 5.1 5.4*  CL 119*  --  120* 124* 122* 122*  CO2 20*  --  20* 21* 22 21*  GLUCOSE 169*  --  142* 180* 133* 144*  BUN 46*  --  54* 60* 59* 68*  CREATININE 1.47*  --  1.79* 1.53* 1.42* 1.63*  CALCIUM 8.9  --  8.8* 9.0 9.1 9.1  MG 2.4  --  2.5* 2.6* 2.6* 2.7*  PHOS 4.3  --  5.0* 4.7* 4.5 4.8*   GFR: Estimated Creatinine Clearance: 63 mL/min (A) (by C-G formula based on SCr of 1.63 mg/dL (H)). Recent Labs  Lab 01/06/21 0559 01/07/21 0440 01/09/21 0509 01/10/21 0433 01/11/21 0436 01/12/21 0401  PROCALCITON 0.19  --   --   --   --   --   WBC  --    < > 9.6 9.3 8.4 8.6   < > = values in this interval not displayed.    Liver Function Tests: No results for input(s): AST, ALT, ALKPHOS, BILITOT, PROT, ALBUMIN in the last 168 hours. No results for input(s): LIPASE, AMYLASE in the last 168 hours. Recent Labs  Lab 01/07/21 0912  AMMONIA  22    ABG    Component Value Date/Time   PHART 7.37 12/29/2020 1032   PCO2ART 35 12/29/2020 1032   PO2ART 81 (L) 12/29/2020 1032   HCO3 20.2 12/29/2020 1032   ACIDBASEDEF 4.5 (H) 12/29/2020 1032   O2SAT 95.5 12/29/2020 1032     Coagulation Profile: No results for input(s): INR, PROTIME in the last 168 hours.  Cardiac Enzymes: No results for input(s): CKTOTAL, CKMB, CKMBINDEX, TROPONINI in the last 168 hours.  HbA1C: Hgb A1c MFr Bld   Date/Time Value Ref Range Status  12/25/2020 04:46 PM 5.9 (H) 4.8 - 5.6 % Final    Comment:    (NOTE) Pre diabetes:          5.7%-6.4%  Diabetes:              >6.4%  Glycemic control for   <7.0% adults with diabetes     CBG: Recent Labs  Lab 01/11/21 1606 01/11/21 1924 01/11/21 2324 01/12/21 0359 01/12/21 0730  GLUCAP 102* 148* 135* 113* 168*    Allergies Not on File     DVT/GI PRX  assessed I Assessed the need for Labs I Assessed the need for Foley I Assessed the need for Central Venous Line Family Discussion when available I Assessed the need for Mobilization I made an Assessment of medications to be adjusted accordingly Safety Risk assessment completed  CASE DISCUSSED IN MULTIDISCIPLINARY ROUNDS WITH ICU TEAM     Critical Care Time devoted to patient care services described in this note is 55 minutes.  Critical care was necessary to treat or prevent imminent or life-threatening deterioration. Overall, patient is critically ill, prognosis is guarded.  Patient with Multiorgan failure and at high risk for cardiac arrest and death.    Lucie Leather, M.D.  [facility]  Medical Director [facility] Medical Director [facility] Cardio-Pulmonary Department

## 2021-01-12 NOTE — Op Note (Signed)
..  01/12/2021 9:38 AM  [name] [id number]  Pre-Op Dx: Cardiac arrest, respiratory failure  Post-Op Dx:  same  Proc:  Tracheostomy  Surg:  Bud Face  Anes:  GOT  EBL:  <26ml  Comp:  none  Findings:  Tracheostomy Shiley Size 8 cuffed placed through tracheal ring 2 and 3  Procedure:  The patient was brought from the intensive care unit to the operating room and transferred to an operating table.  Anesthesia was administered per indwelling orotracheal tube.   Neck extension was achieved as possible anda shoulder rolke was placed.  The lower neck was palpated with the findings as described above.  1% Xylocaine with 1:100,000 epinephrine, 8 cc's, was infiltrated into the surgical field for intraoperative hemostasis.  Several minutes were allowed for this to take effect. The patient was prepped in a sterile fashion with a surgical prep from the chin down to the upper chest.  Sterile draping was accomplished in the standard fashion.  A  5 cm horizontal incision was made sharply a finger's breadth above the sternal notch, and extended through skin and subcutaneous fat.  Using cautery, the superficial layer of the deep cervical fascia was lysed.  Additional dissection revealed the strap muscles.  The midline raphe was divided in two layers and the muscles retracted laterally.  The pretracheal plane was visualized.  This was entered bluntly.  The thyroid isthmus was isolated and divided with the Harmonic scalpel.  The thyroid gland was retracted to either side.  The anterior face of the trachea was cleared.  In the  2nd-3rd interspace, a transverse incision was made between cartilage rings into the tracheal lumen.  A 6 mm wide inferiorly based flap was generated and secured to the lower wound with a 4-0 chromic suture.   A previously tested  # 8 Shiley cuffed tracheostomy tube was brought into the field.  With the endotracheal tube under direct visualization through the tracheostomy, it was  gently backed up.  The tracheostomy tube was inserted into the tracheal lumen.  Hemostasis was observed. The cuff was inflated and observed to be intact and containing pressure. The inner cannula was placed and ventilation assumed per tracheostomy tube.  Good chest wall motion was observed, and CO2 was documented per anesthesia.  The trach tube was secured in the standard fashion with trach ties. A 2-0 Nylon suture was used to secure the trach tube to the skin on both sides.  Hemostasis was observed again.  When satisfactory ventilation was assured, the orotracheal tube was removed.  At this point the procedure was completed.  The patient was returned to anesthesia, awakened as possible, and transferred back to the intensive care unit in stable condition.  Comment: 60 y.o. male with prolonged ventilation was the indication for today's procedure.  Anticipate a routine postoperative recovery including standard tracheal hygiene.  The sutures should be removed in 5 days.  When the patient no longer requires ventilator or pressure support, the cuff should be deflated.  Changing to an uncuffed tube and downsizing will be according to the clinical condition of the patient.   Roney Mans Legaci Tarman  9:38 AM 01/12/2021

## 2021-01-12 NOTE — Transfer of Care (Signed)
Immediate Anesthesia Transfer of Care Note  Patient: Jeremy Browning  Procedure(s) Performed: TRACHEOSTOMY (N/A )  Patient Location: ICU  Anesthesia Type:General  Level of Consciousness: sedated, unresponsive and Patient remains intubated per anesthesia plan  Airway & Oxygen Therapy: Patient connected to tracheostomy mask oxygen and Patient remains intubated per anesthesia plan  Post-op Assessment: Report given to RN and Post -op Vital signs reviewed and stable  Post vital signs: Reviewed and stable  Last Vitals:  Vitals Value Taken Time  BP    Temp    Pulse    Resp    SpO2      Last Pain:  Vitals:   01/12/21 0424  TempSrc: Axillary  PainSc:          Complications: No complications documented.

## 2021-01-12 NOTE — Progress Notes (Signed)
Nutrition Follow Up Note   DOCUMENTATION CODES:   Obesity unspecified  INTERVENTION:   Change to Nepro 1.8_0 /hr- Initiate at 61m/hr and increase by 17mhr q 8 hours until goal rate is reached.   Pro-Source 9072mID via tube, provides 40kcal and 11g of protein per serving   Free water flushes 250m53m hours to maintain tube patency   Regimen provides 2400kcal/day, 163g/day protein and 2372ml54m free water   NUTRITION DIAGNOSIS:   Inadequate oral intake related to inability to eat (pt sedated and ventilated) as evidenced by NPO status.  GOAL:   Provide needs based on ASPEN/SCCM guidelines  -met with tube feeds   MONITOR:   Vent status,Labs,Weight trends,TF tolerance,Skin,I & O's  ASSESSMENT:   59 y.53 black male with diabetes mellitus type II, hypertension and congestive heart failure who was admitted to [facility]/02/2021 for cardiogenic shock and flash pulmonary edema  Pt s/p PEG tube 5/24 and trach today. Plan is to resume tube feeds. RD will change pt over to Nepro as pt with mild hyperkalemia.   Per chart, pt is down 17lbs(6%) since admit; this is likely r/t fluid changes.   Medications reviewed and include: colace, pepcid, insulin, miralax, lokelma, ceftriaxone, fentanyl  Labs reviewed: Na 151(H), K 5.0 wnl, BUN 68(H), creat 1.63(H), P 4.8(H), Mg 2.7(H) Hgb 10.9(L), Hct 35.4(L) cbgs- 113, 168, 99 x 24 hrs  Patient is currently intubated on ventilator support MV: 10.3 L/min Temp (24hrs), Avg:98.7 F (37.1 C), Min:97.3 F (36.3 C), Max:99.8 F (37.7 C)  Propofol: none   MAP- >65mmH73mUOP- 2825ml  20mt Order:   Diet Order            Diet NPO time specified  Diet effective now                EDUCATION NEEDS:   No education needs have been identified at this time  Skin:  Skin Assessment: Reviewed RN Assessment  Last BM:  5/22- type 6  Height:   Ht Readings from Last 1 Encounters:  01/07/21 6' 0.76" (1.848 m)    Weight:   Wt  Readings from Last 1 Encounters:  01/12/21 112.1 kg    BMI:  Body mass index is 32.82 kg/m.  Estimated Nutritional Needs:   Kcal:  2267kcal/day  Protein:  >160g/day  Fluid:  2.4-2.7L/day  Bryley Kovacevic CKoleen Distance, LDN [facility] and/or RD on-call/weekend/after hours pager

## 2021-01-12 NOTE — Progress Notes (Signed)
PHARMACY CONSULT NOTE  Pharmacy Consult for Electrolyte Monitoring and Replacement   Recent Labs: Potassium (mmol/L)  Date Value  01/12/2021 5.0   Magnesium (mg/dL)  Date Value  63/08/6008 2.7 (H)   Calcium (mg/dL)  Date Value  93/23/5573 9.1   Albumin (g/dL)  Date Value  22/09/5425 2.4 (L)   Phosphorus (mg/dL)  Date Value  02/11/7627 4.8 (H)   Sodium (mmol/L)  Date Value  01/12/2021 151 (H)   Corrected calcium:  9.4 mg/dL  Assessment:  60 year old male with obesity, diabetes mellitus, hypertension, and combined diastolic and systolic heart failure who presented to the emergency department yesterday in respiratory distress admitted following witnessed cardiac arrest. Pt is S/P CPR. MRI with infarcts. PEG tube placed 5/24, tracheostomy 5/25. Remains on ventilator. Pharmacy consulted for electrolyte monitoring and replacement.  PROSource 90 mL TID Nepro 50 ml/hr Free water 250 mL q4h  Goal of Therapy:  Electrolytes WNL  Plan:   No replacement indicated at this time. Electrolytes trending up.  Change tube feeds to Nepro.  On Lokelma 10 mg daily.   Na 151 - free water flushes increased to 250 ml q4h.  Continue to follow along.  Pricilla Riffle, PharmD Clinical Pharmacist 01/12/2021 11:33 AM

## 2021-01-13 DIAGNOSIS — G939 Disorder of brain, unspecified: Secondary | ICD-10-CM | POA: Diagnosis not present

## 2021-01-13 DIAGNOSIS — I469 Cardiac arrest, cause unspecified: Secondary | ICD-10-CM | POA: Diagnosis not present

## 2021-01-13 DIAGNOSIS — J810 Acute pulmonary edema: Secondary | ICD-10-CM | POA: Diagnosis not present

## 2021-01-13 LAB — CBC WITH DIFFERENTIAL/PLATELET
Abs Immature Granulocytes: 0.02 10*3/uL (ref 0.00–0.07)
Basophils Absolute: 0 10*3/uL (ref 0.0–0.1)
Basophils Relative: 0 %
Eosinophils Absolute: 0.4 10*3/uL (ref 0.0–0.5)
Eosinophils Relative: 5 %
HCT: 34.3 % — ABNORMAL LOW (ref 39.0–52.0)
Hemoglobin: 10.3 g/dL — ABNORMAL LOW (ref 13.0–17.0)
Immature Granulocytes: 0 %
Lymphocytes Relative: 28 %
Lymphs Abs: 2.2 10*3/uL (ref 0.7–4.0)
MCH: 24.6 pg — ABNORMAL LOW (ref 26.0–34.0)
MCHC: 30 g/dL (ref 30.0–36.0)
MCV: 82.1 fL (ref 80.0–100.0)
Monocytes Absolute: 0.8 10*3/uL (ref 0.1–1.0)
Monocytes Relative: 10 %
Neutro Abs: 4.5 10*3/uL (ref 1.7–7.7)
Neutrophils Relative %: 57 %
Platelets: 310 10*3/uL (ref 150–400)
RBC: 4.18 MIL/uL — ABNORMAL LOW (ref 4.22–5.81)
RDW: 17 % — ABNORMAL HIGH (ref 11.5–15.5)
WBC: 7.9 10*3/uL (ref 4.0–10.5)
nRBC: 0 % (ref 0.0–0.2)

## 2021-01-13 LAB — GLUCOSE, CAPILLARY
Glucose-Capillary: 133 mg/dL — ABNORMAL HIGH (ref 70–99)
Glucose-Capillary: 120 mg/dL — ABNORMAL HIGH (ref 70–99)
Glucose-Capillary: 131 mg/dL — ABNORMAL HIGH (ref 70–99)
Glucose-Capillary: 129 mg/dL — ABNORMAL HIGH (ref 70–99)
Glucose-Capillary: 128 mg/dL — ABNORMAL HIGH (ref 70–99)
Glucose-Capillary: 131 mg/dL — ABNORMAL HIGH (ref 70–99)

## 2021-01-13 LAB — BASIC METABOLIC PANEL
Anion gap: 8 (ref 5–15)
BUN: 62 mg/dL — ABNORMAL HIGH (ref 6–20)
CO2: 21 mmol/L — ABNORMAL LOW (ref 22–32)
Calcium: 9 mg/dL (ref 8.9–10.3)
Chloride: 122 mmol/L — ABNORMAL HIGH (ref 98–111)
Creatinine, Ser: 1.76 mg/dL — ABNORMAL HIGH (ref 0.61–1.24)
GFR, Estimated: 44 mL/min — ABNORMAL LOW (ref >60)
Glucose, Bld: 151 mg/dL — ABNORMAL HIGH (ref 70–99)
Potassium: 5 mmol/L (ref 3.5–5.1)
Sodium: 151 mmol/L — ABNORMAL HIGH (ref 135–145)

## 2021-01-13 LAB — PHOSPHORUS: Phosphorus: 4.8 mg/dL — ABNORMAL HIGH (ref 2.5–4.6)

## 2021-01-13 LAB — MAGNESIUM: Magnesium: 2.8 mg/dL — ABNORMAL HIGH (ref 1.7–2.4)

## 2021-01-13 MED ORDER — ATORVASTATIN CALCIUM 80 MG PO TABS
80.0000 mg | ORAL_TABLET | Freq: Every day | ORAL | Status: DC
  Administered 2021-01-13 – 2021-01-29 (×17): 80 mg
  Filled 2021-01-13 (×2): qty 4
  Filled 2021-01-13: qty 1
  Filled 2021-01-13: qty 4
  Filled 2021-01-13: qty 1
  Filled 2021-01-13 (×3): qty 4
  Filled 2021-01-13 (×2): qty 1
  Filled 2021-01-13: qty 4
  Filled 2021-01-13: qty 1
  Filled 2021-01-13 (×5): qty 4
  Filled 2021-01-13: qty 1

## 2021-01-13 MED ORDER — CARVEDILOL 12.5 MG PO TABS
12.5000 mg | ORAL_TABLET | Freq: Two times a day (BID) | ORAL | Status: DC
  Administered 2021-01-13 – 2021-02-03 (×42): 12.5 mg
  Filled 2021-01-13 (×43): qty 1

## 2021-01-13 MED ORDER — ASPIRIN 81 MG PO CHEW
81.0000 mg | CHEWABLE_TABLET | Freq: Every day | ORAL | Status: DC
  Administered 2021-01-13 – 2021-02-25 (×44): 81 mg
  Filled 2021-01-13 (×45): qty 1

## 2021-01-13 MED ORDER — ENOXAPARIN SODIUM 60 MG/0.6ML IJ SOSY
0.5000 mg/kg | PREFILLED_SYRINGE | INTRAMUSCULAR | Status: DC
  Administered 2021-01-13 – 2021-02-04 (×23): 55 mg via SUBCUTANEOUS
  Filled 2021-01-13 (×25): qty 0.55

## 2021-01-13 MED ORDER — FUROSEMIDE 20 MG PO TABS
80.0000 mg | ORAL_TABLET | Freq: Every day | ORAL | Status: DC
  Administered 2021-01-13 – 2021-01-14 (×2): 80 mg
  Filled 2021-01-13 (×2): qty 4

## 2021-01-13 NOTE — Progress Notes (Signed)
Orthopedic Tech Progress Note Patient Details:  [name] March 16, 1961 [id number]  Called in bilateral resting hand splints to [facility].  Patient ID: Jeremy Browning, male   DOB: [birth year], 60 y.o.   MRN: [id number]   Docia Furl 01/13/2021, 1:14 PM

## 2021-01-13 NOTE — Evaluation (Addendum)
Physical Therapy Evaluation Patient Details Name: Jeremy Browning MRN: [id number] DOB: Oct 03, 1960 Today's Date: 01/13/2021   History of Present Illness  Pt is a 60 yo male that presented from home with respiratory distress 12/25/2020. Underwent defibrillation and CPR, emergently intubated. During admission pt has had a cardiac cath, peg tube and tracheostomy placed, weaned from vent 5/26 AM. 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 Mildly restricted diffusion involving the cortex more diffusely over the right cerebral convexity favored to reflect an acute MCA territory infarct over global hypoxic injury given unilaterality".    Clinical Impression  Pt on trach collar, respiratory therapist at bedside upon PT entrance. Pt nonverbal throughout, no noticeable efforts for vocalization during evaluation. The patient exhibited sporadic occasional visual attending to PT when in direct line of sight. Pt also able to lift eye brows/furrow eye brows spontaneously. The patient also demonstrated automatic responses/reflexes to noxious stimuli. LUE > exhibited increased tone compared to R, pt with grimacing with RUE, LUE and RLE movement. Unable to obtain TKE on RLE, maintained on LLE. Hand splints applied bilaterally during session as well. The patient would benefit from a trial of PT services for 3-5 visits to continue to assess and provide family/pt education on positioning, provide sensory and environmental stimuli as well as visual tracking and attending to environment as able.    Follow Up Recommendations LTACH    Equipment Recommendations  None recommended by PT    Recommendations for Other Services       Precautions / Restrictions Precautions Precautions: Fall Precaution Comments: trach Restrictions Weight Bearing Restrictions: No      Mobility  Bed Mobility               General bed mobility comments: not performed    Transfers                     Ambulation/Gait                Stairs            Wheelchair Mobility    Modified Rankin (Stroke Patients Only)       Balance                                             Pertinent Vitals/Pain Pain Assessment: Faces Pain Location: pt grimaces, withdraws from noxious stimuli Pain Descriptors / Indicators: Grimacing Pain Intervention(s): Limited activity within patient's tolerance;Repositioned    Home Living                   Additional Comments: pt unable to answer questions at this time    Prior Function                 Hand Dominance        Extremity/Trunk Assessment   Upper Extremity Assessment Upper Extremity Assessment:  (pt with some reflexic withdrawing. LUE tone > RUE tone. Pt withdrawns from noxious sensory stimuli)    Lower Extremity Assessment Lower Extremity Assessment:  (pt with some reflexic withdrawing. LUE tone > RUE tone. Pt withdrawns from noxious sensory stimuli)       Communication      Cognition Arousal/Alertness: Awake/alert Behavior During Therapy: Flat affect Overall Cognitive Status: No family/caregiver present to determine baseline cognitive functioning  General Comments: s/p trach. pt non verbal. no words attempted with PT assessment      General Comments      Exercises Other Exercises Other Exercises: PROM finger/wrist extension bilaterally. Pt able to achieve TKE on LLE, noted for beginning of R hamstring. some clonus of bilateral LE noted, DF/PF maintained passively. Grimaces to noxious stimuli to foot as well. Other Exercises: Pt encouraged to attend/track visually as able. bilateral hand splints placed. Pt head repositioned as able, pt with increased tension and with head turned to the R.   Assessment/Plan    PT Assessment Patient needs continued PT services (pt appropriate for 3-5 session trial of PT services)   PT Problem List Decreased strength;Decreased range of motion;Decreased activity tolerance;Decreased balance;Decreased mobility;Decreased coordination;Decreased safety awareness;Decreased skin integrity;Decreased knowledge of use of DME;Decreased cognition;Impaired tone       PT Treatment Interventions DME instruction;Balance training;Neuromuscular re-education;Gait training;Stair training;Functional mobility training;Therapeutic activities;Patient/family education;Therapeutic exercise    PT Goals (Current goals can be found in the Care Plan section)  Acute Rehab PT Goals PT Goal Formulation: Patient unable to participate in goal setting Time For Goal Achievement: 01/27/21 Potential to Achieve Goals: Poor Additional Goals Additional Goal #1: Family will perform proper positioning of patient for skin protection, to maximize appropriate environmental stimuli, and safety. Additional Goal #2: family will be able to perform PROM exercises to maintain pt joint integrity, skin protection, and improve ease of bed mobility of pt    Frequency Min 2X/week (pt appropriate for 3-5 session trial of PT services)   Barriers to discharge Decreased caregiver support;Inaccessible home environment      Co-evaluation               AM-PAC PT "6 Clicks" Mobility  Outcome Measure Help needed turning from your back to your side while in a flat bed without using bedrails?: Total Help needed moving from lying on your back to sitting on the side of a flat bed without using bedrails?: Total Help needed moving to and from a bed to a chair (including a wheelchair)?: Total Help needed standing up from a chair using your arms (e.g., wheelchair or bedside chair)?: Total Help needed to walk in hospital room?: Total Help needed climbing 3-5 steps with a railing? : Total 6 Click Score: 6    End of Session Equipment Utilized During Treatment: Oxygen (trach collar) Activity Tolerance: Patient tolerated treatment  well Patient left: in bed;with bed alarm set Nurse Communication: Other (comment) (positioning needs) PT Visit Diagnosis: Other abnormalities of gait and mobility (R26.89);Muscle weakness (generalized) (M62.81);Other symptoms and signs involving the nervous system (R29.898)    Time: 7711-6579 PT Time Calculation (min) (ACUTE ONLY): 47 min   Charges:   PT Evaluation $PT Eval High Complexity: 1 High PT Treatments $Neuromuscular Re-education: 8-22 mins        Olga Coaster PT, DPT 4:33 PM,01/13/21

## 2021-01-13 NOTE — Progress Notes (Signed)
PHARMACY CONSULT NOTE  Pharmacy Consult for Electrolyte Monitoring and Replacement   Recent Labs: Potassium (mmol/L)  Date Value  01/13/2021 5.0   Magnesium (mg/dL)  Date Value  63/84/5364 2.8 (H)   Calcium (mg/dL)  Date Value  68/10/2120 9.0   Albumin (g/dL)  Date Value  48/25/0037 2.4 (L)   Phosphorus (mg/dL)  Date Value  04/88/8916 4.8 (H)   Sodium (mmol/L)  Date Value  01/13/2021 151 (H)   Corrected calcium:  9.4 mg/dL  Assessment:  60 year old male with obesity, diabetes mellitus, hypertension, and combined diastolic and systolic heart failure who presented to the emergency department yesterday in respiratory distress admitted following witnessed cardiac arrest. Pt is S/P CPR. MRI with infarcts. PEG tube placed 5/24, tracheostomy 5/25. Remains on ventilator. Pharmacy consulted for electrolyte monitoring and replacement.  PROSource 90 mL TID Nepro 50 ml/hr Free water 250 mL q4h  Goal of Therapy:  Electrolytes WNL  Plan:   Electrolytes trending up.  Tube feeds changed to Nepro.  On Lokelma 10 mg daily.   Na 151 - free water flushes increased to 250 ml q4h.  Continue to follow along.  Pricilla Riffle, PharmD Clinical Pharmacist 01/13/2021 10:55 AM

## 2021-01-13 NOTE — Plan of Care (Signed)
  Problem: Nutrition: Goal: Adequate nutrition will be maintained Outcome: Progressing   Problem: Elimination: Goal: Will not experience complications related to bowel motility Outcome: Progressing   Problem: Pain Managment: Goal: General experience of comfort will improve Outcome: Progressing   Problem: Safety: Goal: Ability to remain free from injury will improve Outcome: Progressing   Problem: Activity: Goal: Ability to tolerate increased activity will improve Outcome: Progressing

## 2021-01-13 NOTE — Anesthesia Postprocedure Evaluation (Signed)
Anesthesia Post Note  Patient: Jeremy Browning  Procedure(s) Performed: TRACHEOSTOMY (N/A )  Patient location during evaluation: SICU Anesthesia Type: General Level of consciousness: sedated Pain management: pain level controlled Vital Signs Assessment: post-procedure vital signs reviewed and stable Respiratory status: patient remains intubated per anesthesia plan and patient on ventilator - see flowsheet for VS Cardiovascular status: stable Postop Assessment: no apparent nausea or vomiting Anesthetic complications: no   No complications documented.   Last Vitals:  Vitals:   01/13/21 0600 01/13/21 0700  BP: (!) 140/53 (!) 155/59  Pulse: 66 76  Resp: 16 16  Temp:    SpO2: 100% 100%    Last Pain:  Vitals:   01/12/21 2000  TempSrc: Axillary  PainSc:                  Lynden Oxford

## 2021-01-13 NOTE — Evaluation (Signed)
Occupational Therapy Evaluation Patient Details Name: Jeremy Browning MRN: [id number] DOB: 03/11/1961 Today's Date: 01/13/2021    History of Present Illness Pt is a 60 yo male that presented from home with respiratory distress 12/25/2020. Underwent defibrillation and CPR, emergently intubated. During admission pt has had a cardiac cath, peg tube and tracheostomy placed, weaned from vent 5/26 AM. 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 Mildly restricted diffusion involving the cortex more diffusely over the right cerebral convexity favored to reflect an acute MCA territory infarct over global hypoxic injury given unilaterality".   Clinical Impression   Pt seen for OT evaluation this date in setting of prolonged and complicated hospital stay including intubation with eventual transition to trach and PEG tube. Pt's care team with concerns regarding positioning and contractures. Pt presents this date with little to no purposeful interaction with therapist despite significant attempts. He primarily only reacts/responds to noxious stimuli. On assessment, increased tone noted to L UE/L LE versus R side and R UE with increased edema versus L. Pt presents this date limited ability to communicate, follow commands, nor complete any purposeful movements or respond to any commands. Pt does seem to make an attempt to verbally communicate when OT initially presents and gives introduction, but other than that, does not move his lips in an attempt at verbalization. Intermittently, it appears pt makes a remote attempt to follow a command such as wiggling digits of L hand, but difficult to ascertain if this is hypertonicity versus attempt at volitional ROM. OT educates pt's care team re: positioning recommendations, resting hand splint wear time and hygiene recommendations. Will trial occupational therapy services with patient to enhance pt stimulation and address contracture  prevention. However, should pt not make gradual progress in regards to ability to meaningfully participate, will relinquish contracture management and PROM routine to nursing staff to carryover in an effort to optimize pt skin integrity and prevent infection/worsening edema.    Follow Up Recommendations  LTACH    Equipment Recommendations  Other (comment) (TBD, will likely require hospital bed)    Recommendations for Other Services       Precautions / Restrictions Precautions Precautions: Fall Precaution Comments: trach, PEG Restrictions Weight Bearing Restrictions: No      Mobility Bed Mobility               General bed mobility comments: TOTAL A +2 for all rolling for ADL purposes such as peri care.    Transfers                 General transfer comment: unable/not appropraite    Balance                                           ADL either performed or assessed with clinical judgement   ADL                                         General ADL Comments: TOTAL A for all aspects of self care     Vision   Additional Comments: unable to formally assess as pt unable to visually attend at this time. Pt's eyes are open, but does not purposefully track. Pt primarily with gaze shifted toward R side.  Perception     Praxis      Pertinent Vitals/Pain Pain Assessment: Faces Pain Location: pt grimaces, withdraws from noxious stimuli Pain Descriptors / Indicators: Grimacing Pain Intervention(s): Limited activity within patient's tolerance;Repositioned     Hand Dominance     Extremity/Trunk Assessment Upper Extremity Assessment Upper Extremity Assessment:  (increasd tone in L UE than R, R with increased flaccidity versus L. Pt with some reactionary responses, but overall no purposeful ROM.)   Lower Extremity Assessment Lower Extremity Assessment:  (withdraws from noxious stimuli. Pt with increased L LE tone versus R.)        Communication     Cognition Arousal/Alertness: Awake/alert (eyes open) Behavior During Therapy: Flat affect Overall Cognitive Status: No family/caregiver present to determine baseline cognitive functioning                                 General Comments: Pt with no puroseful pursuits, no command following. Pt appears to respons to some stimuli, but again, does not respond purposefully, just reactionary. Pt with eyes open throughout, but no visual attention.   General Comments       Exercises Other Exercises Other Exercises: OT performs PROM of UEs shld, elbow and wrist in all functional planes at bed level x5 reps each, noting that L slightly more contracted with increased hypertonicity over R UE. In addition, pt only tolerates PROM to 1/2 range on L while he tolerates full PROM of L shoulder. Pt does react to attempts at L shld ROM including grimmacting to noxious stimuli. Other Exercises: OT elevated b/l UEs on two pillows each to increase elevation to heart level to assist in edem mgt. In addition, OT performs retrograde effleurage of all digits and hand to reduce edema for skin integrity. Lastly, OT applies resting hand splints bilaterally and educates RN re: wear time, skin assessment, and hygiene.   Shoulder Instructions      Home Living                                   Additional Comments: pt unable to answer questions at this time, no family available. Will attempt to f/u with family as able.      Prior Functioning/Environment                   OT Problem List: Decreased strength;Decreased range of motion;Decreased coordination;Decreased cognition;Impaired tone;Increased edema      OT Treatment/Interventions: Therapeutic activities;Therapeutic exercise;Manual therapy;Patient/family education;Splinting    OT Goals(Current goals can be found in the care plan section) Acute Rehab OT Goals Patient Stated Goal: none stated OT Goal  Formulation: Patient unable to participate in goal setting Time For Goal Achievement: 01/27/21 Potential to Achieve Goals: Fair ADL Goals Additional ADL Goal #1: Pt's care team will demo competency in b/l resting hand splint wear schedule and donning/doffing with no cues Additional ADL Goal #2: Pt's care team will demonstrate competency in positioning recommendations for b/l UE edema reduction with no verbal cues. Additional ADL Goal #3: Pt will experience at least 30% reduction of b/l UE edema through manual techniques as evidenced by no pitting in hands.  OT Frequency: Min 1X/week   Barriers to D/C:            Co-evaluation              AM-PAC OT "6  Clicks" Daily Activity     Outcome Measure Help from another person eating meals?: Total Help from another person taking care of personal grooming?: Total Help from another person toileting, which includes using toliet, bedpan, or urinal?: Total Help from another person bathing (including washing, rinsing, drying)?: Total Help from another person to put on and taking off regular upper body clothing?: Total Help from another person to put on and taking off regular lower body clothing?: Total 6 Click Score: 6   End of Session Nurse Communication: Mobility status  Activity Tolerance: Treatment limited secondary to medical complications (Comment) Patient left: in bed;with bed alarm set;with nursing/sitter in room  OT Visit Diagnosis: Other symptoms and signs involving the nervous system (R29.898);Other symptoms and signs involving cognitive function                Time: 1003-1020 OT Time Calculation (min): 17 min Charges:  OT General Charges $OT Visit: 1 Visit OT Evaluation $OT Eval High Complexity: 1 High OT Treatments $Orthotics Fit/Training: 8-22 mins  Rejeana Brock, MS, OTR/L ascom [phone] 01/13/21, 4:33 PM

## 2021-01-13 NOTE — Progress Notes (Signed)
NAME:  Jeremy Browning, MRN:  [id number], DOB:  1960/09/07, LOS: 19 ADMISSION DATE:  12/25/2020 60 yo M presenting to [facility] ED from home via EMS with respiratory distress that had been progressively worsening over the course of 12/25/20. Per documentation family stated the patient was in his normal state of health until this morning when he began feeling unwell. He then called out stating he could not breathe. EMS found the patient hypoxic and in distress with SpO2 in the 70's on room air. Patient was place on CPAP, arriving to the ED unresponsive and pulseless. ED course: Upon arrival to ED patient was defibrillated x 1 and received CPR before achieving ROSC. Initial EKG suggestive of global ischemia, second EKG had possible LBBB vs IVCD. Patient was emergently intubated requiring mechanical ventilation. Per ED documentation STEMI team activated in the setting of a witnessed shockable cardiac arrest with clinical exam highly suggestive of pulmonary edema with pink frothy sputum throughout ET tubing.  Pertinent Medical History  Obesity Hypertension Diabetes mellitus Chronic renal insufficiency Combined systolic and diastolic heart failure  Significant Hospital Events: Including procedures, antibiotic start and stop dates in addition to other pertinent events   Cardiac catheterization on presentation with normal coronaries, good cardiac output, elevated pulmonary capillary wedge pressure.  5/7 severe resp failure 5/8remains on Vent 5/9remains on vent , multiorgan failureMRI shows brain injury 5/10remains on vent, multiorgan failure, brother updated 5/11severe resp failure 5/30failed weaning trials due to severe brain damage 5/70failed weaning trials severe hypoxia dn vent dyssynchrony, sister updated 5/14vent support, severe brain damage, prognosis is poor 5/16unresponsive on wake up assessment, becomes tachypneic when sedation is lightened 5/17no purposeful movement on wake up  assessment, becomes tachypneic, asynchronous with vent 5/18remains critically ill, severe brain damage, patient made DNR 5/83family decided on TRACH/PEG tube 5/23severe brain damage, no further imaging needed 5/24 severe brain damage, PEG tube placed 5/25 [facility] PLACED by ENT 5/26 s/p trach PEG tube   MICRO DATA 5/7STAPHYLOCOCCUS HOMINIS1/4 cultures MRSA NEG 5/8 sputum cx: "normal flora"  COVID And INF A/B NEG   Antibiotics Given (last 72 hours)    Date/Time Action Medication Dose Rate   01/10/21 1125 New Bag/Given   cefTRIAXone (ROCEPHIN) 2 g in sodium chloride 0.9 % 100 mL IVPB 2 g 200 mL/hr   01/11/21 1121 New Bag/Given   cefTRIAXone (ROCEPHIN) 2 g in sodium chloride 0.9 % 100 mL IVPB 2 g 200 mL/hr   01/12/21 1222 New Bag/Given   cefTRIAXone (ROCEPHIN) 2 g in sodium chloride 0.9 % 100 mL IVPB 2 g 200 mL/hr          Interim History / Subjective:  Remains intubated Remains critically ill Severe brain damage Prognosis is very poor   Objective   Blood pressure (!) 155/59, pulse 76, temperature 100.2 F (37.9 C), temperature source Axillary, resp. rate 16, height 6' 0.76" (1.848 m), weight 112.1 kg, SpO2 100 %.    Vent Mode: PRVC FiO2 (%):  [28 %] 28 % Set Rate:  [16 bmp] 16 bmp Vt Set:  [550 mL] 550 mL PEEP:  [5 cmH20] 5 cmH20   Intake/Output Summary (Last 24 hours) at 01/13/2021 0746 Last data filed at 01/13/2021 0700 Gross per 24 hour  Intake 868.04 ml  Output 2405 ml  Net -1536.96 ml   Filed Weights   01/10/21 0311 01/11/21 0449 01/12/21 0348  Weight: 114.6 kg 113.5 kg 112.1 kg      REVIEW OF SYSTEMS  PATIENT IS UNABLE TO PROVIDE COMPLETE REVIEW  OF SYSTEMS DUE TO SEVERE CRITICAL ILLNESS AND TOXIC METABOLIC ENCEPHALOPATHY   PHYSICAL EXAMINATION:  GENERAL:critically ill appearing, +resp distress HEAD: Normocephalic, atraumatic.  EYES: Pupils equal, round, reactive to light.  No scleral icterus.  MOUTH: Moist mucosal membrane. NECK:  Supple.s/p trach PULMONARY: +rhonchi, +wheezing CARDIOVASCULAR: S1 and S2. Regular rate and rhythm. No murmurs, rubs, or gallops.  GASTROINTESTINAL: Soft, nontender, -distended. Positive bowel sounds. S/p PEG tube MUSCULOSKELETAL: No swelling, clubbing, or edema.  NEUROLOGIC: obtunded SKIN:intact,warm,dry   Labs/imaging that I havepersonally reviewed  (right click and "Reselect all SmartList Selections" daily)       ASSESSMENT AND PLAN SYNOPSIS   60 yo morbidly obese AAM with acute and severe cardiac arrest NSTEMI with acute severe systolic cardiac failure with signs and symptoms of severe anoxic encephalopathy. +PROTEUS PNEUMONIA  PATIENT IS NOT IMPROVING, PATIENT WITH MULTIORGAN FAILURE AND VERY POOR CHANCE OF MEANINGFUL RECOVERY  FAMILY HAS DECIDED ON AGGRESSIVE MEDICAL CARE WITH TRACH AND PEG TUBES  Patient will need artifical support permanently   Severe ACUTE Hypoxic and Hypercapnic Respiratory Failure -continue Mechanical Ventilator support -continue Bronchodilator Therapy -Wean Fio2 and PEEP as tolerated -VAP/VENT bundle implementation Plan to wean to PS mode  CARDIAC FAILURE-acute  Systolic dysfunction -oxygen as needed  CARDIAC ICU monitoring   ACUTE KIDNEY INJURY/Renal Failure -continue Foley Catheter-assess need -Avoid nephrotoxic agents -Follow urine output, BMP -Ensure adequate renal perfusion, optimize oxygenation -Renal dose medications   NEUROLOGY Acute toxic metabolic encephalopathy, need for sedation Goal RASS -2 to -3 Severe brain damage Will need artifical support to stay alive   INFECTIOUS DISEASE+PROTEUS PNEUMONIA -continue antibiotics as prescribed -follow up cultures -follow up ID consultation  ENDO - ICU hypoglycemic\Hyperglycemia protocol -check FSBS per protocol   GI GI PROPHYLAXIS as indicated  NUTRITIONAL STATUS DIET-->TF's as tolerated Constipation protocol as indicated   ELECTROLYTES -follow labs as  needed -replace as needed -pharmacy consultation and following    Best practice (right click and "Reselect all SmartList Selections" daily)  Diet:NPO Pain/Anxiety/Delirium protocol (if indicated):Yes(RASS goal -1,-2) VAP protocol (if indicated):Yes DVT prophylaxis:ON hold GI prophylaxis:H2B Glucose control:SSIYes Central venous access:Yes, and it is still needed Foley:Yes, and it is still needed Mobility:bed rest PT consulted:No DNR STATUS Disposition:ICU    Labs   CBC: Recent Labs  Lab 01/07/21 0440 01/08/21 0448 01/09/21 0509 01/10/21 0433 01/11/21 0436 01/12/21 0401 01/13/21 0515  WBC 10.8* 10.9* 9.6 9.3 8.4 8.6 7.9  NEUTROABS 6.1 7.4 5.4  --   --  5.0 4.5  HGB 11.3* 10.5* 10.2* 9.9* 10.1* 10.9* 10.3*  HCT 37.4* 34.8* 32.8* 33.4* 34.2* 35.4* 34.3*  MCV 81.5 81.7 81.0 83.1 84.0 82.5 82.1  PLT 560* 490* 464* 437* 418* 397 310    Basic Metabolic Panel: Recent Labs  Lab 01/09/21 0509 01/10/21 0433 01/11/21 0436 01/12/21 0401 01/12/21 1023 01/13/21 0515  NA 147* 151* 151* 151*  --  151*  K 5.1 5.0 5.1 5.4* 5.0 5.0  CL 120* 124* 122* 122*  --  122*  CO2 20* 21* 22 21*  --  21*  GLUCOSE 142* 180* 133* 144*  --  151*  BUN 54* 60* 59* 68*  --  62*  CREATININE 1.79* 1.53* 1.42* 1.63*  --  1.76*  CALCIUM 8.8* 9.0 9.1 9.1  --  9.0  MG 2.5* 2.6* 2.6* 2.7*  --  2.8*  PHOS 5.0* 4.7* 4.5 4.8*  --  4.8*   GFR: Estimated Creatinine Clearance: 58.3 mL/min (A) (by C-G formula based on SCr of 1.76  mg/dL (H)). Recent Labs  Lab 01/10/21 0433 01/11/21 0436 01/12/21 0401 01/13/21 0515  WBC 9.3 8.4 8.6 7.9    Liver Function Tests: No results for input(s): AST, ALT, ALKPHOS, BILITOT, PROT, ALBUMIN in the last 168 hours. No results for input(s): LIPASE, AMYLASE in the last 168 hours. Recent Labs  Lab 01/07/21 0912  AMMONIA 22    ABG    Component Value Date/Time   PHART 7.37 12/29/2020 1032   PCO2ART 35 12/29/2020 1032   PO2ART 81 (L)  12/29/2020 1032   HCO3 20.2 12/29/2020 1032   ACIDBASEDEF 4.5 (H) 12/29/2020 1032   O2SAT 95.5 12/29/2020 1032     Coagulation Profile: No results for input(s): INR, PROTIME in the last 168 hours.  Cardiac Enzymes: No results for input(s): CKTOTAL, CKMB, CKMBINDEX, TROPONINI in the last 168 hours.  HbA1C: Hgb A1c MFr Bld  Date/Time Value Ref Range Status  12/25/2020 04:46 PM 5.9 (H) 4.8 - 5.6 % Final    Comment:    (NOTE) Pre diabetes:          5.7%-6.4%  Diabetes:              >6.4%  Glycemic control for   <7.0% adults with diabetes     CBG: Recent Labs  Lab 01/12/21 1521 01/12/21 1918 01/12/21 2324 01/13/21 0350 01/13/21 0722  GLUCAP 110* 124* 117* 133* 120*    Allergies Not on File     DVT/GI PRX  assessed I Assessed the need for Labs I Assessed the need for Foley I Assessed the need for Central Venous Line Family Discussion when available I Assessed the need for Mobilization I made an Assessment of medications to be adjusted accordingly Safety Risk assessment completed  CASE DISCUSSED IN MULTIDISCIPLINARY ROUNDS WITH ICU TEAM     Critical Care Time devoted to patient care services described in this note is 60 minutes.  Critical care was necessary to treat or prevent imminent or life-threatening deterioration. Overall, patient is critically ill, prognosis is guarded.  Patient with Multiorgan failure and at high risk for cardiac arrest and death.    Lucie Leather, M.D.  [facility]  Medical Director [facility] Medical Director [facility] Cardio-Pulmonary Department

## 2021-01-13 NOTE — Plan of Care (Signed)
Pt without any significant events during shift. Pt remains on ventilator, frequent suctioning  needed, trach remains secure/ in place. Handoff report given to [facility]. Relinquishing care.   Problem: Clinical Measurements: Goal: Ability to maintain clinical measurements within normal limits will improve Outcome: Progressing Goal: Will remain free from infection Outcome: Progressing Goal: Diagnostic test results will improve Outcome: Progressing Goal: Respiratory complications will improve Outcome: Progressing Goal: Cardiovascular complication will be avoided Outcome: Progressing   Problem: Nutrition: Goal: Adequate nutrition will be maintained Outcome: Progressing   Problem: Elimination: Goal: Will not experience complications related to bowel motility Outcome: Progressing Goal: Will not experience complications related to urinary retention Outcome: Progressing   Problem: Pain Managment: Goal: General experience of comfort will improve Outcome: Progressing   Problem: Safety: Goal: Ability to remain free from injury will improve Outcome: Progressing   Problem: Skin Integrity: Goal: Risk for impaired skin integrity will decrease Outcome: Progressing   Problem: Activity: Goal: Ability to tolerate increased activity will improve Outcome: Progressing   Problem: Respiratory: Goal: Ability to maintain a clear airway and adequate ventilation will improve Outcome: Progressing   Problem: Role Relationship: Goal: Method of communication will improve Outcome: Progressing

## 2021-01-13 NOTE — Progress Notes (Signed)
The patient had his PEG tube placed and there has been no problems and the patient is tolerating PEG feedings. Nothing further to do from a GI point of view.  I will sign off.  Please call if any further GI concerns or questions.  We would like to thank you for the opportunity to participate in the care of Jeremy Browning.

## 2021-01-13 NOTE — Progress Notes (Signed)
GOALS OF CARE DISCUSSION  The Clinical status was relayed to family in detail. Marylene Land the sister of the patient   Updated and notified of patients medical condition.    Patient remains unresponsive and will not open eyes to command.   Patient is having a weak cough and struggling to remove secretions.   Patient with increased WOB and using accessory muscles to breathe Explained to family course of therapy and the modalities    Patient with Progressive multiorgan failure with a very high probablity of a very minimal chance of meaningful recovery despite all aggressive and optimal medical therapy.    Family understands the situation. Pain control Wean to TCT Wean off IV infusions  REMAINS DNR STATUS   Family are satisfied with Plan of action and management. All questions answered  Additional CC time 62 mins   Keifer Habib Santiago Glad, M.D.  [facility]  Medical Director [facility] Medical Director [facility] Cardio-Pulmonary Department

## 2021-01-14 ENCOUNTER — Encounter: Payer: Self-pay | Admitting: Pulmonary Disease

## 2021-01-14 LAB — CBC WITH DIFFERENTIAL/PLATELET
Abs Immature Granulocytes: 0.02 10*3/uL (ref 0.00–0.07)
Basophils Absolute: 0 10*3/uL (ref 0.0–0.1)
Basophils Relative: 0 %
Eosinophils Absolute: 0.4 10*3/uL (ref 0.0–0.5)
Eosinophils Relative: 5 %
HCT: 37.6 % — ABNORMAL LOW (ref 39.0–52.0)
Hemoglobin: 11.2 g/dL — ABNORMAL LOW (ref 13.0–17.0)
Immature Granulocytes: 0 %
Lymphocytes Relative: 27 %
Lymphs Abs: 1.9 10*3/uL (ref 0.7–4.0)
MCH: 24.6 pg — ABNORMAL LOW (ref 26.0–34.0)
MCHC: 29.8 g/dL — ABNORMAL LOW (ref 30.0–36.0)
MCV: 82.6 fL (ref 80.0–100.0)
Monocytes Absolute: 0.8 10*3/uL (ref 0.1–1.0)
Monocytes Relative: 11 %
Neutro Abs: 3.9 10*3/uL (ref 1.7–7.7)
Neutrophils Relative %: 57 %
Platelets: 317 10*3/uL (ref 150–400)
RBC: 4.55 MIL/uL (ref 4.22–5.81)
RDW: 16.5 % — ABNORMAL HIGH (ref 11.5–15.5)
WBC: 7 10*3/uL (ref 4.0–10.5)
nRBC: 0 % (ref 0.0–0.2)

## 2021-01-14 LAB — BASIC METABOLIC PANEL
Anion gap: 8 (ref 5–15)
BUN: 66 mg/dL — ABNORMAL HIGH (ref 6–20)
CO2: 22 mmol/L (ref 22–32)
Calcium: 9.6 mg/dL (ref 8.9–10.3)
Chloride: 120 mmol/L — ABNORMAL HIGH (ref 98–111)
Creatinine, Ser: 1.68 mg/dL — ABNORMAL HIGH (ref 0.61–1.24)
GFR, Estimated: 46 mL/min — ABNORMAL LOW (ref >60)
Glucose, Bld: 174 mg/dL — ABNORMAL HIGH (ref 70–99)
Potassium: 4.2 mmol/L (ref 3.5–5.1)
Sodium: 150 mmol/L — ABNORMAL HIGH (ref 135–145)

## 2021-01-14 LAB — GLUCOSE, CAPILLARY
Glucose-Capillary: 134 mg/dL — ABNORMAL HIGH (ref 70–99)
Glucose-Capillary: 142 mg/dL — ABNORMAL HIGH (ref 70–99)
Glucose-Capillary: 148 mg/dL — ABNORMAL HIGH (ref 70–99)
Glucose-Capillary: 175 mg/dL — ABNORMAL HIGH (ref 70–99)
Glucose-Capillary: 177 mg/dL — ABNORMAL HIGH (ref 70–99)
Glucose-Capillary: 182 mg/dL — ABNORMAL HIGH (ref 70–99)

## 2021-01-14 LAB — PHOSPHORUS: Phosphorus: 4.4 mg/dL (ref 2.5–4.6)

## 2021-01-14 LAB — MAGNESIUM: Magnesium: 2.6 mg/dL — ABNORMAL HIGH (ref 1.7–2.4)

## 2021-01-14 MED ORDER — NEPRO/CARBSTEADY PO LIQD
1000.0000 mL | ORAL | Status: DC
  Administered 2021-01-14 – 2021-02-08 (×21): 1000 mL

## 2021-01-14 MED ORDER — PROSOURCE TF PO LIQD
45.0000 mL | Freq: Two times a day (BID) | ORAL | Status: DC
  Administered 2021-01-14 – 2021-02-08 (×50): 45 mL
  Filled 2021-01-14 (×51): qty 45

## 2021-01-14 MED ORDER — LACTATED RINGERS IV SOLN
INTRAVENOUS | Status: DC
  Administered 2021-01-18: 40 mL/h via INTRAVENOUS

## 2021-01-14 MED ORDER — OXYCODONE HCL 5 MG/5ML PO SOLN
5.0000 mg | Freq: Four times a day (QID) | ORAL | Status: DC
  Administered 2021-01-14 – 2021-01-19 (×21): 5 mg
  Filled 2021-01-14 (×21): qty 5

## 2021-01-14 MED ORDER — IPRATROPIUM-ALBUTEROL 0.5-2.5 (3) MG/3ML IN SOLN
3.0000 mL | Freq: Four times a day (QID) | RESPIRATORY_TRACT | Status: DC
  Administered 2021-01-15 – 2021-01-18 (×16): 3 mL via RESPIRATORY_TRACT
  Filled 2021-01-14 (×14): qty 3

## 2021-01-14 NOTE — Progress Notes (Addendum)
PHARMACY CONSULT NOTE  Pharmacy Consult for Electrolyte Monitoring and Replacement   Recent Labs: Potassium (mmol/L)  Date Value  01/14/2021 4.2   Magnesium (mg/dL)  Date Value  97/67/3419 2.6 (H)   Calcium (mg/dL)  Date Value  37/90/2409 9.6   Albumin (g/dL)  Date Value  73/53/2992 2.4 (L)   Phosphorus (mg/dL)  Date Value  42/68/3419 4.4   Sodium (mmol/L)  Date Value  01/14/2021 150 (H)    Assessment:  60 year old male with obesity, diabetes mellitus, hypertension, and combined diastolic and systolic heart failure who presented to the emergency department yesterday in respiratory distress admitted following witnessed cardiac arrest. Pt is S/P CPR. MRI with infarcts. PEG tube placed 5/24, tracheostomy 5/25. Remains on ventilator. Pharmacy consulted for electrolyte monitoring and replacement.  PROSource 90 mL TID Nepro 50 ml/hr Free water 250 mL q4h  Patient increasingly hypertensive after addition of 2/3 home antihypertensives. Concern for possible pain component. Addition of oxycodone 5 mg q6h in addition to previously ordered fentanyl q2h PRN.  Goal of Therapy:  Electrolytes WNL  Plan:   Electrolytes trending up.  Tube feeds changed to Nepro.  On Lokelma 10 mg daily. Potassium now trending WNL, but has been hovering around 5-5.5 the past few days. If continues to trend down or remains ~ 4.0, will discuss stopping Lokelma and following potassium trend.  Na 150 - free water flushes at 250 ml q4h.  Continue to follow along.  Pricilla Riffle, PharmD Clinical Pharmacist 01/14/2021 11:38 AM

## 2021-01-14 NOTE — Progress Notes (Signed)
Patient remains off vent in no distress. RR has been 22/23 since beginning of shift. BBS clear. No secretions. Trach secure. Vent on standby. On 28% ATC

## 2021-01-14 NOTE — Progress Notes (Signed)
Nutrition Follow Up Note   DOCUMENTATION CODES:   Obesity unspecified  INTERVENTION:   Change to Nepro 1.8 _0 /hr + Pro-Source 63m BID via tube  Free water flushes 2580mq4 hours   Regimen provides 2672kcal/day, 139g/day protein and 254775may free water   NUTRITION DIAGNOSIS:   Inadequate oral intake related to inability to eat (pt sedated and ventilated) as evidenced by NPO status.  GOAL:   Provide needs based on ASPEN/SCCM guidelines  -met with tube feeds   MONITOR:   Vent status,Labs,Weight trends,TF tolerance,Skin,I & O's  ASSESSMENT:   59 18o. black male with diabetes mellitus type II, hypertension and congestive heart failure who [facility] for cardiogenic shock and flash pulmonary edema  Pt s/p PEG tube 5/24  Pt s/p tracheostomy 5/25  Pt currently on ATC. Pt tolerating tube feeds at goal rate. RD will adjust estimated needs as pt is s/p trach and off ventilator. No pressure injuries noted. Per chart, pt is down 13lbs(5%) since admit. Pt down -11.5L on I & Os.   Medications reviewed and include: aspirin, colace, pepcid, lovenox, lasix, insulin, oxycodone, miralax, lokelma  Labs reviewed: Na 150(H), K 4.2 wnl, BUN 66(H), creat 1.68(H), P 4.4 wnl, Mg 2.6(H) cbgs- 142, 182, 134 x 24 hrs  MAP- >14m50m  UOP- [id number] Order:   Diet Order            Diet NPO time specified  Diet effective now                EDUCATION NEEDS:   No education needs have been identified at this time  Skin:  Skin Assessment: Reviewed RN Assessment  Last BM:  5/27- type 6  Height:   Ht Readings from Last 1 Encounters:  01/07/21 6' 0.76" (1.848 m)    Weight:   Wt Readings from Last 1 Encounters:  01/14/21 114.1 kg    BMI:  Body mass index is 33.41 kg/m.  Estimated Nutritional Needs:   Kcal:  2500-2800kcal/day  Protein:  125-140g/day  Fluid:  2.4-2.7L/day  CaseyKoleen DistanceRD, LDN Please refer to [facility] and/or RD  on-call/weekend/after hours pager

## 2021-01-14 NOTE — NC FL2 (Signed)
[place] LEVEL OF CARE SCREENING TOOL     IDENTIFICATION  Patient Name: Jeremy Browning Birthdate: 1961/03/20 Sex: male Admission Date (Current Location): 12/25/2020  [place] and [place] Number:  Selena Lesser [id number] Facility and Address:  [facility], [address] [place], [place] [address]      Provider Number: [id number]  Attending Physician Name and Address:  Ottie Glazier, MD  Relative Name and Phone Number:  Estelle, Skibicki (Sister) [phone], Venia Carbon (Brother) [phone], Yusuke, Beza (S-I-L)  [phone]    Current Level of Care: Hospital Recommended Level of Care: [place] (Comment) ([place]) Prior Approval Number:    Date Approved/Denied:   PASRR Number: Pending  Discharge Plan: Other (Comment) ([place])    Current Diagnoses: Patient Active Problem List   Diagnosis Date Noted  . Oropharyngeal dysphagia   . SOB (shortness of breath)   . Severe hypoxic-ischemic encephalopathy   . HFrEF (heart failure with reduced ejection fraction) ([place])   . Cardiogenic shock ([place]) 12/25/2020  . Cardiopulmonary arrest with successful resuscitation ([place]) 12/25/2020  . Acute kidney injury superimposed on CKD ([place]) 12/25/2020  . Transaminitis 12/25/2020  . Second degree heart block 12/25/2020  . Lactic acidosis 12/25/2020  . Aspiration pneumonia ([place]) 12/25/2020  . Cardiac arrest with successful resuscitation ([place])   . Flash pulmonary edema ([place])   . Acute respiratory failure with hypoxia and hypercapnia (HCC)     Orientation RESPIRATION BLADDER Height & Weight        Tracheostomy Incontinent Weight: 251 lb 8.7 oz (114.1 kg) Height:  6' 0.76" (184.8 cm)  BEHAVIORAL SYMPTOMS/MOOD NEUROLOGICAL BOWEL NUTRITION STATUS      Incontinent Feeding tube  AMBULATORY STATUS COMMUNICATION OF NEEDS Skin   Total Care Does not communicate Normal                        Personal Care Assistance Level of Assistance  Bathing,Feeding,Dressing,Total care Bathing Assistance: Maximum assistance Feeding assistance: Maximum assistance Dressing Assistance: Maximum assistance Total Care Assistance: Maximum assistance   Functional Limitations Info  Sight,Hearing,Speech Sight Info: Impaired Hearing Info: Impaired Speech Info: Impaired    SPECIAL CARE FACTORS FREQUENCY  PT (By licensed PT),OT (By licensed OT)     PT Frequency: 5X per week OT Frequency: 5X per week            Contractures Contractures Info: Not present    Additional Factors Info           Transition to Long Term Care       Current Medications (01/14/2021):  This is the current hospital active medication list Current Facility-Administered Medications  Medication Dose Route Frequency Provider Last Rate Last Admin  . 0.9 %  sodium chloride infusion  250 mL Intravenous Continuous Leonie Man, MD   Stopped at 01/05/21 0448  . 0.9 %  sodium chloride infusion  250 mL Intravenous PRN Leonie Man, MD      . acetaminophen (TYLENOL) 160 MG/5ML solution 650 mg  650 mg Per Tube Q4H PRN Flora Lipps, MD   650 mg at 01/09/21 0240  . albuterol (PROVENTIL) (2.5 MG/3ML) 0.083% nebulizer solution 2.5 mg  2.5 mg Nebulization Q4H PRN Rust-Chester, Britton L, NP      . aspirin chewable tablet 81 mg  81 mg Per Tube Daily Flora Lipps, MD   81 mg at 01/14/21 1046  . atorvastatin (LIPITOR) tablet 80 mg  80 mg Per  Tube Daily Flora Lipps, MD   80 mg at 01/14/21 1046  . carvedilol (COREG) tablet 12.5 mg  12.5 mg Per Tube BID WC Flora Lipps, MD   12.5 mg at 01/14/21 0800  . chlorhexidine gluconate (MEDLINE KIT) (PERIDEX) 0.12 % solution 15 mL  15 mL Mouth Rinse BID Rust-Chester, Britton L, NP   15 mL at 01/14/21 0801  . Chlorhexidine Gluconate Cloth 2 % PADS 6 each  6 each Topical Q0600 Leonie Man, MD   6 each at 01/14/21 [phone]  . docusate (COLACE) 50 MG/5ML liquid 100 mg  100 mg Per Tube  BID Mauri Brooklyn, MD   100 mg at 01/14/21 1046  . docusate (COLACE) 50 MG/5ML liquid 100 mg  100 mg Oral BID PRN Flora Lipps, MD      . enoxaparin (LOVENOX) injection 55 mg  0.5 mg/kg Subcutaneous Q24H Flora Lipps, MD   55 mg at 01/14/21 1246  . famotidine (PEPCID) 40 MG/5ML suspension 20 mg  20 mg Per Tube Daily Mauri Brooklyn, MD   20 mg at 01/14/21 1054  . feeding supplement (NEPRO CARB STEADY) liquid 1,000 mL  1,000 mL Per Tube Continuous Aleskerov, Fuad, MD      . feeding supplement (PROSource TF) liquid 45 mL  45 mL Per Tube BID Ottie Glazier, MD      . fentaNYL (SUBLIMAZE) injection 25-100 mcg  25-100 mcg Intravenous Q2H PRN Flora Lipps, MD   50 mcg at 01/14/21 1047  . free water 250 mL  250 mL Per Tube Q4H Flora Lipps, MD   250 mL at 01/14/21 1105  . furosemide (LASIX) tablet 80 mg  80 mg Per Tube Daily Flora Lipps, MD   80 mg at 01/14/21 1046  . glycopyrrolate (ROBINUL) injection 0.1 mg  0.1 mg Intravenous BID Flora Lipps, MD   0.1 mg at 01/14/21 1046  . hydrALAZINE (APRESOLINE) injection 10 mg  10 mg Intravenous Q4H PRN Minna Merritts, MD   10 mg at 01/14/21 1104  . insulin aspart (novoLOG) injection 0-15 Units  0-15 Units Subcutaneous Q4H Rust-Chester, Britton L, NP   2 Units at 01/14/21 1245  . MEDLINE mouth rinse  15 mL Mouth Rinse 10 times per day Rust-Chester, Britton L, NP   15 mL at 01/14/21 1245  . midazolam (VERSED) injection 2-4 mg  2-4 mg Intravenous Q1H PRN Flora Lipps, MD   3 mg at 01/05/21 0119  . oxyCODONE (ROXICODONE) 5 MG/5ML solution 5 mg  5 mg Per Tube Q6H Aleskerov, Fuad, MD   5 mg at 01/14/21 1245  . polyethylene glycol (MIRALAX / GLYCOLAX) packet 17 g  17 g Per Tube Daily Mauri Brooklyn, MD   17 g at 01/14/21 1046  . polyethylene glycol (MIRALAX / GLYCOLAX) packet 17 g  17 g Per Tube Daily PRN Flora Lipps, MD      . sodium chloride flush (NS) 0.9 % injection 10-40 mL  10-40 mL Intracatheter PRN Leonie Man, MD   10 mL at 01/02/21 1008  . sodium chloride  flush (NS) 0.9 % injection 10-40 mL  10-40 mL Intracatheter PRN Tyler Pita, MD      . sodium zirconium cyclosilicate (LOKELMA) packet 10 g  10 g Per Tube Daily Renda Rolls, RPH   10 g at 01/14/21 1046     Discharge Medications: Please see discharge summary for a list of discharge medications.  Relevant Imaging Results:  Relevant Lab Results:   Additional Information SS# [id number]  Ferlin Fairhurst, LCSWA

## 2021-01-14 NOTE — TOC Progression Note (Signed)
Transition of Care [facility]) - Progression Note    Patient Details  Name: Jeremy Browning MRN: [id number] Date of Birth: 10/10/1960  Transition of [facility]) CM/SW Contact  Marina Goodell Phone Number: [phone] 01/14/2021, 1:55 PM  Clinical Narrative:     CSW spoke with patient's sister-in-law Degan Hanser [phone] to request demographic information needed to assist with finding long tern care placement.  CSW and Ms. Bubeck spoke about placement process and possible timeline.  CSW also spoke about possible home care and what that might entail.  CSW suggested Ms. Broadfoot contact the patient's Medicaid caseworker to find out what supplies and care Medicare will cover if the family chose the patient to return home.  Ms. Domeier stated she would speak with the patient's family.    Long Term Care placement started.  Expected Discharge Plan: Skilled Nursing Facility Barriers to Discharge: Continued Medical Work up  Expected Discharge Plan and Services Expected Discharge Plan: Skilled Nursing Facility In-house Referral: Clinical Social Work   Post Acute Care Choice: Skilled Nursing Facility                                         Social Determinants of Health (SDOH) Interventions    Readmission Risk Interventions No flowsheet data found.

## 2021-01-14 NOTE — Progress Notes (Signed)
NAME:  Jeremy Browning, MRN:  [id number], DOB:  05-Mar-1961, LOS: 20 ADMISSION DATE:  12/25/2020 60 yo M presenting to [facility] ED from home via EMS with respiratory distress that had been progressively worsening over the course of 12/25/20. Per documentation family stated the patient was in his normal state of health until this morning when he began feeling unwell. He then called out stating he could not breathe. EMS found the patient hypoxic and in distress with SpO2 in the 70's on room air. Patient was place on CPAP, arriving to the ED unresponsive and pulseless. ED course: Upon arrival to ED patient was defibrillated x 1 and received CPR before achieving ROSC. Initial EKG suggestive of global ischemia, second EKG had possible LBBB vs IVCD. Patient was emergently intubated requiring mechanical ventilation. Per ED documentation STEMI team activated in the setting of a witnessed shockable cardiac arrest with clinical exam highly suggestive of pulmonary edema with pink frothy sputum throughout ET tubing.  Pertinent Medical History  Obesity Hypertension Diabetes mellitus Chronic renal insufficiency Combined systolic and diastolic heart failure  Significant Hospital Events: Including procedures, antibiotic start and stop dates in addition to other pertinent events   Cardiac catheterization on presentation with normal coronaries, good cardiac output, elevated pulmonary capillary wedge pressure.  5/7 severe resp failure 5/8remains on Vent 5/9remains on vent , multiorgan failureMRI shows brain injury 5/10remains on vent, multiorgan failure, brother updated 5/11severe resp failure 5/16failed weaning trials due to severe brain damage 5/69failed weaning trials severe hypoxia dn vent dyssynchrony, sister updated 5/14vent support, severe brain damage, prognosis is poor 5/16unresponsive on wake up assessment, becomes tachypneic when sedation is lightened 5/17no purposeful movement on wake up  assessment, becomes tachypneic, asynchronous with vent 5/18remains critically ill, severe brain damage, patient made DNR 5/72family decided on TRACH/PEG tube 5/23severe brain damage, no further imaging needed 5/24 severe brain damage, PEG tube placed 5/25 [facility] PLACED by ENT 5/26 s/p trach PEG tube 01/14/21- no changes today, awaitingj permanent placement  MICRO DATA 5/7STAPHYLOCOCCUS HOMINIS1/4 cultures MRSA NEG 5/8 sputum cx: "normal flora"  COVID And INF A/B NEG   Antibiotics Given (last 72 hours)    Date/Time Action Medication Dose Rate   01/11/21 1121 New Bag/Given   cefTRIAXone (ROCEPHIN) 2 g in sodium chloride 0.9 % 100 mL IVPB 2 g 200 mL/hr   01/12/21 1222 New Bag/Given   cefTRIAXone (ROCEPHIN) 2 g in sodium chloride 0.9 % 100 mL IVPB 2 g 200 mL/hr   01/13/21 1229 New Bag/Given   cefTRIAXone (ROCEPHIN) 2 g in sodium chloride 0.9 % 100 mL IVPB 2 g 200 mL/hr          Interim History / Subjective:  Remains intubated Remains critically ill Severe brain damage Prognosis is very poor   Objective   Blood pressure (!) 170/71, pulse 95, temperature 100 F (37.8 C), temperature source Axillary, resp. rate (!) 27, height 6' 0.76" (1.848 m), weight 114.1 kg, SpO2 95 %.    FiO2 (%):  [28 %] 28 %   Intake/Output Summary (Last 24 hours) at 01/14/2021 1030 Last data filed at 01/14/2021 0600 Gross per 24 hour  Intake 706 ml  Output 1640 ml  Net -934 ml   Filed Weights   01/11/21 0449 01/12/21 0348 01/14/21 0500  Weight: 113.5 kg 112.1 kg 114.1 kg      REVIEW OF SYSTEMS  PATIENT IS UNABLE TO PROVIDE COMPLETE REVIEW OF SYSTEMS DUE TO SEVERE CRITICAL ILLNESS AND TOXIC METABOLIC ENCEPHALOPATHY   PHYSICAL EXAMINATION:  GENERAL:critically ill appearing, +resp distress HEAD: Normocephalic, atraumatic.  EYES: Pupils equal, round, reactive to light.  No scleral icterus.  MOUTH: Moist mucosal membrane. NECK: Supple.s/p trach PULMONARY: +rhonchi,  +wheezing CARDIOVASCULAR: S1 and S2. Regular rate and rhythm. No murmurs, rubs, or gallops.  GASTROINTESTINAL: Soft, nontender, -distended. Positive bowel sounds. S/p PEG tube MUSCULOSKELETAL: No swelling, clubbing, or edema.  NEUROLOGIC: obtunded SKIN:intact,warm,dry   Labs/imaging that I havepersonally reviewed  (right click and "Reselect all SmartList Selections" daily)       ASSESSMENT AND PLAN SYNOPSIS   60 yo morbidly obese AAM with acute and severe cardiac arrest NSTEMI with acute severe systolic cardiac failure with signs and symptoms of severe anoxic encephalopathy. +PROTEUS PNEUMONIA  PATIENT IS NOT IMPROVING, PATIENT WITH MULTIORGAN FAILURE AND VERY POOR CHANCE OF MEANINGFUL RECOVERY   Anoxic brain injury  -Subacute infarcts - per MRI brain  -s/p Neuro eval- A/R: 60 year old male status-post cardiac arrest - Given EEG which is not flatline or very low voltage, the patient's long-term neurological prognosis is likely to be better than originally anticipated.  - The cortical restricted diffusion seen on prior MRI may therefore also be reversible. This can be reassessed with a follow up MRI brain. If the patient is stable for MRI, would obtain today or tomorrow.  - Would attempt to wean off all sedation after repeat MRI is obtained.   Electronically signed: Dr. Caryl Pina   Severe ACUTE Hypoxic and Hypercapnic Respiratory Failure - due to pneumonia  -s/p trache and PEG   CARDIAC FAILURE-acute  - Systolic dysfunction per TTE 12/27/20  CARDIAC ICU monitoring   ACUTE KIDNEY INJURY/Renal Failure - with pre-renal azotemia -IVF 50/h    INFECTIOUS DISEASE+PROTEUS PNEUMONIA -continue antibiotics as prescribed -follow up cultures -follow up ID consultation  ENDO - ICU hypoglycemic\Hyperglycemia protocol -check FSBS per protocol   GI GI PROPHYLAXIS as indicated  NUTRITIONAL STATUS DIET-->TF's as tolerated Constipation protocol as  indicated   ELECTROLYTES -follow labs as needed -replace as needed -pharmacy consultation and following    Best practice (right click and "Reselect all SmartList Selections" daily)  Diet:NPO Pain/Anxiety/Delirium protocol (if indicated):Yes(RASS goal -1,-2) VAP protocol (if indicated):Yes DVT prophylaxis:ON hold GI prophylaxis:H2B Glucose control:SSIYes Central venous access:Yes, and it is still needed Foley:Yes, and it is still needed Mobility:bed rest PT consulted:No DNR STATUS Disposition:ICU    Labs   CBC: Recent Labs  Lab 01/08/21 0448 01/09/21 0509 01/10/21 0433 01/11/21 0436 01/12/21 0401 01/13/21 0515 01/14/21 0430  WBC 10.9* 9.6 9.3 8.4 8.6 7.9 7.0  NEUTROABS 7.4 5.4  --   --  5.0 4.5 3.9  HGB 10.5* 10.2* 9.9* 10.1* 10.9* 10.3* 11.2*  HCT 34.8* 32.8* 33.4* 34.2* 35.4* 34.3* 37.6*  MCV 81.7 81.0 83.1 84.0 82.5 82.1 82.6  PLT 490* 464* 437* 418* 397 310 317    Basic Metabolic Panel: Recent Labs  Lab 01/10/21 0433 01/11/21 0436 01/12/21 0401 01/12/21 1023 01/13/21 0515 01/14/21 0430  NA 151* 151* 151*  --  151* 150*  K 5.0 5.1 5.4* 5.0 5.0 4.2  CL 124* 122* 122*  --  122* 120*  CO2 21* 22 21*  --  21* 22  GLUCOSE 180* 133* 144*  --  151* 174*  BUN 60* 59* 68*  --  62* 66*  CREATININE 1.53* 1.42* 1.63*  --  1.76* 1.68*  CALCIUM 9.0 9.1 9.1  --  9.0 9.6  MG 2.6* 2.6* 2.7*  --  2.8* 2.6*  PHOS 4.7* 4.5 4.8*  --  4.8* 4.4   GFR: Estimated Creatinine Clearance: 61.6 mL/min (A) (by C-G formula based on SCr of 1.68 mg/dL (H)). Recent Labs  Lab 01/11/21 0436 01/12/21 0401 01/13/21 0515 01/14/21 0430  WBC 8.4 8.6 7.9 7.0    Liver Function Tests: No results for input(s): AST, ALT, ALKPHOS, BILITOT, PROT, ALBUMIN in the last 168 hours. No results for input(s): LIPASE, AMYLASE in the last 168 hours. No results for input(s): AMMONIA in the last 168 hours.  ABG    Component Value Date/Time   PHART 7.37 12/29/2020 1032    PCO2ART 35 12/29/2020 1032   PO2ART 81 (L) 12/29/2020 1032   HCO3 20.2 12/29/2020 1032   ACIDBASEDEF 4.5 (H) 12/29/2020 1032   O2SAT 95.5 12/29/2020 1032     Coagulation Profile: No results for input(s): INR, PROTIME in the last 168 hours.  Cardiac Enzymes: No results for input(s): CKTOTAL, CKMB, CKMBINDEX, TROPONINI in the last 168 hours.  HbA1C: Hgb A1c MFr Bld  Date/Time Value Ref Range Status  12/25/2020 04:46 PM 5.9 (H) 4.8 - 5.6 % Final    Comment:    (NOTE) Pre diabetes:          5.7%-6.4%  Diabetes:              >6.4%  Glycemic control for   <7.0% adults with diabetes     CBG: Recent Labs  Lab 01/13/21 1517 01/13/21 1926 01/13/21 2316 01/14/21 0413 01/14/21 0728  GLUCAP 129* 128* 131* 142* 182*    Allergies Not on File     DVT/GI PRX  assessed I Assessed the need for Labs I Assessed the need for Foley I Assessed the need for Central Venous Line Family Discussion when available I Assessed the need for Mobilization I made an Assessment of medications to be adjusted accordingly Safety Risk assessment completed  CASE DISCUSSED IN MULTIDISCIPLINARY ROUNDS WITH ICU TEAM     Critical Care Time devoted to patient care services described in this note is 33 minutes.  Critical care was necessary to treat or prevent imminent or life-threatening deterioration. Overall, patient is critically ill, prognosis is guarded.  Patient with Multiorgan failure and at high risk for cardiac arrest and death.    Vida Rigger, M.D.  Pulmonary & Critical Care Medicine  [facility]

## 2021-01-14 NOTE — Progress Notes (Signed)
Patient noted to be in distress on trach collar. Decision was made to place back on vent.  Patient tolerating pressure support well. Nebs started due to wheezing. Patient with copious thick secretions from trach with suction. Will continue to monitor.

## 2021-01-15 LAB — CBC WITH DIFFERENTIAL/PLATELET
Abs Immature Granulocytes: 0.01 10*3/uL (ref 0.00–0.07)
Basophils Absolute: 0 10*3/uL (ref 0.0–0.1)
Basophils Relative: 1 %
Eosinophils Absolute: 0.4 10*3/uL (ref 0.0–0.5)
Eosinophils Relative: 5 %
HCT: 34.4 % — ABNORMAL LOW (ref 39.0–52.0)
Hemoglobin: 10.5 g/dL — ABNORMAL LOW (ref 13.0–17.0)
Immature Granulocytes: 0 %
Lymphocytes Relative: 30 %
Lymphs Abs: 2 10*3/uL (ref 0.7–4.0)
MCH: 25 pg — ABNORMAL LOW (ref 26.0–34.0)
MCHC: 30.5 g/dL (ref 30.0–36.0)
MCV: 81.9 fL (ref 80.0–100.0)
Monocytes Absolute: 0.9 10*3/uL (ref 0.1–1.0)
Monocytes Relative: 13 %
Neutro Abs: 3.4 10*3/uL (ref 1.7–7.7)
Neutrophils Relative %: 51 %
Platelets: 279 10*3/uL (ref 150–400)
RBC: 4.2 MIL/uL — ABNORMAL LOW (ref 4.22–5.81)
RDW: 16.9 % — ABNORMAL HIGH (ref 11.5–15.5)
WBC: 6.6 10*3/uL (ref 4.0–10.5)
nRBC: 0 % (ref 0.0–0.2)

## 2021-01-15 LAB — GLUCOSE, CAPILLARY
Glucose-Capillary: 151 mg/dL — ABNORMAL HIGH (ref 70–99)
Glucose-Capillary: 151 mg/dL — ABNORMAL HIGH (ref 70–99)
Glucose-Capillary: 158 mg/dL — ABNORMAL HIGH (ref 70–99)
Glucose-Capillary: 174 mg/dL — ABNORMAL HIGH (ref 70–99)
Glucose-Capillary: 182 mg/dL — ABNORMAL HIGH (ref 70–99)
Glucose-Capillary: 192 mg/dL — ABNORMAL HIGH (ref 70–99)

## 2021-01-15 LAB — BASIC METABOLIC PANEL
Anion gap: 10 (ref 5–15)
BUN: 67 mg/dL — ABNORMAL HIGH (ref 6–20)
CO2: 21 mmol/L — ABNORMAL LOW (ref 22–32)
Calcium: 9.1 mg/dL (ref 8.9–10.3)
Chloride: 116 mmol/L — ABNORMAL HIGH (ref 98–111)
Creatinine, Ser: 1.85 mg/dL — ABNORMAL HIGH (ref 0.61–1.24)
GFR, Estimated: 41 mL/min — ABNORMAL LOW (ref >60)
Glucose, Bld: 184 mg/dL — ABNORMAL HIGH (ref 70–99)
Potassium: 4 mmol/L (ref 3.5–5.1)
Sodium: 147 mmol/L — ABNORMAL HIGH (ref 135–145)

## 2021-01-15 LAB — PHOSPHORUS: Phosphorus: 4.2 mg/dL (ref 2.5–4.6)

## 2021-01-15 LAB — MAGNESIUM: Magnesium: 2.3 mg/dL (ref 1.7–2.4)

## 2021-01-15 NOTE — Progress Notes (Signed)
PHARMACY CONSULT NOTE  Pharmacy Consult for Electrolyte Monitoring and Replacement   Recent Labs: Potassium (mmol/L)  Date Value  01/15/2021 4.0   Magnesium (mg/dL)  Date Value  02/14/9484 2.3   Calcium (mg/dL)  Date Value  46/27/0350 9.1   Albumin (g/dL)  Date Value  09/38/1829 2.4 (L)   Phosphorus (mg/dL)  Date Value  93/71/6967 4.2   Sodium (mmol/L)  Date Value  01/15/2021 147 (H)    Assessment:  60 year old male with obesity, diabetes mellitus, hypertension, and combined diastolic and systolic heart failure who presented to the emergency department yesterday in respiratory distress admitted following witnessed cardiac arrest. Pt is S/P CPR. MRI with infarcts. PEG tube placed 5/24, tracheostomy 5/25. Remains on ventilator. Pharmacy consulted for electrolyte monitoring and replacement.  PROSource 90 mL TID >> 44ml bid Nepro 60 ml/hr Free water 250 mL q4h  Patient increasingly hypertensive after addition of 2/3 home antihypertensives. Concern for possible pain component. Addition of oxycodone 5 mg q6h in addition to previously ordered fentanyl q2h PRN.  Goal of Therapy:  Electrolytes WNL  Plan:  K 4.0  Mag 2.3  Phos 4.2  Scr 1.85 Na 147  On Lokelma 10 mg daily. Potassium now trending WNL, but has been hovering around 5-5.5 the past few days. If continues to trend down or remains ~ 4.0, will discuss stopping Lokelma and following potassium trend.  No electrolyte replacement at this time  - free water flushes at 250 ml q4h.  Continue to follow along.  Angelique Blonder, PharmD Clinical Pharmacist 01/15/2021 9:18 AM

## 2021-01-15 NOTE — Progress Notes (Signed)
Chaplain Maggie offered support to patient's brother and sister in law in ICU waiting room. Room was made for storytelling and empathetic listening. They expressed the challenges of making difficult decisions on behalf of the patient and requested prayer for the process of finding an appropriate facility for the patient to move to after discharge. Continued support available through on call Chaplain.

## 2021-01-15 NOTE — Plan of Care (Signed)
Pt without any significant event to note during shift. VSS, pain management effective. Q2 oral care performed.  Problem: Clinical Measurements: Goal: Ability to maintain clinical measurements within normal limits will improve Outcome: Progressing Goal: Will remain free from infection Outcome: Progressing Goal: Diagnostic test results will improve Outcome: Progressing Goal: Respiratory complications will improve Outcome: Progressing Goal: Cardiovascular complication will be avoided Outcome: Progressing   Problem: Nutrition: Goal: Adequate nutrition will be maintained Outcome: Progressing   Problem: Elimination: Goal: Will not experience complications related to bowel motility Outcome: Progressing Goal: Will not experience complications related to urinary retention Outcome: Progressing   Problem: Pain Managment: Goal: General experience of comfort will improve Outcome: Progressing   Problem: Safety: Goal: Ability to remain free from injury will improve Outcome: Progressing   Problem: Skin Integrity: Goal: Risk for impaired skin integrity will decrease Outcome: Progressing   Problem: Activity: Goal: Ability to tolerate increased activity will improve Outcome: Progressing   Problem: Respiratory: Goal: Ability to maintain a clear airway and adequate ventilation will improve Outcome: Progressing   Problem: Role Relationship: Goal: Method of communication will improve Outcome: Progressing

## 2021-01-16 DIAGNOSIS — J9601 Acute respiratory failure with hypoxia: Secondary | ICD-10-CM | POA: Diagnosis not present

## 2021-01-16 DIAGNOSIS — I420 Dilated cardiomyopathy: Secondary | ICD-10-CM

## 2021-01-16 DIAGNOSIS — J810 Acute pulmonary edema: Secondary | ICD-10-CM | POA: Diagnosis not present

## 2021-01-16 DIAGNOSIS — N179 Acute kidney failure, unspecified: Secondary | ICD-10-CM | POA: Diagnosis not present

## 2021-01-16 DIAGNOSIS — I469 Cardiac arrest, cause unspecified: Secondary | ICD-10-CM | POA: Diagnosis not present

## 2021-01-16 LAB — PHOSPHORUS: Phosphorus: 5 mg/dL — ABNORMAL HIGH (ref 2.5–4.6)

## 2021-01-16 LAB — BASIC METABOLIC PANEL
Anion gap: 8 (ref 5–15)
BUN: 66 mg/dL — ABNORMAL HIGH (ref 6–20)
CO2: 23 mmol/L (ref 22–32)
Calcium: 9 mg/dL (ref 8.9–10.3)
Chloride: 118 mmol/L — ABNORMAL HIGH (ref 98–111)
Creatinine, Ser: 1.74 mg/dL — ABNORMAL HIGH (ref 0.61–1.24)
GFR, Estimated: 44 mL/min — ABNORMAL LOW (ref >60)
Glucose, Bld: 131 mg/dL — ABNORMAL HIGH (ref 70–99)
Potassium: 4.1 mmol/L (ref 3.5–5.1)
Sodium: 149 mmol/L — ABNORMAL HIGH (ref 135–145)

## 2021-01-16 LAB — GLUCOSE, CAPILLARY
Glucose-Capillary: 110 mg/dL — ABNORMAL HIGH (ref 70–99)
Glucose-Capillary: 119 mg/dL — ABNORMAL HIGH (ref 70–99)
Glucose-Capillary: 125 mg/dL — ABNORMAL HIGH (ref 70–99)
Glucose-Capillary: 126 mg/dL — ABNORMAL HIGH (ref 70–99)
Glucose-Capillary: 158 mg/dL — ABNORMAL HIGH (ref 70–99)
Glucose-Capillary: 167 mg/dL — ABNORMAL HIGH (ref 70–99)

## 2021-01-16 LAB — CBC
HCT: 32.6 % — ABNORMAL LOW (ref 39.0–52.0)
Hemoglobin: 9.9 g/dL — ABNORMAL LOW (ref 13.0–17.0)
MCH: 24.8 pg — ABNORMAL LOW (ref 26.0–34.0)
MCHC: 30.4 g/dL (ref 30.0–36.0)
MCV: 81.7 fL (ref 80.0–100.0)
Platelets: 235 10*3/uL (ref 150–400)
RBC: 3.99 MIL/uL — ABNORMAL LOW (ref 4.22–5.81)
RDW: 16.6 % — ABNORMAL HIGH (ref 11.5–15.5)
WBC: 7.1 10*3/uL (ref 4.0–10.5)
nRBC: 0 % (ref 0.0–0.2)

## 2021-01-16 LAB — MAGNESIUM: Magnesium: 2.5 mg/dL — ABNORMAL HIGH (ref 1.7–2.4)

## 2021-01-16 MED ORDER — HYDRALAZINE HCL 50 MG PO TABS
25.0000 mg | ORAL_TABLET | Freq: Four times a day (QID) | ORAL | Status: DC
  Administered 2021-01-16 – 2021-01-17 (×2): 25 mg via ORAL
  Filled 2021-01-16 (×2): qty 1

## 2021-01-16 NOTE — Progress Notes (Signed)
Transitioned patient to trach collar as secretions are better. Trach still oozing from secretions. Cleansed area. Inner cannula changed. Patient tolerated interventions well

## 2021-01-16 NOTE — Progress Notes (Signed)
Progress Note  Patient Name: Jeremy Browning Date of Encounter: 01/16/2021  Primary Cardiologist: None - seen by Ellyn Hack on admission  Subjective   Asked to f/u on Mr. Brayboy in setting of some improvement in neuro status and concern for arrhythmia overnight.  Pertinent events since we last saw him:  5/7 severe resp failure. Cath w/ nl cors 5/8remains on Vent 5/9remains on vent , multiorgan failureMRI shows brain injury. Echo w/ EF 45-50%. 5/10remains on vent, multiorgan failure, brother updated 5/11severe resp failure 5/35filed weaning trials due to severe brain damage 5/173fled weaning trials severe hypoxia dn vent dyssynchrony, sister updated 5/14vent support, severe brain damage, prognosis is poor 5/16unresponsive on wake up assessment, becomes tachypneic when sedation is lightened 5/17no purposeful movement on wake up assessment, becomes tachypneic, asynchronous with vent 5/18remains critically ill, severe brain damage, patient made DNR. EEG low voltage but not flatline - neuro notes long-term prognosis likely better than originally anticpated w/ rec for f/u MRI when pt stable. 5/2043fly decided on TRACH/PEG tube 5/23severe brain damage, no further imaging needed 5/24severe brain damage, PEG tube placed [facility] s/p trach PEG tube  Pt opens eyes to name.  Does not follow commands.  Remains trach'd/mechanically ventilated.   Alarms and all tele reviewed - no significant arrhythmias noted.    Inpatient Medications    Scheduled Meds: . aspirin  81 mg Per Tube Daily  . atorvastatin  80 mg Per Tube Daily  . carvedilol  12.5 mg Per Tube BID WC  . chlorhexidine gluconate (MEDLINE KIT)  15 mL Mouth Rinse BID  . Chlorhexidine Gluconate Cloth  6 each Topical Q0600  . docusate  100 mg Per Tube BID  . enoxaparin (LOVENOX) injection  0.5 mg/kg Subcutaneous Q24H  . famotidine  20 mg Per Tube Daily  . feeding supplement (PROSource TF)  45 mL Per  Tube BID  . free water  250 mL Per Tube Q4H  . glycopyrrolate  0.1 mg Intravenous BID  . insulin aspart  0-15 Units Subcutaneous Q4H  . ipratropium-albuterol  3 mL Nebulization Q6H  . mouth rinse  15 mL Mouth Rinse 10 times per day  . oxyCODONE  5 mg Per Tube Q6H  . polyethylene glycol  17 g Per Tube Daily  . sodium zirconium cyclosilicate  10 g Per Tube Daily   Continuous Infusions: . sodium chloride Stopped (01/05/21 0448)  . sodium chloride    . feeding supplement (NEPRO CARB STEADY) 60 mL/hr at 01/16/21 0630  . lactated ringers 40 mL/hr at 01/16/21 0630   PRN Meds: sodium chloride, acetaminophen (TYLENOL) oral liquid 160 mg/5 mL, albuterol, docusate, fentaNYL (SUBLIMAZE) injection, hydrALAZINE, midazolam, polyethylene glycol, sodium chloride flush, sodium chloride flush   Vital Signs    Vitals:   01/16/21 0700 01/16/21 0734 01/16/21 0800 01/16/21 0900  BP: (!) 164/45  (!) 148/40 (!) 150/44  Pulse: 71  77 73  Resp: (!) _0 Temp:    98.2 F (36.8 C)  TempSrc:    Axillary  SpO2: 100% 100% 100% 99%  Weight:      Height:        Intake/Output Summary (Last 24 hours) at 01/16/2021 0942 Last data filed at 01/16/2021 0630 Gross per 24 hour  Intake 1695.82 ml  Output 1400 ml  Net 295.82 ml   Filed Weights   01/11/21 0449 01/12/21 0348 01/14/21 0500  Weight: 113.5 kg 112.1 kg 114.1 kg    Physical Exam  GEN: Obese. Mechanically ventilated via trach.  No acute distress.  HEENT: Grossly normal.  Neck: Supple, no JVD, carotid bruits, or masses. Cardiac: RRR, no murmurs, rubs, or gallops. No clubbing, cyanosis, edema.  Radials 2+, DP/PT 1+ and equal bilaterally.  Respiratory:  Respirations regular and unlabored, ventilated breath sounds/rhonchi. GI: Obese, soft, nontender, nondistended, BS + x 4. PEG LUQ. MS: no deformity or atrophy. Skin: warm and dry, no rash. Neuro:  Opens eyes to name.  No purposeful mvmt. Does not follow commands. Psych: Flat affect.  Labs     Chemistry Recent Labs  Lab 01/14/21 0430 01/15/21 0520 01/16/21 0458  NA 150* 147* 149*  K 4.2 4.0 4.1  CL 120* 116* 118*  CO2 22 21* 23  GLUCOSE 174* 184* 131*  BUN 66* 67* 66*  CREATININE 1.68* 1.85* 1.74*  CALCIUM 9.6 9.1 9.0  GFRNONAA 46* 41* 44*  ANIONGAP _0 Hematology Recent Labs  Lab 01/14/21 0430 01/15/21 0520 01/16/21 0458  WBC 7.0 6.6 7.1  RBC 4.55 4.20* 3.99*  HGB 11.2* 10.5* 9.9*  HCT 37.6* 34.4* 32.6*  MCV 82.6 81.9 81.7  MCH 24.6* 25.0* 24.8*  MCHC 29.8* 30.5 30.4  RDW 16.5* 16.9* 16.6*  PLT 317 279 235    Cardiac Enzymes  Recent Labs  Lab 12/25/20 1451 12/25/20 1651  TROPONINIHS 310* 2,479*      Lipids  Lab Results  Component Value Date   CHOL 149 12/26/2020   HDL 36 (L) 12/26/2020   LDLCALC 95 12/26/2020   TRIG 138 01/12/2021   CHOLHDL 4.1 12/26/2020    HbA1c  Lab Results  Component Value Date   HGBA1C 5.9 (H) 12/25/2020    Radiology    No results found.  Telemetry    RSR, LBBB, no significant arrhythmias, some artifact - Personally Reviewed  ECG    5/29 @ 0837: RSR, 76, RAD, LBBB, no acute ST/T changes - Personally Reviewed  Cardiac Studies   Echo 12/27/2020 1. Left ventricular ejection fraction, by estimation, is 45 to 50%. The  left ventricle has mildly decreased function. The left ventricle  demonstrates global hypokinesis. The left ventricular internal cavity size  was mildly to moderately dilated. There  is mild left ventricular hypertrophy. Left ventricular diastolic  parameters are consistent with Grade I diastolic dysfunction (impaired  relaxation).  2. Right ventricular systolic function is normal. The right ventricular  size is normal.  3. Left atrial size was mildly dilated.   [facility] 12/25/2020 POST-OPERATIVE DIAGNOSIS:  Angiographically normal coronary arteries with no obvious culprit lesion to explain heart failure this level of hypoxia or hypercapnia.  Acute combined systolic and  diastolic heart failure: EF of roughly 40 to 45% with global hypokinesis: PCWP and LVEDP of 30 mmHg. ? Cardiac Output and Index relatively normal: CO 6.54, CI 2.82 (mildly reduced). ? AoP-MAP 97/59 mmHg - 71 mmHg; LVP-EDP 101/21-30 mmHg.;PCWP 30 mmHg, PAP 47/17 mmHg-mean 33 mmHg. RAP mean 22 mmHg. ? Secondary Pulmonary Hypertension   Patient Profile     60 y.o. male with history of diabetes presenting with hypercapnic respiratory failure,.  Diagnosed with NSTEMI, underwent left heart cath showing no angiographically evidence of CAD.  Subsequent work-up via MRI brain revealing right cerebellum and cerebral hemispheric infarct.  5/7 severe resp failure. Cath w/ nl cors 5/8remains on Vent 5/9remains on vent , multiorgan failureMRI shows brain injury. Echo w/ EF 45-50%. 5/10remains on vent, multiorgan failure, brother updated 5/11severe resp failure 5/16filed weaning trials due  to severe brain damage 5/70filed weaning trials severe hypoxia dn vent dyssynchrony, sister updated 5/14vent support, severe brain damage, prognosis is poor 5/16unresponsive on wake up assessment, becomes tachypneic when sedation is lightened 5/17no purposeful movement on wake up assessment, becomes tachypneic, asynchronous with vent 5/18remains critically ill, severe brain damage, patient made DNR. EEG low voltage but not flatline - neuro notes long-term prognosis likely better than originally anticpated w/ rec for f/u MRI when pt stable. 5/234fily decided on TRACH/PEG tube 5/23severe brain damage, no further imaging needed 5/24severe brain damage, PEG tube placed [facility] s/p trach PEG tube  Assessment & Plan    1.  Acute hypoxic resp failure/Flash Pulm Edema: Developed dyspnea and resp failure @ home.  Initially hypertensive and concern for pulm edema by CXR.  Required intubation. Developed VT req defib and brief CPR. ECG w/ evidence of global ischemia and subsequent ECG  w/ LBBB.  Cath 5/7 w/ nl cors.  Echo 5/9 w/ EF 45-50%.  Pt remains intubated via trach.  Opens eyes but does not follow commands.  All available tele alarms and tele trends reviewed - no significant arrhythmias noted. Vent mgmt per CCM.    2.  Acute combined syst/diast CHF/Flash pulm edema:  Nl cors. EF 45-50%. Wt down 13 kg since admission.  Body habitus and positioning make exam challenging, but appears euvolemic. Minus 11L since admission. Cont  blocker. No acei/arb/arni/mra 2/2 CKD III.  BP trending up, consider titrating  blocker.  3.  NSTEMI//VT arrest/AV block:  Nl cors on cath.  No significant arrhythmias on tele.  Cont asa, statin,  blocker.  4.  CKD III:  Creat has been relatively stable.  5.  Essential HTN:  BPs trending 140's to 160's.  Consider titrating  blocker.  Defer to CCM/Neuro in setting of stroke.  6. Anoxic brain injury:  Seen by neuro.  Prognosis better than originally expected based on 5/18 EEG.   Signed, ChMurray HodgkinsNP  01/16/2021, 9:42 AM    For questions or updates, please contact   Please consult [email or web address] for contact info under Cardiology/STEMI.

## 2021-01-16 NOTE — Progress Notes (Signed)
NAME:  Jeremy Browning, MRN:  [id number], DOB:  1961-02-03, LOS: 22 ADMISSION DATE:  12/25/2020 60 yo M presenting to [facility] ED from home via EMS with respiratory distress that had been progressively worsening over the course of 12/25/20. Per documentation family stated the patient was in his normal state of health until this morning when he began feeling unwell. He then called out stating he could not breathe. EMS found the patient hypoxic and in distress with SpO2 in the 70's on room air. Patient was place on CPAP, arriving to the ED unresponsive and pulseless. ED course: Upon arrival to ED patient was defibrillated x 1 and received CPR before achieving ROSC. Initial EKG suggestive of global ischemia, second EKG had possible LBBB vs IVCD. Patient was emergently intubated requiring mechanical ventilation. Per ED documentation STEMI team activated in the setting of a witnessed shockable cardiac arrest with clinical exam highly suggestive of pulmonary edema with pink frothy sputum throughout ET tubing.  Pertinent Medical History  Obesity Hypertension Diabetes mellitus Chronic renal insufficiency Combined systolic and diastolic heart failure  Significant Hospital Events: Including procedures, antibiotic start and stop dates in addition to other pertinent events   Cardiac catheterization on presentation with normal coronaries, good cardiac output, elevated pulmonary capillary wedge pressure.  5/7 severe resp failure 5/8remains on Vent 5/9remains on vent , multiorgan failureMRI shows brain injury 5/10remains on vent, multiorgan failure, brother updated 5/11severe resp failure 5/62failed weaning trials due to severe brain damage 5/67failed weaning trials severe hypoxia dn vent dyssynchrony, sister updated 5/14vent support, severe brain damage, prognosis is poor 5/16unresponsive on wake up assessment, becomes tachypneic when sedation is lightened 5/17no purposeful movement on wake up  assessment, becomes tachypneic, asynchronous with vent 5/18remains critically ill, severe brain damage, patient made DNR 5/66family decided on TRACH/PEG tube 5/23severe brain damage, no further imaging needed 5/24 severe brain damage, PEG tube placed 5/25 [facility] PLACED by ENT 5/26 s/p trach PEG tube 01/14/21- no changes today, awaitingj permanent placement  01/16/21- patient is stable without changes today. Plan for LTAC when able  Antibiotics Given (last 72 hours)    None          Interim History / Subjective:  Remains intubated Remains critically ill Severe brain damage Prognosis is very poor   Objective   Blood pressure 129/68, pulse 66, temperature 98.4 F (36.9 C), resp. rate 17, height 6' 0.76" (1.848 m), weight 114.1 kg, SpO2 100 %.    Vent Mode: Spontaneous FiO2 (%):  [28 %] 28 % PEEP:  [5 cmH20] 5 cmH20 Pressure Support:  [5 cmH20] 5 cmH20   Intake/Output Summary (Last 24 hours) at 01/16/2021 1722 Last data filed at 01/16/2021 1200 Gross per 24 hour  Intake 1915.52 ml  Output 750 ml  Net 1165.52 ml   Filed Weights   01/11/21 0449 01/12/21 0348 01/14/21 0500  Weight: 113.5 kg 112.1 kg 114.1 kg      REVIEW OF SYSTEMS  PATIENT IS UNABLE TO PROVIDE COMPLETE REVIEW OF SYSTEMS DUE TO SEVERE CRITICAL ILLNESS AND TOXIC METABOLIC ENCEPHALOPATHY   PHYSICAL EXAMINATION:  GENERAL:chronically ill apprearing HEAD: Normocephalic, atraumatic.  EYES: Pupils equal, round, reactive to light.  No scleral icterus.  MOUTH: Moist mucosal membrane. NECK: Supple.s/p trach PULMONARY: +rhonchi, CARDIOVASCULAR: S1 and S2. Regular rate and rhythm. No murmurs, rubs, or gallops.  GASTROINTESTINAL: Soft, nontender, -distended. Positive bowel sounds. S/p PEG tube MUSCULOSKELETAL: No swelling, clubbing, or edema.  NEUROLOGIC: comatose GCS3T SKIN:intact,warm,dry   Labs/imaging that I havepersonally reviewed  (right  click and "Reselect all SmartList Selections" daily)        ASSESSMENT AND PLAN SYNOPSIS   60 yo morbidly obese AAM with acute and severe cardiac arrest NSTEMI with acute severe systolic cardiac failure with signs and symptoms of severe anoxic encephalopathy. +PROTEUS PNEUMONIA   Anoxic brain injury  -Subacute infarcts - per MRI brain  -s/p Neuro eval- A/R: 61 year old male status-post cardiac arrest - Given EEG which is not flatline or very low voltage, the patient's long-term neurological prognosis is likely to be better than originally anticipated.  - The cortical restricted diffusion seen on prior MRI may therefore also be reversible. This can be reassessed with a follow up MRI brain. If the patient is stable for MRI, would obtain today or tomorrow.  - Would attempt to wean off all sedation after repeat MRI is obtained.   Electronically signed: Dr. Caryl Pina   Severe ACUTE Hypoxic and Hypercapnic Respiratory Failure - due to pneumonia  -s/p trache and PEG   CARDIAC FAILURE-acute  - Systolic dysfunction per TTE 12/27/20  CARDIAC ICU monitoring   ACUTE KIDNEY INJURY/Renal Failure - with pre-renal azotemia -IVF 50/h    INFECTIOUS DISEASE+PROTEUS PNEUMONIA -continue antibiotics as prescribed -follow up cultures -follow up ID consultation  ENDO - ICU hypoglycemic\Hyperglycemia protocol -check FSBS per protocol   GI GI PROPHYLAXIS as indicated  NUTRITIONAL STATUS DIET-->TF's as tolerated Constipation protocol as indicated   ELECTROLYTES -follow labs as needed -replace as needed -pharmacy consultation and following    Best practice (right click and "Reselect all SmartList Selections" daily)  Diet:NPO Pain/Anxiety/Delirium protocol (if indicated):Yes(RASS goal -1,-2) VAP protocol (if indicated):Yes DVT prophylaxis:ON hold GI prophylaxis:H2B Glucose control:SSIYes Central venous access:Yes, and it is still needed Foley:Yes, and it is still needed Mobility:bed rest PT  consulted:No DNR STATUS Disposition:ICU    Labs   CBC: Recent Labs  Lab 01/12/21 0401 01/13/21 0515 01/14/21 0430 01/15/21 0520 01/16/21 0458  WBC 8.6 7.9 7.0 6.6 7.1  NEUTROABS 5.0 4.5 3.9 3.4  --   HGB 10.9* 10.3* 11.2* 10.5* 9.9*  HCT 35.4* 34.3* 37.6* 34.4* 32.6*  MCV 82.5 82.1 82.6 81.9 81.7  PLT 397 310 317 279 235    Basic Metabolic Panel: Recent Labs  Lab 01/12/21 0401 01/12/21 1023 01/13/21 0515 01/14/21 0430 01/15/21 0520 01/16/21 0458  NA 151*  --  151* 150* 147* 149*  K 5.4* 5.0 5.0 4.2 4.0 4.1  CL 122*  --  122* 120* 116* 118*  CO2 21*  --  21* 22 21* 23  GLUCOSE 144*  --  151* 174* 184* 131*  BUN 68*  --  62* 66* 67* 66*  CREATININE 1.63*  --  1.76* 1.68* 1.85* 1.74*  CALCIUM 9.1  --  9.0 9.6 9.1 9.0  MG 2.7*  --  2.8* 2.6* 2.3 2.5*  PHOS 4.8*  --  4.8* 4.4 4.2 5.0*   GFR: Estimated Creatinine Clearance: 59.5 mL/min (A) (by C-G formula based on SCr of 1.74 mg/dL (H)). Recent Labs  Lab 01/13/21 0515 01/14/21 0430 01/15/21 0520 01/16/21 0458  WBC 7.9 7.0 6.6 7.1    Liver Function Tests: No results for input(s): AST, ALT, ALKPHOS, BILITOT, PROT, ALBUMIN in the last 168 hours. No results for input(s): LIPASE, AMYLASE in the last 168 hours. No results for input(s): AMMONIA in the last 168 hours.  ABG    Component Value Date/Time   PHART 7.37 12/29/2020 1032   PCO2ART 35 12/29/2020 1032   PO2ART 81 (L)  12/29/2020 1032   HCO3 20.2 12/29/2020 1032   ACIDBASEDEF 4.5 (H) 12/29/2020 1032   O2SAT 95.5 12/29/2020 1032     Coagulation Profile: No results for input(s): INR, PROTIME in the last 168 hours.  Cardiac Enzymes: No results for input(s): CKTOTAL, CKMB, CKMBINDEX, TROPONINI in the last 168 hours.  HbA1C: Hgb A1c MFr Bld  Date/Time Value Ref Range Status  12/25/2020 04:46 PM 5.9 (H) 4.8 - 5.6 % Final    Comment:    (NOTE) Pre diabetes:          5.7%-6.4%  Diabetes:              >6.4%  Glycemic control for   <7.0% adults  with diabetes     CBG: Recent Labs  Lab 01/15/21 2334 01/16/21 0403 01/16/21 0730 01/16/21 1120 01/16/21 1547  GLUCAP 151* 125* 110* 167* 126*    Allergies Not on File     DVT/GI PRX  assessed I Assessed the need for Labs I Assessed the need for Foley I Assessed the need for Central Venous Line Family Discussion when available I Assessed the need for Mobilization I made an Assessment of medications to be adjusted accordingly Safety Risk assessment completed  CASE DISCUSSED IN MULTIDISCIPLINARY ROUNDS WITH ICU TEAM     Critical Care Time devoted to patient care services described in this note is 33 minutes.  Critical care was necessary to treat or prevent imminent or life-threatening deterioration. Overall, patient is critically ill, prognosis is guarded.  Patient with Multiorgan failure and at high risk for cardiac arrest and death.    Vida Rigger, M.D.  Pulmonary & Critical Care Medicine  [facility]

## 2021-01-16 NOTE — Progress Notes (Signed)
PHARMACY CONSULT NOTE  Pharmacy Consult for Electrolyte Monitoring and Replacement   Recent Labs: Potassium (mmol/L)  Date Value  01/16/2021 4.1   Magnesium (mg/dL)  Date Value  27/01/2375 2.5 (H)   Calcium (mg/dL)  Date Value  28/31/5176 9.0   Albumin (g/dL)  Date Value  16/02/3709 2.4 (L)   Phosphorus (mg/dL)  Date Value  62/69/4854 5.0 (H)   Sodium (mmol/L)  Date Value  01/16/2021 149 (H)    Assessment:  60 year old male with obesity, diabetes mellitus, hypertension, and combined diastolic and systolic heart failure who presented to the emergency department yesterday in respiratory distress admitted following witnessed cardiac arrest. Pt is S/P CPR. MRI with infarcts. PEG tube placed 5/24, tracheostomy 5/25. Remains on ventilator. Pharmacy consulted for electrolyte monitoring and replacement. Anoxic brain injury  PROSource 90 mL TID >> 18ml bid Nepro 60 ml/hr Free water 250 mL q4h  Patient increasingly hypertensive after addition of 2/3 home antihypertensives. Concern for possible pain component. Addition of oxycodone 5 mg q6h in addition to previously ordered fentanyl q2h PRN.  Goal of Therapy:  Electrolytes WNL  Plan:  K 4.1  Mag 2.5  Phos 5.0  Scr 1.74   Na 149  On Lokelma 10 mg per tube daily.  No electrolyte replacement at this time  - free water flushes at 250 ml q4h.  F/u electrolytes with am labs  Angelique Blonder, PharmD Clinical Pharmacist 01/16/2021 9:44 AM

## 2021-01-17 LAB — MAGNESIUM: Magnesium: 2.1 mg/dL (ref 1.7–2.4)

## 2021-01-17 LAB — CBC
HCT: 31.9 % — ABNORMAL LOW (ref 39.0–52.0)
Hemoglobin: 9.6 g/dL — ABNORMAL LOW (ref 13.0–17.0)
MCH: 24.9 pg — ABNORMAL LOW (ref 26.0–34.0)
MCHC: 30.1 g/dL (ref 30.0–36.0)
MCV: 82.9 fL (ref 80.0–100.0)
Platelets: 223 10*3/uL (ref 150–400)
RBC: 3.85 MIL/uL — ABNORMAL LOW (ref 4.22–5.81)
RDW: 16.6 % — ABNORMAL HIGH (ref 11.5–15.5)
WBC: 7.3 10*3/uL (ref 4.0–10.5)
nRBC: 0.3 % — ABNORMAL HIGH (ref 0.0–0.2)

## 2021-01-17 LAB — BASIC METABOLIC PANEL
Anion gap: 10 (ref 5–15)
BUN: 58 mg/dL — ABNORMAL HIGH (ref 6–20)
CO2: 22 mmol/L (ref 22–32)
Calcium: 8.9 mg/dL (ref 8.9–10.3)
Chloride: 114 mmol/L — ABNORMAL HIGH (ref 98–111)
Creatinine, Ser: 1.42 mg/dL — ABNORMAL HIGH (ref 0.61–1.24)
GFR, Estimated: 57 mL/min — ABNORMAL LOW (ref >60)
Glucose, Bld: 159 mg/dL — ABNORMAL HIGH (ref 70–99)
Potassium: 4.1 mmol/L (ref 3.5–5.1)
Sodium: 146 mmol/L — ABNORMAL HIGH (ref 135–145)

## 2021-01-17 LAB — GLUCOSE, CAPILLARY
Glucose-Capillary: 135 mg/dL — ABNORMAL HIGH (ref 70–99)
Glucose-Capillary: 142 mg/dL — ABNORMAL HIGH (ref 70–99)
Glucose-Capillary: 138 mg/dL — ABNORMAL HIGH (ref 70–99)
Glucose-Capillary: 118 mg/dL — ABNORMAL HIGH (ref 70–99)
Glucose-Capillary: 128 mg/dL — ABNORMAL HIGH (ref 70–99)

## 2021-01-17 LAB — PHOSPHORUS: Phosphorus: 4 mg/dL (ref 2.5–4.6)

## 2021-01-17 MED ORDER — HYDRALAZINE HCL 25 MG PO TABS
25.0000 mg | ORAL_TABLET | Freq: Four times a day (QID) | ORAL | Status: DC
  Administered 2021-01-17 – 2021-02-03 (×65): 25 mg
  Filled 2021-01-17 (×63): qty 1

## 2021-01-17 NOTE — Progress Notes (Signed)
PHARMACY CONSULT NOTE  Pharmacy Consult for Electrolyte Monitoring and Replacement   Recent Labs: Potassium (mmol/L)  Date Value  01/17/2021 4.1   Magnesium (mg/dL)  Date Value  97/98/9211 2.1   Calcium (mg/dL)  Date Value  94/17/4081 8.9   Albumin (g/dL)  Date Value  44/81/8563 2.4 (L)   Phosphorus (mg/dL)  Date Value  14/97/0263 4.0   Sodium (mmol/L)  Date Value  01/17/2021 146 (H)    Assessment:  60 year old male with obesity, diabetes mellitus, hypertension, and combined diastolic and systolic heart failure who presented to the emergency department yesterday in respiratory distress admitted following witnessed cardiac arrest. Pt is S/P CPR. MRI with infarcts. PEG tube placed 5/24, tracheostomy 5/25. Pharmacy consulted for electrolyte monitoring and replacement.  PROSource 90 mL TID >> 43ml bid Nepro 60 ml/hr Free water 250 mL q4h  MIVF: lactated ringers at 40 mL/hr  Goal of Therapy:  Electrolytes WNL  Plan:   On Lokelma 10 mg per tube daily and potassium has been stable  No electrolyte replacement at this time  Hypernatremia improving on free water   F/u electrolytes with am labs  Lowella Bandy, PharmD Clinical Pharmacist 01/17/2021 11:43 AM

## 2021-01-17 NOTE — TOC Progression Note (Signed)
Transition of Care [facility]) - Progression Note    Patient Details  Name: Jeremy Browning MRN: [id number] Date of Birth: 24-Sep-1960  [facility] of [facility]) CM/SW Contact  Marina Goodell Phone Number: [phone] 01/17/2021, 11:25 AM  Clinical Narrative:     CSW continuing to look for LTC placement. CSW spoke with Ms. Ellie Spickler (sister) [phone], to update on placement search.   Expected Discharge Plan: Skilled Nursing Facility Barriers to Discharge: Continued Medical Work up  Expected Discharge Plan and Services Expected Discharge Plan: Skilled Nursing Facility In-house Referral: Clinical Social Work   Post Acute Care Choice: Skilled Nursing Facility                                         Social Determinants of Health (SDOH) Interventions    Readmission Risk Interventions No flowsheet data found.

## 2021-01-17 NOTE — Progress Notes (Signed)
Sutures removed from trach, placed new trach ties and gauze on. Tolerated well, no complications.

## 2021-01-17 NOTE — Progress Notes (Signed)
NAME:  Jeremy Browning, MRN:  [id number], DOB:  1960-08-24, LOS: 53 ADMISSION DATE:  12/25/2020 60 yo M presenting to [facility] ED from home via EMS with respiratory distress that had been progressively worsening over the course of 12/25/20. Per documentation family stated the patient was in his normal state of health until this morning when he began feeling unwell. He then called out stating he could not breathe. EMS found the patient hypoxic and in distress with SpO2 in the 70's on room air. Patient was place on CPAP, arriving to the ED unresponsive and pulseless. ED course: Upon arrival to ED patient was defibrillated x 1 and received CPR before achieving ROSC. Initial EKG suggestive of global ischemia, second EKG had possible LBBB vs IVCD. Patient was emergently intubated requiring mechanical ventilation. Per ED documentation STEMI team activated in the setting of a witnessed shockable cardiac arrest with clinical exam highly suggestive of pulmonary edema with pink frothy sputum throughout ET tubing.  Pertinent Medical History  Obesity Hypertension Diabetes mellitus Chronic renal insufficiency Combined systolic and diastolic heart failure  Significant Hospital Events: Including procedures, antibiotic start and stop dates in addition to other pertinent events   Cardiac catheterization on presentation with normal coronaries, good cardiac output, elevated pulmonary capillary wedge pressure.  5/7 severe resp failure 5/8remains on Vent 5/9remains on vent , multiorgan failureMRI shows brain injury 5/10remains on vent, multiorgan failure, brother updated 5/11severe resp failure 5/76filed weaning trials due to severe brain damage 5/168fled weaning trials severe hypoxia dn vent dyssynchrony, sister updated 5/14vent support, severe brain damage, prognosis is poor 5/16unresponsive on wake up assessment, becomes tachypneic when sedation is lightened 5/17no purposeful movement on wake up  assessment, becomes tachypneic, asynchronous with vent 5/18remains critically ill, severe brain damage, patient made DNR 5/2072fly decided on TRACH/PEG tube 5/23severe brain damage, no further imaging needed 5/24 severe brain damage, PEG tube placed [facility] s/p trach PEG tube 01/14/21- no changes today, awaitingj permanent placement  01/16/21- patient is stable without changes today. Plan for LTAC when able 01/17/21-met with family at bedside AngLevada Dyd SheFreda Munroisters of patient) they are hopeful for a recovery and appreciative of care. Questions answered medical plan reviewed.   Antibiotics Given (last 72 hours)    None          Interim History / Subjective:  Remains intubated Remains critically ill Severe brain damage Prognosis is very poor   Objective   Blood pressure (!) 146/38, pulse 78, temperature 98.4 F (36.9 C), temperature source Axillary, resp. rate 18, height 6' 0.76" (1.848 m), weight 114.1 kg, SpO2 100 %.    Vent Mode: PSV FiO2 (%):  [28 %] 28 % PEEP:  [5 cmH20] 5 cmH20 Pressure Support:  [5 cmH20] 5 cmH20   Intake/Output Summary (Last 24 hours) at 01/17/2021 1030 Last data filed at 01/17/2021 0500 Gross per 24 hour  Intake 748.55 ml  Output 1500 ml  Net -751.45 ml   Filed Weights   01/11/21 0449 01/12/21 0348 01/14/21 0500  Weight: 113.5 kg 112.1 kg 114.1 kg      REVIEW OF SYSTEMS  PATIENT IS UNABLE TO PROVIDE COMPLETE REVIEW OF SYSTEMS DUE TO SEVERE CRITICAL ILLNESS  S/p CVA   PHYSICAL EXAMINATION:  GENERAL:chronically ill apprearing HEAD: Normocephalic, atraumatic.  EYES: Pupils equal, round, reactive to light.  No scleral icterus.  MOUTH: Moist mucosal membrane. NECK: Supple.s/p trach PULMONARY: +rhonchi, CARDIOVASCULAR: S1 and S2. Regular rate and rhythm. No murmurs, rubs, or gallops.  GASTROINTESTINAL: Soft, nontender, -distended. Positive bowel sounds. S/p PEG tube MUSCULOSKELETAL: No swelling,  clubbing, or edema.  NEUROLOGIC: comatose GCS3T SKIN:intact,warm,dry   Labs/imaging that I havepersonally reviewed  (right click and "Reselect all SmartList Selections" daily)       ASSESSMENT AND PLAN SYNOPSIS   60 yo morbidly obese AAM with acute and severe cardiac arrest NSTEMI with acute severe systolic cardiac failure with signs and symptoms of severe anoxic encephalopathy. +PROTEUS PNEUMONIA +CVA  Anoxic brain injury  -Subacute infarcts - per MRI brain  -s/p Neuro eval- A/R: 60 year old male status-post cardiac arrest - Given EEG which is not flatline or very low voltage, the patient's long-term neurological prognosis is likely to be better than originally anticipated.  - The cortical restricted diffusion seen on prior MRI may therefore also be reversible. This can be reassessed with a follow up MRI brain. If the patient is stable for MRI, would obtain today or tomorrow.  - Would attempt to wean off all sedation after repeat MRI is obtained.   Electronically signed: Dr. Kerney Elbe   Severe ACUTE Hypoxic and Hypercapnic Respiratory Failure - due to pneumonia  -s/p trache and PEG   CARDIAC FAILURE-acute  - Systolic dysfunction per TTE 12/27/20  CARDIAC ICU monitoring   ACUTE KIDNEY INJURY/Renal Failure - with pre-renal azotemia -IVF 50/h    INFECTIOUS DISEASE+PROTEUS PNEUMONIA -continue antibiotics as prescribed -follow up cultures -follow up ID consultation  ENDO - ICU hypoglycemic\Hyperglycemia protocol -check FSBS per protocol   GI GI PROPHYLAXIS as indicated  NUTRITIONAL STATUS DIET-->TF's as tolerated Constipation protocol as indicated   ELECTROLYTES -follow labs as needed -replace as needed -pharmacy consultation and following    Best practice (right click and "Reselect all SmartList Selections" daily)  Diet:NPO Pain/Anxiety/Delirium protocol (if indicated):Yes(RASS goal -1,-2) VAP protocol (if indicated):Yes DVT  prophylaxis:ON hold GI prophylaxis:H2B Glucose control:SSIYes Central venous access:Yes, and it is still needed Foley:Yes, and it is still needed Mobility:bed rest PT consulted:No DNR STATUS Disposition:ICU    Labs   CBC: Recent Labs  Lab 01/12/21 0401 01/13/21 0515 01/14/21 0430 01/15/21 0520 01/16/21 0458 01/17/21 0440  WBC 8.6 7.9 7.0 6.6 7.1 7.3  NEUTROABS 5.0 4.5 3.9 3.4  --   --   HGB 10.9* 10.3* 11.2* 10.5* 9.9* 9.6*  HCT 35.4* 34.3* 37.6* 34.4* 32.6* 31.9*  MCV 82.5 82.1 82.6 81.9 81.7 82.9  PLT 397 310 317 279 235 409    Basic Metabolic Panel: Recent Labs  Lab 01/12/21 0401 01/12/21 1023 01/13/21 0515 01/14/21 0430 01/15/21 0520 01/16/21 0458  NA 151*  --  151* 150* 147* 149*  K 5.4* 5.0 5.0 4.2 4.0 4.1  CL 122*  --  122* 120* 116* 118*  CO2 21*  --  21* 22 21* 23  GLUCOSE 144*  --  151* 174* 184* 131*  BUN 68*  --  62* 66* 67* 66*  CREATININE 1.63*  --  1.76* 1.68* 1.85* 1.74*  CALCIUM 9.1  --  9.0 9.6 9.1 9.0  MG 2.7*  --  2.8* 2.6* 2.3 2.5*  PHOS 4.8*  --  4.8* 4.4 4.2 5.0*   GFR: Estimated Creatinine Clearance: 59.5 mL/min (A) (by C-G formula based on SCr of 1.74 mg/dL (H)). Recent Labs  Lab 01/14/21 0430 01/15/21 0520 01/16/21 0458 01/17/21 0440  WBC 7.0 6.6 7.1 7.3    Liver Function Tests: No results for input(s): AST, ALT, ALKPHOS, BILITOT, PROT, ALBUMIN in the last 168 hours. No results for input(s): LIPASE, AMYLASE  in the last 168 hours. No results for input(s): AMMONIA in the last 168 hours.  ABG    Component Value Date/Time   PHART 7.37 12/29/2020 1032   PCO2ART 35 12/29/2020 1032   PO2ART 81 (L) 12/29/2020 1032   HCO3 20.2 12/29/2020 1032   ACIDBASEDEF 4.5 (H) 12/29/2020 1032   O2SAT 95.5 12/29/2020 1032     Coagulation Profile: No results for input(s): INR, PROTIME in the last 168 hours.  Cardiac Enzymes: No results for input(s): CKTOTAL, CKMB, CKMBINDEX, TROPONINI in the last 168  hours.  HbA1C: Hgb A1c MFr Bld  Date/Time Value Ref Range Status  12/25/2020 04:46 PM 5.9 (H) 4.8 - 5.6 % Final    Comment:    (NOTE) Pre diabetes:          5.7%-6.4%  Diabetes:              >6.4%  Glycemic control for   <7.0% adults with diabetes     CBG: Recent Labs  Lab 01/16/21 1547 01/16/21 1955 01/16/21 2345 01/17/21 0400 01/17/21 0738  GLUCAP 126* 119* 158* 135* 142*    Allergies Not on File     DVT/GI PRX  assessed I Assessed the need for Labs I Assessed the need for Foley I Assessed the need for Central Venous Line Family Discussion when available I Assessed the need for Mobilization I made an Assessment of medications to be adjusted accordingly Safety Risk assessment completed  CASE DISCUSSED IN MULTIDISCIPLINARY ROUNDS WITH ICU TEAM     Critical Care Time devoted to patient care services described in this note is 33 minutes.  Critical care was necessary to treat or prevent imminent or life-threatening deterioration. Overall, patient is critically ill, prognosis is guarded.  Patient with Multiorgan failure and at high risk for cardiac arrest and death.    Ottie Glazier, M.D.  Pulmonary & [place]

## 2021-01-17 NOTE — Plan of Care (Signed)
Uneventful night. Pt on trach collar throughout night, tolerating well. Handoff report given to sheliegh,RN. Relinquishing care Problem: Clinical Measurements: Goal: Ability to maintain clinical measurements within normal limits will improve Outcome: Progressing Goal: Will remain free from infection Outcome: Progressing Goal: Diagnostic test results will improve Outcome: Progressing Goal: Respiratory complications will improve Outcome: Progressing Goal: Cardiovascular complication will be avoided Outcome: Progressing   Problem: Nutrition: Goal: Adequate nutrition will be maintained Outcome: Progressing   Problem: Elimination: Goal: Will not experience complications related to bowel motility Outcome: Progressing Goal: Will not experience complications related to urinary retention Outcome: Progressing   Problem: Pain Managment: Goal: General experience of comfort will improve Outcome: Progressing   Problem: Safety: Goal: Ability to remain free from injury will improve Outcome: Progressing   Problem: Skin Integrity: Goal: Risk for impaired skin integrity will decrease Outcome: Progressing   Problem: Activity: Goal: Ability to tolerate increased activity will improve Outcome: Progressing   Problem: Respiratory: Goal: Ability to maintain a clear airway and adequate ventilation will improve Outcome: Progressing   Problem: Role Relationship: Goal: Method of communication will improve Outcome: Progressing

## 2021-01-18 LAB — GLUCOSE, CAPILLARY
Glucose-Capillary: 127 mg/dL — ABNORMAL HIGH (ref 70–99)
Glucose-Capillary: 128 mg/dL — ABNORMAL HIGH (ref 70–99)
Glucose-Capillary: 134 mg/dL — ABNORMAL HIGH (ref 70–99)
Glucose-Capillary: 135 mg/dL — ABNORMAL HIGH (ref 70–99)
Glucose-Capillary: 143 mg/dL — ABNORMAL HIGH (ref 70–99)
Glucose-Capillary: 146 mg/dL — ABNORMAL HIGH (ref 70–99)

## 2021-01-18 LAB — BASIC METABOLIC PANEL
Anion gap: 10 (ref 5–15)
BUN: 47 mg/dL — ABNORMAL HIGH (ref 6–20)
CO2: 21 mmol/L — ABNORMAL LOW (ref 22–32)
Calcium: 8.6 mg/dL — ABNORMAL LOW (ref 8.9–10.3)
Chloride: 114 mmol/L — ABNORMAL HIGH (ref 98–111)
Creatinine, Ser: 1.34 mg/dL — ABNORMAL HIGH (ref 0.61–1.24)
GFR, Estimated: >60 mL/min (ref >60)
Glucose, Bld: 157 mg/dL — ABNORMAL HIGH (ref 70–99)
Potassium: 4 mmol/L (ref 3.5–5.1)
Sodium: 145 mmol/L (ref 135–145)

## 2021-01-18 LAB — CBC
HCT: 30 % — ABNORMAL LOW (ref 39.0–52.0)
Hemoglobin: 8.9 g/dL — ABNORMAL LOW (ref 13.0–17.0)
MCH: 24.5 pg — ABNORMAL LOW (ref 26.0–34.0)
MCHC: 29.7 g/dL — ABNORMAL LOW (ref 30.0–36.0)
MCV: 82.4 fL (ref 80.0–100.0)
Platelets: 206 10*3/uL (ref 150–400)
RBC: 3.64 MIL/uL — ABNORMAL LOW (ref 4.22–5.81)
RDW: 16.4 % — ABNORMAL HIGH (ref 11.5–15.5)
WBC: 7.1 10*3/uL (ref 4.0–10.5)
nRBC: 0 % (ref 0.0–0.2)

## 2021-01-18 LAB — PHOSPHORUS: Phosphorus: 3.5 mg/dL (ref 2.5–4.6)

## 2021-01-18 LAB — MAGNESIUM: Magnesium: 1.9 mg/dL (ref 1.7–2.4)

## 2021-01-18 MED ORDER — IPRATROPIUM-ALBUTEROL 0.5-2.5 (3) MG/3ML IN SOLN
3.0000 mL | Freq: Three times a day (TID) | RESPIRATORY_TRACT | Status: DC
  Administered 2021-01-19 – 2021-01-27 (×24): 3 mL via RESPIRATORY_TRACT
  Filled 2021-01-18 (×25): qty 3

## 2021-01-18 NOTE — Progress Notes (Signed)
NAME:  Jeremy Browning, MRN:  [id number], DOB:  July 31, 1961, LOS: 33 ADMISSION DATE:  12/25/2020 60 yo M presenting to [facility] ED from home via EMS with respiratory distress that had been progressively worsening over the course of 12/25/20. Per documentation family stated the patient was in his normal state of health until this morning when he began feeling unwell. He then called out stating he could not breathe. EMS found the patient hypoxic and in distress with SpO2 in the 70's on room air. Patient was place on CPAP, arriving to the ED unresponsive and pulseless. ED course: Upon arrival to ED patient was defibrillated x 1 and received CPR before achieving ROSC. Initial EKG suggestive of global ischemia, second EKG had possible LBBB vs IVCD. Patient was emergently intubated requiring mechanical ventilation. Per ED documentation STEMI team activated in the setting of a witnessed shockable cardiac arrest with clinical exam highly suggestive of pulmonary edema with pink frothy sputum throughout ET tubing.  Pertinent Medical History  Obesity Hypertension Diabetes mellitus Chronic renal insufficiency Combined systolic and diastolic heart failure  Significant Hospital Events: Including procedures, antibiotic start and stop dates in addition to other pertinent events   Cardiac catheterization on presentation with normal coronaries, good cardiac output, elevated pulmonary capillary wedge pressure.  5/7 severe resp failure 5/8remains on Vent 5/9remains on vent , multiorgan failureMRI shows brain injury 5/10remains on vent, multiorgan failure, brother updated 5/11severe resp failure 5/107filed weaning trials due to severe brain damage 5/143fled weaning trials severe hypoxia dn vent dyssynchrony, sister updated 5/14vent support, severe brain damage, prognosis is poor 5/16unresponsive on wake up assessment, becomes tachypneic when sedation is lightened 5/17no purposeful movement on wake up  assessment, becomes tachypneic, asynchronous with vent 5/18remains critically ill, severe brain damage, patient made DNR 5/2043fly decided on TRACH/PEG tube 5/23severe brain damage, no further imaging needed 5/24 severe brain damage, PEG tube placed [facility] s/p trach PEG tube 01/14/21- no changes today, awaitingj permanent placement  01/16/21- patient is stable without changes today. Plan for LTAC when able 01/17/21-met with family at bedside AngLevada Dyd SheFreda Munroisters of patient) they are hopeful for a recovery and appreciative of care. Questions answered medical plan reviewed.  01/18/21- patient stable on trache #8 - 28% FiO2, respond to pain with eye opening, awaiting LTACH assignment.  PiCC+. Reviewed plan with family including brother, SIL and two sisters.   Antibiotics Given (last 72 hours)    None          Interim History / Subjective:  Remains intubated Remains critically ill Severe brain damage Prognosis is very poor   Objective   Blood pressure (!) 145/52, pulse 70, temperature 99.6 F (37.6 C), temperature source Axillary, resp. rate (!) 22, height 6' 0.76" (1.848 m), weight 118 kg, SpO2 100 %.    FiO2 (%):  [28 %] 28 %   Intake/Output Summary (Last 24 hours) at 01/18/2021 0841 Last data filed at 01/18/2021 0600 Gross per 24 hour  Intake 3409.08 ml  Output 1700 ml  Net 1709.08 ml   Filed Weights   01/12/21 0348 01/14/21 0500 01/18/21 0500  Weight: 112.1 kg 114.1 kg 118 kg      REVIEW OF SYSTEMS  PATIENT IS UNABLE TO PROVIDE COMPLETE REVIEW OF SYSTEMS DUE TO SEVERE CRITICAL ILLNESS  S/p CVA   PHYSICAL EXAMINATION:  GENERAL:chronically ill apprearing HEAD: Normocephalic, atraumatic.  EYES: Pupils equal, round, reactive to light.  No scleral icterus.  MOUTH: Moist mucosal membrane. NECK: Supple.s/p trach  PULMONARY: +rhonchi, CARDIOVASCULAR: S1 and S2. Regular rate and rhythm. No murmurs, rubs, or gallops.   GASTROINTESTINAL: Soft, nontender, -distended. Positive bowel sounds. S/p PEG tube MUSCULOSKELETAL: No swelling, clubbing, or edema.  NEUROLOGIC: comatose GCS3T SKIN:intact,warm,dry   Labs/imaging that I havepersonally reviewed  (right click and "Reselect all SmartList Selections" daily)       ASSESSMENT AND PLAN SYNOPSIS   60 yo morbidly obese AAM with acute and severe cardiac arrest NSTEMI with acute severe systolic cardiac failure with signs and symptoms of severe anoxic encephalopathy. +PROTEUS PNEUMONIA +CVA  Anoxic brain injury  -Subacute infarcts - per MRI brain  -s/p Neuro eval- A/R: 60 year old male status-post cardiac arrest - Given EEG which is not flatline or very low voltage, the patient's long-term neurological prognosis is likely to be better than originally anticipated.  - The cortical restricted diffusion seen on prior MRI may therefore also be reversible. This can be reassessed with a follow up MRI brain. If the patient is stable for MRI, would obtain today or tomorrow. Electronically signed: Dr. Kerney Elbe -for ltach palcment      Severe ACUTE Hypoxic and Hypercapnic Respiratory Failure - due to pneumonia  -s/p trache and PEG   CARDIAC FAILURE-acute  - Systolic dysfunction per TTE 12/27/20  CARDIAC ICU monitoring   ACUTE KIDNEY INJURY/Renal Failure - with pre-renal azotemia -IVF 50/h    INFECTIOUS DISEASE+PROTEUS PNEUMONIA -continue antibiotics as prescribed -follow up cultures -follow up ID consultation  ENDO - ICU hypoglycemic\Hyperglycemia protocol -check FSBS per protocol   GI GI PROPHYLAXIS as indicated  NUTRITIONAL STATUS DIET-->TF's as tolerated Constipation protocol as indicated   ELECTROLYTES -follow labs as needed -replace as needed -pharmacy consultation and following    Best practice (right click and "Reselect all SmartList Selections" daily)  Diet:NPO Pain/Anxiety/Delirium protocol (if  indicated):Yes(RASS goal -1,-2) VAP protocol (if indicated):Yes DVT prophylaxis:ON hold GI prophylaxis:H2B Glucose control:SSIYes Central venous access:Yes, and it is still needed Foley:Yes, and it is still needed Mobility:bed rest PT consulted:No DNR STATUS Disposition:ICU    Labs   CBC: Recent Labs  Lab 01/12/21 0401 01/13/21 0515 01/14/21 0430 01/15/21 0520 01/16/21 0458 01/17/21 0440 01/18/21 0512  WBC 8.6 7.9 7.0 6.6 7.1 7.3 7.1  NEUTROABS 5.0 4.5 3.9 3.4  --   --   --   HGB 10.9* 10.3* 11.2* 10.5* 9.9* 9.6* 8.9*  HCT 35.4* 34.3* 37.6* 34.4* 32.6* 31.9* 30.0*  MCV 82.5 82.1 82.6 81.9 81.7 82.9 82.4  PLT 397 310 317 279 235 223 709    Basic Metabolic Panel: Recent Labs  Lab 01/14/21 0430 01/15/21 0520 01/16/21 0458 01/17/21 0440 01/18/21 0512  NA 150* 147* 149* 146* 145  K 4.2 4.0 4.1 4.1 4.0  CL 120* 116* 118* 114* 114*  CO2 22 21* 23 22 21*  GLUCOSE 174* 184* 131* 159* 157*  BUN 66* 67* 66* 58* 47*  CREATININE 1.68* 1.85* 1.74* 1.42* 1.34*  CALCIUM 9.6 9.1 9.0 8.9 8.6*  MG 2.6* 2.3 2.5* 2.1 1.9  PHOS 4.4 4.2 5.0* 4.0 3.5   GFR: Estimated Creatinine Clearance: 78.6 mL/min (A) (by C-G formula based on SCr of 1.34 mg/dL (H)). Recent Labs  Lab 01/15/21 0520 01/16/21 0458 01/17/21 0440 01/18/21 0512  WBC 6.6 7.1 7.3 7.1    Liver Function Tests: No results for input(s): AST, ALT, ALKPHOS, BILITOT, PROT, ALBUMIN in the last 168 hours. No results for input(s): LIPASE, AMYLASE in the last 168 hours. No results for input(s): AMMONIA in the last 168  hours.  ABG    Component Value Date/Time   PHART 7.37 12/29/2020 1032   PCO2ART 35 12/29/2020 1032   PO2ART 81 (L) 12/29/2020 1032   HCO3 20.2 12/29/2020 1032   ACIDBASEDEF 4.5 (H) 12/29/2020 1032   O2SAT 95.5 12/29/2020 1032     Coagulation Profile: No results for input(s): INR, PROTIME in the last 168 hours.  Cardiac Enzymes: No results for input(s): CKTOTAL, CKMB, CKMBINDEX,  TROPONINI in the last 168 hours.  HbA1C: Hgb A1c MFr Bld  Date/Time Value Ref Range Status  12/25/2020 04:46 PM 5.9 (H) 4.8 - 5.6 % Final    Comment:    (NOTE) Pre diabetes:          5.7%-6.4%  Diabetes:              >6.4%  Glycemic control for   <7.0% adults with diabetes     CBG: Recent Labs  Lab 01/17/21 1130 01/17/21 1620 01/17/21 1933 01/18/21 0005 01/18/21 0356  GLUCAP 138* 118* 128* 127* 146*    Allergies Not on File     DVT/GI PRX  assessed I Assessed the need for Labs I Assessed the need for Foley I Assessed the need for Central Venous Line Family Discussion when available I Assessed the need for Mobilization I made an Assessment of medications to be adjusted accordingly Safety Risk assessment completed  CASE DISCUSSED IN MULTIDISCIPLINARY ROUNDS WITH ICU TEAM     Critical Care Time devoted to patient care services described in this note is 33 minutes.  Critical care was necessary to treat or prevent imminent or life-threatening deterioration. Overall, patient is critically ill, prognosis is guarded.  Patient with Multiorgan failure and at high risk for cardiac arrest and death.    Ottie Glazier, M.D.  Pulmonary & [place]

## 2021-01-18 NOTE — Progress Notes (Signed)
PHARMACY CONSULT NOTE  Pharmacy Consult for Electrolyte Monitoring and Replacement   Recent Labs: Potassium (mmol/L)  Date Value  01/18/2021 4.0   Magnesium (mg/dL)  Date Value  28/63/8177 1.9   Calcium (mg/dL)  Date Value  11/65/7903 8.6 (L)   Albumin (g/dL)  Date Value  83/33/8329 2.4 (L)   Phosphorus (mg/dL)  Date Value  19/16/6060 3.5   Sodium (mmol/L)  Date Value  01/18/2021 145    Assessment:  60 year old male with obesity, diabetes mellitus, hypertension, and combined diastolic and systolic heart failure who presented to the emergency department yesterday in respiratory distress admitted following witnessed cardiac arrest. Pt is S/P CPR. MRI with infarcts. PEG tube placed 5/24, tracheostomy 5/25. Pharmacy consulted for electrolyte monitoring and replacement.  PROSource 45 ml bid Nepro 60 ml/hr Free water 250 mL q4h  MIVF: lactated ringers at 40 mL/hr  Goal of Therapy:  Electrolytes WNL  Plan:   On Lokelma 10 mg per tube daily and potassium has been stable  Hypernatremia improving on free water   F/u electrolytes with am labs  Pricilla Riffle, PharmD Clinical Pharmacist 01/18/2021 11:57 AM

## 2021-01-18 NOTE — Progress Notes (Signed)
Occupational Therapy Treatment Patient Details Name: Jeremy Browning MRN: [id number] DOB: 1961-02-01 Today's Date: 01/18/2021    History of present illness Pt is a 60 yo male that presented from home with respiratory distress 12/25/2020. Underwent defibrillation and CPR, emergently intubated. During admission pt has had a cardiac cath, peg tube and tracheostomy placed, weaned from vent 5/26 AM. 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 Mildly restricted diffusion involving the cortex more diffusely over the right cerebral convexity favored to reflect an acute MCA territory infarct over global hypoxic injury given unilaterality".   OT comments  Jeremy Browning was seen for OT treatment on this date. Upon arrival to room pt reclined in bed sleeping, awakes to touch. Pt required MAX A hand over hand face washing and head scratching at bed level - pt asssits by turning head, appears purposeful. Pt tolerated PROM to BUE at bed level, pt grimaces with elbow/digit extension at end ranges. TOTAL A don B resting hand splints. Pt appears to attempt to vocalize stating "hurts" and when asked about his family. Pt making progress toward goals. Pt continues to benefit from skilled OT services to maximize return to PLOF and minimize risk of future falls, injury, caregiver burden, and readmission. Will continue to follow POC. Discharge recommendation remains appropriate.    Follow Up Recommendations  LTACH    Equipment Recommendations  Other (comment) (TBD)    Recommendations for Other Services      Precautions / Restrictions Precautions Precautions: Fall Precaution Comments: trach, PEG Restrictions Weight Bearing Restrictions: No              ADL either performed or assessed with clinical judgement   ADL Overall ADL's : Needs assistance/impaired                                       General ADL Comments: MAX A hand over hand face  washing and head scratching at bed level - pt asssits by turning head, appears purposeful. TOTAL A don B resting hand splints               Cognition Arousal/Alertness: Awake/alert Behavior During Therapy: Flat affect Overall Cognitive Status: Impaired/Different from baseline Area of Impairment: Rancho level               Rancho Levels of Cognitive Functioning Rancho Los Amigos Scales of Cognitive Functioning: Generalized response               General Comments: patient is unable to follow commands at this time        Exercises Exercises: Other exercises;General Upper Extremity General Exercises - Upper Extremity Shoulder Flexion: PROM;Both;20 reps;Supine Shoulder Extension: PROM;Both;20 reps;Supine Shoulder ABduction: PROM;Both;20 reps;Supine Shoulder ADduction: PROM;Both;20 reps;Supine Shoulder Horizontal ABduction: PROM;Both;20 reps;Supine Shoulder Horizontal ADduction: PROM;Both;20 reps;Supine Elbow Flexion: PROM;Both;20 reps;Supine Elbow Extension: PROM;Both;20 reps;Supine Wrist Flexion: PROM;Both;20 reps;Supine Wrist Extension: PROM;Both;20 reps;Supine Digit Composite Flexion: PROM;Both;20 reps;Supine Composite Extension: PROM;Both;15 reps;Supine Other Exercises Other Exercises: PROM to BUE at bed level, pt grimaces with elbow/digit extension at end ranges Other Exercises: Face washing, head scratching, don B resting hand splints           Pertinent Vitals/ Pain       Pain Assessment: Faces Faces Pain Scale: Hurts little more Pain Location: grimicing intermittently with ROM of extremities. unable to state pain level or location Pain Descriptors / Indicators:  Grimacing Pain Intervention(s): Limited activity within patient's tolerance;Repositioned         Frequency  Min 1X/week        Progress Toward Goals  OT Goals(current goals can now be found in the care plan section)  Progress towards OT goals: Progressing toward goals  Acute Rehab  OT Goals Patient Stated Goal: none stated OT Goal Formulation: Patient unable to participate in goal setting Time For Goal Achievement: 01/27/21 Potential to Achieve Goals: Fair ADL Goals Additional ADL Goal #1: Pt's care team will demo competency in b/l resting hand splint wear schedule and donning/doffing with no cues Additional ADL Goal #2: Pt's care team will demonstrate competency in positioning recommendations for b/l UE edema reduction with no verbal cues. Additional ADL Goal #3: Pt will experience at least 30% reduction of b/l UE edema through manual techniques as evidenced by no pitting in hands.  Plan Discharge plan remains appropriate;Frequency needs to be updated       AM-PAC OT "6 Clicks" Daily Activity     Outcome Measure   Help from another person eating meals?: Total Help from another person taking care of personal grooming?: Total Help from another person toileting, which includes using toliet, bedpan, or urinal?: Total Help from another person bathing (including washing, rinsing, drying)?: Total Help from another person to put on and taking off regular upper body clothing?: Total Help from another person to put on and taking off regular lower body clothing?: Total 6 Click Score: 6    End of Session    OT Visit Diagnosis: Other symptoms and signs involving the nervous system (R29.898);Other symptoms and signs involving cognitive function   Activity Tolerance Treatment limited secondary to medical complications (Comment)   Patient Left in bed;with bed alarm set;with nursing/sitter in room   Nurse Communication Mobility status        Time: 7001-7494 OT Time Calculation (min): 30 min  Charges: OT General Charges $OT Visit: 1 Visit OT Treatments $Self Care/Home Management : 8-22 mins $Therapeutic Exercise: 8-22 mins  Kathie Dike, M.S. OTR/L  01/18/21, 4:35 PM  ascom [phone]

## 2021-01-18 NOTE — Progress Notes (Signed)
Better day per PT,OT and yesterdays nurse. Patient more interactive and follows a few simple commands. Working well with PT and OT.Right leg withdraws quickly to any stimulation. Patient pulls away with arms and right leg from any ROM exercises. Head motion not as stiff per PT. Remains in NSR. No family today yet to visit.

## 2021-01-18 NOTE — Progress Notes (Addendum)
Physical Therapy Treatment Patient Details Name: Jeremy Browning MRN: [id number] DOB: 03/09/1961 Today's Date: 01/18/2021    History of Present Illness Pt is a 60 yo male that presented from home with respiratory distress 12/25/2020. Underwent defibrillation and CPR, emergently intubated. During admission pt has had a cardiac cath, peg tube and tracheostomy placed, weaned from vent 5/26 AM. 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 Mildly restricted diffusion involving the cortex more diffusely over the right cerebral convexity favored to reflect an acute MCA territory infarct over global hypoxic injury given unilaterality".    PT Comments    Stimulation provided with components from JKF Coma Recovery Scale. Patient demonstrated auditory startle once but was not reproducible. Patient does demonstrate visual pursuit (with both visual and auditory stimulation provided) consistnetly with right eye where as left pupil appears fixated. Patient has localization reponse to noxious stimuli to nail bed to all extremities with flexion withdrawal. Patient also has spontaneous movement of all extremities, especially with LUE. Patient does move BLE to light touch as well. No oral reflexive movement was observed and no attempts noted at communication. Patient was awake with eyes opened during the entire session with initial eye opening after auditory stimulation. Patient is currently unable to follow commands. PROM provided to extremities with hypertonicity of RLE and grimacing at times with facilitation for knee extension. No change in vitals noted with stimulation and upright positioning with placing bed in semi-chair position with head of bed at 60 degrees.  He continues to require total assistance with mobility efforts. Consistent with RLAS II. Recommend to continue PT to maximize independence and decrease caregiver burden with LTACH recommended at discharge.     Follow Up Recommendations  LTACH     Equipment Recommendations  None recommended by PT    Recommendations for Other Services       Precautions / Restrictions Precautions Precautions: Fall Precaution Comments: trach, PEG Restrictions Weight Bearing Restrictions: No    Mobility  Bed Mobility               General bed mobility comments: patient continues to require total assistance for all mobility at this time. bed placed in semi-chair position with head of bed at 60 degrees with no change in vitals noted. patient alert with eyes opened throughout    Transfers                    Ambulation/Gait                 Stairs             Wheelchair Mobility    Modified Rankin (Stroke Patients Only)       Balance                                            Cognition     Overall Cognitive Status: Impaired/Different from baseline Area of Impairment: Rancho level               Rancho Levels of Cognitive Functioning Rancho Los Amigos Scales of Cognitive Functioning: Generalized response               General Comments: patient is unable to follow commands at this time      Exercises Other Exercises Other Exercises: PROM provided to all extremities. hypertonicity noted in  RLE with PROM, unable to achieve full knee extension with grimicaing with ROM efforts. spontanous movement noted in all extremities as well as withdrawal to painful stimuli    General Comments        Pertinent Vitals/Pain Pain Location: grimicing intermittently with ROM of extremities. unable to state pain level or location    Home Living                      Prior Function            PT Goals (current goals can now be found in the care plan section) Acute Rehab PT Goals Patient Stated Goal: none stated PT Goal Formulation: Patient unable to participate in goal setting Time For Goal Achievement: 01/27/21 Potential to Achieve  Goals: Poor Progress towards PT goals: Progressing toward goals    Frequency    Min 2X/week      PT Plan Current plan remains appropriate    Co-evaluation              AM-PAC PT "6 Clicks" Mobility   Outcome Measure  Help needed turning from your back to your side while in a flat bed without using bedrails?: Total Help needed moving from lying on your back to sitting on the side of a flat bed without using bedrails?: Total Help needed moving to and from a bed to a chair (including a wheelchair)?: Total Help needed standing up from a chair using your arms (e.g., wheelchair or bedside chair)?: Total Help needed to walk in hospital room?: Total Help needed climbing 3-5 steps with a railing? : Total 6 Click Score: 6    End of Session Equipment Utilized During Treatment:  (trach collar oxygen) Activity Tolerance: Patient tolerated treatment well Patient left: in bed;with bed alarm set Nurse Communication: Mobility status PT Visit Diagnosis: Other abnormalities of gait and mobility (R26.89);Muscle weakness (generalized) (M62.81);Other symptoms and signs involving the nervous system (R29.898)     Time: 1610-9604 PT Time Calculation (min) (ACUTE ONLY): 38 min  Charges:  $Therapeutic Activity: 38-52 mins                     Donna Bernard, PT, MPT    [name] 01/18/2021, 11:48 AM

## 2021-01-19 LAB — BASIC METABOLIC PANEL
Anion gap: 7 (ref 5–15)
BUN: 44 mg/dL — ABNORMAL HIGH (ref 6–20)
CO2: 23 mmol/L (ref 22–32)
Calcium: 8.9 mg/dL (ref 8.9–10.3)
Chloride: 113 mmol/L — ABNORMAL HIGH (ref 98–111)
Creatinine, Ser: 1.4 mg/dL — ABNORMAL HIGH (ref 0.61–1.24)
GFR, Estimated: 58 mL/min — ABNORMAL LOW (ref >60)
Glucose, Bld: 165 mg/dL — ABNORMAL HIGH (ref 70–99)
Potassium: 3.7 mmol/L (ref 3.5–5.1)
Sodium: 143 mmol/L (ref 135–145)

## 2021-01-19 LAB — GLUCOSE, CAPILLARY
Glucose-Capillary: 147 mg/dL — ABNORMAL HIGH (ref 70–99)
Glucose-Capillary: 138 mg/dL — ABNORMAL HIGH (ref 70–99)
Glucose-Capillary: 146 mg/dL — ABNORMAL HIGH (ref 70–99)
Glucose-Capillary: 146 mg/dL — ABNORMAL HIGH (ref 70–99)
Glucose-Capillary: 142 mg/dL — ABNORMAL HIGH (ref 70–99)
Glucose-Capillary: 145 mg/dL — ABNORMAL HIGH (ref 70–99)
Glucose-Capillary: 158 mg/dL — ABNORMAL HIGH (ref 70–99)

## 2021-01-19 LAB — PHOSPHORUS: Phosphorus: 4 mg/dL (ref 2.5–4.6)

## 2021-01-19 LAB — MAGNESIUM: Magnesium: 2.1 mg/dL (ref 1.7–2.4)

## 2021-01-19 MED ORDER — OXYCODONE HCL 5 MG PO TABS
5.0000 mg | ORAL_TABLET | Freq: Four times a day (QID) | ORAL | Status: DC
  Administered 2021-01-19 – 2021-01-20 (×3): 5 mg
  Filled 2021-01-19 (×3): qty 1

## 2021-01-19 NOTE — TOC Progression Note (Signed)
Transition of Care [facility]) - Progression Note    Patient Details  Name: Jeremy Browning MRN: [id number] Date of Birth: 1961-03-01  Transition of [facility]) CM/SW Contact  Marina Goodell Phone Number: [phone] 01/19/2021, 2:56 PM  Clinical Narrative:     During multidisciplinary rounds, it was discussed patient has made inconsistent purposeful movements with eyes and responded to questions, by nodding or shaking his head.  CSW discuss with [facility] in [place], to see if patient will meet placement criteria.  Britt Boozer stated she would update this CSW when she had a reply.   Expected Discharge Plan: Skilled Nursing Facility Barriers to Discharge: Continued Medical Work up  Expected Discharge Plan and Services Expected Discharge Plan: Skilled Nursing Facility In-house Referral: Clinical Social Work   Post Acute Care Choice: Skilled Nursing Facility                                         Social Determinants of Health (SDOH) Interventions    Readmission Risk Interventions No flowsheet data found.

## 2021-01-19 NOTE — Progress Notes (Signed)
PHARMACY CONSULT NOTE  Pharmacy Consult for Electrolyte Monitoring and Replacement   Recent Labs: Potassium (mmol/L)  Date Value  01/19/2021 3.7   Magnesium (mg/dL)  Date Value  67/59/1638 2.1   Calcium (mg/dL)  Date Value  46/65/9935 8.9   Albumin (g/dL)  Date Value  70/17/7939 2.4 (L)   Phosphorus (mg/dL)  Date Value  03/00/9233 4.0   Sodium (mmol/L)  Date Value  01/19/2021 143    Assessment:  60 year old male with obesity, diabetes mellitus, hypertension, and combined diastolic and systolic heart failure who presented to the emergency department yesterday in respiratory distress admitted following witnessed cardiac arrest. Pt is S/P CPR. MRI with infarcts. PEG tube placed 5/24, tracheostomy 5/25. Pharmacy consulted for electrolyte monitoring and replacement.  PROSource 45 ml bid Nepro 60 ml/hr Free water 250 mL q4h  Goal of Therapy:  Electrolytes WNL  Plan:   Lokelma discontinued for stable potassium now trending normal  Hypernatremia improved on free water   Discontinue LR  F/u electrolytes as ordered by providers  Pricilla Riffle, PharmD Clinical Pharmacist 01/19/2021 11:51 AM

## 2021-01-19 NOTE — Plan of Care (Signed)
  Problem: Clinical Measurements: Goal: Respiratory complications will improve Outcome: Progressing   Problem: Nutrition: Goal: Adequate nutrition will be maintained Outcome: Progressing   Problem: Activity: Goal: Ability to tolerate increased activity will improve Outcome: Progressing

## 2021-01-20 DIAGNOSIS — J9602 Acute respiratory failure with hypercapnia: Secondary | ICD-10-CM | POA: Diagnosis not present

## 2021-01-20 DIAGNOSIS — J9601 Acute respiratory failure with hypoxia: Secondary | ICD-10-CM | POA: Diagnosis not present

## 2021-01-20 DIAGNOSIS — I469 Cardiac arrest, cause unspecified: Secondary | ICD-10-CM | POA: Diagnosis not present

## 2021-01-20 LAB — GLUCOSE, CAPILLARY
Glucose-Capillary: 136 mg/dL — ABNORMAL HIGH (ref 70–99)
Glucose-Capillary: 149 mg/dL — ABNORMAL HIGH (ref 70–99)
Glucose-Capillary: 164 mg/dL — ABNORMAL HIGH (ref 70–99)
Glucose-Capillary: 147 mg/dL — ABNORMAL HIGH (ref 70–99)
Glucose-Capillary: 156 mg/dL — ABNORMAL HIGH (ref 70–99)
Glucose-Capillary: 151 mg/dL — ABNORMAL HIGH (ref 70–99)

## 2021-01-20 LAB — BASIC METABOLIC PANEL
Anion gap: 8 (ref 5–15)
BUN: 42 mg/dL — ABNORMAL HIGH (ref 6–20)
CO2: 24 mmol/L (ref 22–32)
Calcium: 8.9 mg/dL (ref 8.9–10.3)
Chloride: 113 mmol/L — ABNORMAL HIGH (ref 98–111)
Creatinine, Ser: 1.39 mg/dL — ABNORMAL HIGH (ref 0.61–1.24)
GFR, Estimated: 58 mL/min — ABNORMAL LOW (ref >60)
Glucose, Bld: 147 mg/dL — ABNORMAL HIGH (ref 70–99)
Potassium: 4.2 mmol/L (ref 3.5–5.1)
Sodium: 145 mmol/L (ref 135–145)

## 2021-01-20 LAB — PHOSPHORUS: Phosphorus: 4.8 mg/dL — ABNORMAL HIGH (ref 2.5–4.6)

## 2021-01-20 LAB — MAGNESIUM: Magnesium: 2.3 mg/dL (ref 1.7–2.4)

## 2021-01-20 MED ORDER — OXYCODONE HCL 5 MG PO TABS
5.0000 mg | ORAL_TABLET | Freq: Four times a day (QID) | ORAL | Status: DC | PRN
  Administered 2021-01-20 – 2021-02-08 (×15): 5 mg
  Filled 2021-01-20 (×16): qty 1

## 2021-01-20 NOTE — Progress Notes (Signed)
Physical Therapy Treatment Patient Details Name: Jeremy Browning MRN: [id number] DOB: 1961/06/30 Today's Date: 01/20/2021    History of Present Illness Pt is a 60 yo male that presented from home with respiratory distress 12/25/2020. Underwent defibrillation and CPR, emergently intubated. During admission pt has had a cardiac cath, peg tube and tracheostomy placed, weaned from vent 5/26 AM. 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 Mildly restricted diffusion involving the cortex more diffusely over the right cerebral convexity favored to reflect an acute MCA territory infarct over global hypoxic injury given unilaterality".    PT Comments    Patient is making progress towards functional goals. He is progressing from RLAS II into RLAS III. Patient was able to follow 2 commands this session, nodding head and moving right leg when asked. Patient was attempting to communicate by nodding. Total assistance +2 person required for bed mobility.  Patient sat up on edge of bed for ~ 15 minutes today with total assistance. No significant change in vitals noted with upright activity and patient appears to have no distress in the upright sitting position. No protective righting reactions noted in sitting position with loss of balance in all directions. Patient has a right gaze preference but can look towards left with auditory and visual stimuli. Goals have been updated accordingly. Recommend LTACH placement at discharge. Patient needs continued stimulation and mobility efforts.     Follow Up Recommendations  LTACH     Equipment Recommendations  None recommended by PT    Recommendations for Other Services       Precautions / Restrictions Precautions Precautions: Fall Precaution Comments: trach, PEG Restrictions Weight Bearing Restrictions: No    Mobility  Bed Mobility Overal bed mobility: Needs Assistance Bed Mobility: Supine to Sit;Sit to Supine      Supine to sit: Total assist;+2 for physical assistance Sit to supine: Total assist;+2 for physical assistance   General bed mobility comments: patient required total assistance +2 person for bed mobility. assistance for LE and trunk support. verbal cues for task initiation    Transfers                    Ambulation/Gait                 Stairs             Wheelchair Mobility    Modified Rankin (Stroke Patients Only)       Balance Overall balance assessment: Needs assistance Sitting-balance support: Feet unsupported;Bilateral upper extremity supported Sitting balance-Leahy Scale: Zero Sitting balance - Comments: patient has no protective righting reactions noted. no significant change in Sp02 or heart rate noted with activity Postural control:  (loss of balance in all directions but primarily left and posteriorly)                                  Cognition Arousal/Alertness: Awake/alert Behavior During Therapy: Flat affect   Area of Impairment: Rancho level               Rancho Levels of Cognitive Functioning Rancho Los Amigos Scales of Cognitive Functioning: Generalized response (patient also meeting some components of Level III, however is most consistent with Level II)                      Exercises General Exercises - Lower Extremity Ankle Circles/Pumps: AAROM;Strengthening;Both;10 reps;Seated;PROM  Long Arc Quad: PROM;5 reps;Seated Other Exercises Other Exercises: patient does participate with right LE ankle pumps to command. facilitation provided for active movement of BLE, however mostly PROM as patient unable to follow commands consistently    General Comments        Pertinent Vitals/Pain Pain Location: grimicing intermittently with mobility efforts    Home Living                      Prior Function            PT Goals (current goals can now be found in the care plan section) Acute Rehab PT  Goals Patient Stated Goal: none stated PT Goal Formulation: Patient unable to participate in goal setting Time For Goal Achievement: 01/27/21 Potential to Achieve Goals: Poor Progress towards PT goals: Progressing toward goals    Frequency    Min 2X/week      PT Plan Current plan remains appropriate    Co-evaluation PT/OT/SLP Co-Evaluation/Treatment: Yes Reason for Co-Treatment: Complexity of the patient's impairments (multi-system involvement) PT goals addressed during session: Mobility/safety with mobility;Strengthening/ROM;Balance        AM-PAC PT "6 Clicks" Mobility   Outcome Measure  Help needed turning from your back to your side while in a flat bed without using bedrails?: Total Help needed moving from lying on your back to sitting on the side of a flat bed without using bedrails?: Total Help needed moving to and from a bed to a chair (including a wheelchair)?: Total Help needed standing up from a chair using your arms (e.g., wheelchair or bedside chair)?: Total Help needed to walk in hospital room?: Total Help needed climbing 3-5 steps with a railing? : Total 6 Click Score: 6    End of Session Equipment Utilized During Treatment: Oxygen (trach collar) Activity Tolerance: Patient tolerated treatment well Patient left: in bed;with call bell/phone within reach Nurse Communication: Mobility status PT Visit Diagnosis: Other abnormalities of gait and mobility (R26.89);Muscle weakness (generalized) (M62.81);Other symptoms and signs involving the nervous system (R29.898)     Time: 6160-7371 PT Time Calculation (min) (ACUTE ONLY): 33 min  Charges:  $Therapeutic Activity: 8-22 mins                     Donna Bernard, PT, MPT    [name] 01/20/2021, 12:49 PM

## 2021-01-20 NOTE — Progress Notes (Signed)
PHARMACY CONSULT NOTE  Pharmacy Consult for Electrolyte Monitoring and Replacement   Recent Labs: Potassium (mmol/L)  Date Value  01/20/2021 4.2   Magnesium (mg/dL)  Date Value  29/79/8921 2.3   Calcium (mg/dL)  Date Value  19/41/7408 8.9   Albumin (g/dL)  Date Value  14/48/1856 2.4 (L)   Phosphorus (mg/dL)  Date Value  31/49/7026 4.8 (H)   Sodium (mmol/L)  Date Value  01/20/2021 145    Assessment:  60 year old male with obesity, diabetes mellitus, hypertension, and combined diastolic and systolic heart failure who presented to the emergency department yesterday in respiratory distress admitted following witnessed cardiac arrest. Pt is S/P CPR. MRI with infarcts. PEG tube placed 5/24, tracheostomy 5/25. Pharmacy consulted for electrolyte monitoring and replacement.  PROSource 45 ml bid Nepro 60 ml/hr Free water 250 mL q4h (1.5 L/day)  Goal of Therapy:  Electrolytes WNL  Plan:   No electrolyte replacement warranted at this time  F/u electrolytes as ordered by providers  Tressie Ellis 01/20/2021 7:27 AM

## 2021-01-20 NOTE — Progress Notes (Signed)
[facility] hospitalist pickup  an unfortunate 60 yo patient s/p trach and PEG tube for acute cardiac arrest with severe brain damage. he is not on vent last 4 days, no pressors, does not communicate, tolerating feeds, he is not on much meds, no infusions   [facility] to assume primary care of this patient 6/3  Signout received via secure chat from PCCM attending  Lolita Patella MD

## 2021-01-20 NOTE — Progress Notes (Signed)
Nutrition Follow Up Note   DOCUMENTATION CODES:  Obesity unspecified  INTERVENTION:   Continue Nepro 1.8 _0 /h  Pro-Source 29m BID via tube  Free water flushes 2527mq4 hours   Regimen provides 2672kcal/day, 139g/day protein and 254746may free water   NUTRITION DIAGNOSIS:  Inadequate oral intake related to inability to eat (pt sedated and ventilated) as evidenced by NPO status.  GOAL:  Provide needs based on ASPEN/SCCM guidelines  -met with tube feeds   MONITOR:  Vent status,Labs,Weight trends,TF tolerance,Skin,I & O's  ASSESSMENT:  [age]o. black male with diabetes mellitus type II, hypertension and congestive heart failure who [facility] for cardiogenic shock and flash pulmonary edema  Pt s/p PEG tube 5/24  Pt s/p tracheostomy 5/25  Pt working with PT at the time of assessment. Discussed in IDT rounds, RN and provider state that pt has made significant improvement over the last few days in his ability to follow commands and make some purposeful movement. TF infusing at goal rate. LTACH to re-evaluate to determine if patient is appropriate for placement.    Intake/Output Summary (Last 24 hours) at 01/20/2021 1434 Last data filed at 01/20/2021 1400 Gross per 24 hour  Intake 3420 ml  Output 1750 ml  Net 1670 ml  Net IO Since Admission: -4,813.08 mL [01/20/21 1434]  Nutritionally Relevant Medications: Scheduled Meds: . atorvastatin  80 mg Per Tube Daily  . docusate  100 mg Per Tube BID  . famotidine  20 mg Per Tube Daily  . polyethylene glycol  17 g Per Tube Daily   PRN Meds: docusate, polyethylene glycol  Labs reviewed:   BUN 42, Creatinine 1.39  Phosphorus 4.8  SBG ranges from 136-164 mg/dL over the last 24 hours  HgbA1c 5.9%, (5/7)  Diet Order:   Diet Order            Diet NPO time specified  Diet effective now                EDUCATION NEEDS:  No education needs have been identified at this time  Skin:  Skin Assessment:  Reviewed RN Assessment  Last BM:  6/1 - per RN documentation  Height:  Ht Readings from Last 1 Encounters:  01/15/21 6' 0.76" (1.848 m)    Weight:  Wt Readings from Last 1 Encounters:  01/20/21 118.1 kg    BMI:  Body mass index is 34.58 kg/m.  Estimated Nutritional Needs:   Kcal:  2500-2800kcal/day  Protein:  125-140g/day  Fluid:  2.4-2.7L/day  RacRanell PatrickD, LDN Clinical Dietitian Pager on Amion

## 2021-01-20 NOTE — Progress Notes (Signed)
Occupational Therapy Treatment Jeremy Browning Details Name: Jeremy Browning MRN: [id number] DOB: 10/11/1960 Today's Date: 01/20/2021    History of present illness Jeremy Browning is a 60 yo male that presented from home with respiratory distress 12/25/2020. Underwent defibrillation and CPR, emergently intubated. During admission Jeremy Browning has had a cardiac cath, peg tube and tracheostomy placed, weaned from vent 5/26 AM. 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 Mildly restricted diffusion involving the cortex more diffusely over the right cerebral convexity favored to reflect an acute MCA territory infarct over global hypoxic injury given unilaterality".   OT comments  Jeremy Browning was seen for OT/Jeremy Browning co- treatment on this date. Jeremy Browning requires TOTAL A x2 sup<>sit, tolerates ~15 mins sitting EOB. MAX A hand over hand head scratching - Jeremy Browning assists by turning head. TOTAL A don/doff B prafo boots and resting hand splints at bed level. TOTAL A x2 for toileting bed level. Jeremy Browning demonstrated emerging RLAS III including 1 step command following with increased time - responds to questions with head shakes/nods and flexes R foot on command. Jeremy Browning making good progress toward goals. Jeremy Browning continues to benefit from skilled OT services to maximize return to PLOF and minimize risk of future falls, injury, caregiver burden, and readmission. Will continue to follow POC. Discharge recommendation remains appropriate.    Follow Up Recommendations  LTACH    Equipment Recommendations  Other (comment) (TBD)    Recommendations for Other Services      Precautions / Restrictions Precautions Precautions: Fall Precaution Comments: trach, PEG Restrictions Weight Bearing Restrictions: No       Mobility Bed Mobility Overal bed mobility: Needs Assistance Bed Mobility: Supine to Sit;Sit to Supine     Supine to sit: Total assist;+2 for physical assistance Sit to supine: Total assist;+2 for physical assistance    General bed mobility comments: Jeremy Browning required total assistance +2 person for bed mobility. assistance for LE and trunk support. verbal cues for task initiation                 Balance Overall balance assessment: Needs assistance Sitting-balance support: Feet unsupported;Bilateral upper extremity supported Sitting balance-Leahy Scale: Zero Sitting balance - Comments: Jeremy Browning has no protective righting reactions noted. no significant change in Sp02 or heart rate noted with activity Postural control:  (loss of balance in all directions but primarily left and posteriorly)                                 ADL either performed or assessed with clinical judgement   ADL Overall ADL's : Needs assistance/impaired                                       General ADL Comments: MAX A hand over hand head scratching - Jeremy Browning assists by turning head. TOTAL A don/doff B prafo boots and resting hand splints at bed level. TOTAL A x2 for toileting bed level.               Cognition Arousal/Alertness: Awake/alert Behavior During Therapy: Flat affect   Area of Impairment: Rancho level               Rancho Levels of Cognitive Functioning Rancho Los Amigos Scales of Cognitive Functioning: Generalized response (emerging Level III)  General Comments: demonstrated 1 step command following with increased time - responds to questions with head shakes/nods and flexes R foot on command        Exercises Exercises: Other exercises General Exercises - Lower Extremity Ankle Circles/Pumps: AAROM;Strengthening;Both;10 reps;Seated;PROM Long Arc Quad: PROM;5 reps;Seated Other Exercises Other Exercises: PROM of BUE. Don/doff B prafo boots nad don B resting hand splints. sup<>sit, sitting balance/tolerance, toileting, rolling           Pertinent Vitals/ Pain       Pain Assessment: Faces Faces Pain Scale: Hurts a little bit Pain Location: grimicing  intermittently with mobility efforts Pain Descriptors / Indicators: Grimacing Pain Intervention(s): Limited activity within Jeremy Browning's tolerance;Repositioned         Frequency  Min 1X/week        Progress Toward Goals  OT Goals(current goals can now be found in the care plan section)  Progress towards OT goals: Progressing toward goals  Acute Rehab OT Goals Jeremy Browning Stated Goal: none stated OT Goal Formulation: Jeremy Browning unable to participate in goal setting Time For Goal Achievement: 01/27/21 Potential to Achieve Goals: Fair ADL Goals Additional ADL Goal #1: Jeremy Browning's care team will demo competency in b/l resting hand splint wear schedule and donning/doffing with no cues Additional ADL Goal #2: Jeremy Browning's care team will demonstrate competency in positioning recommendations for b/l UE edema reduction with no verbal cues. Additional ADL Goal #3: Jeremy Browning will experience at least 30% reduction of b/l UE edema through manual techniques as evidenced by no pitting in hands.  Plan Discharge plan remains appropriate;Frequency needs to be updated    Co-evaluation    Jeremy Browning/OT/SLP Co-Evaluation/Treatment: Yes Reason for Co-Treatment: Complexity of the Jeremy Browning's impairments (multi-system involvement);To address functional/ADL transfers Jeremy Browning goals addressed during session: Mobility/safety with mobility OT goals addressed during session: ADL's and self-care      AM-PAC OT "6 Clicks" Daily Activity     Outcome Measure   Help from another person eating meals?: Total Help from another person taking care of personal grooming?: A Lot Help from another person toileting, which includes using toliet, bedpan, or urinal?: Total Help from another person bathing (including washing, rinsing, drying)?: Total Help from another person to put on and taking off regular upper body clothing?: Total Help from another person to put on and taking off regular lower body clothing?: Total 6 Click Score: 7    End of Session     OT Visit Diagnosis: Other symptoms and signs involving the nervous system (R29.898);Other symptoms and signs involving cognitive function   Activity Tolerance Jeremy Browning tolerated treatment well   Jeremy Browning Left in bed;with call bell/phone within reach;with bed alarm set   Nurse Communication Mobility status        Time: 1017-1050 OT Time Calculation (min): 33 min  Charges: OT General Charges $OT Visit: 1 Visit OT Treatments $Self Care/Home Management : 8-22 mins  Kathie Dike, M.S. OTR/L  01/20/21, 2:19 PM  ascom [phone]

## 2021-01-20 NOTE — Progress Notes (Signed)
NAME:  Jeremy Browning, MRN:  [id number], DOB:  [birth year], LOS: 71 ADMISSION DATE:  12/25/2020    Brief Pt Description / Synopsis:  60 yo morbidly obese AAM with cardiac arrest,  NSTEMI with acute severe systolic cardiac failure, anoxic encephalopathy and CVA, and Proteus Pneumonia.  Status post Tracheostomy placement on 01/12/21.   History of Present Ilness:   60 yo M presenting to [facility] ED from home via EMS with respiratory distress that had been progressively worsening over the course of 12/25/20. Per documentation family stated the patient was in his normal state of health until this morning when he began feeling unwell. He then called out stating he could not breathe. EMS found the patient hypoxic and in distress with SpO2 in the 70's on room air. Patient was place on CPAP, arriving to the ED unresponsive and pulseless. ED course: Upon arrival to ED patient was defibrillated x 1 and received CPR before achieving ROSC. Initial EKG suggestive of global ischemia, second EKG had possible LBBB vs IVCD. Patient was emergently intubated requiring mechanical ventilation. Per ED documentation STEMI team activated in the setting of a witnessed shockable cardiac arrest with clinical exam highly suggestive of pulmonary edema with pink frothy sputum throughout ET tubing.  Pertinent Medical History  Obesity Hypertension Diabetes mellitus Chronic renal insufficiency Combined systolic and diastolic heart failure   Micro Data:  12/25/2020: SARS-CoV-2 and influenza PCR>> negative 12/25/2020: Blood culture (aerobic only)>>STAPHYLOCOCCUS HOMINIS (? Contaminant) 12/25/2020: Repeat blood culture>> no growth 12/25/2020: Strep pneumo urinary antigen>> negative 12/25/2020: Legionella urinary antigen>> negative 12/25/2020: MRSA PCR>> negative 12/26/2020: HIV screen>> nonreactive 12/26/2020: Hepatitis B>> nonreactive 12/26/2020: Tracheal aspirate>> normal respiratory flora 01/04/2021: Tracheal aspirate>>PROTEUS MIRABILIS  (pansensitive)  Antimicrobials:  Unasyn 5/7>> 5/11 Doxycycline 5/7>> 5/9 Ceftriaxone 5/19>> 5/26  Significant Hospital Events: Including procedures, antibiotic start and stop dates in addition to other pertinent events   Cardiac catheterization on presentation with normal coronaries, good cardiac output, elevated pulmonary capillary wedge pressure.  5/7 severe resp failure 5/8remains on Vent 5/9remains on vent , multiorgan failureMRI shows brain injury 5/10remains on vent, multiorgan failure, brother updated 5/11severe resp failure 5/37filed weaning trials due to severe brain damage 5/118fled weaning trials severe hypoxia dn vent dyssynchrony, sister updated 5/14vent support, severe brain damage, prognosis is poor 5/16unresponsive on wake up assessment, becomes tachypneic when sedation is lightened 5/17no purposeful movement on wake up assessment, becomes tachypneic, asynchronous with vent 5/18remains critically ill, severe brain damage, patient made DNR 5/2065fly decided on TRACH/PEG tube 5/23severe brain damage, no further imaging needed 5/24 severe brain damage, [facility] s/p trach PEG tube 01/14/21- no changes today, awaitingj permanent placement  01/16/21- patient is stable without changes today. Plan for LTAC when able 01/17/21-met with family at bedside AngLevada Dyd SheFreda Munroisters of patient) they are hopeful for a recovery and appreciative of care. Questions answered medical plan reviewed.  01/18/21- patient stable on trache #8 - 28% FiO2, respond to pain with eye opening, awaiting LTACH assignment.  PiCC+. Reviewed plan with family including brother, SIL and two sisters.  01/20/21: No acute events, continues to tolerate Trach collar @ 28% FiO2, PT to work with pt, awaiting LTAC placement  Antibiotics Given (last 72 hours)    None        Interim History / Subjective:  No acute events reported overnight Continues  to tolerate Trach collar Afebrile, hemodynamically stable, no pressors Able to follow simple commands with RUE, withdraws from pain BLE, LUE remains  flaccid To work with PT today Awaiting LTAC placeement  Objective   Blood pressure (!) 158/54, pulse 72, temperature 98.5 F (36.9 C), temperature source Oral, resp. rate (!) 27, height 6' 0.76" (1.848 m), weight 118.1 kg, SpO2 100 %.    FiO2 (%):  [28 %] 28 %   Intake/Output Summary (Last 24 hours) at 01/20/2021 0849 Last data filed at 01/20/2021 0800 Gross per 24 hour  Intake 3060 ml  Output 1975 ml  Net 1085 ml   Filed Weights   01/18/21 0500 01/19/21 0357 01/20/21 0402  Weight: 118 kg 122.4 kg 118.1 kg      REVIEW OF SYSTEMS  PATIENT IS UNABLE TO PROVIDE COMPLETE REVIEW OF SYSTEMS DUE TO SEVERE CRITICAL ILLNESS  & S/p CVA and Tracheostomy   PHYSICAL EXAMINATION:  GENERAL:Acute on chronically ill appearing male, sitting in bed, on trach collar, in NAD HEAD: Normocephalic, atraumatic.  EYES: Pupils equal, round, reactive to light.  No scleral icterus.  MOUTH: Moist mucosal membrane. NECK: Supple, no JVD, s/p trach PULMONARY: Coarse breath sounds bilaterally, even, nonlabored CARDIOVASCULAR: S1 and S2. Regular rate and rhythm. No murmurs, rubs, or gallops.  GASTROINTESTINAL: Soft, nontender, -distended. Positive bowel sounds. S/p PEG tube which is clean, dry, intact MUSCULOSKELETAL: No swelling, clubbing, or edema.  NEUROLOGIC: Arouses to voice, tracks, able to follow commands to RUE, withdraws from pain to bilateral LE, LUE remains flaccid SKIN:intact,warm,dry   Labs/imaging that I havepersonally reviewed  (right click and "Reselect all SmartList Selections" daily)  Labs 01/20/21: glucose 147, BUN 42, Cr. 1.39     Assessment / Plan:    Acute Hypoxic Respiratory Failure in the setting of Cardiac arrest, Pulmonary edema and Proteus Pneumonia -S/p Tracheostomy placement on 01/12/21 -Supplemental O2 as needed to maintain  O2 sats >92% -Currently tolerating trach collar (has not required Vent since 01/16/21) -Follow intermittent CXR & ABG as needed -Completed course of Ceftriaxone -Prn Bronchodilators -Aggressive pulmonary toilet  Cardiac Arrest  (V-tach) NSTEMI Acute combined Systolic & Diastolic CHF (noniscehmic) Essential Hypertension -Continuous cardiac monitoring -Maintain MAP >65 -Cardiology work up complete ~ Ischemic workup up negative, recommend strict BP control -Continue ASA, statin, BB, Hydralazine -Echocardiogram with LVEF 45-50%  Anoxic Encephalopathy -Subacute infarcts - per MRI brain  -s/p Neuro eval- - "Given EEG which is not flatline or very low voltage, the patient's long-term neurological prognosis is likely to be better than originally anticipated.  - The cortical restricted diffusion seen on prior MRI may therefore also be reversible. This can be reassessed with a follow up MRI brain. If the patient is stable for MRI, would obtain today or tomorrow. Electronically signed: Dr. Kerney Elbe -Provide supportive care -PT/OT -Awaiting LTAC placement    PROTEUS PNEUMONIA>>treated -Monitor fever curve -Trend WBC's -Follow cultures as above -Completed course of Ceftriaxone  AKI (pre-renal azotemia) -Monitor I&O's / urinary output -Follow BMP -Ensure adequate renal perfusion -Avoid nephrotoxic agents as able -Replace electrolytes as indicated   Pt has tolerated Trach collar without needing ventilator since 01/16/21.  Hemodynamically stable, no pressors.  Will transfer service to [facility] on 01/21/21.  Please re-consult if any critical care needs arise or if we can be of further assistance.   Best practice (right click and "Reselect all SmartList Selections" daily)  Diet:NPO, Tube feeds Pain/Anxiety/Delirium protocol (if indicated):Yes(RASS goal 0) VAP protocol (if indicated):Yes DVT prophylaxis:Lovenox GI prophylaxis:H2B Glucose control:SSIYes Central venous  access:Yes, and it is still needed Foley:Yes, and it is still needed Mobility:bed rest PT consulted: yes DNR STATUS  Disposition:Stepdown    Labs   CBC: Recent Labs  Lab 01/14/21 0430 01/15/21 0520 01/16/21 0458 01/17/21 0440 01/18/21 0512  WBC 7.0 6.6 7.1 7.3 7.1  NEUTROABS 3.9 3.4  --   --   --   HGB 11.2* 10.5* 9.9* 9.6* 8.9*  HCT 37.6* 34.4* 32.6* 31.9* 30.0*  MCV 82.6 81.9 81.7 82.9 82.4  PLT 317 279 235 223 027    Basic Metabolic Panel: Recent Labs  Lab 01/16/21 0458 01/17/21 0440 01/18/21 0512 01/19/21 0502 01/20/21 0404  NA 149* 146* 145 143 145  K 4.1 4.1 4.0 3.7 4.2  CL 118* 114* 114* 113* 113*  CO2 23 22 21* 23 24  GLUCOSE 131* 159* 157* 165* 147*  BUN 66* 58* 47* 44* 42*  CREATININE 1.74* 1.42* 1.34* 1.40* 1.39*  CALCIUM 9.0 8.9 8.6* 8.9 8.9  MG 2.5* 2.1 1.9 2.1 2.3  PHOS 5.0* 4.0 3.5 4.0 4.8*   GFR: Estimated Creatinine Clearance: 75.8 mL/min (A) (by C-G formula based on SCr of 1.39 mg/dL (H)). Recent Labs  Lab 01/15/21 0520 01/16/21 0458 01/17/21 0440 01/18/21 0512  WBC 6.6 7.1 7.3 7.1    Liver Function Tests: No results for input(s): AST, ALT, ALKPHOS, BILITOT, PROT, ALBUMIN in the last 168 hours. No results for input(s): LIPASE, AMYLASE in the last 168 hours. No results for input(s): AMMONIA in the last 168 hours.  ABG    Component Value Date/Time   PHART 7.37 12/29/2020 1032   PCO2ART 35 12/29/2020 1032   PO2ART 81 (L) 12/29/2020 1032   HCO3 20.2 12/29/2020 1032   ACIDBASEDEF 4.5 (H) 12/29/2020 1032   O2SAT 95.5 12/29/2020 1032     Coagulation Profile: No results for input(s): INR, PROTIME in the last 168 hours.  Cardiac Enzymes: No results for input(s): CKTOTAL, CKMB, CKMBINDEX, TROPONINI in the last 168 hours.  HbA1C: Hgb A1c MFr Bld  Date/Time Value Ref Range Status  12/25/2020 04:46 PM 5.9 (H) 4.8 - 5.6 % Final    Comment:    (NOTE) Pre diabetes:          5.7%-6.4%  Diabetes:               >6.4%  Glycemic control for   <7.0% adults with diabetes     CBG: Recent Labs  Lab 01/19/21 1533 01/19/21 1932 01/19/21 2306 01/20/21 0321 01/20/21 0730  GLUCAP 142* 145* 158* 136* 149*    Allergies Not on File    Darel Hong, AGACNP-BC [facility] Prefer epic messenger for cross cover needs If after hours, please call E-link

## 2021-01-20 NOTE — Plan of Care (Signed)
  Problem: Clinical Measurements: Goal: Respiratory complications will improve Outcome: Progressing   Problem: Nutrition: Goal: Adequate nutrition will be maintained Outcome: Progressing   Problem: Elimination: Goal: Will not experience complications related to bowel motility Outcome: Progressing   Problem: Pain Managment: Goal: General experience of comfort will improve Outcome: Progressing

## 2021-01-21 LAB — BASIC METABOLIC PANEL
Anion gap: 8 (ref 5–15)
BUN: 40 mg/dL — ABNORMAL HIGH (ref 6–20)
CO2: 24 mmol/L (ref 22–32)
Calcium: 8.8 mg/dL — ABNORMAL LOW (ref 8.9–10.3)
Chloride: 110 mmol/L (ref 98–111)
Creatinine, Ser: 1.21 mg/dL (ref 0.61–1.24)
GFR, Estimated: >60 mL/min (ref >60)
Glucose, Bld: 143 mg/dL — ABNORMAL HIGH (ref 70–99)
Potassium: 4.2 mmol/L (ref 3.5–5.1)
Sodium: 142 mmol/L (ref 135–145)

## 2021-01-21 LAB — GLUCOSE, CAPILLARY
Glucose-Capillary: 103 mg/dL — ABNORMAL HIGH (ref 70–99)
Glucose-Capillary: 119 mg/dL — ABNORMAL HIGH (ref 70–99)
Glucose-Capillary: 138 mg/dL — ABNORMAL HIGH (ref 70–99)
Glucose-Capillary: 144 mg/dL — ABNORMAL HIGH (ref 70–99)
Glucose-Capillary: 146 mg/dL — ABNORMAL HIGH (ref 70–99)
Glucose-Capillary: 167 mg/dL — ABNORMAL HIGH (ref 70–99)

## 2021-01-21 LAB — MAGNESIUM: Magnesium: 2.1 mg/dL (ref 1.7–2.4)

## 2021-01-21 LAB — PHOSPHORUS: Phosphorus: 4.1 mg/dL (ref 2.5–4.6)

## 2021-01-21 NOTE — TOC Progression Note (Signed)
Transition of Care [facility]) - Progression Note    Patient Details  Name: Liandro Thelin MRN: [id number] Date of Birth: Oct 09, 1960  [facility]) CM/SW Contact  Marina Goodell Phone Number: [phone] 01/21/2021, 10:59 AM  Clinical Narrative:     PT/OT have recommended LTACH placement for patient.  Patient has made significant physical improvement in the last 48 hours.  [facility] is researching if patient will meet placement criteria. Jenn w/ [facility] will contact CSW with an update.  Expected Discharge Plan: Skilled Nursing Facility Barriers to Discharge: Continued Medical Work up  Expected Discharge Plan and Services Expected Discharge Plan: Skilled Nursing Facility In-house Referral: Clinical Social Work   Post Acute Care Choice: Skilled Nursing Facility                                         Social Determinants of Health (SDOH) Interventions    Readmission Risk Interventions No flowsheet data found.

## 2021-01-21 NOTE — Progress Notes (Signed)
PROGRESS NOTE  ICU transfer  Jeremy Browning  AYT:[id number] DOB: 08-31-1960 DOA: 12/25/2020 PCP: No primary care provider on file.   Brief Narrative: Taken from prior notes.  Prolonged hospitalization. Jeremy Browning is a 60 year old male with obesity, diabetes mellitus, hypertension, and combined diastolic and systolic heart failure who presented to the emergency department yesterday in respiratory distress.  In the emergency department he underwent emergent endotracheal intubation.  In the ER he also had an episode of ventricular tachycardia necessitating epinephrine x2 and cardioversion.  He was evaluated by the cardiologist and secondary to concern for acute coronary syndrome he was taken to the Cath Lab.  He was found to have patent coronary arteries with a cardiac index of approximately 2.8 and cardiac output of nearly 6.  Pulmonary capillary wedge pressure was approximately 30.  He presented hypertensive when in respiratory distress, but after intubation and sedation developed hypotension ultimately necessitating norepinephrine drip to help maintain adequate mean arterial pressure.    Patient has long complicated course on ventilator, concern of anoxic brain injury with cardiac arrest, trach was placed on 01/12/2021 and a PEG tube was placed on 01/11/2021.  Multiple family meetings but family remained very optimistic??  For recovery and wants everything needs to be done. Now able to weaned off from ventilator but continues to require 5 L of oxygen at 28% through trach. Remained pretty much unresponsive, just open eyes and not following any commands. PT is recommending LTAC-TOC is working on it. TRH to resume care on 01/21/2021  Significant events.  Copied from prior notes /7 severe resp failure 5/8remains on Vent 5/9remains on vent , multiorgan failureMRI shows brain injury 5/10remains on vent, multiorgan failure, brother updated 5/11severe resp failure 5/68filed weaning trials due to  severe brain damage 5/116fled weaning trials severe hypoxia dn vent dyssynchrony, sister updated 5/14vent support, severe brain damage, prognosis is poor 5/16unresponsive on wake up assessment, becomes tachypneic when sedation is lightened 5/17no purposeful movement on wake up assessment, becomes tachypneic, asynchronous with vent 5/18remains critically ill, severe brain damage, patient made DNR 5/2011fly decided on TRACH/PEG tube 5/23severe brain damage, no further imaging needed 5/24severe brain damage, PEG tube [facility] s/p trach PEG tube 01/14/21- no changes today, awaitingj permanent placement  01/16/21- patient is stable without changes today. Plan for LTAC when able 01/17/21-met with family at bedside AngLevada Dyd SheFreda Munroisters of patient) they are hopeful for a recovery and appreciative of care. Questions answered medical plan reviewed.  01/18/21- patient stable on trache #8 - 28% FiO2, respond to pain with eye opening, awaiting LTACH assignment.  PiCC+. Reviewed plan with family including brother, SIL and two sisters.  01/20/21: No acute events, continues to tolerate Trach collar @ 28% FiO2, PT to work with pt, awaiting LTAC placement  Subjective: Patient was seen and examined in ICU today.  Just open eyes when called his name, giving a blank stare and not following any other command.  No spontaneous limb movements noted.  Trach and PEG tube in place.  Assessment & Plan:   Active Problems:   Cardiogenic shock (HCC)   Cardiac arrest with successful resuscitation (HCC[place] Flash pulmonary edema ([place]CC[place] Cardiopulmonary arrest with successful resuscitation ([place]CC[place] Acute respiratory failure with hypoxia and hypercapnia (HCCHe[place] Acute kidney injury superimposed on CKD (HCCAp[place] Transaminitis   Second degree heart block   Lactic acidosis   Aspiration pneumonia (HCC)   HFrEF (heart failure with reduced ejection  fraction) (HCC)   Severe  hypoxic-ischemic encephalopathy   SOB (shortness of breath)   Oropharyngeal dysphagia  Acute Hypoxic Respiratory Failure in the setting of Cardiac arrest, Pulmonary edema and Proteus Pneumonia -S/p Tracheostomy placement on 01/12/21 -Supplemental O2 as needed to maintain O2 sats >92% -Currently tolerating trach collar (has not required Vent since 01/16/21) Completed a course of antibiotics with ceftriaxone and Unasyn. Continue to have significant trach secretions. -Continue with aggressive pulmonary toilet -Continue with as needed bronchodilators  Cardiac Arrest  (V-tach)/NSTEMI Acute combined Systolic & Diastolic CHF (noniscehmic)  Clinically stable.  Echocardiogram with EF of 45 to 50%. -Continue with aspirin, statin, beta-blocker.  Essential hypertension. -Continue with beta-blocker and hydralazine  Anoxic encephalopathy. Poor prognosis due to anoxic brain injury but family would like to continue current management. Very poor quality of life. According to 1 neurology note- Given EEG which is not flatline or very low voltage, the patient's long-term neurological prognosis is likely to be better than originally anticipated.  - The cortical restricted diffusion seen on prior MRI may therefore also be reversible. This can be reassessed with a follow up MRI brain. -PT is recommending LTAC-waiting for placement -We will ask neurology when to repeat MRI??  AKI.  Resolved  Nutrition.  S/p PEG tube -Continue with tube feed   Objective: Vitals:   01/21/21 1000 01/21/21 1100 01/21/21 1200 01/21/21 1300  BP: (!) 154/58 (!) 132/58 (!) 148/46 (!) 154/38  Pulse: 82 83 78 78  Resp: (!) 21 19 (!) 22 17  Temp:  98.4 F (36.9 C)    TempSrc:  Oral    SpO2: 100% 100% 100% 100%  Weight:      Height:        Intake/Output Summary (Last 24 hours) at 01/21/2021 1334 Last data filed at 01/21/2021 0600 Gross per 24 hour  Intake 600 ml  Output 1750 ml  Net -1150 ml   Filed Weights   01/19/21  0357 01/20/21 0402 01/21/21 0500  Weight: 122.4 kg 118.1 kg 118 kg    Examination:  General exam: Chronically ill-appearing gentleman, not following any commands, trach in place. Respiratory system: Harsh breathing sounds with transmitted upper respiratory secretions respiratory effort normal. Cardiovascular system: S1 & S2 heard, RRR.  Gastrointestinal system: Soft,nondistended, bowel sounds positive. Central nervous system: Just open eyes when called her name, no other response. Extremities: No edema, no cyanosis, pulses intact and symmetrical. Psychiatry: Judgement and insight appear impaired.   DVT prophylaxis: Lovenox Code Status: DNR Family Communication:  Disposition Plan:  Status is: Inpatient  Remains inpatient appropriate because:Inpatient level of care appropriate due to severity of illness   Dispo: The patient is from: Home              Anticipated d/c is to: LTAC              Patient currently is medically stable to d/c.   Difficult to place patient Yes              Level of care: Stepdown  All the records are reviewed and case discussed with Care Management/Social Worker. Management plans discussed with the patient, nursing and they are in agreement.  Consultants:   PCCM  Cardiology  Neurology  Procedures:  Antimicrobials:   Data Reviewed: I have personally reviewed following labs and imaging studies  CBC: Recent Labs  Lab 01/15/21 0520 01/16/21 0458 01/17/21 0440 01/18/21 0512  WBC 6.6 7.1 7.3 7.1  NEUTROABS 3.4  --   --   --  HGB 10.5* 9.9* 9.6* 8.9*  HCT 34.4* 32.6* 31.9* 30.0*  MCV 81.9 81.7 82.9 82.4  PLT 279 235 223 827   Basic Metabolic Panel: Recent Labs  Lab 01/17/21 0440 01/18/21 0512 01/19/21 0502 01/20/21 0404 01/21/21 0537  NA 146* 145 143 145 142  K 4.1 4.0 3.7 4.2 4.2  CL 114* 114* 113* 113* 110  CO2 22 21* '23 24 24  ' GLUCOSE 159* 157* 165* 147* 143*  BUN 58* 47* 44* 42* 40*  CREATININE 1.42* 1.34* 1.40* 1.39* 1.21   CALCIUM 8.9 8.6* 8.9 8.9 8.8*  MG 2.1 1.9 2.1 2.3 2.1  PHOS 4.0 3.5 4.0 4.8* 4.1   GFR: Estimated Creatinine Clearance: 87.1 mL/min (by C-G formula based on SCr of 1.21 mg/dL). Liver Function Tests: No results for input(s): AST, ALT, ALKPHOS, BILITOT, PROT, ALBUMIN in the last 168 hours. No results for input(s): LIPASE, AMYLASE in the last 168 hours. No results for input(s): AMMONIA in the last 168 hours. Coagulation Profile: No results for input(s): INR, PROTIME in the last 168 hours. Cardiac Enzymes: No results for input(s): CKTOTAL, CKMB, CKMBINDEX, TROPONINI in the last 168 hours. BNP (last 3 results) No results for input(s): PROBNP in the last 8760 hours. HbA1C: No results for input(s): HGBA1C in the last 72 hours. CBG: Recent Labs  Lab 01/20/21 1939 01/20/21 2315 01/21/21 0414 01/21/21 0740 01/21/21 1120  GLUCAP 156* 151* 138* 103* 167*   Lipid Profile: No results for input(s): CHOL, HDL, LDLCALC, TRIG, CHOLHDL, LDLDIRECT in the last 72 hours. Thyroid Function Tests: No results for input(s): TSH, T4TOTAL, FREET4, T3FREE, THYROIDAB in the last 72 hours. Anemia Panel: No results for input(s): VITAMINB12, FOLATE, FERRITIN, TIBC, IRON, RETICCTPCT in the last 72 hours. Sepsis Labs: No results for input(s): PROCALCITON, LATICACIDVEN in the last 168 hours.  No results found for this or any previous visit (from the past 240 hour(s)).   Radiology Studies: No results found.  Scheduled Meds: . aspirin  81 mg Per Tube Daily  . atorvastatin  80 mg Per Tube Daily  . carvedilol  12.5 mg Per Tube BID WC  . chlorhexidine gluconate (MEDLINE KIT)  15 mL Mouth Rinse BID  . Chlorhexidine Gluconate Cloth  6 each Topical Q0600  . docusate  100 mg Per Tube BID  . enoxaparin (LOVENOX) injection  0.5 mg/kg Subcutaneous Q24H  . famotidine  20 mg Per Tube Daily  . feeding supplement (PROSource TF)  45 mL Per Tube BID  . free water  250 mL Per Tube Q4H  . hydrALAZINE  25 mg Per Tube  Q6H  . insulin aspart  0-15 Units Subcutaneous Q4H  . ipratropium-albuterol  3 mL Nebulization TID  . mouth rinse  15 mL Mouth Rinse 10 times per day  . polyethylene glycol  17 g Per Tube Daily   Continuous Infusions: . feeding supplement (NEPRO CARB STEADY) 60 mL/hr at 01/20/21 1800     LOS: 27 days   Time spent: 45 minutes. More than 50% of the time was spent in counseling/coordination of care  Lorella Nimrod, MD [facility]  If 7PM-7AM, please contact night-coverage [email or web address]  01/21/2021, 1:34 PM   This record has been created using Systems analyst. Errors have been sought and corrected,but may not always be located. Such creation errors do not reflect on the standard of care.

## 2021-01-21 NOTE — Progress Notes (Signed)
PHARMACY CONSULT NOTE  Pharmacy Consult for Electrolyte Monitoring and Replacement   Recent Labs: Potassium (mmol/L)  Date Value  01/21/2021 4.2   Magnesium (mg/dL)  Date Value  29/09/1113 2.1   Calcium (mg/dL)  Date Value  52/03/222 8.8 (L)   Albumin (g/dL)  Date Value  36/07/2448 2.4 (L)   Phosphorus (mg/dL)  Date Value  75/30/0511 4.1   Sodium (mmol/L)  Date Value  01/21/2021 142    Assessment:  60 year old male with obesity, diabetes mellitus, hypertension, and combined diastolic and systolic heart failure who presented to the emergency department yesterday in respiratory distress admitted following witnessed cardiac arrest. Pt is S/P CPR. MRI with infarcts. PEG tube placed 5/24, tracheostomy 5/25. Pharmacy consulted for electrolyte monitoring and replacement.  PROSource 45 ml bid Nepro 60 ml/hr Free water 250 mL q4h (1.5 L/day)  Goal of Therapy:  Electrolytes WNL  Plan:   No electrolyte replacement warranted at this time  F/u electrolytes as ordered by providers  Tressie Ellis 01/21/2021 7:43 AM

## 2021-01-22 DIAGNOSIS — R7401 Elevation of levels of liver transaminase levels: Secondary | ICD-10-CM

## 2021-01-22 LAB — BASIC METABOLIC PANEL
Anion gap: 7 (ref 5–15)
BUN: 44 mg/dL — ABNORMAL HIGH (ref 6–20)
CO2: 24 mmol/L (ref 22–32)
Calcium: 8.4 mg/dL — ABNORMAL LOW (ref 8.9–10.3)
Chloride: 110 mmol/L (ref 98–111)
Creatinine, Ser: 1.31 mg/dL — ABNORMAL HIGH (ref 0.61–1.24)
GFR, Estimated: >60 mL/min (ref >60)
Glucose, Bld: 178 mg/dL — ABNORMAL HIGH (ref 70–99)
Potassium: 4.1 mmol/L (ref 3.5–5.1)
Sodium: 141 mmol/L (ref 135–145)

## 2021-01-22 LAB — CBC
HCT: 32.1 % — ABNORMAL LOW (ref 39.0–52.0)
Hemoglobin: 9.9 g/dL — ABNORMAL LOW (ref 13.0–17.0)
MCH: 24.5 pg — ABNORMAL LOW (ref 26.0–34.0)
MCHC: 30.8 g/dL (ref 30.0–36.0)
MCV: 79.5 fL — ABNORMAL LOW (ref 80.0–100.0)
Platelets: 231 10*3/uL (ref 150–400)
RBC: 4.04 MIL/uL — ABNORMAL LOW (ref 4.22–5.81)
RDW: 15.5 % (ref 11.5–15.5)
WBC: 9.9 10*3/uL (ref 4.0–10.5)
nRBC: 0 % (ref 0.0–0.2)

## 2021-01-22 LAB — GLUCOSE, CAPILLARY
Glucose-Capillary: 137 mg/dL — ABNORMAL HIGH (ref 70–99)
Glucose-Capillary: 138 mg/dL — ABNORMAL HIGH (ref 70–99)
Glucose-Capillary: 139 mg/dL — ABNORMAL HIGH (ref 70–99)
Glucose-Capillary: 147 mg/dL — ABNORMAL HIGH (ref 70–99)
Glucose-Capillary: 164 mg/dL — ABNORMAL HIGH (ref 70–99)
Glucose-Capillary: 170 mg/dL — ABNORMAL HIGH (ref 70–99)

## 2021-01-22 MED ORDER — POTASSIUM CHLORIDE CRYS ER 20 MEQ PO TBCR: 40.0000 meq | EXTENDED_RELEASE_TABLET | Freq: Once | ORAL | Status: DC

## 2021-01-22 NOTE — Progress Notes (Signed)
PHARMACY CONSULT NOTE  Pharmacy Consult for Electrolyte Monitoring and Replacement   Recent Labs: Potassium (mmol/L)  Date Value  01/22/2021 4.1   Magnesium (mg/dL)  Date Value  97/35/3299 2.1   Calcium (mg/dL)  Date Value  24/26/8341 8.4 (L)   Albumin (g/dL)  Date Value  96/22/2979 2.4 (L)   Phosphorus (mg/dL)  Date Value  89/21/1941 4.1   Sodium (mmol/L)  Date Value  01/22/2021 141    Assessment:  60 year old male with obesity, diabetes mellitus, hypertension, and combined diastolic and systolic heart failure who presented to the emergency department yesterday in respiratory distress admitted following witnessed cardiac arrest. Pt is S/P CPR. MRI with infarcts. PEG tube placed 5/24, tracheostomy 5/25. Pharmacy consulted for electrolyte monitoring and replacement.  PROSource 45 ml bid Nepro 60 ml/hr Free water 250 mL q4h (1.5 L/day)  Goal of Therapy:  Electrolytes WNL  Plan:   No electrolyte replacement warranted at this time  F/u electrolytes as ordered by providers  Albina Billet, PharmD, BCPS Clinical Pharmacist 01/22/2021 9:18 AM

## 2021-01-22 NOTE — Progress Notes (Signed)
PROGRESS NOTE  ICU transfer  Jeremy Browning  HFW:[id number] DOB: July 29, 1961 DOA: 12/25/2020 PCP: No primary care provider on file.   Brief Narrative: Taken from prior notes.  Prolonged hospitalization. Jeremy Browning is a 60 year old male with obesity, diabetes mellitus, hypertension, and combined diastolic and systolic heart failure who presented to the emergency department yesterday in respiratory distress.  In the emergency department he underwent emergent endotracheal intubation.  In the ER he also had an episode of ventricular tachycardia necessitating epinephrine x2 and cardioversion.  He was evaluated by the cardiologist and secondary to concern for acute coronary syndrome he was taken to the Cath Lab.  He was found to have patent coronary arteries with a cardiac index of approximately 2.8 and cardiac output of nearly 6.  Pulmonary capillary wedge pressure was approximately 30.  He presented hypertensive when in respiratory distress, but after intubation and sedation developed hypotension ultimately necessitating norepinephrine drip to help maintain adequate mean arterial pressure.    Patient has long complicated course on ventilator, concern of anoxic brain injury with cardiac arrest, trach was placed on 01/12/2021 and a PEG tube was placed on 01/11/2021.  Multiple family meetings but family remained very optimistic??  For recovery and wants everything needs to be done. Now able to weaned off from ventilator but continues to require 5 L of oxygen at 28% through trach. Remained pretty much unresponsive, just open eyes and not following any commands. PT is recommending LTAC-TOC is working on it. TRH to resume care on 01/21/2021  Significant events.  Copied from prior notes /7 severe resp failure 5/8remains on Vent 5/9remains on vent , multiorgan failureMRI shows brain injury 5/10remains on vent, multiorgan failure, brother updated 5/11severe resp failure 5/15filed weaning trials due to  severe brain damage 5/175fled weaning trials severe hypoxia dn vent dyssynchrony, sister updated 5/14vent support, severe brain damage, prognosis is poor 5/16unresponsive on wake up assessment, becomes tachypneic when sedation is lightened 5/17no purposeful movement on wake up assessment, becomes tachypneic, asynchronous with vent 5/18remains critically ill, severe brain damage, patient made DNR 5/20103fly decided on TRACH/PEG tube 5/23severe brain damage, no further imaging needed 5/24severe brain damage, PEG tube placed 5/[facility] s/p trach PEG tube 01/14/21- no changes today, awaitingj permanent placement  01/16/21- patient is stable without changes today. Plan for LTAC when able 01/17/21-met with family at bedside AngLevada Dyd SheFreda Munroisters of patient) they are hopeful for a recovery and appreciative of care. Questions answered medical plan reviewed.  01/18/21- patient stable on trache #8 - 28% FiO2, respond to pain with eye opening, awaiting LTACH assignment.  PiCC+. Reviewed plan with family including brother, SIL and two sisters.  01/20/21: No acute events, continues to tolerate Trach collar @ 28% FiO2, PT to work with pt, awaiting LTAC placement  Subjective: Patient was seen and examined today.  No significant change.  Remained pretty much unresponsive, only open eyes periodically.  Assessment & Plan:   Active Problems:   Cardiogenic shock (HCC)   Cardiac arrest with successful resuscitation (HCC[place] Flash pulmonary edema ([place]CC[place] Cardiopulmonary arrest with successful resuscitation ([place]CC[place] Acute respiratory failure with hypoxia and hypercapnia (HCC)   Acute renal failure (HCC)   Transaminitis   Second degree heart block   Lactic acidosis   Aspiration pneumonia (HCC)   HFrEF (heart failure with reduced ejection fraction) (HCC)   Severe hypoxic-ischemic encephalopathy   SOB (shortness of breath)   Oropharyngeal dysphagia  Acute Hypoxic Respiratory  Failure  in the setting of Cardiac arrest, Pulmonary edema and Proteus Pneumonia -S/p Tracheostomy placement on 01/12/21 -Supplemental O2 as needed to maintain O2 sats >92% -Currently tolerating trach collar (has not required Vent since 01/16/21) Completed a course of antibiotics with ceftriaxone and Unasyn. Continue to have significant trach secretions. -Continue with aggressive pulmonary toilet -Continue with as needed bronchodilators  Cardiac Arrest  (V-tach)/NSTEMI Acute combined Systolic & Diastolic CHF (noniscehmic)  Clinically stable.  Echocardiogram with EF of 45 to 50%. -Continue with aspirin, statin, beta-blocker.  Essential hypertension. -Continue with beta-blocker and hydralazine  Anoxic encephalopathy. Poor prognosis due to anoxic brain injury but family would like to continue current management. Very poor quality of life. According to 1 neurology note- Given EEG which is not flatline or very low voltage, the patient's long-term neurological prognosis is likely to be better than originally anticipated.  - The cortical restricted diffusion seen on prior MRI may therefore also be reversible. This can be reassessed with a follow up MRI brain. -PT is recommending LTAC-waiting for placement -We will ask neurology when to repeat MRI??  AKI.  Resolved  Nutrition.  S/p PEG tube -Continue with tube feed   Objective: Vitals:   01/22/21 1100 01/22/21 1200 01/22/21 1206 01/22/21 1300  BP: (!) 136/47 (!) 121/44  (!) 139/55  Pulse: 69 75  77  Resp: (!) 22 (!) 23  (!) 23  Temp:      TempSrc:      SpO2: 99% 99% 99% 100%  Weight:      Height:        Intake/Output Summary (Last 24 hours) at 01/22/2021 1523 Last data filed at 01/22/2021 1240 Gross per 24 hour  Intake 1690 ml  Output 2100 ml  Net -410 ml   Filed Weights   01/20/21 0402 01/21/21 0500 01/22/21 0500  Weight: 118.1 kg 118 kg 115.2 kg    Examination:  General.  Chronically ill-appearing, unresponsive gentleman,  in no acute distress, trach in place Pulmonary.  Lungs clear bilaterally, normal respiratory effort. CV.  Regular rate and rhythm, no JVD, rub or murmur. Abdomen.  Soft, nontender, nondistended, BS positive.  PEG tube in place CNS.  Open eyes spontaneously, not following any commands. Extremities.  No edema, no cyanosis, pulses intact and symmetrical. Psychiatry.  Judgment and insight appears impaired.  DVT prophylaxis: Lovenox Code Status: DNR Family Communication:  Disposition Plan:  Status is: Inpatient  Remains inpatient appropriate because:Inpatient level of care appropriate due to severity of illness   Dispo: The patient is from: Home              Anticipated d/c is to: LTAC              Patient currently is medically stable to d/c.   Difficult to place patient Yes              Level of care: Stepdown  All the records are reviewed and case discussed with Care Management/Social Worker. Management plans discussed with the patient, nursing and they are in agreement.  Consultants:   PCCM  Cardiology  Neurology  Procedures:  Antimicrobials:   Data Reviewed: I have personally reviewed following labs and imaging studies  CBC: Recent Labs  Lab 01/16/21 0458 01/17/21 0440 01/18/21 0512 01/22/21 0505  WBC 7.1 7.3 7.1 9.9  HGB 9.9* 9.6* 8.9* 9.9*  HCT 32.6* 31.9* 30.0* 32.1*  MCV 81.7 82.9 82.4 79.5*  PLT 235 223 206 378   Basic Metabolic Panel: Recent Labs  Lab 01/17/21 0440 01/18/21 0512 01/19/21 0502 01/20/21 0404 01/21/21 0537 01/22/21 0505  NA 146* 145 143 145 142 141  K 4.1 4.0 3.7 4.2 4.2 4.1  CL 114* 114* 113* 113* 110 110  CO2 22 21* '23 24 24 24  ' GLUCOSE 159* 157* 165* 147* 143* 178*  BUN 58* 47* 44* 42* 40* 44*  CREATININE 1.42* 1.34* 1.40* 1.39* 1.21 1.31*  CALCIUM 8.9 8.6* 8.9 8.9 8.8* 8.4*  MG 2.1 1.9 2.1 2.3 2.1  --   PHOS 4.0 3.5 4.0 4.8* 4.1  --    GFR: Estimated Creatinine Clearance: 79.5 mL/min (A) (by C-G formula based on SCr of  1.31 mg/dL (H)). Liver Function Tests: No results for input(s): AST, ALT, ALKPHOS, BILITOT, PROT, ALBUMIN in the last 168 hours. No results for input(s): LIPASE, AMYLASE in the last 168 hours. No results for input(s): AMMONIA in the last 168 hours. Coagulation Profile: No results for input(s): INR, PROTIME in the last 168 hours. Cardiac Enzymes: No results for input(s): CKTOTAL, CKMB, CKMBINDEX, TROPONINI in the last 168 hours. BNP (last 3 results) No results for input(s): PROBNP in the last 8760 hours. HbA1C: No results for input(s): HGBA1C in the last 72 hours. CBG: Recent Labs  Lab 01/21/21 1934 01/21/21 2337 01/22/21 0457 01/22/21 0752 01/22/21 1136  GLUCAP 119* 144* 147* 170* 139*   Lipid Profile: No results for input(s): CHOL, HDL, LDLCALC, TRIG, CHOLHDL, LDLDIRECT in the last 72 hours. Thyroid Function Tests: No results for input(s): TSH, T4TOTAL, FREET4, T3FREE, THYROIDAB in the last 72 hours. Anemia Panel: No results for input(s): VITAMINB12, FOLATE, FERRITIN, TIBC, IRON, RETICCTPCT in the last 72 hours. Sepsis Labs: No results for input(s): PROCALCITON, LATICACIDVEN in the last 168 hours.  No results found for this or any previous visit (from the past 240 hour(s)).   Radiology Studies: No results found.  Scheduled Meds: . aspirin  81 mg Per Tube Daily  . atorvastatin  80 mg Per Tube Daily  . carvedilol  12.5 mg Per Tube BID WC  . chlorhexidine gluconate (MEDLINE KIT)  15 mL Mouth Rinse BID  . Chlorhexidine Gluconate Cloth  6 each Topical Q0600  . docusate  100 mg Per Tube BID  . enoxaparin (LOVENOX) injection  0.5 mg/kg Subcutaneous Q24H  . famotidine  20 mg Per Tube Daily  . feeding supplement (PROSource TF)  45 mL Per Tube BID  . free water  250 mL Per Tube Q4H  . hydrALAZINE  25 mg Per Tube Q6H  . insulin aspart  0-15 Units Subcutaneous Q4H  . ipratropium-albuterol  3 mL Nebulization TID  . mouth rinse  15 mL Mouth Rinse 10 times per day  .  polyethylene glycol  17 g Per Tube Daily   Continuous Infusions: . feeding supplement (NEPRO CARB STEADY) 1,000 mL (01/22/21 1049)     LOS: 28 days   Time spent: 35 minutes. More than 50% of the time was spent in counseling/coordination of care  Lorella Nimrod, MD [facility]  If 7PM-7AM, please contact night-coverage [email or web address]  01/22/2021, 3:23 PM   This record has been created using Systems analyst. Errors have been sought and corrected,but may not always be located. Such creation errors do not reflect on the standard of care.

## 2021-01-22 NOTE — Progress Notes (Signed)
Tracheal suctioned for mod amt of thick tan secretions

## 2021-01-23 LAB — BASIC METABOLIC PANEL
Anion gap: 6 (ref 5–15)
BUN: 43 mg/dL — ABNORMAL HIGH (ref 6–20)
CO2: 23 mmol/L (ref 22–32)
Calcium: 8.2 mg/dL — ABNORMAL LOW (ref 8.9–10.3)
Chloride: 109 mmol/L (ref 98–111)
Creatinine, Ser: 1.24 mg/dL (ref 0.61–1.24)
GFR, Estimated: >60 mL/min (ref >60)
Glucose, Bld: 140 mg/dL — ABNORMAL HIGH (ref 70–99)
Potassium: 3.9 mmol/L (ref 3.5–5.1)
Sodium: 138 mmol/L (ref 135–145)

## 2021-01-23 LAB — GLUCOSE, CAPILLARY
Glucose-Capillary: 134 mg/dL — ABNORMAL HIGH (ref 70–99)
Glucose-Capillary: 141 mg/dL — ABNORMAL HIGH (ref 70–99)
Glucose-Capillary: 150 mg/dL — ABNORMAL HIGH (ref 70–99)
Glucose-Capillary: 137 mg/dL — ABNORMAL HIGH (ref 70–99)
Glucose-Capillary: 115 mg/dL — ABNORMAL HIGH (ref 70–99)

## 2021-01-23 MED ORDER — FREE WATER
250.0000 mL | Freq: Four times a day (QID) | Status: DC
  Administered 2021-01-23 – 2021-01-26 (×12): 250 mL

## 2021-01-23 NOTE — Progress Notes (Signed)
PHARMACY CONSULT NOTE  Pharmacy Consult for Electrolyte Monitoring and Replacement   Recent Labs: Potassium (mmol/L)  Date Value  01/23/2021 3.9   Magnesium (mg/dL)  Date Value  90/24/0973 2.1   Calcium (mg/dL)  Date Value  53/29/9242 8.2 (L)   Albumin (g/dL)  Date Value  68/34/1962 2.4 (L)   Phosphorus (mg/dL)  Date Value  22/97/9892 4.1   Sodium (mmol/L)  Date Value  01/23/2021 138    Assessment:  60 year old male with obesity, diabetes mellitus, hypertension, and combined diastolic and systolic heart failure who presented to the emergency department yesterday in respiratory distress admitted following witnessed cardiac arrest. Pt is S/P CPR. MRI with infarcts. PEG tube placed 5/24, tracheostomy 5/25. Pharmacy consulted for electrolyte monitoring and replacement.  PROSource 45 ml bid Nepro 60 ml/hr Free water 250 mL q4h (1.5 L/day)  Goal of Therapy:  Electrolytes WNL  Plan:   No electrolyte replacement warranted at this time  F/u electrolytes as ordered by providers  Albina Billet, PharmD, BCPS Clinical Pharmacist 01/23/2021 9:47 AM

## 2021-01-23 NOTE — Progress Notes (Signed)
PROGRESS NOTE  ICU transfer  Jeremy Browning  RSW:[id number] DOB: 04-28-1961 DOA: 12/25/2020 PCP: No primary care provider on file.   Brief Narrative: Taken from prior notes.  Prolonged hospitalization. Jeremy Browning is a 60 year old male with obesity, diabetes mellitus, hypertension, and combined diastolic and systolic heart failure who presented to the emergency department yesterday in respiratory distress.  In the emergency department he underwent emergent endotracheal intubation.  In the ER he also had an episode of ventricular tachycardia necessitating epinephrine x2 and cardioversion.  He was evaluated by the cardiologist and secondary to concern for acute coronary syndrome he was taken to the Cath Lab.  He was found to have patent coronary arteries with a cardiac index of approximately 2.8 and cardiac output of nearly 6.  Pulmonary capillary wedge pressure was approximately 30.  He presented hypertensive when in respiratory distress, but after intubation and sedation developed hypotension ultimately necessitating norepinephrine drip to help maintain adequate mean arterial pressure.    Patient has long complicated course on ventilator, concern of anoxic brain injury with cardiac arrest, trach was placed on 01/12/2021 and a PEG tube was placed on 01/11/2021.  Multiple family meetings but family remained very optimistic??  For recovery and wants everything needs to be done. Now able to weaned off from ventilator but continues to require 5 L of oxygen at 28% through trach. Remained pretty much unresponsive, just open eyes and not following any commands. PT is recommending LTAC-TOC is working on it. TRH to resume care on 01/21/2021  Significant events.  Copied from prior notes /7 severe resp failure 5/8remains on Vent 5/9remains on vent , multiorgan failureMRI shows brain injury 5/10remains on vent, multiorgan failure, brother updated 5/11severe resp failure 5/6failed weaning trials due to  severe brain damage 5/34failed weaning trials severe hypoxia dn vent dyssynchrony, sister updated 5/14vent support, severe brain damage, prognosis is poor 5/16unresponsive on wake up assessment, becomes tachypneic when sedation is lightened 5/17no purposeful movement on wake up assessment, becomes tachypneic, asynchronous with vent 5/18remains critically ill, severe brain damage, patient made DNR 5/59family decided on TRACH/PEG tube 5/23severe brain damage, no further imaging needed 5/24severe brain damage, PEG tube placed 5/25 [name] PLACED by ENT 5/26 s/p trach PEG tube 01/14/21- no changes today, awaitingj permanent placement  01/16/21- patient is stable without changes today. Plan for LTAC when able 01/17/21-met with family at bedside Jeremy Browning and Jeremy Browning (sisters of patient) they are hopeful for a recovery and appreciative of care. Questions answered medical plan reviewed.  01/18/21- patient stable on trache #8 - 28% FiO2, respond to pain with eye opening, awaiting LTACH assignment.  PiCC+. Reviewed plan with family including brother, SIL and two sisters.  01/20/21: No acute events, continues to tolerate Trach collar @ 28% FiO2, PT to work with pt, awaiting LTAC placement  Subjective: Patient was seen and examined today.  2 sisters were at bedside.  Pretty much remained the same, just open eyes when called his name, no other response.  Assessment & Plan:   Active Problems:   Cardiogenic shock (HCC)   Cardiac arrest with successful resuscitation ([place])   Flash pulmonary edema ([place])   Cardiopulmonary arrest with successful resuscitation ([place])   Acute respiratory failure with hypoxia and hypercapnia (HCC)   Acute renal failure (HCC)   Transaminitis   Second degree heart block   Lactic acidosis   Aspiration pneumonia (HCC)   HFrEF (heart failure with reduced ejection fraction) (HCC)   Severe hypoxic-ischemic encephalopathy   SOB (shortness of breath)  Oropharyngeal  dysphagia  Acute Hypoxic Respiratory Failure in the setting of Cardiac arrest, Pulmonary edema and Proteus Pneumonia -S/p Tracheostomy placement on 01/12/21 -Supplemental O2 as needed to maintain O2 sats >92% -Currently tolerating trach collar (has not required Vent since 01/16/21) Completed a course of antibiotics with ceftriaxone and Unasyn. Continue to have significant trach secretions. -Continue with aggressive pulmonary toilet -Continue with as needed bronchodilators  Cardiac Arrest  (V-tach)/NSTEMI Acute combined Systolic & Diastolic CHF (noniscehmic)  Clinically stable.  Echocardiogram with EF of 45 to 50%. -Continue with aspirin, statin, beta-blocker.  Essential hypertension. -Continue with beta-blocker and hydralazine  Anoxic encephalopathy. Poor prognosis due to anoxic brain injury but family would like to continue current management. Very poor quality of life. According to 1 neurology note- Given EEG which is not flatline or very low voltage, the patient's long-term neurological prognosis is likely to be better than originally anticipated.  - The cortical restricted diffusion seen on prior MRI may therefore also be reversible. This can be reassessed with a follow up MRI brain. -PT is recommending LTAC-waiting for placement -We will ask neurology when to repeat MRI??  AKI.  Resolved  Nutrition.  S/p PEG tube -Continue with tube feed -We will decrease the frequency of free water from every 4 hourly to 6 hourly as sodium is trending down.   Objective: Vitals:   01/23/21 0400 01/23/21 0500 01/23/21 0532 01/23/21 0600  BP: (!) 147/44 (!) 161/62 (!) 161/62 (!) 164/65  Pulse: 71 72  78  Resp: 19 (!) 21  (!) 24  Temp: 99.8 F (37.7 C)     TempSrc: Axillary     SpO2: 100% 100%  100%  Weight:  115 kg    Height:        Intake/Output Summary (Last 24 hours) at 01/23/2021 0754 Last data filed at 01/23/2021 0600 Gross per 24 hour  Intake --  Output 1575 ml  Net -1575 ml    Filed Weights   01/21/21 0500 01/22/21 0500 01/23/21 0500  Weight: 118 kg 115.2 kg 115 kg    Examination:  General.  Developed gentleman, just open eyes, not following any command, in no acute distress. Pulmonary.  A lot of upper respiratory transmitted sounds, normal respiratory effort. CV.  Regular rate and rhythm, no JVD, rub or murmur. Abdomen.  Soft, nontender, nondistended, BS positive. CNS.  Just open eyes on command, no other response, no spontaneous limb movement seen. Extremities.  No edema, no cyanosis, pulses intact and symmetrical. Psychiatry.  Judgment and insight appears impaired.  DVT prophylaxis: Lovenox Code Status: DNR Family Communication: 2 sisters were updated at bedside Disposition Plan:  Status is: Inpatient  Remains inpatient appropriate because:Inpatient level of care appropriate due to severity of illness   Dispo: The patient is from: Home              Anticipated d/c is to: LTAC              Patient currently is medically stable to d/c.   Difficult to place patient Yes              Level of care: Stepdown  All the records are reviewed and case discussed with Care Management/Social Worker. Management plans discussed with the patient, nursing and they are in agreement.  Consultants:   PCCM  Cardiology  Neurology  Procedures:  Antimicrobials:   Data Reviewed: I have personally reviewed following labs and imaging studies  CBC: Recent Labs  Lab 01/17/21 0440 01/18/21  4158 01/22/21 0505  WBC 7.3 7.1 9.9  HGB 9.6* 8.9* 9.9*  HCT 31.9* 30.0* 32.1*  MCV 82.9 82.4 79.5*  PLT 223 206 309   Basic Metabolic Panel: Recent Labs  Lab 01/17/21 0440 01/18/21 0512 01/19/21 0502 01/20/21 0404 01/21/21 0537 01/22/21 0505 01/23/21 0439  NA 146* 145 143 145 142 141 138  K 4.1 4.0 3.7 4.2 4.2 4.1 3.9  CL 114* 114* 113* 113* 110 110 109  CO2 22 21* $Remo'23 24 24 24 23  'pYNaV$ GLUCOSE 159* 157* 165* 147* 143* 178* 140*  BUN 58* 47* 44* 42* 40* 44*  43*  CREATININE 1.42* 1.34* 1.40* 1.39* 1.21 1.31* 1.24  CALCIUM 8.9 8.6* 8.9 8.9 8.8* 8.4* 8.2*  MG 2.1 1.9 2.1 2.3 2.1  --   --   PHOS 4.0 3.5 4.0 4.8* 4.1  --   --    GFR: Estimated Creatinine Clearance: 83.9 mL/min (by C-G formula based on SCr of 1.24 mg/dL). Liver Function Tests: No results for input(s): AST, ALT, ALKPHOS, BILITOT, PROT, ALBUMIN in the last 168 hours. No results for input(s): LIPASE, AMYLASE in the last 168 hours. No results for input(s): AMMONIA in the last 168 hours. Coagulation Profile: No results for input(s): INR, PROTIME in the last 168 hours. Cardiac Enzymes: No results for input(s): CKTOTAL, CKMB, CKMBINDEX, TROPONINI in the last 168 hours. BNP (last 3 results) No results for input(s): PROBNP in the last 8760 hours. HbA1C: No results for input(s): HGBA1C in the last 72 hours. CBG: Recent Labs  Lab 01/22/21 1531 01/22/21 1905 01/22/21 2352 01/23/21 0346 01/23/21 0721  GLUCAP 137* 164* 138* 134* 141*   Lipid Profile: No results for input(s): CHOL, HDL, LDLCALC, TRIG, CHOLHDL, LDLDIRECT in the last 72 hours. Thyroid Function Tests: No results for input(s): TSH, T4TOTAL, FREET4, T3FREE, THYROIDAB in the last 72 hours. Anemia Panel: No results for input(s): VITAMINB12, FOLATE, FERRITIN, TIBC, IRON, RETICCTPCT in the last 72 hours. Sepsis Labs: No results for input(s): PROCALCITON, LATICACIDVEN in the last 168 hours.  No results found for this or any previous visit (from the past 240 hour(s)).   Radiology Studies: No results found.  Scheduled Meds: . aspirin  81 mg Per Tube Daily  . atorvastatin  80 mg Per Tube Daily  . carvedilol  12.5 mg Per Tube BID WC  . chlorhexidine gluconate (MEDLINE KIT)  15 mL Mouth Rinse BID  . Chlorhexidine Gluconate Cloth  6 each Topical Q0600  . docusate  100 mg Per Tube BID  . enoxaparin (LOVENOX) injection  0.5 mg/kg Subcutaneous Q24H  . famotidine  20 mg Per Tube Daily  . feeding supplement (PROSource TF)   45 mL Per Tube BID  . free water  250 mL Per Tube Q4H  . hydrALAZINE  25 mg Per Tube Q6H  . insulin aspart  0-15 Units Subcutaneous Q4H  . ipratropium-albuterol  3 mL Nebulization TID  . mouth rinse  15 mL Mouth Rinse 10 times per day  . polyethylene glycol  17 g Per Tube Daily   Continuous Infusions: . feeding supplement (NEPRO CARB STEADY) 1,000 mL (01/22/21 1049)     LOS: 29 days   Time spent: 30 minutes. More than 50% of the time was spent in counseling/coordination of care  Lorella Nimrod, MD [facility]  If 7PM-7AM, please contact night-coverage [email or web address]  01/23/2021, 7:54 AM   This record has been created using Systems analyst. Errors have been sought and corrected,but may not always be located.  Such creation errors do not reflect on the standard of care.

## 2021-01-24 LAB — CBC
HCT: 30.9 % — ABNORMAL LOW (ref 39.0–52.0)
Hemoglobin: 9.5 g/dL — ABNORMAL LOW (ref 13.0–17.0)
MCH: 24.1 pg — ABNORMAL LOW (ref 26.0–34.0)
MCHC: 30.7 g/dL (ref 30.0–36.0)
MCV: 78.2 fL — ABNORMAL LOW (ref 80.0–100.0)
Platelets: 237 10*3/uL (ref 150–400)
RBC: 3.95 MIL/uL — ABNORMAL LOW (ref 4.22–5.81)
RDW: 15.3 % (ref 11.5–15.5)
WBC: 10.1 10*3/uL (ref 4.0–10.5)
nRBC: 0 % (ref 0.0–0.2)

## 2021-01-24 LAB — GLUCOSE, CAPILLARY
Glucose-Capillary: 130 mg/dL — ABNORMAL HIGH (ref 70–99)
Glucose-Capillary: 135 mg/dL — ABNORMAL HIGH (ref 70–99)
Glucose-Capillary: 141 mg/dL — ABNORMAL HIGH (ref 70–99)
Glucose-Capillary: 146 mg/dL — ABNORMAL HIGH (ref 70–99)
Glucose-Capillary: 149 mg/dL — ABNORMAL HIGH (ref 70–99)
Glucose-Capillary: 155 mg/dL — ABNORMAL HIGH (ref 70–99)
Glucose-Capillary: 182 mg/dL — ABNORMAL HIGH (ref 70–99)

## 2021-01-24 NOTE — Progress Notes (Addendum)
PHARMACY CONSULT NOTE  Pharmacy Consult for Electrolyte Monitoring and Replacement   Recent Labs: Potassium (mmol/L)  Date Value  01/23/2021 3.9   Magnesium (mg/dL)  Date Value  38/45/3646 2.1   Calcium (mg/dL)  Date Value  80/32/1224 8.2 (L)   Albumin (g/dL)  Date Value  82/50/0370 2.4 (L)   Phosphorus (mg/dL)  Date Value  48/88/9169 4.1   Sodium (mmol/L)  Date Value  01/23/2021 138    Assessment:  60 year old male with obesity, diabetes mellitus, hypertension, and combined diastolic and systolic heart failure who presented to the emergency department yesterday in respiratory distress admitted following witnessed cardiac arrest. Pt is S/P CPR. MRI with infarcts. PEG tube placed 5/24, tracheostomy 5/25. Pharmacy consulted for electrolyte monitoring and replacement.  PROSource 45 ml bid Nepro 60 ml/hr Free water 250 mL q4h (1.5 L/day)  Goal of Therapy:  Electrolytes WNL  Plan:   No electrolyte replacement warranted at this time  Will discontinue consult at this time given stable labs. Defer further ordering of labs and electrolyte replacement to hospitalist  Will continue to monitor peripherally  Tressie Ellis 01/24/2021 7:55 AM

## 2021-01-24 NOTE — Progress Notes (Addendum)
Physical Therapy Treatment Patient Details Name: Jeremy Browning MRN: [id number] DOB: 10/22/1960 Today's Date: 01/24/2021    History of Present Illness Pt is a 60 yo male that presented from home with respiratory distress 12/25/2020. Underwent defibrillation and CPR, emergently intubated. During admission pt has had a cardiac cath, peg tube and tracheostomy placed, weaned from vent 5/26 AM. 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 Mildly restricted diffusion involving the cortex more diffusely over the right cerebral convexity favored to reflect an acute MCA territory infarct over global hypoxic injury given unilaterality".    PT Comments    Patient has progressed to RLAS III today.  Patient with localized responses to stimuli given including auditory, visual, and motor stimulation. Attempts were made at communicating by talking today. Could hear patient's voice despite trach in place. Patient also nodding yes appropriately to questions intermittently and patient able to follow single step commands intermittently. Patient sat up on edge of bed for ~ 20 minutes with assistance and demonstrated protective righting reactions. He continues to require total assistance for bed mobility and overall sitting balance. Right gaze preference but able to track to the left. Recommend to continue PT to maximize independence and decrease caregiver burden.    Follow Up Recommendations  LTACH     Equipment Recommendations  None recommended by PT    Recommendations for Other Services Speech consult     Precautions / Restrictions Precautions Precautions: Fall Precaution Comments: trach, PEG Restrictions Weight Bearing Restrictions: No    Mobility  Bed Mobility Overal bed mobility: Needs Assistance Bed Mobility: Supine to Sit;Sit to Supine;Rolling Rolling: Total assist   Supine to sit: Total assist;+2 for physical assistance Sit to supine: Total  assist;+2 for physical assistance   General bed mobility comments: no active initiation with bed mobility attempts. patient required total assistance +2 person with faciliation for task initiation. patient grimices at times with bed mobility. total assistance also for rolling in bed with total care for peri-care as patient with bowel movement after sitting upright    Transfers                 General transfer comment: not attempted due to poot sitting balance and severity of generalized weakness  Ambulation/Gait                 Stairs             Wheelchair Mobility    Modified Rankin (Stroke Patients Only)       Balance Overall balance assessment: Needs assistance Sitting-balance support: Feet unsupported;Bilateral upper extremity supported Sitting balance-Leahy Scale: Zero Sitting balance - Comments: patient dangled at edge of bed for approximately 20 minutes with faciliation and verbal cues provided for midline posture. patient has tendency to lean to left and posteriorly, however has loss of balance in all directions. patient did demonstrate protective righting reactions in stiting for the first time today. no change in vitals with upright activity, Sp02 100%                                    Cognition Arousal/Alertness: Awake/alert Behavior During Therapy: Flat affect Overall Cognitive Status: Impaired/Different from baseline Area of Impairment: Rancho level               Rancho Levels of Cognitive Functioning Ranch[place]igos Scales of Cognitive Functioning: Localized response  General Comments: patient able to follow single step command with activating right foot movement in sitting position. patient intermittently blinking or nodding to commands and appropriately when asked questions      Exercises General Exercises - Upper Extremity Shoulder Flexion: PROM;Both;Supine;5 reps Shoulder Extension:  PROM;Both;Supine;5 reps Shoulder ABduction: PROM;Both;Supine;5 reps Shoulder ADduction: PROM;Both;Supine;5 reps Shoulder Horizontal ABduction: PROM;Both;Supine;5 reps Shoulder Horizontal ADduction: PROM;Both;Supine;5 reps Elbow Flexion: PROM;Both;Supine;5 reps Elbow Extension: PROM;Both;Supine;5 reps Wrist Flexion: PROM;Both;Supine;5 reps Wrist Extension: PROM;Both;Supine;5 reps Digit Composite Flexion: PROM;Both;Supine;5 reps Other Exercises Other Exercises: PROM of BUE. Don/doff B prafo boots and don B resting hand splints. sup<>sit, sitting balance/tolerance, toileting, rolling Other Exercises: Grooming, don B resting hand splints    General Comments General comments (skin integrity, edema, etc.): VSS      Pertinent Vitals/Pain Pain Assessment: Faces Faces Pain Scale: Hurts even more Pain Location: grimicing with PROM of extremities Pain Descriptors / Indicators: Grimacing Pain Intervention(s): Limited activity within patient's tolerance    Home Living                      Prior Function            PT Goals (current goals can now be found in the care plan section) Acute Rehab PT Goals Patient Stated Goal: to improve pain PT Goal Formulation: Patient unable to participate in goal setting Time For Goal Achievement: 01/27/21 Potential to Achieve Goals: Fair Progress towards PT goals: Progressing toward goals    Frequency    Min 2X/week      PT Plan Current plan remains appropriate    Co-evaluation   Reason for Co-Treatment: Complexity of the patient's impairments (multi-system involvement) PT goals addressed during session: Mobility/safety with mobility;Balance OT goals addressed during session: ADL's and self-care      AM-PAC PT "6 Clicks" Mobility   Outcome Measure  Help needed turning from your back to your side while in a flat bed without using bedrails?: Total Help needed moving from lying on your back to sitting on the side of a flat bed  without using bedrails?: Total Help needed moving to and from a bed to a chair (including a wheelchair)?: Total Help needed standing up from a chair using your arms (e.g., wheelchair or bedside chair)?: Total Help needed to walk in hospital room?: Total Help needed climbing 3-5 steps with a railing? : Total 6 Click Score: 6    End of Session Equipment Utilized During Treatment: Oxygen (trach collar) Activity Tolerance: Patient tolerated treatment well Patient left: in bed;with call bell/phone within reach (OT in the room) Nurse Communication: Mobility status PT Visit Diagnosis: Other abnormalities of gait and mobility (R26.89);Muscle weakness (generalized) (M62.81);Other symptoms and signs involving the nervous system (R29.898)     Time: 6333-5456 PT Time Calculation (min) (ACUTE ONLY): 35 min  Charges:  $Therapeutic Activity: 8-22 mins                     Donna Bernard, PT, MPT    [name] 01/24/2021, 3:27 PM

## 2021-01-24 NOTE — Progress Notes (Signed)
Chaplain Maggie made initial visit to meet patient at bedside. Family note on the white board reads, "God please". Chaplain joined [employer] and spoke a prayer asking for divine assistance related to all of the needs of the patient and family at this time. Chaplain will follow up.

## 2021-01-24 NOTE — Progress Notes (Signed)
Occupational Therapy Treatment Patient Details Name: Jeremy Browning MRN: [id number] DOB: 02-Apr-1961 Today's Date: 01/24/2021    History of present illness Pt is a 60 yo male that presented from home with respiratory distress 12/25/2020. Underwent defibrillation and CPR, emergently intubated. During admission pt has had a cardiac cath, peg tube and tracheostomy placed, weaned from vent 5/26 AM. 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 Mildly restricted diffusion involving the cortex more diffusely over the right cerebral convexity favored to reflect an acute MCA territory infarct over global hypoxic injury given unilaterality".   OT comments  Jeremy Browning was seen for OT treatment on this date. Upon arrival to room pt sleeping, wakes to touch and light. Pt is RLAS Level III. Pt demonstrates improved communication and response to commands following sitting EOB. Pt requires TOTAL A x2 for bed mobility and MAX A x2 for hand over hand oral care - requires +2 for sitting balance, pt assists with oralmotor movement. Pt Pt tolerated ~20 min sitting EOB, demonstrated protective righting reactions. Pt demonstrated greatly improved and consistent 1 step command following. Pt making good progress toward goals. Pt continues to benefit from skilled OT services to maximize return to PLOF and minimize risk of future falls, injury, caregiver burden, and readmission. Will continue to follow POC. Discharge recommendation remains appropriate.    Follow Up Recommendations  LTACH    Equipment Recommendations  Hospital bed    Recommendations for Other Services      Precautions / Restrictions Precautions Precautions: Fall Precaution Comments: trach, PEG Restrictions Weight Bearing Restrictions: No       Mobility Bed Mobility Overal bed mobility: Needs Assistance Bed Mobility: Supine to Sit;Sit to Supine     Supine to sit: Total assist;+2 for physical  assistance Sit to supine: Total assist;+2 for physical assistance                           Balance Overall balance assessment: Needs assistance Sitting-balance support: Feet unsupported;Bilateral upper extremity supported Sitting balance-Leahy Scale: Zero Sitting balance - Comments: Pt tolerated ~20 min sitting EOB, demonstrated protective righting reactions                                   ADL either performed or assessed with clinical judgement   ADL Overall ADL's : Needs assistance/impaired                                       General ADL Comments: MAX Ax2 hand over hand oral care, +2 for sitting balance, pt assists with oralmotor movement. TOTAL A don/doff B prafo boots and resting hand splints at bed level. TOTAL A x2 for toileting bed level.               Cognition Arousal/Alertness: Awake/alert Behavior During Therapy: Flat affect Overall Cognitive Status: Impaired/Different from baseline Area of Impairment: Rancho level               Rancho Levels of Cognitive Functioning Rancho Los Amigos Scales of Cognitive Functioning: Localized response               General Comments: demonstrated 1 step command following - kick foot 2 times/3 times. Blink once for yes twice for no. Nod yes and shake  no.        Exercises Exercises: Other exercises General Exercises - Upper Extremity Shoulder Flexion: PROM;Both;Supine;5 reps Shoulder Extension: PROM;Both;Supine;5 reps Shoulder ABduction: PROM;Both;Supine;5 reps Shoulder ADduction: PROM;Both;Supine;5 reps Shoulder Horizontal ABduction: PROM;Both;Supine;5 reps Shoulder Horizontal ADduction: PROM;Both;Supine;5 reps Elbow Flexion: PROM;Both;Supine;5 reps Elbow Extension: PROM;Both;Supine;5 reps Wrist Flexion: PROM;Both;Supine;5 reps Wrist Extension: PROM;Both;Supine;5 reps Digit Composite Flexion: PROM;Both;Supine;5 reps Other Exercises Other Exercises: PROM of BUE.  Don/doff B prafo boots and don B resting hand splints. sup<>sit, sitting balance/tolerance, toileting, rolling Other Exercises: Grooming, don B resting hand splints   Shoulder Instructions       General Comments VSS    Pertinent Vitals/ Pain       Pain Assessment: Faces Faces Pain Scale: Hurts even more Pain Location: grimicing with passive stretching at end ranges of all extremeties Pain Descriptors / Indicators: Grimacing Pain Intervention(s): Limited activity within patient's tolerance;Repositioned;Patient requesting pain meds-RN notified         Frequency  Min 1X/week        Progress Toward Goals  OT Goals(current goals can now be found in the care plan section)  Progress towards OT goals: Progressing toward goals  Acute Rehab OT Goals Patient Stated Goal: to improve pain OT Goal Formulation: With patient Time For Goal Achievement: 01/27/21 Potential to Achieve Goals: Fair ADL Goals Additional ADL Goal #1: Pt's care team will demo competency in b/l resting hand splint wear schedule and donning/doffing with no cues Additional ADL Goal #2: Pt's care team will demonstrate competency in positioning recommendations for b/l UE edema reduction with no verbal cues. Additional ADL Goal #3: Pt will experience at least 30% reduction of b/l UE edema through manual techniques as evidenced by no pitting in hands.  Plan Discharge plan remains appropriate;Frequency needs to be updated    Co-evaluation    PT/OT/SLP Co-Evaluation/Treatment: Yes Reason for Co-Treatment: Complexity of the patient's impairments (multi-system involvement) PT goals addressed during session: Mobility/safety with mobility OT goals addressed during session: ADL's and self-care      AM-PAC OT "6 Clicks" Daily Activity     Outcome Measure   Help from another person eating meals?: Total Help from another person taking care of personal grooming?: A Lot Help from another person toileting, which includes  using toliet, bedpan, or urinal?: Total Help from another person bathing (including washing, rinsing, drying)?: Total Help from another person to put on and taking off regular upper body clothing?: Total Help from another person to put on and taking off regular lower body clothing?: Total 6 Click Score: 7    End of Session    OT Visit Diagnosis: Other symptoms and signs involving the nervous system (R29.898);Other symptoms and signs involving cognitive function   Activity Tolerance Patient tolerated treatment well   Patient Left in bed;with bed alarm set   Nurse Communication Patient requests pain meds        Time: 1320-1408 OT Time Calculation (min): 48 min  Charges: OT General Charges $OT Visit: 1 Visit OT Treatments $Self Care/Home Management : 8-22 mins $Therapeutic Exercise: 8-22 mins   Kathie Dike, M.S. OTR/L  01/24/21, 2:49 PM  ascom [phone]

## 2021-01-24 NOTE — Progress Notes (Signed)
PROGRESS NOTE  ICU transfer  Jeremy Browning  EUM:[id number] DOB: 1961/03/13 DOA: 12/25/2020 PCP: No primary care provider on file.   Brief Narrative: Taken from prior notes.  Prolonged hospitalization. Jeremy Browning is a 60 year old male with obesity, diabetes mellitus, hypertension, and combined diastolic and systolic heart failure who presented to the emergency department yesterday in respiratory distress.  In the emergency department he underwent emergent endotracheal intubation.  In the ER he also had an episode of ventricular tachycardia necessitating epinephrine x2 and cardioversion.  He was evaluated by the cardiologist and secondary to concern for acute coronary syndrome he was taken to the Cath Lab.  He was found to have patent coronary arteries with a cardiac index of approximately 2.8 and cardiac output of nearly 6.  Pulmonary capillary wedge pressure was approximately 30.  He presented hypertensive when in respiratory distress, but after intubation and sedation developed hypotension ultimately necessitating norepinephrine drip to help maintain adequate mean arterial pressure.    Patient has long complicated course on ventilator, concern of anoxic brain injury with cardiac arrest, trach was placed on 01/12/2021 and a PEG tube was placed on 01/11/2021.  Multiple family meetings but family remained very optimistic??  For recovery and wants everything needs to be done. Now able to weaned off from ventilator but continues to require 5 L of oxygen at 28% through trach. Remained pretty much unresponsive, just open eyes and not following any commands. PT is recommending LTAC-TOC is working on it. TRH to resume care on 01/21/2021  Significant events.  Copied from prior notes /7 severe resp failure 5/8remains on Vent 5/9remains on vent , multiorgan failureMRI shows brain injury 5/10remains on vent, multiorgan failure, brother updated 5/11severe resp failure 5/75filed weaning trials due to  severe brain damage 5/163fled weaning trials severe hypoxia dn vent dyssynchrony, sister updated 5/14vent support, severe brain damage, prognosis is poor 5/16unresponsive on wake up assessment, becomes tachypneic when sedation is lightened 5/17no purposeful movement on wake up assessment, becomes tachypneic, asynchronous with vent 5/18remains critically ill, severe brain damage, patient made DNR 5/203fly decided on TRACH/PEG tube 5/23severe brain damage, no further imaging needed 5/24severe brain damage, [facility] s/p trach PEG tube 01/14/21- no changes today, awaitingj permanent placement  01/16/21- patient is stable without changes today. Plan for LTAC when able 01/17/21-met with family at bedside AngLevada Dyd SheFreda Munroisters of patient) they are hopeful for a recovery and appreciative of care. Questions answered medical plan reviewed.  01/18/21- patient stable on trache #8 - 28% FiO2, respond to pain with eye opening, awaiting LTACH assignment.  PiCC+. Reviewed plan with family including brother, SIL and two sisters.  01/20/21: No acute events, continues to tolerate Trach collar @ 28% FiO2, PT to work with pt, awaiting LTAC placement  Subjective: Patient was little more alert and started tracking with eyes today.  Unable to move any extremity.  Assessment & Plan:   Active Problems:   Cardiogenic shock (HCC)   Cardiac arrest with successful resuscitation ([place]CC[place] Flash pulmonary edema ([place]CC[place] Cardiopulmonary arrest with successful resuscitation ([place]CC[place] Acute respiratory failure with hypoxia and hypercapnia (HCC)   Acute renal failure (HCC)   Transaminitis   Second degree heart block   Lactic acidosis   Aspiration pneumonia (HCC)   HFrEF (heart failure with reduced ejection fraction) (HCC)   Severe hypoxic-ischemic encephalopathy   SOB (shortness of breath)   Oropharyngeal dysphagia  Acute Hypoxic Respiratory Failure in the setting  of  Cardiac arrest, Pulmonary edema and Proteus Pneumonia -S/p Tracheostomy placement on 01/12/21 -Supplemental O2 as needed to maintain O2 sats >92% -Currently tolerating trach collar (has not required Vent since 01/16/21) Completed a course of antibiotics with ceftriaxone and Unasyn. Continue to have significant trach secretions. -Continue with aggressive pulmonary toilet -Continue with as needed bronchodilators  Cardiac Arrest  (V-tach)/NSTEMI Acute combined Systolic & Diastolic CHF (noniscehmic)  Clinically stable.  Echocardiogram with EF of 45 to 50%. -Continue with aspirin, statin, beta-blocker.  Essential hypertension. -Continue with beta-blocker and hydralazine  Anoxic encephalopathy.  Little more alert and tracking with eyes today. Poor prognosis due to anoxic brain injury but family would like to continue current management. Very poor quality of life. According to 1 neurology note- Given EEG which is not flatline or very low voltage, the patient's long-term neurological prognosis is likely to be better than originally anticipated.  - The cortical restricted diffusion seen on prior MRI may therefore also be reversible. This can be reassessed with a follow up MRI brain. -PT is recommending LTAC-waiting for placement  AKI.  Resolved  Nutrition.  S/p PEG tube -Continue with tube feed -We will decrease the frequency of free water from every 4 hourly to 6 hourly as sodium is trending down.   Objective: Vitals:   01/24/21 1200 01/24/21 1300 01/24/21 1400 01/24/21 1402  BP: (!) 107/38 (!) 110/36    Pulse: 64 63 71 72  Resp:    18  Temp:      TempSrc:      SpO2: 99% 100% 100% 100%  Weight:      Height:        Intake/Output Summary (Last 24 hours) at 01/24/2021 1512 Last data filed at 01/24/2021 0504 Gross per 24 hour  Intake 250 ml  Output 2025 ml  Net -1775 ml   Filed Weights   01/22/21 0500 01/23/21 0500 01/24/21 0454  Weight: 115.2 kg 115 kg 120.2 kg     Examination:  General.  Well-developed gentleman, in no acute distress.  Started tracking with eyes. Pulmonary.  Lungs clear bilaterally, normal respiratory effort. CV.  Regular rate and rhythm, no JVD, rub or murmur. Abdomen.  Soft, nontender, nondistended, BS positive. CNS.  Alert and tracking with eyes today, no extremity movement and not following other commands. Extremities.  No edema, no cyanosis, pulses intact and symmetrical. Psychiatry.  Judgment and insight appears impaired.  DVT prophylaxis: Lovenox Code Status: DNR Family Communication: No family at bedside today Disposition Plan:  Status is: Inpatient  Remains inpatient appropriate because:Inpatient level of care appropriate due to severity of illness   Dispo: The patient is from: Home              Anticipated d/c is to: LTAC              Patient currently is medically stable to d/c.   Difficult to place patient Yes              Level of care: Progressive Cardiac  All the records are reviewed and case discussed with Care Management/Social Worker. Management plans discussed with the patient, nursing and they are in agreement.  Consultants:   PCCM  Cardiology  Neurology  Procedures:  Antimicrobials:   Data Reviewed: I have personally reviewed following labs and imaging studies  CBC: Recent Labs  Lab 01/18/21 0512 01/22/21 0505 01/24/21 0455  WBC 7.1 9.9 10.1  HGB 8.9* 9.9* 9.5*  HCT 30.0* 32.1* 30.9*  MCV 82.4  79.5* 78.2*  PLT 206 231 341   Basic Metabolic Panel: Recent Labs  Lab 01/18/21 0512 01/19/21 0502 01/20/21 0404 01/21/21 0537 01/22/21 0505 01/23/21 0439  NA 145 143 145 142 141 138  K 4.0 3.7 4.2 4.2 4.1 3.9  CL 114* 113* 113* 110 110 109  CO2 21* _0 GLUCOSE 157* 165* 147* 143* 178* 140*  BUN 47* 44* 42* 40* 44* 43*  CREATININE 1.34* 1.40* 1.39* 1.21 1.31* 1.24  CALCIUM 8.6* 8.9 8.9 8.8* 8.4* 8.2*  MG 1.9 2.1 2.3 2.1  --   --   PHOS 3.5 4.0 4.8* 4.1  --   --     GFR: Estimated Creatinine Clearance: 85.8 mL/min (by C-G formula based on SCr of 1.24 mg/dL). Liver Function Tests: No results for input(s): AST, ALT, ALKPHOS, BILITOT, PROT, ALBUMIN in the last 168 hours. No results for input(s): LIPASE, AMYLASE in the last 168 hours. No results for input(s): AMMONIA in the last 168 hours. Coagulation Profile: No results for input(s): INR, PROTIME in the last 168 hours. Cardiac Enzymes: No results for input(s): CKTOTAL, CKMB, CKMBINDEX, TROPONINI in the last 168 hours. BNP (last 3 results) No results for input(s): PROBNP in the last 8760 hours. HbA1C: No results for input(s): HGBA1C in the last 72 hours. CBG: Recent Labs  Lab 01/23/21 1938 01/24/21 0002 01/24/21 0401 01/24/21 0725 01/24/21 1126  GLUCAP 115* 130* 141* 146* 182*   Lipid Profile: No results for input(s): CHOL, HDL, LDLCALC, TRIG, CHOLHDL, LDLDIRECT in the last 72 hours. Thyroid Function Tests: No results for input(s): TSH, T4TOTAL, FREET4, T3FREE, THYROIDAB in the last 72 hours. Anemia Panel: No results for input(s): VITAMINB12, FOLATE, FERRITIN, TIBC, IRON, RETICCTPCT in the last 72 hours. Sepsis Labs: No results for input(s): PROCALCITON, LATICACIDVEN in the last 168 hours.  No results found for this or any previous visit (from the past 240 hour(s)).   Radiology Studies: No results found.  Scheduled Meds: . aspirin  81 mg Per Tube Daily  . atorvastatin  80 mg Per Tube Daily  . carvedilol  12.5 mg Per Tube BID WC  . chlorhexidine gluconate (MEDLINE KIT)  15 mL Mouth Rinse BID  . Chlorhexidine Gluconate Cloth  6 each Topical Q0600  . docusate  100 mg Per Tube BID  . enoxaparin (LOVENOX) injection  0.5 mg/kg Subcutaneous Q24H  . famotidine  20 mg Per Tube Daily  . feeding supplement (PROSource TF)  45 mL Per Tube BID  . free water  250 mL Per Tube Q6H  . hydrALAZINE  25 mg Per Tube Q6H  . insulin aspart  0-15 Units Subcutaneous Q4H  . ipratropium-albuterol  3 mL  Nebulization TID  . mouth rinse  15 mL Mouth Rinse 10 times per day  . polyethylene glycol  17 g Per Tube Daily   Continuous Infusions: . feeding supplement (NEPRO CARB STEADY) 1,000 mL (01/24/21 0907)     LOS: 30 days   Time spent: 30 minutes. More than 50% of the time was spent in counseling/coordination of care  Lorella Nimrod, MD [facility]  If 7PM-7AM, please contact night-coverage [email or web address]  01/24/2021, 3:12 PM   This record has been created using Systems analyst. Errors have been sought and corrected,but may not always be located. Such creation errors do not reflect on the standard of care.

## 2021-01-25 LAB — GLUCOSE, CAPILLARY
Glucose-Capillary: 135 mg/dL — ABNORMAL HIGH (ref 70–99)
Glucose-Capillary: 131 mg/dL — ABNORMAL HIGH (ref 70–99)
Glucose-Capillary: 151 mg/dL — ABNORMAL HIGH (ref 70–99)
Glucose-Capillary: 141 mg/dL — ABNORMAL HIGH (ref 70–99)
Glucose-Capillary: 145 mg/dL — ABNORMAL HIGH (ref 70–99)

## 2021-01-25 MED ORDER — POLYVINYL ALCOHOL 1.4 % OP SOLN
2.0000 [drp] | OPHTHALMIC | Status: DC | PRN
  Administered 2021-01-25 (×2): 2 [drp] via OPHTHALMIC
  Filled 2021-01-25: qty 15

## 2021-01-25 NOTE — Progress Notes (Signed)
Pt will be transferring to room 239, report called to [employer]. Sister Marylene Land made aware.

## 2021-01-25 NOTE — Evaluation (Addendum)
Speech Language Pathology Evaluation Patient Details Name: Jeremy Browning MRN: [id number] DOB: 03-31-1961 Today's Date: 01/25/2021 Time: 7078-6754 SLP Time Calculation (min) (ACUTE ONLY): 30 min  Problem List:  Patient Active Problem List   Diagnosis Date Noted  . Oropharyngeal dysphagia   . SOB (shortness of breath)   . Severe hypoxic-ischemic encephalopathy   . HFrEF (heart failure with reduced ejection fraction) (HCC)   . Cardiogenic shock (HCC) 12/25/2020  . Cardiopulmonary arrest with successful resuscitation (HCC) 12/25/2020  . Acute renal failure (HCC) 12/25/2020  . Transaminitis 12/25/2020  . Second degree heart block 12/25/2020  . Lactic acidosis 12/25/2020  . Aspiration pneumonia (HCC) 12/25/2020  . Cardiac arrest with successful resuscitation (HCC)   . Flash pulmonary edema (HCC)   . Acute respiratory failure with hypoxia and hypercapnia (HCC)    Past Medical History:  Past Medical History:  Diagnosis Date  . Diabetes mellitus type II, non insulin dependent (HCC)   . Essential hypertension Jeremy Browning like  . Heart failure (HCC)    Unknown details.  By report no cardiac surgery  . Venous stasis dermatitis of left lower extremity    Past Surgical History:  Past Surgical History:  Procedure Laterality Date  . CARDIAC CATHETERIZATION    . LEFT HEART CATH AND CORONARY ANGIOGRAPHY N/A 12/25/2020   Procedure: LEFT HEART CATH AND CORONARY ANGIOGRAPHY;  Surgeon: Marykay Lex, MD;  Location: [facility] INVASIVE CV LAB;  Service: Cardiovascular;  Laterality: N/A;  . PEG PLACEMENT N/A 01/11/2021   Procedure: PERCUTANEOUS ENDOSCOPIC GASTROSTOMY (PEG) PLACEMENT;  Surgeon: Midge Minium, MD;  Location: [facility] ENDOSCOPY;  Service: Endoscopy;  Laterality: N/A;  . RIGHT HEART CATH N/A 12/25/2020   Procedure: RIGHT HEART CATH;  Surgeon: Marykay Lex, MD;  Location: [facility] INVASIVE CV LAB;  Service: Cardiovascular;  Laterality: N/A;  . TRACHEOSTOMY TUBE PLACEMENT N/A 01/12/2021   Procedure:  TRACHEOSTOMY;  Surgeon: Bud Face, MD;  Location: [facility] ORS;  Service: ENT;  Laterality: N/A;  . Unknown     HPI:  Pt is a 60 yo male that presented from home with respiratory distress 12/25/2020. Underwent defibrillation and CPR, emergently intubated. During admission pt has had a cardiac cath, peg tube and tracheostomy placed, weaned from vent 01/13/2021. 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 Mildly restricted diffusion involving the cortex more diffusely over the right cerebral convexity favored to reflect an acute MCA territory infarct over global hypoxic injury given unilaterality".  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 No further acute events and pt continues to tolerate Trach collar @ 28% FiO2. #8 Shiley trach, cuffed.   Assessment / Plan / Recommendation Clinical Impression  Pt was seen for informal Cognitive-linguistic evaluation today during co-treatments w/ OT/PT present initially. Pt has a tracheostomy baseline; PMV was placed during the evaluation -- pt appeared to adequately tolerate PMV placed w/ no overt discomfort or O2 desaturations. Per chart notes and assessment by Neurology, pt has been dx'd as a Level III on the [facility] Scales of Cognitive Functioning. Per MD notes during admit to the CCU, pt's MRI revealed brain injury w/ severe brain damage d/t hypoxia -- concern for anoxia.    At this assessment today, pt exhibited characteristics of the Ranchos level III scale c/b localized responses but no consistent verbal or nonverbal responses. When asked basic questions re:  self/environment in a Y/N format, he responded w/ a head nod "yes" intermittently during the session but the responses were not consistent or intentional -- sometimes a several second delay. He appeared to  demonstrate a vague awareness to the questions, then tasks that followed. Pt was asked to complete automatic speech tasks, sing along in song, and answer basic questions by OT and PT therapies -- pt gave ~4-5 mumbled phonations or verbalizations in low volume of speech; intelligibility was poor. The responses were not immediate to tasks/questions. Following basic, 1-step tasks were similar in presentation, inconsistent and delayed reponses often, and vague in his awareness and responses. Mod-Max verbal/tactile cues given much of the time.   Recommend skilled ST tx to increase pt's awareness to his environment and self to elicit more purposeful responses through structured tasks; education w/ Caregivers on TBI and supportive strategies to facilitate communication w/ pt. Pt will benefit from f/u w/ skilled ST services post discharge to next venue of care as well.     SLP Assessment  SLP Recommendation/Assessment: Patient needs continued Speech Lanaguage Pathology Services SLP Visit Diagnosis: Cognitive communication deficit (R41.841);Attention and concentration deficit;Frontal lobe and executive function deficit Attention and concentration deficit following: Cerebral infarction Frontal lobe and executive function deficit following: Cerebral infarction    Follow Up Recommendations  Inpatient Rehab    Frequency and Duration min 2x/week  4 weeks      SLP Evaluation Cognition  Overall Cognitive Status: Impaired/Different from baseline Arousal/Alertness: Awake/alert (nonverbal; inconsistent follow through) Orientation Level:  (unable to assess) Awareness: Impaired Safety/Judgment: Impaired Rancho Mirant Scales of Cognitive Functioning: Localized response       Comprehension  Auditory Comprehension Overall Auditory Comprehension: Impaired Yes/No Questions: Impaired Commands: Impaired Conversation: Simple Other Conversation Comments: inconsistent phonations Interfering Components:  Attention Visual Recognition/Discrimination Discrimination: Not tested Reading Comprehension Reading Status: Not tested    Expression Expression Primary Mode of Expression: Verbal (suspected at his baseline) Verbal Expression Overall Verbal Expression: Impaired Initiation: Impaired Automatic Speech: Name;Social Response;Counting;Day of week Level of Generative/Spontaneous Verbalization:  (impaired) Repetition: Impaired Naming: Impairment Confrontation: Impaired Other Naming Comments: inconsistent mumbled phonations noted x4-5; unsure of intention Pragmatics: Impairment Impairments: Abnormal affect;Eye contact (flat) Interfering Components: Attention Non-Verbal Means of Communication: Not applicable Other Verbal Expression Comments: PMV placed; tracheostomy Written Expression Written Expression: Not tested   Oral / Motor  Oral Motor/Sensory Function Overall Oral Motor/Sensory Function: Moderate impairment (bite reflex on swabs and tongue blade) Facial Symmetry: Within Functional Limits (grossly) Facial Strength:  (CNT) Lingual ROM:  (CNT) Lingual Symmetry:  (CNT) Lingual Strength:  (lingual bunching/humping to stim adequate) Velum:  (CNT) Mandible:  (CNT - min+ open mouth posture at baseline) Motor Speech Overall Motor Speech: Impaired (suspected) Respiration: Within functional limits Phonation: Low vocal intensity Resonance: Within functional limits Articulation:  (CNT) Intelligibility: Intelligibility reduced (but UTA fully) Word: 0-24% accurate Motor Planning:  (UTA) Interfering Components:  (Cognitive-communication deficits)   GO                      Jerilynn Som, MS, CCC-SLP Speech Language Pathologist Rehab Services [phone] [facility] 01/25/2021, 5:09 PM

## 2021-01-25 NOTE — Evaluation (Signed)
Occupational Therapy Re-Evaluation Patient Details Name: Jeremy Browning MRN: [id number] DOB: 02/24/1961 Today's Date: 01/25/2021    History of Present Illness Pt is a 60 yo male that presented from home with respiratory distress 12/25/2020. Underwent defibrillation and CPR, emergently intubated. During admission pt has had a cardiac cath, peg tube and tracheostomy placed, weaned from vent 5/26 AM. 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 Mildly restricted diffusion involving the cortex more diffusely over the right cerebral convexity favored to reflect an acute MCA territory infarct over global hypoxic injury given unilaterality".   Clinical Impression   Mr Scheuring was seen for OT re-evaluation to update goals, d/c plan, and frequency in the setting of improved alertness, meeting goals and of prolonged hopsitalization. Upon arrival to room pt reclined in bed, agreeable to co-tx with PT/SLP. Pt continues to demonstrate RLAS III with improved command following and participation in mobility. Pt engages BUE during mobility reactions and in response to pain during end range PROM, minimal proximal movement noted on command. Pt continues to require TOTAL A don B socks at bed level, however improved RLE movement noted in sitting, plan to assess LBD in sitting next session. MAX A x2 hand over hand face washing seated EOB - assist for sitting balance and pt participates with head movements. Pt making good progress toward goals. Pt continues to benefit from skilled OT services to maximize return to PLOF and minimize risk of future falls, injury, caregiver burden, and readmission. Goals and discharge recommendation updated to reflect pt improvement.      Follow Up Recommendations  CIR    Equipment Recommendations  Hospital bed    Recommendations for Other Services Rehab consult     Precautions / Restrictions Precautions Precautions: Fall Precaution  Comments: trach, PEG Restrictions Weight Bearing Restrictions: No      Mobility Bed Mobility Overal bed mobility: Needs Assistance Bed Mobility: Supine to Sit;Sit to Supine     Supine to sit: Total assist;+2 for physical assistance Sit to supine: Total assist;+2 for physical assistance   General bed mobility comments: Pt with LUE reach during transfer, some very minimal abdominal activation noted, ultimately still totalAx2 to complete    Transfers                 General transfer comment: not attempted focused on upright posture, sitting, endurance    Balance Overall balance assessment: Needs assistance Sitting-balance support: Feet unsupported;Bilateral upper extremity supported Sitting balance-Leahy Scale: Poor Sitting balance - Comments: Pt sat EOB for nearly 25-30 minutes. Intermittently able to maintain seated balance with CGA/supervision, fatigued and needing modA due to posterior lean. Protective righting reactions noted. Postural control: Posterior lean                                 ADL either performed or assessed with clinical judgement   ADL Overall ADL's : Needs assistance/impaired                                       General ADL Comments: TOTAL A don B socks at bed level. MAX A x2 hand over hand face washing seated EOB - assist for sitting balance and pt participates with head movements.                  Pertinent  Vitals/Pain Pain Assessment: Faces Faces Pain Scale: Hurts even more Pain Location: grimicing with PROM of extremities Pain Descriptors / Indicators: Grimacing;Guarding Pain Intervention(s): Limited activity within patient's tolerance;Monitored during session;Repositioned     Hand Dominance     Extremity/Trunk Assessment Upper Extremity Assessment Upper Extremity Assessment: LUE deficits/detail;RUE deficits/detail RUE Deficits / Details: edema improving, 2-/5 grossly RUE Coordination: decreased  gross motor;decreased fine motor LUE Deficits / Details: edema improving, 2-/5 grossly LUE Coordination: decreased fine motor;decreased gross motor   Lower Extremity Assessment Lower Extremity Assessment: Defer to PT evaluation       Communication     Cognition Arousal/Alertness: Awake/alert Behavior During Therapy: Flat affect Overall Cognitive Status: Impaired/Different from baseline Area of Impairment: Rancho level               Rancho Levels of Cognitive Functioning Ranch[place] Los Amigos Scales of Cognitive Functioning: Localized response               General Comments: Improved communication this date, pt attempts to verbalize responses c PM in place, continues to shake/nodd head appropriately to questions.   General Comments  SpO2 98% on 5L trach collar with PMV trial    Exercises Exercises: Other exercises General Exercises - Upper Extremity Shoulder Flexion: PROM;Both;Supine;5 reps Shoulder Extension: PROM;Both;Supine;5 reps Elbow Flexion: PROM;Both;Supine;5 reps Elbow Extension: PROM;Both;Supine;5 reps Wrist Flexion: PROM;Both;Supine;5 reps Wrist Extension: PROM;Both;Supine;5 reps Composite Extension: PROM;Both;Supine;5 reps General Exercises - Lower Extremity Ankle Circles/Pumps: AAROM;Strengthening;Both;10 reps;Seated;PROM Long Arc Quad: PROM;Seated;10 reps Other Exercises Other Exercises: PROM of BUE. Don/doff B resting hand splints. sup<>sit, sitting balance/tolerance              OT Goals(Current goals can be found in the care plan section) Acute Rehab OT Goals Patient Stated Goal: to improve pain OT Goal Formulation: With patient Time For Goal Achievement: 02/08/21 Potential to Achieve Goals: Fair ADL Goals Pt Will Perform Grooming: with mod assist;sitting Additional ADL Goal #1: Pt's care team will demo competency in b/l resting hand splint wear schedule and donning/doffing with no cues Additional ADL Goal #2: Pt will complete 1-2 step ADL  task with MOD A Additional ADL Goal #3: Pt will experience at least 30% reduction of b/l UE edema through manual techniques as evidenced by no pitting in hands.  OT Frequency: Min 2X/week           Co-evaluation PT/OT/SLP Co-Evaluation/Treatment: Yes Reason for Co-Treatment: Complexity of the patient's impairments (multi-system involvement) PT goals addressed during session: Mobility/safety with mobility OT goals addressed during session: ADL's and self-care SLP goals addressed during session: Communication;Cognition    AM-PAC OT "6 Clicks" Daily Activity     Outcome Measure Help from another person eating meals?: Total Help from another person taking care of personal grooming?: A Lot Help from another person toileting, which includes using toliet, bedpan, or urinal?: Total Help from another person bathing (including washing, rinsing, drying)?: Total Help from another person to put on and taking off regular upper body clothing?: Total Help from another person to put on and taking off regular lower body clothing?: Total 6 Click Score: 7   End of Session Equipment Utilized During Treatment: Oxygen Nurse Communication: Patient requests pain meds  Activity Tolerance: Patient tolerated treatment well Patient left: in bed;with bed alarm set  OT Visit Diagnosis: Other symptoms and signs involving the nervous system (R29.898);Other symptoms and signs involving cognitive function                Time: 1101-1139 OT  Time Calculation (min): 38 min Charges:  OT General Charges $OT Visit: 1 Visit OT Evaluation $OT Re-eval: 1 Re-eval OT Treatments $Self Care/Home Management : 8-22 mins   Kathie Dike, M.S. OTR/L  01/25/21, 2:39 PM  ascom [phone]

## 2021-01-25 NOTE — Progress Notes (Signed)
Physical Therapy Treatment Patient Details Name: Jeremy Browning MRN: [id number] DOB: 04/02/1961 Today's Date: 01/25/2021    History of Present Illness Pt is a 60 yo male that presented from home with respiratory distress 12/25/2020. Underwent defibrillation and CPR, emergently intubated. During admission pt has had a cardiac cath, peg tube and tracheostomy placed, weaned from vent 5/26 AM. 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 Mildly restricted diffusion involving the cortex more diffusely over the right cerebral convexity favored to reflect an acute MCA territory infarct over global hypoxic injury given unilaterality".    PT Comments    Patient demonstrated RLAS III today. Demonstrated progression ability to follow commands, righting reactions in sitting, and sitting balance. Pt intermittently able to follow simple one step commands and PM placed with occasional phonation noted. Pt had several bouts during ~25-30 minutes of sitting with fair sitting balance, able to maintain with BUE and BLE propped and CGA. With supine to sit, pt moved LUE to reach for bed rail during transfer but ultimately totalx2  For bed mobility. Pt still with R gaze preference but able to track to left with multimodal cueing. The patient has demonstrated improvement in alertness, balance, communication, command following from initial evaluation, discharge recommendation updated to CIR for high-intensity, post-acute rehab services. Per chart and CSW, pt lives with his brother and sister in law, and has a sister involved with his care.       Follow Up Recommendations  CIR     Equipment Recommendations  None recommended by PT    Recommendations for Other Services Speech consult     Precautions / Restrictions Precautions Precautions: Fall Precaution Comments: trach, PEG Restrictions Weight Bearing Restrictions: No    Mobility  Bed Mobility Overal bed mobility:  Needs Assistance Bed Mobility: Supine to Sit;Sit to Supine     Supine to sit: Total assist;+2 for physical assistance Sit to supine: Total assist;+2 for physical assistance   General bed mobility comments: Pt with LUE reach during transfer, some very minimal abdominal activation noted, ultimately still totalAx2 to complete    Transfers                 General transfer comment: not attempted focused on upright posture, sitting, endurance  Ambulation/Gait                 Stairs             Wheelchair Mobility    Modified Rankin (Stroke Patients Only)       Balance Overall balance assessment: Needs assistance Sitting-balance support: Feet unsupported;Bilateral upper extremity supported Sitting balance-Leahy Scale: Poor Sitting balance - Comments: Pt sat EOB for nearly 25-30 minutes. Intermittently able to maintain seated balance with CGA/supervision, fatigued and needing modA due to posterior lean. Protective righting reactions noted. Postural control: Posterior lean                                  Cognition Arousal/Alertness: Awake/alert Behavior During Therapy: Flat affect Overall Cognitive Status: Impaired/Different from baseline Area of Impairment: Rancho level               Rancho Levels of Cognitive Functioning [place] Amigos Scales of Cognitive Functioning: Localized response               General Comments: patient able to follow single step command with activating right foot movement in sitting  position. patient intermittently blinking or nodding to commands and appropriately when asked questions. several attempts at vocalizing with PM in place      Exercises General Exercises - Lower Extremity Ankle Circles/Pumps: AAROM;Strengthening;Both;10 reps;Seated;PROM Long Arc Quad: PROM;Seated;10 reps    General Comments        Pertinent Vitals/Pain Pain Assessment: Faces Faces Pain Scale: Hurts even more Pain  Location: grimicing with PROM of extremities Pain Descriptors / Indicators: Grimacing;Guarding Pain Intervention(s): Limited activity within patient's tolerance;Monitored during session;Repositioned    Home Living                      Prior Function            PT Goals (current goals can now be found in the care plan section) Progress towards PT goals: Progressing toward goals    Frequency    Min 2X/week      PT Plan Discharge plan needs to be updated    Co-evaluation PT/OT/SLP Co-Evaluation/Treatment: Yes Reason for Co-Treatment: Complexity of the patient's impairments (multi-system involvement);Necessary to address cognition/behavior during functional activity;For patient/therapist safety;To address functional/ADL transfers PT goals addressed during session: Mobility/safety with mobility;Balance;Strengthening/ROM OT goals addressed during session: Strengthening/ROM SLP goals addressed during session: Communication;Cognition    AM-PAC PT "6 Clicks" Mobility   Outcome Measure  Help needed turning from your back to your side while in a flat bed without using bedrails?: Total Help needed moving from lying on your back to sitting on the side of a flat bed without using bedrails?: Total Help needed moving to and from a bed to a chair (including a wheelchair)?: Total Help needed standing up from a chair using your arms (e.g., wheelchair or bedside chair)?: Total Help needed to walk in hospital room?: Total Help needed climbing 3-5 steps with a railing? : Total 6 Click Score: 6    End of Session Equipment Utilized During Treatment: Oxygen (trach collar) Activity Tolerance: Patient tolerated treatment well Patient left: in bed;with call bell/phone within reach;Other (comment) (speech in room) Nurse Communication: Mobility status PT Visit Diagnosis: Other abnormalities of gait and mobility (R26.89);Muscle weakness (generalized) (M62.81);Other symptoms and signs  involving the nervous system (R29.898)     Time: 5697-9480 PT Time Calculation (min) (ACUTE ONLY): 38 min  Charges:  $Neuromuscular Re-education: 8-22 mins                     Olga Coaster PT, DPT 12:47 PM,01/25/21

## 2021-01-25 NOTE — Progress Notes (Signed)
Rehab Admissions Coordinator Note:  Patient was screened by Clois Dupes for appropriateness for an Inpatient Acute Rehab admit at [facility] in [name] per change in therapy recommendation. Patient not yet at a level to be considered for intense inpt rehab/Cir.  Noted RLAS III. At this time, we are recommending other rehab venues to be pursued. We will follow his progress at a distance.  Clois Dupes RN MSN 01/25/2021, 12:51 PM  I can be reached at [phone].

## 2021-01-25 NOTE — Evaluation (Signed)
Passy-Muir Speaking Valve - Evaluation Patient Details  Name: Jeremy Browning MRN: [id number] Date of Birth: [birth year]  Today's Date: 01/25/2021 Time: 2706-2376 SLP Time Calculation (min) (ACUTE ONLY): 40 min  Past Medical History:  Past Medical History:  Diagnosis Date  . Diabetes mellitus type II, non insulin dependent (HCC)   . Essential hypertension Irving Burton like  . Heart failure (HCC)    Unknown details.  By report no cardiac surgery  . Venous stasis dermatitis of left lower extremity    Past Surgical History:  Past Surgical History:  Procedure Laterality Date  . CARDIAC CATHETERIZATION    . LEFT HEART CATH AND CORONARY ANGIOGRAPHY N/A 12/25/2020   Procedure: LEFT HEART CATH AND CORONARY ANGIOGRAPHY;  Surgeon: Marykay Lex, MD;  Location: [facility] INVASIVE CV LAB;  Service: Cardiovascular;  Laterality: N/A;  . PEG PLACEMENT N/A 01/11/2021   Procedure: PERCUTANEOUS ENDOSCOPIC GASTROSTOMY (PEG) PLACEMENT;  Surgeon: Midge Minium, MD;  Location: [facility] ENDOSCOPY;  Service: Endoscopy;  Laterality: N/A;  . RIGHT HEART CATH N/A 12/25/2020   Procedure: RIGHT HEART CATH;  Surgeon: Marykay Lex, MD;  Location: [facility] INVASIVE CV LAB;  Service: Cardiovascular;  Laterality: N/A;  . TRACHEOSTOMY TUBE PLACEMENT N/A 01/12/2021   Procedure: TRACHEOSTOMY;  Surgeon: Bud Face, MD;  Location: [facility] ORS;  Service: ENT;  Laterality: N/A;  . Unknown     HPI:  Pt is a 60 yo male that presented from home with respiratory distress 12/25/2020. Underwent defibrillation and CPR, emergently intubated. During admission pt has had a cardiac cath, peg tube and tracheostomy placed, weaned from vent 01/13/2021. 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 Mildly restricted diffusion involving the cortex more diffusely over the right cerebral convexity favored to reflect an acute MCA territory infarct over global hypoxic injury given unilaterality".  Patient has long  complicated course while on ventilator, concern of anoxic brain injury with cardiac arrest, trach was placed on 01/12/2021 and a PEG tube was placed on 01/11/2021. No further acute events and pt continues to tolerate Trach collar @ 28% FiO2. #8 Shiley trach, cuffed.   Assessment / Plan / Recommendation Clinical Impression  Pt seen for Passy-muir valve evaluation today. Pt is on trach collar O2 support at 28% FiO2 w/ a Shiley #8, cuffed trach. Pt's cuff has been deflated at Baseline for ~8 days tolerating well. Tracheal secretions minimal w/ PRN suctioning. Pt awakened to verbal/tactile stim opening eyes fully post face washing. Pt was nonverbal initially but did nod his head (moreso to "yes" questions) inconsistently when asked basic questions.  Once pt was positioned more upright in bed, trach site/area inspected; ensured cuff was deflated. RR: 22, O2 sats 97%, HR 77. Finger occlusion trials revealed tolerance for intermittent occlusion; pt initially did not make any attempts at phonation or verbalizations despite Max verbal/tactile cues and model by SLP.   Next part of the assessment was completed during PT/OT session -- PMV was placed while pt sat EOB w/ therapies supporting. PMV placement was tolerated for 25 mins w/out noted O2 desaturation, overt discomfort or effort in respirations, no use of accessary muscles. Pt was carefully monitored for any change in presentation by SLP. When asked directly in a Y/N format if pt felt his breathing was "OK", he responded w/ a mild head nod "yes" though unsure of intent d/t the inconsistency of responses. Intermittently during the session, pt was asked to complete automatic speech tasks and answer basic questions by SLP, OT, and  PT in order to assess vocalizations, breath support, and PMV tolerance -- pt only gave ~4-5 mumbled, intermittent phonations or verbalizations in low volume of speech; intelligibility was poor. This appeared to be an impact from a  Cognitive-linguistic deficits, not from poor tolerance of the PMV or lack of vocal cord function. Pt was noted to Cough and Sneeze during session w/ appropriate vocal cord contact and force from a pulmonary standpoint. After 25 mins, PMV was removed and pt rested in bed closing eyes often -- fatigue factor?  Pt appears to be able to adequately tolerate PMV placement w/out O2 desaturations or respiratory discomfort on trach collar(cuff deflated at baseline). Recommend continued use of PMV wear w/ encouragement for verbal communication and engagement w/ others and during therapy sessions as Cognitive-linguistic goals are established next 1-2 days. SLP Visit Diagnosis: Aphonia (R49.1) (tracheostomy)    SLP Assessment  Patient needs continued Speech Lanaguage Pathology Services    Follow Up Recommendations  Inpatient Rehab    Frequency and Duration min 2x/week  4 weeks    PMSV Trial PMSV was placed for: 25 mins Able to redirect subglottic air through upper airway: Yes (inconsistent d/t cognitive-linguistic deficits) Able to Attain Phonation: Yes (brief, intermittent) Voice Quality: Low vocal intensity (mumbled) Able to Expectorate Secretions: No attempts Level of Secretion Expectoration with PMSV: Not observed Breath Support for Phonation: Adequate Intelligibility: Unable to assess (comment) (no consistent attempts made by pt) Respirations During Trial: 22 SpO2 During Trial: 97 % Pulse During Trial: 79 Behavior: Alert;Confused;Poor eye contact (inconsistent attempts to communicate)   Tracheostomy Tube  Additional Tracheostomy Tube Assessment Trach Collar Period: ~8 days now Secretion Description: min Frequency of Tracheal Suctioning: PRN Level of Secretion Expectoration: Tracheal    Vent Dependency  Vent Dependent: No FiO2 (%): 28 %    Cuff Deflation Trial  GO Tolerated Cuff Deflation: Yes Length of Time for Cuff Deflation Trial: baseline Behavior: Alert;Confused;Poor eye  contact Cuff Deflation Trial - Comments: deflated at baseline tolerating well        Cindia Hustead 01/25/2021, 4:35 PM Jerilynn Som, MS, CCC-SLP Speech Language Pathologist Rehab Services [phone]

## 2021-01-25 NOTE — Progress Notes (Signed)
PROGRESS NOTE  ICU transfer  Jeremy Browning  SHF:[id number] DOB: 1961-06-29 DOA: 12/25/2020 PCP: No primary care provider on file.   Brief Narrative: Taken from prior notes.  Prolonged hospitalization. Jeremy Browning is a 60 year old male with obesity, diabetes mellitus, hypertension, and combined diastolic and systolic heart failure who presented to the emergency department yesterday in respiratory distress.  In the emergency department he underwent emergent endotracheal intubation.  In the ER he also had an episode of ventricular tachycardia necessitating epinephrine x2 and cardioversion.  He was evaluated by the cardiologist and secondary to concern for acute coronary syndrome he was taken to the Cath Lab.  He was found to have patent coronary arteries with a cardiac index of approximately 2.8 and cardiac output of nearly 6.  Pulmonary capillary wedge pressure was approximately 30.  He presented hypertensive when in respiratory distress, but after intubation and sedation developed hypotension ultimately necessitating norepinephrine drip to help maintain adequate mean arterial pressure.    Patient has long complicated course on ventilator, concern of anoxic brain injury with cardiac arrest, trach was placed on 01/12/2021 and a PEG tube was placed on 01/11/2021.  Multiple family meetings but family remained very optimistic??  For recovery and wants everything needs to be done. Now able to weaned off from ventilator but continues to require 5 L of oxygen at 28% through trach. Remained pretty much unresponsive, just open eyes and not following any commands. PT is recommending LTAC-TOC is working on it. TRH to resume care on 01/21/2021  Significant events.  Copied from prior notes /7 severe resp failure 5/8remains on Vent 5/9remains on vent , multiorgan failureMRI shows brain injury 5/10remains on vent, multiorgan failure, brother updated 5/11severe resp failure 5/33filed weaning trials due to  severe brain damage 5/1107fled weaning trials severe hypoxia dn vent dyssynchrony, sister updated 5/14vent support, severe brain damage, prognosis is poor 5/16unresponsive on wake up assessment, becomes tachypneic when sedation is lightened 5/17no purposeful movement on wake up assessment, becomes tachypneic, asynchronous with vent 5/18remains critically ill, severe brain damage, patient made DNR 5/2055fly decided on TRACH/PEG tube 5/23severe brain damage, no further imaging needed 5/24severe brain damage, [facility]26 s/p trach PEG tube 01/14/21- no changes today, awaitingj permanent placement  01/16/21- patient is stable without changes today. Plan for LTAC when able 01/17/21-met with family at bedside AngLevada Dyd SheFreda Munroisters of patient) they are hopeful for a recovery and appreciative of care. Questions answered medical plan reviewed.  01/18/21- patient stable on trache #8 - 28% FiO2, respond to pain with eye opening, awaiting LTACH assignment.  PiCC+. Reviewed plan with family including brother, SIL and two sisters.  01/20/21: No acute events, continues to tolerate Trach collar @ 28% FiO2, PT to work with pt, awaiting LTAC placement 01/25/21: Apparently declined by LTAC TOC is looking for more options.  Patient is becoming little more responsive, he started tracking with eyes and today shaking head with yes or no, some movements on right lower extremity.  Subjective: Patient was more alert and responsive today.  He was tracking with eyes and nodding head in yes or no position.  He noted yes about right lower extremity pain, no for any other pain.  Per nursing staff he started withdrawing right lower extremity and appears uncomfortable when moved.  Assessment & Plan:   Active Problems:   [facility] ([facility] Cardiac [facility] with successful resuscitation ([facility] Flash pulmonary edema ([facility] Cardiopulmonary arrest with  successful resuscitation  ([place])   Acute respiratory failure with hypoxia and hypercapnia (HCC)   Acute renal failure (HCC)   Transaminitis   Second degree heart block   Lactic acidosis   Aspiration pneumonia (HCC)   HFrEF (heart failure with reduced ejection fraction) (HCC)   Severe hypoxic-ischemic encephalopathy   SOB (shortness of breath)   Oropharyngeal dysphagia  Acute Hypoxic Respiratory Failure in the setting of Cardiac arrest, Pulmonary edema and Proteus Pneumonia -S/p Tracheostomy placement on 01/12/21 -Supplemental O2 as needed to maintain O2 sats >92% -Currently tolerating trach collar (has not required Vent since 01/16/21) Completed a course of antibiotics with ceftriaxone and Unasyn. Continue to have significant trach secretions. -Continue with aggressive pulmonary toilet -Continue with as needed bronchodilators  Cardiac Arrest  (V-tach)/NSTEMI Acute combined Systolic & Diastolic CHF (noniscehmic)  Clinically stable.  Echocardiogram with EF of 45 to 50%. -Continue with aspirin, statin, beta-blocker.  Essential hypertension. -Continue with beta-blocker and hydralazine  Anoxic encephalopathy.  Little more alert and tracking with eyes, started giving some response. Poor prognosis due to anoxic brain injury but family would like to continue current management. Very poor quality of life. According to 1 neurology note- Given EEG which is not flatline or very low voltage, the patient's long-term neurological prognosis is likely to be better than originally anticipated.  - The cortical restricted diffusion seen on prior MRI may therefore also be reversible. This can be reassessed with a follow up MRI brain. -PT is recommending LTAC-apparently LTAC declined him, most likely insurance issues. -PT recommended CIR today as he started giving some response but they were recommending SNF as he might not be able to participate with that intense therapy at this time.  AKI.  Resolved  Nutrition.  S/p PEG  tube -Continue with tube feed   Objective: Vitals:   01/25/21 0436 01/25/21 0700 01/25/21 0800 01/25/21 1005  BP:  107/66 (!) 106/34 (!) 134/58  Pulse:  69 72 66  Resp:  (!) 22 (!) 23 20  Temp:   98.3 F (36.8 C) 99.7 F (37.6 C)  TempSrc:   Oral Oral  SpO2:  100% 100% 100%  Weight: 119.9 kg     Height:        Intake/Output Summary (Last 24 hours) at 01/25/2021 1509 Last data filed at 01/25/2021 0435 Gross per 24 hour  Intake 970 ml  Output 1625 ml  Net -655 ml   Filed Weights   01/23/21 0500 01/24/21 0454 01/25/21 0436  Weight: 115 kg 120.2 kg 119.9 kg    Examination:  General.  Chronically ill-appearing, well-developed gentleman, in no acute distress. Pulmonary.  Lungs clear bilaterally, normal respiratory effort. CV.  Regular rate and rhythm, no JVD, rub or murmur. Abdomen.  Soft, nontender, nondistended, BS positive. CNS.  Alert and started giving some more response, started some movement in right lower extremity. Extremities.  No edema, no cyanosis, pulses intact and symmetrical. Psychiatry.  Judgment and insight appears impaired.  DVT prophylaxis: Lovenox Code Status: DNR Family Communication: Called sister with no response Disposition Plan:  Status is: Inpatient  Remains inpatient appropriate because:Inpatient level of care appropriate due to severity of illness   Dispo: The patient is from: Home              Anticipated d/c is to: To be determined now              Patient currently is medically stable to d/c.   Difficult to place patient Yes  Level of care: Progressive Cardiac  All the records are reviewed and case discussed with Care Management/Social Worker. Management plans discussed with the patient, nursing and they are in agreement.  Consultants:   PCCM  Cardiology  Neurology  Procedures:  Antimicrobials:   Data Reviewed: I have personally reviewed following labs and imaging studies  CBC: Recent Labs  Lab 01/22/21 0505  01/24/21 0455  WBC 9.9 10.1  HGB 9.9* 9.5*  HCT 32.1* 30.9*  MCV 79.5* 78.2*  PLT 231 347   Basic Metabolic Panel: Recent Labs  Lab 01/19/21 0502 01/20/21 0404 01/21/21 0537 01/22/21 0505 01/23/21 0439  NA 143 145 142 141 138  K 3.7 4.2 4.2 4.1 3.9  CL 113* 113* 110 110 109  CO2 '23 24 24 24 23  ' GLUCOSE 165* 147* 143* 178* 140*  BUN 44* 42* 40* 44* 43*  CREATININE 1.40* 1.39* 1.21 1.31* 1.24  CALCIUM 8.9 8.9 8.8* 8.4* 8.2*  MG 2.1 2.3 2.1  --   --   PHOS 4.0 4.8* 4.1  --   --    GFR: Estimated Creatinine Clearance: 85.6 mL/min (by C-G formula based on SCr of 1.24 mg/dL). Liver Function Tests: No results for input(s): AST, ALT, ALKPHOS, BILITOT, PROT, ALBUMIN in the last 168 hours. No results for input(s): LIPASE, AMYLASE in the last 168 hours. No results for input(s): AMMONIA in the last 168 hours. Coagulation Profile: No results for input(s): INR, PROTIME in the last 168 hours. Cardiac Enzymes: No results for input(s): CKTOTAL, CKMB, CKMBINDEX, TROPONINI in the last 168 hours. BNP (last 3 results) No results for input(s): PROBNP in the last 8760 hours. HbA1C: No results for input(s): HGBA1C in the last 72 hours. CBG: Recent Labs  Lab 01/24/21 1920 01/24/21 2349 01/25/21 0339 01/25/21 0734 01/25/21 1140  GLUCAP 155* 149* 135* 131* 151*   Lipid Profile: No results for input(s): CHOL, HDL, LDLCALC, TRIG, CHOLHDL, LDLDIRECT in the last 72 hours. Thyroid Function Tests: No results for input(s): TSH, T4TOTAL, FREET4, T3FREE, THYROIDAB in the last 72 hours. Anemia Panel: No results for input(s): VITAMINB12, FOLATE, FERRITIN, TIBC, IRON, RETICCTPCT in the last 72 hours. Sepsis Labs: No results for input(s): PROCALCITON, LATICACIDVEN in the last 168 hours.  No results found for this or any previous visit (from the past 240 hour(s)).   Radiology Studies: No results found.  Scheduled Meds: . aspirin  81 mg Per Tube Daily  . atorvastatin  80 mg Per Tube Daily   . carvedilol  12.5 mg Per Tube BID WC  . chlorhexidine gluconate (MEDLINE KIT)  15 mL Mouth Rinse BID  . Chlorhexidine Gluconate Cloth  6 each Topical Q0600  . docusate  100 mg Per Tube BID  . enoxaparin (LOVENOX) injection  0.5 mg/kg Subcutaneous Q24H  . famotidine  20 mg Per Tube Daily  . feeding supplement (PROSource TF)  45 mL Per Tube BID  . free water  250 mL Per Tube Q6H  . hydrALAZINE  25 mg Per Tube Q6H  . insulin aspart  0-15 Units Subcutaneous Q4H  . ipratropium-albuterol  3 mL Nebulization TID  . mouth rinse  15 mL Mouth Rinse 10 times per day  . polyethylene glycol  17 g Per Tube Daily   Continuous Infusions: . feeding supplement (NEPRO CARB STEADY) 1,000 mL (01/25/21 0435)     LOS: 31 days   Time spent: 30 minutes. More than 50% of the time was spent in counseling/coordination of care  Lorella Nimrod, MD [facility]  If 7PM-7AM, please contact night-coverage [email or web address]  01/25/2021, 3:09 PM   This record has been created using Systems analyst. Errors have been sought and corrected,but may not always be located. Such creation errors do not reflect on the standard of care.

## 2021-01-25 NOTE — Progress Notes (Signed)
Patient is alert but unable to determine if he is oriented due to the patient being trached. Patient received a bath tonight which he tolerated well. Patient remained afebrile throughout the shift. Patient vitals remained WDL Patient has been able to shake his head no when asking if he is in any pain. Will continue patient care.

## 2021-01-25 NOTE — Consult Note (Signed)
Physical Medicine and Rehabilitation Consult Reason for Consult: Anoxic TBI Referring Physician: Arnetha Courser, MD   HPI: Jeremy Browning is a 60 y.o. male with obesity, DM, HTN, and CHF who presented to the ED in respiratory distress and required intubation. CCB Vtach and anoxic brain injury. Physical Medicine & Rehabilitation was consulted to assess candidacy for CIR. Patient is currently lethargic. Currently unable to transfer.    Review of Systems  Unable to perform ROS: Acuity of condition  Eyes: Negative.    Past Medical History:  Diagnosis Date  . Diabetes mellitus type II, non insulin dependent (HCC)   . Essential hypertension Irving Burton like  . Heart failure (HCC)    Unknown details.  By report no cardiac surgery  . Venous stasis dermatitis of left lower extremity    Past Surgical History:  Procedure Laterality Date  . CARDIAC CATHETERIZATION    . LEFT HEART CATH AND CORONARY ANGIOGRAPHY N/A 12/25/2020   Procedure: LEFT HEART CATH AND CORONARY ANGIOGRAPHY;  Surgeon: Marykay Lex, MD;  Location: [facility] INVASIVE CV LAB;  Service: Cardiovascular;  Laterality: N/A;  . PEG PLACEMENT N/A 01/11/2021   Procedure: PERCUTANEOUS ENDOSCOPIC GASTROSTOMY (PEG) PLACEMENT;  Surgeon: Midge Minium, MD;  Location: [facility] ENDOSCOPY;  Service: Endoscopy;  Laterality: N/A;  . RIGHT HEART CATH N/A 12/25/2020   Procedure: RIGHT HEART CATH;  Surgeon: Marykay Lex, MD;  Location: [facility] INVASIVE CV LAB;  Service: Cardiovascular;  Laterality: N/A;  . TRACHEOSTOMY TUBE PLACEMENT N/A 01/12/2021   Procedure: TRACHEOSTOMY;  Surgeon: Bud Face, MD;  Location: [facility] ORS;  Service: ENT;  Laterality: N/A;  . Unknown     Family History  Family history unknown: Yes   Social History:  has no history on file for tobacco use, alcohol use, and drug use. Allergies: Not on File Medications Prior to Admission  Medication Sig Dispense Refill  . aspirin EC 81 MG tablet Take 81 mg by mouth daily. Swallow  whole.    Marland Kitchen atorvastatin (LIPITOR) 80 MG tablet Take 80 mg by mouth daily.    . carvedilol (COREG) 12.5 MG tablet Take 12.5 mg by mouth 2 (two) times daily with a meal.    . furosemide (LASIX) 80 MG tablet Take 80 mg by mouth daily.    Marland Kitchen losartan (COZAAR) 50 MG tablet Take 50 mg by mouth daily.      Home: Home Living Additional Comments: pt unable to answer questions at this time, no family available. Will attempt to f/u with family as able.  Functional History:   Functional Status:  Mobility: Bed Mobility Overal bed mobility: Needs Assistance Bed Mobility: Supine to Sit,Sit to Supine Rolling: Total assist Supine to sit: Total assist,+2 for physical assistance Sit to supine: Total assist,+2 for physical assistance General bed mobility comments: Pt with LUE reach during transfer, some very minimal abdominal activation noted, ultimately still totalAx2 to complete Transfers General transfer comment: not attempted focused on upright posture, sitting, endurance      ADL: ADL Overall ADL's : Needs assistance/impaired General ADL Comments: TOTAL A don B socks at bed level. MAX A x2 hand over hand face washing seated EOB - assist for sitting balance and pt participates with head movements.  Cognition: Cognition Overall Cognitive Status: Impaired/Different from baseline Orientation Level: Intubated/Tracheostomy - Unable to assess [place] Scales of Cognitive Functioning: Localized response Cognition Arousal/Alertness: Awake/alert Behavior During Therapy: Flat affect Overall Cognitive Status: Impaired/Different from baseline Area of Impairment: Rancho level General Comments: Improved  communication this date, pt attempts to verbalize responses c PM in place, continues to shake/nodd head appropriately to questions.  Blood pressure (!) 134/58, pulse 66, temperature 99.7 F (37.6 C), temperature source Oral, resp. rate 20, height 6' 0.76" (1.848 m), weight 119.9 kg, SpO2 100  %. Physical Exam  Gen: no distress, normal appearing HEENT: oral mucosa pink and moist, NCAT Cardio: Reg rate Chest: normal effort, normal rate of breathing Abd: soft, non-distended Ext: no edema Psych: pleasant, normal affect Skin: intact Neuro: Lethargic Musculoskeletal: Unable to follow MMT commands  Results for orders placed or performed during the hospital encounter of 12/25/20 (from the past 24 hour(s))  Glucose, capillary     Status: Abnormal   Collection Time: 01/24/21  7:20 PM  Result Value Ref Range   Glucose-Capillary 155 (H) 70 - 99 mg/dL  Glucose, capillary     Status: Abnormal   Collection Time: 01/24/21 11:49 PM  Result Value Ref Range   Glucose-Capillary 149 (H) 70 - 99 mg/dL  Glucose, capillary     Status: Abnormal   Collection Time: 01/25/21  3:39 AM  Result Value Ref Range   Glucose-Capillary 135 (H) 70 - 99 mg/dL  Glucose, capillary     Status: Abnormal   Collection Time: 01/25/21  7:34 AM  Result Value Ref Range   Glucose-Capillary 131 (H) 70 - 99 mg/dL  Glucose, capillary     Status: Abnormal   Collection Time: 01/25/21 11:40 AM  Result Value Ref Range   Glucose-Capillary 151 (H) 70 - 99 mg/dL   No results found.  Assessment/Plan: 1) Anoxic brain injury: -patient is currently not at appropriate level for CIR due to severity of his anoxic brain injury. Thank you for this consult and your excellent care. We will continue to follow patient as he progresses in hopes that he may achieve CIR level.   Horton Chin, MD 01/25/2021

## 2021-01-26 LAB — GLUCOSE, CAPILLARY
Glucose-Capillary: 125 mg/dL — ABNORMAL HIGH (ref 70–99)
Glucose-Capillary: 136 mg/dL — ABNORMAL HIGH (ref 70–99)
Glucose-Capillary: 143 mg/dL — ABNORMAL HIGH (ref 70–99)
Glucose-Capillary: 144 mg/dL — ABNORMAL HIGH (ref 70–99)
Glucose-Capillary: 144 mg/dL — ABNORMAL HIGH (ref 70–99)
Glucose-Capillary: 146 mg/dL — ABNORMAL HIGH (ref 70–99)

## 2021-01-26 LAB — CBC
HCT: 28.7 % — ABNORMAL LOW (ref 39.0–52.0)
Hemoglobin: 9.2 g/dL — ABNORMAL LOW (ref 13.0–17.0)
MCH: 25.1 pg — ABNORMAL LOW (ref 26.0–34.0)
MCHC: 32.1 g/dL (ref 30.0–36.0)
MCV: 78.4 fL — ABNORMAL LOW (ref 80.0–100.0)
Platelets: 257 10*3/uL (ref 150–400)
RBC: 3.66 MIL/uL — ABNORMAL LOW (ref 4.22–5.81)
RDW: 15.2 % (ref 11.5–15.5)
WBC: 10.5 10*3/uL (ref 4.0–10.5)
nRBC: 0 % (ref 0.0–0.2)

## 2021-01-26 LAB — BASIC METABOLIC PANEL
Anion gap: 5 (ref 5–15)
BUN: 43 mg/dL — ABNORMAL HIGH (ref 6–20)
CO2: 23 mmol/L (ref 22–32)
Calcium: 8.3 mg/dL — ABNORMAL LOW (ref 8.9–10.3)
Chloride: 108 mmol/L (ref 98–111)
Creatinine, Ser: 1.25 mg/dL — ABNORMAL HIGH (ref 0.61–1.24)
GFR, Estimated: >60 mL/min (ref >60)
Glucose, Bld: 152 mg/dL — ABNORMAL HIGH (ref 70–99)
Potassium: 4 mmol/L (ref 3.5–5.1)
Sodium: 136 mmol/L (ref 135–145)

## 2021-01-26 MED ORDER — FREE WATER
200.0000 mL | Freq: Four times a day (QID) | Status: DC
  Administered 2021-01-27 – 2021-02-15 (×74): 200 mL

## 2021-01-26 NOTE — NC FL2 (Signed)
[place] LEVEL OF CARE SCREENING TOOL     IDENTIFICATION  Patient Name: Jeremy Browning Birthdate: [birth year] Sex: male Admission Date (Current Location): 12/25/2020  [place] and [place] Number:  Selena Lesser [id number] Facility and Address:  [facility], [address] [place], [place] [address]      Provider Number: [id number]  Attending Physician Name and Address:  Caren Griffins, MD  Relative Name and Phone Number:  Jarron, [facility]) [phone], Venia Carbon (Brother) [phone], Khadar, Monger (S-I-L)  [phone]    Current Level of Care: Hospital Recommended Level of Care: [place] Prior Approval Number:    Date Approved/Denied:   PASRR Number: Pending  Discharge Plan: SNF    Current Diagnoses: Patient Active Problem List   Diagnosis Date Noted  . Oropharyngeal dysphagia   . SOB (shortness of breath)   . Severe hypoxic-ischemic encephalopathy   . HFrEF (heart failure with reduced ejection fraction) ([place])   . Cardiogenic shock ([place]) 12/25/2020  . Cardiopulmonary arrest with successful resuscitation ([place]) 12/25/2020  . Acute renal failure ([place]) 12/25/2020  . Transaminitis 12/25/2020  . Second degree heart block 12/25/2020  . Lactic acidosis 12/25/2020  . Aspiration pneumonia ([place]) 12/25/2020  . Cardiac arrest with successful resuscitation ([place])   . Flash pulmonary edema ([place])   . Acute respiratory failure with hypoxia and hypercapnia (HCC)     Orientation RESPIRATION BLADDER Height & Weight      (UTA)  Tracheostomy (5L) Incontinent Weight: 257 lb 15 oz (117 kg) Height:  6' (182.9 cm)  BEHAVIORAL SYMPTOMS/MOOD NEUROLOGICAL BOWEL NUTRITION STATUS      Incontinent Diet (NPO at this time)  AMBULATORY STATUS COMMUNICATION OF NEEDS Skin   Extensive Assist Verbally Surgical wounds (closed incision, neck. open wound left knee)                       Personal Care Assistance Level of  Assistance  Bathing,Feeding,Dressing Bathing Assistance: Maximum assistance Feeding assistance: Limited assistance Dressing Assistance: Maximum assistance Total Care Assistance: Maximum assistance   Functional Limitations Info  Sight,Hearing,Speech Sight Info: Adequate Hearing Info: Adequate Speech Info: Adequate    SPECIAL CARE FACTORS FREQUENCY  OT (By licensed OT),PT (By licensed PT)     PT Frequency: 5x OT Frequency: 5x            Contractures Contractures Info: Not present    Additional Factors Info  Code Status,Allergies Code Status Info: DNR Allergies Info: none on file           Current Medications (01/26/2021):  This is the current hospital active medication list Current Facility-Administered Medications  Medication Dose Route Frequency Provider Last Rate Last Admin  . acetaminophen (TYLENOL) 160 MG/5ML solution 650 mg  650 mg Per Tube Q4H PRN Flora Lipps, MD   650 mg at 01/25/21 2207  . albuterol (PROVENTIL) (2.5 MG/3ML) 0.083% nebulizer solution 2.5 mg  2.5 mg Nebulization Q4H PRN Rust-Chester, Britton L, NP   2.5 mg at 01/14/21 1935  . aspirin chewable tablet 81 mg  81 mg Per Tube Daily Flora Lipps, MD   81 mg at 01/26/21 0919  . atorvastatin (LIPITOR) tablet 80 mg  80 mg Per Tube Daily Flora Lipps, MD   80 mg at 01/26/21 0919  . carvedilol (COREG) tablet 12.5 mg  12.5 mg Per Tube BID WC Flora Lipps, MD   12.5 mg at 01/26/21 0919  . chlorhexidine gluconate (MEDLINE KIT) (PERIDEX) 0.12 % solution 15 mL  15 mL Mouth Rinse BID Rust-Chester, Britton L, NP   15 mL at 01/26/21 0920  . Chlorhexidine Gluconate Cloth 2 % PADS 6 each  6 each Topical Q0600 Leonie Man, MD   6 each at 01/26/21 0507  . docusate (COLACE) 50 MG/5ML liquid 100 mg  100 mg Per Tube BID Mauri Brooklyn, MD   100 mg at 01/26/21 0258  . docusate (COLACE) 50 MG/5ML liquid 100 mg  100 mg Oral BID PRN Flora Lipps, MD      . enoxaparin (LOVENOX) injection 55 mg  0.5 mg/kg Subcutaneous Q24H Flora Lipps, MD   55 mg at 01/26/21 1214  . famotidine (PEPCID) 40 MG/5ML suspension 20 mg  20 mg Per Tube Daily Mauri Brooklyn, MD   20 mg at 01/26/21 0919  . feeding supplement (NEPRO CARB STEADY) liquid 1,000 mL  1,000 mL Per Tube Continuous Ottie Glazier, MD 60 mL/hr at 01/25/21 0435 1,000 mL at 01/25/21 0435  . feeding supplement (PROSource TF) liquid 45 mL  45 mL Per Tube BID Ottie Glazier, MD   45 mL at 01/26/21 0920  . fentaNYL (SUBLIMAZE) injection 25-100 mcg  25-100 mcg Intravenous Q2H PRN Flora Lipps, MD   50 mcg at 01/14/21 1047  . free water 250 mL  250 mL Per Tube Q6H Lorella Nimrod, MD   250 mL at 01/26/21 1215  . hydrALAZINE (APRESOLINE) injection 10 mg  10 mg Intravenous Q4H PRN Minna Merritts, MD   10 mg at 01/21/21 5277  . hydrALAZINE (APRESOLINE) tablet 25 mg  25 mg Per Tube Q6H Ottie Glazier, MD   25 mg at 01/26/21 1214  . insulin aspart (novoLOG) injection 0-15 Units  0-15 Units Subcutaneous Q4H Rust-Chester, Britton L, NP   2 Units at 01/26/21 1215  . ipratropium-albuterol (DUONEB) 0.5-2.5 (3) MG/3ML nebulizer solution 3 mL  3 mL Nebulization TID Ottie Glazier, MD   3 mL at 01/26/21 0750  . MEDLINE mouth rinse  15 mL Mouth Rinse 10 times per day Rust-Chester, Britton L, NP   15 mL at 01/26/21 1215  . midazolam (VERSED) injection 2-4 mg  2-4 mg Intravenous Q1H PRN Flora Lipps, MD   2 mg at 01/16/21 0048  . oxyCODONE (Oxy IR/ROXICODONE) immediate release tablet 5 mg  5 mg Per Tube Q6H PRN Flora Lipps, MD   5 mg at 01/25/21 1341  . polyethylene glycol (MIRALAX / GLYCOLAX) packet 17 g  17 g Per Tube Daily Mauri Brooklyn, MD   17 g at 01/26/21 0920  . polyethylene glycol (MIRALAX / GLYCOLAX) packet 17 g  17 g Per Tube Daily PRN Flora Lipps, MD      . polyvinyl alcohol (LIQUIFILM TEARS) 1.4 % ophthalmic solution 2 drop  2 drop Both Eyes PRN Lorella Nimrod, MD   2 drop at 01/25/21 2210  . sodium chloride flush (NS) 0.9 % injection 10-40 mL  10-40 mL Intracatheter PRN Leonie Man, MD   10 mL at 01/02/21 1008  . sodium chloride flush (NS) 0.9 % injection 10-40 mL  10-40 mL Intracatheter PRN Tyler Pita, MD         Discharge Medications: Please see discharge summary for a list of discharge medications.  Relevant Imaging Results:  Relevant Lab Results:   Additional Information [id number]  Eileen Stanford, LCSW

## 2021-01-26 NOTE — Progress Notes (Signed)
  Speech Language Pathology Treatment: Cognitive-Linquistic;Passy Muir Speaking valve  Patient Details Name: Jeremy Browning MRN: [id number] DOB: [birth year] Today's Date: 01/26/2021 Time: 1420-1500 SLP Time Calculation (min) (ACUTE ONLY): 40 min  Assessment / Plan / Recommendation Clinical Impression  Pt demonstrates behaviors consistent with [place]o Level II emerging level III. During today's session, PMSV was placed with no decline in vitals. Jeremy Browning continues to be 8 cuffed Shiley, PMSV donned and tolerated for ~ 20 minutes. PMSV placed on leash attached to trach collar as PMSV came off of trach hub when pt coughed. Phonation could be heard x 1 with PMSV donned but not volitional phonation. Pt didn't produce a localized response to bright light, his name being called, cold washcloth to face, toothbrush to his lips/mouth and deep sternal rub. OT was present during the session and pt did demonstrate a localized response to pain. Orders placed for PMSV wear during all therapies and will nursing. Sign posted in pt's room.    HPI HPI: Pt is a 60 yo male that presented from home with respiratory distress 12/25/2020. Underwent defibrillation and CPR, emergently intubated. During admission pt has had a cardiac cath, peg tube and tracheostomy placed, weaned from vent 01/13/2021. 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 Mildly restricted diffusion involving the cortex more diffusely over the right cerebral convexity favored to reflect an acute MCA territory infarct over global hypoxic injury given unilaterality".  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 No further acute events and pt continues to tolerate Trach collar @ 28% FiO2. #8 Shiley trach, cuffed.      SLP Plan  Continue with current plan of care        Recommendations         Patient may use Passy-Muir Speech Valve: Intermittently with supervision;During all therapies with supervision PMSV Supervision: Full MD: Please consider changing trach tube to : Smaller size         Oral Care Recommendations: Oral care QID Follow up Recommendations: Inpatient Rehab SLP Visit Diagnosis: Cognitive communication deficit (R41.841) (d/t anoxic brain injury complicated by multiple CVAs) Plan: Continue with current plan of care       GO               Kendan Cornforth B. Dreama Saa M.S., CCC-SLP, [facility] Speech-Language Pathologist Rehabilitation Services Office [phone]  Jeremy Browning 01/26/2021, 4:48 PM

## 2021-01-26 NOTE — Progress Notes (Addendum)
PROGRESS NOTE  Jeremy Browning IWP:[id number] DOB: [birth year] DOA: 12/25/2020 PCP: No primary care provider on file.   LOS: 32 days   Brief Narrative / Interim history: 60 year old male with obesity, DM2, HTN, combined CHF who was admitted on 12/25/2020 with respiratory distress.  He was intubated in the ER also had VT requiring epi/cardioversion.  Hospital course complicated by persistent vent dependent respiratory failure eventually requiring tracheostomy, anoxic encephalopathy.  Significant events 5/7 severe resp failure 5/36mltiorgan failureMRI shows brain injury 5/173fled weaning trials due to severe brain damage  5/17no purposeful movement on wake up assessment, becomes tachypneic, asynchronous with vent 5/18remains critically ill, severe brain damage, patient made DNR 5/2046fly decided on TRACH/PEG tube 5/2[facility] 5/26 s/p trach PEG tube 01/20/21 tolerates Trach collar @ 28% FiO2 01/25/21: declined for LTAC.  Patient is becoming little more responsive  Subjective / 24h Interval events: His eyes are open, intermittently tracking me, also intermittently follows commands, he would wiggle his toes but not move his arms  Assessment & Plan: Principal Problem Acute hypoxic respiratory failure in the setting of cardiac arrest, pulmonary edema, Proteus pneumonia -treated while in the ICU, completed a course of antibiotics with ceftriaxone and Unasyn.  Has not required vent for the past 4 days currently tolerating trach collar -Continue aggressive pulmonary toilet, continue bronchodilators  Active Problems Cardiac arrest / V. Tach / non-STEMI / acute combined systolic and diastolic CHF /cardiogenic shock-requiring pressors while in the ICU, currently resolved.  Cardiology consulted and underwent a cardiac cath on 5/7 which showed normal coronaries.  A 2D echo done on 5/9 showed an EF of 45-50% with global hypokinesis of the left ventricle.  It also showed grade 1 diastolic  dysfunction.  Anoxic encephalopathy-high suspicion based on the MRI, clinically stable currently.  Neurology consulted and evaluated patient while hospitalized  Acute kidney injury-resolved, baseline creatinine seems to be around 1.1-1.2  Anemia of chronic disease-hemoglobin stable, no bleeding  Scheduled Meds: . aspirin  81 mg Per Tube Daily  . atorvastatin  80 mg Per Tube Daily  . carvedilol  12.5 mg Per Tube BID WC  . chlorhexidine gluconate (MEDLINE KIT)  15 mL Mouth Rinse BID  . Chlorhexidine Gluconate Cloth  6 each Topical Q0600  . docusate  100 mg Per Tube BID  . enoxaparin (LOVENOX) injection  0.5 mg/kg Subcutaneous Q24H  . famotidine  20 mg Per Tube Daily  . feeding supplement (PROSource TF)  45 mL Per Tube BID  . free water  250 mL Per Tube Q6H  . hydrALAZINE  25 mg Per Tube Q6H  . insulin aspart  0-15 Units Subcutaneous Q4H  . ipratropium-albuterol  3 mL Nebulization TID  . mouth rinse  15 mL Mouth Rinse 10 times per day  . polyethylene glycol  17 g Per Tube Daily   Continuous Infusions: . feeding supplement (NEPRO CARB STEADY) 1,000 mL (01/25/21 0435)   PRN Meds:.acetaminophen (TYLENOL) oral liquid 160 mg/5 mL, albuterol, docusate, fentaNYL (SUBLIMAZE) injection, hydrALAZINE, midazolam, oxyCODONE, polyethylene glycol, polyvinyl alcohol, sodium chloride flush, sodium chloride flush  Diet Orders (From admission, onward)    Start     Ordered   01/10/21 1632  Diet NPO time specified  Diet effective now        01/10/21 1631          DVT prophylaxis:      Code Status: DNR  Family Communication: No family at bedside  Status is: Inpatient  Remains inpatient appropriate because:Inpatient level of  care appropriate due to severity of illness   Dispo: The patient is from: Home              Anticipated d/c is to: SNF              Patient currently is not medically stable to d/c.   Difficult to place patient Yes   Level of care: Progressive  Cardiac  Consultants:  Critical care GI Cardiology ENT Palliative care Neurology  Procedures:  2D echo Cardiac cath Trach PEG  Microbiology  none  Antimicrobials: none    Objective: Vitals:   01/26/21 0435 01/26/21 0745 01/26/21 0750 01/26/21 1119  BP: (!) 154/57 (!) 119/44  (!) 138/59  Pulse: 75 67  69  Resp: _0 Temp: 100.2 F (37.9 C) 99.8 F (37.7 C)  99.6 F (37.6 C)  TempSrc:  Axillary  Axillary  SpO2: 100% 100% 100% 100%  Weight:      Height:        Intake/Output Summary (Last 24 hours) at 01/26/2021 1228 Last data filed at 01/26/2021 1120 Gross per 24 hour  Intake 1063 ml  Output 2150 ml  Net -1087 ml   Filed Weights   01/24/21 0454 01/25/21 0436 01/25/21 1005  Weight: 120.2 kg 119.9 kg 117 kg    Examination:  Constitutional: NAD, alert Eyes: no scleral icterus ENMT: Mucous membranes are moist.  Neck: normal, supple Respiratory: Coarse breath sounds, no wheezing, no crackles. Normal respiratory effort. Cardiovascular: Regular rate and rhythm, no murmurs / rubs / gallops. No LE edema.   Abdomen: non distended, no tenderness. Bowel sounds positive.  Musculoskeletal: no clubbing / cyanosis.  Skin: no rashes Neurologic: Intermittently follows commands but not consistent.  Data Reviewed: I have independently reviewed following labs and imaging studies   CBC: Recent Labs  Lab 01/22/21 0505 01/24/21 0455 01/26/21 0448  WBC 9.9 10.1 10.5  HGB 9.9* 9.5* 9.2*  HCT 32.1* 30.9* 28.7*  MCV 79.5* 78.2* 78.4*  PLT 231 237 387   Basic Metabolic Panel: Recent Labs  Lab 01/20/21 0404 01/21/21 0537 01/22/21 0505 01/23/21 0439 01/26/21 0448  NA 145 142 141 138 136  K 4.2 4.2 4.1 3.9 4.0  CL 113* 110 110 109 108  CO2 _1 GLUCOSE 147* 143* 178* 140* 152*  BUN 42* 40* 44* 43* 43*  CREATININE 1.39* 1.21 1.31* 1.24 1.25*  CALCIUM 8.9 8.8* 8.4* 8.2* 8.3*  MG 2.3 2.1  --   --   --   PHOS 4.8* 4.1  --   --   --    Liver  Function Tests: No results for input(s): AST, ALT, ALKPHOS, BILITOT, PROT, ALBUMIN in the last 168 hours. Coagulation Profile: No results for input(s): INR, PROTIME in the last 168 hours. HbA1C: No results for input(s): HGBA1C in the last 72 hours. CBG: Recent Labs  Lab 01/25/21 2104 01/26/21 0031 01/26/21 0436 01/26/21 0748 01/26/21 1117  GLUCAP 145* 144* 146* 143* 136*    No results found for this or any previous visit (from the past 240 hour(s)).   Radiology Studies: No results found.   Marzetta Board, MD, PhD [facility]  Between 7 am - 7 pm I am available, please contact me via Amion (for emergencies) or Securechat (non urgent messages)  Between 7 pm - 7 am I am not available, please contact night coverage MD/APP via Amion

## 2021-01-26 NOTE — TOC Progression Note (Addendum)
Transition of Care [facility]) - Progression Note    Patient Details  Name: Jeremy Browning MRN: [id number] Date of Birth: 1961-08-12  Transition of [facility]) CM/SW Contact  Maree Krabbe, LCSW Phone Number: 01/26/2021, 3:01 PM  Clinical Narrative:  CSW confirmed with [facility] that pt's Medicaid would not cover long term care. Pt does not qualify for LTACH and would not be able to go to CIR. Pt is on difficult to place list.    Expected Discharge Plan: Skilled Nursing Facility Barriers to Discharge: Continued Medical Work up  Expected Discharge Plan and Services Expected Discharge Plan: Skilled Nursing Facility In-house Referral: Clinical Social Work   Post Acute Care Choice: Skilled Nursing Facility                                         Social Determinants of Health (SDOH) Interventions    Readmission Risk Interventions No flowsheet data found.

## 2021-01-26 NOTE — Progress Notes (Signed)
Occupational Therapy Treatment Patient Details Name: Jeremy Browning MRN: [id number] DOB: 1961-01-16 Today's Date: 01/26/2021    History of present illness Pt is a 60 yo male that presented from home with respiratory distress 12/25/2020. Underwent defibrillation and CPR, emergently intubated. During admission pt has had a cardiac cath, peg tube and tracheostomy placed, weaned from vent 5/26 AM. 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 Mildly restricted diffusion involving the cortex more diffusely over the right cerebral convexity favored to reflect an acute MCA territory infarct over global hypoxic injury given unilaterality".   OT comments  Mr Anselmi was seen for OT treatment on this date. Pt required MAX A hand over hand tooth brushing, TOTAL A to wash face/head at bed level. TOTAL A don/doff B resting hand splints at bed level - noted grossly 2-/5 RUE proximal strength, movement in response to pain with end ranges of motion, 1/5 LUE grossly. Pt tolerated PROM exercises as described below, pt with increased lethargy this date and responds to less than 50% of questions while at bed level. Pt continues to benefit from skilled OT services to maximize return to PLOF and minimize risk of future falls, injury, caregiver burden, and readmission. Will continue to follow POC. Discharge recommendation remains appropriate.    Follow Up Recommendations  CIR    Equipment Recommendations  Hospital bed    Recommendations for Other Services      Precautions / Restrictions Precautions Precautions: Fall Precaution Comments: trach, PEG Restrictions Weight Bearing Restrictions: No       Mobility Bed Mobility  Pt requires +2 for bed mobility and demonstrated increased lethargy this date, deferred to next date                                    ADL either performed or assessed with clinical judgement   ADL Overall ADL's : Needs  assistance/impaired                                       General ADL Comments: Pt required MAX A hand over hand tooth brushing, TOTAL A to wash face/head at bed level. TOTAL A don/doff B resting hand splints at bed level - noted grossly 2-/5 RUE proximal strength, movement in response to pain with end ranges of motion, 1/5 LUE grossly.               Cognition Arousal/Alertness: Awake/alert Behavior During Therapy: Flat affect Overall Cognitive Status: Impaired/Different from baseline Area of Impairment: Rancho level               Rancho Levels of Cognitive Functioning Rancho Los Amigos Scales of Cognitive Functioning: Localized response                        Exercises Exercises: General Upper Extremity;Other exercises General Exercises - Upper Extremity Shoulder Flexion: PROM;Both;Supine;5 reps Shoulder Extension: PROM;Both;Supine;5 reps Shoulder ABduction: PROM;Both;Supine;5 reps Shoulder ADduction: PROM;Both;Supine;5 reps Shoulder Horizontal ABduction: PROM;Both;Supine;5 reps Shoulder Horizontal ADduction: PROM;Both;Supine;5 reps Elbow Flexion: PROM;Both;Supine;5 reps Elbow Extension: PROM;Both;Supine;5 reps Wrist Flexion: PROM;Both;Supine;5 reps Wrist Extension: PROM;Both;Supine;5 reps Digit Composite Flexion: PROM;Both;Supine;5 reps Composite Extension: PROM;Both;Supine;5 reps Other Exercises Other Exercises: PROM of BUE. Don/doff B resting hand splints. sup<>sit, sitting balance/tolerance Other Exercises: Grooming, don/doff B resting hand splints  Pertinent Vitals/ Pain       Pain Assessment: Faces Faces Pain Scale: Hurts little more Pain Location: grimicing with PROM of extremities Pain Descriptors / Indicators: Grimacing;Guarding Pain Intervention(s): Limited activity within patient's tolerance;Repositioned         Frequency  Min 2X/week        Progress Toward Goals  OT Goals(current goals can now be found  in the care plan section)     Acute Rehab OT Goals Patient Stated Goal: to improve pain OT Goal Formulation: With patient Time For Goal Achievement: 02/08/21 Potential to Achieve Goals: Fair  Plan         AM-PAC OT "6 Clicks" Daily Activity     Outcome Measure   Help from another person eating meals?: Total Help from another person taking care of personal grooming?: A Lot Help from another person toileting, which includes using toliet, bedpan, or urinal?: Total Help from another person bathing (including washing, rinsing, drying)?: Total Help from another person to put on and taking off regular upper body clothing?: Total Help from another person to put on and taking off regular lower body clothing?: Total 6 Click Score: 7    End of Session Equipment Utilized During Treatment: Oxygen  OT Visit Diagnosis: Other symptoms and signs involving the nervous system (R29.898);Other symptoms and signs involving cognitive function   Activity Tolerance Patient limited by lethargy;Patient tolerated treatment well   Patient Left in bed;with bed alarm set   Nurse Communication          Time: 1350-1416 OT Time Calculation (min): 26 min  Charges: OT General Charges $OT Visit: 1 Visit OT Treatments $Self Care/Home Management : 8-22 mins $Therapeutic Exercise: 8-22 mins  Kathie Dike, M.S. OTR/L  01/26/21, 3:10 PM  ascom [phone]

## 2021-01-26 NOTE — Progress Notes (Signed)
Nutrition Follow Up Note   DOCUMENTATION CODES:   Obesity unspecified  INTERVENTION:   Continue Nepro 1.8 '@60ml' /hr + Pro-Source 61m BID via tube  Decrease free water flushes to 2087mq4 hours   Regimen provides 2672kcal/day, 139g/day protein and 224786may free water   NUTRITION DIAGNOSIS:   Inadequate oral intake related to inability to eat (pt sedated and ventilated) as evidenced by NPO status.  GOAL:   Patient will meet greater than or equal to 90% of their needs  -met with tube feeds   MONITOR:   Labs,Weight trends,TF tolerance,Skin,I & O's  ASSESSMENT:   [age]o. black male with diabetes mellitus type II, hypertension and congestive heart failure [facility] for cardiogenic shock and flash pulmonary edema  Pt s/p PEG tube 5/24  Pt s/p tracheostomy 5/25  Pt with trach collar; pt now out of the ICU. Pt tolerating tube feeds well at goal rate. Refeed labs stable. Sodium trending down, will decrease free water flushes slightly. Per chart, pt has remained weight stable since admit. No wounds present. Pt evaluated by SLP and placed with passy muir valve on 6/7. Pt currently pending placement.   Medications reviewed and include: aspirin, lovenox, pepcid, lasix, insulin, miralax  Labs reviewed: BUN 43(H), creat 1.25(H) P 4.1 wnl, Mg 2.1 wnl- 6/3 Hgb 9.2(L), Hct 28.7(L), MCV 78.4(L), MCH 25.1(L) cbgs- 144, 146, 143, 136 x 24 hrs  Diet Order:   Diet Order            Diet NPO time specified  Diet effective now                EDUCATION NEEDS:   No education needs have been identified at this time  Skin:  Skin Assessment: Reviewed RN Assessment: incision neck s/p trach   Last BM:  6/7- type 6  Height:   Ht Readings from Last 1 Encounters:  01/25/21 6' (1.829 m)    Weight:   Wt Readings from Last 1 Encounters:  01/25/21 117 kg    BMI:  Body mass index is 34.98 kg/m.  Estimated Nutritional Needs:   Kcal:   2500-2800kcal/day  Protein:  125-140g/day  Fluid:  2.4-2.7L/day  CasKoleen Distance, RD, LDN Please refer to [facility] RD and/or RD on-call/weekend/after hours pager

## 2021-01-26 NOTE — Plan of Care (Signed)
  Problem: Clinical Measurements: Goal: Ability to maintain clinical measurements within normal limits will improve Outcome: Progressing   Problem: Clinical Measurements: Goal: Will remain free from infection Outcome: Progressing   Problem: Clinical Measurements: Goal: Respiratory complications will improve Outcome: Progressing   Problem: Nutrition: Goal: Adequate nutrition will be maintained Outcome: Progressing

## 2021-01-27 LAB — GLUCOSE, CAPILLARY
Glucose-Capillary: 147 mg/dL — ABNORMAL HIGH (ref 70–99)
Glucose-Capillary: 152 mg/dL — ABNORMAL HIGH (ref 70–99)
Glucose-Capillary: 154 mg/dL — ABNORMAL HIGH (ref 70–99)
Glucose-Capillary: 157 mg/dL — ABNORMAL HIGH (ref 70–99)
Glucose-Capillary: 160 mg/dL — ABNORMAL HIGH (ref 70–99)
Glucose-Capillary: 165 mg/dL — ABNORMAL HIGH (ref 70–99)
Glucose-Capillary: 179 mg/dL — ABNORMAL HIGH (ref 70–99)

## 2021-01-27 NOTE — Progress Notes (Signed)
Physical Therapy Treatment Patient Details Name: Jeremy Browning MRN: [id number] DOB: [birth year] Today's Date: 01/27/2021    History of Present Illness Pt is a 60 yo male that presented from home with respiratory distress 12/25/2020. Underwent defibrillation and CPR, emergently intubated. During admission pt has had a cardiac cath, peg tube and tracheostomy placed, weaned from vent 5/26 AM. 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 Mildly restricted diffusion involving the cortex more diffusely over the right cerebral convexity favored to reflect an acute MCA territory infarct over global hypoxic injury given unilaterality".    PT Comments    Patient with generalized response to stimuli with only intermittent purposeful movement. Patient lethargic during session and does not seem to increase arousal with placing bed in the chair position. Patient opens eyes multiple times with verbal stimuli with sustained eye opening of less than one minute each time. Grimacing with PROM of extremities. Patient was able to follow 2 commands this session with looking (tracking to the left) to find his sister in the room, and he does nod his head yes when asked.Otherwise, unable to follow commands. 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    Follow Up Recommendations  CIR (may require SNF depending on progres, ongoing assessment)     Equipment Recommendations  None recommended by PT    Recommendations for Other Services       Precautions / Restrictions Precautions Precautions: Fall Restrictions Weight Bearing Restrictions: No    Mobility  Bed Mobility Overal bed mobility: Needs Assistance             General bed mobility comments: patient tolerated sitting in chair position in bed without signs of pain or significant change in vitals with activity. minimal increase in arousal in more upright position    Transfers                     Ambulation/Gait                 Stairs             Wheelchair Mobility    Modified Rankin (Stroke Patients Only)       Balance                                            Cognition Arousal/Alertness: Lethargic Behavior During Therapy: Flat affect Overall Cognitive Status: Impaired/Different from baseline Area of Impairment: Rancho level               Rancho Levels of Cognitive Functioning Rancho Los Amigos Scales of Cognitive Functioning: Generalized response               General Comments: patient followed two single step commands this session      Exercises General Exercises - Lower Extremity Ankle Circles/Pumps: PROM;Strengthening;Both;10 reps (from chair position of bed) Long Arc Quad: PROM;Strengthening;Both;10 reps (from chair position of bed) Heel Slides: PROM;Strengthening;Both;10 reps (from chair position of bed) Other Exercises Other Exercises: verbal cues for participation and active movement. patient grimices at times with ROM of extremities. spontaneous movement noted in right leg but not to command. multiple beats of clonus noted with LLE with dorsiflexion    General Comments        Pertinent Vitals/Pain Pain Assessment: Faces Faces Pain Scale: Hurts little more Pain Location: grimicing with  ROM of extremities Pain Descriptors / Indicators: Grimacing;Guarding Pain Intervention(s): Limited activity within patient's tolerance    Home Living                      Prior Function            PT Goals (current goals can now be found in the care plan section) Acute Rehab PT Goals Patient Stated Goal: none stated PT Goal Formulation: Patient unable to participate in goal setting Time For Goal Achievement: 02/10/21 Progress towards PT goals: Progressing toward goals    Frequency    Min 2X/week      PT Plan Current plan remains appropriate (time for goal achievement updated)     Co-evaluation   Reason for Co-Treatment: Complexity of the patient's impairments (multi-system involvement);To address functional/ADL transfers   OT goals addressed during session: ADL's and self-care SLP goals addressed during session: Cognition    AM-PAC PT "6 Clicks" Mobility   Outcome Measure  Help needed turning from your back to your side while in a flat bed without using bedrails?: Total Help needed moving from lying on your back to sitting on the side of a flat bed without using bedrails?: Total Help needed moving to and from a bed to a chair (including a wheelchair)?: Total Help needed standing up from a chair using your arms (e.g., wheelchair or bedside chair)?: Total Help needed to walk in hospital room?: Total Help needed climbing 3-5 steps with a railing? : Total 6 Click Score: 6    End of Session   Activity Tolerance: Patient limited by lethargy Patient left: in bed;with call bell/phone within reach;with bed alarm set;with family/visitor present Nurse Communication: Mobility status PT Visit Diagnosis: Other abnormalities of gait and mobility (R26.89);Muscle weakness (generalized) (M62.81);Other symptoms and signs involving the nervous system (R29.898)     Time: 8676-1950 PT Time Calculation (min) (ACUTE ONLY): 21 min  Charges:  $Therapeutic Activity: 8-22 mins                    Donna Bernard, PT, MPT    [name] 01/27/2021, 1:03 PM

## 2021-01-27 NOTE — Progress Notes (Addendum)
Occupational Therapy Treatment Patient Details Name: Jeremy Browning MRN: [id number] DOB: 1960-11-02 Today's Date: 01/27/2021    History of present illness Pt is a 60 yo male that presented from home with respiratory distress 12/25/2020. Underwent defibrillation and CPR, emergently intubated. During admission pt has had a cardiac cath, peg tube and tracheostomy placed, weaned from vent 5/26 AM. 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 Mildly restricted diffusion involving the cortex more diffusely over the right cerebral convexity favored to reflect an acute MCA territory infarct over global hypoxic injury given unilaterality".   OT comments  Jeremy Browning was seen for OT/SLP co-treatment on this date. Pt with generalized response to pain in all extremities, greater in R than L. Pt tolerated sitting EOB ~25 min, intermittently CGA to maintian static sitting balance when trunk positioned over BOS and LUE propped on pillows - protective righting reflexes noted with posterior and anterior LOB. Pt continues to demonstrate emerging RLAS III behaviours during mobility.   Pt requires TOTAL A don/doff B splints and prevalon boots at bed level. TOTAL A x2 for toileting at bed level. Pt continues to benefit from skilled OT services to maximize return to PLOF and minimize risk of future falls, injury, caregiver burden, and readmission. Will continue to follow POC. Discharge recommendation remains appropriate.    Follow Up Recommendations  CIR;Supervision/Assistance - 24 hour    Equipment Recommendations  Hospital bed    Recommendations for Other Services      Precautions / Restrictions Precautions Precautions: Fall Restrictions Weight Bearing Restrictions: No       Mobility Bed Mobility Overal bed mobility: Needs Assistance Bed Mobility: Rolling;Supine to Sit;Sit to Supine Rolling: Total assist;+2 for physical assistance   Supine to sit: Total  assist;+2 for physical assistance Sit to supine: Total assist;+2 for physical assistance   General bed mobility comments: pt reaching UE during trunk elevation/reclining to bed                Balance Overall balance assessment: Needs assistance Sitting-balance support: Feet unsupported;Bilateral upper extremity supported Sitting balance-Leahy Scale: Poor Sitting balance - Comments: Pt sat EOB for ~25 minutes. Intermittently able to maintain seated balance with CGA, fatigued and needing modA due to posterior lean. Protective righting reactions noted.                                   ADL either performed or assessed with clinical judgement   ADL Overall ADL's : Needs assistance/impaired                                       General ADL Comments: TOTAL A don/doff B splints and prevalon boots at bed level. TOTAL A x2 for toileting at bed level      Cognition Arousal/Alertness: Lethargic Behavior During Therapy: Flat affect Overall Cognitive Status: Impaired/Different from baseline Area of Impairment: Rancho level               Rancho Levels of Cognitive Functioning Rancho Los Amigos Scales of Cognitive Functioning: Localized response               General Comments: increased phonations        Exercises Exercises: Other exercises General Exercises - Lower Extremity Ankle Circles/Pumps: PROM;Strengthening;Both;10 reps (from chair position of bed) Long Arc  Quad: PROM;Strengthening;Both;10 reps (from chair position of bed) Heel Slides: PROM;Strengthening;Both;10 reps (from chair position of bed) Other Exercises Other Exercises: PROM of BUE. Don/doff B resting hand splints/prevalon boots. Toileting, rolling, don/doff gown, sup<>sit, sitting balance/tolerance           Pertinent Vitals/ Pain       Pain Assessment: Faces Faces Pain Scale: Hurts little more Pain Location: grimicing with ROM of extremities Pain Descriptors /  Indicators: Grimacing;Guarding Pain Intervention(s): Limited activity within patient's tolerance;Repositioned         Frequency  Min 2X/week        Progress Toward Goals  OT Goals(current goals can now be found in the care plan section)  Progress towards OT goals: Progressing toward goals  Acute Rehab OT Goals Patient Stated Goal: none stated OT Goal Formulation: With patient Time For Goal Achievement: 02/08/21 Potential to Achieve Goals: Fair ADL Goals Pt Will Perform Grooming: with mod assist;sitting Additional ADL Goal #1: Pt's care team will demo competency in b/l resting hand splint wear schedule and donning/doffing with no cues Additional ADL Goal #2: Pt will complete 1-2 step ADL task with MOD A Additional ADL Goal #3: Pt will experience at least 30% reduction of b/l UE edema through manual techniques as evidenced by no pitting in hands.  Plan Discharge plan remains appropriate;Frequency remains appropriate    Co-evaluation    PT/OT/SLP Co-Evaluation/Treatment: Yes Reason for Co-Treatment: Complexity of the patient's impairments (multi-system involvement)   OT goals addressed during session: ADL's and self-care SLP goals addressed during session: Cognition    AM-PAC OT "6 Clicks" Daily Activity     Outcome Measure   Help from another person eating meals?: Total Help from another person taking care of personal grooming?: A Lot Help from another person toileting, which includes using toliet, bedpan, or urinal?: Total Help from another person bathing (including washing, rinsing, drying)?: Total Help from another person to put on and taking off regular upper body clothing?: Total Help from another person to put on and taking off regular lower body clothing?: Total 6 Click Score: 7    End of Session Equipment Utilized During Treatment: Oxygen  OT Visit Diagnosis: Other symptoms and signs involving the nervous system (R29.898);Other symptoms and signs involving  cognitive function   Activity Tolerance Patient limited by lethargy;Patient tolerated treatment well   Patient Left in bed;with nursing/sitter in room   Nurse Communication          Time: 4196-2229 OT Time Calculation (min): 64 min  Charges: OT General Charges $OT Visit: 1 Visit OT Treatments $Self Care/Home Management : 23-37 mins   Kathie Dike, M.S. OTR/L  01/27/21, 2:10 PM  ascom [phone]

## 2021-01-27 NOTE — Progress Notes (Addendum)
PROGRESS NOTE  Jeremy Browning FHQ:[id number] DOB: 1960-12-29 DOA: 12/25/2020 PCP: No primary care provider on file.   LOS: 33 days   Brief Narrative / Interim history: 60 year old male with obesity, DM2, HTN, combined CHF who was admitted on 12/25/2020 with respiratory distress.  He was intubated in the ER also had VT requiring epi/cardioversion.  Hospital course complicated by persistent vent dependent respiratory failure eventually requiring tracheostomy, anoxic encephalopathy.  Significant events 5/7 severe resp failure 5/9 multiorgan failure MRI shows brain injury 5/12 failed weaning trials due to severe brain damage  5/17 no purposeful movement on wake up assessment, becomes tachypneic, asynchronous with vent 5/18 remains critically ill, severe brain damage, patient made DNR 5/20 family decided on TRACH/PEG tube 5/25 [facility] PLACED by ENT 5/26 s/p trach PEG tube 01/20/21 tolerates Trach collar @ 28% FiO2 01/25/21: declined for LTAC.  Patient is becoming little more responsive  Subjective / 24h Interval events: Keeps his eyes open this morning, nonverbal.  Appears comfortable  Assessment & Plan: Principal Problem Acute hypoxic respiratory failure in the setting of cardiac arrest, pulmonary edema, Proteus pneumonia -treated while in the ICU, completed a course of antibiotics with ceftriaxone and Unasyn.  Has not required vent for the past 4 days currently tolerating trach collar -Continue aggressive pulmonary toilet, continue bronchodilators -Respite status stable, on trach collar.  ENT plans to downsize trach  Active Problems Cardiac arrest / V. Tach / non-STEMI / acute combined systolic and diastolic CHF /cardiogenic shock-requiring pressors while in the ICU, currently resolved.  Cardiology consulted and underwent a cardiac cath on 5/7 which showed normal coronaries.  A 2D echo done on 5/9 showed an EF of 45-50% with global hypokinesis of the left ventricle.  It also showed grade 1  diastolic dysfunction.  Currently appears euvolemic  Anoxic brain encephalopathy, CVAs-high suspicion based on the MRI, clinically stable currently.  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  Neurology consulted and evaluated patient while hospitalized.  He does appear to have left-sided weakness  Acute kidney injury-resolved, baseline creatinine seems to be around 1.1-1.2  Anemia of chronic disease-hemoglobin stable, no bleeding  Scheduled Meds:  aspirin  81 mg Per Tube Daily   atorvastatin  80 mg Per Tube Daily   carvedilol  12.5 mg Per Tube BID WC   chlorhexidine gluconate (MEDLINE KIT)  15 mL Mouth Rinse BID   Chlorhexidine Gluconate Cloth  6 each Topical Q0600   docusate  100 mg Per Tube BID   enoxaparin (LOVENOX) injection  0.5 mg/kg Subcutaneous Q24H   famotidine  20 mg Per Tube Daily   feeding supplement (PROSource TF)  45 mL Per Tube BID   free water  200 mL Per Tube Q6H   hydrALAZINE  25 mg Per Tube Q6H   insulin aspart  0-15 Units Subcutaneous Q4H   ipratropium-albuterol  3 mL Nebulization TID   mouth rinse  15 mL Mouth Rinse 10 times per day   polyethylene glycol  17 g Per Tube Daily   Continuous Infusions:  feeding supplement (NEPRO CARB STEADY) 1,000 mL (01/25/21 0435)   PRN Meds:.acetaminophen (TYLENOL) oral liquid 160 mg/5 mL, albuterol, docusate, fentaNYL (SUBLIMAZE) injection, hydrALAZINE, midazolam, oxyCODONE, polyethylene glycol, polyvinyl alcohol, sodium chloride flush, sodium chloride flush  Diet Orders (From admission, onward)     Start     Ordered   01/10/21 1632  Diet NPO time specified  Diet  effective now        01/10/21 1631            DVT prophylaxis:      Code Status: DNR  Family Communication: No family at bedside  Status is: Inpatient  Remains inpatient appropriate because:Inpatient level of care appropriate due to  severity of illness   Dispo: The patient is from: Home              Anticipated d/c is to: SNF              Patient currently is not medically stable to d/c.   Difficult to place patient Yes   Level of care: Progressive Cardiac  Consultants:  Critical care GI Cardiology ENT Palliative care Neurology  Procedures:  2D echo Cardiac cath Trach PEG  Microbiology  none  Antimicrobials: none    Objective: Vitals:   01/27/21 0500 01/27/21 0625 01/27/21 0712 01/27/21 0751  BP:  (!) 166/64 (!) 158/58   Pulse:   84 86  Resp:   20 18  Temp:   99.8 F (37.7 C)   TempSrc:   Axillary   SpO2: 100%  100% 100%  Weight:      Height:        Intake/Output Summary (Last 24 hours) at 01/27/2021 0930 Last data filed at 01/27/2021 0844 Gross per 24 hour  Intake 300 ml  Output 2800 ml  Net -2500 ml    Filed Weights   01/24/21 0454 01/25/21 0436 01/25/21 1005  Weight: 120.2 kg 119.9 kg 117 kg    Examination:  Constitutional: nad Eyes: no icterus  ENMT: mmm Neck: normal, supple Respiratory: Coarse breath sounds, no wheezing, no crackles Cardiovascular: Regular rate and rhythm, no edema Abdomen: Soft, NT, ND, bowel sounds positive Musculoskeletal: no clubbing / cyanosis.  Skin: n no new rashes Neurologic: Follows commands but only moves the right side of his body, left side is flaccid (not new, noted before)  Data Reviewed: I have independently reviewed following labs and imaging studies   CBC: Recent Labs  Lab 01/22/21 0505 01/24/21 0455 01/26/21 0448  WBC 9.9 10.1 10.5  HGB 9.9* 9.5* 9.2*  HCT 32.1* 30.9* 28.7*  MCV 79.5* 78.2* 78.4*  PLT 231 237 748    Basic Metabolic Panel: Recent Labs  Lab 01/21/21 0537 01/22/21 0505 01/23/21 0439 01/26/21 0448  NA 142 141 138 136  K 4.2 4.1 3.9 4.0  CL 110 110 109 108  CO2 $Re'24 24 23 23  'rAa$ GLUCOSE 143* 178* 140* 152*  BUN 40* 44* 43* 43*  CREATININE 1.21 1.31* 1.24 1.25*  CALCIUM 8.8* 8.4* 8.2* 8.3*  MG 2.1  --    --   --   PHOS 4.1  --   --   --     Liver Function Tests: No results for input(s): AST, ALT, ALKPHOS, BILITOT, PROT, ALBUMIN in the last 168 hours. Coagulation Profile: No results for input(s): INR, PROTIME in the last 168 hours. HbA1C: No results for input(s): HGBA1C in the last 72 hours. CBG: Recent Labs  Lab 01/26/21 1616 01/26/21 2111 01/27/21 0052 01/27/21 0510 01/27/21 0711  GLUCAP 144* 125* 147* 152* 165*     No results found for this or any previous visit (from the past 240 hour(s)).   Radiology Studies: No results found.   Marzetta Board, MD, PhD [facility]  Between 7 am - 7 pm I am available, please contact me via Amion (for emergencies) or Securechat (non urgent messages)  Between 7 pm - 7 am I am not available, please contact night coverage MD/APP via Amion

## 2021-01-27 NOTE — Progress Notes (Addendum)
Pt trach downsized to a size 6 Flex uncuffed trach per MD order. Pt tolerated procedure well. RN, ST and Family at bedside.

## 2021-01-27 NOTE — Progress Notes (Signed)
..  01/27/2021 11:53 AM  [name] [phone]   Discussed patient with speech pathology.  They feel patient would potentially benefit from downsizing to size 6 uncuffed tracheostomy tube.  OK per ENT perspective to downsize by respiratory therapy.  Please re-consult if any concerns.  Adama Ivins 01/27/2021, 11:53 AM

## 2021-01-27 NOTE — Progress Notes (Signed)
  Speech Language Pathology Treatment: Cognitive-Linquistic  Patient Details Name: Jeremy Browning MRN: [id number] DOB: [birth year] Today's Date: 01/27/2021 Time: 0900-1000 SLP Time Calculation (min) (ACUTE ONLY): 60 min  Assessment / Plan / Recommendation Clinical Impression  Pt was seen for OT/SLP co-treatment on this date to target cognition within functional tasks. Pt presents with Rancho Level II, emerging level III behaviors. During today's session, pt demonstrated increase localized response to pain by guarding, withdrawing as vocalizing. Total assistance was provided for item descrimination with wash cloth. Total Assist faded to maximal for possible motor response with foot movement. Active stimulation with object use and tactile assistance provided to help pt move his head to track therapist. Pt unable to perform any commands during bed mobility. He continues to require total assistance for all activities.    HPI HPI: Pt is a 60 yo male that presented from home with respiratory distress 12/25/2020. Underwent defibrillation and CPR, emergently intubated. During admission pt has had a cardiac cath, peg tube and tracheostomy placed, weaned from vent 01/13/2021. 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 Mildly restricted diffusion involving the cortex more diffusely over the right cerebral convexity favored to reflect an acute MCA territory infarct over global hypoxic injury given unilaterality".  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 No further acute events and pt continues to tolerate Trach collar @ 28% FiO2. #8 Shiley trach, cuffed.      SLP Plan  Continue with current plan of care       Recommendations         Patient may use Passy-Muir Speech Valve: Intermittently with supervision;During all therapies  with supervision PMSV Supervision: Full MD: Please consider changing trach tube to : Smaller size         Oral Care Recommendations: Oral care QID Follow up Recommendations: Inpatient Rehab SLP Visit Diagnosis: Cognitive communication deficit (N82.956) Plan: Continue with current plan of care       GO               Nethaniel Mattie B. Dreama Saa M.S., CCC-SLP, [facility] Speech-Language Pathologist Rehabilitation Services Office [phone]  [name] 01/27/2021, 4:21 PM

## 2021-01-28 LAB — GLUCOSE, CAPILLARY
Glucose-Capillary: 158 mg/dL — ABNORMAL HIGH (ref 70–99)
Glucose-Capillary: 172 mg/dL — ABNORMAL HIGH (ref 70–99)
Glucose-Capillary: 152 mg/dL — ABNORMAL HIGH (ref 70–99)
Glucose-Capillary: 166 mg/dL — ABNORMAL HIGH (ref 70–99)
Glucose-Capillary: 147 mg/dL — ABNORMAL HIGH (ref 70–99)
Glucose-Capillary: 146 mg/dL — ABNORMAL HIGH (ref 70–99)

## 2021-01-28 NOTE — Progress Notes (Signed)
PROGRESS NOTE  Jeremy Browning ESL:[id number] DOB: [birth year] DOA: 12/25/2020 PCP: No primary care provider on file.   LOS: 34 days   Brief Narrative / Interim history: 60 year old male with obesity, DM2, HTN, combined CHF who was admitted on 12/25/2020 with respiratory distress.  He was intubated in the ER also had VT requiring epi/cardioversion.  Hospital course complicated by persistent vent dependent respiratory failure eventually requiring tracheostomy, anoxic encephalopathy.  Significant events 5/7 severe resp failure 5/9 multiorgan failure MRI shows brain injury 5/12 failed weaning trials due to severe brain damage  5/17 no purposeful movement on wake up assessment, becomes tachypneic, asynchronous with vent 5/18 remains critically ill, severe brain damage, patient made DNR 5/20 family decided on TRACH/PEG tube 5/25 [facility] PLACED by ENT 5/26 s/p trach PEG tube 01/20/21 tolerates Trach collar @ 28% FiO2 01/25/21: declined for LTAC.  Patient is becoming little more responsive  Subjective / 24h Interval events: Alert, nonverbal.  Does not track me with his eyes  Assessment & Plan: Principal Problem Acute hypoxic respiratory failure in the setting of cardiac arrest, pulmonary edema, Proteus pneumonia -treated while in the ICU, completed a course of antibiotics with ceftriaxone and Unasyn.  Has not required vent for the past 4 days currently tolerating trach collar -Continue aggressive pulmonary toilet, continue bronchodilators -Respiratory status stable, on trach collar  Active Problems Cardiac arrest / V. Tach / non-STEMI / acute combined systolic and diastolic CHF /cardiogenic shock-requiring pressors while in the ICU, currently resolved.  Cardiology consulted and underwent a cardiac cath on 5/7 which showed normal coronaries.  A 2D echo done on 5/9 showed an EF of 45-50% with global hypokinesis of the left ventricle.  It also showed grade 1 diastolic dysfunction.  Appears euvolemic  currently  Anoxic brain encephalopathy, CVAs-high suspicion based on the MRI, clinically stable currently.  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  Neurology consulted and evaluated patient while hospitalized.  He does appear to have left-sided weakness  Acute kidney injury-resolved, baseline creatinine seems to be around 1.1-1.2  Anemia of chronic disease-hemoglobin stable, no bleeding  Scheduled Meds:  aspirin  81 mg Per Tube Daily   atorvastatin  80 mg Per Tube Daily   carvedilol  12.5 mg Per Tube BID WC   chlorhexidine gluconate (MEDLINE KIT)  15 mL Mouth Rinse BID   Chlorhexidine Gluconate Cloth  6 each Topical Q0600   docusate  100 mg Per Tube BID   enoxaparin (LOVENOX) injection  0.5 mg/kg Subcutaneous Q24H   famotidine  20 mg Per Tube Daily   feeding supplement (PROSource TF)  45 mL Per Tube BID   free water  200 mL Per Tube Q6H   hydrALAZINE  25 mg Per Tube Q6H   insulin aspart  0-15 Units Subcutaneous Q4H   mouth rinse  15 mL Mouth Rinse 10 times per day   polyethylene glycol  17 g Per Tube Daily   Continuous Infusions:  feeding supplement (NEPRO CARB STEADY) 1,000 mL (01/27/21 1837)   PRN Meds:.acetaminophen (TYLENOL) oral liquid 160 mg/5 mL, albuterol, docusate, fentaNYL (SUBLIMAZE) injection, hydrALAZINE, midazolam, oxyCODONE, polyethylene glycol, polyvinyl alcohol, sodium chloride flush, sodium chloride flush  Diet Orders (From admission, onward)     Start     Ordered   01/10/21 1632  Diet NPO time specified  Diet effective now        01/10/21 1631  DVT prophylaxis:      Code Status: DNR  Family Communication: No family at bedside this morning, discussed with sister 6/9  Status is: Inpatient  Remains inpatient appropriate because:Inpatient level of care appropriate due to severity of illness   Dispo: The patient is  from: Home              Anticipated d/c is to: SNF              Patient currently is not medically stable to d/c.   Difficult to place patient Yes   Level of care: Progressive Cardiac  Consultants:  Critical care GI Cardiology ENT Palliative care Neurology  Procedures:  2D echo Cardiac cath Trach PEG  Microbiology  none  Antimicrobials: none    Objective: Vitals:   01/28/21 0434 01/28/21 0600 01/28/21 0725 01/28/21 0802  BP: 135/62 (!) 125/48 (!) 131/49   Pulse:   69   Resp:   19   Temp: 98.4 F (36.9 C)  99.3 F (37.4 C)   TempSrc: Axillary  Axillary   SpO2: 100%  100% 100%  Weight:      Height:        Intake/Output Summary (Last 24 hours) at 01/28/2021 1013 Last data filed at 01/28/2021 0959 Gross per 24 hour  Intake --  Output 1525 ml  Net -1525 ml    Filed Weights   01/24/21 0454 01/25/21 0436 01/25/21 1005  Weight: 120.2 kg 119.9 kg 117 kg    Examination:  Constitutional: NAD Eyes: No icterus ENMT: mmm Neck: normal, supple Respiratory: Coarse breath sounds at the bases, no wheezing, no crackles Cardiovascular: Regular rate and rhythm, no edema Abdomen: Soft, NT, ND, bowel sounds positive Musculoskeletal: no clubbing / cyanosis.  Skin: No new rashes Neurologic: Spontaneously moves right side, weak on left  Data Reviewed: I have independently reviewed following labs and imaging studies   CBC: Recent Labs  Lab 01/22/21 0505 01/24/21 0455 01/26/21 0448  WBC 9.9 10.1 10.5  HGB 9.9* 9.5* 9.2*  HCT 32.1* 30.9* 28.7*  MCV 79.5* 78.2* 78.4*  PLT 231 237 573    Basic Metabolic Panel: Recent Labs  Lab 01/22/21 0505 01/23/21 0439 01/26/21 0448  NA 141 138 136  K 4.1 3.9 4.0  CL 110 109 108  CO2 '24 23 23  ' GLUCOSE 178* 140* 152*  BUN 44* 43* 43*  CREATININE 1.31* 1.24 1.25*  CALCIUM 8.4* 8.2* 8.3*    Liver Function Tests: No results for input(s): AST, ALT, ALKPHOS, BILITOT, PROT, ALBUMIN in the last 168 hours. Coagulation  Profile: No results for input(s): INR, PROTIME in the last 168 hours. HbA1C: No results for input(s): HGBA1C in the last 72 hours. CBG: Recent Labs  Lab 01/27/21 1635 01/27/21 2046 01/27/21 2352 01/28/21 0430 01/28/21 0723  GLUCAP 179* 160* 157* 158* 172*     No results found for this or any previous visit (from the past 240 hour(s)).   Radiology Studies: No results found.   Marzetta Board, MD, PhD [facility]  Between 7 am - 7 pm I am available, please contact me via Amion (for emergencies) or Securechat (non urgent messages)  Between 7 pm - 7 am I am not available, please contact night coverage MD/APP via Amion

## 2021-01-29 LAB — CBC
HCT: 33 % — ABNORMAL LOW (ref 39.0–52.0)
Hemoglobin: 10.4 g/dL — ABNORMAL LOW (ref 13.0–17.0)
MCH: 24.4 pg — ABNORMAL LOW (ref 26.0–34.0)
MCHC: 31.5 g/dL (ref 30.0–36.0)
MCV: 77.5 fL — ABNORMAL LOW (ref 80.0–100.0)
Platelets: 232 10*3/uL (ref 150–400)
RBC: 4.26 MIL/uL (ref 4.22–5.81)
RDW: 15 % (ref 11.5–15.5)
WBC: 11 10*3/uL — ABNORMAL HIGH (ref 4.0–10.5)
nRBC: 0 % (ref 0.0–0.2)

## 2021-01-29 LAB — GLUCOSE, CAPILLARY
Glucose-Capillary: 135 mg/dL — ABNORMAL HIGH (ref 70–99)
Glucose-Capillary: 137 mg/dL — ABNORMAL HIGH (ref 70–99)
Glucose-Capillary: 149 mg/dL — ABNORMAL HIGH (ref 70–99)
Glucose-Capillary: 158 mg/dL — ABNORMAL HIGH (ref 70–99)
Glucose-Capillary: 163 mg/dL — ABNORMAL HIGH (ref 70–99)
Glucose-Capillary: 174 mg/dL — ABNORMAL HIGH (ref 70–99)

## 2021-01-29 LAB — COMPREHENSIVE METABOLIC PANEL
ALT: 219 U/L — ABNORMAL HIGH (ref 0–44)
AST: 119 U/L — ABNORMAL HIGH (ref 15–41)
Albumin: 2.1 g/dL — ABNORMAL LOW (ref 3.5–5.0)
Alkaline Phosphatase: 121 U/L (ref 38–126)
Anion gap: 5 (ref 5–15)
BUN: 42 mg/dL — ABNORMAL HIGH (ref 8–23)
CO2: 26 mmol/L (ref 22–32)
Calcium: 8.5 mg/dL — ABNORMAL LOW (ref 8.9–10.3)
Chloride: 108 mmol/L (ref 98–111)
Creatinine, Ser: 1.24 mg/dL (ref 0.61–1.24)
GFR, Estimated: >60 mL/min (ref >60)
Glucose, Bld: 173 mg/dL — ABNORMAL HIGH (ref 70–99)
Potassium: 4.2 mmol/L (ref 3.5–5.1)
Sodium: 139 mmol/L (ref 135–145)
Total Bilirubin: 0.4 mg/dL (ref 0.3–1.2)
Total Protein: 7.2 g/dL (ref 6.5–8.1)

## 2021-01-29 NOTE — Progress Notes (Signed)
PROGRESS NOTE  Jeremy Browning CLE:[id number] DOB: February 04, 1961 DOA: 12/25/2020 PCP: No primary care provider on file.   LOS: 35 days   Brief Narrative / Interim history: 60 year old male with obesity, DM2, HTN, combined CHF who was admitted on 12/25/2020 with respiratory distress.  He was intubated in the ER also had VT requiring epi/cardioversion.  Hospital course complicated by persistent vent dependent respiratory failure eventually requiring tracheostomy, anoxic encephalopathy.  Significant events 5/7 severe resp failure 5/9 multiorgan failure MRI shows brain injury 5/12 failed weaning trials due to severe brain damage  5/17 no purposeful movement on wake up assessment, becomes tachypneic, asynchronous with vent 5/18 remains critically ill, severe brain damage, patient made DNR 5/20 family decided on TRACH/PEG tube 5/25 [facility] PLACED by ENT 5/26 s/p trach PEG tube 01/20/21 tolerates Trach collar @ 28% FiO2 01/25/21: declined for LTAC.  Patient is becoming little more responsive  Subjective / 24h Interval events: Unchanged, alert, nonverbal  Assessment & Plan: Principal Problem Acute hypoxic respiratory failure in the setting of cardiac arrest, pulmonary edema, Proteus pneumonia -treated while in the ICU, completed a course of antibiotics with ceftriaxone and Unasyn.  Has not required vent for the past 4 days currently tolerating trach collar -Continue aggressive pulmonary toilet, continue bronchodilators -Respiratory status stable, on trach collar  Active Problems Cardiac arrest / V. Tach / non-STEMI / acute combined systolic and diastolic CHF /cardiogenic shock-requiring pressors while in the ICU, currently resolved.  Cardiology consulted and underwent a cardiac cath on 5/7 which showed normal coronaries.  A 2D echo done on 5/9 showed an EF of 45-50% with global hypokinesis of the left ventricle.  It also showed grade 1 diastolic dysfunction.  Appears euvolemic currently  Anoxic brain  encephalopathy, CVAs-high suspicion based on the MRI, clinically stable currently.  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  Neurology consulted and evaluated patient while hospitalized.  He does appear to have left-sided weakness  Acute kidney injury-resolved, baseline creatinine seems to be around 1.1-1.2  Anemia of chronic disease-hemoglobin stable, no bleeding  Scheduled Meds:  aspirin  81 mg Per Tube Daily   atorvastatin  80 mg Per Tube Daily   carvedilol  12.5 mg Per Tube BID WC   chlorhexidine gluconate (MEDLINE KIT)  15 mL Mouth Rinse BID   Chlorhexidine Gluconate Cloth  6 each Topical Q0600   docusate  100 mg Per Tube BID   enoxaparin (LOVENOX) injection  0.5 mg/kg Subcutaneous Q24H   famotidine  20 mg Per Tube Daily   feeding supplement (PROSource TF)  45 mL Per Tube BID   free water  200 mL Per Tube Q6H   hydrALAZINE  25 mg Per Tube Q6H   insulin aspart  0-15 Units Subcutaneous Q4H   mouth rinse  15 mL Mouth Rinse 10 times per day   polyethylene glycol  17 g Per Tube Daily   Continuous Infusions:  feeding supplement (NEPRO CARB STEADY) 1,000 mL (01/27/21 1837)   PRN Meds:.acetaminophen (TYLENOL) oral liquid 160 mg/5 mL, albuterol, docusate, fentaNYL (SUBLIMAZE) injection, hydrALAZINE, midazolam, oxyCODONE, polyethylene glycol, polyvinyl alcohol, sodium chloride flush, sodium chloride flush  Diet Orders (From admission, onward)     Start     Ordered   01/10/21 1632  Diet NPO time specified  Diet effective now        01/10/21 1631  DVT prophylaxis:      Code Status: DNR  Family Communication: No family at bedside this morning, discussed with sister 6/9  Status is: Inpatient  Remains inpatient appropriate because:Inpatient level of care appropriate due to severity of illness   Dispo: The patient is from: Home               Anticipated d/c is to: SNF              Patient currently is not medically stable to d/c.   Difficult to place patient Yes   Level of care: Progressive Cardiac  Consultants:  Critical care GI Cardiology ENT Palliative care Neurology  Procedures:  2D echo Cardiac cath Trach PEG  Microbiology  none  Antimicrobials: none    Objective: Vitals:   01/29/21 0628 01/29/21 0652 01/29/21 0822 01/29/21 1217  BP:  (!) 135/58  (!) 132/48  Pulse:   72 66  Resp:   19 18  Temp:   99.8 F (37.7 C) 98.4 F (36.9 C)  TempSrc:   Oral Oral  SpO2: 100%  100% 100%  Weight:      Height:        Intake/Output Summary (Last 24 hours) at 01/29/2021 1311 Last data filed at 01/29/2021 1200 Gross per 24 hour  Intake 300 ml  Output 2750 ml  Net -2450 ml    Filed Weights   01/25/21 0436 01/25/21 1005 01/29/21 0347  Weight: 119.9 kg 117 kg 114.3 kg    Examination:  Constitutional: No distress Eyes: No icterus ENMT: mmm Neck: normal, supple Respiratory: No wheezing Cardiovascular: Heart regular, no edema Abdomen: Soft, NT, ND, bowel sounds positive Musculoskeletal: no clubbing / cyanosis.  Skin: No new rashes Neurologic: Spontaneously moves right side, weak on left  Data Reviewed: I have independently reviewed following labs and imaging studies   CBC: Recent Labs  Lab 01/24/21 0455 01/26/21 0448 01/29/21 0500  WBC 10.1 10.5 11.0*  HGB 9.5* 9.2* 10.4*  HCT 30.9* 28.7* 33.0*  MCV 78.2* 78.4* 77.5*  PLT 237 257 872    Basic Metabolic Panel: Recent Labs  Lab 01/23/21 0439 01/26/21 0448 01/29/21 0500  NA 138 136 139  K 3.9 4.0 4.2  CL 109 108 108  CO2 '23 23 26  ' GLUCOSE 140* 152* 173*  BUN 43* 43* 42*  CREATININE 1.24 1.25* 1.24  CALCIUM 8.2* 8.3* 8.5*    Liver Function Tests: Recent Labs  Lab 01/29/21 0500  AST 119*  ALT 219*  ALKPHOS 121  BILITOT 0.4  PROT 7.2  ALBUMIN 2.1*   Coagulation Profile: No results for input(s): INR, PROTIME in the last  168 hours. HbA1C: No results for input(s): HGBA1C in the last 72 hours. CBG: Recent Labs  Lab 01/28/21 1956 01/28/21 2314 01/29/21 0348 01/29/21 0825 01/29/21 1219  GLUCAP 147* 146* 158* 163* 135*     No results found for this or any previous visit (from the past 240 hour(s)).   Radiology Studies: No results found.   Marzetta Board, MD, PhD [facility]  Between 7 am - 7 pm I am available, please contact me via Amion (for emergencies) or Securechat (non urgent messages)  Between 7 pm - 7 am I am not available, please contact night coverage MD/APP via Amion

## 2021-01-30 LAB — COMPREHENSIVE METABOLIC PANEL
ALT: 245 U/L — ABNORMAL HIGH (ref 0–44)
AST: 157 U/L — ABNORMAL HIGH (ref 15–41)
Albumin: 2.1 g/dL — ABNORMAL LOW (ref 3.5–5.0)
Alkaline Phosphatase: 122 U/L (ref 38–126)
Anion gap: 5 (ref 5–15)
BUN: 43 mg/dL — ABNORMAL HIGH (ref 8–23)
CO2: 26 mmol/L (ref 22–32)
Calcium: 8.6 mg/dL — ABNORMAL LOW (ref 8.9–10.3)
Chloride: 108 mmol/L (ref 98–111)
Creatinine, Ser: 1.16 mg/dL (ref 0.61–1.24)
GFR, Estimated: >60 mL/min (ref >60)
Glucose, Bld: 168 mg/dL — ABNORMAL HIGH (ref 70–99)
Potassium: 4.3 mmol/L (ref 3.5–5.1)
Sodium: 139 mmol/L (ref 135–145)
Total Bilirubin: 0.4 mg/dL (ref 0.3–1.2)
Total Protein: 7.3 g/dL (ref 6.5–8.1)

## 2021-01-30 LAB — GLUCOSE, CAPILLARY
Glucose-Capillary: 152 mg/dL — ABNORMAL HIGH (ref 70–99)
Glucose-Capillary: 148 mg/dL — ABNORMAL HIGH (ref 70–99)
Glucose-Capillary: 146 mg/dL — ABNORMAL HIGH (ref 70–99)
Glucose-Capillary: 160 mg/dL — ABNORMAL HIGH (ref 70–99)
Glucose-Capillary: 138 mg/dL — ABNORMAL HIGH (ref 70–99)
Glucose-Capillary: 144 mg/dL — ABNORMAL HIGH (ref 70–99)
Glucose-Capillary: 121 mg/dL — ABNORMAL HIGH (ref 70–99)

## 2021-01-30 NOTE — Progress Notes (Signed)
PROGRESS NOTE  Jeremy Browning PFX:[id number] DOB: 07/13/1961 DOA: 12/25/2020 PCP: No primary care provider on file.   LOS: 36 days   Brief Narrative / Interim history: 60 year old male with obesity, DM2, HTN, combined CHF who was admitted on 12/25/2020 with respiratory distress.  He was intubated in the ER also had VT requiring epi/cardioversion.  Hospital course complicated by persistent vent dependent respiratory failure eventually requiring tracheostomy, anoxic encephalopathy.  Significant events 5/7 severe resp failure 5/9 multiorgan failure MRI shows brain injury 5/12 failed weaning trials due to severe brain damage  5/17 no purposeful movement on wake up assessment, becomes tachypneic, asynchronous with vent 5/18 remains critically ill, severe brain damage, patient made DNR 5/20 family decided on TRACH/PEG tube 5/25 [facility] PLACED by ENT 5/26 s/p trach PEG tube 01/20/21 tolerates Trach collar @ 28% FiO2 01/25/21: declined for LTAC.  Patient is becoming little more responsive  Subjective / 24h Interval events: Alert, nonverbal  Assessment & Plan: Principal Problem Acute hypoxic respiratory failure in the setting of cardiac arrest, pulmonary edema, Proteus pneumonia -treated while in the ICU, completed a course of antibiotics with ceftriaxone and Unasyn.  Has not required vent for the past 4 days currently tolerating trach collar -Continue aggressive pulmonary toilet, continue bronchodilators -Respiratory status stable, remains on trach collar  Active Problems Cardiac arrest / V. Tach / non-STEMI / acute combined systolic and diastolic CHF /cardiogenic shock-requiring pressors while in the ICU, currently resolved.  Cardiology consulted and underwent a cardiac cath on 5/7 which showed normal coronaries.  A 2D echo done on 5/9 showed an EF of 45-50% with global hypokinesis of the left ventricle.  It also showed grade 1 diastolic dysfunction.  Appears euvolemic  Elevated LFTs -noticed on  6/11, this morning's LFTs are pending.  I discontinued his statin and Tylenol for now. Hepatitis panel was nonreactive on 5/8.  Monitor  Anoxic brain encephalopathy, CVAs-high suspicion based on the MRI, clinically stable currently.  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  Neurology consulted and evaluated patient while hospitalized.  He does appear to have left-sided weakness  Acute kidney injury-resolved, baseline creatinine seems to be around 1.1-1.2  Anemia of chronic disease-hemoglobin stable, no bleeding  Scheduled Meds:  aspirin  81 mg Per Tube Daily   carvedilol  12.5 mg Per Tube BID WC   chlorhexidine gluconate (MEDLINE KIT)  15 mL Mouth Rinse BID   Chlorhexidine Gluconate Cloth  6 each Topical Q0600   docusate  100 mg Per Tube BID   enoxaparin (LOVENOX) injection  0.5 mg/kg Subcutaneous Q24H   famotidine  20 mg Per Tube Daily   feeding supplement (PROSource TF)  45 mL Per Tube BID   free water  200 mL Per Tube Q6H   hydrALAZINE  25 mg Per Tube Q6H   insulin aspart  0-15 Units Subcutaneous Q4H   mouth rinse  15 mL Mouth Rinse 10 times per day   polyethylene glycol  17 g Per Tube Daily   Continuous Infusions:  feeding supplement (NEPRO CARB STEADY) 1,000 mL (01/30/21 1036)   PRN Meds:.albuterol, docusate, fentaNYL (SUBLIMAZE) injection, hydrALAZINE, oxyCODONE, polyethylene glycol, polyvinyl alcohol, sodium chloride flush, sodium chloride flush  Diet Orders (From admission, onward)     Start     Ordered   01/10/21 1632  Diet NPO time specified  Diet effective now        01/10/21 1631  DVT prophylaxis:      Code Status: DNR  Family Communication: No family at bedside this morning, discussed with sister 6/9  Status is: Inpatient  Remains inpatient appropriate because:Inpatient level of care appropriate due to severity of  illness   Dispo: The patient is from: Home              Anticipated d/c is to: SNF              Patient currently is not medically stable to d/c.   Difficult to place patient Yes   Level of care: Progressive Cardiac  Consultants:  Critical care GI Cardiology ENT Palliative care Neurology  Procedures:  2D echo Cardiac cath Trach PEG  Microbiology  none  Antimicrobials: none    Objective: Vitals:   01/29/21 1900 01/29/21 2343 01/30/21 0400 01/30/21 0500  BP: (!) 121/47 (!) 120/43 (!) 128/39   Pulse: 63 70 75   Resp:      Temp: 98.3 F (36.8 C) 98.3 F (36.8 C) 98.8 F (37.1 C)   TempSrc: Axillary Axillary Axillary   SpO2: 99% 99% 98%   Weight:    116.3 kg  Height:        Intake/Output Summary (Last 24 hours) at 01/30/2021 1126 Last data filed at 01/30/2021 0700 Gross per 24 hour  Intake 1200 ml  Output 1000 ml  Net 200 ml    Filed Weights   01/25/21 1005 01/29/21 0347 01/30/21 0500  Weight: 117 kg 114.3 kg 116.3 kg    Examination:  Constitutional: NAD Eyes: No icterus ENMT: Moist mucous membranes Neck: normal, supple Respiratory: Clear bilaterally without wheezing Cardiovascular: Regular rate and rhythm, no peripheral edema Abdomen: Soft, nontender, bowel sounds positive Musculoskeletal: no clubbing / cyanosis.  Skin: No new rashes Neurologic: Follows commands on the right side, left side is flaccid  Data Reviewed: I have independently reviewed following labs and imaging studies   CBC: Recent Labs  Lab 01/24/21 0455 01/26/21 0448 01/29/21 0500  WBC 10.1 10.5 11.0*  HGB 9.5* 9.2* 10.4*  HCT 30.9* 28.7* 33.0*  MCV 78.2* 78.4* 77.5*  PLT 237 257 412    Basic Metabolic Panel: Recent Labs  Lab 01/26/21 0448 01/29/21 0500  NA 136 139  K 4.0 4.2  CL 108 108  CO2 23 26  GLUCOSE 152* 173*  BUN 43* 42*  CREATININE 1.25* 1.24  CALCIUM 8.3* 8.5*    Liver Function Tests: Recent Labs  Lab 01/29/21 0500  AST 119*  ALT 219*   ALKPHOS 121  BILITOT 0.4  PROT 7.2  ALBUMIN 2.1*    Coagulation Profile: No results for input(s): INR, PROTIME in the last 168 hours. HbA1C: No results for input(s): HGBA1C in the last 72 hours. CBG: Recent Labs  Lab 01/29/21 1650 01/29/21 2053 01/29/21 2343 01/30/21 0336 01/30/21 0800  GLUCAP 174* 149* 137* 152* 148*     No results found for this or any previous visit (from the past 240 hour(s)).   Radiology Studies: No results found.   Marzetta Board, MD, PhD [facility]  Between 7 am - 7 pm I am available, please contact me via Amion (for emergencies) or Securechat (non urgent messages)  Between 7 pm - 7 am I am not available, please contact night coverage MD/APP via Amion

## 2021-01-31 ENCOUNTER — Inpatient Hospital Stay: Payer: Medicaid Other

## 2021-01-31 LAB — COMPREHENSIVE METABOLIC PANEL
ALT: 217 U/L — ABNORMAL HIGH (ref 0–44)
AST: 114 U/L — ABNORMAL HIGH (ref 15–41)
Albumin: 2.2 g/dL — ABNORMAL LOW (ref 3.5–5.0)
Alkaline Phosphatase: 126 U/L (ref 38–126)
Anion gap: 5 (ref 5–15)
BUN: 39 mg/dL — ABNORMAL HIGH (ref 8–23)
CO2: 27 mmol/L (ref 22–32)
Calcium: 8.7 mg/dL — ABNORMAL LOW (ref 8.9–10.3)
Chloride: 109 mmol/L (ref 98–111)
Creatinine, Ser: 1.25 mg/dL — ABNORMAL HIGH (ref 0.61–1.24)
GFR, Estimated: >60 mL/min (ref >60)
Glucose, Bld: 123 mg/dL — ABNORMAL HIGH (ref 70–99)
Potassium: 4.6 mmol/L (ref 3.5–5.1)
Sodium: 141 mmol/L (ref 135–145)
Total Bilirubin: 0.5 mg/dL (ref 0.3–1.2)
Total Protein: 7.7 g/dL (ref 6.5–8.1)

## 2021-01-31 LAB — GLUCOSE, CAPILLARY
Glucose-Capillary: 101 mg/dL — ABNORMAL HIGH (ref 70–99)
Glucose-Capillary: 130 mg/dL — ABNORMAL HIGH (ref 70–99)
Glucose-Capillary: 127 mg/dL — ABNORMAL HIGH (ref 70–99)
Glucose-Capillary: 153 mg/dL — ABNORMAL HIGH (ref 70–99)
Glucose-Capillary: 184 mg/dL — ABNORMAL HIGH (ref 70–99)
Glucose-Capillary: 158 mg/dL — ABNORMAL HIGH (ref 70–99)

## 2021-01-31 IMAGING — US US ABDOMEN LIMITED
1 series · 14 of 25 positions shown · non-contrast
Comparison: None.

CLINICAL DATA: Elevated liver function tests

EXAM:
ULTRASOUND ABDOMEN LIMITED RIGHT UPPER QUADRANT

[Series 1: us abdomen limited ruq (liver/gb) · 14 of 56 slices shown]
[im 1/56]
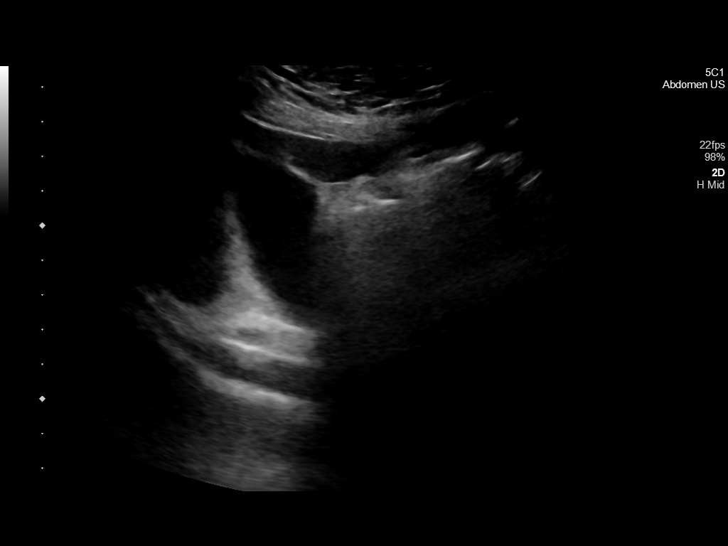
[im 5/56]
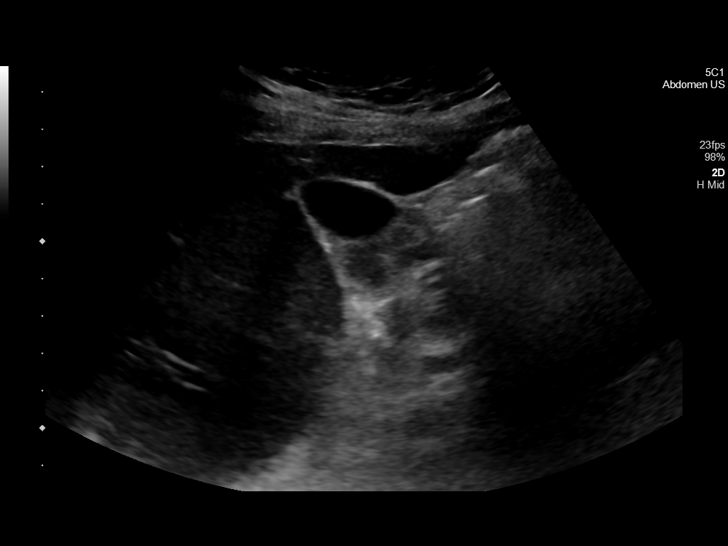
[im 10/56]
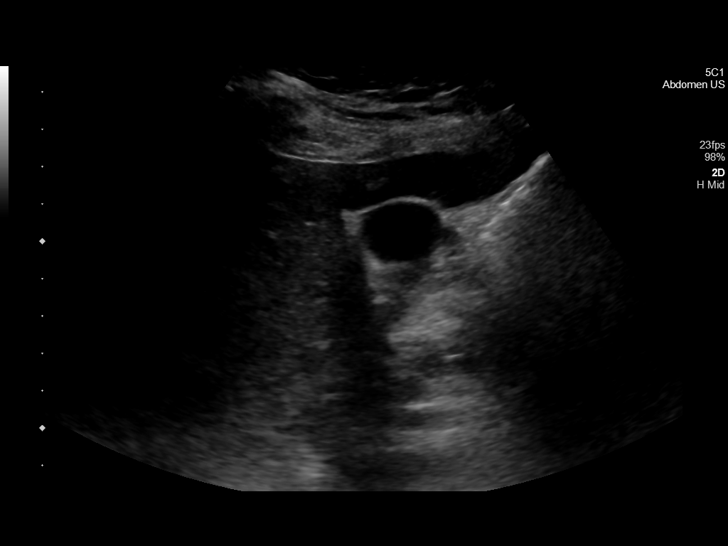
[im 14/56]
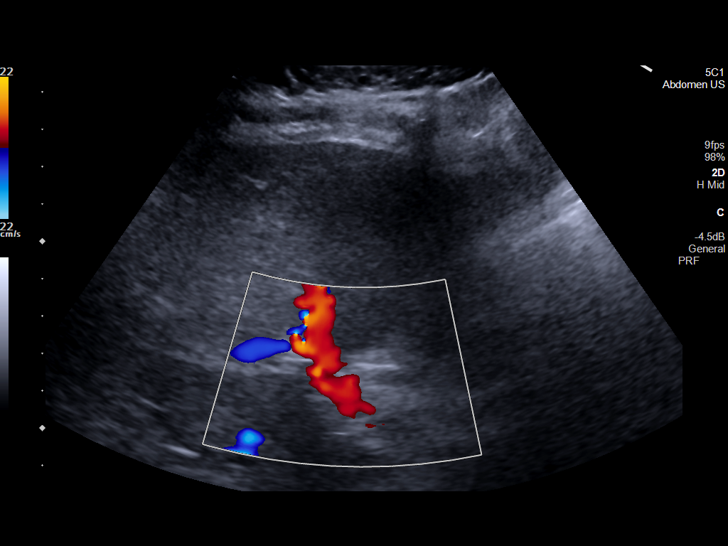
[im 19/56]
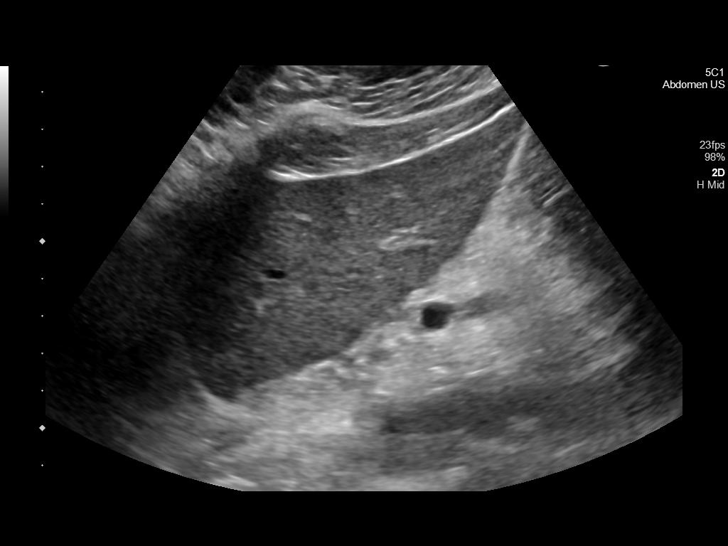
[im 21/56]
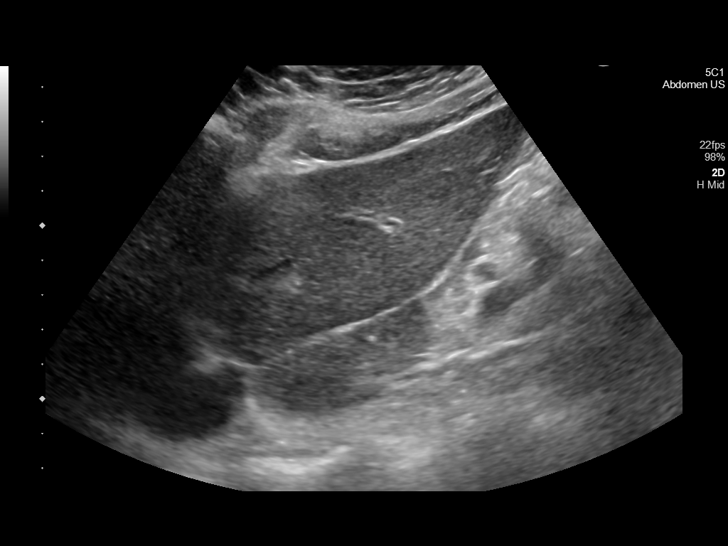
[im 26/56]
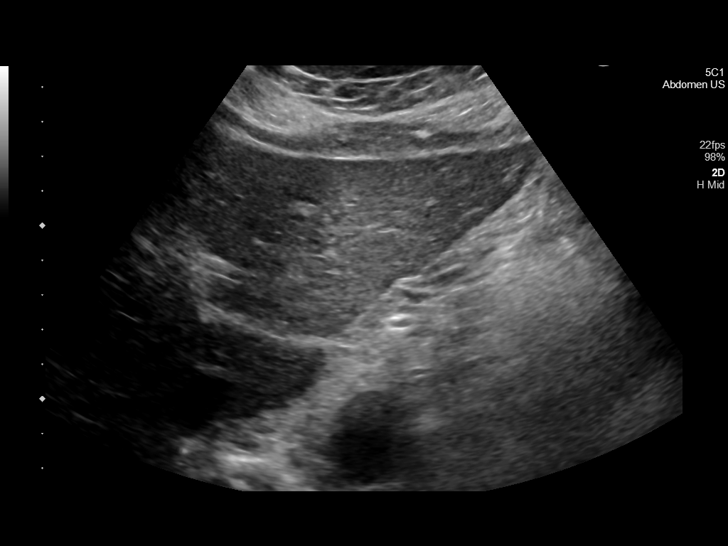
[im 30/56]
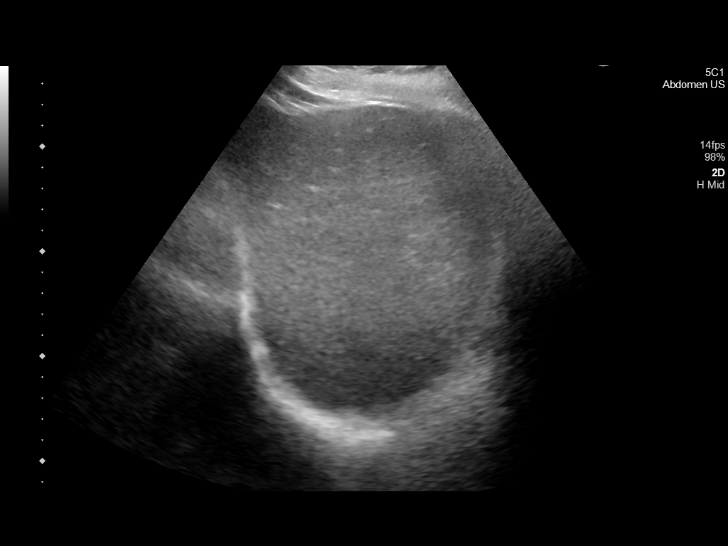
[im 35/56]
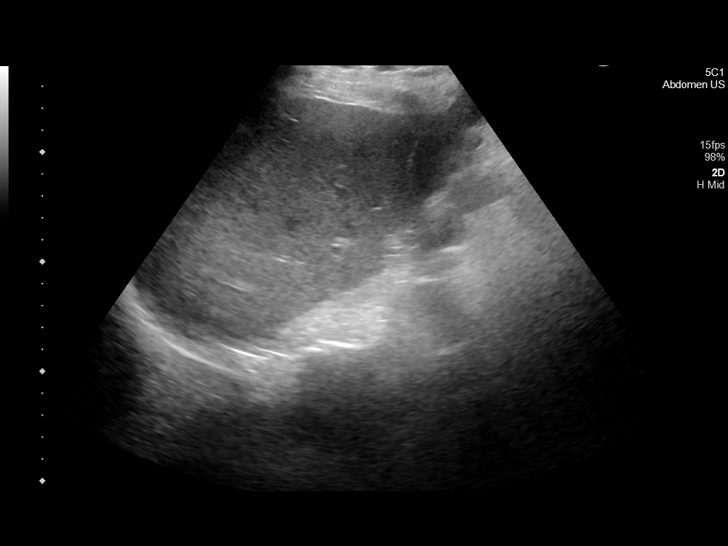
[im 37/56]
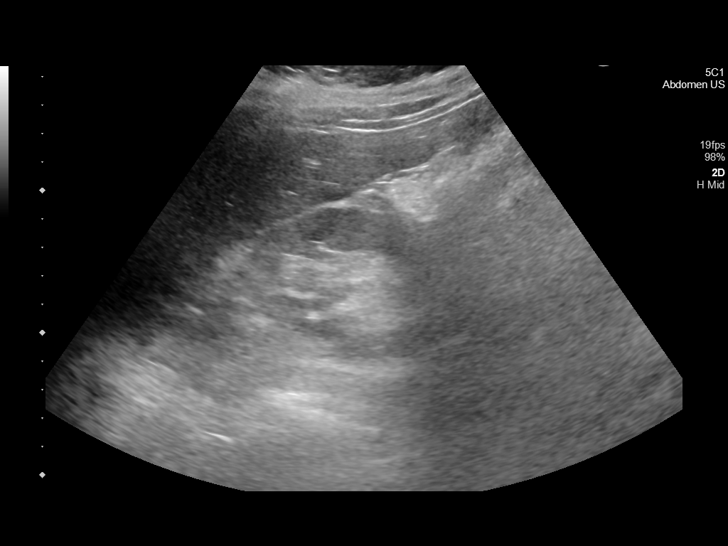
[im 42/56]
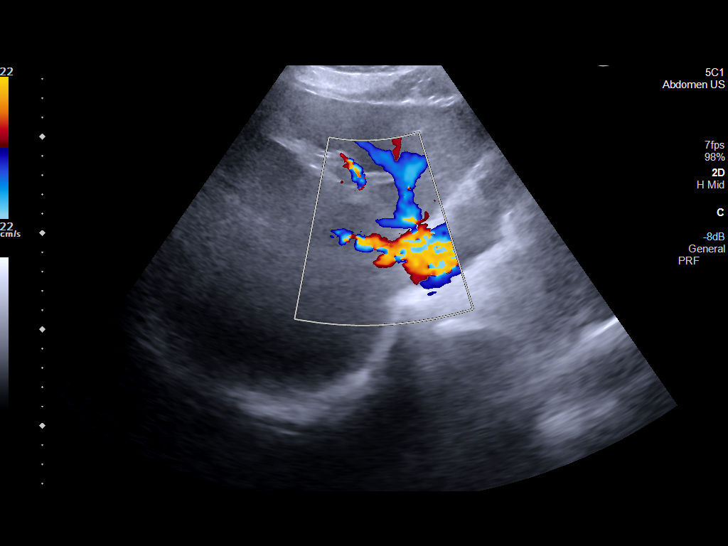
[im 46/56]
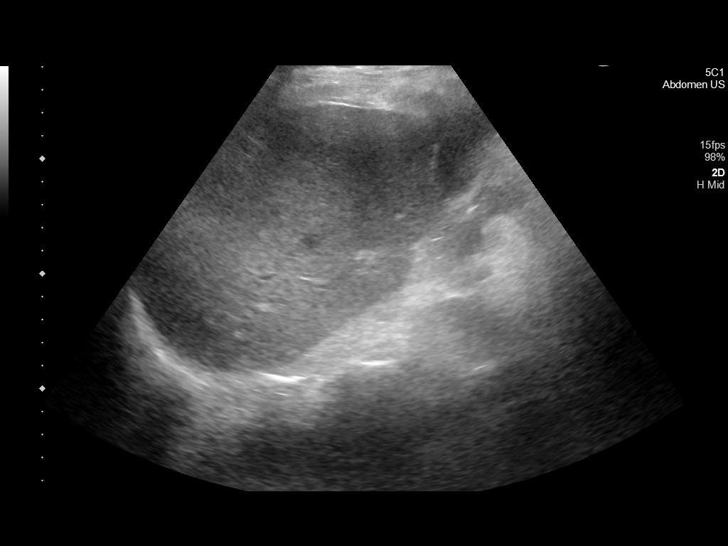
[im 51/56]
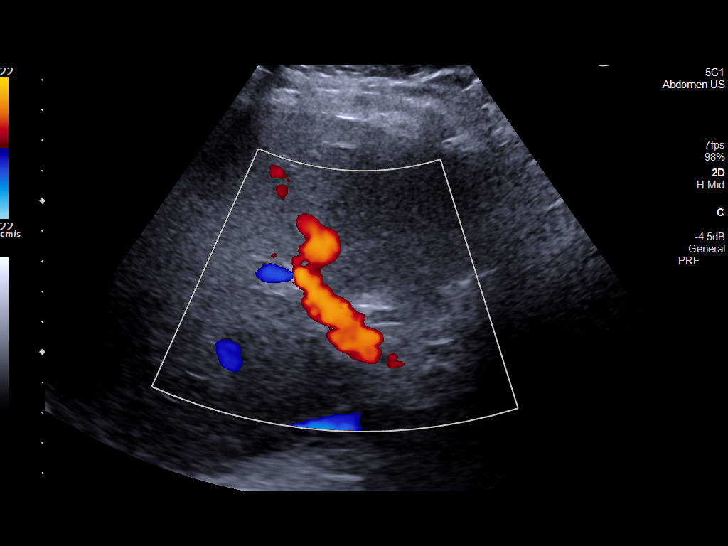
[im 56/56]
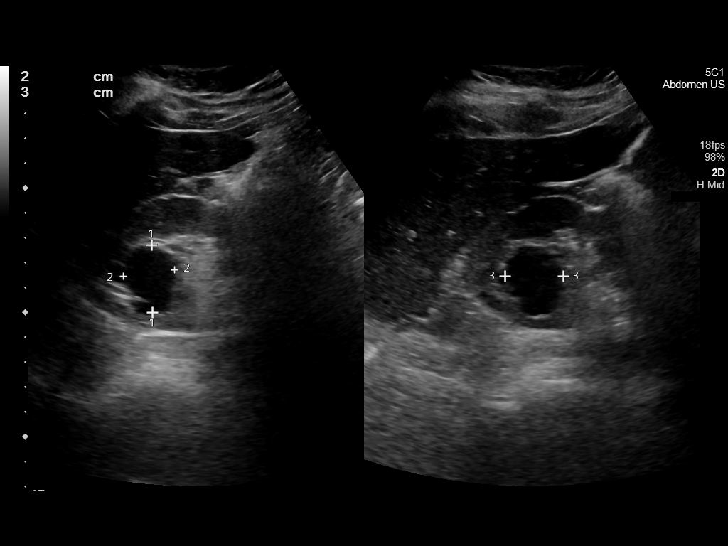

[14 of 25 positions shown; findings below may reference images not displayed]

FINDINGS: Gallbladder:

No gallstones or wall thickening visualized. No sonographic Murphy
sign noted by sonographer.

Common bile duct:

Diameter: 5 mm

Liver:

No focal lesion identified. Within normal limits in parenchymal
echogenicity. Portal vein is patent on color Doppler imaging with
normal direction of blood flow towards the liver.

Other: Simple right upper pole cyst measuring 2.7 cm maximal
IMPRESSION: Negative right upper quadrant ultrasound.

## 2021-01-31 NOTE — Progress Notes (Signed)
PROGRESS NOTE  Jeremy Browning OIP:[id number] DOB: 06-17-1961 DOA: 12/25/2020 PCP: No primary care provider on file.   LOS: 37 days   Brief Narrative / Interim history: 60 year old male with obesity, DM2, HTN, combined CHF who was admitted on 12/25/2020 with respiratory distress.  He was intubated in the ER also had VT requiring epi/cardioversion.  Hospital course complicated by persistent vent dependent respiratory failure eventually requiring tracheostomy, anoxic encephalopathy.  Significant events 5/7 severe resp failure 5/9 multiorgan failure MRI shows brain injury 5/12 failed weaning trials due to severe brain damage  5/17 no purposeful movement on wake up assessment, becomes tachypneic, asynchronous with vent 5/18 remains critically ill, severe brain damage, patient made DNR 5/20 family decided on TRACH/PEG tube 5/25 [facility] PLACED by ENT 5/26 s/p trach PEG tube 01/20/21 tolerates Trach collar @ 28% FiO2 01/25/21: declined for LTAC.  Patient is becoming little more responsive  Subjective / 24h Interval events: Tries to talk today, valve placed on and he can answer few basic questions  Assessment & Plan: Principal Problem Acute hypoxic respiratory failure in the setting of cardiac arrest, pulmonary edema, Proteus pneumonia -treated while in the ICU, completed a course of antibiotics with ceftriaxone and Unasyn.  Has not required vent for the past 4 days currently tolerating trach collar -Continue aggressive pulmonary toilet, continue bronchodilators -Respiratory status stable, remains on trach collar  Active Problems Cardiac arrest / V. Tach / non-STEMI / acute combined systolic and diastolic CHF /cardiogenic shock-requiring pressors while in the ICU, currently resolved.  Cardiology consulted and underwent a cardiac cath on 5/7 which showed normal coronaries.  A 2D echo done on 5/9 showed an EF of 45-50% with global hypokinesis of the left ventricle.  It also showed grade 1 diastolic  dysfunction.  Appears euvolemic  Elevated LFTs -noticed on 6/11, overall trending down.  Hepatitis panel negative, right upper quadrant ultrasound negative.  Discontinue Tylenol and statin  Anoxic brain encephalopathy, CVAs-high suspicion based on the MRI, clinically stable currently.  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  Neurology consulted and evaluated patient while hospitalized.  He does appear to have left-sided weakness which is persistent, however he is gradually improving and more interactive in the last day  Acute kidney injury-resolved, baseline creatinine seems to be around 1.1-1.2  Anemia of chronic disease-hemoglobin stable, no bleeding  Scheduled Meds:  aspirin  81 mg Per Tube Daily   carvedilol  12.5 mg Per Tube BID WC   chlorhexidine gluconate (MEDLINE KIT)  15 mL Mouth Rinse BID   Chlorhexidine Gluconate Cloth  6 each Topical Q0600   docusate  100 mg Per Tube BID   enoxaparin (LOVENOX) injection  0.5 mg/kg Subcutaneous Q24H   famotidine  20 mg Per Tube Daily   feeding supplement (PROSource TF)  45 mL Per Tube BID   free water  200 mL Per Tube Q6H   hydrALAZINE  25 mg Per Tube Q6H   insulin aspart  0-15 Units Subcutaneous Q4H   mouth rinse  15 mL Mouth Rinse 10 times per day   polyethylene glycol  17 g Per Tube Daily   Continuous Infusions:  feeding supplement (NEPRO CARB STEADY) 60 mL/hr at 01/30/21 2300   PRN Meds:.albuterol, docusate, fentaNYL (SUBLIMAZE) injection, hydrALAZINE, oxyCODONE, polyethylene glycol, polyvinyl alcohol, sodium chloride flush, sodium chloride flush  Diet Orders (From admission, onward)     Start  Ordered   01/10/21 1632  Diet NPO time specified  Diet effective now        01/10/21 1631            DVT prophylaxis:      Code Status: DNR  Family Communication: No family at bedside this morning  Status  is: Inpatient  Remains inpatient appropriate because:Inpatient level of care appropriate due to severity of illness   Dispo: The patient is from: Home              Anticipated d/c is to: SNF              Patient currently is not medically stable to d/c.   Difficult to place patient Yes   Level of care: Progressive Cardiac  Consultants:  Critical care GI Cardiology ENT Palliative care Neurology  Procedures:  2D echo Cardiac cath Trach PEG  Microbiology  none  Antimicrobials: none    Objective: Vitals:   01/30/21 2011 01/31/21 0400 01/31/21 0418 01/31/21 0726  BP: (!) 133/43 (!) 136/50  (!) 141/53  Pulse: 68 68  70  Resp: 17   16  Temp: 99.8 F (37.7 C) 98.6 F (37 C)  98.5 F (36.9 C)  TempSrc: Oral Axillary  Axillary  SpO2: 100% 99%  99%  Weight:   115.7 kg   Height:        Intake/Output Summary (Last 24 hours) at 01/31/2021 0951 Last data filed at 01/30/2021 1700 Gross per 24 hour  Intake --  Output 2200 ml  Net -2200 ml    Filed Weights   01/29/21 0347 01/30/21 0500 01/31/21 0418  Weight: 114.3 kg 116.3 kg 115.7 kg    Examination:  Constitutional: nad Eyes: anicteric ENMT: mmm Neck: normal, supple Respiratory: CTA biL, no wheezing Cardiovascular: rrr, no mrg, no edema  Abdomen: soft, nt, nd, bs+ Musculoskeletal: no clubbing / cyanosis.  Skin: no rashes Neurologic: good strength on right, left side is flaccid  Data Reviewed: I have independently reviewed following labs and imaging studies   CBC: Recent Labs  Lab 01/26/21 0448 01/29/21 0500  WBC 10.5 11.0*  HGB 9.2* 10.4*  HCT 28.7* 33.0*  MCV 78.4* 77.5*  PLT 257 465    Basic Metabolic Panel: Recent Labs  Lab 01/26/21 0448 01/29/21 0500 01/30/21 1310 01/31/21 0525  NA 136 139 139 141  K 4.0 4.2 4.3 4.6  CL 108 108 108 109  CO2 _0 GLUCOSE 152* 173* 168* 123*  BUN 43* 42* 43* 39*  CREATININE 1.25* 1.24 1.16 1.25*  CALCIUM 8.3* 8.5* 8.6* 8.7*    Liver  Function Tests: Recent Labs  Lab 01/29/21 0500 01/30/21 1310 01/31/21 0525  AST 119* 157* 114*  ALT 219* 245* 217*  ALKPHOS 121 122 126  BILITOT 0.4 0.4 0.5  PROT 7.2 7.3 7.7  ALBUMIN 2.1* 2.1* 2.2*    Coagulation Profile: No results for input(s): INR, PROTIME in the last 168 hours. HbA1C: No results for input(s): HGBA1C in the last 72 hours. CBG: Recent Labs  Lab 01/30/21 2009 01/30/21 2024 01/30/21 2305 01/31/21 0346 01/31/21 0727  GLUCAP 138* 144* 121* 101* 130*     No results found for this or any previous visit (from the past 240 hour(s)).   Radiology Studies: US Abdomen Limited RUQ (LIVER/GB)  Result Date: 01/31/2021 CLINICAL DATA:  Elevated liver function tests EXAM: ULTRASOUND ABDOMEN LIMITED RIGHT UPPER QUADRANT COMPARISON:  None. FINDINGS: Gallbladder: No gallstones or wall thickening visualized. No  sonographic Percell Miller sign noted by sonographer. Common bile duct: Diameter: 5 mm Liver: No focal lesion identified. Within normal limits in parenchymal echogenicity. Portal vein is patent on color Doppler imaging with normal direction of blood flow towards the liver. Other: Simple right upper pole cyst measuring 2.7 cm maximal IMPRESSION: Negative right upper quadrant ultrasound. Electronically Signed   By: Monte Fantasia M.D.   On: 01/31/2021 09:28     Marzetta Board, MD, PhD [facility]  Between 7 am - 7 pm I am available, please contact me via Amion (for emergencies) or Securechat (non urgent messages)  Between 7 pm - 7 am I am not available, please contact night coverage MD/APP via Amion

## 2021-01-31 NOTE — Progress Notes (Signed)
Physical Therapy Treatment Patient Details Name: Jeremy Browning MRN: [id number] DOB: [birth year] Today's Date: 01/31/2021    History of Present Illness Pt is a 60 yo male that presented from home with respiratory distress 12/25/2020. Underwent defibrillation and CPR, emergently intubated. During admission pt has had a cardiac cath, peg tube and tracheostomy placed, weaned from vent 5/26 AM. 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 Mildly restricted diffusion involving the cortex more diffusely over the right cerebral convexity favored to reflect an acute MCA territory infarct over global hypoxic injury given unilaterality".    PT Comments    Pt awakens easily for session.  BLE AAROM prior to sitting.  He does voice R knee pain with ROM.  He is able to transition to EOB with max a x 2 but once sitting remain upright x 20 minutes with min guard/assist x 1.  +2 assist was able to leave and help another therapist and return to assist with return to supine.  While sitting he does tap toes upon request for several reps.  He does fatigue while sitting needing some increased assist as time increases but tolerates well.  Returns to supine with max a/dependant assist +2.    Pt continues to show progression towards goal being more alert and engaged during session.     Follow Up Recommendations  CIR     Equipment Recommendations  None recommended by PT    Recommendations for Other Services       Precautions / Restrictions Precautions Precautions: Fall Precaution Comments: trach, PEG Restrictions Weight Bearing Restrictions: No    Mobility  Bed Mobility Overal bed mobility: Needs Assistance Bed Mobility: Rolling Rolling: Max assist;Total assist   Supine to sit: Total assist;+2 for physical assistance          Transfers                 General transfer comment: not attempted focused on upright posture, sitting,  endurance  Ambulation/Gait                 Stairs             Wheelchair Mobility    Modified Rankin (Stroke Patients Only)       Balance Overall balance assessment: Needs assistance Sitting-balance support: Feet unsupported;Bilateral upper extremity supported Sitting balance-Leahy Scale: Poor Sitting balance - Comments: Pt able to sit x 20 minutes with hands on min gaurd assist.  Occasional min assist due to left lean but overall does quite well. Postural control: Left lateral lean Standing balance support: Bilateral upper extremity supported   Standing balance comment: did not attempt                            Cognition Arousal/Alertness: Awake/alert Behavior During Therapy: WFL for tasks assessed/performed;Flat affect Overall Cognitive Status: Impaired/Different from baseline Area of Impairment: Rancho level               Rancho Levels of Cognitive Functioning Rancho Los Amigos Scales of Cognitive Functioning: Localized response                      Exercises Other Exercises Other Exercises: BLE AAROM in supine.  R knee pain evident and pt yells out /flinches with movement    General Comments        Pertinent Vitals/Pain Pain Assessment: Faces Faces Pain Scale: Hurts even more Pain  Location: R knee - he is able to voice knee today Pain Descriptors / Indicators: Grimacing;Guarding Pain Intervention(s): Limited activity within patient's tolerance;Repositioned;Monitored during session    Home Living                      Prior Function            PT Goals (current goals can now be found in the care plan section) Acute Rehab PT Goals Patient Stated Goal: none stated Progress towards PT goals: Progressing toward goals    Frequency    Min 2X/week      PT Plan Current plan remains appropriate    Co-evaluation              AM-PAC PT "6 Clicks" Mobility   Outcome Measure  Help needed turning  from your back to your side while in a flat bed without using bedrails?: Total Help needed moving from lying on your back to sitting on the side of a flat bed without using bedrails?: Total Help needed moving to and from a bed to a chair (including a wheelchair)?: Total Help needed standing up from a chair using your arms (e.g., wheelchair or bedside chair)?: Total Help needed to walk in hospital room?: Total Help needed climbing 3-5 steps with a railing? : Total 6 Click Score: 6    End of Session   Activity Tolerance: Patient tolerated treatment well Patient left: in bed;with call bell/phone within reach;with bed alarm set Nurse Communication: Mobility status PT Visit Diagnosis: Other abnormalities of gait and mobility (R26.89);Muscle weakness (generalized) (M62.81);Other symptoms and signs involving the nervous system (R29.898)     Time: 1426-1450 PT Time Calculation (min) (ACUTE ONLY): 24 min  Charges:  $Therapeutic Exercise: 8-22 mins $Therapeutic Activity: 8-22 mins                    Danielle Dess, PTA 01/31/21, 4:17 PM , 4:12 PM

## 2021-01-31 NOTE — Progress Notes (Signed)
Occupational Therapy Treatment Patient Details Name: Jeremy Browning MRN: [id number] DOB: 02/05/1961 Today's Date: 01/31/2021    History of present illness Pt is a 60 yo male that presented from home with respiratory distress 12/25/2020. Underwent defibrillation and CPR, emergently intubated. During admission pt has had a cardiac cath, peg tube and tracheostomy placed, weaned from vent 5/26 AM. 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 Mildly restricted diffusion involving the cortex more diffusely over the right cerebral convexity favored to reflect an acute MCA territory infarct over global hypoxic injury given unilaterality".   OT comments  Upon entering the room, pt supine in bed with eyes open and alert. PMSV in place during session and pt with no apparent distress and O2 at 93% or better with activities. OT greets pt and he replies, " Hi, Dimitriy Carreras" and pt continues to have tangential speech that is clear but not relative to task or conversation/questioning. Wash cloth placed on pt's face with verbal commands to wash face. Pt does not initiate on his own and needs hand over hand to complete task. OT assisted pt with oral care with use of suction toothbrush with pt following commands to open mouth and stick out tongue with increased time to process. Removal of prevalon boots and B resting hand splints for skin inspection and this time noted pt to have had BM. Max - total A to roll pt L <> R for hygiene. Repositioning for comfort at end of session and pt lifts R hand and grabs onto bed rail. He was otherwise unable to follow commands to squeeze hands or lift arms this session. Pt visual tracked therapist throughout the room in both directions which is an improvement. Pt making great progress towards goals this session. OT continues to recommends intensive inpatient rehab program to address occupational performance.    Follow Up Recommendations   CIR;Supervision/Assistance - 24 hour    Equipment Recommendations  Other (comment) (defer to next venue of care)    Recommendations for Other Services Rehab consult    Precautions / Restrictions Precautions Precautions: Fall Precaution Comments: trach, PEG       Mobility Bed Mobility Overal bed mobility: Needs Assistance Bed Mobility: Rolling Rolling: Max assist;Total assist                                        ADL either performed or assessed with clinical judgement   ADL Overall ADL's : Needs assistance/impaired     Grooming: Wash/dry hands;Wash/dry face;Oral care Grooming Details (indicate cue type and reason): total hand over hand assist but following commands for oral care this session                                     Vision Baseline Vision/History: No visual deficits            Cognition Arousal/Alertness: Awake/alert Behavior During Therapy: Flat affect Overall Cognitive Status: Impaired/Different from baseline Area of Impairment: Rancho level               Rancho Levels of Cognitive Functioning Rancho Los Amigos Scales of Cognitive Functioning: Localized response  Pertinent Vitals/ Pain       Pain Assessment: Faces Faces Pain Scale: Hurts little more Pain Location: R LE Pain Descriptors / Indicators: Grimacing;Guarding Pain Intervention(s): Limited activity within patient's tolerance;Repositioned;Monitored during session         Frequency  Min 2X/week        Progress Toward Goals  OT Goals(current goals can now be found in the care plan section)  Progress towards OT goals: Progressing toward goals  Acute Rehab OT Goals Patient Stated Goal: none stated OT Goal Formulation: With patient Time For Goal Achievement: 02/08/21 Potential to Achieve Goals: Fair  Plan Discharge plan remains appropriate;Frequency remains appropriate       AM-PAC OT "6  Clicks" Daily Activity     Outcome Measure   Help from another person eating meals?: Total Help from another person taking care of personal grooming?: A Lot Help from another person toileting, which includes using toliet, bedpan, or urinal?: Total Help from another person bathing (including washing, rinsing, drying)?: Total Help from another person to put on and taking off regular upper body clothing?: Total Help from another person to put on and taking off regular lower body clothing?: Total 6 Click Score: 7    End of Session Equipment Utilized During Treatment: Oxygen;Other (comment) (trach)  OT Visit Diagnosis: Other symptoms and signs involving the nervous system (R29.898);Other symptoms and signs involving cognitive function   Activity Tolerance Patient tolerated treatment well   Patient Left in bed;with call bell/phone within reach;with bed alarm set   Nurse Communication Mobility status        Time: 8850-2774 OT Time Calculation (min): 40 min  Charges: OT General Charges $OT Visit: 1 Visit OT Treatments $Self Care/Home Management : 38-52 mins  Jackquline Denmark, MS, OTR/L , CBIS ascom [phone]  01/31/21, 3:51 PM

## 2021-02-01 DIAGNOSIS — R5381 Other malaise: Secondary | ICD-10-CM

## 2021-02-01 LAB — CBC
HCT: 32 % — ABNORMAL LOW (ref 39.0–52.0)
Hemoglobin: 9.9 g/dL — ABNORMAL LOW (ref 13.0–17.0)
MCH: 24.5 pg — ABNORMAL LOW (ref 26.0–34.0)
MCHC: 30.9 g/dL (ref 30.0–36.0)
MCV: 79.2 fL — ABNORMAL LOW (ref 80.0–100.0)
Platelets: 337 10*3/uL (ref 150–400)
RBC: 4.04 MIL/uL — ABNORMAL LOW (ref 4.22–5.81)
RDW: 14.9 % (ref 11.5–15.5)
WBC: 12.7 10*3/uL — ABNORMAL HIGH (ref 4.0–10.5)
nRBC: 0 % (ref 0.0–0.2)

## 2021-02-01 LAB — GLUCOSE, CAPILLARY
Glucose-Capillary: 127 mg/dL — ABNORMAL HIGH (ref 70–99)
Glucose-Capillary: 180 mg/dL — ABNORMAL HIGH (ref 70–99)
Glucose-Capillary: 173 mg/dL — ABNORMAL HIGH (ref 70–99)
Glucose-Capillary: 183 mg/dL — ABNORMAL HIGH (ref 70–99)
Glucose-Capillary: 147 mg/dL — ABNORMAL HIGH (ref 70–99)

## 2021-02-01 NOTE — Progress Notes (Signed)
PROGRESS NOTE  Jeremy Browning RSW:[id number] DOB: Sep 30, 1960 DOA: 12/25/2020 PCP: No primary care provider on file.   LOS: 38 days   Brief Narrative / Interim history: 60 year old male with obesity, DM2, HTN, combined CHF who was admitted on 12/25/2020 with respiratory distress.  He was intubated in the ER also had VT requiring epi/cardioversion.  Hospital course complicated by persistent vent dependent respiratory failure eventually requiring tracheostomy, anoxic encephalopathy.  Significant events 5/7 severe resp failure 5/9 multiorgan failure MRI shows brain injury 5/12 failed weaning trials due to severe brain damage  5/17 no purposeful movement on wake up assessment, becomes tachypneic, asynchronous with vent 5/18 remains critically ill, severe brain damage, patient made DNR 5/20 family decided on TRACH/PEG tube 5/25 [facility] PLACED by ENT 5/26 s/p trach PEG tube 01/20/21 tolerates Trach collar @ 28% FiO2 01/25/21: declined for LTAC.  Patient is becoming little more responsive 01/30/21 mental status better, starting to talk with valve  Subjective / 24h Interval events: Talking a little bit today, is appropriate but sometimes does not answer questions  Assessment & Plan: Principal Problem Acute hypoxic respiratory failure in the setting of cardiac arrest, pulmonary edema, Proteus pneumonia -treated while in the ICU, completed a course of antibiotics with ceftriaxone and Unasyn.  Has not required them for the past week, tolerating trach collar -Continue aggressive pulmonary toilet, continue bronchodilators -Respiratory status is stable  Active Problems Cardiac arrest / V. Tach / non-STEMI / acute combined systolic and diastolic CHF /cardiogenic shock-requiring pressors while in the ICU, currently resolved.  Cardiology consulted and underwent a cardiac cath on 5/7 which showed normal coronaries.  A 2D echo done on 5/9 showed an EF of 45-50% with global hypokinesis of the left ventricle.  It  also showed grade 1 diastolic dysfunction.  Appears euvolemic  Elevated LFTs -noticed on 6/11, overall trending down.  Hepatitis panel negative, right upper quadrant ultrasound negative.  Discontinued Tylenol and statin  Anoxic brain encephalopathy, CVAs-high suspicion based on the MRI, clinically stable currently.  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  Neurology consulted and evaluated patient while hospitalized.  His left side is flaccid.  Mental status seems to be improving, he is more interactive, and has been talking more for the last couple of days  Acute kidney injury-resolved, baseline creatinine seems to be around 1.1-1.2  Anemia of chronic disease-hemoglobin stable, no bleeding  Scheduled Meds:  aspirin  81 mg Per Tube Daily   carvedilol  12.5 mg Per Tube BID WC   chlorhexidine gluconate (MEDLINE KIT)  15 mL Mouth Rinse BID   Chlorhexidine Gluconate Cloth  6 each Topical Q0600   docusate  100 mg Per Tube BID   enoxaparin (LOVENOX) injection  0.5 mg/kg Subcutaneous Q24H   famotidine  20 mg Per Tube Daily   feeding supplement (PROSource TF)  45 mL Per Tube BID   free water  200 mL Per Tube Q6H   hydrALAZINE  25 mg Per Tube Q6H   insulin aspart  0-15 Units Subcutaneous Q4H   mouth rinse  15 mL Mouth Rinse 10 times per day   polyethylene glycol  17 g Per Tube Daily   Continuous Infusions:  feeding supplement (NEPRO CARB STEADY) 1,000 mL (01/31/21 2345)   PRN Meds:.albuterol, docusate, fentaNYL (SUBLIMAZE) injection, hydrALAZINE, oxyCODONE, polyethylene glycol, polyvinyl alcohol, sodium chloride flush, sodium chloride flush  Diet Orders (From admission, onward)  Start     Ordered   01/10/21 1632  Diet NPO time specified  Diet effective now        01/10/21 1631            DVT prophylaxis:      Code Status: DNR  Family Communication: No  family at bedside this morning  Status is: Inpatient  Remains inpatient appropriate because:Inpatient level of care appropriate due to severity of illness   Dispo: The patient is from: Home              Anticipated d/c is to: SNF              Patient currently is not medically stable to d/c.   Difficult to place patient Yes   Level of care: Progressive Cardiac  Consultants:  Critical care GI Cardiology ENT Palliative care Neurology  Procedures:  2D echo Cardiac cath Trach PEG  Microbiology  none  Antimicrobials: none    Objective: Vitals:   02/01/21 0422 02/01/21 0620 02/01/21 0739 02/01/21 0810  BP: (!) 162/75  (!) 156/60 (!) 158/59  Pulse: 80  86 87  Resp: '16  19 17  ' Temp: 98.8 F (37.1 C)  98.9 F (37.2 C) 98 F (36.7 C)  TempSrc:   Axillary   SpO2: 99%  98% 100%  Weight:  115.2 kg    Height:        Intake/Output Summary (Last 24 hours) at 02/01/2021 1013 Last data filed at 02/01/2021 0935 Gross per 24 hour  Intake 0 ml  Output 2301 ml  Net -2301 ml    Filed Weights   01/30/21 0500 01/31/21 0418 02/01/21 0620  Weight: 116.3 kg 115.7 kg 115.2 kg    Examination:  Constitutional: Is in no distress Eyes: No scleral icterus ENMT: Moist mucous membranes Neck: normal, supple Respiratory: Diminished at the bases but overall clear without wheezing Cardiovascular: Regular rate and rhythm, no murmurs, no peripheral edema Abdomen: Soft, nontender, nondistended, bowel sounds positive, PEG tube in place Musculoskeletal: no clubbing / cyanosis.  Skin: No rashes appreciated Neurologic: Moves only the right side  Data Reviewed: I have independently reviewed following labs and imaging studies   CBC: Recent Labs  Lab 01/26/21 0448 01/29/21 0500  WBC 10.5 11.0*  HGB 9.2* 10.4*  HCT 28.7* 33.0*  MCV 78.4* 77.5*  PLT 257 419    Basic Metabolic Panel: Recent Labs  Lab 01/26/21 0448 01/29/21 0500 01/30/21 1310 01/31/21 0525  NA 136 139 139  141  K 4.0 4.2 4.3 4.6  CL 108 108 108 109  CO2 '23 26 26 27  ' GLUCOSE 152* 173* 168* 123*  BUN 43* 42* 43* 39*  CREATININE 1.25* 1.24 1.16 1.25*  CALCIUM 8.3* 8.5* 8.6* 8.7*    Liver Function Tests: Recent Labs  Lab 01/29/21 0500 01/30/21 1310 01/31/21 0525  AST 119* 157* 114*  ALT 219* 245* 217*  ALKPHOS 121 122 126  BILITOT 0.4 0.4 0.5  PROT 7.2 7.3 7.7  ALBUMIN 2.1* 2.1* 2.2*    Coagulation Profile: No results for input(s): INR, PROTIME in the last 168 hours. HbA1C: No results for input(s): HGBA1C in the last 72 hours. CBG: Recent Labs  Lab 01/31/21 1644 01/31/21 2020 01/31/21 2315 02/01/21 0421 02/01/21 0811  GLUCAP 153* 184* 158* 127* 180*     No results found for this or any previous visit (from the past 240 hour(s)).   Radiology Studies: No results found.   Marzetta Board, MD, PhD [facility]  Between 7 am - 7 pm I am available, please contact me via Amion (for emergencies) or Securechat (non urgent messages)  Between 7 pm - 7 am I am not available, please contact night coverage MD/APP via Amion

## 2021-02-01 NOTE — Progress Notes (Signed)
Patient given bath and up to chair foley removed and condom cath applied, need two plus with hoyer lift

## 2021-02-01 NOTE — Consult Note (Signed)
Physical Medicine and Rehabilitation Consult Follow-Up Reason for Consult: Anoxic TBI Referring Physician: Arnetha Courser, MD   HPI: Jeremy Browning is a 60 y.o. male with obesity, DM, HTN, and CHF who presented to the ED in respiratory distress and required intubation. CCB Vtach and anoxic brain injury. Physical Medicine & Rehabilitation was consulted to assess candidacy for CIR. Patient is currently lethargic. Currently unable to transfer. Received notification from PT and OT that patient is demonstrating significant improvements, and for request for re-evaluation.    ROS: Unable to obtain due to cognitive deficits Past Medical History:  Diagnosis Date   Diabetes mellitus type II, non insulin dependent (HCC)    Essential hypertension Jeremy Browning   Heart failure (HCC)    Unknown details.  By report no cardiac surgery   Venous stasis dermatitis of left lower extremity    Past Surgical History:  Procedure Laterality Date   CARDIAC CATHETERIZATION     LEFT HEART CATH AND CORONARY ANGIOGRAPHY N/A 12/25/2020   Procedure: LEFT HEART CATH AND CORONARY ANGIOGRAPHY;  Surgeon: Jeremy Lex, MD;  Location: [facility] INVASIVE CV LAB;  Service: Cardiovascular;  Laterality: N/A;   PEG PLACEMENT N/A 01/11/2021   Procedure: PERCUTANEOUS ENDOSCOPIC GASTROSTOMY (PEG) PLACEMENT;  Surgeon: Jeremy Minium, MD;  Location: [facility] ENDOSCOPY;  Service: Endoscopy;  Laterality: N/A;   RIGHT HEART CATH N/A 12/25/2020   Procedure: RIGHT HEART CATH;  Surgeon: Jeremy Lex, MD;  Location: [facility] INVASIVE CV LAB;  Service: Cardiovascular;  Laterality: N/A;   TRACHEOSTOMY TUBE PLACEMENT N/A 01/12/2021   Procedure: TRACHEOSTOMY;  Surgeon: Jeremy Face, MD;  Location: [facility] ORS;  Service: ENT;  Laterality: N/A;   Unknown     Family History  Family history unknown: Yes   Social History:  has no history on file for tobacco use, alcohol use, and drug use. Allergies: Not on File Medications Prior to Admission   Medication Sig Dispense Refill   aspirin EC 81 MG tablet Take 81 mg by mouth daily. Swallow whole.     atorvastatin (LIPITOR) 80 MG tablet Take 80 mg by mouth daily.     carvedilol (COREG) 12.5 MG tablet Take 12.5 mg by mouth 2 (two) times daily with a meal.     furosemide (LASIX) 80 MG tablet Take 80 mg by mouth daily.     losartan (COZAAR) 50 MG tablet Take 50 mg by mouth daily.      Home: Home Living Available Help at Discharge:  (unknown) Type of Home: House Additional Comments: pt unable to answer questions at this time, no family available. Will attempt to f/u with family as able.  Lives With:  (unknown)  Functional History:   Functional Status:  Mobility: Bed Mobility Overal bed mobility: Needs Assistance Bed Mobility: Rolling Rolling: Max assist, Total assist Supine to sit: Total assist, +2 for physical assistance Sit to supine: Total assist, +2 for physical assistance General bed mobility comments: pt reaching UE during trunk elevation/reclining to bed Transfers General transfer comment: not attempted focused on upright posture, sitting, endurance      ADL: ADL Overall ADL's : Needs assistance/impaired Grooming: Wash/dry hands, Wash/dry Browning, Oral care Grooming Details (indicate cue type and reason): total hand over hand assist but following commands for oral care this session General ADL Comments: TOTAL A don/doff B splints and prevalon boots at bed level. TOTAL A x2 for toileting at bed level  Cognition: Cognition Overall Cognitive Status: Impaired/Different from baseline Arousal/Alertness: Awake/alert (nonverbal; inconsistent follow through)  Orientation Level: Oriented to person, Oriented to place Awareness: Impaired Safety/Judgment: Impaired Rancho Mirant Scales of Cognitive Functioning: Localized response Cognition Arousal/Alertness: Awake/alert Behavior During Therapy: WFL for tasks assessed/performed, Flat affect Overall Cognitive Status:  Impaired/Different from baseline Area of Impairment: Rancho level General Comments: increased phonations  Blood pressure (!) 145/61, pulse 75, temperature 98.6 F (37 C), temperature source Axillary, resp. rate 20, height 6' (1.829 m), weight 115.2 kg, SpO2 100 %.  Gen: no distress, normal appearing, obese BMI 34.45 HEENT: oral mucosa pink and moist, NCAT Cardio: Reg rate Chest: normal effort, normal rate of breathing Abd: soft, non-distended Ext: no edema Psych: pleasant, normal affect Skin: intact Neuro: Alert and oriented x2. Dysarthric. Poor awareness of deficits.  Musculoskeletal: Unable to follow MMT commands with the exception of raising right arm  Results for orders placed or performed during the hospital encounter of 12/25/20 (from the past 24 hour(s))  Glucose, capillary     Status: Abnormal   Collection Time: 01/31/21  4:44 PM  Result Value Ref Range   Glucose-Capillary 153 (H) 70 - 99 mg/dL  Glucose, capillary     Status: Abnormal   Collection Time: 01/31/21  8:20 PM  Result Value Ref Range   Glucose-Capillary 184 (H) 70 - 99 mg/dL  Glucose, capillary     Status: Abnormal   Collection Time: 01/31/21 11:15 PM  Result Value Ref Range   Glucose-Capillary 158 (H) 70 - 99 mg/dL  Glucose, capillary     Status: Abnormal   Collection Time: 02/01/21  4:21 AM  Result Value Ref Range   Glucose-Capillary 127 (H) 70 - 99 mg/dL  Glucose, capillary     Status: Abnormal   Collection Time: 02/01/21  8:11 AM  Result Value Ref Range   Glucose-Capillary 180 (H) 70 - 99 mg/dL  CBC     Status: Abnormal   Collection Time: 02/01/21 10:00 AM  Result Value Ref Range   WBC 12.7 (H) 4.0 - 10.5 K/uL   RBC 4.04 (L) 4.22 - 5.81 MIL/uL   Hemoglobin 9.9 (L) 13.0 - 17.0 g/dL   HCT 70.6 (L) 23.7 - 62.8 %   MCV 79.2 (L) 80.0 - 100.0 fL   MCH 24.5 (L) 26.0 - 34.0 pg   MCHC 30.9 30.0 - 36.0 g/dL   RDW 31.5 17.6 - 16.0 %   Platelets 337 150 - 400 K/uL   nRBC 0.0 0.0 - 0.2 %  Glucose,  capillary     Status: Abnormal   Collection Time: 02/01/21 11:46 AM  Result Value Ref Range   Glucose-Capillary 173 (H) 70 - 99 mg/dL   US Abdomen Limited RUQ (LIVER/GB)  Result Date: 01/31/2021 CLINICAL DATA:  Elevated liver function tests EXAM: ULTRASOUND ABDOMEN LIMITED RIGHT UPPER QUADRANT COMPARISON:  None. FINDINGS: Gallbladder: No gallstones or wall thickening visualized. No sonographic Murphy sign noted by sonographer. Common bile duct: Diameter: 5 mm Liver: No focal lesion identified. Within normal limits in parenchymal echogenicity. Portal vein is patent on color Doppler imaging with normal direction of blood flow towards the liver. Other: Simple right upper pole cyst measuring 2.7 cm maximal IMPRESSION: Negative right upper quadrant ultrasound. Electronically Signed   By: Marnee Spring M.D.   On: 01/31/2021 09:28    Assessment/Plan: 1) Anoxic brain injury: -Patient has made excellent progress! Unfortunately he is currently still not at appropriate level for CIR due to severity of his anoxic brain injury. Unable to attempt transfers with PT today. Discussed with patient (limited comprehension) and  RN at bedside criteria for CIR admission. We will continue to follow as patient progresses in hopes that he will eventually be able to tolerate CIR. Thank you for this consult and your excellent care.   Horton Chin, MD 02/01/2021

## 2021-02-01 NOTE — Progress Notes (Signed)
Nutrition Follow Up Note   DOCUMENTATION CODES:   Obesity unspecified  INTERVENTION:   Continue Nepro 1.8 '@60ml' /hr + Pro-Source 61m BID via tube  Continue Free water flushes to 2037mq4 hours   Regimen provides 2672kcal/day, 139g/day protein and 224769may free water   NUTRITION DIAGNOSIS:   Inadequate oral intake related to inability to eat (pt sedated and ventilated) as evidenced by NPO status.  GOAL:   Patient will meet greater than or equal to 90% of their needs  -met with tube feeds   MONITOR:   Labs, Weight trends, TF tolerance, Skin, I & O's  ASSESSMENT:   [age]o. black male with diabetes mellitus type II, hypertension and congestive heart failure who [facility] for cardiogenic shock and flash pulmonary edema  Pt s/p PEG tube 5/24  Pt s/p tracheostomy 5/25  Pt using Passy-Muir valve to speak. Pt tolerating tube feeds well at goal rate. Refeed labs stable. Sodium normalized. Per chart, pt has remained weight stable since admit. No wounds present. Pt currently pending placement.   Medications reviewed and include: aspirin, colace, lovenox, pepcid, insulin, miralax  Labs reviewed: Na 141 wnl, K 4.6 wnl, BUN 39(H), creat 1.25(H) Wbc- 12.7(H), Hgb 9.9(L), Hct 32.0(L), MCV 79.2(L), MCH 24.5(L) cbgs- 127, 180, 173 x 24 hrs  Diet Order:   Diet Order             Diet NPO time specified  Diet effective now                  EDUCATION NEEDS:   No education needs have been identified at this time  Skin:  Skin Assessment: Reviewed RN Assessment: incision neck s/p trach   Last BM:  6/13- type 6  Height:   Ht Readings from Last 1 Encounters:  01/25/21 6' (1.829 m)    Weight:   Wt Readings from Last 1 Encounters:  02/01/21 115.2 kg    BMI:  Body mass index is 34.45 kg/m.  Estimated Nutritional Needs:   Kcal:  2500-2800kcal/day  Protein:  125-140g/day  Fluid:  2.4-2.7L/day  CasKoleen Distance, RD, LDN Please refer to  [facility] and/or RD on-call/weekend/after hours pager

## 2021-02-02 ENCOUNTER — Inpatient Hospital Stay: Payer: Medicaid Other

## 2021-02-02 DIAGNOSIS — R1312 Dysphagia, oropharyngeal phase: Secondary | ICD-10-CM | POA: Diagnosis not present

## 2021-02-02 DIAGNOSIS — J810 Acute pulmonary edema: Secondary | ICD-10-CM | POA: Diagnosis not present

## 2021-02-02 DIAGNOSIS — I469 Cardiac arrest, cause unspecified: Secondary | ICD-10-CM | POA: Diagnosis not present

## 2021-02-02 DIAGNOSIS — N179 Acute kidney failure, unspecified: Secondary | ICD-10-CM | POA: Diagnosis not present

## 2021-02-02 LAB — COMPREHENSIVE METABOLIC PANEL
ALT: 199 U/L — ABNORMAL HIGH (ref 0–44)
AST: 78 U/L — ABNORMAL HIGH (ref 15–41)
Albumin: 2.4 g/dL — ABNORMAL LOW (ref 3.5–5.0)
Alkaline Phosphatase: 125 U/L (ref 38–126)
Anion gap: 6 (ref 5–15)
BUN: 57 mg/dL — ABNORMAL HIGH (ref 8–23)
CO2: 24 mmol/L (ref 22–32)
Calcium: 8.9 mg/dL (ref 8.9–10.3)
Chloride: 108 mmol/L (ref 98–111)
Creatinine, Ser: 1.37 mg/dL — ABNORMAL HIGH (ref 0.61–1.24)
GFR, Estimated: 59 mL/min — ABNORMAL LOW (ref >60)
Glucose, Bld: 184 mg/dL — ABNORMAL HIGH (ref 70–99)
Potassium: 4.1 mmol/L (ref 3.5–5.1)
Sodium: 138 mmol/L (ref 135–145)
Total Bilirubin: 0.4 mg/dL (ref 0.3–1.2)
Total Protein: 7.9 g/dL (ref 6.5–8.1)

## 2021-02-02 LAB — CBC
HCT: 31.2 % — ABNORMAL LOW (ref 39.0–52.0)
Hemoglobin: 9.7 g/dL — ABNORMAL LOW (ref 13.0–17.0)
MCH: 24.9 pg — ABNORMAL LOW (ref 26.0–34.0)
MCHC: 31.1 g/dL (ref 30.0–36.0)
MCV: 80 fL (ref 80.0–100.0)
Platelets: 326 10*3/uL (ref 150–400)
RBC: 3.9 MIL/uL — ABNORMAL LOW (ref 4.22–5.81)
RDW: 15.1 % (ref 11.5–15.5)
WBC: 13.5 10*3/uL — ABNORMAL HIGH (ref 4.0–10.5)
nRBC: 0 % (ref 0.0–0.2)

## 2021-02-02 LAB — GLUCOSE, CAPILLARY
Glucose-Capillary: 151 mg/dL — ABNORMAL HIGH (ref 70–99)
Glucose-Capillary: 156 mg/dL — ABNORMAL HIGH (ref 70–99)
Glucose-Capillary: 162 mg/dL — ABNORMAL HIGH (ref 70–99)
Glucose-Capillary: 163 mg/dL — ABNORMAL HIGH (ref 70–99)
Glucose-Capillary: 164 mg/dL — ABNORMAL HIGH (ref 70–99)
Glucose-Capillary: 165 mg/dL — ABNORMAL HIGH (ref 70–99)
Glucose-Capillary: 192 mg/dL — ABNORMAL HIGH (ref 70–99)

## 2021-02-02 IMAGING — US US EXTREM LOW VENOUS*L*
1 series · 14 of 24 positions shown · non-contrast
Comparison: None.

CLINICAL DATA: Left lower extremity edema

EXAM:
LEFT LOWER EXTREMITY VENOUS DOPPLER ULTRASOUND
TECHNIQUE: Gray-scale sonography with compression, as well as color and duplex
ultrasound, were performed to evaluate the deep venous system(s)
from the level of the common femoral vein through the popliteal and
proximal calf veins.

[Series 1: us venous img lower uni left (dvt) · portal-venous · 14 of 33 slices shown]
[im 1/33]
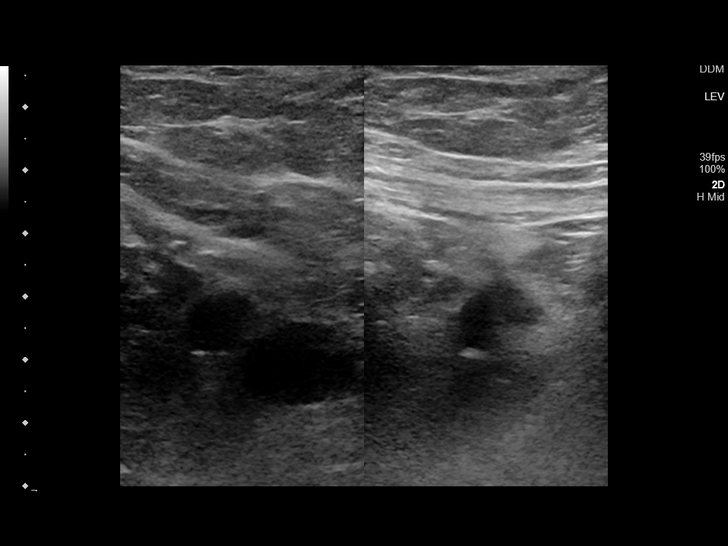
[im 3/33]
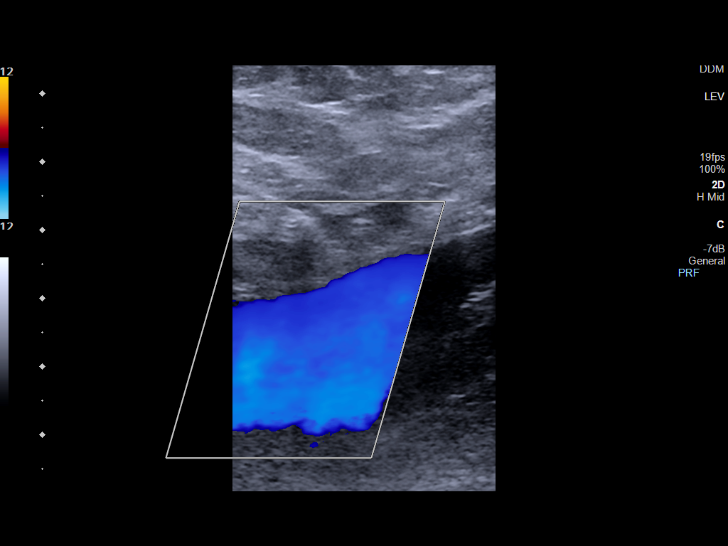
[im 6/33]
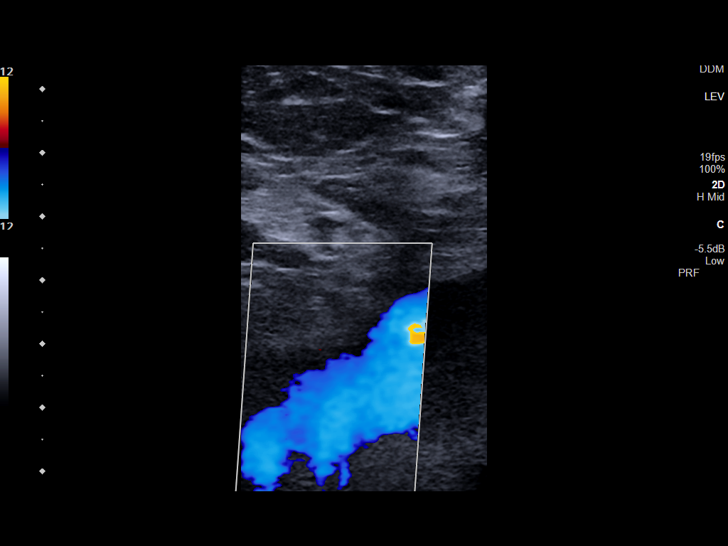
[im 9/33]
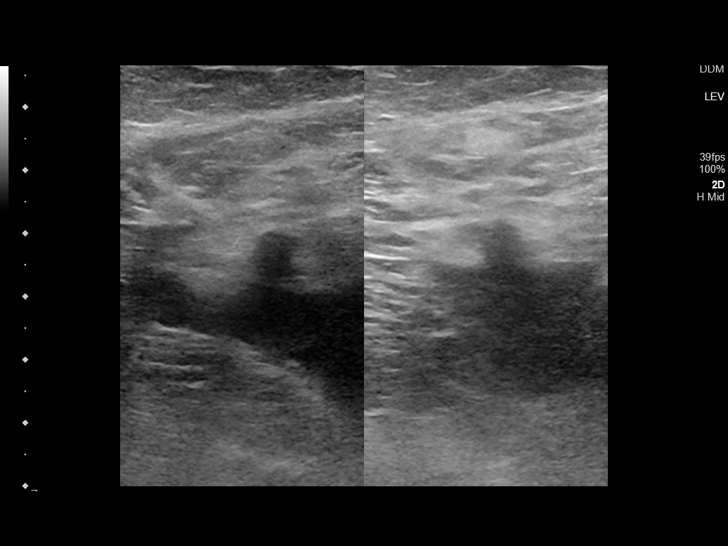
[im 10/33]
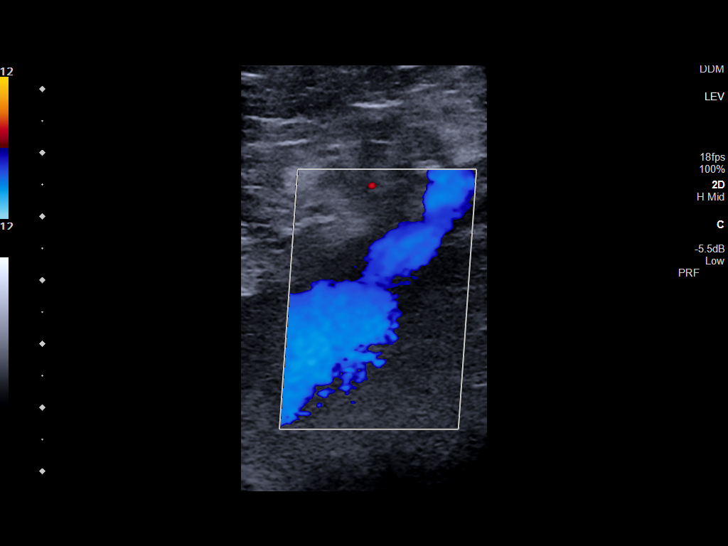
[im 13/33]
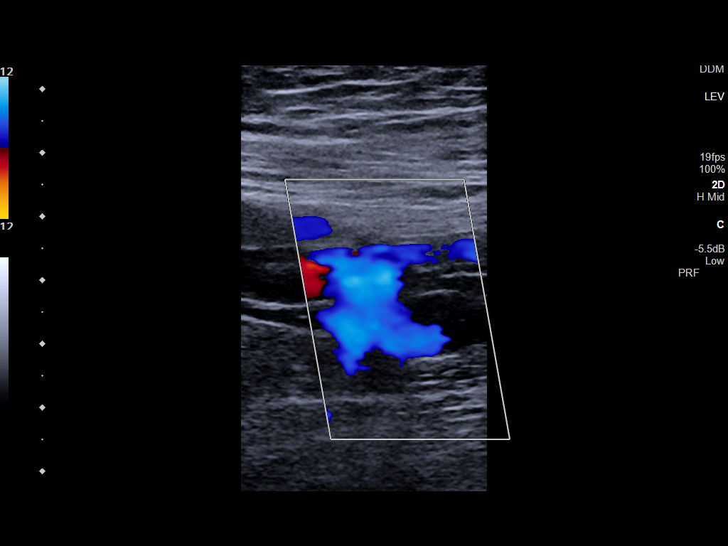
[im 16/33]
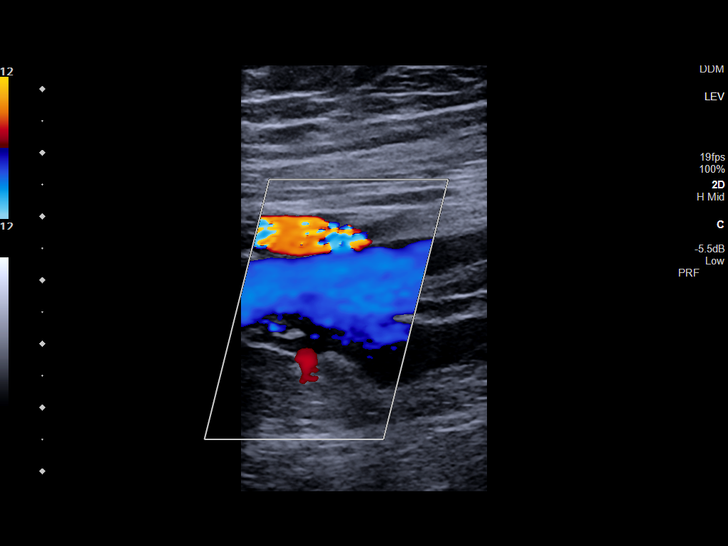
[im 17/33]
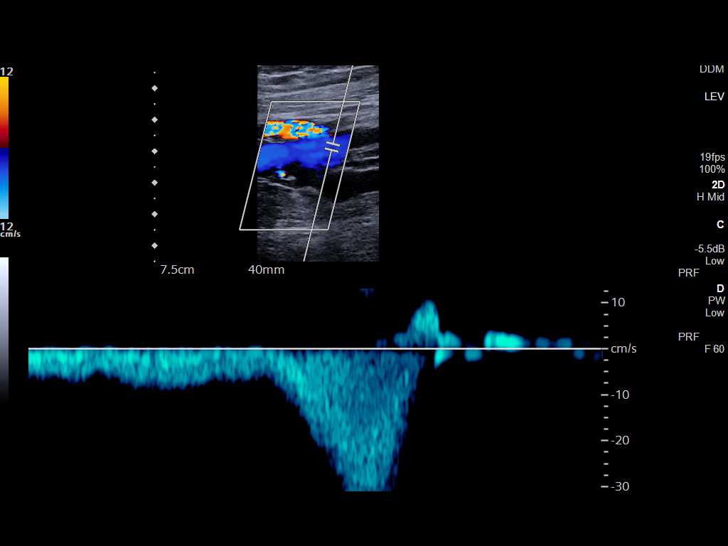
[im 20/33]
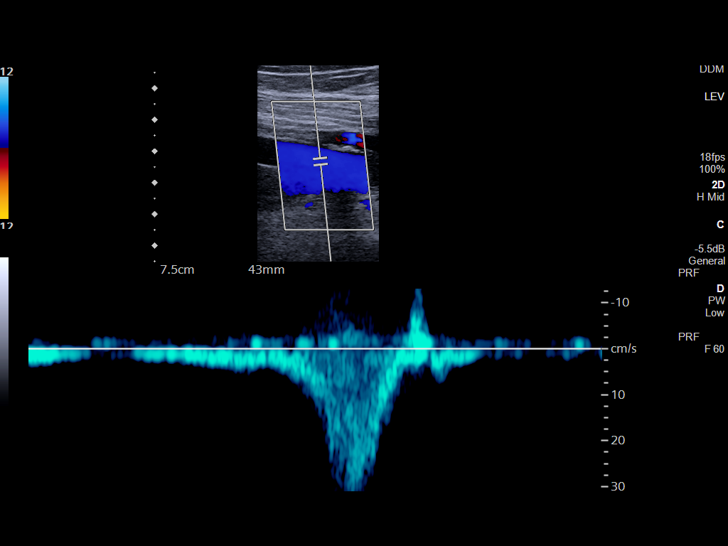
[im 23/33]
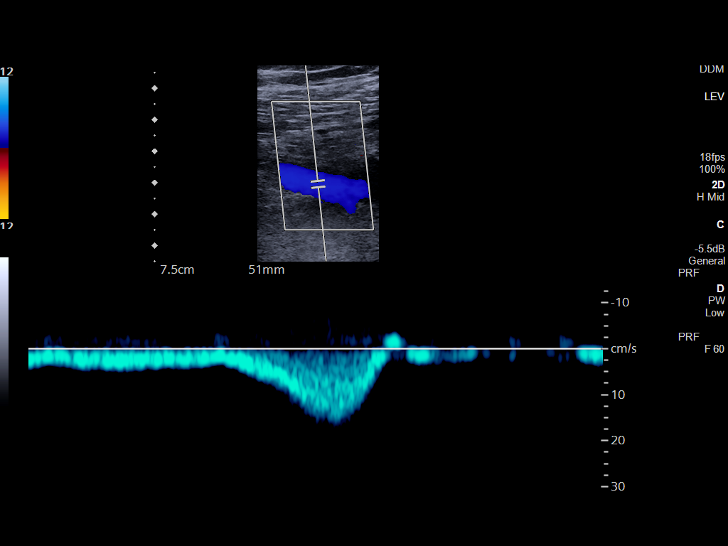
[im 26/33]
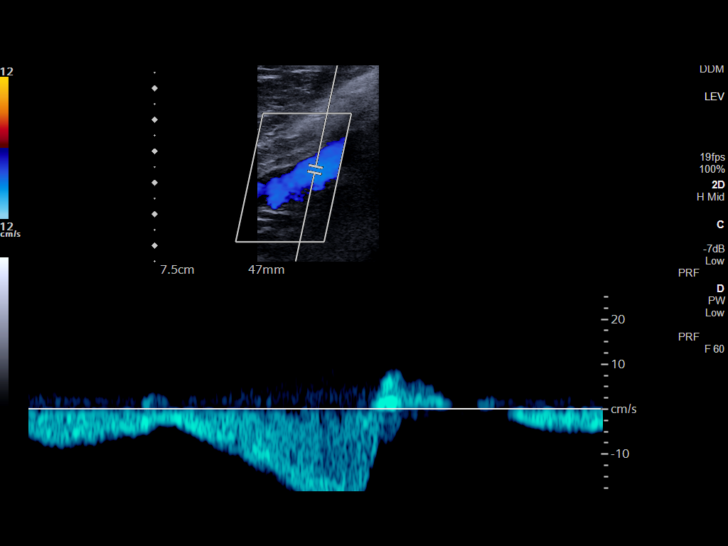
[im 27/33]
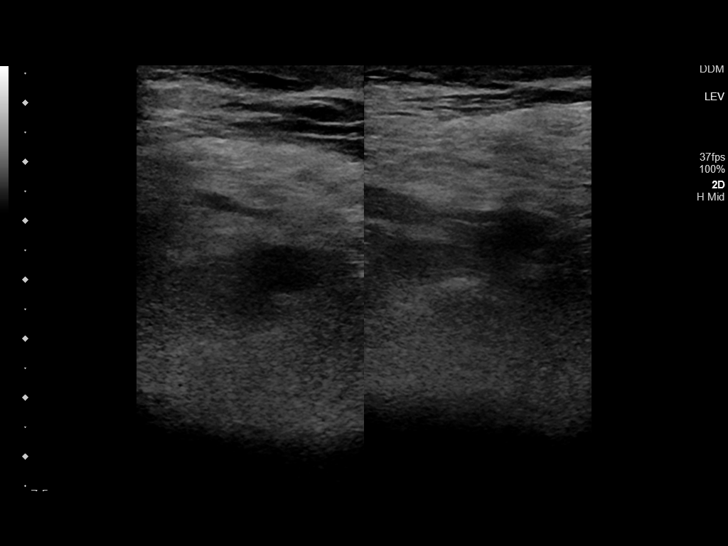
[im 30/33]
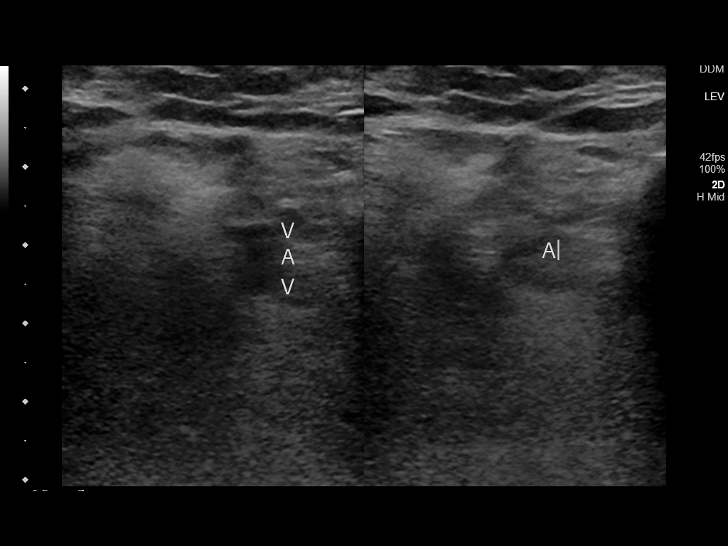
[im 33/33]
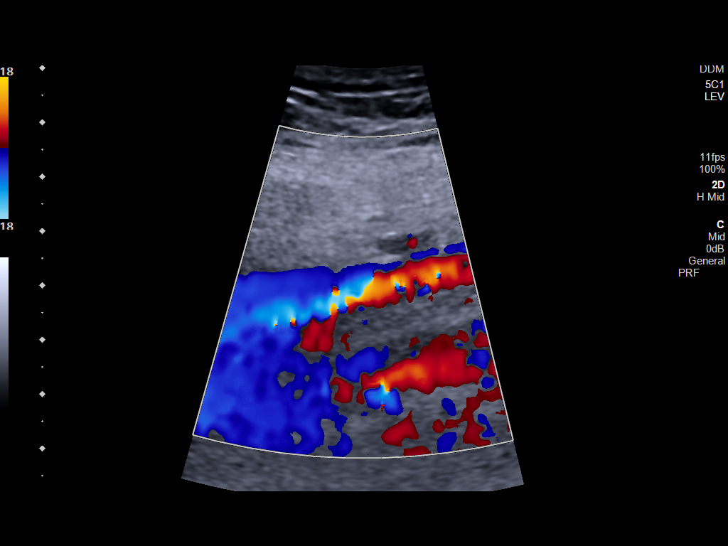

[14 of 24 positions shown; findings below may reference images not displayed]

FINDINGS: VENOUS

Normal compressibility of the common femoral, superficial femoral,
and popliteal veins, as well as the visualized calf veins.
Visualized portions of profunda femoral vein and great saphenous
vein unremarkable. No filling defects to suggest DVT on grayscale or
color Doppler imaging. Doppler waveforms show normal direction of
venous flow, normal respiratory plasticity and response to
augmentation.

Limited views of the contralateral common femoral vein are
unremarkable.

OTHER

None.

Limitations: none
IMPRESSION: Negative.

## 2021-02-02 IMAGING — DX DG CHEST 1V
1 series · 1 of 1 positions shown · non-contrast
Comparison: [DATE]

CLINICAL DATA: Central placement

EXAM:
CHEST  1 VIEW

[chest ap]
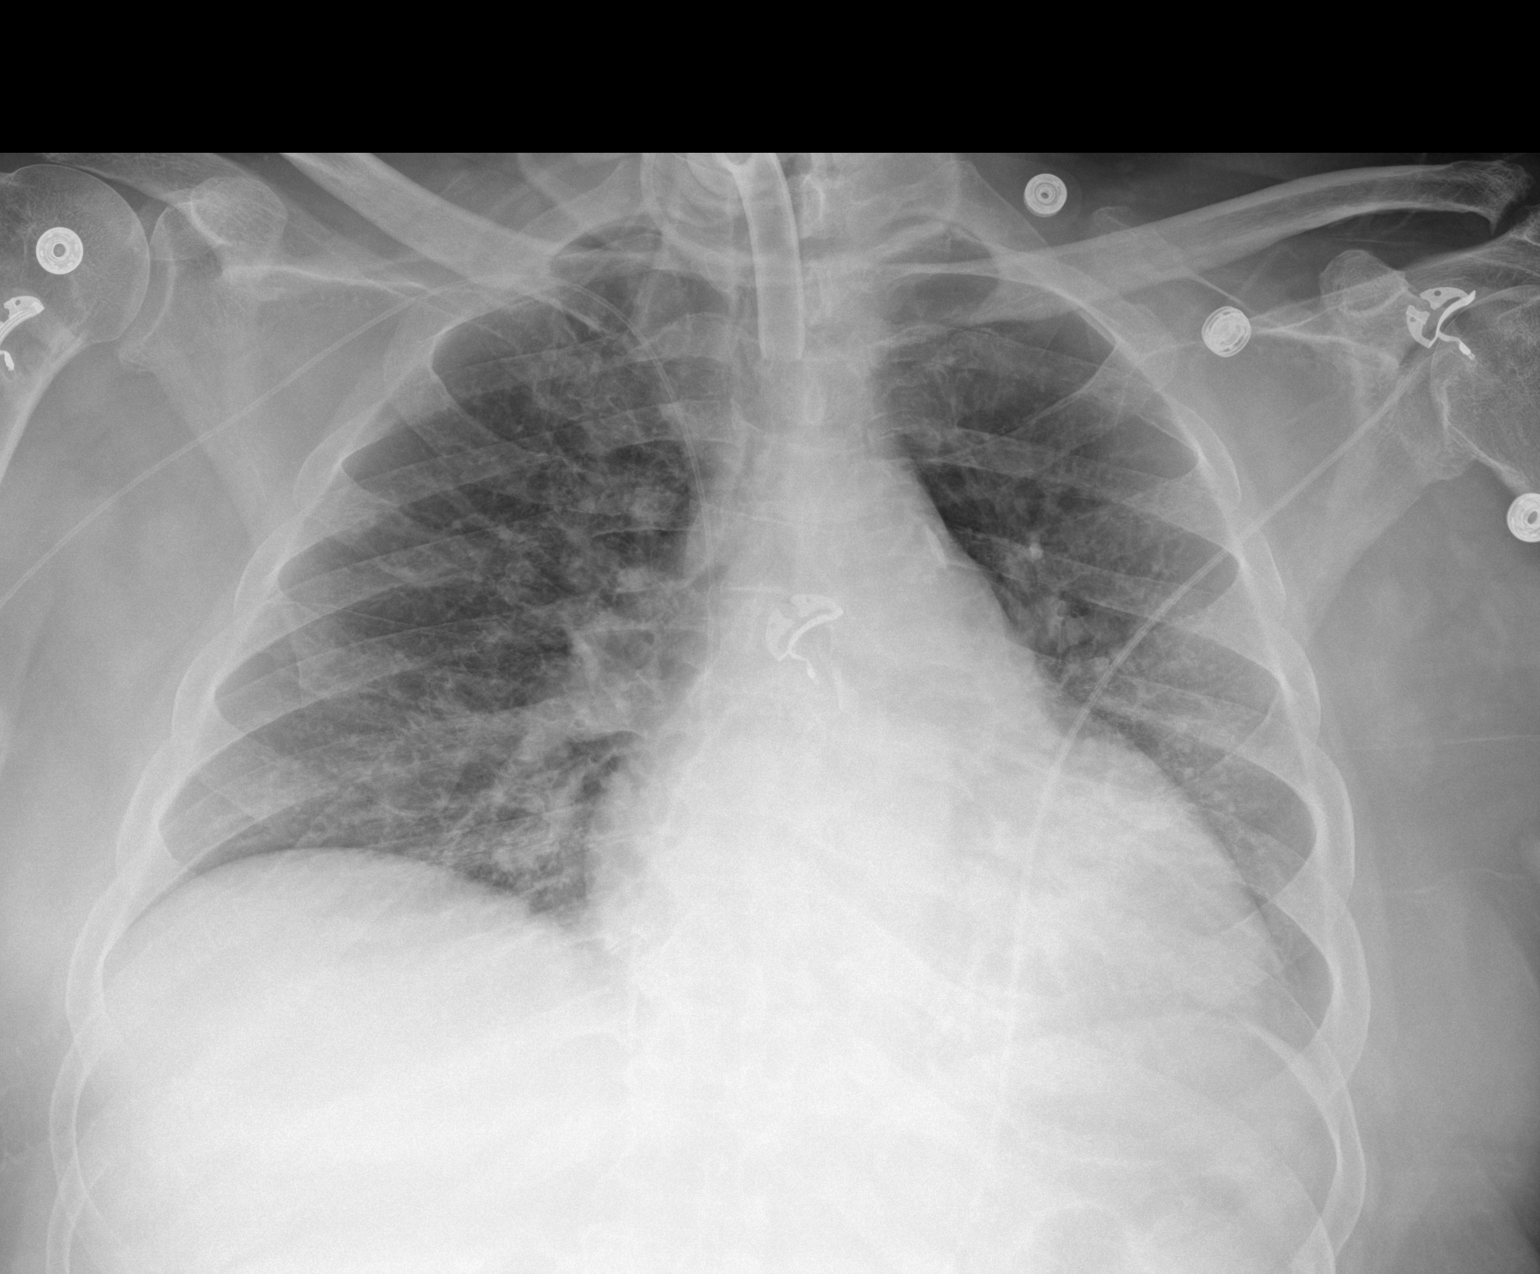

[1 of 1 positions shown; findings below may reference images not displayed]

FINDINGS: Central catheter tip in superior vena cava near cavoatrial junction.
Tracheostomy catheter tip is 5.9 cm above the carina. No
pneumothorax. There is no edema or airspace opacity. Heart is
borderline enlarged with pulmonary vascularity normal. No
adenopathy. No bone lesions.
IMPRESSION: Tube and catheter positions as described pneumothorax. Lungs clear.
Stable cardiac prominence.

## 2021-02-02 MED ORDER — ALTEPLASE 2 MG IJ SOLR
2.0000 mg | Freq: Once | INTRAMUSCULAR | Status: AC
  Administered 2021-02-02: 2 mg
  Filled 2021-02-02: qty 2

## 2021-02-02 NOTE — Progress Notes (Signed)
  Speech Language Pathology Treatment: Cognitive-Linquistic  Patient Details Name: Jeremy Browning MRN: [id number] DOB: [birth year] Today's Date: 02/02/2021 Time: 4098-1191 SLP Time Calculation (min) (ACUTE ONLY): 15 min  Assessment / Plan / Recommendation Clinical Impression  Pt presents with behaviors characteristic of Rancho Level III. Pt is alert, able to state his name and states that he is in the "hospital." While he states that he is "thirsty," he does not respond to ice chip, liquid or spoon at his lips. With maximal cues and slight pressure on his lips, he is able to open his mouth slightly. When consuming initial ice chip, pt demonstrated brief attention to it as he had an oropharyngeal response. Unfortunately, he was inattentive when given second trial. Pt's speech is imprecise d/t decreased awareness and is ~ 25% intelligible at the phrase level. He also demonstrates confabulation as he spoke of someone present in his room. All of the above is marked improvement over previous session.     HPI HPI: Pt is a 60 yo male that presented from home with respiratory distress 12/25/2020. Underwent defibrillation and CPR, emergently intubated. During admission pt has had a cardiac cath, peg tube and tracheostomy placed, weaned from vent 01/13/2021. 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 Mildly restricted diffusion involving the cortex more diffusely over the right cerebral convexity favored to reflect an acute MCA territory infarct over global hypoxic injury given unilaterality".  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 No further acute events and pt continues to tolerate Trach collar @ 28% FiO2. #8 Shiley trach, cuffed.      SLP Plan  Continue with current plan of care       Recommendations         Patient  may use Passy-Muir Speech Valve: During all waking hours (remove during sleep) PMSV Supervision: Intermittent MD: Please consider changing trach tube to :  (capping trach)         Oral Care Recommendations: Oral care QID Follow up Recommendations: Inpatient Rehab SLP Visit Diagnosis: Cognitive communication deficit (R41.841) Plan: Continue with current plan of care       GO              Everly Rubalcava B. Dreama Saa M.S., CCC-SLP, [facility] Speech-Language Pathologist Rehabilitation Services Office [phone]   [name] 02/02/2021, 4:54 PM

## 2021-02-02 NOTE — Progress Notes (Signed)
PT Cancellation Note  Patient Details Name: Jeremy Browning MRN: [id number] DOB: Jun 11, 1961   Cancelled Treatment:    Reason Eval/Treat Not Completed: Other (comment). PT expressed concerns of LLE swelling to RN and MD. PT to re-attempt as able pending medical appropriateness.   Olga Coaster PT, DPT 11:07 AM,02/02/21

## 2021-02-02 NOTE — Progress Notes (Signed)
[facility]  - [place] [facility]   PATIENT NAME: Jeremy Browning    MR#:  [id number]  DATE OF BIRTH:  Apr 20, 1961  SUBJECTIVE:  patient awake during most of my conversation. Trying to give answers how were is not clear as to what he says. No family in the room  REVIEW OF SYSTEMS:   Review of Systems  Unable to perform ROS: Medical condition  Tolerating Diet:NPO--  Tolerating PT: CIR  DRUG ALLERGIES:  Not on File  VITALS:  Blood pressure (!) 153/54, pulse 81, temperature 98.7 F (37.1 C), resp. rate 18, height 6' (1.829 m), weight 112 kg, SpO2 100 %.  PHYSICAL EXAMINATION:   Physical Exam  GENERAL:  60 y.o.-year-old patient lying in the bed with no acute distress.  Appears chrnically ill HEENT: Head atraumatic, normocephalic. Poor dentition  NECK:  Supple, no jugular venous distention. No thyroid enlargement, no tenderness. Trach collar + LUNGS: Normal breath sounds bilaterally, no wheezing, rales, rhonchi. No use of accessory muscles of respiration.  CARDIOVASCULAR: S1, S2 normal. No murmurs, rubs, or gallops.  ABDOMEN: Soft, nontender, nondistended. Bowel sounds present. No organomegaly or mass. PEG + EXTREMITIES: No cyanosis, clubbing or edema b/l.    NEUROLOGIC: able to assess  PSYCHIATRIC:  patient is alert + SKIN:  per RN LABORATORY PANEL:  CBC Recent Labs  Lab 02/02/21 1245  WBC 13.5*  HGB 9.7*  HCT 31.2*  PLT 326    Chemistries  Recent Labs  Lab 02/02/21 1245  NA 138  K 4.1  CL 108  CO2 24  GLUCOSE 184*  BUN 57*  CREATININE 1.37*  CALCIUM 8.9  AST 78*  ALT 199*  ALKPHOS 125  BILITOT 0.4   Cardiac Enzymes No results for input(s): TROPONINI in the last 168 hours. RADIOLOGY:  DG Chest 1 View  Result Date: 02/02/2021 CLINICAL DATA:  Central placement EXAM: CHEST  1 VIEW COMPARISON:  Jan 07, 2021 FINDINGS: Central catheter tip in superior vena cava near cavoatrial junction. Tracheostomy catheter tip is 5.9 cm above the carina.  No pneumothorax. There is no edema or airspace opacity. Heart is borderline enlarged with pulmonary vascularity normal. No adenopathy. No bone lesions. IMPRESSION: Tube and catheter positions as described pneumothorax. Lungs clear. Stable cardiac prominence. Electronically Signed   By: Bretta Bang III M.D.   On: 02/02/2021 12:12   ASSESSMENT AND PLAN:   Acute hypoxic respiratory failure in the setting of cardiac arrest, pulmonary edema, Proteus pneumonia -treated while in the ICU, completed a course of antibiotics with ceftriaxone and Unasyn.  Has not required them for the past week, tolerating trach collar -Continue aggressive pulmonary toilet, continue bronchodilators -Respiratory status is stable  Cardiac arrest / V. Tach / non-STEMI / acute combined systolic and diastolic CHF /cardiogenic shock --requiring pressors while in the ICU, currently resolved. -- Cardiology consulted and underwent a cardiac cath on 5/7 which showed normal coronaries.  -- A 2D echo done on 5/9 showed an EF of 45-50% with global hypokinesis of the left ventricle.  It also showed grade 1 diastolic dysfunction.  Appears euvolemic   Elevated LFTs -noticed on 6/11, overall trending down.   --Hepatitis panel negative, right upper quadrant ultrasound negative.  --Discontinued Tylenol and statin   Anoxic brain encephalopathy, CVAs-high suspicion based on the MRI, clinically stable currently.   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  Neurology consulted and  evaluated patient while hospitalized.  His left side is flaccid.  Mental status seems to be improving, he is more interactive, and has been talking more for the last couple of days   Acute kidney injury-resolved, baseline creatinine seems to be around 1.1-1.2   Anemia of chronic disease-hemoglobin stable, no bleeding  Nutrition--cont PEG  feeding --6/15-- patient did not tolerate swallow study/evaluation by speech therapy  Procedures: Family communication :none otday Consults : CODE STATUS: DNR DVT Prophylaxis :enoxaparin Level of care: Progressive Cardiac Status is: Inpatient  Remains inpatient appropriate because:Unsafe d/c plan  Dispo: The patient is from: Home              Anticipated d/c is to: TBD CIR vs snf              Patient currently is medically stable to d/c.   Difficult to place patient Yes        TOTAL TIME TAKING CARE OF THIS PATIENT: 25 minutes.  >50% time spent on counselling and coordination of care  Note: This dictation was prepared with Dragon dictation along with smaller phrase technology. Any transcriptional errors that result from this process are unintentional.  Enedina Finner M.D    [facility]   CC: Primary care physician; No primary care provider on file. Patient ID: Jeremy Browning, male   DOB: 1961-04-11, 60 y.o.   MRN: [id number]

## 2021-02-02 NOTE — Evaluation (Signed)
Clinical/Bedside Swallow Evaluation Patient Details  Name: Jeremy Browning MRN: [id number] Date of Birth: 1961/06/20  Today's Date: 02/02/2021 Time: SLP Start Time (ACUTE ONLY): 1425 SLP Stop Time (ACUTE ONLY): 1440 SLP Time Calculation (min) (ACUTE ONLY): 15 min  Past Medical History:  Past Medical History:  Diagnosis Date   Diabetes mellitus type II, non insulin dependent (HCC)    Essential hypertension [name]   Heart failure (HCC)    Unknown details.  By report no cardiac surgery   Venous stasis dermatitis of left lower extremity    Past Surgical History:  Past Surgical History:  Procedure Laterality Date   CARDIAC CATHETERIZATION     LEFT HEART CATH AND CORONARY ANGIOGRAPHY N/A 12/25/2020   Procedure: LEFT HEART CATH AND CORONARY ANGIOGRAPHY;  Surgeon: Marykay Lex, MD;  Location: [facility] INVASIVE CV LAB;  Service: Cardiovascular;  Laterality: N/A;   PEG PLACEMENT N/A 01/11/2021   Procedure: PERCUTANEOUS ENDOSCOPIC GASTROSTOMY (PEG) PLACEMENT;  Surgeon: Midge Minium, MD;  Location: [facility] ENDOSCOPY;  Service: Endoscopy;  Laterality: N/A;   RIGHT HEART CATH N/A 12/25/2020   Procedure: RIGHT HEART CATH;  Surgeon: Marykay Lex, MD;  Location: [facility] INVASIVE CV LAB;  Service: Cardiovascular;  Laterality: N/A;   TRACHEOSTOMY TUBE PLACEMENT N/A 01/12/2021   Procedure: TRACHEOSTOMY;  Surgeon: Bud Face, MD;  Location: [facility] ORS;  Service: ENT;  Laterality: N/A;   Unknown     HPI:  Pt is a 60 yo male that presented from home with respiratory distress 12/25/2020. Underwent defibrillation and CPR, emergently intubated. During admission pt has had a cardiac cath, peg tube and tracheostomy placed, weaned from vent 01/13/2021. 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 Mildly restricted diffusion involving the cortex more diffusely over the right cerebral convexity favored to reflect an acute MCA territory infarct over global hypoxic injury  given unilaterality".  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 No further acute events and pt continues to tolerate Trach collar @ 28% FiO2. #8 Shiley trach, cuffed.   Assessment / Plan / Recommendation Clinical Impression  Pt presents with behaviors charactistic of Rancho Level III. As such he presents with severe neuromotor and cognition based oropharyngeal dysphagia. Pt's oral phase is impaired d/t inattention to ice chip and his pharyngeal phase is c/b multiple swallows > 10 per ice chip, immediate cough and wet voice, suspect delayed swallow initiation. At this time, recommend pt remain NPO, with trials of ice chips under direct SLP supervision. SLP Visit Diagnosis: Cognitive communication deficit (R41.841);Dysphagia, oropharyngeal phase (R13.12)    Aspiration Risk       Diet Recommendation NPO   Medication Administration: Via alternative means    Other  Recommendations Oral Care Recommendations: Oral care QID Other Recommendations: Have oral suction available   Follow up Recommendations Inpatient Rehab      Frequency and Duration min 2x/week  4 weeks       Prognosis Prognosis for Safe Diet Advancement: Fair Barriers to Reach Goals: Cognitive deficits;Time post onset;Severity of deficits      Swallow Study   General HPI: Pt is a 60 yo male that presented from home with respiratory distress 12/25/2020. Underwent defibrillation and CPR, emergently intubated. During admission pt has had a cardiac cath, peg tube and tracheostomy placed, weaned from vent 01/13/2021. MRI results show "Small acute infarcts in the right  cerebellum and right cerebral hemisphere. Mildly restricted diffusion involving the cortex more diffusely over the right cerebral convexity favored to reflect an acute MCA territory infarct over global hypoxic injury given unilaterality".   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 No further acute events and pt continues to tolerate Trach collar @ 28% FiO2. #8 Shiley trach, cuffed. Type of Study: Bedside Swallow Evaluation Previous Swallow Assessment: none in chart Diet Prior to this Study: NPO Temperature Spikes Noted: No Respiratory Status: Trach;Trach Collar Trach Size and Type: Uncuffed;#6;With PMSV in place History of Recent Intubation: Yes Behavior/Cognition: Alert;Distractible;Requires cueing;Doesn't follow directions Oral Cavity Assessment: Dried secretions Oral Care Completed by SLP: Yes Oral Cavity - Dentition: Poor condition;Missing dentition Vision: Impaired for self-feeding Self-Feeding Abilities: Total assist Patient Positioning: Upright in bed Baseline Vocal Quality: Low vocal intensity Volitional Cough: Strong Volitional Swallow: Unable to elicit    Oral/Motor/Sensory Function Overall Oral Motor/Sensory Function: Moderate impairment   Ice Chips Ice chips: Impaired Presentation: Spoon Oral Phase Impairments: Reduced lingual movement/coordination;Poor awareness of bolus Oral Phase Functional Implications: Prolonged oral transit Pharyngeal Phase Impairments: Suspected delayed Swallow;Decreased hyoid-laryngeal movement;Multiple swallows;Wet Vocal Quality;Cough - Immediate   Thin Liquid Thin Liquid: Not tested    Nectar Thick Nectar Thick Liquid: Not tested   Honey Thick Honey Thick Liquid: Not tested   Puree Puree: Not tested   Solid     Solid: Not tested     Jeremy Browning B. Dreama Saa M.S., CCC-SLP, [facility] Speech-Language Pathologist Rehabilitation Services Office [phone]  Jeremy Browning Dreama Saa 02/02/2021,5:10 PM

## 2021-02-02 NOTE — Progress Notes (Signed)
VAST consulted to assess DL PICC as red lumen won't flush and both lumens lack blood return.  Contacted unit RN, Katherene Ponto via [employer]; requested a chest x-ray for PICC tip location be ordered by physician to ensure line is in proper position before administering TPA (last x-ray 5/20).

## 2021-02-03 ENCOUNTER — Inpatient Hospital Stay: Payer: Medicaid Other

## 2021-02-03 DIAGNOSIS — R1312 Dysphagia, oropharyngeal phase: Secondary | ICD-10-CM | POA: Diagnosis not present

## 2021-02-03 DIAGNOSIS — J810 Acute pulmonary edema: Secondary | ICD-10-CM | POA: Diagnosis not present

## 2021-02-03 DIAGNOSIS — I469 Cardiac arrest, cause unspecified: Secondary | ICD-10-CM | POA: Diagnosis not present

## 2021-02-03 DIAGNOSIS — N179 Acute kidney failure, unspecified: Secondary | ICD-10-CM | POA: Diagnosis not present

## 2021-02-03 LAB — GLUCOSE, CAPILLARY
Glucose-Capillary: 163 mg/dL — ABNORMAL HIGH (ref 70–99)
Glucose-Capillary: 162 mg/dL — ABNORMAL HIGH (ref 70–99)
Glucose-Capillary: 190 mg/dL — ABNORMAL HIGH (ref 70–99)
Glucose-Capillary: 158 mg/dL — ABNORMAL HIGH (ref 70–99)
Glucose-Capillary: 196 mg/dL — ABNORMAL HIGH (ref 70–99)

## 2021-02-03 LAB — URIC ACID: Uric Acid, Serum: 7.9 mg/dL (ref 3.7–8.6)

## 2021-02-03 LAB — SEDIMENTATION RATE: Sed Rate: 116 mm/h — ABNORMAL HIGH (ref 0–20)

## 2021-02-03 IMAGING — DX DG KNEE 1-2V*R*
2 series · 2 of 2 positions shown · non-contrast
Comparison: None.

CLINICAL DATA: Right knee pain and swelling, no known injury,
initial encounter

EXAM:
RIGHT KNEE - 2 VIEW

[knee ap]
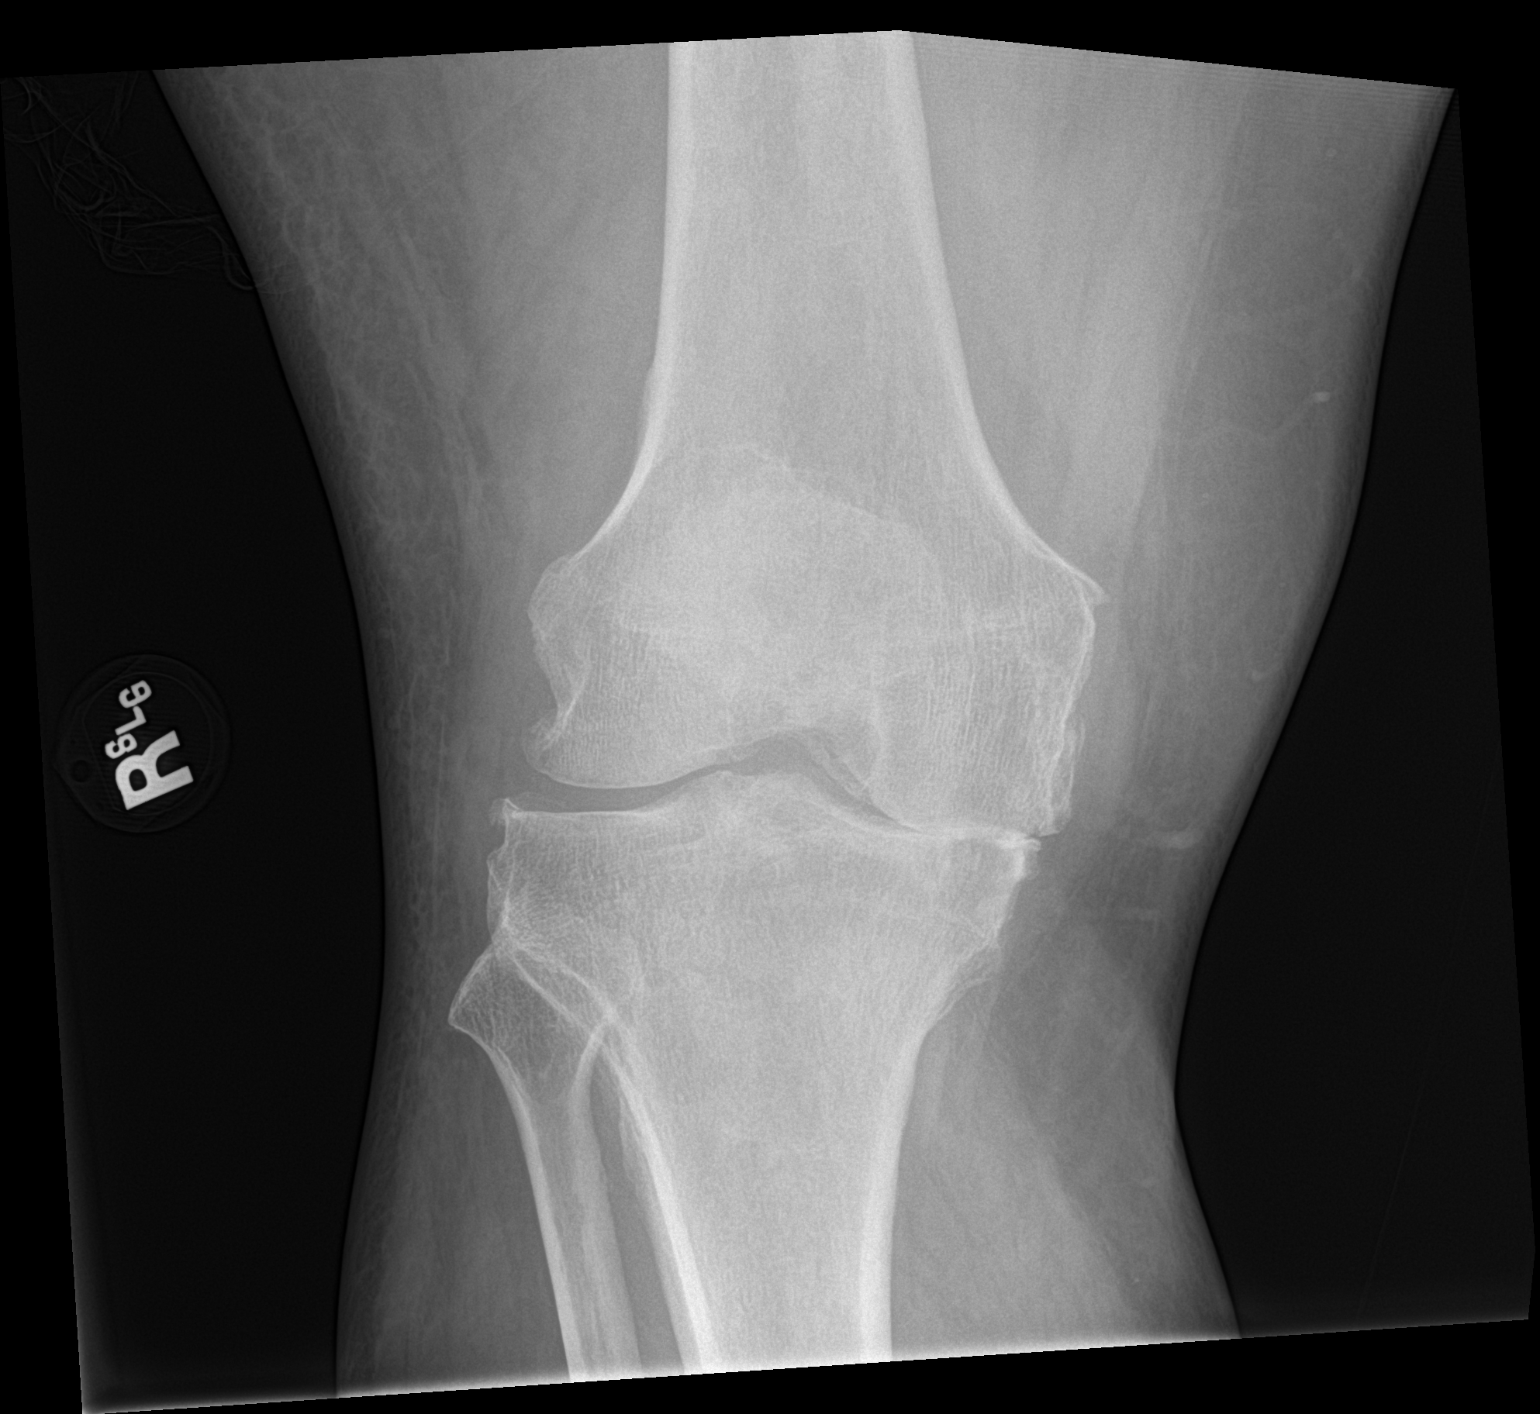

[knee lat]
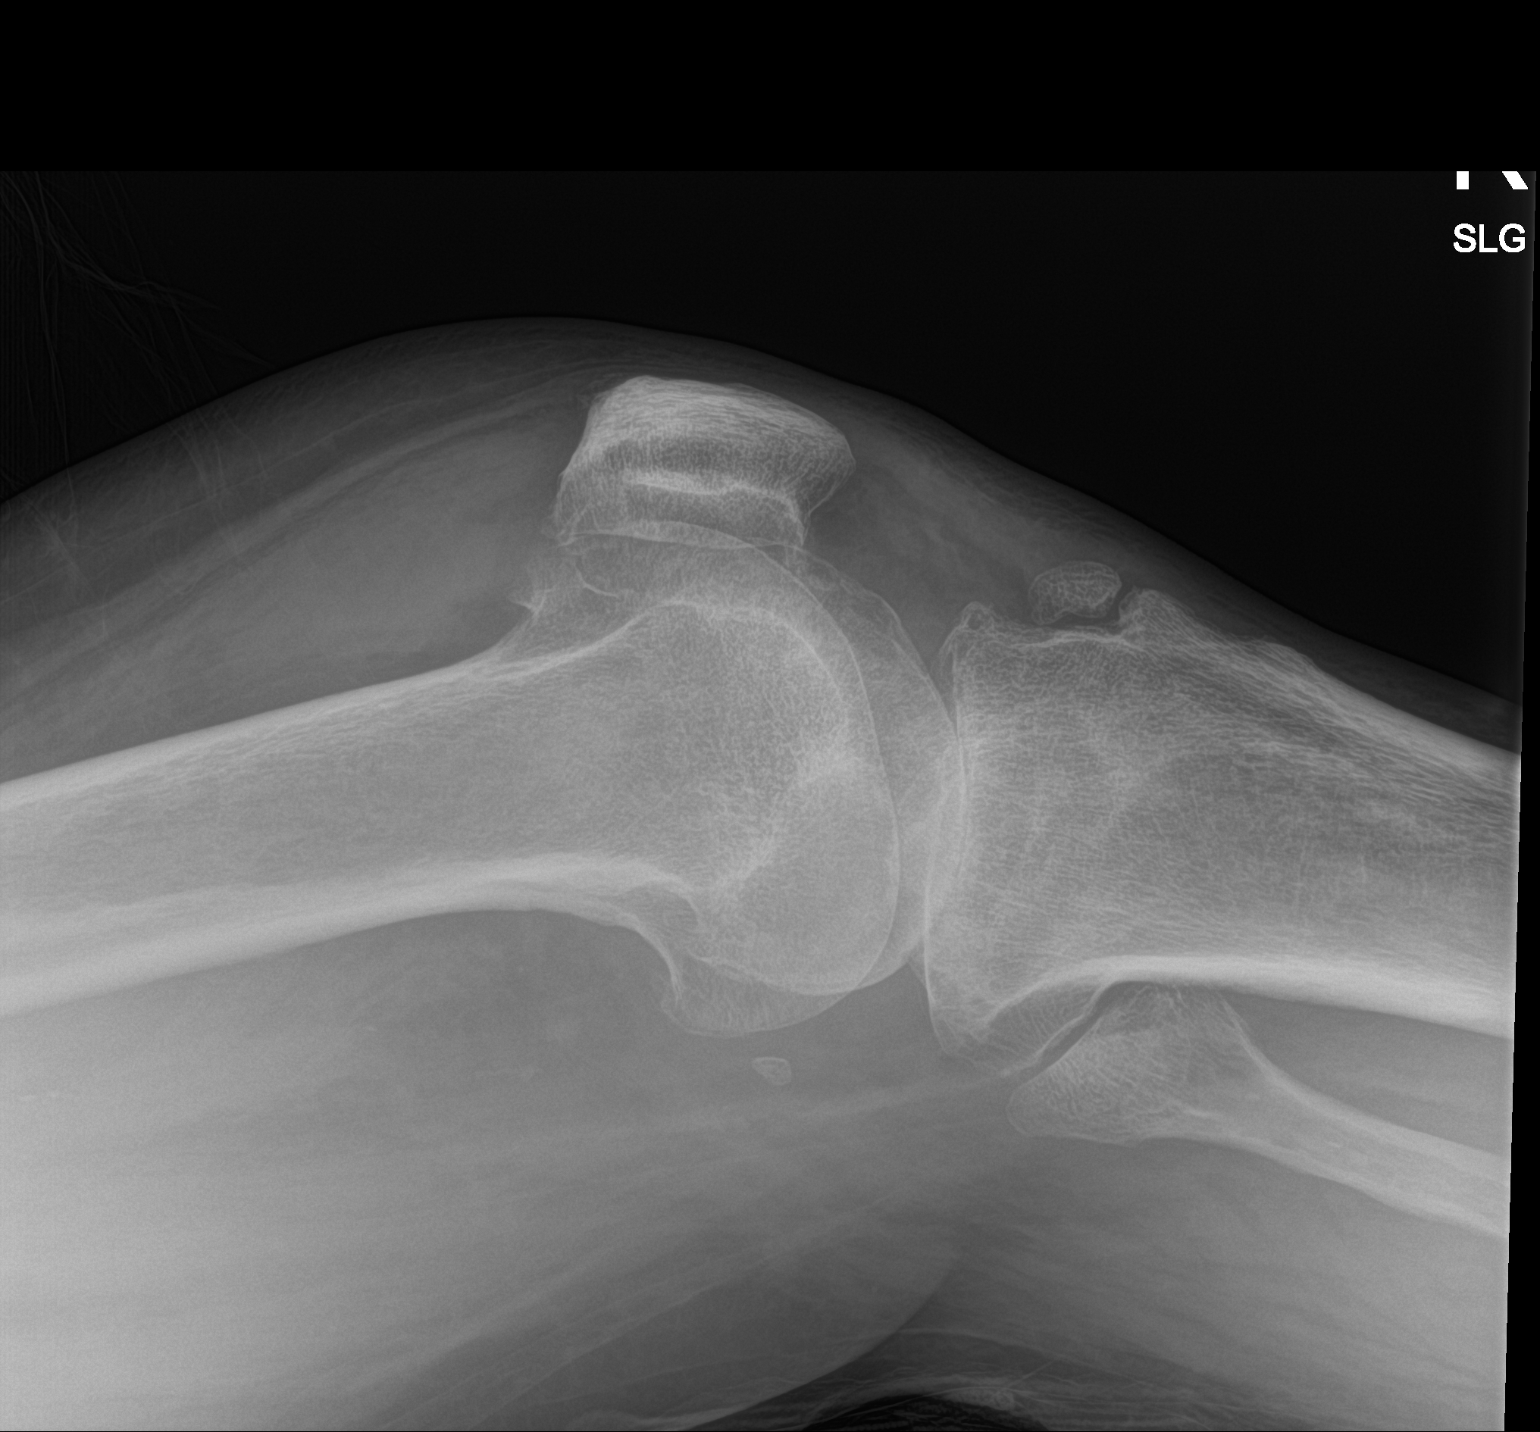

[2 of 2 positions shown; findings below may reference images not displayed]

FINDINGS: Tricompartmental degenerative changes are noted worst in the medial
joint space. Small joint effusion is noted. No acute fracture or
dislocation is noted.
IMPRESSION: Degenerative change with small joint effusion.

## 2021-02-03 MED ORDER — HYDRALAZINE HCL 50 MG PO TABS
50.0000 mg | ORAL_TABLET | Freq: Three times a day (TID) | ORAL | Status: DC
  Administered 2021-02-03 – 2021-02-25 (×63): 50 mg
  Filled 2021-02-03 (×65): qty 1

## 2021-02-03 MED ORDER — INSULIN ASPART 100 UNIT/ML IJ SOLN
0.0000 [IU] | Freq: Three times a day (TID) | INTRAMUSCULAR | Status: DC
  Administered 2021-02-03 – 2021-02-04 (×4): 3 [IU] via SUBCUTANEOUS
  Administered 2021-02-04: 5 [IU] via SUBCUTANEOUS
  Administered 2021-02-04 – 2021-02-05 (×4): 3 [IU] via SUBCUTANEOUS
  Administered 2021-02-05: 5 [IU] via SUBCUTANEOUS
  Administered 2021-02-05: 3 [IU] via SUBCUTANEOUS
  Administered 2021-02-06: 8 [IU] via SUBCUTANEOUS
  Administered 2021-02-06 (×2): 5 [IU] via SUBCUTANEOUS
  Administered 2021-02-06 – 2021-02-07 (×4): 3 [IU] via SUBCUTANEOUS
  Administered 2021-02-08: 5 [IU] via SUBCUTANEOUS
  Administered 2021-02-08 – 2021-02-09 (×6): 3 [IU] via SUBCUTANEOUS
  Administered 2021-02-09: 5 [IU] via SUBCUTANEOUS
  Administered 2021-02-10: 2 [IU] via SUBCUTANEOUS
  Administered 2021-02-10: 5 [IU] via SUBCUTANEOUS
  Administered 2021-02-10 (×2): 3 [IU] via SUBCUTANEOUS
  Administered 2021-02-11: 5 [IU] via SUBCUTANEOUS
  Administered 2021-02-11 (×2): 3 [IU] via SUBCUTANEOUS
  Administered 2021-02-11: 2 [IU] via SUBCUTANEOUS
  Administered 2021-02-12 (×2): 3 [IU] via SUBCUTANEOUS
  Administered 2021-02-12 – 2021-02-13 (×3): 5 [IU] via SUBCUTANEOUS
  Administered 2021-02-13: 3 [IU] via SUBCUTANEOUS
  Administered 2021-02-13: 5 [IU] via SUBCUTANEOUS
  Administered 2021-02-13 – 2021-02-14 (×2): 3 [IU] via SUBCUTANEOUS
  Administered 2021-02-14: 2 [IU] via SUBCUTANEOUS
  Administered 2021-02-14 – 2021-02-15 (×3): 3 [IU] via SUBCUTANEOUS
  Administered 2021-02-15: 2 [IU] via SUBCUTANEOUS
  Administered 2021-02-15: 3 [IU] via SUBCUTANEOUS
  Administered 2021-02-15: 5 [IU] via SUBCUTANEOUS
  Administered 2021-02-16: 2 [IU] via SUBCUTANEOUS
  Administered 2021-02-16 (×2): 5 [IU] via SUBCUTANEOUS
  Administered 2021-02-16 – 2021-02-17 (×2): 3 [IU] via SUBCUTANEOUS
  Administered 2021-02-17: 8 [IU] via SUBCUTANEOUS
  Administered 2021-02-18: 2 [IU] via SUBCUTANEOUS
  Administered 2021-02-18: 5 [IU] via SUBCUTANEOUS
  Administered 2021-02-18 (×2): 3 [IU] via SUBCUTANEOUS
  Administered 2021-02-19: 2 [IU] via SUBCUTANEOUS
  Administered 2021-02-19 (×2): 3 [IU] via SUBCUTANEOUS
  Administered 2021-02-20 (×2): 2 [IU] via SUBCUTANEOUS
  Administered 2021-02-20: 3 [IU] via SUBCUTANEOUS
  Administered 2021-02-21 – 2021-02-22 (×6): 2 [IU] via SUBCUTANEOUS
  Administered 2021-02-22: 3 [IU] via SUBCUTANEOUS
  Administered 2021-02-22: 5 [IU] via SUBCUTANEOUS
  Administered 2021-02-23: 2 [IU] via SUBCUTANEOUS
  Administered 2021-02-23 – 2021-02-24 (×4): 3 [IU] via SUBCUTANEOUS
  Administered 2021-02-24: 2 [IU] via SUBCUTANEOUS
  Administered 2021-02-24: 3 [IU] via SUBCUTANEOUS
  Administered 2021-02-25: 5 [IU] via SUBCUTANEOUS
  Administered 2021-02-25: 3 [IU] via SUBCUTANEOUS
  Administered 2021-02-25 – 2021-02-26 (×2): 2 [IU] via SUBCUTANEOUS
  Administered 2021-02-26: 3 [IU] via SUBCUTANEOUS
  Filled 2021-02-03 (×90): qty 1

## 2021-02-03 MED ORDER — CARVEDILOL 25 MG PO TABS
25.0000 mg | ORAL_TABLET | Freq: Two times a day (BID) | ORAL | Status: DC
  Administered 2021-02-03 – 2021-02-15 (×24): 25 mg
  Filled 2021-02-03 (×24): qty 1

## 2021-02-03 MED ORDER — ACETAMINOPHEN 160 MG/5ML PO SOLN
650.0000 mg | Freq: Four times a day (QID) | ORAL | Status: DC | PRN
  Administered 2021-02-03 – 2021-02-11 (×5): 650 mg
  Filled 2021-02-03 (×7): qty 20.3

## 2021-02-03 NOTE — Progress Notes (Signed)
Physical Therapy Treatment Patient Details Name: Jeremy Browning MRN: [id number] DOB: [birth year] Today's Date: 02/03/2021    History of Present Illness Pt is a 60 yo male that presented from home with respiratory distress 12/25/2020. Underwent defibrillation and CPR, emergently intubated. During admission pt has had a cardiac cath, peg tube and tracheostomy placed, weaned from vent 5/26 AM. 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 Mildly restricted diffusion involving the cortex more diffusely over the right cerebral convexity favored to reflect an acute MCA territory infarct over global hypoxic injury given unilaterality".    PT Comments    Patient alert throughout session, followed one step commands consistently through session, exhibited III Rancho level. Pt oriented to self, and reported that he was in a doctors office. Pt able to move RUE, LLE, and RLE on command with AAROM (LLE trace muscle activation noted.) The patient did exhibit increased anxiety with sitting today, and pushing behavior with RUE. maxAx3 to laterally scoot to the chair, and repositioned for comfort. The patient continues to make progress in alertness, ability to follow commands, mobility, and function. Recommendations remains appropriate to maximize function and independence.     Follow Up Recommendations  CIR     Equipment Recommendations  None recommended by PT    Recommendations for Other Services Speech consult     Precautions / Restrictions Precautions Precautions: Fall Precaution Comments: trach, PEG Restrictions Weight Bearing Restrictions: No    Mobility  Bed Mobility Overal bed mobility: Needs Assistance       Supine to sit: Max assist;+2 for physical assistance;HOB elevated     General bed mobility comments: pt reaching UE during trunk elevation, some abdominal flexion noted    Transfers Overall transfer level: Needs assistance Equipment  used: Rolling walker (2 wheeled) Transfers: Lateral/Scoot Transfers          Lateral/Scoot Transfers: Max assist (+3) General transfer comment: PT assisted pt with forward lean, able to rest head against PT shoulder, maintain balance  Ambulation/Gait                 Stairs             Wheelchair Mobility    Modified Rankin (Stroke Patients Only)       Balance Overall balance assessment: Needs assistance Sitting-balance support: Bilateral upper extremity supported;Feet supported Sitting balance-Leahy Scale: Poor Sitting balance - Comments: Pt with pushing behaviors to R today Postural control: Left lateral lean                                  Cognition Arousal/Alertness: Awake/alert Behavior During Therapy: WFL for tasks assessed/performed Overall Cognitive Status: Impaired/Different from baseline Area of Impairment: Rancho level;Orientation               Rancho Levels of Cognitive Functioning Rancho Los Amigos Scales of Cognitive Functioning: Localized response               General Comments: pt reported that he was in a doctors office. disoriented to time      Exercises Other Exercises Other Exercises: pt able to move LEs R > L,very minimal quad activation noted on LLE. Other Exercises: Pt able to reach across to midline to LUE with PT holding LLE. One step command    General Comments        Pertinent Vitals/Pain Pain Assessment: Faces Faces Pain Scale: Hurts even  more Pain Location: R knee - with all movement attempts Pain Descriptors / Indicators: Grimacing;Guarding;Moaning Pain Intervention(s): Limited activity within patient's tolerance;Repositioned;Monitored during session    Home Living                      Prior Function            PT Goals (current goals can now be found in the care plan section) Progress towards PT goals: Progressing toward goals    Frequency    Min 2X/week      PT  Plan Current plan remains appropriate    Co-evaluation              AM-PAC PT "6 Clicks" Mobility   Outcome Measure  Help needed turning from your back to your side while in a flat bed without using bedrails?: Total Help needed moving from lying on your back to sitting on the side of a flat bed without using bedrails?: Total Help needed moving to and from a bed to a chair (including a wheelchair)?: Total Help needed standing up from a chair using your arms (e.g., wheelchair or bedside chair)?: Total Help needed to walk in hospital room?: Total Help needed climbing 3-5 steps with a railing? : Total 6 Click Score: 6    End of Session Equipment Utilized During Treatment: Other (comment) (trach) Activity Tolerance: Patient tolerated treatment well Patient left: in bed;with call bell/phone within reach;with bed alarm set Nurse Communication: Mobility status PT Visit Diagnosis: Other abnormalities of gait and mobility (R26.89);Muscle weakness (generalized) (M62.81);Other symptoms and signs involving the nervous system (R29.898)     Time: 0254-2706 PT Time Calculation (min) (ACUTE ONLY): 34 min  Charges:  $Therapeutic Activity: 8-22 mins $Neuromuscular Re-education: 8-22 mins                     Olga Coaster PT, DPT 4:32 PM,02/03/21

## 2021-02-03 NOTE — Progress Notes (Signed)
[place] [facility] [place] NAME: Jeremy Browning    MR#:  [id number]  DATE OF BIRTH:  Feb 19, 1961  SUBJECTIVE:  patient awake during most of my conversation.  No family in the room. More clear with words today Pain over the right knee and gets irritable when I tired to move it Low grade temp this am  REVIEW OF SYSTEMS:   Review of Systems  Unable to perform ROS: Medical condition  Tolerating Diet:NPO- PEG feeding  Tolerating PT: CIR  DRUG ALLERGIES:  Not on File  VITALS:  Blood pressure (!) 126/50, pulse 67, temperature 97.7 F (36.5 C), resp. rate 18, height 6' (1.829 m), weight 112.6 kg, SpO2 100 %.  PHYSICAL EXAMINATION:   Physical Exam  GENERAL:  60 y.o.-year-old patient lying in the bed with no acute distress.  Appears chrnically ill HEENT: Head atraumatic, normocephalic. Poor dentition  NECK:  Supple, no jugular venous distention. No thyroid enlargement, no tenderness. Trach collar + LUNGS: Normal breath sounds bilaterally, no wheezing, rales, rhonchi. No use of accessory muscles of respiration.  CARDIOVASCULAR: S1, S2 normal. No murmurs, rubs, or gallops.  ABDOMEN: Soft, nontender, nondistended. Bowel sounds present. No organomegaly or mass. PEG + EXTREMITIES: No cyanosis, clubbing or edema b/l.  Right Knee swelling+ >left and decreased ROM  NEUROLOGIC: able to assess  PSYCHIATRIC:  patient is alert + SKIN:  per RN LABORATORY PANEL:  CBC Recent Labs  Lab 02/02/21 1245  WBC 13.5*  HGB 9.7*  HCT 31.2*  PLT 326     Chemistries  Recent Labs  Lab 02/02/21 1245  NA 138  K 4.1  CL 108  CO2 24  GLUCOSE 184*  BUN 57*  CREATININE 1.37*  CALCIUM 8.9  AST 78*  ALT 199*  ALKPHOS 125  BILITOT 0.4    Cardiac Enzymes No results for input(s): TROPONINI in the last 168 hours. RADIOLOGY:  DG Chest 1 View  Result Date: 02/02/2021 CLINICAL DATA:  Central placement EXAM: CHEST  1 VIEW COMPARISON:  Jan 07, 2021 FINDINGS:  Central catheter tip in superior vena cava near cavoatrial junction. Tracheostomy catheter tip is 5.9 cm above the carina. No pneumothorax. There is no edema or airspace opacity. Heart is borderline enlarged with pulmonary vascularity normal. No adenopathy. No bone lesions. IMPRESSION: Tube and catheter positions as described pneumothorax. Lungs clear. Stable cardiac prominence. Electronically Signed   By: Lowella Grip III M.D.   On: 02/02/2021 12:12   US Venous Img Lower Unilateral Left (DVT)  Result Date: 02/02/2021 CLINICAL DATA:  Left lower extremity edema EXAM: LEFT LOWER EXTREMITY VENOUS DOPPLER ULTRASOUND TECHNIQUE: Gray-scale sonography with compression, as well as color and duplex ultrasound, were performed to evaluate the deep venous system(s) from the level of the common femoral vein through the popliteal and proximal calf veins. COMPARISON:  None. FINDINGS: VENOUS Normal compressibility of the common femoral, superficial femoral, and popliteal veins, as well as the visualized calf veins. Visualized portions of profunda femoral vein and great saphenous vein unremarkable. No filling defects to suggest DVT on grayscale or color Doppler imaging. Doppler waveforms show normal direction of venous flow, normal respiratory plasticity and response to augmentation. Limited views of the contralateral common femoral vein are unremarkable. OTHER None. Limitations: none IMPRESSION: Negative. Electronically Signed   By: Jacqulynn Cadet M.D.   On: 02/02/2021 16:31   ASSESSMENT AND PLAN:   Acute hypoxic respiratory failure in the setting of cardiac arrest, pulmonary edema, Proteus pneumonia -treated  while in the ICU, completed a course of antibiotics with ceftriaxone and Unasyn.  Has not required them for the past week, tolerating trach collar -Continue aggressive pulmonary toilet, continue bronchodilators -Respiratory status is stable  Cardiac arrest / V. Tach / non-STEMI / acute combined systolic and  diastolic CHF /cardiogenic shock --requiring pressors while in the ICU, currently resolved. -- Cardiology consulted and underwent a cardiac cath on 5/7 which showed normal coronaries.  -- A 2D echo done on 5/9 showed an EF of 45-50% with global hypokinesis of the left ventricle.  It also showed grade 1 diastolic dysfunction.  Appears euvolemic  Right knee swelling with decreased ROM -- will get uric acid, ESR, x-ray to view. -- PRN Tylenol -- consultation if needed   Elevated LFTs -noticed on 6/11, overall trending down.   --Hepatitis panel negative, right upper quadrant ultrasound negative.  --Discontinued Tylenol and statin   Anoxic brain encephalopathy, CVAs-high suspicion based on the MRI, clinically stable currently.   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  Neurology consulted and evaluated patient while hospitalized.  His left side is flaccid.  Mental status seems to be improving, he is more interactive, and has been talking more for the last couple of days   Acute kidney injury-resolved, baseline creatinine seems to be around 1.1-1.2   Anemia of chronic disease-hemoglobin stable, no bleeding  Nutrition--cont PEG feeding --6/15-- patient did not tolerate swallow study/evaluation by speech therapy  Remove PICC line   Procedures:trach.PEG Family communication :none  Consults : CODE STATUS: DNR DVT Prophylaxis :enoxaparin Level of care: Progressive Cardiac Status is: Inpatient  Remains inpatient appropriate because:Unsafe d/c plan  Dispo: The patient is from: Home              Anticipated d/c is to: TBD CIR vs snf              Patient currently is medically stable to d/c.   Difficult to place patient Yes        TOTAL TIME TAKING CARE OF THIS PATIENT: 25 minutes.  >50% time spent on counselling and coordination of care  Note: This dictation  was prepared with Dragon dictation along with smaller phrase technology. Any transcriptional errors that result from this process are unintentional.  Fritzi Mandes M.D    [facility]   CC: Primary care physician; No primary care provider on file. Patient ID: Jeremy Browning, male   DOB: Jul 61, 1962, 61 y.o.   MRN: [id number]

## 2021-02-03 NOTE — Progress Notes (Signed)
Occupational Therapy Treatment Patient Details Name: Jeremy Browning MRN: [id number] DOB: Jun 07, 1961 Today's Date: 02/03/2021    History of present illness Pt is a 60 yo male that presented from home with respiratory distress 12/25/2020. Underwent defibrillation and CPR, emergently intubated. During admission pt has had a cardiac cath, peg tube and tracheostomy placed, weaned from vent 5/26 AM. 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 Mildly restricted diffusion involving the cortex more diffusely over the right cerebral convexity favored to reflect an acute MCA territory infarct over global hypoxic injury given unilaterality".   OT comments  Upon entering the room, pt seated in recliner chair. This is second afternoon therapy session and pt has been sitting up for ~1.5 hours. Pt greets therapist and is oriented to self only. Focus on anterior weight shift with max cuing and facilitation for lift pad to be properly placed. Pt does become fearful of anterior weight shift and calls out " I'm gonna fall" but does not become resistive at this time. OT leaning pt L <> R for continued placement and the pt observed to be pushing with R UE to the L while in chair. Pt follows commands for R UE placement with transfer in sling for safety. Pt rolling L <> R with max - total A to remove sling. Pt does follow commands to reach for bedrail with R UE with increased time to process information. Wash cloth placed in R UE and pt washing face with mod multimodal cuing for initiation, sequencing, and completion of task. Pt noted to be having small BM and staff called to assist. B Prevalon boots donned and pt placed in side lying position for comfort. All needs within reach and pt asleep as therapist exits the room. Pt would benefit from intensive rehab program to address functional deficits with significant progress being made this week towards therapy goals.    Follow Up  Recommendations  CIR;Supervision/Assistance - 24 hour    Equipment Recommendations  Other (comment) (defer to next venue of care)    Recommendations for Other Services      Precautions / Restrictions Precautions Precautions: Fall Precaution Comments: trach, PEG Restrictions Weight Bearing Restrictions: No       Mobility Bed Mobility Overal bed mobility: Needs Assistance Bed Mobility: Rolling Rolling: Max assist;Total assist   Supine to sit: Max assist;+2 for physical assistance;HOB elevated     General bed mobility comments: pt reaching UE during trunk elevation, some abdominal flexion noted    Transfers Overall transfer level: Needs assistance Equipment used: Rolling walker (2 wheeled) Transfers: Lateral/Scoot Transfers          Lateral/Scoot Transfers: Max assist (+3) General transfer comment: use of hoyer to return to bed    Balance Overall balance assessment: Needs assistance Sitting-balance support: Bilateral upper extremity supported;Feet supported Sitting balance-Leahy Scale: Poor Sitting balance - Comments: Pt with pushing behaviors to R today Postural control: Left lateral lean                                 ADL either performed or assessed with clinical judgement   ADL       Grooming: Wash/dry face;Set up;Supervision/safety Grooming Details (indicate cue type and reason): Pt washed face with use of R UE with mod  cuing to initiate task  Vision Baseline Vision/History: No visual deficits            Cognition Arousal/Alertness: Awake/alert Behavior During Therapy: Flat affect Overall Cognitive Status: Impaired/Different from baseline Area of Impairment: Rancho level;Orientation               Rancho Levels of Cognitive Functioning Rancho Los Amigos Scales of Cognitive Functioning: Localized response Orientation Level: Disoriented to;Place;Time;Situation              General Comments: pt reported that he was in a doctors office. disoriented to time        Exercises Other Exercises Other Exercises: pt able to move LEs R > L,very minimal quad activation noted on LLE. Other Exercises: Pt able to reach across to midline to LUE with PT holding LLE. One step command   Shoulder Instructions       General Comments      Pertinent Vitals/ Pain       Pain Assessment: Faces Faces Pain Scale: Hurts even more Pain Location: R knee - with all movement attempts Pain Descriptors / Indicators: Grimacing;Guarding;Moaning Pain Intervention(s): Limited activity within patient's tolerance;Monitored during session;Repositioned                                                          Frequency  Min 2X/week        Progress Toward Goals  OT Goals(current goals can now be found in the care plan section)  Progress towards OT goals: Progressing toward goals  Acute Rehab OT Goals Patient Stated Goal: none stated OT Goal Formulation: With patient Time For Goal Achievement: 02/08/21 Potential to Achieve Goals: Fair  Plan Discharge plan remains appropriate;Frequency remains appropriate    Co-evaluation      Reason for Co-Treatment: Complexity of the patient's impairments (multi-system involvement);Necessary to address cognition/behavior during functional activity;To address functional/ADL transfers          AM-PAC OT "6 Clicks" Daily Activity     Outcome Measure   Help from another person eating meals?: Total Help from another person taking care of personal grooming?: A Lot Help from another person toileting, which includes using toliet, bedpan, or urinal?: Total Help from another person bathing (including washing, rinsing, drying)?: Total Help from another person to put on and taking off regular upper body clothing?: Total Help from another person to put on and taking off regular lower body clothing?: Total 6 Click  Score: 7    End of Session Equipment Utilized During Treatment: Oxygen;Other (comment) (trach)  OT Visit Diagnosis: Other symptoms and signs involving the nervous system (R29.898);Other symptoms and signs involving cognitive function   Activity Tolerance Patient tolerated treatment well   Patient Left in bed;with call bell/phone within reach;with bed alarm set   Nurse Communication Mobility status;Other (comment) (Pt returned to bed and needing assistance for hygiene)        Time: 1550-1616 OT Time Calculation (min): 26 min  Charges: OT General Charges $OT Visit: 1 Visit OT Treatments $Self Care/Home Management : 23-37 mins  Jackquline Denmark, MS, OTR/L , CBIS ascom [phone]  02/03/21, 5:49 PM

## 2021-02-04 DIAGNOSIS — I469 Cardiac arrest, cause unspecified: Secondary | ICD-10-CM | POA: Diagnosis not present

## 2021-02-04 DIAGNOSIS — R1312 Dysphagia, oropharyngeal phase: Secondary | ICD-10-CM | POA: Diagnosis not present

## 2021-02-04 DIAGNOSIS — N179 Acute kidney failure, unspecified: Secondary | ICD-10-CM | POA: Diagnosis not present

## 2021-02-04 DIAGNOSIS — J810 Acute pulmonary edema: Secondary | ICD-10-CM | POA: Diagnosis not present

## 2021-02-04 LAB — GLUCOSE, CAPILLARY
Glucose-Capillary: 159 mg/dL — ABNORMAL HIGH (ref 70–99)
Glucose-Capillary: 160 mg/dL — ABNORMAL HIGH (ref 70–99)
Glucose-Capillary: 177 mg/dL — ABNORMAL HIGH (ref 70–99)
Glucose-Capillary: 183 mg/dL — ABNORMAL HIGH (ref 70–99)
Glucose-Capillary: 192 mg/dL — ABNORMAL HIGH (ref 70–99)
Glucose-Capillary: 217 mg/dL — ABNORMAL HIGH (ref 70–99)
Glucose-Capillary: 219 mg/dL — ABNORMAL HIGH (ref 70–99)

## 2021-02-04 MED ORDER — METHYLPREDNISOLONE SODIUM SUCC 40 MG IJ SOLR
40.0000 mg | INTRAMUSCULAR | Status: DC
  Administered 2021-02-04 – 2021-02-06 (×3): 40 mg via INTRAVENOUS
  Filled 2021-02-04 (×3): qty 1

## 2021-02-04 NOTE — Consult Note (Addendum)
ORTHOPAEDIC CONSULTATION  REQUESTING PHYSICIAN: Enedina Finner, MD  Chief Complaint: Right knee swelling and pain  HPI: Jeremy Browning is a 60 y.o. male hospitalized for cardiogenic shock since 12/25/2020.  Patient had an extended stay in the ICU.  Patient has now been transferred to the floor with a tracheostomy tube.  Physical and Occupational Therapy have begun and he is noted to pad right knee swelling and pain.  Orthopedics is consulted for evaluation of his right knee.  Patient is seen in his hospital bed.  Patient appears lethargic/sedated and is unable to provide history.  He opens his eyes to voice but was unable to verbalize any history.  Past Medical History:  Diagnosis Date   Diabetes mellitus type II, non insulin dependent (HCC)    Essential hypertension [name]   Heart failure (HCC)    Unknown details.  By report no cardiac surgery   Venous stasis dermatitis of left lower extremity    Past Surgical History:  Procedure Laterality Date   CARDIAC CATHETERIZATION     LEFT HEART CATH AND CORONARY ANGIOGRAPHY N/A 12/25/2020   Procedure: LEFT HEART CATH AND CORONARY ANGIOGRAPHY;  Surgeon: Marykay Lex, MD;  Location: [facility] INVASIVE CV LAB;  Service: Cardiovascular;  Laterality: N/A;   PEG PLACEMENT N/A 01/11/2021   Procedure: PERCUTANEOUS ENDOSCOPIC GASTROSTOMY (PEG) PLACEMENT;  Surgeon: Midge Minium, MD;  Location: [facility] ENDOSCOPY;  Service: Endoscopy;  Laterality: N/A;   RIGHT HEART CATH N/A 12/25/2020   Procedure: RIGHT HEART CATH;  Surgeon: Marykay Lex, MD;  Location[facility] INVASIVE CV LAB;  Service: Cardiovascular;  Laterality: N/A;   TRACHEOSTOMY TUBE PLACEMENT N/A 01/12/2021   Procedure: TRACHEOSTOMY;  Surgeon: Bud Face, MD;  Location: [facility] ORS;  Service: ENT;  Laterality: N/A;   Unknown     Social History   Socioeconomic History   Marital status: Widowed    Spouse name: Not on file   Number of children: Not on file   Years of education: Not on file    Highest education level: Not on file  Occupational History   Not on file  Tobacco Use   Smoking status: Not on file   Smokeless tobacco: Not on file  Substance and Sexual Activity   Alcohol use: Not on file   Drug use: Not on file   Sexual activity: Not on file  Other Topics Concern   Not on file  Social History Narrative   He lives with his brother and the brother's family.  Recently moved here to the [place] area.  Has never been established here at [facility].   Social Determinants of Health   Financial Resource Strain: Not on file  Food Insecurity: Not on file  Transportation Needs: Not on file  Physical Activity: Not on file  Stress: Not on file  Social Connections: Not on file   Family History  Family history unknown: Yes   Not on File Prior to Admission medications   Medication Sig Start Date End Date Taking? Authorizing Provider  aspirin EC 81 MG tablet Take 81 mg by mouth daily. Swallow whole.   Yes [provider]  atorvastatin (LIPITOR) 80 MG tablet Take 80 mg by mouth daily.   Yes [provider]  carvedilol (COREG) 12.5 MG tablet Take 12.5 mg by mouth 2 (two) times daily with a meal.   Yes [provider]  furosemide (LASIX) 80 MG tablet Take 80 mg by mouth daily.   Yes [provider]  losartan (COZAAR) 50 MG  tablet Take 50 mg by mouth daily.   Yes [provider]   DG Knee 1-2 Views Right  Result Date: 02/03/2021 CLINICAL DATA:  Right knee pain and swelling, no known injury, initial encounter EXAM: RIGHT KNEE - 2 VIEW COMPARISON:  None. FINDINGS: Tricompartmental degenerative changes are noted worst in the medial joint space. Small joint effusion is noted. No acute fracture or dislocation is noted. IMPRESSION: Degenerative change with small joint effusion. Electronically Signed   By: Alcide Clever M.D.   On: 02/03/2021 16:15   US Venous Img Lower Unilateral Left (DVT)  Result Date: 02/02/2021 CLINICAL DATA:  Left lower  extremity edema EXAM: LEFT LOWER EXTREMITY VENOUS DOPPLER ULTRASOUND TECHNIQUE: Gray-scale sonography with compression, as well as color and duplex ultrasound, were performed to evaluate the deep venous system(s) from the level of the common femoral vein through the popliteal and proximal calf veins. COMPARISON:  None. FINDINGS: VENOUS Normal compressibility of the common femoral, superficial femoral, and popliteal veins, as well as the visualized calf veins. Visualized portions of profunda femoral vein and great saphenous vein unremarkable. No filling defects to suggest DVT on grayscale or color Doppler imaging. Doppler waveforms show normal direction of venous flow, normal respiratory plasticity and response to augmentation. Limited views of the contralateral common femoral vein are unremarkable. OTHER None. Limitations: none IMPRESSION: Negative. Electronically Signed   By: Malachy Moan M.D.   On: 02/02/2021 16:31    Positive ROS: All other systems have been reviewed and were otherwise negative with the exception of those mentioned in the HPI and as above.  Physical Exam: General: Sedated/lethargic  MUSCULOSKELETAL: Right knee: Patient has a moderate effusion without ecchymosis or deformity.  His leg and thigh compartments are soft and compressible.  He groaned slightly with palpation to the knee globally.  Given patient's positioning in bed it is difficult to assess range of motion but slight passive range of motion of the right knee caused mild burning.  He has boots on his ankles to prevent ulceration.  His feet appear well-perfused.  He appeared to flex and extend his toes in both feet to command.  Assessment: Right knee effusion secondary to advanced right knee osteoarthritis  Plan: I reviewed the patient's x-rays which show advanced right knee osteoarthritis.  I suspect his right knee pain and effusion are from the underlying arthritis.  I discussed this case with the hospitalist, Dr. Enedina Finner.  Patient had a recent low-grade fever but has no active infection.  I recommend oral or IV steroid treatment if not medically contraindicated.  I would like to see how the patient responds to systemic steroid treatment over the next couple days.  If he does not show improvement then possible steroid injection and/or aspiration of the right knee could be considered.  There are no restrictions to the right knee.  The patient may weight-bear as tolerated and perform activity including PT and OT,  as his pain allows.    Juanell Fairly, MD    02/04/2021 1:23 PM

## 2021-02-04 NOTE — Progress Notes (Signed)
[place] [facility] [place] NAME: Jeremy Browning    MR#:  [id number]  DATE OF BIRTH:  [birth year]  SUBJECTIVE:  patient awake during most of my conversation.  No family in the room. More clear with words today Pain over the right knee and gets irritable when I tired to move it  PEG feeding ok  REVIEW OF SYSTEMS:   Review of Systems  Unable to perform ROS: Medical condition  Tolerating Diet:NPO- PEG feeding  Tolerating PT: CIR  DRUG ALLERGIES:  Not on File  VITALS:  Blood pressure (!) 154/59, pulse 70, temperature 98.7 F (37.1 C), temperature source Oral, resp. rate 19, height 6' (1.829 m), weight 111.7 kg, SpO2 100 %.  PHYSICAL EXAMINATION:   Physical Exam  GENERAL:  60 y.o.-year-old patient lying in the bed with no acute distress.  Appears chrnically ill HEENT: Head atraumatic, normocephalic. Poor dentition  NECK:  Supple, no jugular venous distention. No thyroid enlargement, no tenderness. Trach collar + LUNGS: Normal breath sounds bilaterally, no wheezing, rales, rhonchi. No use of accessory muscles of respiration.  CARDIOVASCULAR: S1, S2 normal. No murmurs, rubs, or gallops.  ABDOMEN: Soft, nontender, nondistended. Bowel sounds present. No organomegaly or mass. PEG + EXTREMITIES: No cyanosis, clubbing or edema b/l.  Right Knee swelling+ >left and decreased ROM  NEUROLOGIC: able to assess  PSYCHIATRIC:  patient is alert + SKIN:  per RN LABORATORY PANEL:  CBC Recent Labs  Lab 02/02/21 1245  WBC 13.5*  HGB 9.7*  HCT 31.2*  PLT 326     Chemistries  Recent Labs  Lab 02/02/21 1245  NA 138  K 4.1  CL 108  CO2 24  GLUCOSE 184*  BUN 57*  CREATININE 1.37*  CALCIUM 8.9  AST 78*  ALT 199*  ALKPHOS 125  BILITOT 0.4    Cardiac Enzymes No results for input(s): TROPONINI in the last 168 hours. RADIOLOGY:  DG Knee 1-2 Views Right  Result Date: 02/03/2021 CLINICAL DATA:  Right knee pain and swelling, no known injury,  initial encounter EXAM: RIGHT KNEE - 2 VIEW COMPARISON:  None. FINDINGS: Tricompartmental degenerative changes are noted worst in the medial joint space. Small joint effusion is noted. No acute fracture or dislocation is noted. IMPRESSION: Degenerative change with small joint effusion. Electronically Signed   By: Inez Catalina M.D.   On: 02/03/2021 16:15   US Venous Img Lower Unilateral Left (DVT)  Result Date: 02/02/2021 CLINICAL DATA:  Left lower extremity edema EXAM: LEFT LOWER EXTREMITY VENOUS DOPPLER ULTRASOUND TECHNIQUE: Gray-scale sonography with compression, as well as color and duplex ultrasound, were performed to evaluate the deep venous system(s) from the level of the common femoral vein through the popliteal and proximal calf veins. COMPARISON:  None. FINDINGS: VENOUS Normal compressibility of the common femoral, superficial femoral, and popliteal veins, as well as the visualized calf veins. Visualized portions of profunda femoral vein and great saphenous vein unremarkable. No filling defects to suggest DVT on grayscale or color Doppler imaging. Doppler waveforms show normal direction of venous flow, normal respiratory plasticity and response to augmentation. Limited views of the contralateral common femoral vein are unremarkable. OTHER None. Limitations: none IMPRESSION: Negative. Electronically Signed   By: Jacqulynn Cadet M.D.   On: 02/02/2021 16:31   ASSESSMENT AND PLAN:   Acute hypoxic respiratory failure in the setting of cardiac arrest, pulmonary edema, Proteus pneumonia -treated while in the ICU, completed a course of antibiotics with ceftriaxone and Unasyn.  Has  not required them for the past week, tolerating trach collar -Continue aggressive pulmonary toilet, continue bronchodilators -Respiratory status is stable  Cardiac arrest / V. Tach / non-STEMI / acute combined systolic and diastolic CHF /cardiogenic shock --requiring pressors while in the ICU, currently resolved. --  Cardiology consulted and underwent a cardiac cath on 5/7 which showed normal coronaries.  -- A 2D echo done on 5/9 showed an EF of 45-50% with global hypokinesis of the left ventricle.  It also showed grade 1 diastolic dysfunction.  Appears euvolemic  Right knee swelling with decreased ROM  Severe DJD on xray knee -- ESR 115 , x-ray to view severe tricompartmental DJD -- PRN Tylenol -- discussed with Dr. Mack Guise orthopedic. Recommends IV steroids for now. If no improvement he will try to inject the joint.   Elevated LFTs -noticed on 6/11, overall trending down.   --Hepatitis panel negative, right upper quadrant ultrasound negative.  --Discontinued Tylenol and statin   Anoxic brain encephalopathy, CVAs-high suspicion based on the MRI, clinically stable currently.   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  Neurology consulted and evaluated patient while hospitalized.  His left side is flaccid.  Mental status seems to be improving, he is more interactive, and has been talking more for the last couple of days   Acute kidney injury-resolved, baseline creatinine seems to be around 1.1-1.2   Anemia of chronic disease-hemoglobin stable, no bleeding  Nutrition--cont PEG feeding --6/15-- patient did not tolerate swallow study/evaluation by speech therapy  Remove PICC line   Procedures:trach.PEG Family communication :none  Consults : CODE STATUS: DNR DVT Prophylaxis :enoxaparin Level of care: Progressive Cardiac Status is: Inpatient  Remains inpatient appropriate because:Unsafe d/c plan  Dispo: The patient is from: Home              Anticipated d/c is to: TBD CIR vs snf              Patient currently is medically stable to d/c.   Difficult to place patient Yes        TOTAL TIME TAKING CARE OF THIS PATIENT: 25 minutes.  >50% time spent on counselling and  coordination of care  Note: This dictation was prepared with Dragon dictation along with smaller phrase technology. Any transcriptional errors that result from this process are unintentional.  Fritzi Mandes M.D    [facility]   CC: Primary care physician; No primary care provider on file. Patient ID: Jeremy Browning, male   DOB: 08-25-1960, 60 y.o.   MRN: [id number]

## 2021-02-04 NOTE — Progress Notes (Addendum)
Physical Therapy Treatment Patient Details Name: Jeremy Browning MRN: [id number] DOB: Dec 20, 1960 Today's Date: 02/04/2021    History of Present Illness Pt is a 60 yo male that presented from home with respiratory distress 12/25/2020. Underwent defibrillation and CPR, emergently intubated. During admission pt has had a cardiac cath, peg tube and tracheostomy placed, weaned from vent 5/26 AM. 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 Mildly restricted diffusion involving the cortex more diffusely over the right cerebral convexity favored to reflect an acute MCA territory infarct over global hypoxic injury given unilaterality".    PT Comments    Pt was alert throughout treatment session, did improve over time. Able to respond to one-step commands, display appropriate emotional responses during treatment. Remains Rancho level III but demonstrates communication progression with timely, appropriate responses.  Pt able to move RUE and LLE with AAROM, but is limited with RLE due to pain. Max assistx2 for bed mobility, pt able to hold bed rail with RUE with cues during linen change and he was able to extend shoulder during pericare to reach for his RLE. Progress is demonstrated with ability to follow commands and mobility with assistance. Recommendations remain appropriate in order to maximize mobility and independence.    Follow Up Recommendations  CIR     Equipment Recommendations  None recommended by PT    Recommendations for Other Services       Precautions / Restrictions Precautions Precautions: Fall Precaution Comments: trach, PEG Restrictions Weight Bearing Restrictions: No    Mobility  Bed Mobility Overal bed mobility: Needs Assistance Bed Mobility: Rolling Rolling: Max assist;Total assist;+2 for physical assistance;+2 for safety/equipment         General bed mobility comments: Able to reach R UE to bed rail with assistance and  hold onto rail, does not initiate LUE involvement with cues    Transfers                    Ambulation/Gait                 Stairs             Wheelchair Mobility    Modified Rankin (Stroke Patients Only)       Balance                                            Cognition Arousal/Alertness: Lethargic;Awake/alert (Became altert with tx, required consistent tactile, verbal cues to maintain awake) Behavior During Therapy: [facility] for tasks assessed/performed (demonstrated agitation with R knee movement, empathy for agitiation towards therapy) Overall Cognitive Status: Impaired/Different from baseline Area of Impairment: Rancho level;Orientation               Rancho Levels of Cognitive Functioning Rancho Mirant Scales of Cognitive Functioning: Localized response                      Exercises General Exercises - Upper Extremity Shoulder Flexion: Supine;10 reps;PROM Shoulder ABduction: PROM;Both;Supine;5 reps Elbow Flexion: PROM;Both;Supine;10 reps Elbow Extension: PROM;Both;Supine;10 reps General Exercises - Lower Extremity Heel Slides: PROM;Left;5 reps;Supine Hip ABduction/ADduction: PROM;Right;Supine;5 reps Other Exercises Other Exercises: Bed mobility rolling R and L for peri-care, requires max assist, able to hold onto bed rail with R hand with cures    General Comments        Pertinent  Vitals/Pain Pain Assessment: Faces Faces Pain Scale: Hurts whole lot Pain Location: R knee - with all movement attempts Pain Descriptors / Indicators: Grimacing;Guarding;Moaning Pain Intervention(s): Limited activity within patient's tolerance;Monitored during session;Repositioned    Home Living                      Prior Function            PT Goals (current goals can now be found in the care plan section) Acute Rehab PT Goals Patient Stated Goal: none stated PT Goal Formulation: Patient unable to participate  in goal setting Time For Goal Achievement: 02/10/21 Potential to Achieve Goals: Fair Progress towards PT goals: Progressing toward goals    Frequency    Min 2X/week      PT Plan Current plan remains appropriate    Co-evaluation              AM-PAC PT "6 Clicks" Mobility   Outcome Measure  Help needed turning from your back to your side while in a flat bed without using bedrails?: Total Help needed moving from lying on your back to sitting on the side of a flat bed without using bedrails?: Total Help needed moving to and from a bed to a chair (including a wheelchair)?: Total Help needed standing up from a chair using your arms (e.g., wheelchair or bedside chair)?: Total Help needed to walk in hospital room?: Total Help needed climbing 3-5 steps with a railing? : Total 6 Click Score: 6    End of Session Equipment Utilized During Treatment: Gait belt;Other (comment) (trach) Activity Tolerance: Patient tolerated treatment well Patient left: in bed;with call bell/phone within reach;with bed alarm set   PT Visit Diagnosis: Other abnormalities of gait and mobility (R26.89);Muscle weakness (generalized) (M62.81);Other symptoms and signs involving the nervous system (R29.898)     Time: 7673-4193 PT Time Calculation (min) (ACUTE ONLY): 53 min  Charges:                        [employer], SPT

## 2021-02-04 NOTE — Progress Notes (Addendum)
   02/04/21 0908  Clinical Encounter Type  Visited With Patient  Visit Type Initial;Other (Comment);Spiritual support;Social support (First time able to speak with PT)  Referral From  (rounding)  Spiritual Encounters  Spiritual Needs Other (Comment) (not assessed)  Chaplain Burris visited briefly with PT; first time Jeremy Browning has been able to speak with me. Chaplain Burris greeted PT and inquired about his well-being. Jeremy Browning responded with interest about getting "milk and cereal" and hopes milk will be "cold"! So, good level of alertness and awareness but still quite difficult to understand speech. Will continue to follow PT and possibly ask PT to try writing to make communication easier.

## 2021-02-04 NOTE — TOC Progression Note (Signed)
Transition of Care [facility]) - Progression Note    Patient Details  Name: Jeremy Browning MRN: [id number] Date of Birth: 01-01-1961  Transition of Care [facility]) CM/SW Contact  Jeremy Ely, RN Phone Number: 02/04/2021, 1:42 PM  Clinical Narrative:  [facility]. Called spoke with Admissions Coordinator Jeremy Browning, who requested FL2, H&P, Demographics to be faxed to [phone], which was done. Jeremy Browning will call back after review with admission decision. Patients sister Jeremy Browning informed.     Expected Discharge Plan: Skilled Nursing Facility Barriers to Discharge: Continued Medical Work up  Expected Discharge Plan and Services Expected Discharge Plan: Skilled Nursing Facility In-house Referral: Clinical Social Work   Post Acute Care Choice: Skilled Nursing Facility                                         Social Determinants of Health (SDOH) Interventions    Readmission Risk Interventions No flowsheet data found.

## 2021-02-05 DIAGNOSIS — R7989 Other specified abnormal findings of blood chemistry: Secondary | ICD-10-CM | POA: Diagnosis not present

## 2021-02-05 DIAGNOSIS — N179 Acute kidney failure, unspecified: Secondary | ICD-10-CM | POA: Diagnosis not present

## 2021-02-05 LAB — CBC
HCT: 32.1 % — ABNORMAL LOW (ref 39.0–52.0)
Hemoglobin: 10.2 g/dL — ABNORMAL LOW (ref 13.0–17.0)
MCH: 24.9 pg — ABNORMAL LOW (ref 26.0–34.0)
MCHC: 31.8 g/dL (ref 30.0–36.0)
MCV: 78.5 fL — ABNORMAL LOW (ref 80.0–100.0)
Platelets: 316 10*3/uL (ref 150–400)
RBC: 4.09 MIL/uL — ABNORMAL LOW (ref 4.22–5.81)
RDW: 15 % (ref 11.5–15.5)
WBC: 12.2 10*3/uL — ABNORMAL HIGH (ref 4.0–10.5)
nRBC: 0 % (ref 0.0–0.2)

## 2021-02-05 LAB — GLUCOSE, CAPILLARY
Glucose-Capillary: 126 mg/dL — ABNORMAL HIGH (ref 70–99)
Glucose-Capillary: 128 mg/dL — ABNORMAL HIGH (ref 70–99)
Glucose-Capillary: 166 mg/dL — ABNORMAL HIGH (ref 70–99)
Glucose-Capillary: 180 mg/dL — ABNORMAL HIGH (ref 70–99)
Glucose-Capillary: 192 mg/dL — ABNORMAL HIGH (ref 70–99)
Glucose-Capillary: 235 mg/dL — ABNORMAL HIGH (ref 70–99)

## 2021-02-05 MED ORDER — ENOXAPARIN SODIUM 80 MG/0.8ML IJ SOSY
0.5000 mg/kg | PREFILLED_SYRINGE | INTRAMUSCULAR | Status: DC
  Administered 2021-02-05 – 2021-02-07 (×3): 55 mg via SUBCUTANEOUS
  Filled 2021-02-05 (×3): qty 0.8

## 2021-02-05 NOTE — Progress Notes (Signed)
Subjective:  Patient is more interactive today.  He indicates he is having less pain in his knee and demonstrates that he can actively flex and extend his knee without significant pain.  Objective:   VITALS:   Vitals:   02/05/21 0417 02/05/21 0730 02/05/21 0800 02/05/21 1137  BP: (!) 129/56 (!) 139/51 (!) 139/51 (!) 116/42  Pulse: 70 63 63 61  Resp: 20 17 18 18   Temp: 98.6 F (37 C) 97.8 F (36.6 C) 97.8 F (36.6 C) 98.4 F (36.9 C)  TempSrc: Oral     SpO2: 97% 100% 100% 100%  Weight: 110.4 kg  110 kg   Height:   6' (1.829 m)     PHYSICAL EXAM: Right lower extremity: Patient's right knee demonstrates improvement in swelling and effusion.  He has less tenderness on exam.  Distally he has boots on to avoid ulcerations.  LABS  Results for orders placed or performed during the hospital encounter of 12/25/20 (from the past 24 hour(s))  Glucose, capillary     Status: Abnormal   Collection Time: 02/04/21  4:02 PM  Result Value Ref Range   Glucose-Capillary 219 (H) 70 - 99 mg/dL  Glucose, capillary     Status: Abnormal   Collection Time: 02/04/21  7:56 PM  Result Value Ref Range   Glucose-Capillary 217 (H) 70 - 99 mg/dL  Glucose, capillary     Status: Abnormal   Collection Time: 02/04/21  9:50 PM  Result Value Ref Range   Glucose-Capillary 183 (H) 70 - 99 mg/dL  Glucose, capillary     Status: Abnormal   Collection Time: 02/05/21 12:32 AM  Result Value Ref Range   Glucose-Capillary 126 (H) 70 - 99 mg/dL  Glucose, capillary     Status: Abnormal   Collection Time: 02/05/21  4:19 AM  Result Value Ref Range   Glucose-Capillary 128 (H) 70 - 99 mg/dL  CBC     Status: Abnormal   Collection Time: 02/05/21  6:17 AM  Result Value Ref Range   WBC 12.2 (H) 4.0 - 10.5 K/uL   RBC 4.09 (L) 4.22 - 5.81 MIL/uL   Hemoglobin 10.2 (L) 13.0 - 17.0 g/dL   HCT 02/07/21 (L) 07.3 - 71.0 %   MCV 78.5 (L) 80.0 - 100.0 fL   MCH 24.9 (L) 26.0 - 34.0 pg   MCHC 31.8 30.0 - 36.0 g/dL   RDW 62.6 94.8  - 54.6 %   Platelets 316 150 - 400 K/uL   nRBC 0.0 0.0 - 0.2 %  Glucose, capillary     Status: Abnormal   Collection Time: 02/05/21  7:30 AM  Result Value Ref Range   Glucose-Capillary 166 (H) 70 - 99 mg/dL  Glucose, capillary     Status: Abnormal   Collection Time: 02/05/21 11:35 AM  Result Value Ref Range   Glucose-Capillary 180 (H) 70 - 99 mg/dL    DG Knee 1-2 Views Right  Result Date: 02/03/2021 CLINICAL DATA:  Right knee pain and swelling, no known injury, initial encounter EXAM: RIGHT KNEE - 2 VIEW COMPARISON:  None. FINDINGS: Tricompartmental degenerative changes are noted worst in the medial joint space. Small joint effusion is noted. No acute fracture or dislocation is noted. IMPRESSION: Degenerative change with small joint effusion. Electronically Signed   By: 02/05/2021 M.D.   On: 02/03/2021 16:15    Assessment/Plan: 24 Days Post-Op   Active Problems:   Cardiogenic shock [facility])   Cardiac arrest with successful resuscitation (HCC)   Flash  pulmonary edema (HCC)   Cardiopulmonary arrest with successful resuscitation (HCC)   Acute respiratory failure with hypoxia and hypercapnia (HCC)   Acute renal failure (HCC)   Transaminitis   Second degree heart block   Lactic acidosis   Aspiration pneumonia (HCC)   HFrEF (heart failure with reduced ejection fraction) (HCC)   Severe hypoxic-ischemic encephalopathy   SOB (shortness of breath)   Oropharyngeal dysphagia  Patient seems to be clinically improving.  His swelling and effusion seem to be also improved.  He demonstrates increased active knee motion today without significant pain.  He indicates that he would like to try physical therapy again.  Patient may weight-bear as tolerated on the right lower extremity and has no restrictions to range of motion with PT.  Continue steroid medication.  Will continue to follow.    Juanell Fairly , MD 02/05/2021, 1:32 PM

## 2021-02-05 NOTE — Progress Notes (Signed)
Progress Note    Jeremy Browning  DTH:[id number] DOB: 04-24-1961  DOA: 12/25/2020 PCP: No primary care provider on file.    Brief Narrative:     Medical records reviewed and are as summarized below:  Jeremy Browning is an 60 y.o. male with obesity, DM2, HTN, combined CHF who was admitted on 12/25/2020 with respiratory distress.  He was intubated in the ER also had VT requiring epi/cardioversion.  Hospital course complicated by persistent vent dependent respiratory failure eventually requiring tracheostomy, anoxic encephalopathy.  Assessment/Plan:   Active Problems:   Cardiogenic shock (HCC)   Cardiac arrest with successful resuscitation ([place])   Flash pulmonary edema ([place])   Cardiopulmonary arrest with successful resuscitation ([place])   Acute respiratory failure with hypoxia and hypercapnia (HCC)   Acute renal failure (HCC)   Transaminitis   Second degree heart block   Lactic acidosis   Aspiration pneumonia (HCC)   HFrEF (heart failure with reduced ejection fraction) (HCC)   Severe hypoxic-ischemic encephalopathy   SOB (shortness of breath)   Oropharyngeal dysphagia   Acute hypoxic respiratory failure in the setting of cardiac arrest, pulmonary edema, Proteus pneumonia  -treated while in the ICU, completed a course of antibiotics with ceftriaxone and Unasyn.  Has not required them for the past week, tolerating trach collar -Continue aggressive pulmonary toilet, continue bronchodilators -Respiratory status is stable   Cardiac arrest / V. Tach / non-STEMI / acute combined systolic and diastolic CHF /cardiogenic shock --requiring pressors while in the ICU, currently resolved. -- Cardiology consulted and underwent a cardiac cath on 5/7 which showed normal coronaries. -- A 2D echo done on 5/9 showed an EF of 45-50% with global hypokinesis of the left ventricle.  It also showed grade 1 diastolic dysfunction.  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 Recommends IV steroids for now. If no improvement he will try to inject the joint.   Elevated LFTs -noticed on 6/11, overall trending down.   --Hepatitis panel negative, right upper quadrant ultrasound negative.   --Discontinued Tylenol and statin   Anoxic brain encephalopathy, CVAs-high suspicion based on the MRI, clinically stable currently.   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  Neurology consulted and evaluated patient while hospitalized.  His left side is flaccid.  Mental status seems to be improving, he is more interactive, and has been talking more for the last couple of days   Acute kidney injury -baseline creatinine seems to be around 1.1-1.2   Anemia of chronic disease -hemoglobin stable, no bleeding   Nutrition--cont PEG feeding --6/15-- patient did not tolerate swallow study/evaluation by speech therapy   obesity Body mass index is 32.89 kg/m.   Family Communication/Anticipated D/C date and plan/Code Status   DVT prophylaxis: Lovenox ordered. Code Status: dnr Disposition Plan: Status is: Inpatient  Dispo:               Anticipated d/c is to:  tbd              Patient currently is medically stable to d/c.   Difficult to place patient Yes         Medical Consultants:   Ortho PM&R  Subjective:   Denies pain  Objective:    Vitals:   02/05/21 0417 02/05/21  0730 02/05/21 0800 02/05/21 1137  BP: (!) 129/56 (!) 139/51 (!) 139/51 (!) 116/42  Pulse: 70 63 63 61  Resp: '20 17 18 18  ' Temp: 98.6 F (37 C) 97.8 F (36.6 C) 97.8 F (36.6 C) 98.4 F (36.9 C)  TempSrc: Oral     SpO2: 97% 100% 100% 100%  Weight: 110.4 kg  110 kg   Height:   6' (1.829 m)     Intake/Output Summary (Last 24 hours) at 02/05/2021 1230 Last data filed at  02/05/2021 0800 Gross per 24 hour  Intake 832 ml  Output 300 ml  Net 532 ml   Filed Weights   02/04/21 0440 02/05/21 0417 02/05/21 0800  Weight: 111.7 kg 110.4 kg 110 kg    Exam:  General: Appearance:    Obese male in no acute distress, with trach     Lungs:     Trach, respirations unlabored  Heart:    Normal heart rate. Normal rhythm. No murmurs, rubs, or gallops.    MS:   All extremities are intact.    Neurologic:   Awake, able to make his needs known-- no pain, c/o cold     Data Reviewed:   I have personally reviewed following labs and imaging studies:  Labs: Labs show the following:   Basic Metabolic Panel: Recent Labs  Lab 01/30/21 1310 01/31/21 0525 02/02/21 1245  NA 139 141 138  K 4.3 4.6 4.1  CL 108 109 108  CO2 '26 27 24  ' GLUCOSE 168* 123* 184*  BUN 43* 39* 57*  CREATININE 1.16 1.25* 1.37*  CALCIUM 8.6* 8.7* 8.9   GFR Estimated Creatinine Clearance: 72.6 mL/min (A) (by C-G formula based on SCr of 1.37 mg/dL (H)). Liver Function Tests: Recent Labs  Lab 01/30/21 1310 01/31/21 0525 02/02/21 1245  AST 157* 114* 78*  ALT 245* 217* 199*  ALKPHOS 122 126 125  BILITOT 0.4 0.5 0.4  PROT 7.3 7.7 7.9  ALBUMIN 2.1* 2.2* 2.4*   No results for input(s): LIPASE, AMYLASE in the last 168 hours. No results for input(s): AMMONIA in the last 168 hours. Coagulation profile No results for input(s): INR, PROTIME in the last 168 hours.  CBC: Recent Labs  Lab 02/01/21 1000 02/02/21 1245 02/05/21 0617  WBC 12.7* 13.5* 12.2*  HGB 9.9* 9.7* 10.2*  HCT 32.0* 31.2* 32.1*  MCV 79.2* 80.0 78.5*  PLT 337 326 316   Cardiac Enzymes: No results for input(s): CKTOTAL, CKMB, CKMBINDEX, TROPONINI in the last 168 hours. BNP (last 3 results) No results for input(s): PROBNP in the last 8760 hours. CBG: Recent Labs  Lab 02/04/21 2150 02/05/21 0032 02/05/21 0419 02/05/21 0730 02/05/21 1135  GLUCAP 183* 126* 128* 166* 180*   D-Dimer: No results for input(s):  DDIMER in the last 72 hours. Hgb A1c: No results for input(s): HGBA1C in the last 72 hours. Lipid Profile: No results for input(s): CHOL, HDL, LDLCALC, TRIG, CHOLHDL, LDLDIRECT in the last 72 hours. Thyroid function studies: No results for input(s): TSH, T4TOTAL, T3FREE, THYROIDAB in the last 72 hours.  Invalid input(s): FREET3 Anemia work up: No results for input(s): VITAMINB12, FOLATE, FERRITIN, TIBC, IRON, RETICCTPCT in the last 72 hours. Sepsis Labs: Recent Labs  Lab 02/01/21 1000 02/02/21 1245 02/05/21 0617  WBC 12.7* 13.5* 12.2*    Microbiology No results found for this or any previous visit (from the past 240 hour(s)).  Procedures and diagnostic studies:  DG Knee 1-2 Views Right  Result Date: 02/03/2021 CLINICAL DATA:  Right  knee pain and swelling, no known injury, initial encounter EXAM: RIGHT KNEE - 2 VIEW COMPARISON:  None. FINDINGS: Tricompartmental degenerative changes are noted worst in the medial joint space. Small joint effusion is noted. No acute fracture or dislocation is noted. IMPRESSION: Degenerative change with small joint effusion. Electronically Signed   By: Inez Catalina M.D.   On: 02/03/2021 16:15    Medications:    aspirin  81 mg Per Tube Daily   carvedilol  25 mg Per Tube BID WC   chlorhexidine gluconate (MEDLINE KIT)  15 mL Mouth Rinse BID   Chlorhexidine Gluconate Cloth  6 each Topical Q0600   docusate  100 mg Per Tube BID   enoxaparin (LOVENOX) injection  0.5 mg/kg Subcutaneous Q24H   famotidine  20 mg Per Tube Daily   feeding supplement (PROSource TF)  45 mL Per Tube BID   free water  200 mL Per Tube Q6H   hydrALAZINE  50 mg Per Tube TID   insulin aspart  0-15 Units Subcutaneous TID AC & HS   mouth rinse  15 mL Mouth Rinse 10 times per day   methylPREDNISolone (SOLU-MEDROL) injection  40 mg Intravenous Q24H   polyethylene glycol  17 g Per Tube Daily   Continuous Infusions:  feeding supplement (NEPRO CARB STEADY) 1,000 mL (02/05/21 0915)      LOS: 42 days   Geradine Girt  [facility]   How to contact the [facility] Attending or Consulting provider [place] or covering provider during after hours [place], for this patient?  Check the care team in [facility] and look for a) attending/consulting TRH provider listed and b) the [facility] team listed Log into [email or web address] and use [place]'s universal password to access. If you do not have the password, please contact the hospital operator. Locate the [facility] provider you are looking for under [facility] and page to a number that you can be directly reached. If you still have difficulty reaching the provider, please page the [facility] (Director on Call) for the Hospitalists listed on amion for assistance.  02/05/2021, 12:30 PM

## 2021-02-06 ENCOUNTER — Inpatient Hospital Stay: Payer: Medicaid Other

## 2021-02-06 DIAGNOSIS — J810 Acute pulmonary edema: Secondary | ICD-10-CM | POA: Diagnosis not present

## 2021-02-06 DIAGNOSIS — I469 Cardiac arrest, cause unspecified: Secondary | ICD-10-CM | POA: Diagnosis not present

## 2021-02-06 LAB — BASIC METABOLIC PANEL
Anion gap: 6 (ref 5–15)
BUN: 61 mg/dL — ABNORMAL HIGH (ref 8–23)
CO2: 25 mmol/L (ref 22–32)
Calcium: 8.9 mg/dL (ref 8.9–10.3)
Chloride: 113 mmol/L — ABNORMAL HIGH (ref 98–111)
Creatinine, Ser: 1.26 mg/dL — ABNORMAL HIGH (ref 0.61–1.24)
GFR, Estimated: >60 mL/min (ref >60)
Glucose, Bld: 157 mg/dL — ABNORMAL HIGH (ref 70–99)
Potassium: 4 mmol/L (ref 3.5–5.1)
Sodium: 144 mmol/L (ref 135–145)

## 2021-02-06 LAB — CBC
HCT: 30.5 % — ABNORMAL LOW (ref 39.0–52.0)
Hemoglobin: 9.6 g/dL — ABNORMAL LOW (ref 13.0–17.0)
MCH: 25.1 pg — ABNORMAL LOW (ref 26.0–34.0)
MCHC: 31.5 g/dL (ref 30.0–36.0)
MCV: 79.6 fL — ABNORMAL LOW (ref 80.0–100.0)
Platelets: 328 10*3/uL (ref 150–400)
RBC: 3.83 MIL/uL — ABNORMAL LOW (ref 4.22–5.81)
RDW: 14.9 % (ref 11.5–15.5)
WBC: 12.9 10*3/uL — ABNORMAL HIGH (ref 4.0–10.5)
nRBC: 0 % (ref 0.0–0.2)

## 2021-02-06 LAB — GLUCOSE, CAPILLARY
Glucose-Capillary: 128 mg/dL — ABNORMAL HIGH (ref 70–99)
Glucose-Capillary: 135 mg/dL — ABNORMAL HIGH (ref 70–99)
Glucose-Capillary: 136 mg/dL — ABNORMAL HIGH (ref 70–99)
Glucose-Capillary: 156 mg/dL — ABNORMAL HIGH (ref 70–99)
Glucose-Capillary: 167 mg/dL — ABNORMAL HIGH (ref 70–99)
Glucose-Capillary: 203 mg/dL — ABNORMAL HIGH (ref 70–99)
Glucose-Capillary: 205 mg/dL — ABNORMAL HIGH (ref 70–99)
Glucose-Capillary: 240 mg/dL — ABNORMAL HIGH (ref 70–99)
Glucose-Capillary: 266 mg/dL — ABNORMAL HIGH (ref 70–99)

## 2021-02-06 IMAGING — DX DG ABDOMEN 1V
1 series · 1 of 1 positions shown · non-contrast
Comparison: [DATE]

CLINICAL DATA: Abdominal distension, peg tube

EXAM:
ABDOMEN - 1 VIEW

[abdomen supine]
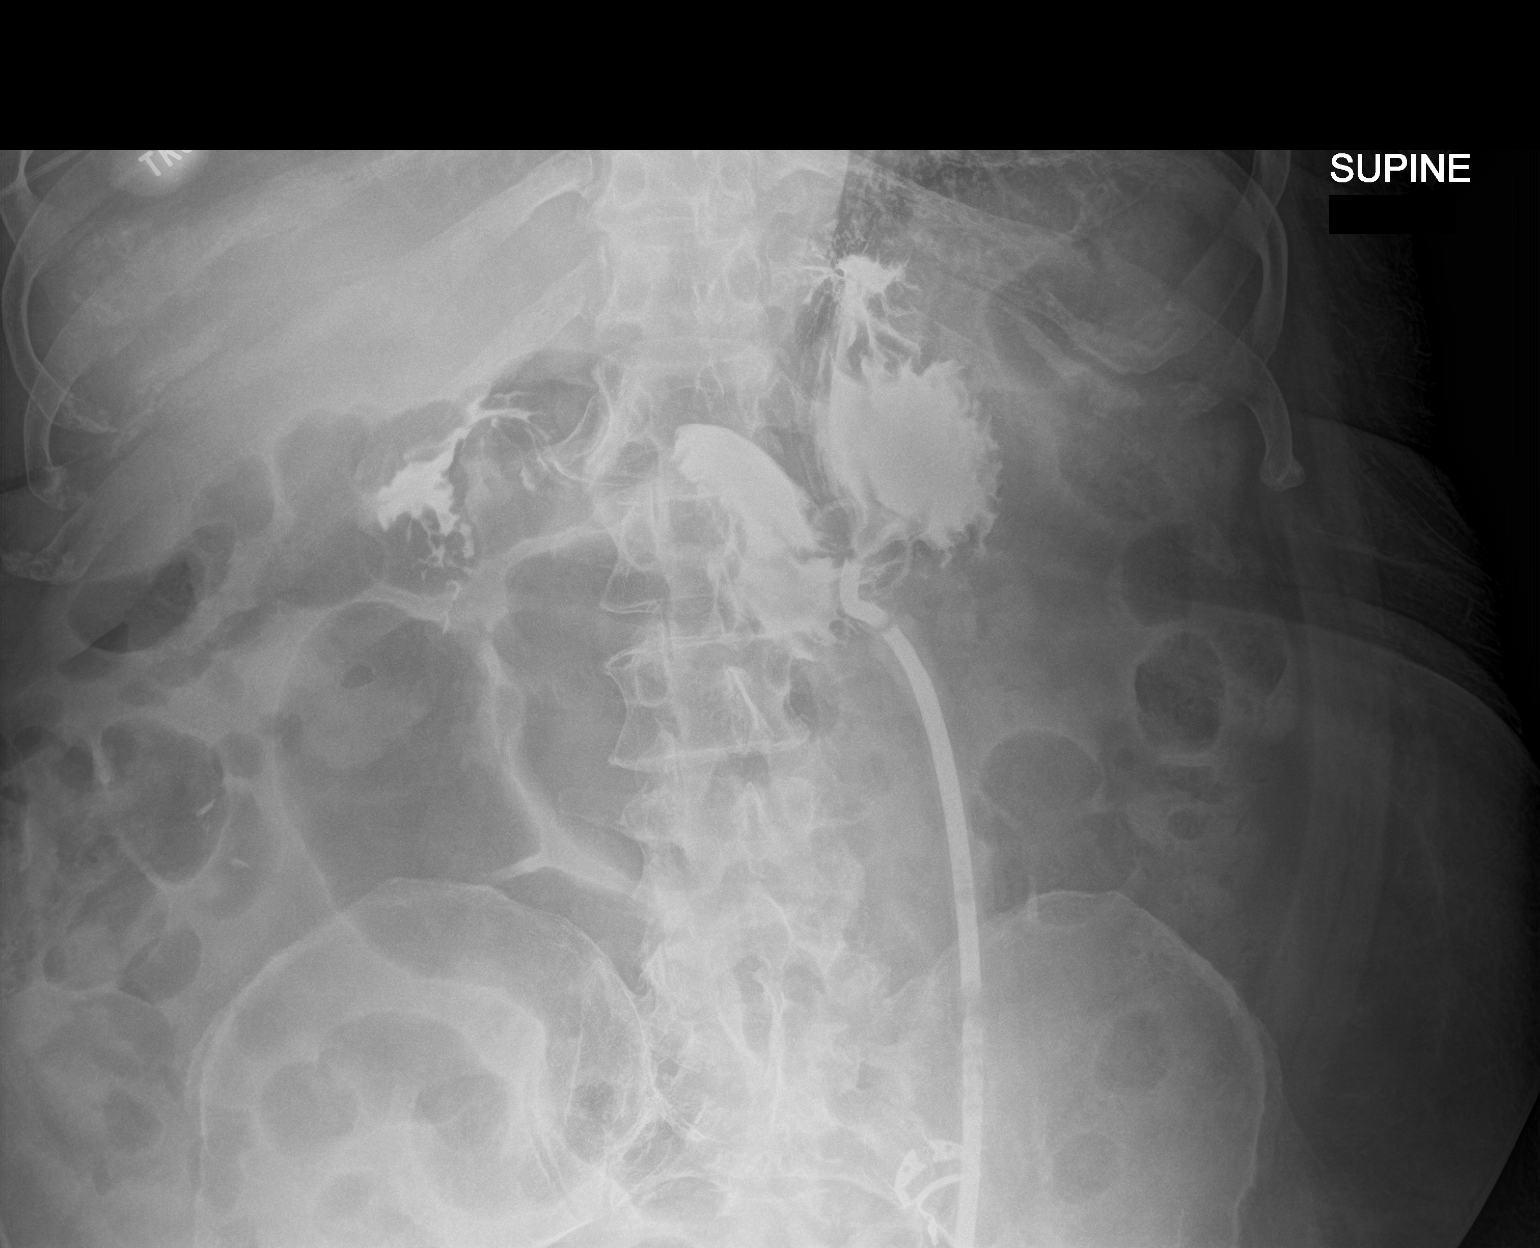

[1 of 1 positions shown; findings below may reference images not displayed]

FINDINGS: Supine frontal view of the abdomen was obtained after the
administration of oral contrast through the indwelling PEG tube by
the clinician. Contrast is seen outlining the gastric rugal folds,
with minimal emptying into the proximal duodenum. No contrast
extravasation. No evidence of obstruction.
IMPRESSION: 1. Peg tube within the gastric lumen.  No contrast extravasation.

## 2021-02-06 MED ORDER — DIATRIZOATE MEGLUMINE & SODIUM 66-10 % PO SOLN
30.0000 mL | Freq: Once | ORAL | Status: AC
  Administered 2021-02-06: 30 mL

## 2021-02-06 MED ORDER — PREDNISONE 50 MG PO TABS
50.0000 mg | ORAL_TABLET | Freq: Every day | ORAL | Status: DC
  Administered 2021-02-07: 50 mg via ORAL
  Filled 2021-02-06: qty 1

## 2021-02-06 NOTE — Progress Notes (Signed)
Progress Note    Jeremy Browning  XNA:[id number] DOB: [birth year]  DOA: 12/25/2020 PCP: No primary care provider on file.    Brief Narrative:     Medical records reviewed and are as summarized below:  Jeremy Browning is an 60 y.o. male with obesity, DM2, HTN, combined CHF who was admitted on 12/25/2020 with respiratory distress.  He was intubated in the ER also had VT requiring epi/cardioversion.  Hospital course complicated by persistent vent dependent respiratory failure eventually requiring tracheostomy, anoxic encephalopathy.Placement issue  Assessment/Plan:   Active Problems:   Cardiogenic shock (HCC)   Cardiac arrest with successful resuscitation ([place])   Flash pulmonary edema ([place])   Cardiopulmonary arrest with successful resuscitation ([place])   Acute respiratory failure with hypoxia and hypercapnia (HCC)   Acute renal failure (HCC)   Transaminitis   Second degree heart block   Lactic acidosis   Aspiration pneumonia (HCC)   HFrEF (heart failure with reduced ejection fraction) (HCC)   Severe hypoxic-ischemic encephalopathy   SOB (shortness of breath)   Oropharyngeal dysphagia   Acute hypoxic respiratory failure in the setting of cardiac arrest, pulmonary edema, Proteus pneumonia  -treated while in the ICU, completed a course of antibiotics with ceftriaxone and Unasyn.  Has not required them for the past week, tolerating trach collar -Continue aggressive pulmonary toilet, continue bronchodilators -Respiratory status is stable   Cardiac arrest / V. Tach / non-STEMI / acute combined systolic and diastolic CHF /cardiogenic shock --requiring pressors while in the ICU, currently resolved. -- Cardiology consulted and underwent a cardiac cath on 5/7 which showed normal coronaries. -- A 2D echo done on 5/9 showed an EF of 45-50% with global hypokinesis of the left ventricle.  It also showed grade 1 diastolic dysfunction.  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 Recommends IV steroids for now.-- patient improving   Elevated LFTs -noticed on 6/11, overall trending down.   --Hepatitis panel negative, right upper quadrant ultrasound negative.   --Discontinued Tylenol and statin   Anoxic brain encephalopathy, CVAs-high suspicion based on the MRI, clinically stable currently.   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  Neurology consulted and evaluated patient while hospitalized.  His left side is flaccid.  Mental status seems to be improving, he is more interactive, and has been talking more for the last couple of days   Acute kidney injury -baseline creatinine seems to be around 1.1-1.2   Anemia of chronic disease -hemoglobin stable, no bleeding   Nutrition--cont PEG feeding --6/15-- patient did not tolerate swallow study/evaluation by speech therapy   obesity Body mass index is 32.89 kg/m.   Family Communication/Anticipated D/C date and plan/Code Status   DVT prophylaxis: Lovenox ordered. Code Status: dnr Disposition Plan: Status is: Inpatient  Dispo:               Anticipated d/c is to:  tbd              Patient currently is medically stable to d/c.   Difficult to place patient Yes         Medical Consultants:   Ortho PM&R  Subjective:   No complaints   Objective:    Vitals:   02/06/21 5427 02/06/21 0441 02/06/21 0623 02/06/21 0840  BP: (!) 152/60 (!) 143/57 (!) 137/55   Pulse: 71 63 63 63  Resp:  16 17 (!) 22  Temp: 98.6 F (37 C) 98.7 F (37.1 C) 98.1 F (36.7 C)   TempSrc: Axillary     SpO2: 100% 100% 100% 98%  Weight:      Height:        Intake/Output Summary (Last 24 hours) at 02/06/2021 1107 Last data filed at 02/06/2021 1009 Gross per 24 hour  Intake 1560 ml  Output 0  ml  Net 1560 ml   Filed Weights   02/04/21 0440 02/05/21 0417 02/05/21 0800  Weight: 111.7 kg 110.4 kg 110 kg    Exam:   General: Appearance:    Obese male in no acute distress, trach in place     Lungs:     respirations unlabored  Heart:    Normal heart rate.    MS:   All extremities are intact.    Neurologic:   Awake       Data Reviewed:   I have personally reviewed following labs and imaging studies:  Labs: Labs show the following:   Basic Metabolic Panel: Recent Labs  Lab 01/30/21 1310 01/31/21 0525 02/02/21 1245 02/06/21 0556  NA 139 141 138 144  K 4.3 4.6 4.1 4.0  CL 108 109 108 113*  CO2 '26 27 24 25  ' GLUCOSE 168* 123* 184* 157*  BUN 43* 39* 57* 61*  CREATININE 1.16 1.25* 1.37* 1.26*  CALCIUM 8.6* 8.7* 8.9 8.9   GFR Estimated Creatinine Clearance: 78.9 mL/min (A) (by C-G formula based on SCr of 1.26 mg/dL (H)). Liver Function Tests: Recent Labs  Lab 01/30/21 1310 01/31/21 0525 02/02/21 1245  AST 157* 114* 78*  ALT 245* 217* 199*  ALKPHOS 122 126 125  BILITOT 0.4 0.5 0.4  PROT 7.3 7.7 7.9  ALBUMIN 2.1* 2.2* 2.4*   No results for input(s): LIPASE, AMYLASE in the last 168 hours. No results for input(s): AMMONIA in the last 168 hours. Coagulation profile No results for input(s): INR, PROTIME in the last 168 hours.  CBC: Recent Labs  Lab 02/01/21 1000 02/02/21 1245 02/05/21 0617 02/06/21 0556  WBC 12.7* 13.5* 12.2* 12.9*  HGB 9.9* 9.7* 10.2* 9.6*  HCT 32.0* 31.2* 32.1* 30.5*  MCV 79.2* 80.0 78.5* 79.6*  PLT 337 326 316 328   Cardiac Enzymes: No results for input(s): CKTOTAL, CKMB, CKMBINDEX, TROPONINI in the last 168 hours. BNP (last 3 results) No results for input(s): PROBNP in the last 8760 hours. CBG: Recent Labs  Lab 02/05/21 2221 02/06/21 0036 02/06/21 0054 02/06/21 0441 02/06/21 0732  GLUCAP 192* 156* 135* 136* 167*   D-Dimer: No results for input(s): DDIMER in the last 72 hours. Hgb A1c: No results for input(s):  HGBA1C in the last 72 hours. Lipid Profile: No results for input(s): CHOL, HDL, LDLCALC, TRIG, CHOLHDL, LDLDIRECT in the last 72 hours. Thyroid function studies: No results for input(s): TSH, T4TOTAL, T3FREE, THYROIDAB in the last 72 hours.  Invalid input(s): FREET3 Anemia work up: No results for input(s): VITAMINB12, FOLATE, FERRITIN, TIBC, IRON, RETICCTPCT in the last 72 hours. Sepsis Labs: Recent Labs  Lab 02/01/21 1000 02/02/21 1245 02/05/21 0617 02/06/21 0556  WBC 12.7* 13.5* 12.2* 12.9*    Microbiology No results found for this or any previous visit (from the past 240 hour(s)).  Procedures and diagnostic studies:  DG Abd 1 View  Result Date: 02/06/2021 CLINICAL DATA:  Abdominal distension, peg tube EXAM: ABDOMEN - 1 VIEW  COMPARISON:  01/07/2021 FINDINGS: Supine frontal view of the abdomen was obtained after the administration of oral contrast through the indwelling PEG tube by the clinician. Contrast is seen outlining the gastric rugal folds, with minimal emptying into the proximal duodenum. No contrast extravasation. No evidence of obstruction. IMPRESSION: 1. Peg tube within the gastric lumen.  No contrast extravasation. Electronically Signed   By: Randa Ngo M.D.   On: 02/06/2021 02:08    Medications:    aspirin  81 mg Per Tube Daily   carvedilol  25 mg Per Tube BID WC   chlorhexidine gluconate (MEDLINE KIT)  15 mL Mouth Rinse BID   Chlorhexidine Gluconate Cloth  6 each Topical Q0600   docusate  100 mg Per Tube BID   enoxaparin (LOVENOX) injection  0.5 mg/kg Subcutaneous Q24H   famotidine  20 mg Per Tube Daily   feeding supplement (PROSource TF)  45 mL Per Tube BID   free water  200 mL Per Tube Q6H   hydrALAZINE  50 mg Per Tube TID   insulin aspart  0-15 Units Subcutaneous TID AC & HS   mouth rinse  15 mL Mouth Rinse 10 times per day   methylPREDNISolone (SOLU-MEDROL) injection  40 mg Intravenous Q24H   polyethylene glycol  17 g Per Tube Daily   Continuous  Infusions:  feeding supplement (NEPRO CARB STEADY) 60 mL/hr at 02/05/21 1400     LOS: 86 days   Geradine Girt  [facility]   How to contact the [facility] Attending or Consulting provider [place] or covering provider during after hours [place], for this patient?  Check the care team in [facility] and look for a) attending/consulting TRH provider listed and b) the [facility] team listed Log into [email or web address] and use [place]'s universal password to access. If you do not have the password, please contact the hospital operator. Locate the [facility] provider you are looking for under [facility] and page to a number that you can be directly reached. If you still have difficulty reaching the provider, please page the [facility] (Director on Call) for the Hospitalists listed on amion for assistance.  02/06/2021, 11:07 AM

## 2021-02-07 ENCOUNTER — Inpatient Hospital Stay: Payer: Medicaid Other

## 2021-02-07 DIAGNOSIS — I469 Cardiac arrest, cause unspecified: Secondary | ICD-10-CM | POA: Diagnosis not present

## 2021-02-07 LAB — GLUCOSE, CAPILLARY
Glucose-Capillary: 130 mg/dL — ABNORMAL HIGH (ref 70–99)
Glucose-Capillary: 196 mg/dL — ABNORMAL HIGH (ref 70–99)
Glucose-Capillary: 193 mg/dL — ABNORMAL HIGH (ref 70–99)
Glucose-Capillary: 260 mg/dL — ABNORMAL HIGH (ref 70–99)
Glucose-Capillary: 173 mg/dL — ABNORMAL HIGH (ref 70–99)

## 2021-02-07 IMAGING — DX DG CHEST 1V PORT
1 series · 1 of 1 positions shown · non-contrast
Comparison: [DATE]

CLINICAL DATA: Fever

EXAM:
PORTABLE CHEST 1 VIEW

[chest ap]
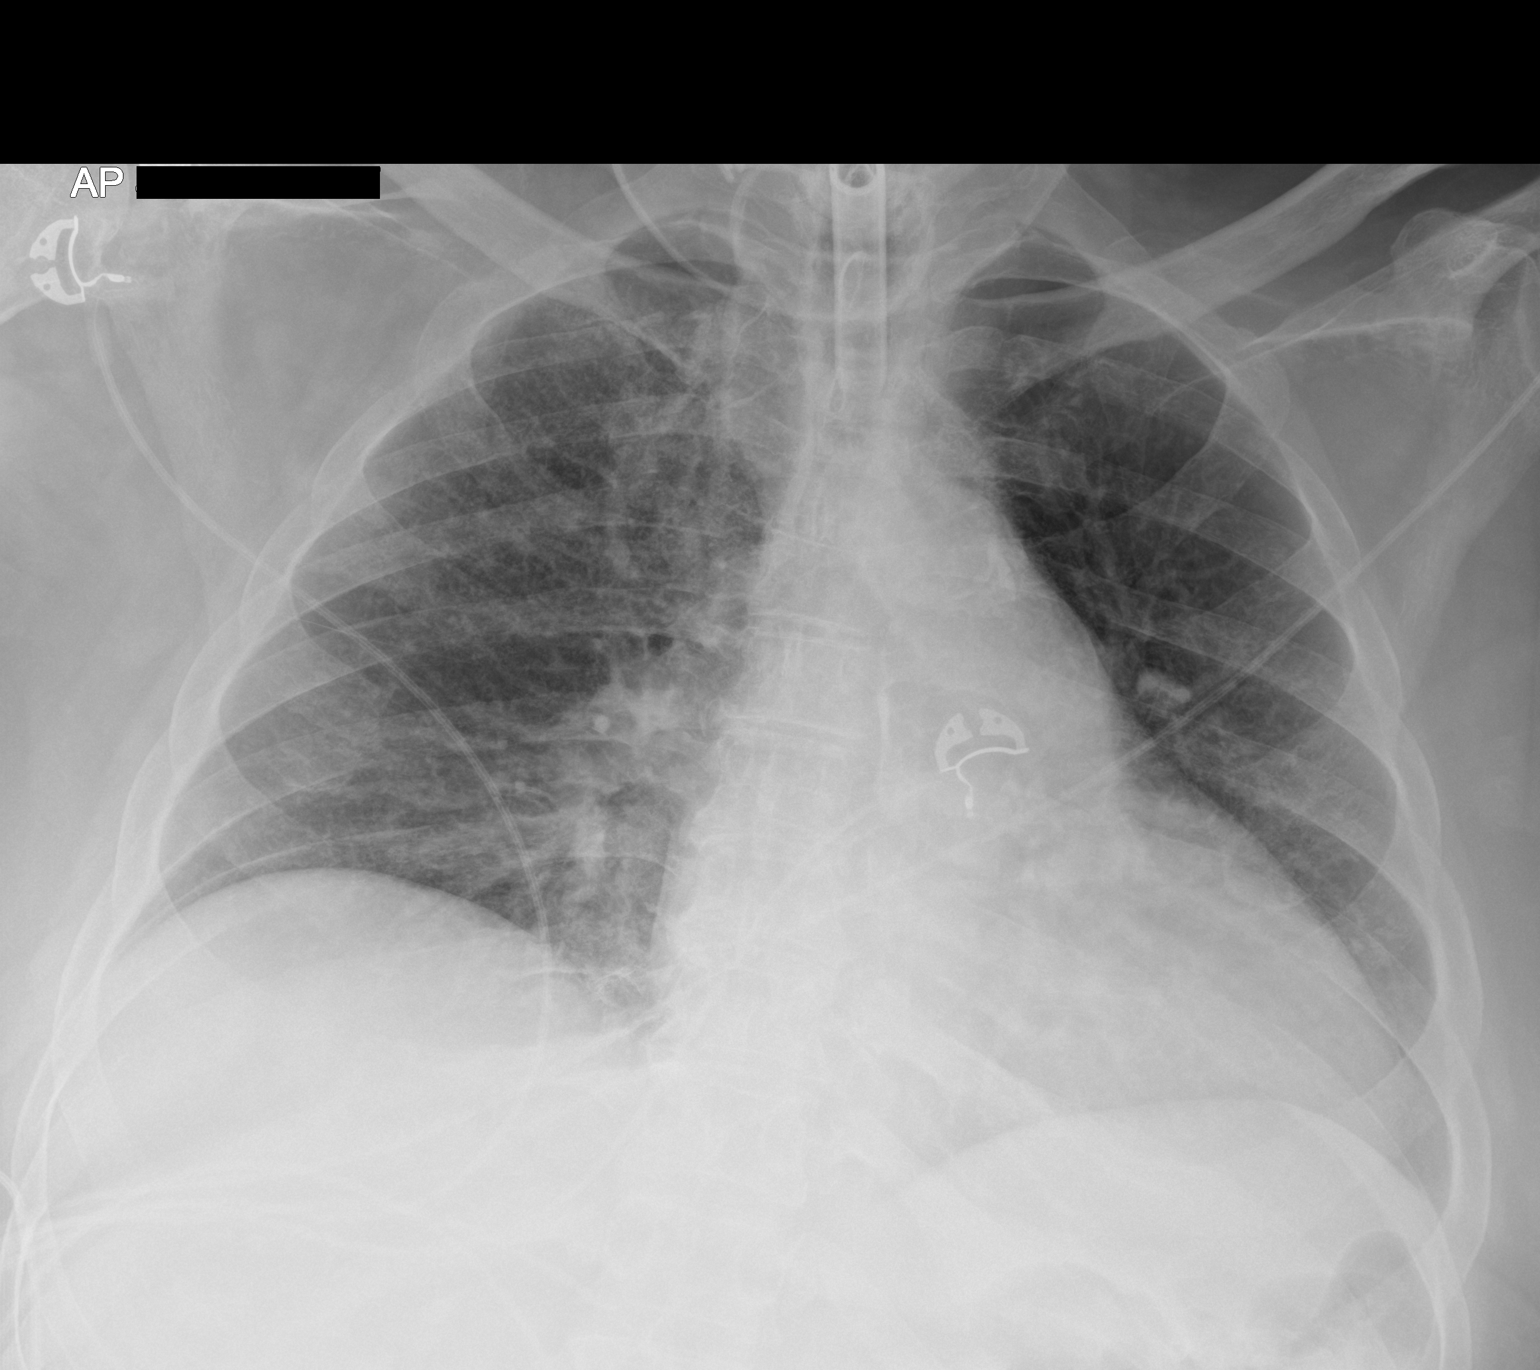

[1 of 1 positions shown; findings below may reference images not displayed]

FINDINGS: UPPER limits normal heart size and tracheostomy tube again noted.

Peribronchial thickening is again identified.

There is no evidence of focal airspace disease, pulmonary edema,
suspicious pulmonary nodule/mass, pleural effusion, or pneumothorax.

No acute bony abnormalities are identified.
IMPRESSION: No active disease.

## 2021-02-07 MED ORDER — PREDNISONE 20 MG PO TABS
40.0000 mg | ORAL_TABLET | Freq: Every day | ORAL | Status: DC
  Administered 2021-02-08 – 2021-02-25 (×18): 40 mg
  Filled 2021-02-07 (×18): qty 2

## 2021-02-07 MED ORDER — PREDNISONE 50 MG PO TABS: 50.0000 mg | ORAL_TABLET | Freq: Every day | ORAL | Status: DC

## 2021-02-07 MED ORDER — SODIUM CHLORIDE 0.9 % IV SOLN: INTRAVENOUS | Status: DC

## 2021-02-07 MED ORDER — BLISTEX MEDICATED EX OINT
TOPICAL_OINTMENT | CUTANEOUS | Status: DC | PRN
  Filled 2021-02-07: qty 6.3

## 2021-02-07 MED ORDER — DOCUSATE SODIUM 50 MG/5ML PO LIQD
100.0000 mg | Freq: Two times a day (BID) | ORAL | Status: DC | PRN
  Administered 2021-02-26: 100 mg
  Filled 2021-02-07 (×2): qty 10

## 2021-02-07 MED ORDER — ENOXAPARIN SODIUM 60 MG/0.6ML IJ SOSY
0.5000 mg/kg | PREFILLED_SYRINGE | INTRAMUSCULAR | Status: DC
  Administered 2021-02-08 – 2021-03-08 (×29): 52.5 mg via SUBCUTANEOUS
  Filled 2021-02-07 (×4): qty 0.53
  Filled 2021-02-07 (×2): qty 0.6
  Filled 2021-02-07: qty 0.53
  Filled 2021-02-07: qty 0.6
  Filled 2021-02-07 (×5): qty 0.53
  Filled 2021-02-07 (×2): qty 0.6
  Filled 2021-02-07 (×4): qty 0.53
  Filled 2021-02-07: qty 0.6
  Filled 2021-02-07 (×3): qty 0.53
  Filled 2021-02-07 (×3): qty 0.6
  Filled 2021-02-07: qty 0.53
  Filled 2021-02-07 (×2): qty 0.6

## 2021-02-07 NOTE — Progress Notes (Signed)
Unable to obtain urine sample d/t patient removing condom cath and urinating in bed. Will pass along in report to night shift that is needs to be obtained. MD Benjamine Mola made aware via secure chat.

## 2021-02-07 NOTE — Evaluation (Signed)
Occupational Therapy Re-Evaluation Patient Details Name: Jeremy Browning MRN: [id number] DOB: 04-30-1961 Today's Date: 02/07/2021    History of Present Illness Pt is a 60 yo male that presented from home with respiratory distress 12/25/2020. Underwent defibrillation and CPR, emergently intubated. During admission pt has had a cardiac cath, peg tube and tracheostomy placed, weaned from vent 5/26 AM. 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 Mildly restricted diffusion involving the cortex more diffusely over the right cerebral convexity favored to reflect an acute MCA territory infarct over global hypoxic injury given unilaterality".   Clinical Impression   Pt making progress towards occupational therapy goals. Pt progressing to RLAS IV this session. Upon entering the room, pt supine in bed and agreeable to OT intervention. Pt greets therapist and calls this therapist by name throughout session. Pt is agreeable to OT intervention and communicating needs clearly. Pt reports his mouth is dry and is able to use swab with oral suction to clean mouth and apply oral mouth moisture gel with set up A and min cuing and use of R UE. Pt also verbalizing her was hot and needing a cold cloth for head. OT applied per his request. L UE PROM and AAROM exercises with pt needing mod multimodal cuing to attend to L UE for exercises. Pt moving following commands to move R LE off EOB with increased time and mod multimodal cuing. OT assisted with L LE and trunk. Pt sitting EOB with min - mod sitting balance. Pt does become fearful and states, " Gustabo Gordillo don't let me fall. That would not be good." Pt does begin to push to the R once on EOB and therapist forcing R UE into lap and also having pt lean to R onto elbow and then returning to midline several times with mod A. OT also attempting to place L UE into weight bearing position on EOB but after ~11 minutes pt began to fatigue and  requesting to return to bed. Pt directing trunk and needing A with B LEs. B prevalon boots donned and pt rolling L <> R with mod A to change chux pad. All needs within reach upon exiting the room. Pt thanking therapist and resting peacefully in bed. Bed alarm activated and all needs within reach. Pt making excellent progress and additional goals added to pt's POC for functional transfers and self care tasks.     Follow Up Recommendations  CIR;Supervision/Assistance - 24 hour    Equipment Recommendations  Other (comment) (differ to next venue of care)       Precautions / Restrictions Precautions Precautions: Fall Precaution Comments: trach, PEG      Mobility Bed Mobility Overal bed mobility: Needs Assistance Bed Mobility: Rolling;Supine to Sit;Sit to Supine Rolling: Mod assist   Supine to sit: Max assist Sit to supine: Max assist   General bed mobility comments: Pt follows commands to move R LE off bed and needing assist with B LEs to return to bed    Transfers                      Balance Overall balance assessment: Needs assistance Sitting-balance support: Bilateral upper extremity supported;Feet supported Sitting balance-Leahy Scale: Poor Sitting balance - Comments: R pusher with mod sitting balance on EOB Postural control: Left lateral lean  ADL either performed or assessed with clinical judgement   ADL Overall ADL's : Needs assistance/impaired     Grooming: Wash/dry face;Set up;Supervision/safety;Oral care                                       Vision Patient Visual Report: No change from baseline              Pertinent Vitals/Pain Pain Assessment: Faces Faces Pain Scale: No hurt           Communication     Cognition Arousal/Alertness: Awake/alert Behavior During Therapy: Flat affect Overall Cognitive Status: Impaired/Different from baseline Area of Impairment: Rancho  level;Orientation               Rancho Levels of Cognitive Functioning Ranch[place] Los Amigos Scales of Cognitive Functioning: Confused/agitated Orientation Level: Disoriented to;Place;Time;Situation                   OT Goals(Current goals can be found in the care plan section) Acute Rehab OT Goals Patient Stated Goal: none stated OT Goal Formulation: With patient Time For Goal Achievement: 02/21/21 Potential to Achieve Goals: Fair ADL Goals Pt Will Perform Upper Body Dressing: with min assist;sitting Pt Will Transfer to Toilet: with max assist;bedside commode  OT Frequency: Min 2X/week    AM-PAC OT "6 Clicks" Daily Activity     Outcome Measure Help from another person eating meals?: Total Help from another person taking care of personal grooming?: A Little Help from another person toileting, which includes using toliet, bedpan, or urinal?: Total Help from another person bathing (including washing, rinsing, drying)?: A Lot Help from another person to put on and taking off regular upper body clothing?: A Lot Help from another person to put on and taking off regular lower body clothing?: Total 6 Click Score: 10   End of Session Equipment Utilized During Treatment: Oxygen;Other (comment) (trach) Nurse Communication: Mobility status  Activity Tolerance: Patient tolerated treatment well Patient left: in bed;with call bell/phone within reach;with bed alarm set  OT Visit Diagnosis: Other symptoms and signs involving the nervous system (R29.898);Other symptoms and signs involving cognitive function                Time: 5885-0277 OT Time Calculation (min): 38 min Charges:  OT General Charges $OT Visit: 1 Visit OT Evaluation $OT Re-eval: 1 Re-eval OT Treatments $Self Care/Home Management : 8-22 mins $Neuromuscular Re-education: 23-37 mins  Jackquline Denmark, MS, OTR/L , CBIS ascom [phone]  02/07/21, 4:18 PM

## 2021-02-07 NOTE — Progress Notes (Addendum)
Progress Note    Lonie Newsham  VOZ:[id number] DOB: 30-Mar-1961  DOA: 12/25/2020 PCP: No primary care provider on file.    Brief Narrative:     Medical records reviewed and are as summarized below:  Guage Efferson is an 60 y.o. male with obesity, DM2, HTN, combined CHF who was admitted on 12/25/2020 with respiratory distress.  He was intubated in the ER also had VT requiring epi/cardioversion.  Hospital course complicated by persistent vent dependent respiratory failure eventually requiring tracheostomy, anoxic encephalopathy.Placement issue  Assessment/Plan:   Active Problems:   Cardiogenic shock (HCC)   Cardiac arrest with successful resuscitation ([place])   Flash pulmonary edema ([place])   Cardiopulmonary arrest with successful resuscitation ([place])   Acute respiratory failure with hypoxia and hypercapnia (HCC)   Acute renal failure (HCC)   Transaminitis   Second degree heart block   Lactic acidosis   Aspiration pneumonia (HCC)   HFrEF (heart failure with reduced ejection fraction) (HCC)   Severe hypoxic-ischemic encephalopathy   SOB (shortness of breath)   Oropharyngeal dysphagia   Acute hypoxic respiratory failure in the setting of cardiac arrest, pulmonary edema, Proteus pneumonia  -treated while in the ICU, completed a course of antibiotics with ceftriaxone and Unasyn.  Has not required them for the past week, tolerating trach collar -Continue aggressive pulmonary toilet, continue bronchodilators -Respiratory status is stable   Cardiac arrest / V. Tach / non-STEMI / acute combined systolic and diastolic CHF /cardiogenic shock --requiring pressors while in the ICU, currently resolved. -- Cardiology consulted and underwent a cardiac cath on 5/7 which showed normal coronaries. -- A 2D echo done on 5/9 showed an EF of 45-50% with global hypokinesis of the left ventricle.  It also showed grade 1 diastolic dysfunction.  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 Recommends  steroids for now.-- patient improving- start to wean down   Elevated LFTs -noticed on 6/11, overall trending down.   --Hepatitis panel negative, right upper quadrant ultrasound negative.   --Discontinued Tylenol and statin   Anoxic brain encephalopathy, CVAs-high suspicion based on the MRI, clinically stable currently.   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  Neurology consulted and evaluated patient while hospitalized.  His left side is flaccid.  Mental status seems to be improving, he is more interactive, and has been talking more for the last couple of days   Acute kidney injury -baseline creatinine seems to be around 1.1-1.2   Anemia of chronic disease -hemoglobin stable, no bleeding   Nutrition--cont PEG feeding --6/15-- patient did not tolerate swallow study/evaluation by speech therapy   obesity Body mass index is 31.57 kg/m.  Fever (new 6/20) -cultures, urine and x ray ordered   Family Communication/Anticipated D/C date and plan/Code Status   DVT prophylaxis: Lovenox ordered. Code Status: dnr Disposition Plan: Status is: Inpatient  Dispo:               Anticipated d/c is to:  tbd              Patient currently is medically stable to d/c.   Difficult to place patient Yes         Medical Consultants:   Ortho CIR  Subjective:   C/o dry mouth and wanting to  eat something  Objective:    Vitals:   02/06/21 1600 02/06/21 1700 02/06/21 2016 02/07/21 0418  BP:   (!) 132/49 (!) 154/68  Pulse:   65 64  Resp:   20 18  Temp:   98.3 F (36.8 C) 99.5 F (37.5 C)  TempSrc:   Oral   SpO2:   98% 98%  Weight: 106.3 kg 106.3 kg  105.6 kg  Height:        Intake/Output Summary (Last 24 hours) at 02/07/2021  1113 Last data filed at 02/07/2021 5170 Gross per 24 hour  Intake 2220 ml  Output 1 ml  Net 2219 ml   Filed Weights   02/06/21 1600 02/06/21 1700 02/07/21 0418  Weight: 106.3 kg 106.3 kg 105.6 kg    Exam:   General: Appearance:    Obese male with dry peeling lips     Lungs:     respirations unlabored, on trach  Heart:    Normal heart rate.       Neurologic:   Awake, alert, able to make his needs known        Data Reviewed:   I have personally reviewed following labs and imaging studies:  Labs: Labs show the following:   Basic Metabolic Panel: Recent Labs  Lab 02/02/21 1245 02/06/21 0556  NA 138 144  K 4.1 4.0  CL 108 113*  CO2 24 25  GLUCOSE 184* 157*  BUN 57* 61*  CREATININE 1.37* 1.26*  CALCIUM 8.9 8.9   GFR Estimated Creatinine Clearance: 77.3 mL/min (A) (by C-G formula based on SCr of 1.26 mg/dL (H)). Liver Function Tests: Recent Labs  Lab 02/02/21 1245  AST 78*  ALT 199*  ALKPHOS 125  BILITOT 0.4  PROT 7.9  ALBUMIN 2.4*   No results for input(s): LIPASE, AMYLASE in the last 168 hours. No results for input(s): AMMONIA in the last 168 hours. Coagulation profile No results for input(s): INR, PROTIME in the last 168 hours.  CBC: Recent Labs  Lab 02/01/21 1000 02/02/21 1245 02/05/21 0617 02/06/21 0556  WBC 12.7* 13.5* 12.2* 12.9*  HGB 9.9* 9.7* 10.2* 9.6*  HCT 32.0* 31.2* 32.1* 30.5*  MCV 79.2* 80.0 78.5* 79.6*  PLT 337 326 316 328   Cardiac Enzymes: No results for input(s): CKTOTAL, CKMB, CKMBINDEX, TROPONINI in the last 168 hours. BNP (last 3 results) No results for input(s): PROBNP in the last 8760 hours. CBG: Recent Labs  Lab 02/06/21 1655 02/06/21 2018 02/06/21 2337 02/07/21 0424 02/07/21 0806  GLUCAP 266* 203* 128* 130* 196*   D-Dimer: No results for input(s): DDIMER in the last 72 hours. Hgb A1c: No results for input(s): HGBA1C in the last 72 hours. Lipid Profile: No results for input(s): CHOL, HDL, LDLCALC, TRIG,  CHOLHDL, LDLDIRECT in the last 72 hours. Thyroid function studies: No results for input(s): TSH, T4TOTAL, T3FREE, THYROIDAB in the last 72 hours.  Invalid input(s): FREET3 Anemia work up: No results for input(s): VITAMINB12, FOLATE, FERRITIN, TIBC, IRON, RETICCTPCT in the last 72 hours. Sepsis Labs: Recent Labs  Lab 02/01/21 1000 02/02/21 1245 02/05/21 0617 02/06/21 0556  WBC 12.7* 13.5* 12.2* 12.9*    Microbiology No results found for this or any previous visit (from the past 240 hour(s)).  Procedures and diagnostic studies:  DG Abd 1 View  Result Date: 02/06/2021 CLINICAL DATA:  Abdominal distension, peg tube EXAM: ABDOMEN - 1 VIEW COMPARISON:  01/07/2021 FINDINGS: Supine frontal view of the abdomen was obtained after the administration of oral contrast through  the indwelling PEG tube by the clinician. Contrast is seen outlining the gastric rugal folds, with minimal emptying into the proximal duodenum. No contrast extravasation. No evidence of obstruction. IMPRESSION: 1. Peg tube within the gastric lumen.  No contrast extravasation. Electronically Signed   By: Randa Ngo M.D.   On: 02/06/2021 02:08    Medications:    aspirin  81 mg Per Tube Daily   carvedilol  25 mg Per Tube BID WC   chlorhexidine gluconate (MEDLINE KIT)  15 mL Mouth Rinse BID   Chlorhexidine Gluconate Cloth  6 each Topical Q0600   docusate  100 mg Per Tube BID   enoxaparin (LOVENOX) injection  0.5 mg/kg Subcutaneous Q24H   famotidine  20 mg Per Tube Daily   feeding supplement (PROSource TF)  45 mL Per Tube BID   free water  200 mL Per Tube Q6H   hydrALAZINE  50 mg Per Tube TID   insulin aspart  0-15 Units Subcutaneous TID AC & HS   mouth rinse  15 mL Mouth Rinse 10 times per day   polyethylene glycol  17 g Per Tube Daily   [START ON 02/08/2021] predniSONE  50 mg Per Tube Q breakfast   Continuous Infusions:  feeding supplement (NEPRO CARB STEADY) 1,000 mL (02/07/21 0145)     LOS: 78 days    Geradine Girt  [facility]   How to contact the [facility] Attending or Consulting provider [place] or covering provider during after hours [place], for this patient?  Check the care team in [facility] and look for a) attending/consulting [facility] provider listed and b) the [facility] team listed Log into [email or web address] and use [place]'s universal password to access. If you do not have the password, please contact the hospital operator. Locate the [facility] provider you are looking for under [facility] and page to a number that you can be directly reached. If you still have difficulty reaching the provider, please page the [facility] (Director on Call) for the Hospitalists listed on amion for assistance.  02/07/2021, 11:13 AM

## 2021-02-08 ENCOUNTER — Inpatient Hospital Stay: Payer: Medicaid Other

## 2021-02-08 DIAGNOSIS — M25561 Pain in right knee: Secondary | ICD-10-CM

## 2021-02-08 LAB — CBC
HCT: 34.2 % — ABNORMAL LOW (ref 39.0–52.0)
Hemoglobin: 10.7 g/dL — ABNORMAL LOW (ref 13.0–17.0)
MCH: 24.4 pg — ABNORMAL LOW (ref 26.0–34.0)
MCHC: 31.3 g/dL (ref 30.0–36.0)
MCV: 78.1 fL — ABNORMAL LOW (ref 80.0–100.0)
Platelets: 349 10*3/uL (ref 150–400)
RBC: 4.38 MIL/uL (ref 4.22–5.81)
RDW: 15.1 % (ref 11.5–15.5)
WBC: 16 10*3/uL — ABNORMAL HIGH (ref 4.0–10.5)
nRBC: 0 % (ref 0.0–0.2)

## 2021-02-08 LAB — GLUCOSE, CAPILLARY
Glucose-Capillary: 147 mg/dL — ABNORMAL HIGH (ref 70–99)
Glucose-Capillary: 176 mg/dL — ABNORMAL HIGH (ref 70–99)
Glucose-Capillary: 178 mg/dL — ABNORMAL HIGH (ref 70–99)
Glucose-Capillary: 179 mg/dL — ABNORMAL HIGH (ref 70–99)
Glucose-Capillary: 197 mg/dL — ABNORMAL HIGH (ref 70–99)
Glucose-Capillary: 230 mg/dL — ABNORMAL HIGH (ref 70–99)

## 2021-02-08 LAB — BASIC METABOLIC PANEL
Anion gap: 9 (ref 5–15)
BUN: 58 mg/dL — ABNORMAL HIGH (ref 8–23)
CO2: 23 mmol/L (ref 22–32)
Calcium: 8.7 mg/dL — ABNORMAL LOW (ref 8.9–10.3)
Chloride: 109 mmol/L (ref 98–111)
Creatinine, Ser: 1.26 mg/dL — ABNORMAL HIGH (ref 0.61–1.24)
GFR, Estimated: >60 mL/min (ref >60)
Glucose, Bld: 179 mg/dL — ABNORMAL HIGH (ref 70–99)
Potassium: 4.1 mmol/L (ref 3.5–5.1)
Sodium: 141 mmol/L (ref 135–145)

## 2021-02-08 MED ORDER — DICLOFENAC SODIUM 1 % EX GEL
2.0000 g | Freq: Four times a day (QID) | CUTANEOUS | Status: DC
  Administered 2021-02-08 – 2021-03-08 (×107): 2 g via TOPICAL
  Filled 2021-02-08 (×3): qty 100

## 2021-02-08 MED ORDER — NEPRO/CARBSTEADY PO LIQD
1000.0000 mL | ORAL | Status: DC
  Administered 2021-02-08 – 2021-02-09 (×3): 1000 mL

## 2021-02-08 NOTE — Progress Notes (Signed)
Modified Barium Swallow Progress Note  Patient Details  Name: Jeremy Browning MRN: [id number] Date of Birth: 1961/03/23  Today's Date: 02/08/2021  Modified Barium Swallow completed.  Full report located under Chart Review in the Imaging Section.  Brief recommendations include the following:  Clinical Impression  Pt presents with moderate oral phase and mild pharyngeal phase dysphagia. Pt's oral phase deficits appear to be multifactorial related to current cognitive status as well as left oral motor weakness. He is currently a Rancho Level IV and has difficulty following specific oral motor directions. Additionally, he has a #6 cuffless Shiley with PMSV in place. Functionally he has left facial weakness and likely decreased lingual strength/ROM. Orally, when consuming thin liquids (via spoon, cup, straw) and nectar thick liquids (via spoon and cup), pt has gross left anterior spillage, reduced bolus cohesion resulting in premature spillage and lingual palatal residue.  He had occasional piecemeal swallowing with thin liquids via cup. Pt's oral phase was appropriate when consuming puree and mildly prolonged with dysphagia 2 textures. He has multiple swallows to clear oral cavity of dysphagia 2 items. Pt's pharyngeal phase appears sensory related as his swallow is initiated at the pyriform sinuses with penetration of thin liquids when consuming multiple swallows via straw. Penetrates were expelled during swallow with exception x 1 when residue remained in laryngeal vestibule (PAS 3). Of note, pt has been NPO for 46 days and his oropharyngeal abilities improved as study progressed and ongoing use of swallow musculature. At this time, recommend a conservative diet of dysphagia 1 with thin liquids via cup with trials of dysphagia 2 at bedside with SLP. While pt is able to consume medicine crushed in puree, recommend continued administration via alternative means at this time. Pt requires total assistance with  self-feeding and should have FULL NURSING SUPERVISION during all PO intake.   Swallow Evaluation Recommendations       SLP Diet Recommendations: Dysphagia 1 (Puree) solids;Thin liquid   Liquid Administration via: Cup   Medication Administration: Via alternative means   Supervision: Full assist for feeding;Staff to assist with self feeding;Full supervision/cueing for compensatory strategies   Compensations: Minimize environmental distractions;Slow rate;Small sips/bites   Postural Changes: Seated upright at 90 degrees   Oral Care Recommendations: Oral care BID   Other Recommendations: Have oral suction available   Georgann Bramble B. Dreama Saa M.S., CCC-SLP, [facility] Speech-Language Pathologist Rehabilitation Services Office [phone]  Khanh Cordner Dreama Saa 02/08/2021,9:08 PM

## 2021-02-08 NOTE — Progress Notes (Addendum)
Progress Note    Jeremy Browning  OEH:[id number] DOB: 19-Jul-1961  DOA: 12/25/2020 PCP: No primary care provider on file.    Brief Narrative:     Medical records reviewed and are as summarized below:  Jeremy Browning is an 60 y.o. male with obesity, DM2, HTN, combined CHF who was admitted on 12/25/2020 with respiratory distress.  He was intubated in the ER also had VT requiring epi/cardioversion.  Hospital course complicated by persistent vent dependent respiratory failure eventually requiring tracheostomy, anoxic encephalopathy.Placement issue.  Most recent issue was pain in right knee.    Assessment/Plan:   Active Problems:   Cardiogenic shock (HCC)   Cardiac arrest with successful resuscitation ([place])   Flash pulmonary edema ([place])   Cardiopulmonary arrest with successful resuscitation ([place])   Acute respiratory failure with hypoxia and hypercapnia (HCC)   Acute renal failure (HCC)   Transaminitis   Second degree heart block   Lactic acidosis   Aspiration pneumonia (HCC)   HFrEF (heart failure with reduced ejection fraction) (HCC)   Severe hypoxic-ischemic encephalopathy   SOB (shortness of breath)   Oropharyngeal dysphagia   Acute hypoxic respiratory failure in the setting of cardiac arrest, pulmonary edema, Proteus pneumonia  -treated while in the ICU, completed a course of antibiotics with ceftriaxone and Unasyn.  Has not required them for the past week, tolerating trach collar -Continue aggressive pulmonary toilet, continue bronchodilators -Respiratory status is stable   Cardiac arrest / V. Tach / non-STEMI / acute combined systolic and diastolic CHF /cardiogenic shock --requiring pressors while in the ICU, currently resolved. -- Cardiology consulted and underwent a cardiac cath on 5/7 which showed normal coronaries. -- A 2D echo done on 5/9 showed an EF of 45-50% with global hypokinesis of the left ventricle.  It also showed grade 1 diastolic dysfunction.  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 Recommends  steroids for now.-- start to wean down as the IV steroids appeared to cause some psychosis/behavioral issues   Elevated LFTs -noticed on 6/11, overall trending down.   --Hepatitis panel negative, right upper quadrant ultrasound negative.   --Discontinued Tylenol and statin   Anoxic brain encephalopathy, CVAs-high suspicion based on the MRI, clinically stable currently.   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  Neurology consulted and evaluated patient while hospitalized.  His left side is flaccid.  Mental status seems to be improving, he is more interactive, and has been talking more for the last couple of days   Acute kidney injury -baseline creatinine seems to be around 1.1-1.2   Anemia of chronic disease -hemoglobin stable, no bleeding   Nutrition--cont PEG feeding --6/15-- patient did not tolerate swallow study/evaluation by speech therapy   obesity Body mass index is 31.57 kg/m.  Fever (new 6/20) -cultures pending, urine pending and x ray negative for PNA -no further events off abx -continue to monitor  Will do cap trach trial after discussion with ENT and SLP   Family Communication/Anticipated D/C date and plan/Code Status   DVT prophylaxis: Lovenox ordered. Code Status: dnr Disposition Plan: Status is: Inpatient  Dispo:               Anticipated d/c is to:  tbd  Patient currently is medically stable to d/c.   Difficult to place patient Yes         Medical Consultants:   Ortho CIR SLP  Subjective:   Some right knee pain today  Objective:    Vitals:   02/08/21 0510 02/08/21 0753 02/08/21 0813 02/08/21 1158  BP: (!) 150/64 (!) 158/47 (!)  135/59 (!) 143/59  Pulse: 68 (!) 54 67 (!) 57  Resp: '18 14 18 19  ' Temp: 98.7 F (37.1 C) 98.7 F (37.1 C) 98.1 F (36.7 C) 98.1 F (36.7 C)  TempSrc: Oral Oral Oral   SpO2: 100% 100% 100% 100%  Weight:      Height:        Intake/Output Summary (Last 24 hours) at 02/08/2021 1249 Last data filed at 02/07/2021 2300 Gross per 24 hour  Intake 565.33 ml  Output --  Net 565.33 ml   Filed Weights   02/06/21 1600 02/06/21 1700 02/07/21 0418  Weight: 106.3 kg 106.3 kg 105.6 kg    Exam:   General: Appearance:    Obese male in bed     Lungs:     With trach, respirations unlabored  Heart:    Bradycardic.   MS:   All extremities are intact. Right knee warmer than left   Neurologic:   Awake, speech difficult to understand       Data Reviewed:   I have personally reviewed following labs and imaging studies:  Labs: Labs show the following:   Basic Metabolic Panel: Recent Labs  Lab 02/02/21 1245 02/06/21 0556 02/08/21 0442  NA 138 144 141  K 4.1 4.0 4.1  CL 108 113* 109  CO2 '24 25 23  ' GLUCOSE 184* 157* 179*  BUN 57* 61* 58*  CREATININE 1.37* 1.26* 1.26*  CALCIUM 8.9 8.9 8.7*   GFR Estimated Creatinine Clearance: 77.3 mL/min (A) (by C-G formula based on SCr of 1.26 mg/dL (H)). Liver Function Tests: Recent Labs  Lab 02/02/21 1245  AST 78*  ALT 199*  ALKPHOS 125  BILITOT 0.4  PROT 7.9  ALBUMIN 2.4*   No results for input(s): LIPASE, AMYLASE in the last 168 hours. No results for input(s): AMMONIA in the last 168 hours. Coagulation profile No results for input(s): INR, PROTIME in the last 168 hours.  CBC: Recent Labs  Lab 02/02/21 1245 02/05/21 0617 02/06/21 0556 02/08/21 0442  WBC 13.5* 12.2* 12.9* 16.0*  HGB 9.7* 10.2* 9.6* 10.7*  HCT 31.2* 32.1* 30.5* 34.2*  MCV 80.0 78.5* 79.6* 78.1*  PLT 326 316 328 349   Cardiac Enzymes: No results for input(s): CKTOTAL, CKMB, CKMBINDEX, TROPONINI in the last 168 hours. BNP (last 3 results) No results for  input(s): PROBNP in the last 8760 hours. CBG: Recent Labs  Lab 02/07/21 2206 02/08/21 0053 02/08/21 0504 02/08/21 0811 02/08/21 1159  GLUCAP 173* 147* 179* 197* 178*   D-Dimer: No results for input(s): DDIMER in the last 72 hours. Hgb A1c: No results for input(s): HGBA1C in the last 72 hours. Lipid Profile: No results for input(s): CHOL, HDL, LDLCALC, TRIG, CHOLHDL, LDLDIRECT in the last 72 hours. Thyroid function studies: No results for input(s): TSH, T4TOTAL, T3FREE, THYROIDAB in the last 72 hours.  Invalid input(s): FREET3 Anemia work up: No results for input(s): VITAMINB12, FOLATE, FERRITIN, TIBC, IRON, RETICCTPCT in the last 72 hours. Sepsis Labs: Recent Labs  Lab 02/02/21 1245 02/05/21 0617 02/06/21 0556 02/08/21 0442  WBC 13.5* 12.2* 12.9* 16.0*    Microbiology Recent Results (from the past  240 hour(s))  CULTURE, BLOOD (ROUTINE X 2) w Reflex to ID Panel     Status: None (Preliminary result)   Collection Time: 02/07/21  4:07 PM   Specimen: BLOOD  Result Value Ref Range Status   Specimen Description BLOOD BLOOD LEFT HAND  Final   Special Requests   Final    BOTTLES DRAWN AEROBIC AND ANAEROBIC Blood Culture adequate volume   Culture   Final    NO GROWTH < 24 HOURS Performed at [facility], [address], [place], [place] [address]    Report Status PENDING  Incomplete  CULTURE, BLOOD (ROUTINE X 2) w Reflex to ID Panel     Status: None (Preliminary result)   Collection Time: 02/07/21  5:11 PM   Specimen: BLOOD  Result Value Ref Range Status   Specimen Description BLOOD BLOOD LEFT HAND  Final   Special Requests   Final    BOTTLES DRAWN AEROBIC AND ANAEROBIC Blood Culture adequate volume   Culture   Final    NO GROWTH < 24 HOURS Performed at [facility], [address], [place], [place] [address]    Report Status PENDING  Incomplete    Procedures and diagnostic studies:  DG Chest Port 1 View  Result Date: 02/07/2021 CLINICAL  DATA:  Fever EXAM: PORTABLE CHEST 1 VIEW COMPARISON:  02/02/2021 FINDINGS: UPPER limits normal heart size and tracheostomy tube again noted. Peribronchial thickening is again identified. There is no evidence of focal airspace disease, pulmonary edema, suspicious pulmonary nodule/mass, pleural effusion, or pneumothorax. No acute bony abnormalities are identified. IMPRESSION: No active disease. Electronically Signed   By: Margarette Canada M.D.   On: 02/07/2021 16:48    Medications:    aspirin  81 mg Per Tube Daily   carvedilol  25 mg Per Tube BID WC   chlorhexidine gluconate (MEDLINE KIT)  15 mL Mouth Rinse BID   Chlorhexidine Gluconate Cloth  6 each Topical Q0600   docusate  100 mg Per Tube BID   enoxaparin (LOVENOX) injection  0.5 mg/kg Subcutaneous Q24H   famotidine  20 mg Per Tube Daily   free water  200 mL Per Tube Q6H   hydrALAZINE  50 mg Per Tube TID   insulin aspart  0-15 Units Subcutaneous TID AC & HS   mouth rinse  15 mL Mouth Rinse 10 times per day   polyethylene glycol  17 g Per Tube Daily   predniSONE  40 mg Per Tube Q breakfast   Continuous Infusions:  feeding supplement (NEPRO CARB STEADY) 1,000 mL (02/08/21 1238)     LOS: 45 days   Geradine Girt  [facility]   How to contact the [facility] Attending or Consulting provider [place] or covering provider during after hours [place], for this patient?  Check the care team in [facility] and look for a) attending/consulting TRH provider listed and b) the [facility] team listed Log into [email or web address] and use [place]'s universal password to access. If you do not have the password, please contact the hospital operator. Locate the [facility] provider you are looking for under [facility] and page to a number that you can be directly reached. If you still have difficulty reaching the provider, please page the [facility] (Director on Call) for the Hospitalists listed on amion for assistance.  02/08/2021, 12:49 PM

## 2021-02-08 NOTE — Progress Notes (Signed)
Speech Language Pathology Treatment: Dysphagia;Cognitive-Linquistic  Patient Details Name: Jeremy Browning MRN: [id number] DOB: 11-20-1960 Today's Date: 02/08/2021 Time: 4098-1191 SLP Time Calculation (min) (ACUTE ONLY): 26 min  Assessment / Plan / Recommendation Clinical Impression  Pt presents with behaviors characteristic of Rancho Level IV. Pt was able to maintain alertness throughout session (~20 minutes), follow simple directions, answer basic questions appropriately as well as interject appropriate comments. For example, when his nurse's ascom rang, he spontaneously replied, "tell 'em you'all call 'em back" and laughed appropriately. His oral response to ice chips, nectar thick liquids via spoon and straw as well as puree was much improved. Pt opened his mouth with each bolus presented and he made full sentence length comments after each bolus. When directed to hold the cup in his right hand, pt made attempt and visually attended to the cup. Pt able to support cup with hand over hand assistance. While pt's voice remained clear and his swallow appeared swift, he demonstrated possible signs of lingual pumping and multiple swallows per bolus. Since lingual pumping and silent aspiration cannot be assigned at bedside, recommend a Modified Barium Swallow Study out of an aboundance of caution prior to proceeding with an advanced diet. At this time, order placed for ice chips to faciliate use of swallow musculature with MBSS planned for 02/09/2021 at 0900.   If should also be noted that pt's attention to oral boluses faded after ~ 10 boluses. While liberating pt's diet is hopeful result of MBSS, his current cognitive deficits will limit his intake and thus he will continue to be reliant on external nutrition for the foreseeable future.   TRACH: Pt currently has a #6 cuffless Shiley. He has been tolearting PMSV during all waking hours. Secure chat sent to Dr Bud Face (ENT) and attending requesting  capping of pt's trach with hope for decannulation prior to discharge. While it appears he is medically ready, decannulation will also help with pt's discharge plan itself.    HPI HPI: Pt is a 60 yo male that presented from home with respiratory distress 12/25/2020. Underwent defibrillation and CPR, emergently intubated. During admission pt has had a cardiac cath, peg tube and tracheostomy placed, weaned from vent 01/13/2021. 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 Mildly restricted diffusion involving the cortex more diffusely over the right cerebral convexity favored to reflect an acute MCA territory infarct over global hypoxic injury given unilaterality".  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 No further acute events and pt continues to tolerate Trach collar @ 28% FiO2. #8 Shiley trach, cuffed.      SLP Plan  Continue with current plan of care       Recommendations  Diet recommendations:  (ice chips only) Medication Administration: Via alternative means Supervision: Staff to assist with self feeding;Full supervision/cueing for compensatory strategies Compensations: Minimize environmental distractions;Slow rate;Small sips/bites Postural Changes and/or Swallow Maneuvers: Seated upright 90 degrees;Upright 30-60 min after meal      Patient may use Passy-Muir Speech Valve: During all waking hours (remove during sleep);During PO intake/meals PMSV Supervision: Intermittent MD: Please consider changing trach tube to :  (capping trach or decanulation)         Oral Care Recommendations: Oral care prior to ice chip/H20;Oral care QID Follow up Recommendations: Inpatient Rehab SLP Visit Diagnosis: Cognitive communication deficit (R41.841);Dysphagia, oropharyngeal phase (R13.12)  Plan: Continue with current plan  of care       GO               Jeremy Browning B. Dreama Saa M.S., CCC-SLP, [facility] Speech-Language Pathologist Rehabilitation Services Office [phone]  Jeremy Browning Dreama Saa 02/08/2021, 11:20 AM

## 2021-02-08 NOTE — Progress Notes (Signed)
Physical Therapy Treatment Patient Details Name: Jeremy Browning MRN: [id number] DOB: [birth year] Today's Date: 02/08/2021    History of Present Illness Pt is a 60 yo male that presented from home with respiratory distress 12/25/2020. Underwent defibrillation and CPR, emergently intubated. During admission pt has had a cardiac cath, peg tube and tracheostomy placed, weaned from vent 5/26 AM. 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 Mildly restricted diffusion involving the cortex more diffusely over the right cerebral convexity favored to reflect an acute MCA territory infarct over global hypoxic injury given unilaterality".    PT Comments    Patient alert throughout session, intermittently agitated, progressed to RLAS IV. Stated several times "you're pushing me" but easily redirected to task. Pt demonstrated great progression towards mobility today; able to perform supine to sit with maxA and extended time to maximize pt participation. Posterior lean noted due to pt being fearful at EOB, less pushing behavior noted. maxAx2 to scoot to recliner, pt able to reach with RUE towards recliner arm to assist, and maintain sitting balance with CGA/minA. Pt also able to move LLE much better on command today. Current recommendation remains appropriate to maximize pt function, independence, and mobility.      Follow Up Recommendations  CIR     Equipment Recommendations  None recommended by PT    Recommendations for Other Services Speech consult     Precautions / Restrictions Precautions Precautions: Fall Precaution Comments: trach, PEG Restrictions Weight Bearing Restrictions: No    Mobility  Bed Mobility Overal bed mobility: Needs Assistance Bed Mobility: Supine to Sit     Supine to sit: Max assist     General bed mobility comments: extended time due to pt agitation, but able to move RLE off/on bed, assist for LLE, able to reach for bed  rails with RUE when prompted    Transfers Overall transfer level: Needs assistance   Transfers: Lateral/Scoot Transfers          Lateral/Scoot Transfers: Max assist;+2 physical assistance;With slide board    Ambulation/Gait                 Stairs             Wheelchair Mobility    Modified Rankin (Stroke Patients Only)       Balance Overall balance assessment: Needs assistance Sitting-balance support: Feet supported;Bilateral upper extremity supported Sitting balance-Leahy Scale: Poor Sitting balance - Comments: pt with posterior lean throughout session, able to adjust to midline position at EOB once Postural control: Posterior lean                                  Cognition Arousal/Alertness: Awake/alert Behavior During Therapy: Agitated;Flat affect Overall Cognitive Status: Impaired/Different from baseline Area of Impairment: Rancho level;Orientation               Rancho Levels of Cognitive Functioning Ranch[place] Mirant Scales of Cognitive Functioning: Confused/agitated                      Exercises Other Exercises Other Exercises: able to perform LAQ AROM- AAROM, (AAROM for full ROM on RLE) x10. AAROM on LLE, multimodal cues to attend to task    General Comments        Pertinent Vitals/Pain Pain Assessment: No/denies pain    Home Living  Prior Function            PT Goals (current goals can now be found in the care plan section) Progress towards PT goals: Progressing toward goals    Frequency    Min 2X/week      PT Plan Current plan remains appropriate    Co-evaluation              AM-PAC PT "6 Clicks" Mobility   Outcome Measure  Help needed turning from your back to your side while in a flat bed without using bedrails?: Total Help needed moving from lying on your back to sitting on the side of a flat bed without using bedrails?: Total Help needed moving to  and from a bed to a chair (including a wheelchair)?: Total Help needed standing up from a chair using your arms (e.g., wheelchair or bedside chair)?: Total Help needed to walk in hospital room?: Total Help needed climbing 3-5 steps with a railing? : Total 6 Click Score: 6    End of Session Equipment Utilized During Treatment: Oxygen (5L) Activity Tolerance: Patient tolerated treatment well Patient left: in chair;with chair alarm set;with call bell/phone within reach Nurse Communication: Mobility status PT Visit Diagnosis: Other abnormalities of gait and mobility (R26.89);Muscle weakness (generalized) (M62.81);Other symptoms and signs involving the nervous system (R29.898)     Time: 7741-2878 PT Time Calculation (min) (ACUTE ONLY): 28 min  Charges:  $Neuromuscular Re-education: 23-37 mins                     Olga Coaster PT, DPT 3:44 PM,02/08/21

## 2021-02-08 NOTE — Progress Notes (Signed)
Pt's trach capped per MD, tolerating well at this time. Placed on 2lpm [place], sats 100%, respiratory rate 18/min. RN aware.

## 2021-02-08 NOTE — TOC Progression Note (Signed)
Transition of Care [facility]) - Progression Note    Patient Details  Name: Jeremy Browning MRN: [id number] Date of Birth: Feb 02, 1961  Transition of Care [facility]) CM/SW Contact  Hetty Ely, RN Phone Number: 02/08/2021, 12:42 PM  Clinical Narrative:   Lillia Abed at [facility], [phone] to get an update on SNF application, no answer left message to return call.    Expected Discharge Plan: Skilled Nursing Facility Barriers to Discharge: Continued Medical Work up  Expected Discharge Plan and Services Expected Discharge Plan: Skilled Nursing Facility In-house Referral: Clinical Social Work   Post Acute Care Choice: Skilled Nursing Facility                                         Social Determinants of Health (SDOH) Interventions    Readmission Risk Interventions No flowsheet data found.

## 2021-02-08 NOTE — Progress Notes (Signed)
OT Cancellation Note  Patient Details Name: Rodricus Candelaria MRN: [id number] DOB: 1961/08/06   Cancelled Treatment:    Reason Eval/Treat Not Completed: Patient at procedure or test/ unavailable (Pt. is out of his room at an MBSS. WIll continue to monitor, and see pt. at a later time, or date.)  Olegario Messier, MS, OTR/L 02/08/2021, 2:30 PM

## 2021-02-08 NOTE — Progress Notes (Signed)
Nutrition Follow Up Note   DOCUMENTATION CODES:   Obesity unspecified  INTERVENTION:   Increase Nepro 1.8  to 5m/hr   Discontinue Pro-Source 454mBID via tube  Continue Free water flushes to 2009m4 hours   Regimen provides 3024kcal/day, 136g/day protein and 2421m40my free water   NUTRITION DIAGNOSIS:   Inadequate oral intake related to inability to eat (pt sedated and ventilated) as evidenced by NPO status.  GOAL:   Patient will meet greater than or equal to 90% of their needs  -met with tube feeds   MONITOR:   Labs, Weight trends, TF tolerance, Skin, I & O's  ASSESSMENT:   59 y9. black male with diabetes mellitus type II, hypertension and congestive heart failure who [facility] for cardiogenic shock and flash pulmonary edema  Pt s/p PEG tube 5/24  Pt s/p tracheostomy 5/25  Met with pt in room today. Pt reports that is is feeling well today. Pt using Passy-Muir valve to speak. SLP following and is planning for MBSS tomorrow as pt is asking to eat. Pt tolerating tube feeds well at goal rate. Per chart, pt is down ~32lbs from his UBW. Pt does have some mild depletions on exam today that were not present on admission. RD will adjust pt's estimated needs and increase his tube feed regimen in the setting of weight loss. Plan is for SNF at discharge.   Medications reviewed and include: aspirin, colace, lovenox, pepcid, insulin, miralax, prednisone   Labs reviewed: Na 141 wnl, K 4.1 wnl, BUN 58(H), creat 1.26(H) Wbc- 16.0(H), Hgb 10.7(L), Hct 34.2(L), MCV 78.1(L), MCH 24.4(L) cbgs- 147, 179, 197, 178 x 24 hrs  Nutrition Focused Physical Exam   Flowsheet Row Most Recent Value  Orbital Region No depletion  Upper Arm Region Mild depletion  Thoracic and Lumbar Region No depletion  Buccal Region No depletion  Temple Region Mild depletion  Clavicle Bone Region Mild depletion  Clavicle and Acromion Bone Region Mild depletion  Scapular Bone Region No  depletion  Dorsal Hand No depletion  Patellar Region No depletion  Anterior Thigh Region No depletion  Posterior Calf Region No depletion  Edema (RD Assessment) Mild  Hair Reviewed  Eyes Reviewed  Mouth Reviewed  Skin Reviewed  Nails Reviewed   Diet Order:   Diet Order             Diet NPO time specified  Diet effective now                  EDUCATION NEEDS:   No education needs have been identified at this time  Skin:  Skin Assessment: Reviewed RN Assessment: incision neck s/p trach   Last BM:  6/21- type 7  Height:   Ht Readings from Last 1 Encounters:  02/05/21 6' (1.829 m)    Weight:   Wt Readings from Last 1 Encounters:  02/07/21 105.6 kg    BMI:  Body mass index is 31.57 kg/m.  Estimated Nutritional Needs:   Kcal:  2700-3000kcal/day  Protein:  125-140g/day  Fluid:  2.4-2.7L/day  CaseKoleen Distance RD, LDN Please refer to [facility] and/or RD on-call/weekend/after hours pager

## 2021-02-09 LAB — CBC WITH DIFFERENTIAL/PLATELET
Abs Immature Granulocytes: 0.13 10*3/uL — ABNORMAL HIGH (ref 0.00–0.07)
Basophils Absolute: 0 10*3/uL (ref 0.0–0.1)
Basophils Relative: 0 %
Eosinophils Absolute: 0.3 10*3/uL (ref 0.0–0.5)
Eosinophils Relative: 2 %
HCT: 33.1 % — ABNORMAL LOW (ref 39.0–52.0)
Hemoglobin: 10.4 g/dL — ABNORMAL LOW (ref 13.0–17.0)
Immature Granulocytes: 1 %
Lymphocytes Relative: 32 %
Lymphs Abs: 5.3 10*3/uL — ABNORMAL HIGH (ref 0.7–4.0)
MCH: 24.6 pg — ABNORMAL LOW (ref 26.0–34.0)
MCHC: 31.4 g/dL (ref 30.0–36.0)
MCV: 78.3 fL — ABNORMAL LOW (ref 80.0–100.0)
Monocytes Absolute: 1.6 10*3/uL — ABNORMAL HIGH (ref 0.1–1.0)
Monocytes Relative: 10 %
Neutro Abs: 9.3 10*3/uL — ABNORMAL HIGH (ref 1.7–7.7)
Neutrophils Relative %: 55 %
Platelets: 255 10*3/uL (ref 150–400)
RBC: 4.23 MIL/uL (ref 4.22–5.81)
RDW: 15.4 % (ref 11.5–15.5)
Smear Review: UNDETERMINED
WBC: 16.7 10*3/uL — ABNORMAL HIGH (ref 4.0–10.5)
nRBC: 0 % (ref 0.0–0.2)

## 2021-02-09 LAB — GLUCOSE, CAPILLARY
Glucose-Capillary: 222 mg/dL — ABNORMAL HIGH (ref 70–99)
Glucose-Capillary: 173 mg/dL — ABNORMAL HIGH (ref 70–99)
Glucose-Capillary: 196 mg/dL — ABNORMAL HIGH (ref 70–99)
Glucose-Capillary: 171 mg/dL — ABNORMAL HIGH (ref 70–99)
Glucose-Capillary: 189 mg/dL — ABNORMAL HIGH (ref 70–99)
Glucose-Capillary: 250 mg/dL — ABNORMAL HIGH (ref 70–99)
Glucose-Capillary: 198 mg/dL — ABNORMAL HIGH (ref 70–99)

## 2021-02-09 NOTE — Progress Notes (Signed)
Inpatient Rehab Admissions Coordinator:   Consult received. I spoke to pt's family over the phone (brother and his wife).  Pt was living with them PTA and essentially independent.  We discussed recommendations for CIR and what that entails.  I explained 3 hours of intensive therapy per day, across 3 disciplines, with physician oversight.  I also let them know that I had discussed case with our rehab physician who feels that given slow (although steady) progress, would likely continue to require significant physical and cognitive assistance even following CIR admission.  Pt's family expressed that their ultimate goal was long term care for this patient, as they no longer feel they can provide the level of care anticipated.  I let them know that pt's insurance ([employer] [facility]) requires prior authorization, and that they will not approve SNF following CIR admission, which would make ultimate LTC placement difficult.  At this time, they prefer SNF option for rehab, as it will allow pt to transition to LTC.  CIR will sign off at this time.   Estill Dooms, PT, DPT Admissions Coordinator [phone] 02/09/21  2:27 PM

## 2021-02-09 NOTE — Progress Notes (Signed)
[facility] Progress Note  Patient: Jeremy Browning    ZOX:[id number]  DOA: 12/25/2020     Date of Service: the patient was seen and examined on 02/09/2021  Chief Complaint  Patient presents with   Cardiac Arrest   Brief hospital course: Medical records reviewed and are as summarized below:   Jeremy Browning is an 60 y.o. male with obesity, DM2, HTN, combined CHF who was admitted on 12/25/2020 with respiratory distress.  He was intubated in the ER also had VT requiring epi/cardioversion.  Hospital course complicated by persistent vent dependent respiratory failure eventually requiring tracheostomy, anoxic encephalopathy.Placement issue.  Most recent issue was pain in right knee.  Currently further plan is continue current treatment until placement.  Assessment and Plan: Active Problems:   Cardiogenic shock (HCC)   Cardiac arrest with successful resuscitation ([place])   Flash pulmonary edema ([place])   Cardiopulmonary arrest with successful resuscitation ([place])   Acute respiratory failure with hypoxia and hypercapnia (HCC)   Acute renal failure (HCC)   Transaminitis   Second degree heart block   Lactic acidosis   Aspiration pneumonia (HCC)   HFrEF (heart failure with reduced ejection fraction) (HCC)   Severe hypoxic-ischemic encephalopathy   SOB (shortness of breath)   Oropharyngeal dysphagia     Acute hypoxic respiratory failure in the setting of cardiac arrest, pulmonary edema, Proteus pneumonia  -treated while in the ICU, completed a course of antibiotics with ceftriaxone and Unasyn.  Has not required them for the past week, tolerating trach collar -Continue aggressive pulmonary toilet, continue bronchodilators -Respiratory status is stable    Cardiac arrest / V. Tach / non-STEMI / acute combined systolic and diastolic CHF /cardiogenic shock --requiring pressors while in the ICU, currently resolved. -- Cardiology consulted and underwent a cardiac cath on 5/7 which showed normal  coronaries. -- A 2D echo done on 5/9 showed an EF of 45-50% with global hypokinesis of the left ventricle.  It also showed grade 1 diastolic dysfunction.  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 Recommends  steroids for now.-- start to wean down as the IV steroids appeared to cause some psychosis/behavioral issues   Elevated LFTs -noticed on 6/11, overall trending down.   --Hepatitis panel negative, right upper quadrant ultrasound negative.  --Discontinued Tylenol and statin   Anoxic brain encephalopathy, CVAs-high suspicion based on the MRI, clinically stable currently.   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  Neurology consulted and evaluated patient while hospitalized.  His left side is flaccid.  Mental status seems to be improving, he is more interactive, and has been talking more for the last couple of days   Acute kidney injury -baseline creatinine seems to be around 1.1-1.2   Anemia of chronic disease -hemoglobin stable, no bleeding   Nutrition--cont PEG feeding --6/15-- patient did not tolerate swallow study/evaluation by speech therapy   obesity Body mass index is 31.57 kg/m.   Fever (new 6/20) x ray negative for PNA -no further events off abx -continue to monitor 6/22 afebrile, WBC 16.7 still elevated Blood culture no growth till date,    Body mass index is 32.89 kg/m.  Nutrition Problem: Inadequate oral intake Etiology: inability to eat (pt sedated and ventilated) Interventions:    Pressure Injury Buttocks Left (Active)  Location: Buttocks  Location Orientation: Left  Staging:   Wound Description (Comments):   Present on Admission:      Pressure Injury 02/09/21 Buttocks Medial Stage 2 -  Partial  thickness loss of dermis presenting as a shallow open injury with a red, pink wound bed without slough. (Active)  02/09/21 1209  Location: Buttocks  Location Orientation: Medial  Staging: Stage 2 -  Partial thickness loss of dermis presenting as a shallow open injury with a red, pink wound bed without slough.  Wound Description (Comments):   Present on Admission:      Pressure Injury 02/09/21 Buttocks Left Stage 2 -  Partial thickness loss of dermis presenting as a shallow open injury with a red, pink wound bed without slough. (Active)  02/09/21 1213  Location: Buttocks  Location Orientation: Left  Staging: Stage 2 -  Partial thickness loss of dermis presenting as a shallow open injury with a red, pink wound bed without slough.  Wound Description (Comments):   Present on Admission:      Diet: peg tube feeding DVT Prophylaxis: Subcutaneous Lovenox   Advance goals of care discussion: DNR  Family Communication: family was not present at bedside, at the time of interview.  The pt provided permission to discuss medical plan with the family. Opportunity was given to ask question and all questions were answered satisfactorily.   Disposition:  Pt is from home, admitted with cardiogenic shock, anoxic brain injury, CVA, s/p trach and PEG tube, which precludes a safe discharge. Discharge to [facility] versus SNF, when bed available.  Difficult to place  Subjective: No active overnight issues, patient not feeling cold and requesting warm blankets, RN was informed, patient denied any other active issues.  Physical Exam: General:  alert, NAD, lying in bed comfortably.  Appear in no  distress, affect appropriate Eyes: PERRLA ENT: Oral Mucosa Clear, moist  Neck: no JVD, s/p tracheostomy Cardiovascular: S1 and S2 Present, no Murmur,  Respiratory: good respiratory effort, Bilateral Air entry equal and Decreased, no Crackles, no wheezes Abdomen: Bowel Sound present, Soft, obese and no tenderness, s/p  PEG tube Skin: no rashes Extremities: mild Pedal edema, no calf tenderness Neurologic: awake and alert Gait not checked due to patient safety concerns  Vitals:   02/09/21 0528 02/09/21 0727 02/09/21 0842 02/09/21 1132  BP:  (!) 149/63  (!) 132/57  Pulse:  73  (!) 57  Resp:  18  18  Temp:  98.6 F (37 C)  98.4 F (36.9 C)  TempSrc:  Oral  Oral  SpO2: 100% 100% 99% 99%  Weight: 110 kg     Height:        Intake/Output Summary (Last 24 hours) at 02/09/2021 1413 Last data filed at 02/08/2021 2300 Gross per 24 hour  Intake 120 ml  Output --  Net 120 ml   Filed Weights   02/06/21 1700 02/07/21 0418 02/09/21 0528  Weight: 106.3 kg 105.6 kg 110 kg    Data Reviewed: I have personally reviewed and interpreted daily labs, tele strips, imagings as discussed above. I reviewed all nursing notes, pharmacy notes, vitals, pertinent old records I have discussed plan of care as described above with RN and patient/family.  CBC: Recent Labs  Lab 02/05/21 0617 02/06/21 0556 02/08/21 0442 02/09/21 0430  WBC 12.2* 12.9* 16.0* 16.7*  NEUTROABS  --   --   --  9.3*  HGB 10.2* 9.6* 10.7* 10.4*  HCT 32.1* 30.5* 34.2* 33.1*  MCV 78.5* 79.6* 78.1* 78.3*  PLT 316 328 349 494   Basic Metabolic Panel: Recent Labs  Lab 02/06/21 0556 02/08/21 0442  NA 144 141  K 4.0 4.1  CL 113* 109  CO2 25 23  GLUCOSE 157* 179*  BUN 61* 58*  CREATININE 1.26* 1.26*  CALCIUM 8.9 8.7*    Studies: DG Swallowing Func-Speech Pathology  Result Date: 02/08/2021 Formatting of this result is different from the original. Objective Swallowing Evaluation: Type of Study: MBS-Modified Barium Swallow Study  Patient Details Name: Custer Pimenta MRN: [id number] Date of Birth: 06/29/1961 Today's Date: 02/08/2021 Time: SLP Start Time (ACUTE ONLY): 1410 -SLP Stop Time (ACUTE ONLY): 1440 SLP Time Calculation (min) (ACUTE ONLY): 30 min Past Medical History: Past Medical History: Diagnosis Date  Diabetes mellitus type II, non  insulin dependent ([place])   Essential hypertension [name]  Heart failure ([place])   Unknown details.  By report no cardiac surgery  Venous stasis dermatitis of left lower extremity  Past Surgical History: Past Surgical History: Procedure Laterality Date  CARDIAC CATHETERIZATION    LEFT HEART CATH AND CORONARY ANGIOGRAPHY N/A 12/25/2020  Procedure: LEFT HEART CATH AND CORONARY ANGIOGRAPHY;  Surgeon: Leonie Man, MD;  Location: [facility];  Service: Cardiovascular;  Laterality: N/A;  PEG PLACEMENT N/A 01/11/2021  Procedure: PERCUTANEOUS ENDOSCOPIC GASTROSTOMY (PEG) PLACEMENT;  Surgeon: Lucilla Lame, MD;  Location: [facility];  Service: Endoscopy;  Laterality: N/A;  RIGHT HEART CATH N/A 12/25/2020  Procedure: RIGHT HEART CATH;  Surgeon: Leonie Man, MD;  Location: [facility];  Service: Cardiovascular;  Laterality: N/A;  TRACHEOSTOMY TUBE PLACEMENT N/A 01/12/2021  Procedure: TRACHEOSTOMY;  Surgeon: Carloyn Manner, MD;  Location: [facility];  Service: ENT;  Laterality: N/A;  Unknown   HPI: Pt is a 60 yo male that presented from home with respiratory distress 12/25/2020. Underwent defibrillation and CPR, emergently intubated. During admission pt has had a cardiac cath, peg tube and tracheostomy placed, weaned from vent 01/13/2021. 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 Mildly restricted diffusion involving the cortex more diffusely over the right cerebral convexity favored to reflect an acute MCA territory infarct over global hypoxic injury given unilaterality".  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 Pt currently has a #6 cuffless Shiley with PMSV worn during all waking hours.  Subjective: Rancho Level IV - very talkative Assessment / Plan / Recommendation CHL IP CLINICAL IMPRESSIONS 02/08/2021 Clinical  Impression Pt presents with moderate oral phase and mild pharyngeal phase dysphagia. Pt's oral phase deficits appear to be multifactorial related to current cognitive status as well as left oral motor weakness. He is currently a Rancho Level IV and has difficulty following specific oral motor directions. Additionally, he has a #6 cuffless Shiley with PMSV in place. Functionally he has left facial weakness and likely decreased lingual strength/ROM. Orally, when consuming thin liquids (via spoon, cup, straw) and nectar thick liquids (via spoon and cup), pt has gross left anterior spillage, reduced bolus cohesion resulting in premature spillage and lingual palatal residue.  He had occasional piecemeal swallowing with thin liquids via cup. Pt's oral phase was appropriate when consuming puree and mildly prolonged with dysphagia 2 textures. He has multiple swallows to clear oral cavity of dysphagia 2 items. Pt's pharyngeal phase appears sensory related as his swallow is initiated at the pyriform sinuses with penetration of thin  liquids when consuming multiple swallows via straw. Penetrates were expelled during swallow with exception x 1 when residue remained in laryngeal vestibule (PAS 3). Of note, pt has been NPO for 46 days and his oropharyngeal abilities improved as study progressed and ongoing use of swallow musculature. At this time, recommend a conservative diet of dysphagia 1 with thin liquids via cup with trials of dysphagia 2 at bedside with SLP. While pt is able to consume medicine crushed in puree, recommend continued administration via alternative means at this time. Pt requires total assistance with self-feeding and should have FULL NURSING SUPERVISION during all PO intake. SLP Visit Diagnosis Dysphagia, oropharyngeal phase (R13.12) Attention and concentration deficit following -- Frontal lobe and executive function deficit following -- Impact on safety and function Mild aspiration risk   CHL IP TREATMENT  RECOMMENDATION 02/08/2021 Treatment Recommendations Therapy as outlined in treatment plan below   Prognosis 02/08/2021 Prognosis for Safe Diet Advancement Good Barriers to Reach Goals Cognitive deficits Barriers/Prognosis Comment -- CHL IP DIET RECOMMENDATION 02/08/2021 SLP Diet Recommendations Dysphagia 1 (Puree) solids;Thin liquid Liquid Administration via Cup Medication Administration Via alternative means Compensations Minimize environmental distractions;Slow rate;Small sips/bites Postural Changes Seated upright at 90 degrees   CHL IP OTHER RECOMMENDATIONS 02/08/2021 Recommended Consults -- Oral Care Recommendations Oral care BID Other Recommendations Have oral suction available   CHL IP FOLLOW UP RECOMMENDATIONS 02/08/2021 Follow up Recommendations Inpatient Rehab   CHL IP FREQUENCY AND DURATION 02/02/2021 Speech Therapy Frequency (ACUTE ONLY) min 2x/week Treatment Duration 4 weeks      CHL IP ORAL PHASE 02/08/2021 Oral Phase Impaired Oral - Pudding Teaspoon -- Oral - Pudding Cup -- Oral - Honey Teaspoon -- Oral - Honey Cup -- Oral - Nectar Teaspoon Left anterior bolus loss;Lingual/palatal residue;Premature spillage Oral - Nectar Cup Left anterior bolus loss;Premature spillage;Lingual/palatal residue Oral - Nectar Straw -- Oral - Thin Teaspoon Left anterior bolus loss;Lingual/palatal residue;Premature spillage Oral - Thin Cup Left anterior bolus loss;Weak lingual manipulation;Lingual/palatal residue;Piecemeal swallowing;Decreased bolus cohesion;Premature spillage Oral - Thin Straw Left anterior bolus loss;Lingual/palatal residue;Premature spillage;Decreased bolus cohesion Oral - Puree Lingual/palatal residue;Left anterior bolus loss Oral - Mech Soft Weak lingual manipulation;Left anterior bolus loss;Lingual/palatal residue;Impaired mastication;Reduced posterior propulsion Oral - Regular -- Oral - Multi-Consistency -- Oral - Pill -- Oral Phase - Comment --  CHL IP PHARYNGEAL PHASE 02/08/2021 Pharyngeal Phase Impaired  Pharyngeal- Pudding Teaspoon -- Pharyngeal -- Pharyngeal- Pudding Cup -- Pharyngeal -- Pharyngeal- Honey Teaspoon -- Pharyngeal -- Pharyngeal- Honey Cup -- Pharyngeal -- Pharyngeal- Nectar Teaspoon Delayed swallow initiation-pyriform sinuses Pharyngeal -- Pharyngeal- Nectar Cup Delayed swallow initiation-vallecula Pharyngeal -- Pharyngeal- Nectar Straw -- Pharyngeal -- Pharyngeal- Thin Teaspoon Delayed swallow initiation-pyriform sinuses Pharyngeal -- Pharyngeal- Thin Cup Delayed swallow initiation-pyriform sinuses Pharyngeal -- Pharyngeal- Thin Straw Delayed swallow initiation-pyriform sinuses;Penetration/Aspiration during swallow Pharyngeal Material enters airway, remains ABOVE vocal cords then ejected out;Material enters airway, remains ABOVE vocal cords and not ejected out Pharyngeal- Puree WFL Pharyngeal -- Pharyngeal- Mechanical Soft WFL Pharyngeal -- Pharyngeal- Regular -- Pharyngeal -- Pharyngeal- Multi-consistency -- Pharyngeal -- Pharyngeal- Pill -- Pharyngeal -- Pharyngeal Comment --  CHL IP CERVICAL ESOPHAGEAL PHASE 02/08/2021 Cervical Esophageal Phase WFL Pudding Teaspoon -- Pudding Cup -- Honey Teaspoon -- Honey Cup -- Nectar Teaspoon -- Nectar Cup -- Nectar Straw -- Thin Teaspoon -- Thin Cup -- Thin Straw -- Puree -- Mechanical Soft -- Regular -- Multi-consistency -- Pill -- Cervical Esophageal Comment -- Happi [place] 02/08/2021, 9:09 PM  Scheduled Meds:  aspirin  81 mg Per Tube Daily   carvedilol  25 mg Per Tube BID WC   chlorhexidine gluconate (MEDLINE KIT)  15 mL Mouth Rinse BID   Chlorhexidine Gluconate Cloth  6 each Topical Q0600   diclofenac Sodium  2 g Topical QID   docusate  100 mg Per Tube BID   enoxaparin (LOVENOX) injection  0.5 mg/kg Subcutaneous Q24H   famotidine  20 mg Per Tube Daily   free water  200 mL Per Tube Q6H   hydrALAZINE  50 mg Per Tube TID   insulin aspart  0-15 Units Subcutaneous TID AC & HS   mouth rinse  15 mL Mouth Rinse 10 times per day    polyethylene glycol  17 g Per Tube Daily   predniSONE  40 mg Per Tube Q breakfast   Continuous Infusions:  feeding supplement (NEPRO CARB STEADY) 1,000 mL (02/08/21 2255)   PRN Meds: acetaminophen (TYLENOL) oral liquid 160 mg/5 mL, albuterol, docusate, hydrALAZINE, lip balm, oxyCODONE, polyethylene glycol, polyvinyl alcohol, sodium chloride flush, sodium chloride flush  Time spent: 35 minutes  Author: Val Riles. MD [facility] 02/09/2021 2:13 PM  To reach On-call, see care teams to locate the attending and reach out to them via [email or web address]. If 7PM-7AM, please contact night-coverage If you still have difficulty reaching the attending provider, please page the [facility] (Director on Call) for [facility] on amion for assistance.

## 2021-02-09 NOTE — NC FL2 (Signed)
[place] LEVEL OF CARE SCREENING TOOL     IDENTIFICATION  Patient Name: Jeremy Browning Birthdate: [birth year] Sex: male Admission Date (Current Location): 12/25/2020  [facility] [place] Number:  Selena Lesser [id number] Facility and Address:  [facility], [address] [place], [place] [address]      Provider Number: [id number]  Attending Physician Name and Address:  Val Riles, MD  Relative Name and Phone Number:  Tyland Klemens, sister [phone], Winston Misner, brother [phone]    Current Level of Care: Hospital Recommended Level of Care: [place] Prior Approval Number:    Date Approved/Denied:   PASRR Number: Pending  Discharge Plan: SNF    Current Diagnoses: Patient Active Problem List   Diagnosis Date Noted   Oropharyngeal dysphagia    SOB (shortness of breath)    Severe hypoxic-ischemic encephalopathy    HFrEF (heart failure with reduced ejection fraction) (HCC)    Cardiogenic shock ([place]) 12/25/2020   Cardiopulmonary arrest with successful resuscitation ([place]) 12/25/2020   Acute renal failure ([place]) 12/25/2020   Transaminitis 12/25/2020   Second degree heart block 12/25/2020   Lactic acidosis 12/25/2020   Aspiration pneumonia ([place]) 12/25/2020   Cardiac arrest with successful resuscitation ([place])    Flash pulmonary edema ([place])    Acute respiratory failure with hypoxia and hypercapnia ([place])     Orientation RESPIRATION BLADDER Height & Weight     Self, Place  Tracheostomy Indwelling catheter Weight: 110 kg Height:  6' (182.9 cm)  BEHAVIORAL SYMPTOMS/MOOD NEUROLOGICAL BOWEL NUTRITION STATUS      Incontinent Feeding tube, Diet (Peg Tube, now starting Dysphagia 1 diet.)  AMBULATORY STATUS COMMUNICATION OF NEEDS Skin   Extensive Assist Verbally Other (Comment) (Left buttock pressure area apply foam every shif.)                       Personal Care Assistance Level of Assistance  Bathing, Feeding,  Dressing Bathing Assistance: Maximum assistance Feeding assistance: Maximum assistance Dressing Assistance: Limited assistance Total Care Assistance: Limited assistance   Functional Limitations Info  Sight, Hearing, Speech Sight Info: Adequate Hearing Info: Adequate Speech Info: Adequate    SPECIAL CARE FACTORS FREQUENCY  PT (By licensed PT), OT (By licensed OT)     PT Frequency: 5x week OT Frequency: 5x week            Contractures Contractures Info: Not present    Additional Factors Info  Code Status, Allergies Code Status Info: DNR Allergies Info: None           Current Medications (02/09/2021):  This is the current hospital active medication list Current Facility-Administered Medications  Medication Dose Route Frequency Provider Last Rate Last Admin   acetaminophen (TYLENOL) 160 MG/5ML solution 650 mg  650 mg Per Tube Q6H PRN Fritzi Mandes, MD   650 mg at 02/07/21 1303   albuterol (PROVENTIL) (2.5 MG/3ML) 0.083% nebulizer solution 2.5 mg  2.5 mg Nebulization Q4H PRN Rust-Chester, Britton L, NP   2.5 mg at 01/14/21 1935   aspirin chewable tablet 81 mg  81 mg Per Tube Daily Flora Lipps, MD   81 mg at 02/09/21 0824   carvedilol (COREG) tablet 25 mg  25 mg Per Tube BID WC Fritzi Mandes, MD   25 mg at 02/09/21 0835   chlorhexidine gluconate (MEDLINE KIT) (PERIDEX) 0.12 % solution 15 mL  15 mL Mouth Rinse BID Rust-Chester, Britton L, NP   15 mL at 02/09/21 0835   Chlorhexidine Gluconate  Cloth 2 % PADS 6 each  6 each Topical Q0600 Leonie Man, MD   6 each at 02/09/21 0300   diclofenac Sodium (VOLTAREN) 1 % topical gel 2 g  2 g Topical QID Vann, Jessica U, DO   2 g at 02/09/21 1414   docusate (COLACE) 50 MG/5ML liquid 100 mg  100 mg Per Tube BID Mauri Brooklyn, MD   100 mg at 02/09/21 0836   docusate (COLACE) 50 MG/5ML liquid 100 mg  100 mg Per Tube BID PRN Eulogio [place] U, DO       enoxaparin (LOVENOX) injection 52.5 mg  0.5 mg/kg Subcutaneous Q24H Oswald Hillock, RPH   52.5  mg at 02/09/21 1207   famotidine (PEPCID) 40 MG/5ML suspension 20 mg  20 mg Per Tube Daily Mauri Brooklyn, MD   20 mg at 02/09/21 0824   feeding supplement (NEPRO CARB STEADY) liquid 1,000 mL  1,000 mL Per Tube Continuous Eulogio [place] U, DO 70 mL/hr at 02/08/21 2255 1,000 mL at 02/08/21 2255   free water 200 mL  200 mL Per Tube Q6H Caren Griffins, MD   200 mL at 02/09/21 1136   hydrALAZINE (APRESOLINE) injection 10 mg  10 mg Intravenous Q4H PRN Minna Merritts, MD   10 mg at 01/21/21 8882   hydrALAZINE (APRESOLINE) tablet 50 mg  50 mg Per Tube TID Fritzi Mandes, MD   50 mg at 02/09/21 1414   insulin aspart (novoLOG) injection 0-15 Units  0-15 Units Subcutaneous TID AC & HS Fritzi Mandes, MD   3 Units at 02/09/21 1207   lip balm (BLISTEX) ointment   Topical PRN Eulogio [place] U, DO       MEDLINE mouth rinse  15 mL Mouth Rinse 10 times per day Rust-Chester, Britton L, NP   15 mL at 02/09/21 1420   oxyCODONE (Oxy IR/ROXICODONE) immediate release tablet 5 mg  5 mg Per Tube Q6H PRN Flora Lipps, MD   5 mg at 02/08/21 1654   polyethylene glycol (MIRALAX / GLYCOLAX) packet 17 g  17 g Per Tube Daily Mauri Brooklyn, MD   17 g at 02/09/21 0825   polyethylene glycol (MIRALAX / GLYCOLAX) packet 17 g  17 g Per Tube Daily PRN Flora Lipps, MD       polyvinyl alcohol (LIQUIFILM TEARS) 1.4 % ophthalmic solution 2 drop  2 drop Both Eyes PRN Lorella Nimrod, MD   2 drop at 01/25/21 2210   predniSONE (DELTASONE) tablet 40 mg  40 mg Per Tube Q breakfast Vann, Jessica U, DO   40 mg at 02/09/21 0825   sodium chloride flush (NS) 0.9 % injection 10-40 mL  10-40 mL Intracatheter PRN Leonie Man, MD   10 mL at 01/02/21 1008   sodium chloride flush (NS) 0.9 % injection 10-40 mL  10-40 mL Intracatheter PRN Tyler Pita, MD   10 mL at 01/28/21 8003     Discharge Medications: Please see discharge summary for a list of discharge medications.  Relevant Imaging Results:  Relevant Lab Results:   Additional  Information SS# [id number]  Kerin Salen, RN

## 2021-02-09 NOTE — TOC Progression Note (Signed)
Transition of Care [facility]) - Progression Note    Patient Details  Name: Jeremy Browning MRN: [id number] Date of Birth: May 24, 1961  Transition of [facility]) CM/SW Contact  Hetty Ely, RN Phone Number: 02/09/2021, 3:44 PM  Clinical Narrative:  Family request that patient go to SNF for physical therapy and not CIR, per PT. New FL2 done, 30-day note faxed to [place] [facility] for PASRR number.    Expected Discharge Plan: Skilled Nursing Facility Barriers to Discharge: Continued Medical Work up  Expected Discharge Plan and Services Expected Discharge Plan: Skilled Nursing Facility In-house Referral: Clinical Social Work   Post Acute Care Choice: Skilled Nursing Facility                                         Social Determinants of Health (SDOH) Interventions    Readmission Risk Interventions No flowsheet data found.

## 2021-02-09 NOTE — Progress Notes (Signed)
Occupational Therapy Treatment Patient Details Name: Jeremy Browning MRN: [id number] DOB: [birth year] Today's Date: 02/09/2021    History of present illness Pt is a 60 yo male that presented from home with respiratory distress 12/25/2020. Underwent defibrillation and CPR, emergently intubated. During admission pt has had a cardiac cath, peg tube and tracheostomy placed, weaned from vent 5/26 AM. 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 Mildly restricted diffusion involving the cortex more diffusely over the right cerebral convexity favored to reflect an acute MCA territory infarct over global hypoxic injury given unilaterality".   OT comments  Upon entering the room, pt supine in bed and sleeping soundly. OT assisted pt with repositioning in bed secondary to lethargy and attempt to increase comfort. Pt remains asleep and unable to assist therapist. Use of lemon swabs to attempt to alert pt and he does briefly open eyes and says, " Just sleepy". OT attempting to continue to engage with pt but he is unable to remain awake. Cold cloth used on face and still not alerting. Bed alarm activated and all needs within reach. Bed alarm activated and all needs within reach upon exiting the room.     Follow Up Recommendations    CIR   Equipment Recommendations  Other (comment) (defer to next venue of care)    Recommendations for Other Services      Precautions / Restrictions Precautions Precautions: Fall Precaution Comments: trach, PEG              ADL either performed or assessed with clinical judgement     Cognition Arousal/Alertness: Lethargic                                                         Pertinent Vitals/ Pain       Pain Assessment: Faces Faces Pain Scale: No hurt         Frequency  Min 2X/week        Progress Toward Goals  OT Goals(current goals can now be found in the care plan section)   Progress towards OT goals:  (not progressing this session secondary to lethargy)  Acute Rehab OT Goals Patient Stated Goal: none stated OT Goal Formulation: With patient Time For Goal Achievement: 02/21/21 Potential to Achieve Goals: Fair  Plan Discharge plan remains appropriate;Frequency remains appropriate       AM-PAC OT "6 Clicks" Daily Activity     Outcome Measure   Help from another person eating meals?: A Lot Help from another person taking care of personal grooming?: A Lot Help from another person toileting, which includes using toliet, bedpan, or urinal?: Total Help from another person bathing (including washing, rinsing, drying)?: A Lot Help from another person to put on and taking off regular upper body clothing?: A Lot Help from another person to put on and taking off regular lower body clothing?: Total 6 Click Score: 10    End of Session    OT Visit Diagnosis: Other symptoms and signs involving the nervous system (R29.898);Other symptoms and signs involving cognitive function   Activity Tolerance Patient limited by lethargy   Patient Left in bed;with call bell/phone within reach;with bed alarm set   Nurse Communication Mobility status        Time: 9983-3825 OT Time Calculation (min): 11 min  Charges: OT General Charges $OT Visit: 1 Visit OT Treatments $Therapeutic Activity: 8-22 mins Jackquline Denmark, MS, OTR/L , CBIS ascom [phone]  02/09/21, 4:02 PM

## 2021-02-09 NOTE — Progress Notes (Addendum)
PT Cancellation Note  Patient Details Name: Jeremy Browning MRN: [id number] DOB: [birth year]   Cancelled Treatment:    Reason Eval/Treat Not Completed: Other (comment) pt lethargic, unable to maintain an awake state for longer than a few seconds despite several verbal and tactile prompts. PT to reassess as able.   [employer], SPT

## 2021-02-09 NOTE — Progress Notes (Signed)
  RE: Jeremy Browning Date of Birth: 07/02/1961 Date: 02/09/2021   To Whom It May Concern:  Please be advised that the above-named patient will require a short-term nursing home stay - anticipated 30 days or less for rehabilitation and strengthening.  The plan is for return home.

## 2021-02-09 NOTE — Progress Notes (Signed)
SLP Cancellation Note  Patient Details Name: Jeremy Browning MRN: [id number] DOB: Jul 06, 1961   Cancelled treatment:       Reason Eval/Treat Not Completed: Fatigue/lethargy limiting ability to participate   Sherina Stammer Dreama Saa 02/09/2021, 3:40 PM

## 2021-02-09 NOTE — Progress Notes (Signed)
Returned to see patient today.  Patient denies significant right knee pain at this time.  His right knee does not show any large effusion and is not tender.  Patient is lethargic but arousable.  No aspiration or immediate treatment needed for right knee pain and swelling which is secondary to osteoarthritis.  Reestablish physical therapy when medically appropriate.  We will sign off for now.

## 2021-02-10 DIAGNOSIS — L8990 Pressure ulcer of unspecified site, unspecified stage: Secondary | ICD-10-CM | POA: Insufficient documentation

## 2021-02-10 LAB — BASIC METABOLIC PANEL
Anion gap: 7 (ref 5–15)
BUN: 55 mg/dL — ABNORMAL HIGH (ref 8–23)
CO2: 25 mmol/L (ref 22–32)
Calcium: 9 mg/dL (ref 8.9–10.3)
Chloride: 112 mmol/L — ABNORMAL HIGH (ref 98–111)
Creatinine, Ser: 1.37 mg/dL — ABNORMAL HIGH (ref 0.61–1.24)
GFR, Estimated: 59 mL/min — ABNORMAL LOW (ref >60)
Glucose, Bld: 171 mg/dL — ABNORMAL HIGH (ref 70–99)
Potassium: 4.1 mmol/L (ref 3.5–5.1)
Sodium: 144 mmol/L (ref 135–145)

## 2021-02-10 LAB — CBC
HCT: 34.3 % — ABNORMAL LOW (ref 39.0–52.0)
Hemoglobin: 10.3 g/dL — ABNORMAL LOW (ref 13.0–17.0)
MCH: 24.3 pg — ABNORMAL LOW (ref 26.0–34.0)
MCHC: 30 g/dL (ref 30.0–36.0)
MCV: 80.9 fL (ref 80.0–100.0)
Platelets: 301 10*3/uL (ref 150–400)
RBC: 4.24 MIL/uL (ref 4.22–5.81)
RDW: 15.5 % (ref 11.5–15.5)
WBC: 12.7 10*3/uL — ABNORMAL HIGH (ref 4.0–10.5)
nRBC: 0 % (ref 0.0–0.2)

## 2021-02-10 LAB — GLUCOSE, CAPILLARY
Glucose-Capillary: 142 mg/dL — ABNORMAL HIGH (ref 70–99)
Glucose-Capillary: 150 mg/dL — ABNORMAL HIGH (ref 70–99)
Glucose-Capillary: 170 mg/dL — ABNORMAL HIGH (ref 70–99)
Glucose-Capillary: 174 mg/dL — ABNORMAL HIGH (ref 70–99)
Glucose-Capillary: 182 mg/dL — ABNORMAL HIGH (ref 70–99)
Glucose-Capillary: 200 mg/dL — ABNORMAL HIGH (ref 70–99)
Glucose-Capillary: 226 mg/dL — ABNORMAL HIGH (ref 70–99)

## 2021-02-10 LAB — PHOSPHORUS: Phosphorus: 3.9 mg/dL (ref 2.5–4.6)

## 2021-02-10 LAB — MAGNESIUM: Magnesium: 2.5 mg/dL — ABNORMAL HIGH (ref 1.7–2.4)

## 2021-02-10 MED ORDER — NEPRO/CARBSTEADY PO LIQD
1600.0000 mL | ORAL | Status: DC
  Administered 2021-02-10 – 2021-02-15 (×8): 1600 mL

## 2021-02-10 NOTE — Progress Notes (Signed)
Nutrition Follow Up Note   DOCUMENTATION CODES:   Obesity unspecified  INTERVENTION:   Change to nocturnal feeds of Nepro 1.8 @ 124m/hr x 16 hours (1700-0900)  Continue Free water flushes to 2039mq4 hours   Regimen provides 2880kcal/day, 130g/day protein and 236372may free water   NUTRITION DIAGNOSIS:   Inadequate oral intake related to inability to eat (pt sedated and ventilated) as evidenced by NPO status.  GOAL:   Patient will meet greater than or equal to 90% of their needs  -met with tube feeds   MONITOR:   Labs, Weight trends, TF tolerance, Skin, I & O's  ASSESSMENT:   [age]o. black male with diabetes mellitus type II, hypertension and congestive heart failure who [facility] for cardiogenic shock and flash pulmonary edema  Pt s/p PEG tube 5/24  Pt s/p tracheostomy 5/25  Pt seen by SLP and placed on a dysphagia 1 diet 6/22. Pt ate 50% of his breakfast this morning with SLP. Pt ate 15% of mashed potato/beef/carrots,15% of an orange sherbet Magic Cup and 25% of mashed berries along with 100% of his tea for lunch today. RD will switch pt over to nocturnal feeds to try and encourage increased oral intake during the day. Will keep patient at goal rate for now as pt has lost a significant amount of weight since admission. As patient's oral intake improves, calorie counts can be performed in order to adjust pt's tube feeds accordingly. Pt will be able to discharge to SNF with PEG tube and can be weaned from there as appropriate.   Medications reviewed and include: aspirin, colace, lovenox, pepcid, insulin, miralax, prednisone   Labs reviewed: Na 144 wnl, K 4.1 wnl, BUN 55(H), creat 1.37(H), P 3.9 wnl, Mg 2.5(H) Wbc- 12.7(H), Hgb 10.3(L), Hct 34.3(L), MCH 24.3(L) cbgs- 150, 174, 142, 200 x 24 hrs  Diet Order:   Diet Order             DIET - DYS 1 Room service appropriate? Yes; Fluid consistency: Thin  Diet effective now                   EDUCATION NEEDS:   No education needs have been identified at this time  Skin:  Skin Assessment: Reviewed RN Assessment: incision neck s/p trach   Last BM:  6/22- type 7  Height:   Ht Readings from Last 1 Encounters:  02/05/21 6' (1.829 m)    Weight:   Wt Readings from Last 1 Encounters:  02/10/21 111.3 kg    BMI:  Body mass index is 33.28 kg/m.  Estimated Nutritional Needs:   Kcal:  2700-3000kcal/day  Protein:  125-140g/day  Fluid:  2.4-2.7L/day  CasKoleen Distance, RD, LDN Please refer to [facility] and/or RD on-call/weekend/after hours pager

## 2021-02-10 NOTE — Progress Notes (Signed)
  Speech Language Pathology Treatment: Dysphagia;Cognitive-Linquistic  Patient Details Name: Jeremy Browning MRN: [id number] DOB: November 26, 1960 Today's Date: 02/10/2021 Time: 0910-1000 SLP Time Calculation (min) (ACUTE ONLY): 50 min  Assessment / Plan / Recommendation Clinical Impression  Pt was awake, alert, doodling on art pad. His youngest sister was present during session. Pt continues to be very distractible and requires cues for attention to talk. His speech is often tangential and confabulatory. Education provided to pt's sister on ways to support pt during this phase of cognitive recovery.  Pt was able to hold his cup and feed himself thin liquids with decreased left anterior spillage than when SLP held cup to pt's lips. Pt responsive to cues for left lingual sweep to clear puree. Pt was free of any overt s/s of aspiration or increase in WOB. He consumed 50% and 8 oz thin liquids. Secure chat sent to oncall ENT to discuss decannulation. Secure chat sent to RD regarding tube feeds.    HPI HPI: Pt is a 60 yo male that presented from home with respiratory distress 12/25/2020. Underwent defibrillation and CPR, emergently intubated. During admission pt has had a cardiac cath, peg tube and tracheostomy placed, weaned from vent 01/13/2021. 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 Mildly restricted diffusion involving the cortex more diffusely over the right cerebral convexity favored to reflect an acute MCA territory infarct over global hypoxic injury given unilaterality".  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 Pt currently has a #6 cuffless Shiley with PMSV worn during all waking hours.      SLP Plan  Continue with current plan of care       Recommendations  Diet recommendations: Dysphagia 1 (puree);Thin  liquid Liquids provided via: Cup Medication Administration: Crushed with puree Supervision: Staff to assist with self feeding;Full supervision/cueing for compensatory strategies Compensations: Minimize environmental distractions;Slow rate;Small sips/bites;Lingual sweep for clearance of pocketing Postural Changes and/or Swallow Maneuvers: Seated upright 90 degrees;Upright 30-60 min after meal      MD: Please consider changing trach tube to :  (decanulation)         Oral Care Recommendations: Oral care prior to ice chip/H20;Oral care QID Follow up Recommendations: Skilled Nursing facility SLP Visit Diagnosis: Dysphagia, oropharyngeal phase (R13.12);Cognitive communication deficit (R41.841) Plan: Continue with current plan of care       GO               Velecia Ovitt B. Dreama Saa M.S., CCC-SLP, [facility] Speech-Language Pathologist Rehabilitation Services Office [phone]  Soni Kegel Dreama Saa 02/10/2021, 1:40 PM

## 2021-02-10 NOTE — Progress Notes (Signed)
[facility] Progress Note  Patient: Jeremy Browning    ZOX:[id number]  DOA: 12/25/2020     Date of Service: the patient was seen and examined on 02/10/2021  Chief Complaint  Patient presents with   Cardiac Arrest   Brief hospital course: Medical records reviewed and are as summarized below:   Jeremy Browning is an 60 y.o. male with obesity, DM2, HTN, combined CHF who was admitted on 12/25/2020 with respiratory distress.  He was intubated in the ER also had VT requiring epi/cardioversion.  Hospital course complicated by persistent vent dependent respiratory failure eventually requiring tracheostomy, anoxic encephalopathy.Placement issue.  Most recent issue was pain in right knee.  Currently further plan is continue current treatment until placement.  Assessment and Plan: Active Problems:   Cardiogenic shock (HCC)   Cardiac arrest with successful resuscitation ([place])   Flash pulmonary edema ([place])   Cardiopulmonary arrest with successful resuscitation ([place])   Acute respiratory failure with hypoxia and hypercapnia (HCC)   Acute renal failure (HCC)   Transaminitis   Second degree heart block   Lactic acidosis   Aspiration pneumonia (HCC)   HFrEF (heart failure with reduced ejection fraction) (HCC)   Severe hypoxic-ischemic encephalopathy   SOB (shortness of breath)   Oropharyngeal dysphagia     Acute hypoxic respiratory failure in the setting of cardiac arrest, pulmonary edema, Proteus pneumonia  -treated while in the ICU, completed a course of antibiotics with ceftriaxone and Unasyn.  Has not required them for the past week, tolerating trach collar -Continue aggressive pulmonary toilet, continue bronchodilators -Respiratory status is stable 6/23 d/w ENT rec decanulation and contacted RT and  RT decannulated pt to RA. Tolerated well. Gauze placed over stoma Sating 100% on RA. Bilateral rhonchi breath sounds.   Cardiac arrest / V. Tach / non-STEMI / acute combined systolic and  diastolic CHF /cardiogenic shock --requiring pressors while in the ICU, currently resolved. -- Cardiology consulted and underwent a cardiac cath on 5/7 which showed normal coronaries. -- A 2D echo done on 5/9 showed an EF of 45-50% with global hypokinesis of the left ventricle.  It also showed grade 1 diastolic dysfunction.  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 Recommends  steroids for now.-- start to wean down as the IV steroids appeared to cause some psychosis/behavioral issues   Elevated LFTs -noticed on 6/11, overall trending down.   --Hepatitis panel negative, right upper quadrant ultrasound negative.  --Discontinued Tylenol and statin   Anoxic brain encephalopathy, CVAs-high suspicion based on the MRI, clinically stable currently.   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  Neurology consulted and evaluated patient while hospitalized.  His left side is flaccid.  Mental status seems to be improving, he is more interactive, and has been talking more for the last couple of days   Acute kidney injury -baseline creatinine seems to be around 1.1-1.2   Anemia of chronic disease -hemoglobin stable, no bleeding   Nutrition--cont PEG feeding at nighttime as patient's swallowing is improving --6/15-- patient did not tolerate swallow study/evaluation by speech therapy Continue oral diet along with nocturnal PEG tube feeding to meet calorie requirement.     Obesity, Body mass index is 31.57 kg/m.   Fever (new 6/20) x ray negative for PNA -no further  events off abx -continue to monitor 6/22 afebrile, WBC 16.7 still elevated Blood culture no growth till date,    Body mass index is 33.28 kg/m.  Nutrition Problem: Inadequate oral  intake Etiology: inability to eat (pt sedated and ventilated) Interventions:    Pressure Injury Buttocks Left (Active)     Location: Buttocks  Location Orientation: Left  Staging:   Wound Description (Comments):   Present on Admission:      Pressure Injury 02/09/21 Buttocks Medial Stage 2 -  Partial thickness loss of dermis presenting as a shallow open injury with a red, pink wound bed without slough. (Active)  02/09/21 1209  Location: Buttocks  Location Orientation: Medial  Staging: Stage 2 -  Partial thickness loss of dermis presenting as a shallow open injury with a red, pink wound bed without slough.  Wound Description (Comments):   Present on Admission:      Pressure Injury 02/09/21 Buttocks Left Stage 2 -  Partial thickness loss of dermis presenting as a shallow open injury with a red, pink wound bed without slough. (Active)  02/09/21 1213  Location: Buttocks  Location Orientation: Left  Staging: Stage 2 -  Partial thickness loss of dermis presenting as a shallow open injury with a red, pink wound bed without slough.  Wound Description (Comments):   Present on Admission:      Diet: peg tube feeding DVT Prophylaxis: Subcutaneous Lovenox   Advance goals of care discussion: DNR  Family Communication: family was not present at bedside, at the time of interview.  The pt provided permission to discuss medical plan with the family. Opportunity was given to ask question and all questions were answered satisfactorily.   Disposition:  Pt is from home, admitted with cardiogenic shock, anoxic brain injury, CVA, s/p trach and PEG tube, which precludes a safe discharge. Discharge to [facility] versus SNF, when bed available.  Difficult to place  Subjective: No active overnight issues, patient has some cognitive issues, said that he is eating well now.  Denied any other active issues, no chest palpitation, no abdominal pain.     Physical Exam: General:  alert, NAD, lying in bed  comfortably.  Appear in no  distress, affect appropriate Eyes: PERRLA ENT: Oral Mucosa Clear, moist  Neck: no JVD, s/p tracheostomy Cardiovascular: S1 and S2 Present, no Murmur,  Respiratory: good respiratory effort, Bilateral Air entry equal and Decreased, no Crackles, no wheezes Abdomen: Bowel Sound present, Soft, obese and no tenderness, s/p PEG tube Skin: no rashes Extremities: mild Pedal edema, no calf tenderness Neurologic: awake and alert, residual left-sided weakness due to CVA/anoxic brain injury Gait not checked due to patient safety concerns  Vitals:   02/10/21 0407 02/10/21 0539 02/10/21 0833 02/10/21 0857  BP:  (!) 143/62 (!) 141/92   Pulse:  (!) 59 62   Resp:  20 19   Temp:  98.5 F (36.9 C) 98.4 F (36.9 C)   TempSrc:  Oral Oral   SpO2:  100% 95% 100%  Weight: 111.3 kg     Height:        Intake/Output Summary (Last 24 hours) at 02/10/2021 1413 Last data filed at 02/10/2021 1340 Gross per 24 hour  Intake 1514 ml  Output 250 ml  Net 1264 ml   Filed Weights   02/07/21 0418 02/09/21 0528 02/10/21 0407  Weight: 105.6 kg 110 kg 111.3 kg    Data Reviewed: I have personally reviewed and interpreted daily labs, tele strips, imagings as discussed above. I reviewed  all nursing notes, pharmacy notes, vitals, pertinent old records I have discussed plan of care as described above with RN and patient/family.  CBC: Recent Labs  Lab 02/05/21 0617 02/06/21 0556 02/08/21 0442 02/09/21 0430 02/10/21 0452  WBC 12.2* 12.9* 16.0* 16.7* 12.7*  NEUTROABS  --   --   --  9.3*  --   HGB 10.2* 9.6* 10.7* 10.4* 10.3*  HCT 32.1* 30.5* 34.2* 33.1* 34.3*  MCV 78.5* 79.6* 78.1* 78.3* 80.9  PLT 316 328 349 255 287   Basic Metabolic Panel: Recent Labs  Lab 02/06/21 0556 02/08/21 0442 02/10/21 0452  NA 144 141 144  K 4.0 4.1 4.1  CL 113* 109 112*  CO2 '25 23 25  ' GLUCOSE 157* 179* 171*  BUN 61* 58* 55*  CREATININE 1.26* 1.26* 1.37*  CALCIUM 8.9 8.7* 9.0  MG  --   --   2.5*  PHOS  --   --  3.9    Studies: No results found.  Scheduled Meds:  aspirin  81 mg Per Tube Daily   carvedilol  25 mg Per Tube BID WC   chlorhexidine gluconate (MEDLINE KIT)  15 mL Mouth Rinse BID   Chlorhexidine Gluconate Cloth  6 each Topical Q0600   diclofenac Sodium  2 g Topical QID   docusate  100 mg Per Tube BID   enoxaparin (LOVENOX) injection  0.5 mg/kg Subcutaneous Q24H   famotidine  20 mg Per Tube Daily   feeding supplement (NEPRO CARB STEADY)  1,600 mL Per Tube Q24H   free water  200 mL Per Tube Q6H   hydrALAZINE  50 mg Per Tube TID   insulin aspart  0-15 Units Subcutaneous TID AC & HS   mouth rinse  15 mL Mouth Rinse 10 times per day   polyethylene glycol  17 g Per Tube Daily   predniSONE  40 mg Per Tube Q breakfast   Continuous Infusions:   PRN Meds: acetaminophen (TYLENOL) oral liquid 160 mg/5 mL, albuterol, docusate, hydrALAZINE, lip balm, oxyCODONE, polyethylene glycol, polyvinyl alcohol, sodium chloride flush, sodium chloride flush  Time spent: 35 minutes  Author: Val Riles. MD [facility] 02/10/2021 2:13 PM  To reach On-call, see care teams to locate the attending and reach out to them via [email or web address]. If 7PM-7AM, please contact night-coverage If you still have difficulty reaching the attending provider, please page the [facility] (Director on Call) for [facility] on amion for assistance.

## 2021-02-10 NOTE — Progress Notes (Signed)
RT decannulated pt to RA. Tolerated well. Gauze placed over stoma Sating 100% on RA. Bilateral rhonchi breath sounds.

## 2021-02-11 LAB — GLUCOSE, CAPILLARY
Glucose-Capillary: 170 mg/dL — ABNORMAL HIGH (ref 70–99)
Glucose-Capillary: 144 mg/dL — ABNORMAL HIGH (ref 70–99)
Glucose-Capillary: 230 mg/dL — ABNORMAL HIGH (ref 70–99)
Glucose-Capillary: 192 mg/dL — ABNORMAL HIGH (ref 70–99)
Glucose-Capillary: 195 mg/dL — ABNORMAL HIGH (ref 70–99)

## 2021-02-11 MED ORDER — BACITRACIN-NEOMYCIN-POLYMYXIN OINTMENT TUBE
TOPICAL_OINTMENT | Freq: Two times a day (BID) | CUTANEOUS | Status: DC
  Administered 2021-02-12 – 2021-02-19 (×3): 1 via TOPICAL
  Filled 2021-02-11 (×2): qty 14.17

## 2021-02-11 NOTE — Progress Notes (Signed)
Occupational Therapy Treatment Patient Details Name: Jeremy Browning MRN: [id number] DOB: 01/09/1961 Today's Date: 02/11/2021    History of present illness Pt is a 60 yo male that presented from home with respiratory distress 12/25/2020. Underwent defibrillation and CPR, emergently intubated. During admission pt has had a cardiac cath, peg tube and tracheostomy placed, weaned from vent 5/26 AM. 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 Mildly restricted diffusion involving the cortex more diffusely over the right cerebral convexity favored to reflect an acute MCA territory infarct over global hypoxic injury given unilaterality".   OT comments  Pt seen for skilled co- tx to address sitting balance, cognition, NMR, self care,strengthening, and functional transfer. Pt EOB with PT upon entering the room, and agreeable to therapeutic intervention. Pt following commands with mod multimodal cuing and increased time to process initiation and sequencing. Multiple attempts to stand from elevated surface with three muskateers, front facing with B knees blocked, and use sheet around hips to facilitate upright position. Pt briefly cleared buttocks from bed. Pt did notify staff of need for toileting. Pt returns to bed with mod A and placed on bed pan. No BM this session and pt performs supine >sit with elevated HOB and use of rail at min A level for L UE. Pt leaning onto L UE for weight bearing with second person to place slide board. Slide board transfer with max A of 2 for safety and pt also giving effort to help with slide and following commands for hand placement. Pt demonstrates anterior weight shift on command with increased time from recliner chair. Bobath technique utilized to position pt further back in chair with pt giving mod effort. All needs within reach. Chair alarm activated and hoyer sling under pt per RN request. Pt making excellent progress towards goals.    Follow Up Recommendations  SNF;Supervision/Assistance - 24 hour    Equipment Recommendations  Other (comment) (defer top next venue of care)       Precautions / Restrictions Precautions Precautions: Fall Precaution Comments: PEG       Mobility Bed Mobility Overal bed mobility: Needs Assistance Bed Mobility: Supine to Sit;Sit to Supine     Supine to sit: Mod assist;Min assist Sit to supine: Mod assist   General bed mobility comments: Supine <> sit x 2 attempts with increased effort on second attempt and pt performing supine >sit with min A for L LE.    Transfers Overall transfer level: Needs assistance   Transfers: Lateral/Scoot Transfers          Lateral/Scoot Transfers: Max assist;+2 physical assistance;With slide board General transfer comment: multiple attempts at sit <>stand wiht +2-3 assist and pt clears buttocks briefly on one attempt. Good effort.    Balance Overall balance assessment: Needs assistance Sitting-balance support: Feet supported;Bilateral upper extremity supported Sitting balance-Leahy Scale: Fair                                     ADL either performed or assessed with clinical judgement      Cognition Arousal/Alertness: Awake/alert Behavior During Therapy: Flat affect Overall Cognitive Status: Impaired/Different from baseline Area of Impairment: Rancho level;Orientation               Rancho Levels of Cognitive Functioning Ranch[place] Los Amigos Scales of Cognitive Functioning: Confused/inappropriate/non-agitated Orientation Level: Disoriented to;Time;Situation  Pertinent Vitals/ Pain       Pain Assessment: 0-10 Faces Pain Scale: No hurt         Frequency  Min 2X/week        Progress Toward Goals  OT Goals(current goals can now be found in the care plan section)  Progress towards OT goals: Progressing toward goals  Acute Rehab OT Goals Patient Stated Goal: to  move better OT Goal Formulation: With patient Time For Goal Achievement: 02/21/21 Potential to Achieve Goals: Fair  Plan Discharge plan remains appropriate;Frequency remains appropriate    Co-evaluation      Reason for Co-Treatment: Complexity of the patient's impairments (multi-system involvement);Necessary to address cognition/behavior during functional activity;For patient/therapist safety;To address functional/ADL transfers PT goals addressed during session: Mobility/safety with mobility OT goals addressed during session: ADL's and self-care      AM-PAC OT "6 Clicks" Daily Activity     Outcome Measure   Help from another person eating meals?: A Lot Help from another person taking care of personal grooming?: A Lot Help from another person toileting, which includes using toliet, bedpan, or urinal?: Total Help from another person bathing (including washing, rinsing, drying)?: A Lot Help from another person to put on and taking off regular upper body clothing?: A Lot Help from another person to put on and taking off regular lower body clothing?: Total 6 Click Score: 10    End of Session    OT Visit Diagnosis: Other symptoms and signs involving the nervous system (R29.898);Other symptoms and signs involving cognitive function   Activity Tolerance Patient tolerated treatment well   Patient Left in chair;with call bell/phone within reach;with bed alarm set   Nurse Communication Mobility status        Time: 9024-0973 OT Time Calculation (min): 43 min  Charges: OT General Charges $OT Visit: 1 Visit OT Treatments $Self Care/Home Management : 8-22 mins $Neuromuscular Re-education: 8-22 mins  Jackquline Denmark, MS, OTR/L , CBIS ascom [phone]  02/11/21, 3:41 PM

## 2021-02-11 NOTE — TOC Progression Note (Signed)
Transition of Care [facility]) - Progression Note    Patient Details  Name: Jeremy Browning MRN: [id number] Date of Birth: [birth year]  [facility] CM/SW Contact  Hetty Ely, RN Phone Number: 02/11/2021, 1:43 PM  Clinical Narrative:  New bedsearch started, spoke with Sister yesterday, 02/10/21 in length about the SNF request process, will keep her updated if any bed acceptance.     Expected Discharge Plan: Skilled Nursing Facility Barriers to Discharge: Continued Medical Work up  Expected Discharge Plan and Services Expected Discharge Plan: Skilled Nursing Facility In-house Referral: Clinical Social Work   Post Acute Care Choice: Skilled Nursing Facility                                         Social Determinants of Health (SDOH) Interventions    Readmission Risk Interventions No flowsheet data found.

## 2021-02-11 NOTE — Progress Notes (Signed)
[facility] Progress Note  Patient: Jeremy Browning    IRS:[id number]  DOA: 12/25/2020     Date of Service: the patient was seen and examined on 02/11/2021  Chief Complaint  Patient presents with   Cardiac Arrest   Brief hospital course: Medical records reviewed and are as summarized below:   Youssef Footman is an 60 y.o. male with obesity, DM2, HTN, combined CHF who was admitted on 12/25/2020 with respiratory distress.  He was intubated in the ER also had VT requiring epi/cardioversion.  Hospital course complicated by persistent vent dependent respiratory failure eventually requiring tracheostomy, anoxic encephalopathy.Placement issue.  Most recent issue was pain in right knee.  Currently further plan is continue current treatment until placement.  Assessment and Plan: Active Problems:   Cardiogenic shock (HCC)   Cardiac arrest with successful resuscitation ([place])   Flash pulmonary edema ([place])   Cardiopulmonary arrest with successful resuscitation ([place])   Acute respiratory failure with hypoxia and hypercapnia (HCC)   Acute renal failure (HCC)   Transaminitis   Second degree heart block   Lactic acidosis   Aspiration pneumonia (HCC)   HFrEF (heart failure with reduced ejection fraction) (HCC)   Severe hypoxic-ischemic encephalopathy   SOB (shortness of breath)   Oropharyngeal dysphagia     Acute hypoxic respiratory failure in the setting of cardiac arrest, pulmonary edema, Proteus pneumonia  -treated while in the ICU, completed a course of antibiotics with ceftriaxone and Unasyn.  Has not required them for the past week, tolerating trach collar -Continue aggressive pulmonary toilet, continue bronchodilators -Respiratory status is stable 6/23 d/w ENT rec decanulation and contacted RT and  RT decannulated pt to RA. Tolerated well. Gauze placed over stoma Sating 100% on RA. Bilateral rhonchi breath sounds.   Cardiac arrest / V. Tach / non-STEMI / acute combined systolic and  diastolic CHF /cardiogenic shock --requiring pressors while in the ICU, currently resolved. -- Cardiology consulted and underwent a cardiac cath on 5/7 which showed normal coronaries. -- A 2D echo done on 5/9 showed an EF of 45-50% with global hypokinesis of the left ventricle.  It also showed grade 1 diastolic dysfunction.  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 Recommends  steroids for now.-- start to wean down as the IV steroids appeared to cause some psychosis/behavioral issues Left knee ulcer, mild puss, continue dressing and apply bacitracin ointment twice daily.      Elevated LFTs -noticed on 6/11, overall trending down.   --Hepatitis panel negative, right upper quadrant ultrasound negative.  --Discontinued Tylenol and statin Follow repeat LFTs   Anoxic brain encephalopathy, CVAs-high suspicion based on the MRI, clinically stable currently.   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  Neurology consulted and evaluated patient while hospitalized.  His left side is flaccid.  Mental status seems to be improving, he is more interactive, and has been talking more for the last couple of days   Acute kidney injury -baseline creatinine seems to be around 1.1-1.2   Anemia of chronic disease -hemoglobin stable, no bleeding   Nutrition--cont PEG feeding at nighttime as patient's swallowing is improving --6/15-- patient did not tolerate swallow study/evaluation by speech therapy Continue oral diet along with nocturnal PEG tube feeding to meet calorie requirement.  Obesity, Body mass index is 31.57 kg/m.   Fever (new 6/20) x ray negative for PNA -no further events off abx -continue to monitor 6/22 afebrile, WBC 16.7 still  elevated Blood culture no growth till date,    Body mass index is 32.98 kg/m.  Nutrition Problem: Inadequate oral intake Etiology: inability to eat (pt sedated and ventilated) Interventions:    Pressure Injury Buttocks Left (Active)     Location: Buttocks  Location Orientation: Left  Staging:   Wound Description (Comments):   Present on Admission:      Pressure Injury 02/09/21 Buttocks Medial Stage 2 -  Partial thickness loss of dermis presenting as a shallow open injury with a red, pink wound bed without slough. (Active)  02/09/21 1209  Location: Buttocks  Location Orientation: Medial  Staging: Stage 2 -  Partial thickness loss of dermis presenting as a shallow open injury with a red, pink wound bed without slough.  Wound Description (Comments):   Present on Admission:      Pressure Injury 02/09/21 Buttocks Left Stage 2 -  Partial thickness loss of dermis presenting as a shallow open injury with a red, pink wound bed without slough. (Active)  02/09/21 1213  Location: Buttocks  Location Orientation: Left  Staging: Stage 2 -  Partial thickness loss of dermis presenting as a shallow open injury with a red, pink wound bed without slough.  Wound Description (Comments):   Present on Admission:      Diet: peg tube feeding DVT Prophylaxis: Subcutaneous Lovenox   Advance goals of care discussion: DNR  Family Communication: family was not present at bedside, at the time of interview.  The pt provided permission to discuss medical plan with the family. Opportunity was given to ask question and all questions were answered satisfactorily.   Disposition:  Pt is from home, admitted with cardiogenic shock, anoxic brain injury, CVA, s/p trach and PEG tube, which precludes a safe discharge. Discharge to [facility] versus SNF, when bed available.  Difficult to place  Subjective: No active overnight issues, patient has some cognitive issues,  Denied any other active issues, no chest  palpitation, no abdominal pain.     Physical Exam: General:  alert, NAD, lying in bed comfortably.  Appear in no  distress, affect appropriate Eyes: PERRLA ENT: Oral Mucosa Clear, moist  Neck: no JVD, s/p tracheostomy, decanulated, covered with guaze   Cardiovascular: S1 and S2 Present, no Murmur,  Respiratory: good respiratory effort, Bilateral Air entry equal and Decreased, no Crackles, no wheezes Abdomen: Bowel Sound present, Soft, obese and no tenderness, s/p PEG tube Skin: Pressure ulcers, left knee ulcer Extremities: mild Pedal edema, no calf tenderness Neurologic: awake and alert, residual left-sided weakness due to CVA/anoxic brain injury Gait not checked due to patient safety concerns  Vitals:   02/10/21 1952 02/11/21 0409 02/11/21 0842 02/11/21 1207  BP: (!) 110/50 (!) 133/57 (!) 153/48 (!) 111/48  Pulse: 65 64 (!) 55 63  Resp: '19 19 18 17  ' Temp: 99 F (37.2 C) 98.8 F (37.1 C) 98.4 F (36.9 C) 98.1 F (36.7 C)  TempSrc: Oral Oral Oral Oral  SpO2: 99% 98% 98% 100%  Weight:  110.3 kg    Height:        Intake/Output Summary (Last 24 hours) at 02/11/2021 1351 Last data filed at 02/11/2021 0950 Gross per 24 hour  Intake 120 ml  Output --  Net 120 ml   Filed Weights   02/09/21 0528 02/10/21 0407 02/11/21 0409  Weight: 110 kg 111.3 kg 110.3 kg    Data Reviewed: I have personally reviewed and interpreted daily labs, tele strips, imagings as discussed above. I reviewed all nursing notes, pharmacy notes, vitals, pertinent old records I have discussed plan of care as described above with RN and patient/family.  CBC: Recent Labs  Lab 02/05/21 0617 02/06/21 0556 02/08/21 0442 02/09/21 0430 02/10/21 0452  WBC 12.2* 12.9* 16.0* 16.7* 12.7*  NEUTROABS  --   --   --  9.3*  --   HGB 10.2* 9.6* 10.7* 10.4* 10.3*  HCT 32.1* 30.5* 34.2* 33.1* 34.3*  MCV 78.5* 79.6* 78.1* 78.3* 80.9  PLT 316 328 349 255 794   Basic Metabolic Panel: Recent Labs  Lab 02/06/21 0556  02/08/21 0442 02/10/21 0452  NA 144 141 144  K 4.0 4.1 4.1  CL 113* 109 112*  CO2 '25 23 25  ' GLUCOSE 157* 179* 171*  BUN 61* 58* 55*  CREATININE 1.26* 1.26* 1.37*  CALCIUM 8.9 8.7* 9.0  MG  --   --  2.5*  PHOS  --   --  3.9    Studies: No results found.  Scheduled Meds:  aspirin  81 mg Per Tube Daily   carvedilol  25 mg Per Tube BID WC   chlorhexidine gluconate (MEDLINE KIT)  15 mL Mouth Rinse BID   Chlorhexidine Gluconate Cloth  6 each Topical Q0600   diclofenac Sodium  2 g Topical QID   docusate  100 mg Per Tube BID   enoxaparin (LOVENOX) injection  0.5 mg/kg Subcutaneous Q24H   famotidine  20 mg Per Tube Daily   feeding supplement (NEPRO CARB STEADY)  1,600 mL Per Tube Q24H   free water  200 mL Per Tube Q6H   hydrALAZINE  50 mg Per Tube TID   insulin aspart  0-15 Units Subcutaneous TID AC & HS   mouth rinse  15 mL Mouth Rinse 10 times per day   polyethylene glycol  17 g Per Tube Daily   predniSONE  40 mg Per Tube Q breakfast   Continuous Infusions:   PRN Meds: acetaminophen (TYLENOL) oral liquid 160 mg/5 mL, albuterol, docusate, hydrALAZINE, lip balm, oxyCODONE, polyethylene glycol, polyvinyl alcohol, sodium chloride flush, sodium chloride flush  Time spent: 35 minutes  Author: Val Riles. MD [facility] 02/11/2021 1:51 PM  To reach On-call, see care teams to locate the attending and reach out to them via [email or web address]. If 7PM-7AM, please contact night-coverage If you still have difficulty reaching the attending provider, please page the [facility] (Director on Call) for [facility] on amion for assistance.

## 2021-02-11 NOTE — Progress Notes (Signed)
Physical Therapy Re-Evaluation and Goal Update Patient Details Name: Jeremy Browning MRN: [id number] DOB: 1960/09/21 Today's Date: 02/11/2021    History of Present Illness Pt is a 60 yo male that presented from home with respiratory distress 12/25/2020. Underwent defibrillation and CPR, emergently intubated. During admission pt has had a cardiac cath, peg tube and tracheostomy placed, weaned from vent 5/26 AM. 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 Mildly restricted diffusion involving the cortex more diffusely over the right cerebral convexity favored to reflect an acute MCA territory infarct over global hypoxic injury given unilaterality".    PT Comments    Patient alert, agreeable to PT, displaying signs of RLAS V. Seen as a co-treat with OT to maximize pt endurance, activity tolerance, function, and transfers. Multiple attempts at sit <>stand with +2-3 assist and pt clears buttocks briefly on one attempt, pt with good effort throughout and remained motivated. This session with lateral scoot transfer pt able to maintain seated balance and weight shift some weight onto Les as well as utilize RUE to assist with transfer. Up in chair with needs in reach. Family at bedside. Recommendation remains appropriate to maximize patient outcomes, though noted family has chosen SNF.   Goals updated this session and modified as appropriate, pt has made excellent progress.     Follow Up Recommendations  CIR     Equipment Recommendations  None recommended by PT    Recommendations for Other Services Speech consult     Precautions / Restrictions Precautions Precautions: Fall Precaution Comments: PEG Restrictions Weight Bearing Restrictions: No    Mobility  Bed Mobility Overal bed mobility: Needs Assistance Bed Mobility: Supine to Sit;Sit to Supine;Rolling     Supine to sit: Min assist;HOB elevated;Mod assist Sit to supine: HOB elevated;Min  assist;+2 for safety/equipment;Mod assist   General bed mobility comments: Supine <> sit x 2 attempts with increased effort on second attempt and pt performing supine >sit with min A for L LE.    Transfers Overall transfer level: Needs assistance Equipment used: Sliding board Transfers: Lateral/Scoot Transfers          Lateral/Scoot Transfers: Max assist;+2 physical assistance;With slide board General transfer comment: multiple attempts at sit <>stand wiht +2-3 assist and pt clears buttocks briefly on one attempt. Good effort.  Ambulation/Gait                 Stairs             Wheelchair Mobility    Modified Rankin (Stroke Patients Only)       Balance Overall balance assessment: Needs assistance Sitting-balance support: Feet supported;Bilateral upper extremity supported Sitting balance-Leahy Scale: Fair Sitting balance - Comments: supervision/CGA for seated balance                                    Cognition Arousal/Alertness: Awake/alert Behavior During Therapy: Flat affect Overall Cognitive Status: Impaired/Different from baseline Area of Impairment: Rancho level;Orientation               Rancho Levels of Cognitive Functioning Ranch[place] Mirant Scales of Cognitive Functioning: Confused/inappropriate/non-agitated Orientation Level: Disoriented to;Time;Situation             General Comments: oriented to self, place      Exercises      General Comments General comments (skin integrity, edema, etc.): on room air, spO2 monitored continuously >95%  Pertinent Vitals/Pain Pain Assessment: Faces Faces Pain Scale: No hurt    Home Living                      Prior Function            PT Goals (current goals can now be found in the care plan section) Acute Rehab PT Goals Patient Stated Goal: to move better Progress towards PT goals: Progressing toward goals    Frequency    Min 2X/week      PT  Plan Current plan remains appropriate    Co-evaluation PT/OT/SLP Co-Evaluation/Treatment: Yes Reason for Co-Treatment: Complexity of the patient's impairments (multi-system involvement);Necessary to address cognition/behavior during functional activity;For patient/therapist safety;To address functional/ADL transfers PT goals addressed during session: Mobility/safety with mobility;Proper use of DME;Balance OT goals addressed during session: ADL's and self-care      AM-PAC PT "6 Clicks" Mobility   Outcome Measure  Help needed turning from your back to your side while in a flat bed without using bedrails?: A Lot Help needed moving from lying on your back to sitting on the side of a flat bed without using bedrails?: A Lot Help needed moving to and from a bed to a chair (including a wheelchair)?: Total Help needed standing up from a chair using your arms (e.g., wheelchair or bedside chair)?: Total Help needed to walk in hospital room?: Total Help needed climbing 3-5 steps with a railing? : Total 6 Click Score: 8    End of Session Equipment Utilized During Treatment: Oxygen (5L) Activity Tolerance: Patient tolerated treatment well Patient left: in chair;with chair alarm set;with call bell/phone within reach Nurse Communication: Mobility status PT Visit Diagnosis: Other abnormalities of gait and mobility (R26.89);Muscle weakness (generalized) (M62.81);Other symptoms and signs involving the nervous system (V78.588)     Time: 5027-7412 PT Time Calculation (min) (ACUTE ONLY): 56 min  Charges:  $Therapeutic Exercise: 23-37 mins                     Olga Coaster PT, DPT 4:18 PM,02/11/21

## 2021-02-11 NOTE — Plan of Care (Signed)
  Problem: Clinical Measurements: Goal: Ability to maintain clinical measurements within normal limits will improve Outcome: Progressing Goal: Will remain free from infection Outcome: Progressing Goal: Diagnostic test results will improve Outcome: Progressing Goal: Respiratory complications will improve Outcome: Progressing Goal: Cardiovascular complication will be avoided Outcome: Progressing   Problem: Nutrition: Goal: Adequate nutrition will be maintained Outcome: Progressing   Problem: Elimination: Goal: Will not experience complications related to bowel motility Outcome: Progressing Goal: Will not experience complications related to urinary retention Outcome: Progressing   Problem: Pain Managment: Goal: General experience of comfort will improve Outcome: Progressing   Problem: Safety: Goal: Ability to remain free from injury will improve Outcome: Progressing   Problem: Skin Integrity: Goal: Risk for impaired skin integrity will decrease Outcome: Progressing   Problem: Activity: Goal: Ability to tolerate increased activity will improve Outcome: Progressing   Problem: Respiratory: Goal: Ability to maintain a clear airway and adequate ventilation will improve Outcome: Progressing   Problem: Role Relationship: Goal: Method of communication will improve Outcome: Progressing   

## 2021-02-12 LAB — CBC
HCT: 34.7 % — ABNORMAL LOW (ref 39.0–52.0)
Hemoglobin: 10.7 g/dL — ABNORMAL LOW (ref 13.0–17.0)
MCH: 25.1 pg — ABNORMAL LOW (ref 26.0–34.0)
MCHC: 30.8 g/dL (ref 30.0–36.0)
MCV: 81.5 fL (ref 80.0–100.0)
Platelets: 293 10*3/uL (ref 150–400)
RBC: 4.26 MIL/uL (ref 4.22–5.81)
RDW: 15.9 % — ABNORMAL HIGH (ref 11.5–15.5)
WBC: 14 10*3/uL — ABNORMAL HIGH (ref 4.0–10.5)
nRBC: 0 % (ref 0.0–0.2)

## 2021-02-12 LAB — HEPATIC FUNCTION PANEL
ALT: 235 U/L — ABNORMAL HIGH (ref 0–44)
AST: 54 U/L — ABNORMAL HIGH (ref 15–41)
Albumin: 2.8 g/dL — ABNORMAL LOW (ref 3.5–5.0)
Alkaline Phosphatase: 95 U/L (ref 38–126)
Bilirubin, Direct: <0.1 mg/dL (ref 0.0–0.2)
Total Bilirubin: 0.4 mg/dL (ref 0.3–1.2)
Total Protein: 7.8 g/dL (ref 6.5–8.1)

## 2021-02-12 LAB — BASIC METABOLIC PANEL
Anion gap: 7 (ref 5–15)
BUN: 52 mg/dL — ABNORMAL HIGH (ref 8–23)
CO2: 26 mmol/L (ref 22–32)
Calcium: 8.9 mg/dL (ref 8.9–10.3)
Chloride: 106 mmol/L (ref 98–111)
Creatinine, Ser: 1.26 mg/dL — ABNORMAL HIGH (ref 0.61–1.24)
GFR, Estimated: >60 mL/min (ref >60)
Glucose, Bld: 184 mg/dL — ABNORMAL HIGH (ref 70–99)
Potassium: 4 mmol/L (ref 3.5–5.1)
Sodium: 139 mmol/L (ref 135–145)

## 2021-02-12 LAB — GLUCOSE, CAPILLARY
Glucose-Capillary: 164 mg/dL — ABNORMAL HIGH (ref 70–99)
Glucose-Capillary: 177 mg/dL — ABNORMAL HIGH (ref 70–99)
Glucose-Capillary: 181 mg/dL — ABNORMAL HIGH (ref 70–99)
Glucose-Capillary: 192 mg/dL — ABNORMAL HIGH (ref 70–99)
Glucose-Capillary: 203 mg/dL — ABNORMAL HIGH (ref 70–99)
Glucose-Capillary: 211 mg/dL — ABNORMAL HIGH (ref 70–99)
Glucose-Capillary: 230 mg/dL — ABNORMAL HIGH (ref 70–99)

## 2021-02-12 LAB — CULTURE, BLOOD (ROUTINE X 2)
Culture: NO GROWTH
Culture: NO GROWTH
Special Requests: ADEQUATE
Special Requests: ADEQUATE

## 2021-02-12 LAB — PHOSPHORUS: Phosphorus: 3 mg/dL (ref 2.5–4.6)

## 2021-02-12 LAB — URINALYSIS, COMPLETE (UACMP) WITH MICROSCOPIC
Bacteria, UA: NONE SEEN
Bilirubin Urine: NEGATIVE
Glucose, UA: NEGATIVE mg/dL
Hgb urine dipstick: NEGATIVE
Ketones, ur: NEGATIVE mg/dL
Leukocytes,Ua: NEGATIVE
Nitrite: NEGATIVE
Protein, ur: NEGATIVE mg/dL
Specific Gravity, Urine: 1.018 (ref 1.005–1.030)
pH: 5 (ref 5.0–8.0)

## 2021-02-12 LAB — VITAMIN D 25 HYDROXY (VIT D DEFICIENCY, FRACTURES): Vit D, 25-Hydroxy: 25.76 ng/mL — ABNORMAL LOW (ref 30–100)

## 2021-02-12 LAB — MAGNESIUM: Magnesium: 2.4 mg/dL (ref 1.7–2.4)

## 2021-02-12 NOTE — Progress Notes (Signed)
[facility] Progress Note  Patient: Jeremy Browning    OIZ:[id number]  DOA: 12/25/2020     Date of Service: the patient was seen and examined on 02/12/2021  Chief Complaint  Patient presents with   Cardiac Arrest   Brief hospital course: Medical records reviewed and are as summarized below:   Dejohn Ibarra is an 60 y.o. male with obesity, DM2, HTN, combined CHF who was admitted on 12/25/2020 with respiratory distress.  He was intubated in the ER also had VT requiring epi/cardioversion.  Hospital course complicated by persistent vent dependent respiratory failure eventually requiring tracheostomy, anoxic encephalopathy.Placement issue.  Most recent issue was pain in right knee.  Currently further plan is continue current treatment until placement.  Assessment and Plan: Active Problems:   Cardiogenic shock (HCC)   Cardiac arrest with successful resuscitation ([place])   Flash pulmonary edema ([place])   Cardiopulmonary arrest with successful resuscitation ([place])   Acute respiratory failure with hypoxia and hypercapnia (HCC)   Acute renal failure (HCC)   Transaminitis   Second degree heart block   Lactic acidosis   Aspiration pneumonia (HCC)   HFrEF (heart failure with reduced ejection fraction) (HCC)   Severe hypoxic-ischemic encephalopathy   SOB (shortness of breath)   Oropharyngeal dysphagia     Acute hypoxic respiratory failure in the setting of cardiac arrest, pulmonary edema, Proteus pneumonia  -treated while in the ICU, completed a course of antibiotics with ceftriaxone and Unasyn.  Has not required them for the past week, tolerating trach collar -Continue aggressive pulmonary toilet, continue bronchodilators -Respiratory status is stable 6/23 d/w ENT rec decanulation and contacted RT and  RT decannulated pt to RA. Tolerated well. Gauze placed over stoma Sating 100% on RA. Bilateral rhonchi breath sounds.   Cardiac arrest / V. Tach / non-STEMI / acute combined systolic and  diastolic CHF /cardiogenic shock --requiring pressors while in the ICU, currently resolved. -- Cardiology consulted and underwent a cardiac cath on 5/7 which showed normal coronaries. -- A 2D echo done on 5/9 showed an EF of 45-50% with global hypokinesis of the left ventricle.  It also showed grade 1 diastolic dysfunction.  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 Recommends  steroids for now.-- start to wean down as the IV steroids appeared to cause some psychosis/behavioral issues Left knee ulcer, mild puss, continue dressing and apply bacitracin ointment twice daily.      Elevated LFTs -noticed on 6/11, overall trending down.   --Hepatitis panel negative, right upper quadrant ultrasound negative.  --Discontinued Tylenol and statin Follow repeat LFTs   Anoxic brain encephalopathy, CVAs-high suspicion based on the MRI, clinically stable currently.   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  Neurology consulted and evaluated patient while hospitalized.  His left side is flaccid.  Mental status seems to be improving, he is more interactive, and has been talking more for the last couple of days   Acute kidney injury -baseline creatinine seems to be around 1.1-1.2   Anemia of chronic disease -hemoglobin stable, no bleeding   Nutrition--cont PEG feeding at nighttime as patient's swallowing is improving --6/15-- patient did not tolerate swallow study/evaluation by speech therapy Continue oral diet along with nocturnal PEG tube feeding to meet calorie requirement.  Obesity, Body mass index is 31.57 kg/m.   Fever (new 6/20) x ray negative for PNA -no further events off abx -continue to monitor 6/22 afebrile, WBC 16.7 still  elevated Blood culture no growth till date,  6/25 follow repeat UA and urine culture  Body mass index is 33.31 kg/m.  Nutrition Problem: Inadequate oral intake Etiology: inability to eat (pt sedated and ventilated) Interventions:    Pressure Injury Buttocks Left (Active)     Location: Buttocks  Location Orientation: Left  Staging:   Wound Description (Comments):   Present on Admission:      Pressure Injury 02/09/21 Buttocks Medial Stage 2 -  Partial thickness loss of dermis presenting as a shallow open injury with a red, pink wound bed without slough. (Active)  02/09/21 1209  Location: Buttocks  Location Orientation: Medial  Staging: Stage 2 -  Partial thickness loss of dermis presenting as a shallow open injury with a red, pink wound bed without slough.  Wound Description (Comments):   Present on Admission:      Pressure Injury 02/09/21 Buttocks Left Stage 2 -  Partial thickness loss of dermis presenting as a shallow open injury with a red, pink wound bed without slough. (Active)  02/09/21 1213  Location: Buttocks  Location Orientation: Left  Staging: Stage 2 -  Partial thickness loss of dermis presenting as a shallow open injury with a red, pink wound bed without slough.  Wound Description (Comments):   Present on Admission:      Diet: peg tube feeding DVT Prophylaxis: Subcutaneous Lovenox   Advance goals of care discussion: DNR  Family Communication: family was not present at bedside, at the time of interview.  The pt provided permission to discuss medical plan with the family. Opportunity was given to ask question and all questions were answered satisfactorily.   Disposition:  Pt is from home, admitted with cardiogenic shock, anoxic brain injury, CVA, s/p trach and PEG tube, which precludes a safe discharge. Discharge to [facility] versus SNF, when bed available.  Difficult to place  Subjective: No active overnight issues, patient has some cognitive issues,  Denied any  other active issues, no chest palpitation, no abdominal pain.     Physical Exam: General:  alert, NAD, lying in bed comfortably.  Appear in no  distress, affect appropriate Eyes: PERRLA ENT: Oral Mucosa Clear, moist  Neck: no JVD, s/p tracheostomy, decanulated, covered with guaze   Cardiovascular: S1 and S2 Present, no Murmur,  Respiratory: good respiratory effort, Bilateral Air entry equal and Decreased, no Crackles, no wheezes Abdomen: Bowel Sound present, Soft, obese and no tenderness, s/p PEG tube Skin: Pressure ulcers, left knee ulcer Extremities: mild Pedal edema, no calf tenderness Neurologic: awake and alert, residual left-sided weakness due to CVA/anoxic brain injury, power 3-4/5 gradually improving Gait not checked due to patient safety concerns  Vitals:   02/11/21 2012 02/12/21 0338 02/12/21 0800 02/12/21 1131  BP: (!) 150/50 (!) 142/56 (!) 122/48 (!) 144/64  Pulse: (!) 59 (!) 58 60 60  Resp: '19 15 16 18  ' Temp: 97.8 F (36.6 C) 98.5 F (36.9 C)  98.5 F (36.9 C)  TempSrc: Oral   Oral  SpO2: 100% 100% 100% 100%  Weight:  111.4 kg    Height:        Intake/Output Summary (Last 24 hours) at 02/12/2021 1313 Last data filed at 02/12/2021 1002 Gross per 24 hour  Intake 720 ml  Output 400 ml  Net 320 ml   [employer]  02/10/21 0407 02/11/21 0409 02/12/21 0338  Weight: 111.3 kg 110.3 kg 111.4 kg    Data Reviewed: I have personally reviewed and interpreted daily labs, tele strips, imagings as discussed above. I reviewed all nursing notes, pharmacy notes, vitals, pertinent old records I have discussed plan of care as described above with RN and patient/family.  CBC: Recent Labs  Lab 02/06/21 0556 02/08/21 0442 02/09/21 0430 02/10/21 0452 02/12/21 0414  WBC 12.9* 16.0* 16.7* 12.7* 14.0*  NEUTROABS  --   --  9.3*  --   --   HGB 9.6* 10.7* 10.4* 10.3* 10.7*  HCT 30.5* 34.2* 33.1* 34.3* 34.7*  MCV 79.6* 78.1* 78.3* 80.9 81.5  PLT 328 349 255 301 889    Basic Metabolic Panel: Recent Labs  Lab 02/06/21 0556 02/08/21 0442 02/10/21 0452 02/12/21 0414  NA 144 141 144 139  K 4.0 4.1 4.1 4.0  CL 113* 109 112* 106  CO2 '25 23 25 26  ' GLUCOSE 157* 179* 171* 184*  BUN 61* 58* 55* 52*  CREATININE 1.26* 1.26* 1.37* 1.26*  CALCIUM 8.9 8.7* 9.0 8.9  MG  --   --  2.5* 2.4  PHOS  --   --  3.9 3.0    Studies: No results found.  Scheduled Meds:  aspirin  81 mg Per Tube Daily   carvedilol  25 mg Per Tube BID WC   chlorhexidine gluconate (MEDLINE KIT)  15 mL Mouth Rinse BID   Chlorhexidine Gluconate Cloth  6 each Topical Q0600   diclofenac Sodium  2 g Topical QID   docusate  100 mg Per Tube BID   enoxaparin (LOVENOX) injection  0.5 mg/kg Subcutaneous Q24H   famotidine  20 mg Per Tube Daily   feeding supplement (NEPRO CARB STEADY)  1,600 mL Per Tube Q24H   free water  200 mL Per Tube Q6H   hydrALAZINE  50 mg Per Tube TID   insulin aspart  0-15 Units Subcutaneous TID AC & HS   mouth rinse  15 mL Mouth Rinse 10 times per day   neomycin-bacitracin-polymyxin   Topical BID   polyethylene glycol  17 g Per Tube Daily   predniSONE  40 mg Per Tube Q breakfast   Continuous Infusions:   PRN Meds: acetaminophen (TYLENOL) oral liquid 160 mg/5 mL, albuterol, docusate, hydrALAZINE, lip balm, oxyCODONE, polyethylene glycol, polyvinyl alcohol, sodium chloride flush, sodium chloride flush  Time spent: 35 minutes  Author: Val Riles. MD [facility] 02/12/2021 1:13 PM  To reach On-call, see care teams to locate the attending and reach out to them via [email or web address]. If 7PM-7AM, please contact night-coverage If you still have difficulty reaching the attending provider, please page the [facility] (Director on Call) for [facility] on amion for assistance.

## 2021-02-12 NOTE — Plan of Care (Signed)
  Problem: Clinical Measurements: Goal: Ability to maintain clinical measurements within normal limits will improve 02/12/2021 1303 by Ansel Bong, RN Outcome: Progressing 02/12/2021 1303 by Ansel Bong, RN Outcome: Progressing Goal: Will remain free from infection 02/12/2021 1303 by Ansel Bong, RN Outcome: Progressing 02/12/2021 1303 by Ansel Bong, RN Outcome: Progressing Goal: Diagnostic test results will improve 02/12/2021 1303 by Ansel Bong, RN Outcome: Progressing 02/12/2021 1303 by Ansel Bong, RN Outcome: Progressing Goal: Respiratory complications will improve 02/12/2021 1303 by Ansel Bong, RN Outcome: Progressing 02/12/2021 1303 by Ansel Bong, RN Outcome: Progressing Goal: Cardiovascular complication will be avoided 02/12/2021 1303 by Ansel Bong, RN Outcome: Progressing 02/12/2021 1303 by Ansel Bong, RN Outcome: Progressing   Problem: Nutrition: Goal: Adequate nutrition will be maintained 02/12/2021 1303 by Ansel Bong, RN Outcome: Progressing 02/12/2021 1303 by Ansel Bong, RN Outcome: Progressing   Problem: Elimination: Goal: Will not experience complications related to bowel motility 02/12/2021 1303 by Ansel Bong, RN Outcome: Progressing 02/12/2021 1303 by Ansel Bong, RN Outcome: Progressing Goal: Will not experience complications related to urinary retention 02/12/2021 1303 by Ansel Bong, RN Outcome: Progressing 02/12/2021 1303 by Ansel Bong, RN Outcome: Progressing   Problem: Pain Managment: Goal: General experience of comfort will improve 02/12/2021 1303 by Ansel Bong, RN Outcome: Progressing 02/12/2021 1303 by Ansel Bong, RN Outcome: Progressing   Problem: Safety: Goal: Ability to remain free from injury will improve 02/12/2021 1303 by Ansel Bong, RN Outcome: Progressing 02/12/2021 1303 by Ansel Bong, RN Outcome: Progressing   Problem: Skin Integrity: Goal: Risk for impaired skin  integrity will decrease 02/12/2021 1303 by Ansel Bong, RN Outcome: Progressing 02/12/2021 1303 by Ansel Bong, RN Outcome: Progressing   Problem: Activity: Goal: Ability to tolerate increased activity will improve 02/12/2021 1303 by Ansel Bong, RN Outcome: Progressing 02/12/2021 1303 by Ansel Bong, RN Outcome: Progressing   Problem: Respiratory: Goal: Ability to maintain a clear airway and adequate ventilation will improve 02/12/2021 1303 by Ansel Bong, RN Outcome: Progressing 02/12/2021 1303 by Ansel Bong, RN Outcome: Progressing   Problem: Role Relationship: Goal: Method of communication will improve 02/12/2021 1303 by Ansel Bong, RN Outcome: Progressing 02/12/2021 1303 by Ansel Bong, RN Outcome: Progressing

## 2021-02-13 LAB — GLUCOSE, CAPILLARY
Glucose-Capillary: 143 mg/dL — ABNORMAL HIGH (ref 70–99)
Glucose-Capillary: 181 mg/dL — ABNORMAL HIGH (ref 70–99)
Glucose-Capillary: 249 mg/dL — ABNORMAL HIGH (ref 70–99)
Glucose-Capillary: 187 mg/dL — ABNORMAL HIGH (ref 70–99)
Glucose-Capillary: 204 mg/dL — ABNORMAL HIGH (ref 70–99)
Glucose-Capillary: 204 mg/dL — ABNORMAL HIGH (ref 70–99)

## 2021-02-13 MED ORDER — HYDRALAZINE HCL 25 MG PO TABS
25.0000 mg | ORAL_TABLET | Freq: Once | ORAL | Status: AC
  Administered 2021-02-13: 25 mg via ORAL

## 2021-02-13 NOTE — Progress Notes (Signed)
[facility] Progress Note  Patient: Jeremy Browning    IEP:[id number]  DOA: 12/25/2020     Date of Service: the patient was seen and examined on 02/13/2021  Chief Complaint  Patient presents with   Cardiac Arrest   Brief hospital course: Medical records reviewed and are as summarized below:   Jeremy Browning is an 60 y.o. male with obesity, DM2, HTN, combined CHF who was admitted on 12/25/2020 with respiratory distress.  He was intubated in the ER also had VT requiring epi/cardioversion.  Hospital course complicated by persistent vent dependent respiratory failure eventually requiring tracheostomy, anoxic encephalopathy.Placement issue.  Most recent issue was pain in right knee.  Currently further plan is continue current treatment until placement.  Assessment and Plan: Active Problems:   Cardiogenic shock (HCC)   Cardiac arrest with successful resuscitation ([place])   Flash pulmonary edema ([place])   Cardiopulmonary arrest with successful resuscitation ([place])   Acute respiratory failure with hypoxia and hypercapnia (HCC)   Acute renal failure (HCC)   Transaminitis   Second degree heart block   Lactic acidosis   Aspiration pneumonia (HCC)   HFrEF (heart failure with reduced ejection fraction) (HCC)   Severe hypoxic-ischemic encephalopathy   SOB (shortness of breath)   Oropharyngeal dysphagia     Acute hypoxic respiratory failure in the setting of cardiac arrest, pulmonary edema, Proteus pneumonia  -treated while in the ICU, completed a course of antibiotics with ceftriaxone and Unasyn.  Has not required them for the past week, tolerating trach collar -Continue aggressive pulmonary toilet, continue bronchodilators -Respiratory status is stable 6/23 d/w ENT rec decanulation and contacted RT and  RT decannulated pt to RA. Tolerated well. Gauze placed over stoma Sating 100% on RA. Bilateral rhonchi breath sounds.   Cardiac arrest / V. Tach / non-STEMI / acute combined systolic and  diastolic CHF /cardiogenic shock --requiring pressors while in the ICU, currently resolved. -- Cardiology consulted and underwent a cardiac cath on 5/7 which showed normal coronaries. -- A 2D echo done on 5/9 showed an EF of 45-50% with global hypokinesis of the left ventricle.  It also showed grade 1 diastolic dysfunction.  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 Recommends  steroids for now.-- start to wean down as the IV steroids appeared to cause some psychosis/behavioral issues Left knee ulcer, mild puss, continue dressing and apply bacitracin ointment twice daily.      Elevated LFTs -noticed on 6/11, overall trending down.   --Hepatitis panel negative, right upper quadrant ultrasound negative.  --Discontinued Tylenol and statin Follow repeat LFTs   Anoxic brain encephalopathy, CVAs-high suspicion based on the MRI, clinically stable currently.   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  Neurology consulted and evaluated patient while hospitalized.  His left side is flaccid.  Mental status seems to be improving, he is more interactive, and has been talking more for the last couple of days   Acute kidney injury -baseline creatinine seems to be around 1.1-1.2   Anemia of chronic disease -hemoglobin stable, no bleeding   Nutrition--cont PEG feeding at nighttime as patient's swallowing is improving --6/15-- patient did not tolerate swallow study/evaluation by speech therapy Continue oral diet along with nocturnal PEG tube feeding to meet calorie requirement.  Obesity, Body mass index is 31.57 kg/m.   Fever (new 6/20) x ray negative for PNA -no further events off abx -continue to monitor 6/22 afebrile, WBC 16.7 still  elevated Blood culture no growth till date,  6/25 follow repeat UA and urine culture  Body mass index is 34.38 kg/m.  Nutrition Problem: Inadequate oral intake Etiology: inability to eat (pt sedated and ventilated) Interventions:    Pressure Injury Buttocks Left (Active)     Location: Buttocks  Location Orientation: Left  Staging:   Wound Description (Comments):   Present on Admission:      Pressure Injury 02/09/21 Buttocks Medial Stage 2 -  Partial thickness loss of dermis presenting as a shallow open injury with a red, pink wound bed without slough. (Active)  02/09/21 1209  Location: Buttocks  Location Orientation: Medial  Staging: Stage 2 -  Partial thickness loss of dermis presenting as a shallow open injury with a red, pink wound bed without slough.  Wound Description (Comments):   Present on Admission:      Pressure Injury 02/09/21 Buttocks Left Stage 2 -  Partial thickness loss of dermis presenting as a shallow open injury with a red, pink wound bed without slough. (Active)  02/09/21 1213  Location: Buttocks  Location Orientation: Left  Staging: Stage 2 -  Partial thickness loss of dermis presenting as a shallow open injury with a red, pink wound bed without slough.  Wound Description (Comments):   Present on Admission:      Diet: peg tube feeding DVT Prophylaxis: Subcutaneous Lovenox   Advance goals of care discussion: DNR  Family Communication: family was not present at bedside, at the time of interview.  The pt provided permission to discuss medical plan with the family. Opportunity was given to ask question and all questions were answered satisfactorily.   Disposition:  Pt is from home, admitted with cardiogenic shock, anoxic brain injury, CVA, s/p trach and PEG tube, which precludes a safe discharge. Discharge to [facility] versus SNF, when bed available.  Difficult to place  Subjective: No active overnight issues, patient has some cognitive issues,  Denied any  other active issues, no chest palpitation, no abdominal pain.     Physical Exam: General:  alert, NAD, lying in bed comfortably.  Appear in no  distress, affect appropriate Eyes: PERRLA ENT: Oral Mucosa Clear, moist  Neck: no JVD, s/p tracheostomy, decanulated, covered with guaze   Cardiovascular: S1 and S2 Present, no Murmur,  Respiratory: good respiratory effort, Bilateral Air entry equal and Decreased, no Crackles, no wheezes Abdomen: Bowel Sound present, Soft, obese and no tenderness, s/p PEG tube Skin: Pressure ulcers, left knee ulcer Extremities: mild Pedal edema, no calf tenderness Neurologic: awake and alert, residual left-sided weakness due to CVA/anoxic brain injury, power 3-4/5 gradually improving Gait not checked due to patient safety concerns  Vitals:   02/13/21 0404 02/13/21 0500 02/13/21 0817 02/13/21 1100  BP: (!) 136/55  (!) 122/44 (!) 128/52  Pulse: (!) 58  64 62  Resp: _0 Temp: 98.3 F (36.8 C)  98 F (36.7 C) 98 F (36.7 C)  TempSrc:      SpO2: 99%  100% 98%  Weight:  115 kg    Height:        Intake/Output Summary (Last 24 hours) at 02/13/2021 1229 Last data filed at 02/13/2021 0800 Gross per 24 hour  Intake 940 ml  Output 1225 ml  Net -285 ml   [employer]  02/11/21 0409 02/12/21 0338 02/13/21 0500  Weight: 110.3 kg 111.4 kg 115 kg    Data Reviewed: I have personally reviewed and interpreted daily labs, tele strips, imagings as discussed above. I reviewed all nursing notes, pharmacy notes, vitals, pertinent old records I have discussed plan of care as described above with RN and patient/family.  CBC: Recent Labs  Lab 02/08/21 0442 02/09/21 0430 02/10/21 0452 02/12/21 0414  WBC 16.0* 16.7* 12.7* 14.0*  NEUTROABS  --  9.3*  --   --   HGB 10.7* 10.4* 10.3* 10.7*  HCT 34.2* 33.1* 34.3* 34.7*  MCV 78.1* 78.3* 80.9 81.5  PLT 349 255 301 096   Basic Metabolic Panel: Recent Labs  Lab 02/08/21 0442 02/10/21 0452 02/12/21 0414   NA 141 144 139  K 4.1 4.1 4.0  CL 109 112* 106  CO2 _0 GLUCOSE 179* 171* 184*  BUN 58* 55* 52*  CREATININE 1.26* 1.37* 1.26*  CALCIUM 8.7* 9.0 8.9  MG  --  2.5* 2.4  PHOS  --  3.9 3.0    Studies: No results found.  Scheduled Meds:  aspirin  81 mg Per Tube Daily   carvedilol  25 mg Per Tube BID WC   chlorhexidine gluconate (MEDLINE KIT)  15 mL Mouth Rinse BID   Chlorhexidine Gluconate Cloth  6 each Topical Q0600   diclofenac Sodium  2 g Topical QID   docusate  100 mg Per Tube BID   enoxaparin (LOVENOX) injection  0.5 mg/kg Subcutaneous Q24H   famotidine  20 mg Per Tube Daily   feeding supplement (NEPRO CARB STEADY)  1,600 mL Per Tube Q24H   free water  200 mL Per Tube Q6H   hydrALAZINE  50 mg Per Tube TID   insulin aspart  0-15 Units Subcutaneous TID AC & HS   mouth rinse  15 mL Mouth Rinse 10 times per day   neomycin-bacitracin-polymyxin   Topical BID   polyethylene glycol  17 g Per Tube Daily   predniSONE  40 mg Per Tube Q breakfast   Continuous Infusions:   PRN Meds: acetaminophen (TYLENOL) oral liquid 160 mg/5 mL, albuterol, docusate, hydrALAZINE, lip balm, oxyCODONE, polyethylene glycol, polyvinyl alcohol, sodium chloride flush, sodium chloride flush  Time spent: 35 minutes  Author: Val Riles. MD [facility] 02/13/2021 12:29 PM  To reach On-call, see care teams to locate the attending and reach out to them via [email or web address]. If 7PM-7AM, please contact night-coverage If you still have difficulty reaching the attending provider, please page the [facility] (Director on Call) for [facility] on amion for assistance.

## 2021-02-13 NOTE — Progress Notes (Signed)
Pt is A&O, VS stable, NSR on the monitor, IV in place, PEG in place, tube feeds at 100/hr and free water flush. Foley in place. I placed him on 1L [place] for comfort, no complaints of pain tonight.

## 2021-02-13 NOTE — Plan of Care (Signed)
  Problem: Clinical Measurements: Goal: Ability to maintain clinical measurements within normal limits will improve 02/13/2021 1249 by Ansel Bong, RN Outcome: Progressing 02/13/2021 1249 by Ansel Bong, RN Outcome: Progressing Goal: Will remain free from infection 02/13/2021 1249 by Ansel Bong, RN Outcome: Progressing 02/13/2021 1249 by Ansel Bong, RN Outcome: Progressing Goal: Diagnostic test results will improve 02/13/2021 1249 by Ansel Bong, RN Outcome: Progressing 02/13/2021 1249 by Ansel Bong, RN Outcome: Progressing Goal: Respiratory complications will improve 02/13/2021 1249 by Ansel Bong, RN Outcome: Progressing 02/13/2021 1249 by Ansel Bong, RN Outcome: Progressing Goal: Cardiovascular complication will be avoided 02/13/2021 1249 by Ansel Bong, RN Outcome: Progressing 02/13/2021 1249 by Ansel Bong, RN Outcome: Progressing   Problem: Nutrition: Goal: Adequate nutrition will be maintained 02/13/2021 1249 by Ansel Bong, RN Outcome: Progressing 02/13/2021 1249 by Ansel Bong, RN Outcome: Progressing   Problem: Elimination: Goal: Will not experience complications related to bowel motility 02/13/2021 1249 by Ansel Bong, RN Outcome: Progressing 02/13/2021 1249 by Ansel Bong, RN Outcome: Progressing Goal: Will not experience complications related to urinary retention 02/13/2021 1249 by Ansel Bong, RN Outcome: Progressing 02/13/2021 1249 by Ansel Bong, RN Outcome: Progressing   Problem: Pain Managment: Goal: General experience of comfort will improve 02/13/2021 1249 by Ansel Bong, RN Outcome: Progressing 02/13/2021 1249 by Ansel Bong, RN Outcome: Progressing   Problem: Safety: Goal: Ability to remain free from injury will improve 02/13/2021 1249 by Ansel Bong, RN Outcome: Progressing 02/13/2021 1249 by Ansel Bong, RN Outcome: Progressing   Problem: Skin Integrity: Goal: Risk for impaired skin  integrity will decrease 02/13/2021 1249 by Ansel Bong, RN Outcome: Progressing 02/13/2021 1249 by Ansel Bong, RN Outcome: Progressing   Problem: Activity: Goal: Ability to tolerate increased activity will improve 02/13/2021 1249 by Ansel Bong, RN Outcome: Progressing 02/13/2021 1249 by Ansel Bong, RN Outcome: Progressing   Problem: Respiratory: Goal: Ability to maintain a clear airway and adequate ventilation will improve 02/13/2021 1249 by Ansel Bong, RN Outcome: Progressing 02/13/2021 1249 by Ansel Bong, RN Outcome: Progressing   Problem: Role Relationship: Goal: Method of communication will improve 02/13/2021 1249 by Ansel Bong, RN Outcome: Progressing 02/13/2021 1249 by Ansel Bong, RN Outcome: Progressing

## 2021-02-13 NOTE — TOC Progression Note (Signed)
Transition of Care [facility]) - Progression Note    Patient Details  Name: Jeremy [place] MRN: [id number] Date of Birth: 1961-08-21  Transition of Care [facility]) CM/SW Contact  Chapman Fitch, RN Phone Number: 02/13/2021, 1:11 PM  Clinical Narrative:    No bed offers    Expected Discharge Plan: Skilled Nursing Facility Barriers to Discharge: Continued Medical Work up  Expected Discharge Plan and Services Expected Discharge Plan: Skilled Nursing Facility In-house Referral: Clinical Social Work   Post Acute Care Choice: Skilled Nursing Facility                                         Social Determinants of Health (SDOH) Interventions    Readmission Risk Interventions No flowsheet data found.

## 2021-02-14 DIAGNOSIS — J690 Pneumonitis due to inhalation of food and vomit: Secondary | ICD-10-CM

## 2021-02-14 LAB — BASIC METABOLIC PANEL
Anion gap: 5 (ref 5–15)
BUN: 46 mg/dL — ABNORMAL HIGH (ref 8–23)
CO2: 25 mmol/L (ref 22–32)
Calcium: 8.6 mg/dL — ABNORMAL LOW (ref 8.9–10.3)
Chloride: 106 mmol/L (ref 98–111)
Creatinine, Ser: 1.13 mg/dL (ref 0.61–1.24)
GFR, Estimated: >60 mL/min (ref >60)
Glucose, Bld: 166 mg/dL — ABNORMAL HIGH (ref 70–99)
Potassium: 3.9 mmol/L (ref 3.5–5.1)
Sodium: 136 mmol/L (ref 135–145)

## 2021-02-14 LAB — GLUCOSE, CAPILLARY
Glucose-Capillary: 166 mg/dL — ABNORMAL HIGH (ref 70–99)
Glucose-Capillary: 138 mg/dL — ABNORMAL HIGH (ref 70–99)
Glucose-Capillary: 196 mg/dL — ABNORMAL HIGH (ref 70–99)
Glucose-Capillary: 172 mg/dL — ABNORMAL HIGH (ref 70–99)

## 2021-02-14 LAB — HEPATIC FUNCTION PANEL
ALT: 227 U/L — ABNORMAL HIGH (ref 0–44)
AST: 85 U/L — ABNORMAL HIGH (ref 15–41)
Albumin: 2.8 g/dL — ABNORMAL LOW (ref 3.5–5.0)
Alkaline Phosphatase: 88 U/L (ref 38–126)
Bilirubin, Direct: <0.1 mg/dL (ref 0.0–0.2)
Total Bilirubin: 0.4 mg/dL (ref 0.3–1.2)
Total Protein: 7.6 g/dL (ref 6.5–8.1)

## 2021-02-14 LAB — CBC
HCT: 34.8 % — ABNORMAL LOW (ref 39.0–52.0)
Hemoglobin: 10.8 g/dL — ABNORMAL LOW (ref 13.0–17.0)
MCH: 25.1 pg — ABNORMAL LOW (ref 26.0–34.0)
MCHC: 31 g/dL (ref 30.0–36.0)
MCV: 80.9 fL (ref 80.0–100.0)
Platelets: 240 10*3/uL (ref 150–400)
RBC: 4.3 MIL/uL (ref 4.22–5.81)
RDW: 16.2 % — ABNORMAL HIGH (ref 11.5–15.5)
WBC: 14.3 10*3/uL — ABNORMAL HIGH (ref 4.0–10.5)
nRBC: 0 % (ref 0.0–0.2)

## 2021-02-14 LAB — PHOSPHORUS: Phosphorus: 3 mg/dL (ref 2.5–4.6)

## 2021-02-14 LAB — MAGNESIUM: Magnesium: 2.2 mg/dL (ref 1.7–2.4)

## 2021-02-14 NOTE — Progress Notes (Signed)
Pt is A&O, VS stable, NSR/brady on the monitor, IV in place, PEG in place, tube feeds at 100/hr and free water flush. External foley in place. no complaints of pain tonight. slept most of the night

## 2021-02-14 NOTE — Progress Notes (Signed)
PROGRESS NOTE    Jeremy [place]  IOE:[id number] DOB: 04-Jun-1961 DOA: 12/25/2020 PCP: No primary care provider on file.    Brief Narrative:  Jeremy [place] is an 60 y.o. male with obesity, DM2, HTN, combined CHF who was admitted on 12/25/2020 with respiratory distress.  He was intubated in the ER also had VT requiring epi/cardioversion.  Hospital course complicated by persistent vent dependent respiratory failure eventually requiring tracheostomy, anoxic encephalopathy. Patient had coronary angiogram performed 5/7, reviewed and normal coronary arteries.  Echocardiogram showed ejection fraction 45 to 50% with grade 1 diastolic dysfunction.  Patient since has improved.  Trach/PEG tubes are removed.  Patient is able to start eating.  Currently he is pending SNF placement.   Assessment & Plan:   Active Problems:   Cardiogenic shock (HCC)   Cardiac arrest with successful resuscitation ([place])   Flash pulmonary edema ([place])   Cardiopulmonary arrest with successful resuscitation ([place])   Acute respiratory failure with hypoxia and hypercapnia (HCC)   Acute renal failure (HCC)   Transaminitis   Second degree heart block   Lactic acidosis   Aspiration pneumonia (HCC)   HFrEF (heart failure with reduced ejection fraction) (HCC)   Severe hypoxic-ischemic encephalopathy   SOB (shortness of breath)   Oropharyngeal dysphagia   Pressure injury of skin  #1.  Acute hypoxemic respiratory failure secondary to cardiac arrest. Status post cardiac arrest. Acute on chronic diastolic congestive heart failure. Proteus pneumonia. Patient condition had improved.  Patient has been evaluated by speech therapist, currently on dysphagia 1 diet.  #2.  Elevated liver function enzymes. Secondary to cardiac arrest. Condition improved.  3.  Right knee severe DJD. Symptomatic treatment.  4.  Anoxic brain injury. History of CVA. Clinically stable.  5.  Acute kidney injury P Renal function improved.  #6.  Type 2  diabetes. Continue sliding scale insulin.    DVT prophylaxis: Lovenox Code Status: DNR Family Communication:  Disposition Plan:    Status is: Inpatient  Remains inpatient appropriate because:Unsafe d/c plan  Dispo: The patient is from: Home              Anticipated d/c is to: SNF              Patient currently is medically stable to d/c.   Difficult to place patient Yes        I/O last 3 completed shifts: In: 1180 [P.O.:360; Other:820] Out: 1500 [Urine:1500] Total I/O In: -  Out: [place] [Urine:675]     Consultants:  Pulm, card  Procedures: Heart cath  Antimicrobials: None   Subjective: Patient condition has improved, currently he has some knee pain.  Otherwise doing well.  He has some confusion, but no agitation. Denies any short of breath or cough. No abdominal pain or nausea vomiting to No dysuria hematuria. No fever chills.  Objective: Vitals:   02/14/21 0402 02/14/21 0600 02/14/21 0748 02/14/21 1146  BP: (!) 125/53  137/67 (!) 120/57  Pulse: (!) 55  (!) 56 (!) 57  Resp: 18     Temp: 98.3 F (36.8 C)  98.5 F (36.9 C) 98.2 F (36.8 C)  TempSrc:   Oral Oral  SpO2: 100%  100% 100%  Weight:  112.6 kg    Height:        Intake/Output Summary (Last 24 hours) at 02/14/2021 1359 Last data filed at 02/14/2021 1300 Gross per 24 hour  Intake --  Output 1375 ml  Net -1375 ml   Filed Weights   02/12/21 1829  02/13/21 0500 02/14/21 0600  Weight: 111.4 kg 115 kg 112.6 kg    Examination:  General exam: Appears calm and comfortable  Respiratory system: Clear to auscultation. Respiratory effort normal. Cardiovascular system: S1 & S2 heard, RRR. No JVD, murmurs, rubs, gallops or clicks. No pedal edema. Gastrointestinal system: Abdomen is nondistended, soft and nontender. No organomegaly or masses felt. Normal bowel sounds heard. Central nervous system: Alert and oriented x2. No focal neurological deficits. Extremities: Symmetric 5 x 5 power. Skin: No  rashes, lesions or ulcers Psychiatry: Judgement and insight appear normal. Mood & affect appropriate.     Data Reviewed: I have personally reviewed following labs and imaging studies  CBC: Recent Labs  Lab 02/08/21 0442 02/09/21 0430 02/10/21 0452 02/12/21 0414 02/14/21 0644  WBC 16.0* 16.7* 12.7* 14.0* 14.3*  NEUTROABS  --  9.3*  --   --   --   HGB 10.7* 10.4* 10.3* 10.7* 10.8*  HCT 34.2* 33.1* 34.3* 34.7* 34.8*  MCV 78.1* 78.3* 80.9 81.5 80.9  PLT 349 255 301 293 366   Basic Metabolic Panel: Recent Labs  Lab 02/08/21 0442 02/10/21 0452 02/12/21 0414 02/14/21 0644  NA 141 144 139 136  K 4.1 4.1 4.0 3.9  CL 109 112* 106 106  CO2 $Re'23 25 26 25  'Vdf$ GLUCOSE 179* 171* 184* 166*  BUN 58* 55* 52* 46*  CREATININE 1.26* 1.37* 1.26* 1.13  CALCIUM 8.7* 9.0 8.9 8.6*  MG  --  2.5* 2.4 2.2  PHOS  --  3.9 3.0 3.0   GFR: Estimated Creatinine Clearance: 88.9 mL/min (by C-G formula based on SCr of 1.13 mg/dL). Liver Function Tests: Recent Labs  Lab 02/12/21 0414 02/14/21 0644  AST 54* 85*  ALT 235* 227*  ALKPHOS 95 88  BILITOT 0.4 0.4  PROT 7.8 7.6  ALBUMIN 2.8* 2.8*   No results for input(s): LIPASE, AMYLASE in the last 168 hours. No results for input(s): AMMONIA in the last 168 hours. Coagulation Profile: No results for input(s): INR, PROTIME in the last 168 hours. Cardiac Enzymes: No results for input(s): CKTOTAL, CKMB, CKMBINDEX, TROPONINI in the last 168 hours. BNP (last 3 results) No results for input(s): PROBNP in the last 8760 hours. HbA1C: No results for input(s): HGBA1C in the last 72 hours. CBG: Recent Labs  Lab 02/13/21 1628 02/13/21 2117 02/13/21 2324 02/14/21 0748 02/14/21 1147  GLUCAP 187* 204* 204* 166* 138*   Lipid Profile: No results for input(s): CHOL, HDL, LDLCALC, TRIG, CHOLHDL, LDLDIRECT in the last 72 hours. Thyroid Function Tests: No results for input(s): TSH, T4TOTAL, FREET4, T3FREE, THYROIDAB in the last 72 hours. Anemia Panel: No  results for input(s): VITAMINB12, FOLATE, FERRITIN, TIBC, IRON, RETICCTPCT in the last 72 hours. Sepsis Labs: No results for input(s): PROCALCITON, LATICACIDVEN in the last 168 hours.  Recent Results (from the past 240 hour(s))  CULTURE, BLOOD (ROUTINE X 2) w Reflex to ID Panel     Status: None   Collection Time: 02/07/21  4:07 PM   Specimen: BLOOD  Result Value Ref Range Status   Specimen Description BLOOD BLOOD LEFT HAND  Final   Special Requests   Final    BOTTLES DRAWN AEROBIC AND ANAEROBIC Blood Culture adequate volume   Culture   Final    NO GROWTH 5 DAYS Performed at [facility], [address] [place], [place] [address]    Report Status 02/12/2021 FINAL  Final  CULTURE, BLOOD (ROUTINE X 2) w Reflex to ID Panel  Status: None   Collection Time: 02/07/21  5:11 PM   Specimen: BLOOD  Result Value Ref Range Status   Specimen Description BLOOD BLOOD LEFT HAND  Final   Special Requests   Final    BOTTLES DRAWN AEROBIC AND ANAEROBIC Blood Culture adequate volume   Culture   Final    NO GROWTH 5 DAYS Performed at [facility], [address] [place], [place] [address]    Report Status 02/12/2021 FINAL  Final  Urine Culture     Status: Abnormal (Preliminary result)   Collection Time: 02/12/21  1:15 PM   Specimen: Urine, Random  Result Value Ref Range Status   Specimen Description   Final    URINE, RANDOM Performed at [facility], [address], [place], [name] [address]    Special Requests   Final    NONE Performed at [facility], [address], [place], [place] [address]    Culture (A)  Final    40,000 COLONIES/mL PROTEUS MIRABILIS SUSCEPTIBILITIES TO FOLLOW Performed at [facility], [place] [address], [place], [place] [address]    Report Status PENDING  Incomplete         Radiology Studies: No results found.      Scheduled Meds:  aspirin  81 mg Per Tube Daily   carvedilol  25 mg Per Tube BID WC    chlorhexidine gluconate (MEDLINE KIT)  15 mL Mouth Rinse BID   Chlorhexidine Gluconate Cloth  6 each Topical Q0600   diclofenac Sodium  2 g Topical QID   docusate  100 mg Per Tube BID   enoxaparin (LOVENOX) injection  0.5 mg/kg Subcutaneous Q24H   famotidine  20 mg Per Tube Daily   feeding supplement (NEPRO CARB STEADY)  1,600 mL Per Tube Q24H   free water  200 mL Per Tube Q6H   hydrALAZINE  50 mg Per Tube TID   insulin aspart  0-15 Units Subcutaneous TID AC & HS   mouth rinse  15 mL Mouth Rinse 10 times per day   neomycin-bacitracin-polymyxin   Topical BID   polyethylene glycol  17 g Per Tube Daily   predniSONE  40 mg Per Tube Q breakfast   Continuous Infusions:   LOS: 51 days    Time spent: 32 minutes    Sharen Hones, MD [facility]   To contact the attending provider between 7A-7P or the covering provider during after hours 7P-7A, please log into the web site [email or web address] and access using universal [place] password for that web site. If you do not have the password, please call the hospital operator.  02/14/2021, 1:59 PM

## 2021-02-14 NOTE — Progress Notes (Signed)
  Speech Language Pathology Treatment: Dysphagia  Patient Details Name: Jeremy [place] MRN: [id number] DOB: [birth year] Today's Date: 02/14/2021 Time: 1235-1300 SLP Time Calculation (min) (ACUTE ONLY): 25 min  Assessment / Plan / Recommendation Clinical Impression  Pt seen for ongoing dysphagia management. Pt awake, alert, sitting upright in bed with lunch tray in front of him. Pt was self-feeding with immediate evidence of oral pocketing when consuming puree. Pt continues to be distractible, so TV was turned off. When prompted, pt consumed thin liquids via straw with no change in vocal quality, WOB or vitals. Pt has been decannulated with gauze taped over stoma. Air escaping around stoma could be heard during speech and pt was not able to place any pressure over gauze. This Clinical research associate reached out to RT covering with request for an adhesive bandage to seal gauze and prevent air leakage to promote stoma healing.      HPI HPI: Pt is a 60 yo male that presented from home with respiratory distress 12/25/2020. Underwent defibrillation and CPR, emergently intubated. During admission pt has had a cardiac cath, peg tube and tracheostomy placed, weaned from vent 01/13/2021. 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 Mildly restricted diffusion involving the cortex more diffusely over the right cerebral convexity favored to reflect an acute MCA territory infarct over global hypoxic injury given unilaterality".  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 Pt currently has a #6 cuffless Shiley with PMSV worn during all waking hours.      SLP Plan  Continue with current plan of care       Recommendations  Diet recommendations: Dysphagia 1 (puree);Thin liquid Liquids provided via: Cup;Straw Medication Administration: Crushed with  puree Supervision: Staff to assist with self feeding;Full supervision/cueing for compensatory strategies Compensations: Minimize environmental distractions;Slow rate;Small sips/bites;Lingual sweep for clearance of pocketing Postural Changes and/or Swallow Maneuvers: Seated upright 90 degrees;Upright 30-60 min after meal                Oral Care Recommendations: Oral care prior to ice chip/H20;Oral care QID Follow up Recommendations: Skilled Nursing facility SLP Visit Diagnosis: Dysphagia, oropharyngeal phase (R13.12);Cognitive communication deficit (R41.841) Plan: Continue with current plan of care       GO              Jamelyn Bovard B. Dreama Saa M.S., CCC-SLP, [facility] Speech-Language Pathologist Rehabilitation Services Office [phone]   Jeremy [place] 02/14/2021, 3:53 PM

## 2021-02-15 LAB — GLUCOSE, CAPILLARY
Glucose-Capillary: 132 mg/dL — ABNORMAL HIGH (ref 70–99)
Glucose-Capillary: 156 mg/dL — ABNORMAL HIGH (ref 70–99)
Glucose-Capillary: 157 mg/dL — ABNORMAL HIGH (ref 70–99)
Glucose-Capillary: 167 mg/dL — ABNORMAL HIGH (ref 70–99)
Glucose-Capillary: 183 mg/dL — ABNORMAL HIGH (ref 70–99)
Glucose-Capillary: 193 mg/dL — ABNORMAL HIGH (ref 70–99)
Glucose-Capillary: 206 mg/dL — ABNORMAL HIGH (ref 70–99)

## 2021-02-15 LAB — URINE CULTURE: Culture: 40000 — AB

## 2021-02-15 MED ORDER — CARVEDILOL 12.5 MG PO TABS
12.5000 mg | ORAL_TABLET | Freq: Two times a day (BID) | ORAL | Status: DC
  Administered 2021-02-16 – 2021-02-25 (×17): 12.5 mg
  Filled 2021-02-15 (×18): qty 1

## 2021-02-15 MED ORDER — LOSARTAN POTASSIUM 50 MG PO TABS
50.0000 mg | ORAL_TABLET | Freq: Every day | ORAL | Status: DC
  Administered 2021-02-15 – 2021-02-22 (×7): 50 mg via ORAL
  Filled 2021-02-15 (×10): qty 1

## 2021-02-15 MED ORDER — FREE WATER
100.0000 mL | Freq: Four times a day (QID) | Status: DC
  Administered 2021-02-15 – 2021-02-16 (×4): 100 mL

## 2021-02-15 NOTE — Progress Notes (Signed)
Physical Therapy Treatment Patient Details Name: Jeremy [place] MRN: [id number] DOB: 01-15-1961 Today's Date: 02/15/2021    History of Present Illness Pt is a 60 yo male that presented from home with respiratory distress 12/25/2020. Underwent defibrillation and CPR, emergently intubated. During admission pt has had a cardiac cath, peg tube and tracheostomy placed, weaned from vent 5/26 AM. 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 Mildly restricted diffusion involving the cortex more diffusely over the right cerebral convexity favored to reflect an acute MCA territory infarct over global hypoxic injury given unilaterality".    PT Comments    Patient is alert and cooperative with treatment. Patient consistently with RLAS V this session. Attempted sit to stand transfers this session. Patient able to clear posterior buttocks from standing x 3 bouts with facilitation for hip and knee extension.  Unable to come to full standing position. Patient is very motivated and would benefit from continued PT to maximize independence and address remaining functional limitations. CIR recommended at discharge.    Follow Up Recommendations  CIR     Equipment Recommendations  None recommended by PT    Recommendations for Other Services       Precautions / Restrictions Precautions Precautions: Fall Restrictions Weight Bearing Restrictions: No    Mobility  Bed Mobility Overal bed mobility: Needs Assistance Bed Mobility: Supine to Sit;Sit to Supine;Rolling     Supine to sit: Mod assist;HOB elevated Sit to supine: Max assist;HOB elevated   General bed mobility comments: assistance for LE and trunk support. verbal cues for technique.    Transfers Overall transfer level: Needs assistance   Transfers: Sit to/from Stand Sit to Stand: Max assist;+2 physical assistance         General transfer comment: 3 bouts of standing attempted. patient able to  clear posterior buttocks x 3 bouts with standing with faciliation for knee and hip extension and maximal verbal cues for technique. short rest breaks between standing bouts. unable to come to full standing  Ambulation/Gait                 Stairs             Wheelchair Mobility    Modified Rankin (Stroke Patients Only)       Balance   Sitting-balance support: Feet supported;Bilateral upper extremity supported Sitting balance-Leahy Scale: Fair                                      Cognition Arousal/Alertness: Awake/alert Behavior During Therapy: WFL for tasks assessed/performed Overall Cognitive Status: Impaired/Different from baseline Area of Impairment: Rancho level;Orientation               [place]o Levels of Cognitive Functioning [place] Mirant Scales of Cognitive Functioning: Confused/inappropriate/non-agitated Orientation Level: Disoriented Youth worker Exercises - Lower Extremity Long Arc Quad: AROM;AAROM;Strengthening;Both;10 reps;Seated Hip ABduction/ADduction: AROM;AAROM;Strengthening;Both;10 reps;Seated Other Exercises Other Exercises: verbal and visual cues for exercise technique. RLE AROM and LLE AAROM    General Comments General comments (skin integrity, edema, etc.): joint approximation and faciliation for LUE extension performed while propped on elbow x 3 bouts.      Pertinent Vitals/Pain Pain Assessment: No/denies pain    Home Living  Prior Function            PT Goals (current goals can now be found in the care plan section) Acute Rehab PT Goals Patient Stated Goal: to get better, to move better PT Goal Formulation: With patient Time For Goal Achievement: 02/25/21 Potential to Achieve Goals: Good Progress towards PT goals: Progressing toward goals    Frequency    Min 2X/week      PT Plan Current plan remains appropriate     Co-evaluation              AM-PAC PT "6 Clicks" Mobility   Outcome Measure  Help needed turning from your back to your side while in a flat bed without using bedrails?: A Lot Help needed moving from lying on your back to sitting on the side of a flat bed without using bedrails?: A Lot Help needed moving to and from a bed to a chair (including a wheelchair)?: Total Help needed standing up from a chair using your arms (e.g., wheelchair or bedside chair)?: Total Help needed to walk in hospital room?: Total Help needed climbing 3-5 steps with a railing? : Total 6 Click Score: 8    End of Session   Activity Tolerance: Patient tolerated treatment well Patient left: in bed;with bed alarm set;with call bell/phone within reach;with nursing/sitter in room Nurse Communication: Mobility status PT Visit Diagnosis: Other abnormalities of gait and mobility (R26.89);Muscle weakness (generalized) (M62.81);Other symptoms and signs involving the nervous system (R29.898)     Time: 5400-8676 PT Time Calculation (min) (ACUTE ONLY): 29 min  Charges:  $Therapeutic Activity: 23-37 mins                     Donna Bernard, PT, MPT    [name] 02/15/2021, 12:30 PM

## 2021-02-15 NOTE — NC FL2 (Signed)
[place] LEVEL OF CARE SCREENING TOOL     IDENTIFICATION  Patient Name: Jeremy Browning Birthdate: Feb 06, 1961 Sex: male Admission Date (Current Location): 12/25/2020  [place] and [place] Number:  Selena Lesser [id number] Facility and Address:  [facility], [address] [place], [place] [address]      Provider Number: [id number]  Attending Physician Name and Address:  Sharen Hones, MD  Relative Name and Phone Number:  Lymon Kidney, sister [phone], Arlen Dupuis, brother [phone]    Current Level of Care: Hospital Recommended Level of Care: [place] Prior Approval Number:    Date Approved/Denied:   PASRR Number: Pending  Discharge Plan: SNF    Current Diagnoses: Patient Active Problem List   Diagnosis Date Noted   Pressure injury of skin 02/10/2021   Oropharyngeal dysphagia    SOB (shortness of breath)    Severe hypoxic-ischemic encephalopathy    HFrEF (heart failure with reduced ejection fraction) ([place])    Cardiogenic shock ([place]) 12/25/2020   Cardiopulmonary arrest with successful resuscitation ([place]) 12/25/2020   Acute renal failure ([place]) 12/25/2020   Transaminitis 12/25/2020   Second degree heart block 12/25/2020   Lactic acidosis 12/25/2020   Aspiration pneumonia ([place]) 12/25/2020   Cardiac arrest with successful resuscitation ([place])    Flash pulmonary edema ([place])    Acute respiratory failure with hypoxia and hypercapnia ([place])     Orientation RESPIRATION BLADDER Height & Weight     Self  Normal Incontinent Weight: 242 lb 15.2 oz (110.2 kg) Height:  6' (182.9 cm)  BEHAVIORAL SYMPTOMS/MOOD NEUROLOGICAL BOWEL NUTRITION STATUS      Incontinent Diet (dys 1, thin)  AMBULATORY STATUS COMMUNICATION OF NEEDS Skin   Extensive Assist Verbally PU Stage and Appropriate Care (closed wound on neck, open wound on knee)   PU Stage 2 Dressing:  (buttocks)                   Personal Care Assistance Level of  Assistance  Bathing, Feeding, Dressing Bathing Assistance: Maximum assistance Feeding assistance: Limited assistance Dressing Assistance: Maximum assistance Total Care Assistance: Maximum assistance   Functional Limitations Info  Sight, Hearing, Speech Sight Info: Adequate Hearing Info: Adequate Speech Info: Adequate    SPECIAL CARE FACTORS FREQUENCY  PT (By licensed PT), OT (By licensed OT)     PT Frequency: 5x OT Frequency: 5x            Contractures Contractures Info: Not present    Additional Factors Info  Code Status, Allergies Code Status Info: DNR Allergies Info: not on file           Current Medications (02/15/2021):  This is the current hospital active medication list Current Facility-Administered Medications  Medication Dose Route Frequency Provider Last Rate Last Admin   acetaminophen (TYLENOL) 160 MG/5ML solution 650 mg  650 mg Per Tube Q6H PRN Fritzi Mandes, MD   650 mg at 02/11/21 0846   albuterol (PROVENTIL) (2.5 MG/3ML) 0.083% nebulizer solution 2.5 mg  2.5 mg Nebulization Q4H PRN Rust-Chester, Britton L, NP   2.5 mg at 01/14/21 1935   aspirin chewable tablet 81 mg  81 mg Per Tube Daily Flora Lipps, MD   81 mg at 02/15/21 0935   carvedilol (COREG) tablet 25 mg  25 mg Per Tube BID WC Fritzi Mandes, MD   25 mg at 02/15/21 0936   chlorhexidine gluconate (MEDLINE KIT) (PERIDEX) 0.12 % solution 15 mL  15 mL Mouth Rinse BID Rust-Chester, Huel Cote, NP  15 mL at 02/14/21 1955   Chlorhexidine Gluconate Cloth 2 % PADS 6 each  6 each Topical Q0600 Leonie Man, MD   6 each at 02/15/21 0525   diclofenac Sodium (VOLTAREN) 1 % topical gel 2 g  2 g Topical QID Vann, Jessica U, DO   2 g at 02/15/21 4097   docusate (COLACE) 50 MG/5ML liquid 100 mg  100 mg Per Tube BID Mauri Brooklyn, MD   100 mg at 02/15/21 0936   docusate (COLACE) 50 MG/5ML liquid 100 mg  100 mg Per Tube BID PRN Eulogio Bear U, DO       enoxaparin (LOVENOX) injection 52.5 mg  0.5 mg/kg Subcutaneous Q24H  Oswald Hillock, RPH   52.5 mg at 02/14/21 1401   famotidine (PEPCID) 40 MG/5ML suspension 20 mg  20 mg Per Tube Daily Mauri Brooklyn, MD   20 mg at 02/15/21 3532   feeding supplement (NEPRO CARB STEADY) liquid 1,600 mL  1,600 mL Per Tube Q24H Val Riles, MD 100 mL/hr at 02/15/21 0530 1,600 mL at 02/15/21 0530   free water 200 mL  200 mL Per Tube Q6H Caren Griffins, MD   200 mL at 02/15/21 0525   hydrALAZINE (APRESOLINE) injection 10 mg  10 mg Intravenous Q4H PRN Minna Merritts, MD   10 mg at 01/21/21 9924   hydrALAZINE (APRESOLINE) tablet 50 mg  50 mg Per Tube TID Fritzi Mandes, MD   50 mg at 02/15/21 0935   insulin aspart (novoLOG) injection 0-15 Units  0-15 Units Subcutaneous TID AC & HS Fritzi Mandes, MD   3 Units at 02/15/21 2683   lip balm (BLISTEX) ointment   Topical PRN Eulogio Bear U, DO       MEDLINE mouth rinse  15 mL Mouth Rinse 10 times per day Rust-Chester, Toribio Harbour L, NP   15 mL at 02/15/21 0526   neomycin-bacitracin-polymyxin (NEOSPORIN) ointment   Topical BID Val Riles, MD   Given at 02/15/21 [phone]   oxyCODONE (Oxy IR/ROXICODONE) immediate release tablet 5 mg  5 mg Per Tube Q6H PRN Flora Lipps, MD   5 mg at 02/08/21 1654   polyethylene glycol (MIRALAX / GLYCOLAX) packet 17 g  17 g Per Tube Daily Mauri Brooklyn, MD   17 g at 02/15/21 2229   polyethylene glycol (MIRALAX / GLYCOLAX) packet 17 g  17 g Per Tube Daily PRN Flora Lipps, MD       polyvinyl alcohol (LIQUIFILM TEARS) 1.4 % ophthalmic solution 2 drop  2 drop Both Eyes PRN Lorella Nimrod, MD   2 drop at 01/25/21 2210   predniSONE (DELTASONE) tablet 40 mg  40 mg Per Tube Q breakfast Eulogio Bear U, DO   40 mg at 02/15/21 0935   sodium chloride flush (NS) 0.9 % injection 10-40 mL  10-40 mL Intracatheter PRN Leonie Man, MD   10 mL at 01/02/21 1008   sodium chloride flush (NS) 0.9 % injection 10-40 mL  10-40 mL Intracatheter PRN Tyler Pita, MD   10 mL at 01/28/21 7989     Discharge Medications: Please see  discharge summary for a list of discharge medications.  Relevant Imaging Results:  Relevant Lab Results:   Additional Information [id number]  Eileen Stanford, LCSW

## 2021-02-15 NOTE — Progress Notes (Signed)
PROGRESS NOTE    Jeremy Browning  QQP:[id number] DOB: Jul 27, 1961 DOA: 12/25/2020 PCP: No primary care provider on file.    Brief Narrative:  Jeremy Browning is an 60 y.o. male with obesity, DM2, HTN, combined CHF who was admitted on 12/25/2020 with respiratory distress.  He was intubated in the ER also had VT requiring epi/cardioversion.  Hospital course complicated by persistent vent dependent respiratory failure eventually requiring tracheostomy, anoxic encephalopathy. Patient had coronary angiogram performed 5/7, reviewed and normal coronary arteries.  Echocardiogram showed ejection fraction 45 to 50% with grade 1 diastolic dysfunction.   Patient since has improved.  [name] is removed.  Patient is able to start eating, also receiving tube feeding.  Currently he is pending SNF placement.     Assessment & Plan:   Active Problems:   Cardiogenic shock (HCC)   Cardiac arrest with successful resuscitation ([place])   Flash pulmonary edema ([place])   Cardiopulmonary arrest with successful resuscitation ([place])   Acute respiratory failure with hypoxia and hypercapnia (HCC)   Acute renal failure (HCC)   Transaminitis   Second degree heart block   Lactic acidosis   Aspiration pneumonia (HCC)   HFrEF (heart failure with reduced ejection fraction) (HCC)   Severe hypoxic-ischemic encephalopathy   SOB (shortness of breath)   Oropharyngeal dysphagia   Pressure injury of skin  #1.  Acute hypoxemic respiratory failure secondary to cardiac arrest. Status post cardiac arrest. Acute on chronic diastolic congestive heart failure. Proteus pneumonia. Dysphagia. Patient seem to have a better appetite now.  Discussed with dietitian, will reduce free water and tube feeding formulation. Patient currently does not have any any evidence of volume overload.  #2.  Type 2 diabetes. Continue sliding scale insulin.  3.  Elevated liver enzymes. Condition improving.  4.  Right knee severe DJD. Symptomatic  treatment.  5.  Anoxic brain injury.  Has a history of CVA. Condition stable.  #6 Essential hypertension. Patient has relative bradycardia, decrease dose of, restart losartan.    DVT prophylaxis: Lovenox Code Status: DNR Family Communication:  Disposition Plan:    Status is: Inpatient  Remains inpatient appropriate because:Unsafe d/c plan  Dispo: The patient is from: Home              Anticipated d/c is to: SNF              Patient currently is medically stable to d/c.   Difficult to place patient Yes        I/O last 3 completed shifts: In: -  Out: 675 [Urine:675] Total I/O In: 360 [P.O.:360] Out: -      Consultants:  None  Procedures: None  Antimicrobials: None  Subjective: Doing well, he is eating, he is also receiving tube feeding via G-tube.  He does not feel any short of breath, he is not on any oxygen.  He has no cough. He denies any fever or chills. No abdominal pain or nausea vomiting No dysuria hematuria No headache or dizziness. No chest pain or palpitation.  Objective: Vitals:   02/15/21 0406 02/15/21 0453 02/15/21 0500 02/15/21 0808  BP: (!) 150/49 (!) 134/51  (!) 138/57  Pulse: 66 62  64  Resp: '16 18  19  ' Temp: 99.4 F (37.4 C) 98 F (36.7 C)  98.7 F (37.1 C)  TempSrc:      SpO2: 100% 100%  97%  Weight:   110.2 kg   Height:        Intake/Output Summary (Last 24 hours) at  02/15/2021 1055 Last data filed at 02/15/2021 0950 Gross per 24 hour  Intake 360 ml  Output 675 ml  Net -315 ml   Filed Weights   02/13/21 0500 02/14/21 0600 02/15/21 0500  Weight: 115 kg 112.6 kg 110.2 kg    Examination:  General exam: Appears calm and comfortable  Respiratory system: Clear to auscultation. Respiratory effort normal. Cardiovascular system: S1 & S2 heard, RRR. No JVD, murmurs, rubs, gallops or clicks. No pedal edema. Gastrointestinal system: Abdomen is nondistended, soft and nontender. No organomegaly or masses felt. Normal bowel  sounds heard. Central nervous system: Alert and oriented. No focal neurological deficits. Extremities: Symmetric 5 x 5 power. Skin: No rashes, lesions or ulcers Psychiatry: Judgement and insight appear normal. Mood & affect appropriate.     Data Reviewed: I have personally reviewed following labs and imaging studies  CBC: Recent Labs  Lab 02/09/21 0430 02/10/21 0452 02/12/21 0414 02/14/21 0644  WBC 16.7* 12.7* 14.0* 14.3*  NEUTROABS 9.3*  --   --   --   HGB 10.4* 10.3* 10.7* 10.8*  HCT 33.1* 34.3* 34.7* 34.8*  MCV 78.3* 80.9 81.5 80.9  PLT 255 301 293 782   Basic Metabolic Panel: Recent Labs  Lab 02/10/21 0452 02/12/21 0414 02/14/21 0644  NA 144 139 136  K 4.1 4.0 3.9  CL 112* 106 106  CO2 '25 26 25  ' GLUCOSE 171* 184* 166*  BUN 55* 52* 46*  CREATININE 1.37* 1.26* 1.13  CALCIUM 9.0 8.9 8.6*  MG 2.5* 2.4 2.2  PHOS 3.9 3.0 3.0   GFR: Estimated Creatinine Clearance: 88 mL/min (by C-G formula based on SCr of 1.13 mg/dL). Liver Function Tests: Recent Labs  Lab 02/12/21 0414 02/14/21 0644  AST 54* 85*  ALT 235* 227*  ALKPHOS 95 88  BILITOT 0.4 0.4  PROT 7.8 7.6  ALBUMIN 2.8* 2.8*   No results for input(s): LIPASE, AMYLASE in the last 168 hours. No results for input(s): AMMONIA in the last 168 hours. Coagulation Profile: No results for input(s): INR, PROTIME in the last 168 hours. Cardiac Enzymes: No results for input(s): CKTOTAL, CKMB, CKMBINDEX, TROPONINI in the last 168 hours. BNP (last 3 results) No results for input(s): PROBNP in the last 8760 hours. HbA1C: No results for input(s): HGBA1C in the last 72 hours. CBG: Recent Labs  Lab 02/14/21 1621 02/14/21 2042 02/15/21 0042 02/15/21 0450 02/15/21 0810  GLUCAP 196* 172* 156* 157* 167*   Lipid Profile: No results for input(s): CHOL, HDL, LDLCALC, TRIG, CHOLHDL, LDLDIRECT in the last 72 hours. Thyroid Function Tests: No results for input(s): TSH, T4TOTAL, FREET4, T3FREE, THYROIDAB in the last 72  hours. Anemia Panel: No results for input(s): VITAMINB12, FOLATE, FERRITIN, TIBC, IRON, RETICCTPCT in the last 72 hours. Sepsis Labs: No results for input(s): PROCALCITON, LATICACIDVEN in the last 168 hours.  Recent Results (from the past 240 hour(s))  CULTURE, BLOOD (ROUTINE X 2) w Reflex to ID Panel     Status: None   Collection Time: 02/07/21  4:07 PM   Specimen: BLOOD  Result Value Ref Range Status   Specimen Description BLOOD BLOOD LEFT HAND  Final   Special Requests   Final    BOTTLES DRAWN AEROBIC AND ANAEROBIC Blood Culture adequate volume   Culture   Final    NO GROWTH 5 DAYS Performed at [facility], [address] [place], [place] [address]    Report Status 02/12/2021 FINAL  Final  CULTURE, BLOOD (ROUTINE X 2) w Reflex to ID  Panel     Status: None   Collection Time: 02/07/21  5:11 PM   Specimen: BLOOD  Result Value Ref Range Status   Specimen Description BLOOD BLOOD LEFT HAND  Final   Special Requests   Final    BOTTLES DRAWN AEROBIC AND ANAEROBIC Blood Culture adequate volume   Culture   Final    NO GROWTH 5 DAYS Performed at [facility], [address] [place], [place] [address]    Report Status 02/12/2021 FINAL  Final  Urine Culture     Status: Abnormal (Preliminary result)   Collection Time: 02/12/21  1:15 PM   Specimen: Urine, Random  Result Value Ref Range Status   Specimen Description   Final    URINE, RANDOM Performed at [facility], [address] [place], [place] [address]    Special Requests   Final    NONE Performed at [facility], [address], [place], [place] [address]    Culture (A)  Final    40,000 COLONIES/mL PROTEUS MIRABILIS SUSCEPTIBILITIES TO FOLLOW Performed at [facility], [place] [address], [place], [place] [address]    Report Status PENDING  Incomplete         Radiology Studies: No results found.      Scheduled Meds:  aspirin  81 mg Per Tube Daily   carvedilol   12.5 mg Per Tube BID WC   chlorhexidine gluconate (MEDLINE KIT)  15 mL Mouth Rinse BID   Chlorhexidine Gluconate Cloth  6 each Topical Q0600   diclofenac Sodium  2 g Topical QID   docusate  100 mg Per Tube BID   enoxaparin (LOVENOX) injection  0.5 mg/kg Subcutaneous Q24H   famotidine  20 mg Per Tube Daily   feeding supplement (NEPRO CARB STEADY)  1,600 mL Per Tube Q24H   free water  100 mL Per Tube Q6H   hydrALAZINE  50 mg Per Tube TID   insulin aspart  0-15 Units Subcutaneous TID AC & HS   losartan  50 mg Oral Daily   mouth rinse  15 mL Mouth Rinse 10 times per day   neomycin-bacitracin-polymyxin   Topical BID   polyethylene glycol  17 g Per Tube Daily   predniSONE  40 mg Per Tube Q breakfast   Continuous Infusions:   LOS: 52 days    Time spent: 28 minutes    Sharen Hones, MD [facility]   To contact the attending provider between 7A-7P or the covering provider during after hours 7P-7A, please log into the web site [email or web address] and access using universal [place] password for that web site. If you do not have the password, please call the hospital operator.  02/15/2021, 10:55 AM

## 2021-02-15 NOTE — Progress Notes (Signed)
Occupational Therapy Treatment Patient Details Name: Jeremy Browning MRN: [id number] DOB: 07-Jan-1961 Today's Date: 02/15/2021    History of present illness Pt is a 60 yo male that presented from home with respiratory distress 12/25/2020. Underwent defibrillation and CPR, emergently intubated. During admission pt has had a cardiac cath, peg tube and tracheostomy placed, weaned from vent 5/26 AM. 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 Mildly restricted diffusion involving the cortex more diffusely over the right cerebral convexity favored to reflect an acute MCA territory infarct over global hypoxic injury given unilaterality".   OT comments  Upon entering the room, pt supine in bed and agreeable to OT intervention. Pt recognized this therapist and greets me pleasantly. Pt oriented to self and location with cuing needed for situation and correct month. He did correctly state the day of the week when asked. Pt performed oral care and washed face with min cuing and use of R UE. Pt with only trace movement in L digits with PROM and then pt performing wrist/elbow/shoulder A/AROM exercises in all planes of movement 2 sets of 10 each. Pt making excellent progress. When taking rest breaks pt reports an interest in movies by Eliezer Lofts and was able to name 10 title of his books and movies. Pt remained in bed at end of session with all needs within reach.    Follow Up Recommendations  SNF;Supervision/Assistance - 24 hour    Equipment Recommendations  Other (comment) (defer to next venue of care)       Precautions / Restrictions Precautions Precautions: Fall Precaution Comments: PEG Restrictions Weight Bearing Restrictions: No       Mobility Bed Mobility Overal bed mobility: Needs Assistance Bed Mobility: Supine to Sit;Sit to Supine;Rolling     Supine to sit: Mod assist;HOB elevated Sit to supine: Max assist;HOB elevated   General bed  mobility comments: assistance for LE and trunk support. verbal cues for technique.    Transfers Overall transfer level: Needs assistance   Transfers: Sit to/from Stand Sit to Stand: Max assist;+2 physical assistance         General transfer comment: 3 bouts of standing attempted. patient able to clear posterior buttocks x 3 bouts with standing with faciliation for knee and hip extension and maximal verbal cues for technique. short rest breaks between standing bouts. unable to come to full standing    Balance   Sitting-balance support: Feet supported;Bilateral upper extremity supported Sitting balance-Leahy Scale: Fair                                     ADL either performed or assessed with clinical judgement   ADL       Grooming: Wash/dry face;Set up;Oral care Grooming Details (indicate cue type and reason): min cuing to initiate task                                     Vision Patient Visual Report: No change from baseline            Cognition Arousal/Alertness: Awake/alert Behavior During Therapy: WFL for tasks assessed/performed Overall Cognitive Status: Impaired/Different from baseline Area of Impairment: [place] level;Orientation               [place]ancho Levels of Cognitive Functioning [place] Mirant Scales of Cognitive Functioning: Confused/inappropriate/non-agitated Orientation  Level: Disoriented to;Time;Situation                      Exercises General Exercises - Lower Extremity Long Arc Quad: AROM;AAROM;Strengthening;Both;10 reps;Seated Hip ABduction/ADduction: AROM;AAROM;Strengthening;Both;10 reps;Seated Other Exercises Other Exercises: verbal and visual cues for exercise technique. RLE AROM and LLE AAROM      General Comments joint approximation and faciliation for LUE extension performed while propped on elbow x 3 bouts.    Pertinent Vitals/ Pain       Pain Assessment: Faces Faces Pain Scale: No hurt          Frequency  Min 2X/week        Progress Toward Goals  OT Goals(current goals can now be found in the care plan section)  Progress towards OT goals: Progressing toward goals  Acute Rehab OT Goals Patient Stated Goal: to get better, to move better OT Goal Formulation: With patient Time For Goal Achievement: 02/21/21 Potential to Achieve Goals: Fair  Plan Discharge plan remains appropriate;Frequency remains appropriate       AM-PAC OT "6 Clicks" Daily Activity     Outcome Measure   Help from another person eating meals?: A Little Help from another person taking care of personal grooming?: A Little Help from another person toileting, which includes using toliet, bedpan, or urinal?: Total Help from another person bathing (including washing, rinsing, drying)?: A Lot Help from another person to put on and taking off regular upper body clothing?: A Lot Help from another person to put on and taking off regular lower body clothing?: Total 6 Click Score: 12    End of Session    OT Visit Diagnosis: Other symptoms and signs involving the nervous system (R29.898);Other symptoms and signs involving cognitive function   Activity Tolerance Patient tolerated treatment well   Patient Left with call bell/phone within reach;with bed alarm set;in bed           Time: 1027-1050 OT Time Calculation (min): 23 min  Charges: OT General Charges $OT Visit: 1 Visit OT Treatments $Self Care/Home Management : 8-22 mins $Neuromuscular Re-education: 8-22 mins  Jackquline Denmark, MS, OTR/L , CBIS ascom [phone]  02/15/21, 3:30 PM

## 2021-02-16 DIAGNOSIS — I10 Essential (primary) hypertension: Secondary | ICD-10-CM

## 2021-02-16 DIAGNOSIS — M1711 Unilateral primary osteoarthritis, right knee: Secondary | ICD-10-CM

## 2021-02-16 LAB — GLUCOSE, CAPILLARY
Glucose-Capillary: 114 mg/dL — ABNORMAL HIGH (ref 70–99)
Glucose-Capillary: 131 mg/dL — ABNORMAL HIGH (ref 70–99)
Glucose-Capillary: 132 mg/dL — ABNORMAL HIGH (ref 70–99)
Glucose-Capillary: 165 mg/dL — ABNORMAL HIGH (ref 70–99)
Glucose-Capillary: 198 mg/dL — ABNORMAL HIGH (ref 70–99)
Glucose-Capillary: 201 mg/dL — ABNORMAL HIGH (ref 70–99)
Glucose-Capillary: 209 mg/dL — ABNORMAL HIGH (ref 70–99)

## 2021-02-16 LAB — BASIC METABOLIC PANEL
Anion gap: 9 (ref 5–15)
BUN: 44 mg/dL — ABNORMAL HIGH (ref 8–23)
CO2: 24 mmol/L (ref 22–32)
Calcium: 8.9 mg/dL (ref 8.9–10.3)
Chloride: 103 mmol/L (ref 98–111)
Creatinine, Ser: 1.12 mg/dL (ref 0.61–1.24)
GFR, Estimated: >60 mL/min (ref >60)
Glucose, Bld: 201 mg/dL — ABNORMAL HIGH (ref 70–99)
Potassium: 4.1 mmol/L (ref 3.5–5.1)
Sodium: 136 mmol/L (ref 135–145)

## 2021-02-16 LAB — MAGNESIUM: Magnesium: 2.1 mg/dL (ref 1.7–2.4)

## 2021-02-16 LAB — PHOSPHORUS: Phosphorus: 3.3 mg/dL (ref 2.5–4.6)

## 2021-02-16 MED ORDER — NEPRO/CARBSTEADY PO LIQD
1000.0000 mL | ORAL | Status: DC
  Administered 2021-02-16 – 2021-02-17 (×2): 1000 mL

## 2021-02-16 MED ORDER — FREE WATER
150.0000 mL | Status: DC
  Administered 2021-02-16 – 2021-02-26 (×61): 150 mL

## 2021-02-16 MED ORDER — NEPRO/CARBSTEADY PO LIQD
237.0000 mL | Freq: Two times a day (BID) | ORAL | Status: DC
  Administered 2021-02-16 – 2021-02-18 (×4): 237 mL via ORAL

## 2021-02-16 NOTE — Progress Notes (Signed)
PROGRESS NOTE    Jeremy Browning  BZJ:[id number] DOB: 26-Jan-1961 DOA: 12/25/2020 PCP: No primary care provider on file.    Brief Narrative:  Jeremy Browning is an 60 y.o. male with obesity, DM2, HTN, combined CHF who was admitted on 12/25/2020 with respiratory distress.  He was intubated in the ER also had VT requiring epi/cardioversion.  Hospital course complicated by persistent vent dependent respiratory failure eventually requiring tracheostomy, anoxic encephalopathy. Patient had coronary angiogram performed 5/7, reviewed and normal coronary arteries.  Echocardiogram showed ejection fraction 45 to 50% with grade 1 diastolic dysfunction.   Patient since has improved.  [name] is removed.  Patient is able to start eating, also receiving tube feeding.  Currently he is pending SNF placement.   Assessment & Plan:   Active Problems:   Cardiogenic shock (HCC)   Cardiac arrest with successful resuscitation ([place])   Flash pulmonary edema ([place])   Cardiopulmonary arrest with successful resuscitation ([place])   Acute respiratory failure with hypoxia and hypercapnia (HCC)   Acute renal failure (HCC)   Transaminitis   Second degree heart block   Lactic acidosis   Aspiration pneumonia (HCC)   HFrEF (heart failure with reduced ejection fraction) (HCC)   Severe hypoxic-ischemic encephalopathy   SOB (shortness of breath)   Oropharyngeal dysphagia   Pressure injury of skin  #1.  Acute hypoxemic respiratory failure secondary to cardiac arrest. Status post cardiac arrest. Acute on chronic diastolic congestive heart failure. Proteus pneumonia. Dysphagia. Condition had improved.  Patient has good appetite, will reconsult dietitian, I will discontinue tube feeding for now.  #2.  Type 2 diabetes  Continue sliding scale insulin.  3.  Anoxic brain injury. History of CVA. Condition stable.    DVT prophylaxis: Lovenox Code Status: DNR Family Communication:  Disposition Plan:    Status is:  Inpatient  Remains inpatient appropriate because:Inpatient level of care appropriate due to severity of illness  Dispo: The patient is from: Home              Anticipated d/c is to: SNF              Patient currently is medically stable to d/c.   Difficult to place patient Yes        I/O last 3 completed shifts: In: 600 [P.O.:600] Out: -  Total I/O In: 360 [P.O.:360] Out: -      Consultants:  None  Procedures: None  Antimicrobials: None   Subjective: Patient doing well today, currently has good appetite without nausea vomiting. Has good appetite without nausea vomiting. Denies any short of breath or cough. No abdominal pain. No fever or chills. No dysuria hematuria.  Objective: Vitals:   02/15/21 2005 02/16/21 0453 02/16/21 0500 02/16/21 0751  BP: (!) 130/48 (!) 145/60  (!) 140/30  Pulse: 61 (!) 57  61  Resp: '14 14  18  ' Temp: 97.8 F (36.6 C) 98.2 F (36.8 C)  98.2 F (36.8 C)  TempSrc:    Oral  SpO2: 98% 100%  100%  Weight:   114.6 kg   Height:        Intake/Output Summary (Last 24 hours) at 02/16/2021 1053 Last data filed at 02/16/2021 1021 Gross per 24 hour  Intake 600 ml  Output --  Net 600 ml   Filed Weights   02/14/21 0600 02/15/21 0500 02/16/21 0500  Weight: 112.6 kg 110.2 kg 114.6 kg    Examination:  General exam: Appears calm and comfortable  Respiratory system: Clear to auscultation. Respiratory  effort normal. Cardiovascular system: S1 & S2 heard, RRR. No JVD, murmurs, rubs, gallops or clicks. No pedal edema. Gastrointestinal system: Abdomen is nondistended, soft and nontender. No organomegaly or masses felt. Normal bowel sounds heard. Central nervous system: Alert and oriented x2.  No focal neurological deficits. Extremities: Symmetric 5 x 5 power. Skin: No rashes, lesions or ulcers Psychiatry: Judgement and insight appear normal. Mood & affect appropriate.     Data Reviewed: I have personally reviewed following labs and imaging  studies  CBC: Recent Labs  Lab 02/10/21 0452 02/12/21 0414 02/14/21 0644  WBC 12.7* 14.0* 14.3*  HGB 10.3* 10.7* 10.8*  HCT 34.3* 34.7* 34.8*  MCV 80.9 81.5 80.9  PLT 301 293 161   Basic Metabolic Panel: Recent Labs  Lab 02/10/21 0452 02/12/21 0414 02/14/21 0644  NA 144 139 136  K 4.1 4.0 3.9  CL 112* 106 106  CO2 '25 26 25  ' GLUCOSE 171* 184* 166*  BUN 55* 52* 46*  CREATININE 1.37* 1.26* 1.13  CALCIUM 9.0 8.9 8.6*  MG 2.5* 2.4 2.2  PHOS 3.9 3.0 3.0   GFR: Estimated Creatinine Clearance: 89.7 mL/min (by C-G formula based on SCr of 1.13 mg/dL). Liver Function Tests: Recent Labs  Lab 02/12/21 0414 02/14/21 0644  AST 54* 85*  ALT 235* 227*  ALKPHOS 95 88  BILITOT 0.4 0.4  PROT 7.8 7.6  ALBUMIN 2.8* 2.8*   No results for input(s): LIPASE, AMYLASE in the last 168 hours. No results for input(s): AMMONIA in the last 168 hours. Coagulation Profile: No results for input(s): INR, PROTIME in the last 168 hours. Cardiac Enzymes: No results for input(s): CKTOTAL, CKMB, CKMBINDEX, TROPONINI in the last 168 hours. BNP (last 3 results) No results for input(s): PROBNP in the last 8760 hours. HbA1C: No results for input(s): HGBA1C in the last 72 hours. CBG: Recent Labs  Lab 02/15/21 1637 02/15/21 2003 02/16/21 0014 02/16/21 0454 02/16/21 0747  GLUCAP 183* 206* 165* 114* 132*   Lipid Profile: No results for input(s): CHOL, HDL, LDLCALC, TRIG, CHOLHDL, LDLDIRECT in the last 72 hours. Thyroid Function Tests: No results for input(s): TSH, T4TOTAL, FREET4, T3FREE, THYROIDAB in the last 72 hours. Anemia Panel: No results for input(s): VITAMINB12, FOLATE, FERRITIN, TIBC, IRON, RETICCTPCT in the last 72 hours. Sepsis Labs: No results for input(s): PROCALCITON, LATICACIDVEN in the last 168 hours.  Recent Results (from the past 240 hour(s))  CULTURE, BLOOD (ROUTINE X 2) w Reflex to ID Panel     Status: None   Collection Time: 02/07/21  4:07 PM   Specimen: BLOOD  Result  Value Ref Range Status   Specimen Description BLOOD BLOOD LEFT HAND  Final   Special Requests   Final    BOTTLES DRAWN AEROBIC AND ANAEROBIC Blood Culture adequate volume   Culture   Final    NO GROWTH 5 DAYS Performed at [facility], [address] [place], [place] [address]    Report Status 02/12/2021 FINAL  Final  CULTURE, BLOOD (ROUTINE X 2) w Reflex to ID Panel     Status: None   Collection Time: 02/07/21  5:11 PM   Specimen: BLOOD  Result Value Ref Range Status   Specimen Description BLOOD BLOOD LEFT HAND  Final   Special Requests   Final    BOTTLES DRAWN AEROBIC AND ANAEROBIC Blood Culture adequate volume   Culture   Final    NO GROWTH 5 DAYS Performed at [facility], [address], [place], [place] [address]  Report Status 02/12/2021 FINAL  Final  Urine Culture     Status: Abnormal   Collection Time: 02/12/21  1:15 PM   Specimen: Urine, Random  Result Value Ref Range Status   Specimen Description   Final    URINE, RANDOM Performed at [facility], [address], [place], [place] [address]    Special Requests   Final    NONE Performed at [facility], [place], [place] [address]    Culture 40,000 COLONIES/mL PROTEUS MIRABILIS (A)  Final   Report Status 02/15/2021 FINAL  Final   Organism ID, Bacteria PROTEUS MIRABILIS (A)  Final      Susceptibility   Proteus mirabilis - MIC*    AMPICILLIN <=2 SENSITIVE Sensitive     CEFAZOLIN <=4 SENSITIVE Sensitive     CEFEPIME <=0.12 SENSITIVE Sensitive     CEFTRIAXONE <=0.25 SENSITIVE Sensitive     CIPROFLOXACIN <=0.25 SENSITIVE Sensitive     GENTAMICIN <=1 SENSITIVE Sensitive     IMIPENEM 2 SENSITIVE Sensitive     NITROFURANTOIN 128 RESISTANT Resistant     TRIMETH/SULFA <=20 SENSITIVE Sensitive     AMPICILLIN/SULBACTAM <=2 SENSITIVE Sensitive     PIP/TAZO <=4 SENSITIVE Sensitive     * 40,000 COLONIES/mL PROTEUS MIRABILIS         Radiology Studies: No  results found.      Scheduled Meds:  aspirin  81 mg Per Tube Daily   carvedilol  12.5 mg Per Tube BID WC   chlorhexidine gluconate (MEDLINE KIT)  15 mL Mouth Rinse BID   Chlorhexidine Gluconate Cloth  6 each Topical Q0600   diclofenac Sodium  2 g Topical QID   docusate  100 mg Per Tube BID   enoxaparin (LOVENOX) injection  0.5 mg/kg Subcutaneous Q24H   famotidine  20 mg Per Tube Daily   feeding supplement (NEPRO CARB STEADY)  1,600 mL Per Tube Q24H   free water  100 mL Per Tube Q6H   hydrALAZINE  50 mg Per Tube TID   insulin aspart  0-15 Units Subcutaneous TID AC & HS   losartan  50 mg Oral Daily   mouth rinse  15 mL Mouth Rinse 10 times per day   neomycin-bacitracin-polymyxin   Topical BID   polyethylene glycol  17 g Per Tube Daily   predniSONE  40 mg Per Tube Q breakfast   Continuous Infusions:   LOS: 53 days    Time spent: 26 minutes    Sharen Hones, MD [facility]   To contact the attending provider between 7A-7P or the covering provider during after hours 7P-7A, please log into the web site [email or web address] and access using universal [place] password for that web site. If you do not have the password, please call the hospital operator.  02/16/2021, 10:53 AM

## 2021-02-16 NOTE — Progress Notes (Signed)
Physical Therapy Treatment Patient Details Name: Jeremy Browning MRN: [id number] DOB: Jan 19, 1961 Today's Date: 02/16/2021    History of Present Illness Pt is a 60 yo male that presented from home with respiratory distress 12/25/2020. Underwent defibrillation and CPR, emergently intubated. During admission pt has had a cardiac cath, peg tube and tracheostomy placed, weaned from vent 5/26 AM. 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 Mildly restricted diffusion involving the cortex more diffusely over the right cerebral convexity favored to reflect an acute MCA territory infarct over global hypoxic injury given unilaterality".    PT Comments    Patient alert, displaying characteristics of level VI today. Able to recall previous meals, followed all commands, with occasionally extended time. The patient continues to demonstrate improvement towards goals, and remains highly motivated. Supine to sit with minA and bed rails this session, and tolerated seated exercises for at least 30 minutes. modAx2 to lateral scoot to recliner. Pt set up with meals and drawings. The patient would benefit from further skilled PT intervention to continue to progress towards goals. Recommendation remains appropriate.     Follow Up Recommendations  CIR     Equipment Recommendations  None recommended by PT    Recommendations for Other Services Speech consult     Precautions / Restrictions Precautions Precautions: Fall Precaution Comments: PEG Restrictions Weight Bearing Restrictions: No    Mobility  Bed Mobility Overal bed mobility: Needs Assistance Bed Mobility: Supine to Sit     Supine to sit: Min assist          Transfers Overall transfer level: Needs assistance Equipment used: Sliding board Transfers: Sit to/from Stand;Lateral/Scoot Transfers Sit to Stand: Max assist;+2 physical assistance        Lateral/Scoot Transfers: Mod assist;+2 physical  assistance General transfer comment: several bouts of standing with maxAx2, pt able to clear buttocks from bed several times. bilateral knee blocking throughout. able to begin scooting process to R with multimodal cues  Ambulation/Gait                 Stairs             Wheelchair Mobility    Modified Rankin (Stroke Patients Only)       Balance Overall balance assessment: Needs assistance Sitting-balance support: Feet supported Sitting balance-Leahy Scale: Fair Sitting balance - Comments: supervision     Standing balance-Leahy Scale: Zero                              Cognition Arousal/Alertness: Awake/alert Behavior During Therapy: WFL for tasks assessed/performed Overall Cognitive Status: Within Functional Limits for tasks assessed Area of Impairment: Rancho level;Orientation               Rancho Levels of Cognitive Functioning [place] Mirant Scales of Cognitive Functioning: Confused/appropriate               General Comments: oriented to self, place      Exercises Other Exercises Other Exercises: seateed AAROM LAQ on LLE, AROM on RLE. pronation/supination of L forearm x15, assisted scapular protraction/retraction, propping on L elbow in sitting Other Exercises: set up with dinner tray as well as assisted with drawing, pt occasionally needed cueing return to task    General Comments        Pertinent Vitals/Pain Pain Assessment: Faces Faces Pain Scale: No hurt    Home Living  Prior Function            PT Goals (current goals can now be found in the care plan section) Progress towards PT goals: Progressing toward goals    Frequency    Min 2X/week      PT Plan Current plan remains appropriate    Co-evaluation              AM-PAC PT "6 Clicks" Mobility   Outcome Measure  Help needed turning from your back to your side while in a flat bed without using bedrails?: A  Lot Help needed moving from lying on your back to sitting on the side of a flat bed without using bedrails?: A Lot Help needed moving to and from a bed to a chair (including a wheelchair)?: A Lot Help needed standing up from a chair using your arms (e.g., wheelchair or bedside chair)?: A Lot Help needed to walk in hospital room?: Total Help needed climbing 3-5 steps with a railing? : Total 6 Click Score: 10    End of Session Equipment Utilized During Treatment: Gait belt Activity Tolerance: Patient tolerated treatment well Patient left: in chair;with chair alarm set;with call bell/phone within reach Nurse Communication: Mobility status PT Visit Diagnosis: Other abnormalities of gait and mobility (R26.89);Muscle weakness (generalized) (M62.81);Other symptoms and signs involving the nervous system (H29.924)     Time: 1557-1650 PT Time Calculation (min) (ACUTE ONLY): 53 min  Charges:  $Therapeutic Exercise: 8-22 mins $Therapeutic Activity: 38-52 mins                     Olga Coaster PT, DPT 5:08 PM,02/16/21

## 2021-02-16 NOTE — Progress Notes (Signed)
Nutrition Follow Up Note   DOCUMENTATION CODES:  Obesity unspecified  INTERVENTION:  Continue current diet as ordered. Advance as able per SLP Initiate 48-hour calorie count to determine amount of kcal and protein being consumed throughout the day at meals Nepro Shake po BID, each supplement provides 425 kcal and 19 grams protein Adjust nocturnal feeds of Nepro 1.8 to 70 ml/hr x 12 hours (2000-0800) for total volume of 840 mL Free water flushes to 155m q4 hours  Regimen provides 1487 kcal/day, 68 g/day protein and 1510 mL/day free water   NUTRITION DIAGNOSIS:  Inadequate oral intake related to inability to eat (pt sedated and ventilated) as evidenced by NPO status.  GOAL:  Patient will meet greater than or equal to 90% of their needs  -previous met with tube feeds,    MONITOR:  Labs, Weight trends, TF tolerance, Skin, I & O's  ASSESSMENT:  60y.o. black male with diabetes mellitus type II, hypertension and congestive heart failure [facility] and flash pulmonary edema  Pt s/p PEG tube 5/24  Pt s/p tracheostomy 5/25  Pt seen by SLP and placed on a dysphagia 1 diet 6/22. RD switched pt to nocturnal feeds 6/23 to try and encourage increased oral intake during the day. Rate was meeting 100% of needs through TF.   MD notes an increase in oral intake and requests that rate of TF be adjusted. Will initiate 48 hour calorie count to determine the amount of kcal being consumed during the day.   Reviewed recorded intake since last assessment. Overall pt averaging ~50% of a puree diet (likely consuming ~1250kcal and 45g of protein through his meals). Will have a better estimate after calorie count. For now, would favor continuing nocturnal feeds but will adjust rate so that they meet 50% of estimated needs. Would not recommend discontinuing TF until pt can consistently meet greater than >85% of estimated needs orally as pt has lost a significant  amount of weight since admission.   Pt will be able to discharge to SNF with PEG tube and can be weaned from there as appropriate.   Average Meal Intake: 6/23-6/29: 52% intake x 10 recorded meals (25-100% consumed)  Nutritionally Relevant Medications: Scheduled Meds:  docusate  100 mg Per Tube BID   famotidine  20 mg Per Tube Daily   insulin aspart  0-15 Units Subcutaneous TID AC & HS   polyethylene glycol  17 g Per Tube Daily   predniSONE  40 mg Per Tube Q breakfast   PRN Meds: docusate, polyethylene glycol  Labs Reviewed: SBG ranges from 114-193 mg/dL over the last 24 hours BUN 46 Vitamin D-25 25.76  Diet Order:   Diet Order             DIET - DYS 1 Room service appropriate? Yes; Fluid consistency: Thin  Diet effective now                  EDUCATION NEEDS:  No education needs have been identified at this time  Skin:  Skin Assessment: Skin Integrity Issues: Skin Integrity Issues:: Stage II Stage II: butocks, incision neck s/p trach   Last BM:  6/29 - type 7  Height:  Ht Readings from Last 1 Encounters:  02/05/21 6' (1.829 m)    Weight:  Wt Readings from Last 1 Encounters:  02/16/21 114.6 kg   BMI:  Body mass index is 34.27 kg/m.  Estimated Nutritional Needs:  Kcal:  2500-2800 kcal/d Protein:  125-140g/day Fluid:  2.4-2.7L/day  Jeremy Browning, RD, LDN Clinical Dietitian Pager on [place]

## 2021-02-17 DIAGNOSIS — J1569 Pneumonia due to other gram-negative bacteria: Secondary | ICD-10-CM

## 2021-02-17 DIAGNOSIS — J156 Pneumonia due to other aerobic Gram-negative bacteria: Secondary | ICD-10-CM

## 2021-02-17 DIAGNOSIS — I5033 Acute on chronic diastolic (congestive) heart failure: Secondary | ICD-10-CM

## 2021-02-17 HISTORY — DX: Pneumonia due to other gram-negative bacteria: J15.6

## 2021-02-17 HISTORY — DX: Pneumonia due to other gram-negative bacteria: J15.69

## 2021-02-17 LAB — GLUCOSE, CAPILLARY
Glucose-Capillary: 104 mg/dL — ABNORMAL HIGH (ref 70–99)
Glucose-Capillary: 116 mg/dL — ABNORMAL HIGH (ref 70–99)
Glucose-Capillary: 117 mg/dL — ABNORMAL HIGH (ref 70–99)
Glucose-Capillary: 120 mg/dL — ABNORMAL HIGH (ref 70–99)
Glucose-Capillary: 165 mg/dL — ABNORMAL HIGH (ref 70–99)
Glucose-Capillary: 165 mg/dL — ABNORMAL HIGH (ref 70–99)
Glucose-Capillary: 256 mg/dL — ABNORMAL HIGH (ref 70–99)

## 2021-02-17 NOTE — Progress Notes (Signed)
PROGRESS NOTE    Jeremy Browning  OZH:[id number] DOB: April 19, 1961 DOA: 12/25/2020 PCP: No primary care provider on file.    Brief Narrative:  Jeremy Browning is an 60 y.o. male with obesity, DM2, HTN, combined CHF who was admitted on 12/25/2020 with respiratory distress.  He was intubated in the ER also had VT requiring epi/cardioversion.  Hospital course complicated by persistent vent dependent respiratory failure eventually requiring tracheostomy, anoxic encephalopathy. Patient had coronary angiogram performed 5/7, reviewed and normal coronary arteries.  Echocardiogram showed ejection fraction 45 to 50% with grade 1 diastolic dysfunction.   Patient since has improved.  [name] is removed.  Patient is able to start eating, also receiving tube feeding.  Currently he is pending SNF placement.   Assessment & Plan:   Active Problems:   Cardiogenic shock (HCC)   Cardiac arrest with successful resuscitation ([place])   Flash pulmonary edema ([place])   Cardiopulmonary arrest with successful resuscitation ([place])   Acute respiratory failure with hypoxia and hypercapnia (HCC)   Acute renal failure (HCC)   Transaminitis   Second degree heart block   Lactic acidosis   Aspiration pneumonia (HCC)   HFrEF (heart failure with reduced ejection fraction) (HCC)   Severe hypoxic-ischemic encephalopathy   SOB (shortness of breath)   Oropharyngeal dysphagia   Pressure injury of skin   Right knee DJD   Essential hypertension  Patient doing well.  Discussed with the physical therapist, also discussed with social worker, currently there is no bed offer.  We will continue PT OT while in the hospital. Also spoke with dietitian, tube feeding has changed to nocturnal with daytime eating.  Continue to monitor intake.    DVT prophylaxis: Lovenox Code Status: DNR Family Communication:  Disposition Plan:    Status is: Inpatient  Remains inpatient appropriate because:Unsafe d/c plan  Dispo: The patient is from:  Home              Anticipated d/c is to:  SNF pending              Patient currently is medically stable to d/c.   Difficult to place patient Yes        I/O last 3 completed shifts: In: 360 [P.O.:360] Out: 700 [Urine:700] No intake/output data recorded.     Consultants:  None  Procedures: None  Antimicrobials: None   Subjective: Patient doing well today.  His appetite is gradually improving, he also required nocturnal tube feeding.  No nausea vomiting abdominal pain. No short of breath or cough. No dysuria hematuria.  Objective: Vitals:   02/16/21 2000 02/17/21 0421 02/17/21 0720 02/17/21 0815  BP: (!) 134/55 (!) 139/56 (!) 146/66 (!) 156/57  Pulse: (!) 53 (!) 58 64 82  Resp: '18 18 18 18  ' Temp: 98.2 F (36.8 C) 98.2 F (36.8 C) 98.3 F (36.8 C) 98 F (36.7 C)  TempSrc:   Oral   SpO2: 100% 100% 100% 100%  Weight:      Height:        Intake/Output Summary (Last 24 hours) at 02/17/2021 1015 Last data filed at 02/16/2021 1840 Gross per 24 hour  Intake 360 ml  Output 700 ml  Net -340 ml   Filed Weights   02/14/21 0600 02/15/21 0500 02/16/21 0500  Weight: 112.6 kg 110.2 kg 114.6 kg    Examination:  General exam: Appears calm and comfortable  Respiratory system: Clear to auscultation. Respiratory effort normal. Cardiovascular system: S1 & S2 heard, RRR. No JVD, murmurs, rubs, gallops  or clicks. Gastrointestinal system: Abdomen is nondistended, soft and nontender. No organomegaly or masses felt. Normal bowel sounds heard. Central nervous system: Alert and oriented. No focal neurological deficits. Extremities: 1-2+ chronic leg edema Skin: No rashes, lesions or ulcers Psychiatry: Judgement and insight appear normal. Mood & affect appropriate.     Data Reviewed: I have personally reviewed following labs and imaging studies  CBC: Recent Labs  Lab 02/12/21 0414 02/14/21 0644  WBC 14.0* 14.3*  HGB 10.7* 10.8*  HCT 34.7* 34.8*  MCV 81.5 80.9  PLT 293  924   Basic Metabolic Panel: Recent Labs  Lab 02/12/21 0414 02/14/21 0644 02/16/21 1115  NA 139 136 136  K 4.0 3.9 4.1  CL 106 106 103  CO2 '26 25 24  ' GLUCOSE 184* 166* 201*  BUN 52* 46* 44*  CREATININE 1.26* 1.13 1.12  CALCIUM 8.9 8.6* 8.9  MG 2.4 2.2 2.1  PHOS 3.0 3.0 3.3   GFR: Estimated Creatinine Clearance: 90.5 mL/min (by C-G formula based on SCr of 1.12 mg/dL). Liver Function Tests: Recent Labs  Lab 02/12/21 0414 02/14/21 0644  AST 54* 85*  ALT 235* 227*  ALKPHOS 95 88  BILITOT 0.4 0.4  PROT 7.8 7.6  ALBUMIN 2.8* 2.8*   No results for input(s): LIPASE, AMYLASE in the last 168 hours. No results for input(s): AMMONIA in the last 168 hours. Coagulation Profile: No results for input(s): INR, PROTIME in the last 168 hours. Cardiac Enzymes: No results for input(s): CKTOTAL, CKMB, CKMBINDEX, TROPONINI in the last 168 hours. BNP (last 3 results) No results for input(s): PROBNP in the last 8760 hours. HbA1C: No results for input(s): HGBA1C in the last 72 hours. CBG: Recent Labs  Lab 02/16/21 2003 02/16/21 2325 02/17/21 0421 02/17/21 0721 02/17/21 0816  GLUCAP 201* 131* 120* 116* 117*   Lipid Profile: No results for input(s): CHOL, HDL, LDLCALC, TRIG, CHOLHDL, LDLDIRECT in the last 72 hours. Thyroid Function Tests: No results for input(s): TSH, T4TOTAL, FREET4, T3FREE, THYROIDAB in the last 72 hours. Anemia Panel: No results for input(s): VITAMINB12, FOLATE, FERRITIN, TIBC, IRON, RETICCTPCT in the last 72 hours. Sepsis Labs: No results for input(s): PROCALCITON, LATICACIDVEN in the last 168 hours.  Recent Results (from the past 240 hour(s))  CULTURE, BLOOD (ROUTINE X 2) w Reflex to ID Panel     Status: None   Collection Time: 02/07/21  4:07 PM   Specimen: BLOOD  Result Value Ref Range Status   Specimen Description BLOOD BLOOD LEFT HAND  Final   Special Requests   Final    BOTTLES DRAWN AEROBIC AND ANAEROBIC Blood Culture adequate volume   Culture    Final    NO GROWTH 5 DAYS Performed at [facility], [address] [place], [place] [address]    Report Status 02/12/2021 FINAL  Final  CULTURE, BLOOD (ROUTINE X 2) w Reflex to ID Panel     Status: None   Collection Time: 02/07/21  5:11 PM   Specimen: BLOOD  Result Value Ref Range Status   Specimen Description BLOOD BLOOD LEFT HAND  Final   Special Requests   Final    BOTTLES DRAWN AEROBIC AND ANAEROBIC Blood Culture adequate volume   Culture   Final    NO GROWTH 5 DAYS Performed at [facility], [address], [place], [place] [address]    Report Status 02/12/2021 FINAL  Final  Urine Culture     Status: Abnormal   Collection Time: 02/12/21  1:15 PM  Specimen: Urine, Random  Result Value Ref Range Status   Specimen Description   Final    URINE, RANDOM Performed at [facility], [address] [place], [place] [address]    Special Requests   Final    NONE Performed at [facility], [place], [place] [address]    Culture 40,000 COLONIES/mL PROTEUS MIRABILIS (A)  Final   Report Status 02/15/2021 FINAL  Final   Organism ID, Bacteria PROTEUS MIRABILIS (A)  Final      Susceptibility   Proteus mirabilis - MIC*    AMPICILLIN <=2 SENSITIVE Sensitive     CEFAZOLIN <=4 SENSITIVE Sensitive     CEFEPIME <=0.12 SENSITIVE Sensitive     CEFTRIAXONE <=0.25 SENSITIVE Sensitive     CIPROFLOXACIN <=0.25 SENSITIVE Sensitive     GENTAMICIN <=1 SENSITIVE Sensitive     IMIPENEM 2 SENSITIVE Sensitive     NITROFURANTOIN 128 RESISTANT Resistant     TRIMETH/SULFA <=20 SENSITIVE Sensitive     AMPICILLIN/SULBACTAM <=2 SENSITIVE Sensitive     PIP/TAZO <=4 SENSITIVE Sensitive     * 40,000 COLONIES/mL PROTEUS MIRABILIS         Radiology Studies: No results found.      Scheduled Meds:  aspirin  81 mg Per Tube Daily   carvedilol  12.5 mg Per Tube BID WC   chlorhexidine gluconate (MEDLINE KIT)  15 mL Mouth Rinse BID    Chlorhexidine Gluconate Cloth  6 each Topical Q0600   diclofenac Sodium  2 g Topical QID   docusate  100 mg Per Tube BID   enoxaparin (LOVENOX) injection  0.5 mg/kg Subcutaneous Q24H   famotidine  20 mg Per Tube Daily   feeding supplement (NEPRO CARB STEADY)  237 mL Oral BID BM   free water  150 mL Per Tube Q4H   hydrALAZINE  50 mg Per Tube TID   insulin aspart  0-15 Units Subcutaneous TID AC & HS   losartan  50 mg Oral Daily   mouth rinse  15 mL Mouth Rinse 10 times per day   neomycin-bacitracin-polymyxin   Topical BID   polyethylene glycol  17 g Per Tube Daily   predniSONE  40 mg Per Tube Q breakfast   Continuous Infusions:  feeding supplement (NEPRO CARB STEADY) 1,000 mL (02/16/21 2028)     LOS: 54 days    Time spent: 24 minutes    Sharen Hones, MD [facility]   To contact the attending provider between 7A-7P or the covering provider during after hours 7P-7A, please log into the web site [email or web address] and access using universal [place] password for that web site. If you do not have the password, please call the hospital operator.  02/17/2021, 10:15 AM

## 2021-02-17 NOTE — Progress Notes (Signed)
PT Cancellation Note  Patient Details Name: Jeremy Browning MRN: [id number] DOB: [birth year]   Cancelled Treatment:    Reason Eval/Treat Not Completed: Other (comment). Pt assisted to roll and place bed pan at his request. CNA notified of pt status, PT to re-attempt as able.  Olga Coaster PT, DPT 1:54 PM,02/17/21

## 2021-02-17 NOTE — TOC Progression Note (Signed)
Transition of Care [facility]) - Progression Note    Patient Details  Name: Jeremy Browning MRN: [id number] Date of Birth: 1961/08/09  Transition of Care [facility]) CM/SW Contact  Spencer Cellar, RN Phone Number: 02/17/2021, 11:03 AM  Clinical Narrative:    Manually faxed paperwork to [facility] for review as no reply in the HUB.    Expected Discharge Plan: Skilled Nursing Facility Barriers to Discharge: Continued Medical Work up  Expected Discharge Plan and Services Expected Discharge Plan: Skilled Nursing Facility In-house Referral: Clinical Social Work   Post Acute Care Choice: Skilled Nursing Facility                                         Social Determinants of Health (SDOH) Interventions    Readmission Risk Interventions No flowsheet data found.

## 2021-02-17 NOTE — Progress Notes (Addendum)
Physical Therapy Treatment Patient Details Name: Jeremy Browning MRN: [id number] DOB: July 31, 1961 Today's Date: 02/17/2021    History of Present Illness Pt is a 60 yo male that presented from home with respiratory distress 12/25/2020. Underwent defibrillation and CPR, emergently intubated. During admission pt has had a cardiac cath, peg tube and tracheostomy placed, weaned from vent 5/26 AM. 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 Mildly restricted diffusion involving the cortex more diffusely over the right cerebral convexity favored to reflect an acute MCA territory infarct over global hypoxic injury given unilaterality".    PT Comments    Patient alert, motivated for therapy. Oriented to self, place, situation, unable to recall age. Able to follow 1-step commands with reorientation. Mood is lower than previous session and verbalizes anxiety with bowel movements and drooling that occurs with therapy. Encouragement provided for continued progress and pt is able to reorient to tasks at hand.   Progress with bed mobility requiring min-assist for supine <> sit. Assistance is needed for RLE management and bed rails. Pre-transfer activities initiated with forward weight shifting, min-guard. Sit<>stand x 3 with max assist x 2, pt able to clear bottom x 3, with one repetition of hip extension initiation. Skilled PT intervention is indicated to address deficits in function, mobility, and to return to PLOF as able.  Discharge recommendations remain CIR due to progression with mobility and cognition.    Follow Up Recommendations  CIR     Equipment Recommendations  None recommended by PT    Recommendations for Other Services Speech consult     Precautions / Restrictions Precautions Precautions: Fall Precaution Comments: PEG Restrictions Weight Bearing Restrictions: No    Mobility  Bed Mobility Overal bed mobility: Needs Assistance Bed  Mobility: Supine to Sit     Supine to sit: Min assist Sit to supine: Mod assist   General bed mobility comments: assistance for RLE, verbal and tactile cues for technique    Transfers       Sit to Stand: Max assist;+2 physical assistance         General transfer comment: sit <> stand x 3, task break down for anterior weight shift prior to standing initiation, able to clear bottom; slight hip extension x 1 with VCs  Ambulation/Gait                 Stairs             Wheelchair Mobility    Modified Rankin (Stroke Patients Only)       Balance Overall balance assessment: Needs assistance Sitting-balance support: Feet supported;Bilateral upper extremity supported Sitting balance-Leahy Scale: Fair Sitting balance - Comments: Supervision   Standing balance support: Bilateral upper extremity supported Standing balance-Leahy Scale: Zero Standing balance comment: unable to achieve full upright posture, balance not tested                            Cognition Arousal/Alertness: Awake/alert Behavior During Therapy: WFL for tasks assessed/performed Overall Cognitive Status: Within Functional Limits for tasks assessed Area of Impairment: Orientation;Rancho level               [place] Levels of Cognitive Functioning [place] Scales of Cognitive Functioning: Confused/appropriate Orientation Level: Disoriented to;Time                    Exercises General Exercises - Upper Extremity Shoulder Flexion: AAROM;10 reps;Seated;Left (utilizes scapular elevators,  AROM < 90 degrees) Elbow Flexion: AROM;15 reps;Seated;Left (Able to generate force for pushing) Elbow Extension: AROM;Left;15 reps;Seated (Min tricep activation) General Exercises - Lower Extremity Long Arc Quad: AROM;15 reps;Left;Seated (Good quad activation, AAROM for full knee ext) Other Exercises Other Exercises: L trunk side bending with elbow support to press-up Other  Exercises: Provided peri-care assitance with nursing    General Comments        Pertinent Vitals/Pain Pain Assessment: Faces Faces Pain Scale: No hurt    Home Living                      Prior Function            PT Goals (current goals can now be found in the care plan section) Acute Rehab PT Goals Patient Stated Goal: to get better, to move better PT Goal Formulation: With patient Time For Goal Achievement: 02/25/21 Potential to Achieve Goals: Good Progress towards PT goals: Progressing toward goals    Frequency    Min 2X/week      PT Plan Current plan remains appropriate    Co-evaluation              AM-PAC PT "6 Clicks" Mobility   Outcome Measure  Help needed turning from your back to your side while in a flat bed without using bedrails?: A Lot Help needed moving from lying on your back to sitting on the side of a flat bed without using bedrails?: A Lot Help needed moving to and from a bed to a chair (including a wheelchair)?: A Lot Help needed standing up from a chair using your arms (e.g., wheelchair or bedside chair)?: A Lot Help needed to walk in hospital room?: Total Help needed climbing 3-5 steps with a railing? : Total 6 Click Score: 10    End of Session Equipment Utilized During Treatment: Gait belt Activity Tolerance: Patient tolerated treatment well Patient left: in bed;with call bell/phone within reach;with bed alarm set;with nursing/sitter in room Nurse Communication: Mobility status PT Visit Diagnosis: Other abnormalities of gait and mobility (R26.89);Muscle weakness (generalized) (M62.81);Other symptoms and signs involving the nervous system (B35.329)     Time: 1434-     Charges:                        Hosie Spangle, SPT

## 2021-02-17 NOTE — Progress Notes (Signed)
VAST consulted to obtain IV access. Pt currently has no IV meds/fluids/studies ordered. Sent SecureChat to patient's nurse, Irving Burton; advised best practice is to allow for vein preservation and place IV when patient has current need for it (not placing "just in case" IV's). Irving Burton, RN contacted physician who verbalized it was okay to leave patient without IV access at this time.

## 2021-02-17 NOTE — Progress Notes (Signed)
Calorie Count Note  48-hour calorie count ordered.  Diet: Dysphagia 1/Thin Liquids Supplements: Nepro shakes BID, nocturnal TF  Lunch: 6/29 - not documented Dinner: 6/29 - not documented Breakfast: 6/30 - not documented Supplements: not documented  Nursing staff did not document exact amounts for calorie count. RD messaged RN staff to remind them of specifics for calorie count instructions. Recorded "bites and sips" in the Flowsheets.  Total intake: unknown kcal (unknown % of minimum estimated needs)  unknown protein (unknown % of minimum estimated needs)  Nutrition Dx: Inadequate oral intake related to inability to eat (pt sedated and ventilated) as evidenced by NPO status. - resolved  Goal: Patient will meet greater than or equal to 90% of their needs - previous met with tube feeds  Interventions:  Continue current diet as ordered. Advance as able per SLP Continue 48-hour calorie count to determine amount of kcal and protein being consumed throughout the day at meals Nepro Shake po BID, each supplement provides 425 kcal and 19 grams protein Continue nocturnal feeds of Nepro 1.8 to 70 ml/hr x 12 hours (2000-0800) for total volume of 840 mL Free water flushes to 1106m q4 hours Regimen provides 1487 kcal/day, 68 g/day protein and 1510 mL/day free water  MDerrel Nip RD, LDN (she/her/hers) Registered Dietitian I After-Hours/Weekend Pager # [place]

## 2021-02-18 DIAGNOSIS — J690 Pneumonitis due to inhalation of food and vomit: Secondary | ICD-10-CM | POA: Diagnosis not present

## 2021-02-18 DIAGNOSIS — I469 Cardiac arrest, cause unspecified: Secondary | ICD-10-CM | POA: Diagnosis not present

## 2021-02-18 DIAGNOSIS — I5033 Acute on chronic diastolic (congestive) heart failure: Secondary | ICD-10-CM | POA: Diagnosis not present

## 2021-02-18 DIAGNOSIS — J9601 Acute respiratory failure with hypoxia: Secondary | ICD-10-CM | POA: Diagnosis not present

## 2021-02-18 LAB — GLUCOSE, CAPILLARY
Glucose-Capillary: 120 mg/dL — ABNORMAL HIGH (ref 70–99)
Glucose-Capillary: 173 mg/dL — ABNORMAL HIGH (ref 70–99)
Glucose-Capillary: 184 mg/dL — ABNORMAL HIGH (ref 70–99)
Glucose-Capillary: 213 mg/dL — ABNORMAL HIGH (ref 70–99)
Glucose-Capillary: 146 mg/dL — ABNORMAL HIGH (ref 70–99)

## 2021-02-18 LAB — BASIC METABOLIC PANEL
Anion gap: 6 (ref 5–15)
BUN: 39 mg/dL — ABNORMAL HIGH (ref 8–23)
CO2: 25 mmol/L (ref 22–32)
Calcium: 8.7 mg/dL — ABNORMAL LOW (ref 8.9–10.3)
Chloride: 105 mmol/L (ref 98–111)
Creatinine, Ser: 1.06 mg/dL (ref 0.61–1.24)
GFR, Estimated: >60 mL/min (ref >60)
Glucose, Bld: 118 mg/dL — ABNORMAL HIGH (ref 70–99)
Potassium: 4.2 mmol/L (ref 3.5–5.1)
Sodium: 136 mmol/L (ref 135–145)

## 2021-02-18 LAB — CBC
HCT: 31.6 % — ABNORMAL LOW (ref 39.0–52.0)
Hemoglobin: 9.8 g/dL — ABNORMAL LOW (ref 13.0–17.0)
MCH: 24.9 pg — ABNORMAL LOW (ref 26.0–34.0)
MCHC: 31 g/dL (ref 30.0–36.0)
MCV: 80.4 fL (ref 80.0–100.0)
Platelets: 176 10*3/uL (ref 150–400)
RBC: 3.93 MIL/uL — ABNORMAL LOW (ref 4.22–5.81)
RDW: 16.8 % — ABNORMAL HIGH (ref 11.5–15.5)
WBC: 15.3 10*3/uL — ABNORMAL HIGH (ref 4.0–10.5)
nRBC: 0 % (ref 0.0–0.2)

## 2021-02-18 MED ORDER — ORAL CARE MOUTH RINSE
15.0000 mL | Freq: Two times a day (BID) | OROMUCOSAL | Status: DC
  Administered 2021-02-18 – 2021-03-08 (×29): 15 mL via OROMUCOSAL

## 2021-02-18 MED ORDER — NEPRO/CARBSTEADY PO LIQD
1000.0000 mL | ORAL | Status: DC
  Administered 2021-02-18 – 2021-02-20 (×3): 1000 mL

## 2021-02-18 MED ORDER — ENSURE MAX PROTEIN PO LIQD
11.0000 [oz_av] | Freq: Two times a day (BID) | ORAL | Status: DC
  Administered 2021-02-18 – 2021-03-01 (×22): 11 [oz_av] via ORAL
  Filled 2021-02-18: qty 330

## 2021-02-18 MED ORDER — PROSOURCE TF PO LIQD
90.0000 mL | Freq: Every day | ORAL | Status: DC
  Administered 2021-02-18 – 2021-02-28 (×11): 90 mL
  Filled 2021-02-18 (×12): qty 90

## 2021-02-18 NOTE — Progress Notes (Signed)
Physical Therapy Treatment Patient Details Name: Jeremy Browning MRN: [id number] DOB: 10/22/1960 Today's Date: 02/18/2021    History of Present Illness Pt is a 60 yo male that presented from home with respiratory distress 12/25/2020. Underwent defibrillation and CPR, emergently intubated. During admission pt has had a cardiac cath, peg tube and tracheostomy placed, weaned from vent 5/26 AM. 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 Mildly restricted diffusion involving the cortex more diffusely over the right cerebral convexity favored to reflect an acute MCA territory infarct over global hypoxic injury given unilaterality".    PT Comments    Pt alert, in bed, cooperative with PT. Pt stated his goal was to work on "standing and walking," and continues to be motivated for therapy. Supine>sit with min-assist for LLE management, pt able to raise trunk without assistance. Pt demonstrated ability to laterally scoot at EOB with tactile and verbal cues with min assist. Sit to stand require max assist x 2, progressed activity by increasing number of repetitions to increase intensity necessary for improved neural plasticity. Pt indicates 5/10 on modified Borg scale during activities. Skilled PT intervention is indicated to address deficits in function, mobility, and to return to PLOF as able. Discharge recommendations remain CIR.   Follow Up Recommendations  CIR     Equipment Recommendations  None recommended by PT    Recommendations for Other Services       Precautions / Restrictions Precautions Precautions: Fall Precaution Comments: PEG Restrictions Weight Bearing Restrictions: No    Mobility  Bed Mobility Overal bed mobility: Needs Assistance Bed Mobility: Supine to Sit     Supine to sit: Min assist     General bed mobility comments: assistance for RLE, verbal and tactile cues for technique    Transfers Overall transfer level: Needs  assistance Equipment used: Sliding board Transfers: Sit to/from Stand;Lateral/Scoot Transfers Sit to Stand: Max assist;+2 physical assistance        Lateral/Scoot Transfers: Mod assist General transfer comment: sit <> stand x 4, task break down for anterior weight shift prior to standing initiation, able to clear bottom;  Ambulation/Gait                 Stairs             Wheelchair Mobility    Modified Rankin (Stroke Patients Only)       Balance Overall balance assessment: Needs assistance Sitting-balance support: Feet supported;Bilateral upper extremity supported Sitting balance-Leahy Scale: Fair Sitting balance - Comments: Supervision   Standing balance support: Bilateral upper extremity supported Standing balance-Leahy Scale: Zero Standing balance comment: unable to achieve full upright posture                            Cognition Arousal/Alertness: Awake/alert Behavior During Therapy: WFL for tasks assessed/performed Overall Cognitive Status: Within Functional Limits for tasks assessed Area of Impairment: Orientation;[place]o level                               General Comments: Oriented to day of the week      Exercises Other Exercises Other Exercises: Neuromuscular re-education anterior weight shifting, BLE grounding    General Comments        Pertinent Vitals/Pain Pain Assessment: No/denies pain    Home Living  Prior Function            PT Goals (current goals can now be found in the care plan section) Acute Rehab PT Goals Patient Stated Goal: to get better, to move better PT Goal Formulation: With patient Time For Goal Achievement: 02/25/21 Potential to Achieve Goals: Good Progress towards PT goals: Progressing toward goals    Frequency    Min 2X/week      PT Plan Current plan remains appropriate    Co-evaluation              AM-PAC PT "6 Clicks" Mobility    Outcome Measure  Help needed turning from your back to your side while in a flat bed without using bedrails?: A Lot Help needed moving from lying on your back to sitting on the side of a flat bed without using bedrails?: A Lot Help needed moving to and from a bed to a chair (including a wheelchair)?: A Lot Help needed standing up from a chair using your arms (e.g., wheelchair or bedside chair)?: A Lot Help needed to walk in hospital room?: Total Help needed climbing 3-5 steps with a railing? : Total 6 Click Score: 10    End of Session Equipment Utilized During Treatment: Gait belt Activity Tolerance: Patient tolerated treatment well Patient left: in chair;with call bell/phone within reach;with chair alarm set Nurse Communication: Mobility status PT Visit Diagnosis: Other abnormalities of gait and mobility (R26.89);Muscle weakness (generalized) (M62.81);Other symptoms and signs involving the nervous system (R29.898)     Time: 7628-3151 PT Time Calculation (min) (ACUTE ONLY): 54 min  Charges:                        [employer], SPT

## 2021-02-18 NOTE — Progress Notes (Signed)
Calorie Count Note  48-hour calorie count ordered. Day 2 results  Diet: DYS 1, thin liquids Supplements: Nepro BID each supplement provides 425 kcal and 19 grams protein  Limited meal tickets available to analyze again today, only lunch from 6/30 in pt's envelope. Noted pt was assisted with breakfast by SLP. Able to gain good insight from SLP who reports that 100% of food items and drinks on pt's tray were consumed. Reports additional assistance is needed for pt with meals which was communicated to RN this AM. Dining services documented that 30% of dinner was consumed. Estimated intake based on total nutrition provided by pt's dinner order. Total nutrition pt consumed from previous three meals is meeting ~50% of estimated kcal needs and ~30% of protein needs. Unsure if pt is consuming nutrition supplements. Reached out to RN - awaiting response.  TF was adjusted on 6/29 to meet 50% of pt's needs. Based on current intake, would recommend continuing nocturnal feeds. However, could lower duration slightly to 10 hours versus 12. Will also add 2 packets of prosource daily to ensure protein needs are being met. Will also adjust oral supplements to a higher protein containing product.   Estimated Nutritional Needs:  Kcal:  2500-2800 kcal/d Protein:  125-140g/day Fluid:  2.4-2.7L/day  6/30 Lunch: 300 kcal, 3 g of protein 6/30 Dinner: 30% per dining staff, unsure of exact foods eaten, unable to calculate kcal and protein consumed. Based on 30% of total kcal from meal, estimated 300 kcal and 13g of protein 7/1 Breakfast: 841 kcal and 26 g of protein  Supplements: unknown, consumption not documented.  Total estimated intake: 1441 kcal (58% of minimum estimated needs)  42g protein (34% of minimum estimated needs)  Goal: Patient will meet greater than or equal to 90% of their needs   Intervention:  Continue current diet as ordered. Advance as able per SLP. Nursing to assist with meal set up and  feeding if needed per SLP recommendations.  Adjust supplements to Ensure Max po BID as pt's protein intake is lacking with meals. Each supplement provides 150 kcal and 30 grams of protein.  Continue nocturnal feeds of Nepro 1.8 to 70 ml/hr x 10 hours (2000-0600) for total volume of 700 mL Free water flushes to 179m q4 hours Prosource TF 951m(2 packets) daily to provide an additional 80kcal and 22g of protein Regimen provides 1319 kcal/day, 77 g/day protein and 1409 mL/day free water  RaRanell PatrickRD, LDN Clinical Dietitian Pager on Amion

## 2021-02-18 NOTE — Evaluation (Signed)
Occupational Therapy RE- Evaluation Patient Details Name: Jeremy Browning MRN: [id number] DOB: [birth year] Today's Date: 02/18/2021    History of Present Illness Pt is a 60 yo male that presented from home with respiratory distress 12/25/2020. Underwent defibrillation and CPR, emergently intubated. During admission pt has had a cardiac cath, peg tube and tracheostomy placed, weaned from vent 5/26 AM. 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 Mildly restricted diffusion involving the cortex more diffusely over the right cerebral convexity favored to reflect an acute MCA territory infarct over global hypoxic injury given unilaterality".   Clinical Impression   Pt seen for re-evaluation this session with pt making significant progress towards occupational therapy goals. Pt has progressed to ranchos VI. Pt agreeable to OT intervention. Session focused on L NMR, self care retaining, cognition, and L attention. Pt performed bed mobility to EOB with min guard and increased time. Pt sitting EOB with supervision during self care tasks. Self care items introduced to pt from L side to increase visual scanning to L to locate. Pt needing hand over hand assistance to utilize L UE to wash L LE and R UE . Pt needing min cuing for sequencing and initiation to wash all body part. Pt returning to supine with min guard and pt noted to have BM. Pt rolling self in bed with min A and therapist assisting pt with hygiene. Pt repositioned in bed for comfort and very cooperative during session. Goals updates to reflect pt's current progress. All needs within reach and bed alarm activated. Pt continues to benefit from OT intervention.       Follow Up Recommendations  SNF;Supervision/Assistance - 24 hour    Equipment Recommendations  Other (comment) (defer to next venue of care)       Precautions / Restrictions Precautions Precautions: Fall Precaution Comments:  PEG Restrictions Weight Bearing Restrictions: No      Mobility Bed Mobility Overal bed mobility: Needs Assistance Bed Mobility: Supine to Sit;Sit to Supine     Supine to sit: Min assist Sit to supine: Min assist   General bed mobility comments: assistance for RLE, verbal and tactile cues for technique    Transfers Overall transfer level: Needs assistance Equipment used: Sliding board Transfers: Sit to/from Stand;Lateral/Scoot Transfers Sit to Stand: Max assist;+2 physical assistance        Lateral/Scoot Transfers: Min assist General transfer comment: sit <> stand x 4, task break down for anterior weight shift prior to standing initiation, able to clear bottom;    Balance Overall balance assessment: Needs assistance Sitting-balance support: Feet supported;Bilateral upper extremity supported Sitting balance-Leahy Scale: Fair Sitting balance - Comments: Supervision   Standing balance support: Bilateral upper extremity supported Standing balance-Leahy Scale: Zero Standing balance comment: unable to achieve full upright posture                           ADL either performed or assessed with clinical judgement   ADL Overall ADL's : Needs assistance/impaired     Grooming: Wash/dry face;Set up;Oral care;Sitting   Upper Body Bathing: Minimal assistance;Sitting   Lower Body Bathing: Moderate assistance Lower Body Bathing Details (indicate cue type and reason): Pt washing B LEs but needing assistance for feet and returning to bed for assistance to wash buttocks Upper Body Dressing : Minimal assistance Upper Body Dressing Details (indicate cue type and reason): to don hospital gown  Vision Baseline Vision/History: No visual deficits Patient Visual Report: No change from baseline              Pertinent Vitals/Pain Pain Assessment: No/denies pain    Communication     Cognition Arousal/Alertness: Awake/alert Behavior  During Therapy: WFL for tasks assessed/performed Overall Cognitive Status: Impaired/Different from baseline Area of Impairment: Rancho level;Following commands;Safety/judgement               Rancho Levels of Cognitive Functioning Ranch[place] Amigos Scales of Cognitive Functioning: Confused/appropriate Orientation Level: Disoriented to;Time     Following Commands: Follows one step commands consistently;Follows one step commands with increased time Safety/Judgement: Decreased awareness of deficits;Decreased awareness of safety     General Comments: Pt needing min cuing for sequencing with self care tasks   General Comments       Exercises Other Exercises Other Exercises: Neuromuscular re-education anterior weight shifting, BLE grounding    OT Goals(Current goals can be found in the care plan section) Acute Rehab OT Goals Patient Stated Goal: to get better, to move better OT Goal Formulation: With patient Time For Goal Achievement: 03/04/21 Potential to Achieve Goals: Fair ADL Goals Pt Will Perform Grooming: sitting;with modified independence Pt Will Perform Upper Body Dressing: with supervision;sitting  OT Frequency: Min 2X/week    AM-PAC OT "6 Clicks" Daily Activity     Outcome Measure Help from another person eating meals?: A Little Help from another person taking care of personal grooming?: A Little Help from another person toileting, which includes using toliet, bedpan, or urinal?: Total Help from another person bathing (including washing, rinsing, drying)?: A Lot Help from another person to put on and taking off regular upper body clothing?: A Little Help from another person to put on and taking off regular lower body clothing?: A Lot 6 Click Score: 14   End of Session Nurse Communication: Mobility status  Activity Tolerance: Patient tolerated treatment well Patient left: with call bell/phone within reach;with bed alarm set;in bed  OT Visit Diagnosis: Other  symptoms and signs involving the nervous system (R29.898);Other symptoms and signs involving cognitive function                Time: 1018-1100 OT Time Calculation (min): 42 min Charges:  OT General Charges $OT Visit: 1 Visit OT Evaluation $OT Re-eval: 1 Re-eval OT Treatments $Self Care/Home Management : 38-52 mins  Jackquline Denmark, MS, OTR/L , CBIS ascom [phone]  02/18/21, 4:33 PM

## 2021-02-18 NOTE — Progress Notes (Signed)
PROGRESS NOTE    Jeremy Browning  FBP:[id number] DOB: 30-May-1961 DOA: 12/25/2020 PCP: No primary care provider on file.    Brief Narrative:  Jeremy Browning is an 60 y.o. male with obesity, DM2, HTN, combined CHF who was admitted on 12/25/2020 with respiratory distress.  He was intubated in the ER also had VT requiring epi/cardioversion.  Hospital course complicated by persistent vent dependent respiratory failure eventually requiring tracheostomy, anoxic encephalopathy. Patient had coronary angiogram performed 5/7, reviewed and normal coronary arteries.  Echocardiogram showed ejection fraction 45 to 50% with grade 1 diastolic dysfunction.   Patient since has improved.  [name] is removed.  Patient is able to start eating, also receiving tube feeding.  Currently he is pending SNF placement.   Assessment & Plan:   Active Problems:   Cardiogenic shock (HCC)   Cardiac arrest with successful resuscitation ([place])   Flash pulmonary edema ([place])   Cardiopulmonary arrest with successful resuscitation ([place])   Acute respiratory failure with hypoxia and hypercapnia (HCC)   Acute renal failure (HCC)   Transaminitis   Second degree heart block   Lactic acidosis   Aspiration pneumonia (HCC)   HFrEF (heart failure with reduced ejection fraction) (HCC)   Severe hypoxic-ischemic encephalopathy   SOB (shortness of breath)   Oropharyngeal dysphagia   Pressure injury of skin   Right knee DJD   Essential hypertension   Acute on chronic diastolic CHF (congestive heart failure) ([place])   Proteus pneumonia ([place])  Patient is eating well during the daytime, receiving nocturnal tube feeding.  No need to change treatment plan today.  Continue PT OT in the hospital.  Currently no bed offer for SNF.    DVT prophylaxis: Lovenox Code Status: DNR Family Communication:  Disposition Plan:    Status is: Inpatient  Remains inpatient appropriate because:Unsafe d/c plan  Dispo: The patient is from: Home               Anticipated d/c is to: SNF              Patient currently is medically stable to d/c.   Difficult to place patient Yes        I/O last 3 completed shifts: In: 760 [P.O.:760] Out: 7092 [Urine:1725] No intake/output data recorded.     Consultants:  None  Procedures: None  Antimicrobials: None   Subjective: Patient doing well, appetite is fair during daytime. Denies any chest pain or palpitation. No abdominal pain nausea vomiting.  He had a large bowel movement yesterday. No fever chills   Objective: Vitals:   02/17/21 1547 02/17/21 1712 02/17/21 2001 02/18/21 0414  BP: (!) 135/46  (!) 157/59 (!) 143/55  Pulse: (!) 59 (!) 55 (!) 53 61  Resp: _0 Temp: 98.5 F (36.9 C)  97.8 F (36.6 C) 98.3 F (36.8 C)  TempSrc: Oral  Oral Oral  SpO2: 100%  100% 100%  Weight:    112.7 kg  Height:        Intake/Output Summary (Last 24 hours) at 02/18/2021 1052 Last data filed at 02/18/2021 0414 Gross per 24 hour  Intake 640 ml  Output 1725 ml  Net -1085 ml   Filed Weights   02/15/21 0500 02/16/21 0500 02/18/21 0414  Weight: 110.2 kg 114.6 kg 112.7 kg    Examination:  General exam: Appears calm and comfortable  Respiratory system: Clear to auscultation. Respiratory effort normal. Cardiovascular system: S1 & S2 heard, RRR. No JVD, murmurs, rubs, gallops or clicks. No  pedal edema. Gastrointestinal system: Abdomen is nondistended, soft and nontender. No organomegaly or masses felt. Normal bowel sounds heard. Central nervous system: Alert and oriented. No focal neurological deficits. Extremities: Symmetric 5 x 5 power. Skin: No rashes, lesions or ulcers Psychiatry: Judgement and insight appear normal. Mood & affect appropriate.     Data Reviewed: I have personally reviewed following labs and imaging studies  CBC: Recent Labs  Lab 02/12/21 0414 02/14/21 0644 02/18/21 0458  WBC 14.0* 14.3* 15.3*  HGB 10.7* 10.8* 9.8*  HCT 34.7* 34.8* 31.6*  MCV 81.5 80.9  80.4  PLT 293 240 680   Basic Metabolic Panel: Recent Labs  Lab 02/12/21 0414 02/14/21 0644 02/16/21 1115 02/18/21 0458  NA 139 136 136 136  K 4.0 3.9 4.1 4.2  CL 106 106 103 105  CO2 _0 GLUCOSE 184* 166* 201* 118*  BUN 52* 46* 44* 39*  CREATININE 1.26* 1.13 1.12 1.06  CALCIUM 8.9 8.6* 8.9 8.7*  MG 2.4 2.2 2.1  --   PHOS 3.0 3.0 3.3  --    GFR: Estimated Creatinine Clearance: 94.8 mL/min (by C-G formula based on SCr of 1.06 mg/dL). Liver Function Tests: Recent Labs  Lab 02/12/21 0414 02/14/21 0644  AST 54* 85*  ALT 235* 227*  ALKPHOS 95 88  BILITOT 0.4 0.4  PROT 7.8 7.6  ALBUMIN 2.8* 2.8*   No results for input(s): LIPASE, AMYLASE in the last 168 hours. No results for input(s): AMMONIA in the last 168 hours. Coagulation Profile: No results for input(s): INR, PROTIME in the last 168 hours. Cardiac Enzymes: No results for input(s): CKTOTAL, CKMB, CKMBINDEX, TROPONINI in the last 168 hours. BNP (last 3 results) No results for input(s): PROBNP in the last 8760 hours. HbA1C: No results for input(s): HGBA1C in the last 72 hours. CBG: Recent Labs  Lab 02/17/21 1622 02/17/21 2004 02/17/21 2356 02/18/21 0415 02/18/21 0951  GLUCAP 256* 165* 104* 120* 173*   Lipid Profile: No results for input(s): CHOL, HDL, LDLCALC, TRIG, CHOLHDL, LDLDIRECT in the last 72 hours. Thyroid Function Tests: No results for input(s): TSH, T4TOTAL, FREET4, T3FREE, THYROIDAB in the last 72 hours. Anemia Panel: No results for input(s): VITAMINB12, FOLATE, FERRITIN, TIBC, IRON, RETICCTPCT in the last 72 hours. Sepsis Labs: No results for input(s): PROCALCITON, LATICACIDVEN in the last 168 hours.  Recent Results (from the past 240 hour(s))  Urine Culture     Status: Abnormal   Collection Time: 02/12/21  1:15 PM   Specimen: Urine, Random  Result Value Ref Range Status   Specimen Description   Final    URINE, RANDOM Performed at [facility] [address]  [place], [place] [address]    Special Requests   Final    NONE Performed at [facility], [place], [place] [address]    Culture 40,000 COLONIES/mL PROTEUS MIRABILIS (A)  Final   Report Status 02/15/2021 FINAL  Final   Organism ID, Bacteria PROTEUS MIRABILIS (A)  Final      Susceptibility   Proteus mirabilis - MIC*    AMPICILLIN <=2 SENSITIVE Sensitive     CEFAZOLIN <=4 SENSITIVE Sensitive     CEFEPIME <=0.12 SENSITIVE Sensitive     CEFTRIAXONE <=0.25 SENSITIVE Sensitive     CIPROFLOXACIN <=0.25 SENSITIVE Sensitive     GENTAMICIN <=1 SENSITIVE Sensitive     IMIPENEM 2 SENSITIVE Sensitive     NITROFURANTOIN 128 RESISTANT Resistant     TRIMETH/SULFA <=20 SENSITIVE Sensitive  AMPICILLIN/SULBACTAM <=2 SENSITIVE Sensitive     PIP/TAZO <=4 SENSITIVE Sensitive     * 40,000 COLONIES/mL PROTEUS MIRABILIS         Radiology Studies: No results found.      Scheduled Meds:  aspirin  81 mg Per Tube Daily   carvedilol  12.5 mg Per Tube BID WC   chlorhexidine gluconate (MEDLINE KIT)  15 mL Mouth Rinse BID   Chlorhexidine Gluconate Cloth  6 each Topical Q0600   diclofenac Sodium  2 g Topical QID   docusate  100 mg Per Tube BID   enoxaparin (LOVENOX) injection  0.5 mg/kg Subcutaneous Q24H   famotidine  20 mg Per Tube Daily   feeding supplement (NEPRO CARB STEADY)  237 mL Oral BID BM   free water  150 mL Per Tube Q4H   hydrALAZINE  50 mg Per Tube TID   insulin aspart  0-15 Units Subcutaneous TID AC & HS   losartan  50 mg Oral Daily   mouth rinse  15 mL Mouth Rinse 10 times per day   neomycin-bacitracin-polymyxin   Topical BID   polyethylene glycol  17 g Per Tube Daily   predniSONE  40 mg Per Tube Q breakfast   Continuous Infusions:  feeding supplement (NEPRO CARB STEADY) 1,000 mL (02/17/21 2000)     LOS: 55 days    Time spent: 24 minutes    Sharen Hones, MD [facility]   To contact the attending provider between 7A-7P or the covering  provider during after hours 7P-7A, please log into the web site [email or web address] and access using universal [place] password for that web site. If you do not have the password, please call the hospital operator.  02/18/2021, 10:52 AM

## 2021-02-18 NOTE — Progress Notes (Signed)
  Speech Language Pathology Treatment: Dysphagia  Patient Details Name: Jeremy Browning MRN: [id number] DOB: 08-Oct-1960 Today's Date: 02/18/2021 Time: 5643-3295 SLP Time Calculation (min) (ACUTE ONLY): 25 min  Assessment / Plan / Recommendation Clinical Impression  Pt seen for ongoing dysphagia management. Skilled observation provided of pt consuming dysphagia 1 breakfast, graham cracker and thin liquids via straw. When consuming graham cracker, pt displayed prolonged mastication and he appeared to have more difficulty manipulating bolus. When consuming dysphagia 1 pt demonstrated left anterior spillage and mild to moderate left buccal residue. With minimal cues and liquid wash, all oral residue was cleared. When consuming thin liquids via straw, pt was free of overt s/s of aspiration.   Of note, pt feed himself all items to the right of midline. He will require assistance with turning his plate to visualize items remaining on the left of his plate. Instructions sent via secure chat to pt's nurse and information documented in pt's treatment team notes as well as acute rehab sticky notes.  Nurse also made aware of calorie count.     HPI HPI: Pt is a 60 yo male that presented from home with respiratory distress 12/25/2020. Underwent defibrillation and CPR, emergently intubated. During admission pt has had a cardiac cath, peg tube and tracheostomy placed, weaned from vent 01/13/2021. 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 Mildly restricted diffusion involving the cortex more diffusely over the right cerebral convexity favored to reflect an acute MCA territory infarct over global hypoxic injury given unilaterality".  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 Pt currently has a #6 cuffless Shiley with PMSV worn during all  waking hours.      SLP Plan  Continue with current plan of care       Recommendations  Diet recommendations: Dysphagia 1 (puree);Thin liquid Liquids provided via: Straw Medication Administration: Crushed with puree Supervision: Staff to assist with self feeding;Full supervision/cueing for compensatory strategies Compensations: Minimize environmental distractions;Slow rate;Small sips/bites;Lingual sweep for clearance of pocketing Postural Changes and/or Swallow Maneuvers: Seated upright 90 degrees;Upright 30-60 min after meal                Oral Care Recommendations: Oral care BID Follow up Recommendations: Skilled Nursing facility SLP Visit Diagnosis: Dysphagia, oropharyngeal phase (R13.12);Cognitive communication deficit (R41.841) Plan: Continue with current plan of care       GO               Theressa Piedra B. Dreama Saa M.S., CCC-SLP, [facility] Speech-Language Pathologist Rehabilitation Services Office [phone]  Taydem Cavagnaro Dreama Saa 02/18/2021, 11:12 AM

## 2021-02-19 DIAGNOSIS — I469 Cardiac arrest, cause unspecified: Secondary | ICD-10-CM | POA: Diagnosis not present

## 2021-02-19 DIAGNOSIS — I5033 Acute on chronic diastolic (congestive) heart failure: Secondary | ICD-10-CM | POA: Diagnosis not present

## 2021-02-19 DIAGNOSIS — J690 Pneumonitis due to inhalation of food and vomit: Secondary | ICD-10-CM | POA: Diagnosis not present

## 2021-02-19 DIAGNOSIS — J9601 Acute respiratory failure with hypoxia: Secondary | ICD-10-CM | POA: Diagnosis not present

## 2021-02-19 LAB — GLUCOSE, CAPILLARY
Glucose-Capillary: 123 mg/dL — ABNORMAL HIGH (ref 70–99)
Glucose-Capillary: 141 mg/dL — ABNORMAL HIGH (ref 70–99)
Glucose-Capillary: 148 mg/dL — ABNORMAL HIGH (ref 70–99)
Glucose-Capillary: 151 mg/dL — ABNORMAL HIGH (ref 70–99)
Glucose-Capillary: 162 mg/dL — ABNORMAL HIGH (ref 70–99)
Glucose-Capillary: 171 mg/dL — ABNORMAL HIGH (ref 70–99)
Glucose-Capillary: 174 mg/dL — ABNORMAL HIGH (ref 70–99)

## 2021-02-19 NOTE — Progress Notes (Signed)
PROGRESS NOTE    Jeremy Browning  ZOX:[id number] DOB: 06/29/1961 DOA: 12/25/2020 PCP: No primary care provider on file.    Brief Narrative:  Jeremy Browning is an 60 y.o. male with obesity, DM2, HTN, combined CHF who was admitted on 12/25/2020 with respiratory distress.  He was intubated in the ER also had VT requiring epi/cardioversion.  Hospital course complicated by persistent vent dependent respiratory failure eventually requiring tracheostomy, anoxic encephalopathy. Patient had coronary angiogram performed 5/7, reviewed and normal coronary arteries.  Echocardiogram showed ejection fraction 45 to 50% with grade 1 diastolic dysfunction.   Patient since has improved.  [name] is removed.  Patient is able to start eating, also receiving tube feeding.  Currently he is pending SNF placement.   Assessment & Plan:   Active Problems:   Cardiogenic shock (HCC)   Cardiac arrest with successful resuscitation (HCC)   Flash pulmonary edema (HCC)   Cardiopulmonary arrest with successful resuscitation (HCC)   Acute respiratory failure with hypoxia and hypercapnia (HCC)   Acute renal failure (HCC)   Transaminitis   Second degree heart block   Lactic acidosis   Aspiration pneumonia (HCC)   HFrEF (heart failure with reduced ejection fraction) (HCC)   Severe hypoxic-ischemic encephalopathy   SOB (shortness of breath)   Oropharyngeal dysphagia   Pressure injury of skin   Right knee DJD   Essential hypertension   Acute on chronic diastolic CHF (congestive heart failure) (HCC)   Proteus pneumonia (HCC)  Patient condition stable.  He has a better p.o. intake, nocturnal feeding reduced. He is actively participating in PT/OT in the hospital.  No bed offer in SNF.    DVT prophylaxis: Lovenox Code Status: DNR Family Communication:  Disposition Plan:    Status is: Inpatient  Remains inpatient appropriate because:Unsafe d/c plan  Dispo: The patient is from: Home              Anticipated d/c is  to: SNF              Patient currently is medically stable to d/c.   Difficult to place patient Yes        I/O last 3 completed shifts: In: 600 [P.O.:480; Other:120] Out: 3900 [Urine:3900] Total I/O In: 240 [P.O.:240] Out: -      Consultants:  None  Procedures: None  Antimicrobials: None   Subjective: Patient doing better today, better appetite, no nausea vomiting. No fever chills  No dysuria hematuria No shortness of breath or cough.   Objective: Vitals:   02/18/21 1957 02/19/21 0549 02/19/21 0824 02/19/21 1143  BP: (!) 148/65 (!) 117/48 98/72 (!) 115/50  Pulse: 68 65 (!) 58 (!) 51  Resp: 20 20 16 16   Temp: 98.6 F (37 C) 98.5 F (36.9 C) 98.1 F (36.7 C) 98.3 F (36.8 C)  TempSrc: Oral Oral    SpO2: 100% 100% 100% 100%  Weight:  113.4 kg    Height:        Intake/Output Summary (Last 24 hours) at 02/19/2021 1329 Last data filed at 02/19/2021 0945 Gross per 24 hour  Intake 600 ml  Output 1400 ml  Net -800 ml   Filed Weights   02/16/21 0500 02/18/21 0414 02/19/21 0549  Weight: 114.6 kg 112.7 kg 113.4 kg    Examination:  General exam: Appears calm and comfortable  Respiratory system: Clear to auscultation. Respiratory effort normal. Cardiovascular system: S1 & S2 heard, RRR. No JVD, murmurs, rubs, gallops or clicks. No pedal edema. Gastrointestinal system: Abdomen is  nondistended, soft and nontender. No organomegaly or masses felt. Normal bowel sounds heard. Central nervous system: Alert and oriented. No focal neurological deficits. Extremities: Symmetric 5 x 5 power. Skin: No rashes, lesions or ulcers Psychiatry: Judgement and insight appear normal. Mood & affect appropriate.     Data Reviewed: I have personally reviewed following labs and imaging studies  CBC: Recent Labs  Lab 02/14/21 0644 02/18/21 0458  WBC 14.3* 15.3*  HGB 10.8* 9.8*  HCT 34.8* 31.6*  MCV 80.9 80.4  PLT 240 176   Basic Metabolic Panel: Recent Labs  Lab  02/14/21 0644 02/16/21 1115 02/18/21 0458  NA 136 136 136  K 3.9 4.1 4.2  CL 106 103 105  CO2 25 24 25   GLUCOSE 166* 201* 118*  BUN 46* 44* 39*  CREATININE 1.13 1.12 1.06  CALCIUM 8.6* 8.9 8.7*  MG 2.2 2.1  --   PHOS 3.0 3.3  --    GFR: Estimated Creatinine Clearance: 95.1 mL/min (by C-G formula based on SCr of 1.06 mg/dL). Liver Function Tests: Recent Labs  Lab 02/14/21 0644  AST 85*  ALT 227*  ALKPHOS 88  BILITOT 0.4  PROT 7.6  ALBUMIN 2.8*   No results for input(s): LIPASE, AMYLASE in the last 168 hours. No results for input(s): AMMONIA in the last 168 hours. Coagulation Profile: No results for input(s): INR, PROTIME in the last 168 hours. Cardiac Enzymes: No results for input(s): CKTOTAL, CKMB, CKMBINDEX, TROPONINI in the last 168 hours. BNP (last 3 results) No results for input(s): PROBNP in the last 8760 hours. HbA1C: No results for input(s): HGBA1C in the last 72 hours. CBG: Recent Labs  Lab 02/18/21 1959 02/19/21 0000 02/19/21 0546 02/19/21 0826 02/19/21 1144  GLUCAP 146* 162* 148* 123* 171*   Lipid Profile: No results for input(s): CHOL, HDL, LDLCALC, TRIG, CHOLHDL, LDLDIRECT in the last 72 hours. Thyroid Function Tests: No results for input(s): TSH, T4TOTAL, FREET4, T3FREE, THYROIDAB in the last 72 hours. Anemia Panel: No results for input(s): VITAMINB12, FOLATE, FERRITIN, TIBC, IRON, RETICCTPCT in the last 72 hours. Sepsis Labs: No results for input(s): PROCALCITON, LATICACIDVEN in the last 168 hours.  Recent Results (from the past 240 hour(s))  Urine Culture     Status: Abnormal   Collection Time: 02/12/21  1:15 PM   Specimen: Urine, Random  Result Value Ref Range Status   Specimen Description   Final    URINE, RANDOM Performed at [facility], [address] [place], [place]    Special Requests   Final    NONE Performed at [facility], [address] [place], [place]    Culture 40,000  COLONIES/mL PROTEUS MIRABILIS (A)  Final   Report Status 02/15/2021 FINAL  Final   Organism ID, Bacteria PROTEUS MIRABILIS (A)  Final      Susceptibility   Proteus mirabilis - MIC*    AMPICILLIN <=2 SENSITIVE Sensitive     CEFAZOLIN <=4 SENSITIVE Sensitive     CEFEPIME <=0.12 SENSITIVE Sensitive     CEFTRIAXONE <=0.25 SENSITIVE Sensitive     CIPROFLOXACIN <=0.25 SENSITIVE Sensitive     GENTAMICIN <=1 SENSITIVE Sensitive     IMIPENEM 2 SENSITIVE Sensitive     NITROFURANTOIN 128 RESISTANT Resistant     TRIMETH/SULFA <=20 SENSITIVE Sensitive     AMPICILLIN/SULBACTAM <=2 SENSITIVE Sensitive     PIP/TAZO <=4 SENSITIVE Sensitive     * 40,000 COLONIES/mL PROTEUS MIRABILIS         Radiology Studies:  No results found.      Scheduled Meds:  aspirin  81 mg Per Tube Daily   carvedilol  12.5 mg Per Tube BID WC   Chlorhexidine Gluconate Cloth  6 each Topical Q0600   diclofenac Sodium  2 g Topical QID   docusate  100 mg Per Tube BID   enoxaparin (LOVENOX) injection  0.5 mg/kg Subcutaneous Q24H   famotidine  20 mg Per Tube Daily   feeding supplement (PROSource TF)  90 mL Per Tube QHS   free water  150 mL Per Tube Q4H   hydrALAZINE  50 mg Per Tube TID   insulin aspart  0-15 Units Subcutaneous TID AC & HS   losartan  50 mg Oral Daily   mouth rinse  15 mL Mouth Rinse BID   neomycin-bacitracin-polymyxin   Topical BID   polyethylene glycol  17 g Per Tube Daily   predniSONE  40 mg Per Tube Q breakfast   Ensure Max Protein  11 oz Oral BID PC   Continuous Infusions:  feeding supplement (NEPRO CARB STEADY) 1,000 mL (02/18/21 2003)     LOS: 56 days    Time spent: 27 minutes    Marrion Coy, MD [facility]   To contact the attending provider between 7A-7P or the covering provider during after hours 7P-7A, please log into the web site [email or web address] and access using universal [place] password for that web site. If you do not have the password, please call the hospital  operator.  02/19/2021, 1:29 PM

## 2021-02-20 DIAGNOSIS — I469 Cardiac arrest, cause unspecified: Secondary | ICD-10-CM | POA: Diagnosis not present

## 2021-02-20 DIAGNOSIS — I5033 Acute on chronic diastolic (congestive) heart failure: Secondary | ICD-10-CM | POA: Diagnosis not present

## 2021-02-20 DIAGNOSIS — J9601 Acute respiratory failure with hypoxia: Secondary | ICD-10-CM | POA: Diagnosis not present

## 2021-02-20 DIAGNOSIS — J9602 Acute respiratory failure with hypercapnia: Secondary | ICD-10-CM | POA: Diagnosis not present

## 2021-02-20 LAB — GLUCOSE, CAPILLARY
Glucose-Capillary: 133 mg/dL — ABNORMAL HIGH (ref 70–99)
Glucose-Capillary: 135 mg/dL — ABNORMAL HIGH (ref 70–99)
Glucose-Capillary: 136 mg/dL — ABNORMAL HIGH (ref 70–99)
Glucose-Capillary: 139 mg/dL — ABNORMAL HIGH (ref 70–99)
Glucose-Capillary: 166 mg/dL — ABNORMAL HIGH (ref 70–99)
Glucose-Capillary: 179 mg/dL — ABNORMAL HIGH (ref 70–99)

## 2021-02-20 NOTE — Progress Notes (Signed)
PROGRESS NOTE    Jeremy Browning  IFO:[id number] DOB: [birth year] DOA: 12/25/2020 PCP: No primary care provider on file.    Brief Narrative:  Jeremy Browning is an 60 y.o. male with obesity, DM2, HTN, combined CHF who was admitted on 12/25/2020 with respiratory distress.  He was intubated in the ER also had VT requiring epi/cardioversion.  Hospital course complicated by persistent vent dependent respiratory failure eventually requiring tracheostomy, anoxic encephalopathy. Patient had coronary angiogram performed 5/7, reviewed and normal coronary arteries.  Echocardiogram showed ejection fraction 45 to 50% with grade 1 diastolic dysfunction.   Patient since has improved.  [name] is removed.  Patient is able to start eating, also receiving tube feeding.  Currently he is pending SNF placement   Assessment & Plan:   Active Problems:   Cardiogenic shock (HCC)   Cardiac arrest with successful resuscitation (HCC)   Flash pulmonary edema (HCC)   Cardiopulmonary arrest with successful resuscitation (HCC)   Acute respiratory failure with hypoxia and hypercapnia (HCC)   Acute renal failure (HCC)   Transaminitis   Second degree heart block   Lactic acidosis   Aspiration pneumonia (HCC)   HFrEF (heart failure with reduced ejection fraction) (HCC)   Severe hypoxic-ischemic encephalopathy   SOB (shortness of breath)   Oropharyngeal dysphagia   Pressure injury of skin   Right knee DJD   Essential hypertension   Acute on chronic diastolic CHF (congestive heart failure) (HCC)   Proteus pneumonia (HCC)  Patient doing well today.  Continue nocturnal feeding, calorie count for p.o. intake is ongoing. No change in treatment plan.    DVT prophylaxis: Lovenox Code Status: DNR Family Communication:  Disposition Plan:      Status is: Inpatient   Remains inpatient appropriate because:Unsafe d/c plan   Dispo: The patient is from: Home              Anticipated d/c is to: SNF              Patient  currently is medically stable to d/c.              Difficult to place patient Yes          I/O last 3 completed shifts: In: 1290 [P.O.:840; Other:300; NG/GT:150] Out: 2900 [Urine:2900] No intake/output data recorded.     Consultants:  none  Procedures: none  Antimicrobials: none   Subjective: Patient is upset that his condom catheter is leaking.  But he is doing better.  Appetite seem to be improving.  Denies any short of breath or cough. No fever or chills.  He had a bowel movement yesterday.  No abdominal pain no nausea vomiting  Objective: Vitals:   02/19/21 1607 02/19/21 1943 02/20/21 0459 02/20/21 0804  BP: (!) 167/68 117/63 (!) 134/59 (!) 143/60  Pulse: 63 73 71 61  Resp: 18 18 19 19   Temp: 98.9 F (37.2 C) 98.5 F (36.9 C) 98.9 F (37.2 C) 98.1 F (36.7 C)  TempSrc:  Oral Oral Oral  SpO2: 97% 97% 95% 100%  Weight:      Height:        Intake/Output Summary (Last 24 hours) at 02/20/2021 0951 Last data filed at 02/20/2021 0500 Gross per 24 hour  Intake 930 ml  Output 1900 ml  Net -970 ml   Filed Weights   02/16/21 0500 02/18/21 0414 02/19/21 0549  Weight: 114.6 kg 112.7 kg 113.4 kg    Examination:  General exam: Appears calm and comfortable  Respiratory system:  Clear to auscultation. Respiratory effort normal. Cardiovascular system: S1 & S2 heard, RRR. No JVD, murmurs, rubs, gallops or clicks. No pedal edema. Gastrointestinal system: Abdomen is nondistended, soft and nontender. No organomegaly or masses felt. Normal bowel sounds heard. Central nervous system: Alert and oriented x3. No focal neurological deficits. Extremities: Symmetric 5 x 5 power. Skin: No rashes, lesions or ulcers Psychiatry: Judgement and insight appear normal. Mood & affect appropriate.     Data Reviewed: I have personally reviewed following labs and imaging studies  CBC: Recent Labs  Lab 02/14/21 0644 02/18/21 0458  WBC 14.3* 15.3*  HGB 10.8* 9.8*  HCT 34.8* 31.6*   MCV 80.9 80.4  PLT 240 176   Basic Metabolic Panel: Recent Labs  Lab 02/14/21 0644 02/16/21 1115 02/18/21 0458  NA 136 136 136  K 3.9 4.1 4.2  CL 106 103 105  CO2 25 24 25   GLUCOSE 166* 201* 118*  BUN 46* 44* 39*  CREATININE 1.13 1.12 1.06  CALCIUM 8.6* 8.9 8.7*  MG 2.2 2.1  --   PHOS 3.0 3.3  --    GFR: Estimated Creatinine Clearance: 95.1 mL/min (by C-G formula based on SCr of 1.06 mg/dL). Liver Function Tests: Recent Labs  Lab 02/14/21 0644  AST 85*  ALT 227*  ALKPHOS 88  BILITOT 0.4  PROT 7.6  ALBUMIN 2.8*   No results for input(s): LIPASE, AMYLASE in the last 168 hours. No results for input(s): AMMONIA in the last 168 hours. Coagulation Profile: No results for input(s): INR, PROTIME in the last 168 hours. Cardiac Enzymes: No results for input(s): CKTOTAL, CKMB, CKMBINDEX, TROPONINI in the last 168 hours. BNP (last 3 results) No results for input(s): PROBNP in the last 8760 hours. HbA1C: No results for input(s): HGBA1C in the last 72 hours. CBG: Recent Labs  Lab 02/19/21 1608 02/19/21 1944 02/19/21 2330 02/20/21 0457 02/20/21 0805  GLUCAP 151* 174* 141* 139* 133*   Lipid Profile: No results for input(s): CHOL, HDL, LDLCALC, TRIG, CHOLHDL, LDLDIRECT in the last 72 hours. Thyroid Function Tests: No results for input(s): TSH, T4TOTAL, FREET4, T3FREE, THYROIDAB in the last 72 hours. Anemia Panel: No results for input(s): VITAMINB12, FOLATE, FERRITIN, TIBC, IRON, RETICCTPCT in the last 72 hours. Sepsis Labs: No results for input(s): PROCALCITON, LATICACIDVEN in the last 168 hours.  Recent Results (from the past 240 hour(s))  Urine Culture     Status: Abnormal   Collection Time: 02/12/21  1:15 PM   Specimen: Urine, Random  Result Value Ref Range Status   Specimen Description   Final    URINE, RANDOM Performed at [facility], [address] [place], [place]    Special Requests   Final    NONE Performed at [facility], [address] [place], [place]    Culture 40,000 COLONIES/mL PROTEUS MIRABILIS (A)  Final   Report Status 02/15/2021 FINAL  Final   Organism ID, Bacteria PROTEUS MIRABILIS (A)  Final      Susceptibility   Proteus mirabilis - MIC*    AMPICILLIN <=2 SENSITIVE Sensitive     CEFAZOLIN <=4 SENSITIVE Sensitive     CEFEPIME <=0.12 SENSITIVE Sensitive     CEFTRIAXONE <=0.25 SENSITIVE Sensitive     CIPROFLOXACIN <=0.25 SENSITIVE Sensitive     GENTAMICIN <=1 SENSITIVE Sensitive     IMIPENEM 2 SENSITIVE Sensitive     NITROFURANTOIN 128 RESISTANT Resistant     TRIMETH/SULFA <=20 SENSITIVE Sensitive     AMPICILLIN/SULBACTAM <=2 SENSITIVE  Sensitive     PIP/TAZO <=4 SENSITIVE Sensitive     * 40,000 COLONIES/mL PROTEUS MIRABILIS         Radiology Studies: No results found.      Scheduled Meds:  aspirin  81 mg Per Tube Daily   carvedilol  12.5 mg Per Tube BID WC   Chlorhexidine Gluconate Cloth  6 each Topical Q0600   diclofenac Sodium  2 g Topical QID   docusate  100 mg Per Tube BID   enoxaparin (LOVENOX) injection  0.5 mg/kg Subcutaneous Q24H   famotidine  20 mg Per Tube Daily   feeding supplement (PROSource TF)  90 mL Per Tube QHS   free water  150 mL Per Tube Q4H   hydrALAZINE  50 mg Per Tube TID   insulin aspart  0-15 Units Subcutaneous TID AC & HS   losartan  50 mg Oral Daily   mouth rinse  15 mL Mouth Rinse BID   neomycin-bacitracin-polymyxin   Topical BID   polyethylene glycol  17 g Per Tube Daily   predniSONE  40 mg Per Tube Q breakfast   Ensure Max Protein  11 oz Oral BID PC   Continuous Infusions:  feeding supplement (NEPRO CARB STEADY) 1,000 mL (02/19/21 1953)     LOS: 57 days    Time spent: 24 minutes    Marrion Coy, MD [facility]   To contact the attending provider between 7A-7P or the covering provider during after hours 7P-7A, please log into the web site [email or web address] and access using universal [place] password for that  web site. If you do not have the password, please call the hospital operator.  02/20/2021, 9:51 AM

## 2021-02-21 DIAGNOSIS — J690 Pneumonitis due to inhalation of food and vomit: Secondary | ICD-10-CM | POA: Diagnosis not present

## 2021-02-21 DIAGNOSIS — J9602 Acute respiratory failure with hypercapnia: Secondary | ICD-10-CM | POA: Diagnosis not present

## 2021-02-21 DIAGNOSIS — J9601 Acute respiratory failure with hypoxia: Secondary | ICD-10-CM | POA: Diagnosis not present

## 2021-02-21 DIAGNOSIS — I5033 Acute on chronic diastolic (congestive) heart failure: Secondary | ICD-10-CM | POA: Diagnosis not present

## 2021-02-21 LAB — GLUCOSE, CAPILLARY
Glucose-Capillary: 115 mg/dL — ABNORMAL HIGH (ref 70–99)
Glucose-Capillary: 146 mg/dL — ABNORMAL HIGH (ref 70–99)

## 2021-02-21 NOTE — Progress Notes (Signed)
PROGRESS NOTE    Jeremy Browning  BLT:[id number] DOB: 1960-10-13 DOA: 12/25/2020 PCP: No primary care provider on file.    Brief Narrative:  Jeremy Browning is an 60 y.o. male with obesity, DM2, HTN, combined CHF who was admitted on 12/25/2020 with respiratory distress.  He was intubated in the ER also had VT requiring epi/cardioversion.  Hospital course complicated by persistent vent dependent respiratory failure eventually requiring tracheostomy, anoxic encephalopathy. Patient had coronary angiogram performed 5/7, reviewed and normal coronary arteries.  Echocardiogram showed ejection fraction 45 to 50% with grade 1 diastolic dysfunction.   Patient since has improved.  [name] is removed.  Patient is able to start eating, also receiving tube feeding.  Currently he is pending SNF placement   Assessment & Plan:   Active Problems:   Cardiogenic shock (HCC)   Cardiac arrest with successful resuscitation (HCC)   Flash pulmonary edema (HCC)   Cardiopulmonary arrest with successful resuscitation (HCC)   Acute respiratory failure with hypoxia and hypercapnia (HCC)   Acute renal failure (HCC)   Transaminitis   Second degree heart block   Lactic acidosis   Aspiration pneumonia (HCC)   HFrEF (heart failure with reduced ejection fraction) (HCC)   Severe hypoxic-ischemic encephalopathy   SOB (shortness of breath)   Oropharyngeal dysphagia   Pressure injury of skin   Right knee DJD   Essential hypertension   Acute on chronic diastolic CHF (congestive heart failure) (HCC)   Proteus pneumonia (HCC)  Patient currently stable, no change in treatment.   DVT prophylaxis: Lovenox Code Status: DNR Family Communication:  Disposition Plan:      Status is: Inpatient   Remains inpatient appropriate because:Unsafe d/c plan   Dispo: The patient is from: Home              Anticipated d/c is to: SNF              Patient currently is medically stable to d/c.              Difficult to place patient  Yes       I/O last 3 completed shifts: In: 2140 [P.O.:600; Other:340; NG/GT:1200] Out: 2520 [Urine:2100; Drains:420] Total I/O In: 240 [P.O.:240] Out: -        Subjective: Patient still doing well, appetite improving.  No abdominal pain or nausea vomiting.  Had a large bowel movement last night. No short of breath or cough. No dysuria hematuria pain No fever or chills.  Objective: Vitals:   02/20/21 2000 02/21/21 0438 02/21/21 0500 02/21/21 0758  BP: (!) 90/42 (!) 153/63  (!) 153/59  Pulse: (!) 58 61  (!) 59  Resp:  20  18  Temp:  98.4 F (36.9 C)  98 F (36.7 C)  TempSrc:      SpO2:  100%  100%  Weight:   110.9 kg 110.9 kg  Height:        Intake/Output Summary (Last 24 hours) at 02/21/2021 1155 Last data filed at 02/21/2021 0900 Gross per 24 hour  Intake 1530 ml  Output 1020 ml  Net 510 ml   Filed Weights   02/19/21 0549 02/21/21 0500 02/21/21 0758  Weight: 113.4 kg 110.9 kg 110.9 kg    Examination:  General exam: Appears calm and comfortable  Respiratory system: Clear to auscultation. Respiratory effort normal. Cardiovascular system: S1 & S2 heard, RRR. No JVD, murmurs, rubs, gallops or clicks. No pedal edema. Gastrointestinal system: Abdomen is nondistended, soft and nontender. No organomegaly or masses  felt. Normal bowel sounds heard. Central nervous system: Alert and oriented. No focal neurological deficits. Extremities: Symmetric 5 x 5 power. Skin: No rashes, lesions or ulcers Psychiatry: Judgement and insight appear normal. Mood & affect appropriate.     Data Reviewed: I have personally reviewed following labs and imaging studies  CBC: Recent Labs  Lab 02/18/21 0458  WBC 15.3*  HGB 9.8*  HCT 31.6*  MCV 80.4  PLT 176   Basic Metabolic Panel: Recent Labs  Lab 02/16/21 1115 02/18/21 0458  NA 136 136  K 4.1 4.2  CL 103 105  CO2 24 25  GLUCOSE 201* 118*  BUN 44* 39*  CREATININE 1.12 1.06  CALCIUM 8.9 8.7*  MG 2.1  --   PHOS 3.3   --    GFR: Estimated Creatinine Clearance: 94.1 mL/min (by C-G formula based on SCr of 1.06 mg/dL). Liver Function Tests: No results for input(s): AST, ALT, ALKPHOS, BILITOT, PROT, ALBUMIN in the last 168 hours. No results for input(s): LIPASE, AMYLASE in the last 168 hours. No results for input(s): AMMONIA in the last 168 hours. Coagulation Profile: No results for input(s): INR, PROTIME in the last 168 hours. Cardiac Enzymes: No results for input(s): CKTOTAL, CKMB, CKMBINDEX, TROPONINI in the last 168 hours. BNP (last 3 results) No results for input(s): PROBNP in the last 8760 hours. HbA1C: No results for input(s): HGBA1C in the last 72 hours. CBG: Recent Labs  Lab 02/20/21 1149 02/20/21 1603 02/20/21 1936 02/20/21 2345 02/21/21 0434  GLUCAP 135* 166* 179* 136* 115*   Lipid Profile: No results for input(s): CHOL, HDL, LDLCALC, TRIG, CHOLHDL, LDLDIRECT in the last 72 hours. Thyroid Function Tests: No results for input(s): TSH, T4TOTAL, FREET4, T3FREE, THYROIDAB in the last 72 hours. Anemia Panel: No results for input(s): VITAMINB12, FOLATE, FERRITIN, TIBC, IRON, RETICCTPCT in the last 72 hours. Sepsis Labs: No results for input(s): PROCALCITON, LATICACIDVEN in the last 168 hours.  Recent Results (from the past 240 hour(s))  Urine Culture     Status: Abnormal   Collection Time: 02/12/21  1:15 PM   Specimen: Urine, Random  Result Value Ref Range Status   Specimen Description   Final    URINE, RANDOM Performed at [facility], [address] [place], [place] [address]    Special Requests   Final    NONE Performed at [facility], [address] [place], [place] [address]    Culture 40,000 COLONIES/mL PROTEUS MIRABILIS (A)  Final   Report Status 02/15/2021 FINAL  Final   Organism ID, Bacteria PROTEUS MIRABILIS (A)  Final      Susceptibility   Proteus mirabilis - MIC*    AMPICILLIN <=2 SENSITIVE Sensitive     CEFAZOLIN <=4 SENSITIVE Sensitive      CEFEPIME <=0.12 SENSITIVE Sensitive     CEFTRIAXONE <=0.25 SENSITIVE Sensitive     CIPROFLOXACIN <=0.25 SENSITIVE Sensitive     GENTAMICIN <=1 SENSITIVE Sensitive     IMIPENEM 2 SENSITIVE Sensitive     NITROFURANTOIN 128 RESISTANT Resistant     TRIMETH/SULFA <=20 SENSITIVE Sensitive     AMPICILLIN/SULBACTAM <=2 SENSITIVE Sensitive     PIP/TAZO <=4 SENSITIVE Sensitive     * 40,000 COLONIES/mL PROTEUS MIRABILIS         Radiology Studies: No results found.      Scheduled Meds:  aspirin  81 mg Per Tube Daily   carvedilol  12.5 mg Per Tube BID WC   Chlorhexidine Gluconate Cloth  6 each Topical Q0600  diclofenac Sodium  2 g Topical QID   docusate  100 mg Per Tube BID   enoxaparin (LOVENOX) injection  0.5 mg/kg Subcutaneous Q24H   famotidine  20 mg Per Tube Daily   feeding supplement (PROSource TF)  90 mL Per Tube QHS   free water  150 mL Per Tube Q4H   hydrALAZINE  50 mg Per Tube TID   insulin aspart  0-15 Units Subcutaneous TID AC & HS   losartan  50 mg Oral Daily   mouth rinse  15 mL Mouth Rinse BID   neomycin-bacitracin-polymyxin   Topical BID   polyethylene glycol  17 g Per Tube Daily   predniSONE  40 mg Per Tube Q breakfast   Ensure Max Protein  11 oz Oral BID PC   Continuous Infusions:  feeding supplement (NEPRO CARB STEADY) 1,000 mL (02/20/21 2024)     LOS: 58 days    Time spent: 27 minutes    Marrion Coy, MD [facility]   To contact the attending provider between 7A-7P or the covering provider during after hours 7P-7A, please log into the web site [email or web address] and access using universal [place] password for that web site. If you do not have the password, please call the hospital operator.  02/21/2021, 11:55 AM

## 2021-02-22 DIAGNOSIS — J9602 Acute respiratory failure with hypercapnia: Secondary | ICD-10-CM | POA: Diagnosis not present

## 2021-02-22 DIAGNOSIS — I5033 Acute on chronic diastolic (congestive) heart failure: Secondary | ICD-10-CM | POA: Diagnosis not present

## 2021-02-22 DIAGNOSIS — J690 Pneumonitis due to inhalation of food and vomit: Secondary | ICD-10-CM | POA: Diagnosis not present

## 2021-02-22 DIAGNOSIS — J9601 Acute respiratory failure with hypoxia: Secondary | ICD-10-CM | POA: Diagnosis not present

## 2021-02-22 LAB — GLUCOSE, CAPILLARY
Glucose-Capillary: 116 mg/dL — ABNORMAL HIGH (ref 70–99)
Glucose-Capillary: 129 mg/dL — ABNORMAL HIGH (ref 70–99)
Glucose-Capillary: 130 mg/dL — ABNORMAL HIGH (ref 70–99)
Glucose-Capillary: 130 mg/dL — ABNORMAL HIGH (ref 70–99)
Glucose-Capillary: 146 mg/dL — ABNORMAL HIGH (ref 70–99)
Glucose-Capillary: 156 mg/dL — ABNORMAL HIGH (ref 70–99)
Glucose-Capillary: 159 mg/dL — ABNORMAL HIGH (ref 70–99)
Glucose-Capillary: 162 mg/dL — ABNORMAL HIGH (ref 70–99)

## 2021-02-22 MED ORDER — LOSARTAN POTASSIUM 50 MG PO TABS
100.0000 mg | ORAL_TABLET | Freq: Every day | ORAL | Status: DC
  Administered 2021-02-23 – 2021-03-06 (×12): 100 mg via ORAL
  Filled 2021-02-22 (×12): qty 2

## 2021-02-22 NOTE — Progress Notes (Signed)
Physical Therapy Treatment Patient Details Name: Jeremy Browning MRN: [id number] DOB: 10/11/1960 Today's Date: 02/22/2021    History of Present Illness Pt is a 60 yo male that presented from home with respiratory distress 12/25/2020. Underwent defibrillation and CPR, emergently intubated. During admission pt has had a cardiac cath, peg tube and tracheostomy placed, weaned from vent 5/26 AM. 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 Mildly restricted diffusion involving the cortex more diffusely over the right cerebral convexity favored to reflect an acute MCA territory infarct over global hypoxic injury given unilaterality".    PT Comments    Pt seated in chair, alert, motivated for PT. Pt performed standing with sit<>stand lift x 3 with neuromuscular assist for quad activation, foot placement, and back extension. Progressed activity to sit <> stand EOB max assist x 2 w/ elevated bed x 2. Added exercises to HEP to facilitate hip add particularly for LLE and spinal extension to improve forward lean during sit > stand tasks. Skilled PT intervention is indicated to address deficits in function, mobility, and to return to PLOF as able. Discharge recommendations remain CIR due to continual improvements in functional mobility.    Follow Up Recommendations  CIR     Equipment Recommendations  None recommended by PT    Recommendations for Other Services       Precautions / Restrictions Precautions Precautions: Fall Precaution Comments: PEG Restrictions Weight Bearing Restrictions: No    Mobility  Bed Mobility Overal bed mobility: Needs Assistance Bed Mobility: Sit to Supine       Sit to supine: Min assist   General bed mobility comments: assistance for scooting    Transfers Overall transfer level: Needs assistance Equipment used:  (stander) Transfers: Sit to/from Stand Sit to Stand: Max assist;+2 physical assistance;From elevated  surface        Lateral/Scoot Transfers: Medical sales representative transfer comment: Max A for transfers x 2  Ambulation/Gait                 Stairs             Wheelchair Mobility    Modified Rankin (Stroke Patients Only)       Balance Overall balance assessment: Needs assistance Sitting-balance support: Feet supported;Single extremity supported Sitting balance-Leahy Scale: Good Sitting balance - Comments: Supervision for safety   Standing balance support: Bilateral upper extremity supported Standing balance-Leahy Scale: Zero Standing balance comment: unable to achieve full upright posture                            Cognition Arousal/Alertness: Awake/alert Behavior During Therapy: WFL for tasks assessed/performed Overall Cognitive Status: Impaired/Different from baseline Area of Impairment: Rancho level;Following commands;Safety/judgement               Rancho Levels of Cognitive Functioning Rancho Los Amigos Scales of Cognitive Functioning: Confused/appropriate Orientation Level: Person;Situation     Following Commands: Follows one step commands consistently;Follows one step commands with increased time Safety/Judgement: Decreased awareness of deficits;Decreased awareness of safety     General Comments: Pt requires increased time with activities, reminders for task attention needed      Exercises General Exercises - Lower Extremity Hip ABduction/ADduction: AROM;Both;10 reps;Supine Other Exercises Other Exercises: neuromuscular re-edu: hip add w/ focus on LLE, bridging w/ min-assist for LLE x 5    General Comments        Pertinent Vitals/Pain Pain Assessment: Faces Faces Pain  Scale: No hurt    Home Living                      Prior Function            PT Goals (current goals can now be found in the care plan section) Acute Rehab PT Goals Patient Stated Goal: to get better PT Goal Formulation: With patient Time For  Goal Achievement: 02/25/21 Potential to Achieve Goals: Good Progress towards PT goals: Progressing toward goals    Frequency    Min 2X/week      PT Plan Current plan remains appropriate    Co-evaluation              AM-PAC PT "6 Clicks" Mobility   Outcome Measure  Help needed turning from your back to your side while in a flat bed without using bedrails?: Total Help needed moving from lying on your back to sitting on the side of a flat bed without using bedrails?: Total Help needed moving to and from a bed to a chair (including a wheelchair)?: Total Help needed standing up from a chair using your arms (e.g., wheelchair or bedside chair)?: Total Help needed to walk in hospital room?: Total Help needed climbing 3-5 steps with a railing? : Total 6 Click Score: 6    End of Session Equipment Utilized During Treatment: Gait belt Activity Tolerance: Patient tolerated treatment well Patient left: in bed;with call bell/phone within reach;with bed alarm set Nurse Communication: Mobility status PT Visit Diagnosis: Other abnormalities of gait and mobility (R26.89);Muscle weakness (generalized) (M62.81);Other symptoms and signs involving the nervous system (R29.898)     Time: 1430-1520 PT Time Calculation (min) (ACUTE ONLY): 50 min  Charges:                        [employer], SPT

## 2021-02-22 NOTE — Progress Notes (Signed)
PROGRESS NOTE    Jeremy Browning  KVQ:[id number] DOB: 01-07-1961 DOA: 12/25/2020 PCP: No primary care provider on file.    Brief Narrative:  Jeremy Browning is an 60 y.o. male with obesity, DM2, HTN, combined CHF who was admitted on 12/25/2020 with respiratory distress.  He was intubated in the ER also had VT requiring epi/cardioversion.  Hospital course complicated by persistent vent dependent respiratory failure eventually requiring tracheostomy, anoxic encephalopathy. Patient had coronary angiogram performed 5/7, reviewed and normal coronary arteries.  Echocardiogram showed ejection fraction 45 to 50% with grade 1 diastolic dysfunction.   Patient since has improved.  [name] is removed.  Patient is able to start eating, also receiving tube feeding.  Currently no discharge option.   Assessment & Plan:   Active Problems:   Cardiogenic shock (HCC)   Cardiac arrest with successful resuscitation (HCC)   Flash pulmonary edema (HCC)   Cardiopulmonary arrest with successful resuscitation (HCC)   Acute respiratory failure with hypoxia and hypercapnia (HCC)   Acute renal failure (HCC)   Transaminitis   Second degree heart block   Lactic acidosis   Aspiration pneumonia (HCC)   HFrEF (heart failure with reduced ejection fraction) (HCC)   Severe hypoxic-ischemic encephalopathy   SOB (shortness of breath)   Oropharyngeal dysphagia   Pressure injury of skin   Right knee DJD   Essential hypertension   Acute on chronic diastolic CHF (congestive heart failure) (HCC)   Proteus pneumonia (HCC)  Patient is currently stable.  Appetite improving.  RD is reducing nocturnal tube feeding based on daytime calorie intake. He is continued on PT/OT.  He need nursing home placement, but currently no acceptance.  He may have to continue PT/OT in the hospital until he is ready to discharge home. Blood pressure is running high today, increase losartan to 100 mg daily.   DVT prophylaxis: Lovenox Code Status:  DNR Family Communication:  Disposition Plan:    Status is: Inpatient  Remains inpatient appropriate because:Unsafe d/c plan  Dispo: The patient is from: Home              Anticipated d/c is to: Uncertain              Patient currently is medically stable to d/c.   Difficult to place patient Yes        I/O last 3 completed shifts: In: 64332 [P.O.:840; NG/GT:25224] Out: 1320 [Urine:900; Drains:420] Total I/O In: 240 [P.O.:240] Out: 600 [Urine:600]     Consultants:  None  Procedures: Trach/Peg  Antimicrobials: None  Subjective: Patient doing well, his appetite is gradually improving.  No nausea vomiting.  He has been having daily bowel movement without diarrhea. No fever or chills. No dysuria hematuria. No short of breath or cough.  Objective: Vitals:   02/21/21 1957 02/22/21 0423 02/22/21 0817 02/22/21 1101  BP: (!) 131/58 (!) 166/52 (!) 145/55 (!) 173/52  Pulse: (!) 55 63 72 72  Resp:  16 17 17   Temp: 98.4 F (36.9 C) 98.2 F (36.8 C) 98.1 F (36.7 C) 98.7 F (37.1 C)  TempSrc: Oral Oral    SpO2: 100% 100% 100% 100%  Weight:      Height:        Intake/Output Summary (Last 24 hours) at 02/22/2021 1149 Last data filed at 02/22/2021 1100 Gross per 24 hour  Intake 04/25/2021 ml  Output 900 ml  Net 24114 ml   Filed Weights   02/19/21 0549 02/21/21 0500 02/21/21 0758  Weight: 113.4 kg 110.9 kg  110.9 kg    Examination:  General exam: Appears calm and comfortable  Respiratory system: Clear to auscultation. Respiratory effort normal. Cardiovascular system: S1 & S2 heard, RRR. No JVD, murmurs, rubs, gallops or clicks. No pedal edema. Gastrointestinal system: Abdomen is nondistended, soft and nontender. No organomegaly or masses felt. Normal bowel sounds heard. Central nervous system: Alert and oriented x2. No focal neurological deficits. Extremities: Symmetric 5 x 5 power. Skin: No rashes, lesions or ulcers Psychiatry: Mood & affect appropriate.     Data  Reviewed: I have personally reviewed following labs and imaging studies  CBC: Recent Labs  Lab 02/18/21 0458  WBC 15.3*  HGB 9.8*  HCT 31.6*  MCV 80.4  PLT 176   Basic Metabolic Panel: Recent Labs  Lab 02/16/21 1115 02/18/21 0458  NA 136 136  K 4.1 4.2  CL 103 105  CO2 24 25  GLUCOSE 201* 118*  BUN 44* 39*  CREATININE 1.12 1.06  CALCIUM 8.9 8.7*  MG 2.1  --   PHOS 3.3  --    GFR: Estimated Creatinine Clearance: 94.1 mL/min (by C-G formula based on SCr of 1.06 mg/dL). Liver Function Tests: No results for input(s): AST, ALT, ALKPHOS, BILITOT, PROT, ALBUMIN in the last 168 hours. No results for input(s): LIPASE, AMYLASE in the last 168 hours. No results for input(s): AMMONIA in the last 168 hours. Coagulation Profile: No results for input(s): INR, PROTIME in the last 168 hours. Cardiac Enzymes: No results for input(s): CKTOTAL, CKMB, CKMBINDEX, TROPONINI in the last 168 hours. BNP (last 3 results) No results for input(s): PROBNP in the last 8760 hours. HbA1C: No results for input(s): HGBA1C in the last 72 hours. CBG: Recent Labs  Lab 02/21/21 2057 02/22/21 0029 02/22/21 0540 02/22/21 0819 02/22/21 1108  GLUCAP 146* 116* 130* 129* 159*   Lipid Profile: No results for input(s): CHOL, HDL, LDLCALC, TRIG, CHOLHDL, LDLDIRECT in the last 72 hours. Thyroid Function Tests: No results for input(s): TSH, T4TOTAL, FREET4, T3FREE, THYROIDAB in the last 72 hours. Anemia Panel: No results for input(s): VITAMINB12, FOLATE, FERRITIN, TIBC, IRON, RETICCTPCT in the last 72 hours. Sepsis Labs: No results for input(s): PROCALCITON, LATICACIDVEN in the last 168 hours.  Recent Results (from the past 240 hour(s))  Urine Culture     Status: Abnormal   Collection Time: 02/12/21  1:15 PM   Specimen: Urine, Random  Result Value Ref Range Status   Specimen Description   Final    URINE, RANDOM Performed at [facility], [address], [place], [place] [address]     Special Requests   Final    NONE Performed at [facility], [address] [place], [place] [address]    Culture 40,000 COLONIES/mL PROTEUS MIRABILIS (A)  Final   Report Status 02/15/2021 FINAL  Final   Organism ID, Bacteria PROTEUS MIRABILIS (A)  Final      Susceptibility   Proteus mirabilis - MIC*    AMPICILLIN <=2 SENSITIVE Sensitive     CEFAZOLIN <=4 SENSITIVE Sensitive     CEFEPIME <=0.12 SENSITIVE Sensitive     CEFTRIAXONE <=0.25 SENSITIVE Sensitive     CIPROFLOXACIN <=0.25 SENSITIVE Sensitive     GENTAMICIN <=1 SENSITIVE Sensitive     IMIPENEM 2 SENSITIVE Sensitive     NITROFURANTOIN 128 RESISTANT Resistant     TRIMETH/SULFA <=20 SENSITIVE Sensitive     AMPICILLIN/SULBACTAM <=2 SENSITIVE Sensitive     PIP/TAZO <=4 SENSITIVE Sensitive     * 40,000 COLONIES/mL PROTEUS MIRABILIS  Radiology Studies: No results found.      Scheduled Meds:  aspirin  81 mg Per Tube Daily   carvedilol  12.5 mg Per Tube BID WC   Chlorhexidine Gluconate Cloth  6 each Topical Q0600   diclofenac Sodium  2 g Topical QID   docusate  100 mg Per Tube BID   enoxaparin (LOVENOX) injection  0.5 mg/kg Subcutaneous Q24H   famotidine  20 mg Per Tube Daily   feeding supplement (PROSource TF)  90 mL Per Tube QHS   free water  150 mL Per Tube Q4H   hydrALAZINE  50 mg Per Tube TID   insulin aspart  0-15 Units Subcutaneous TID AC & HS   losartan  50 mg Oral Daily   mouth rinse  15 mL Mouth Rinse BID   neomycin-bacitracin-polymyxin   Topical BID   polyethylene glycol  17 g Per Tube Daily   predniSONE  40 mg Per Tube Q breakfast   Ensure Max Protein  11 oz Oral BID PC   Continuous Infusions:  feeding supplement (NEPRO CARB STEADY) 1,000 mL (02/20/21 2024)     LOS: 59 days    Time spent: 27 minutes    Marrion Coy, MD [facility]   To contact the attending provider between 7A-7P or the covering provider during after hours 7P-7A, please log into the web site  [email or web address] and access using universal [place] password for that web site. If you do not have the password, please call the hospital operator.  02/22/2021, 11:49 AM

## 2021-02-22 NOTE — Progress Notes (Signed)
Occupational Therapy Treatment Patient Details Name: Jeremy Browning MRN: [id number] DOB: 1960/10/29 Today's Date: 02/22/2021    History of present illness Pt is a 60 yo male that presented from home with respiratory distress 12/25/2020. Underwent defibrillation and CPR, emergently intubated. During admission pt has had a cardiac cath, peg tube and tracheostomy placed, weaned from vent 5/26 AM. 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 Mildly restricted diffusion involving the cortex more diffusely over the right cerebral convexity favored to reflect an acute MCA territory infarct over global hypoxic injury given unilaterality".   OT comments  Upon entering the room, pt supine in bed and sleeping soundly but agreeable to OT intervention. Pt performs bed mobility with supervision and min cuing for hand placement and technique. Pt seated on EOB with supervision. OT discussed and demonstrate squat pivot transfer. Pt returning demonstration and transfers to the R towards recliner chair with squat pivot. Pt bearing weight through B UEs with transfer and managing balance with max lifting assistance into chair. Pt demonstrates forward weight shift with cuing for therapist to place pillow for comfort. Hospital gown changed with mod cuing for therapist to thread B UEs into gown. Pt having difficulty generalizing donning gown vs shirt. Pt requesting assistance to open ensure and drinks several sips with min spillage from mouth and cuing to wipe mouth. He was able to demonstrate active digit flexion and extension on command with L hand this session. Pt is in good spirits with all needs within reach. NT present in the room and sling placed under pt for transfer back to bed if needed. Pt continues to benefit from OT intervention.   Follow Up Recommendations  SNF;Supervision/Assistance - 24 hour    Equipment Recommendations  Other (comment) (defer to next venue of care)        Precautions / Restrictions Precautions Precautions: Fall Precaution Comments: PEG       Mobility Bed Mobility Overal bed mobility: Needs Assistance Bed Mobility: Supine to Sit     Supine to sit: Supervision     General bed mobility comments: cuing for technique    Transfers Overall transfer level: Needs assistance   Transfers: Squat Pivot Transfers Sit to Stand: Max assist   Squat pivot transfers: Max assist     General transfer comment: Max A of 1 to recliner chair to the R. Second person available if needed but not hands on for transfer.    Balance Overall balance assessment: Needs assistance Sitting-balance support: Feet supported;Bilateral upper extremity supported Sitting balance-Leahy Scale: Good                                     ADL either performed or assessed with clinical judgement   ADL       Grooming: Wash/dry face;Set up;Sitting           Upper Body Dressing : Minimal assistance Upper Body Dressing Details (indicate cue type and reason): to don hospital gown                         Vision Baseline Vision/History: No visual deficits Patient Visual Report: No change from baseline            Cognition Arousal/Alertness: Awake/alert Behavior During Therapy: WFL for tasks assessed/performed Overall Cognitive Status: Impaired/Different from baseline Area of Impairment: Rancho level;Following commands;Safety/judgement  Rancho Levels of Cognitive Functioning Rancho Los Amigos Scales of Cognitive Functioning: Confused/appropriate Orientation Level: Disoriented to;Time     Following Commands: Follows one step commands consistently;Follows one step commands with increased time Safety/Judgement: Decreased awareness of deficits;Decreased awareness of safety     General Comments: Pt needing min cuing for sequencing functional tasks and increased time to process                    Pertinent Vitals/ Pain       Pain Assessment: Faces Faces Pain Scale: No hurt   Frequency  Min 2X/week        Progress Toward Goals  OT Goals(current goals can now be found in the care plan section)  Progress towards OT goals: Progressing toward goals  Acute Rehab OT Goals Patient Stated Goal: to get better OT Goal Formulation: With patient Time For Goal Achievement: 03/04/21 Potential to Achieve Goals: Fair  Plan Discharge plan remains appropriate;Frequency remains appropriate       AM-PAC OT "6 Clicks" Daily Activity     Outcome Measure   Help from another person eating meals?: A Little Help from another person taking care of personal grooming?: A Little Help from another person toileting, which includes using toliet, bedpan, or urinal?: Total Help from another person bathing (including washing, rinsing, drying)?: A Lot Help from another person to put on and taking off regular upper body clothing?: A Little Help from another person to put on and taking off regular lower body clothing?: A Lot 6 Click Score: 14    End of Session    OT Visit Diagnosis: Other symptoms and signs involving the nervous system (R29.898);Other symptoms and signs involving cognitive function   Activity Tolerance Patient tolerated treatment well   Patient Left with call bell/phone within reach;in chair;with chair alarm set   Nurse Communication Mobility status        Time: 2778-2423 OT Time Calculation (min): 24 min  Charges: OT General Charges $OT Visit: 1 Visit OT Treatments $Self Care/Home Management : 8-22 mins $Therapeutic Activity: 8-22 mins  Jackquline Denmark, MS, OTR/L , CBIS ascom [phone]  02/22/21, 11:48 AM

## 2021-02-23 DIAGNOSIS — R5381 Other malaise: Secondary | ICD-10-CM | POA: Diagnosis not present

## 2021-02-23 LAB — GLUCOSE, CAPILLARY
Glucose-Capillary: 201 mg/dL — ABNORMAL HIGH (ref 70–99)
Glucose-Capillary: 146 mg/dL — ABNORMAL HIGH (ref 70–99)
Glucose-Capillary: 121 mg/dL — ABNORMAL HIGH (ref 70–99)
Glucose-Capillary: 163 mg/dL — ABNORMAL HIGH (ref 70–99)
Glucose-Capillary: 161 mg/dL — ABNORMAL HIGH (ref 70–99)
Glucose-Capillary: 157 mg/dL — ABNORMAL HIGH (ref 70–99)

## 2021-02-23 NOTE — Progress Notes (Signed)
Occupational Therapy Treatment Patient Details Name: Jeremy Browning MRN: [id number] DOB: 1961/06/02 Today's Date: 02/23/2021    History of present illness Pt is a 60 yo male that presented from home with respiratory distress 12/25/2020. Underwent defibrillation and CPR, emergently intubated. During admission pt has had a cardiac cath, peg tube and tracheostomy placed, weaned from vent 5/26 AM. 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 Mildly restricted diffusion involving the cortex more diffusely over the right cerebral convexity favored to reflect an acute MCA territory infarct over global hypoxic injury given unilaterality".   OT comments  Mr Gillian was seen for OT treatment on this date. Upon arrival to room pt reclinedin bed, agreeable to session. Pt requires MIN A exit L side of bed, assist for LLE mgmt. Pt tolerated ~25 mins sitting EOB for drawing activity, benefited from WBing through forearm for improved tremors in dominate RUE. SBA drawing seated EOB - intermittent MIN A for LUE placement to assist with dominant R tremors. Weighted pencil grip demo-ed and left at bedside. MIN A for lateral scoot t/f along EOB.   Upon return to bed noted to have BM and urine soaked sheets. MAX A perihygiene at bed level. MIN A don/doff gown at bed level, assist for LUE mgmt. NT in to change male purewick. Pt making good progress toward goals. Pt continues to benefit from skilled OT services to maximize return to PLOF and minimize risk of future falls, injury, caregiver burden, and readmission. Will continue to follow POC. Discharge recommendation remains appropriate.    Follow Up Recommendations  SNF;Supervision/Assistance - 24 hour    Equipment Recommendations  Other (comment) (TBD, likely hopsital bed)    Recommendations for Other Services      Precautions / Restrictions Precautions Precautions: Fall Precaution Comments: PEG Restrictions Weight  Bearing Restrictions: No       Mobility Bed Mobility Overal bed mobility: Needs Assistance Bed Mobility: Rolling;Sit to Supine;Supine to Sit Rolling: Min assist   Supine to sit: Min assist Sit to supine: Min guard   General bed mobility comments: assistance for LLE mgmt to exit bed    Transfers Overall transfer level: Needs assistance   Transfers: Lateral/Scoot Transfers          Lateral/Scoot Transfers: Min assist      Balance Overall balance assessment: Needs assistance Sitting-balance support: Feet supported;Single extremity supported Sitting balance-Leahy Scale: Good Sitting balance - Comments: Supervision for safety                                   ADL either performed or assessed with clinical judgement   ADL Overall ADL's : Needs assistance/impaired                                       General ADL Comments: MAX A perihygiene at bed level. MIN A don/doff gown at bed elvel, assist for LUE mgmt. MIN A for lateral scoot t/f along EOB. SBA drawing seated EOB - intermittent MIN A for LUE placement to assist with dominant R tremors.      Cognition Arousal/Alertness: Awake/alert Behavior During Therapy: WFL for tasks assessed/performed Overall Cognitive Status: Impaired/Different from baseline Area of Impairment: Rancho level;Following commands;Safety/judgement               Rancho Levels of Cognitive  Functioning Rancho Mirant Scales of Cognitive Functioning: Confused/appropriate       Following Commands: Follows one step commands with increased time Safety/Judgement: Decreased awareness of deficits;Decreased awareness of safety              Exercises Exercises: Other exercises Other Exercises Other Exercises: Pt educated re: OT role, falls prevention, tremor mgmt techniques, HEP Other Exercises: UBD, sup<>sit, lateral scoot, rolling, sitting balance/tolerance, drawing           Pertinent Vitals/ Pain        Pain Assessment: No/denies pain         Frequency  Min 2X/week        Progress Toward Goals  OT Goals(current goals can now be found in the care plan section)  Progress towards OT goals: Progressing toward goals  Acute Rehab OT Goals Patient Stated Goal: to get better OT Goal Formulation: With patient Time For Goal Achievement: 03/04/21 Potential to Achieve Goals: Fair ADL Goals Pt Will Perform Grooming: sitting;with modified independence Pt Will Perform Upper Body Dressing: with supervision;sitting Pt Will Transfer to Toilet: with max assist;bedside commode Additional ADL Goal #1: Pt's care team will demo competency in b/l resting hand splint wear schedule and donning/doffing with no cues Additional ADL Goal #2: Pt will complete 1-2 step ADL task with MOD A Additional ADL Goal #3: Pt will experience at least 30% reduction of b/l UE edema through manual techniques as evidenced by no pitting in hands.  Plan Discharge plan remains appropriate;Frequency remains appropriate       AM-PAC OT "6 Clicks" Daily Activity     Outcome Measure   Help from another person eating meals?: A Little Help from another person taking care of personal grooming?: A Little Help from another person toileting, which includes using toliet, bedpan, or urinal?: A Lot Help from another person bathing (including washing, rinsing, drying)?: A Lot Help from another person to put on and taking off regular upper body clothing?: A Little Help from another person to put on and taking off regular lower body clothing?: A Lot 6 Click Score: 15    End of Session    OT Visit Diagnosis: Other symptoms and signs involving the nervous system (R29.898);Other symptoms and signs involving cognitive function   Activity Tolerance Patient tolerated treatment well   Patient Left in bed;with call bell/phone within reach;with bed alarm set;with nursing/sitter in room   Nurse Communication          Time:  7035-0093 OT Time Calculation (min): 48 min  Charges: OT General Charges $OT Visit: 1 Visit OT Treatments $Self Care/Home Management : 8-22 mins $Therapeutic Activity: 23-37 mins  Kathie Dike, M.S. OTR/L  02/23/21, 4:24 PM  ascom [phone]

## 2021-02-23 NOTE — Evaluation (Signed)
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   Essential hypertension 02/16/2021   Pressure injury of skin 02/10/2021   Oropharyngeal dysphagia    SOB (shortness of breath)    Severe hypoxic-ischemic encephalopathy    HFrEF (heart failure with reduced ejection fraction) (HCC)    Cardiogenic shock (HCC) 12/25/2020   Cardiopulmonary arrest with successful resuscitation (HCC) 12/25/2020   Acute renal failure (HCC) 12/25/2020   Transaminitis 12/25/2020   Second degree heart block 12/25/2020   Lactic acidosis 12/25/2020   Aspiration pneumonia (HCC) 12/25/2020   Cardiac arrest with successful resuscitation (HCC)    Flash pulmonary edema (HCC)    Acute respiratory failure with hypoxia and hypercapnia (HCC)    Past Medical History:  Past Medical History:  Diagnosis Date   Diabetes mellitus type II, non insulin dependent (HCC)    Essential hypertension [name]   Heart failure (HCC)    Unknown details.  By report no cardiac surgery   Venous stasis dermatitis of left lower extremity    Past Surgical History:  Past Surgical History:  Procedure Laterality Date   CARDIAC CATHETERIZATION     LEFT HEART CATH AND CORONARY ANGIOGRAPHY N/A 12/25/2020   Procedure: LEFT HEART CATH AND CORONARY ANGIOGRAPHY;  Surgeon: Marykay Lex, MD;  Location: [facility] INVASIVE CV LAB;  Service: Cardiovascular;  Laterality: N/A;   PEG PLACEMENT N/A 01/11/2021   Procedure: PERCUTANEOUS ENDOSCOPIC GASTROSTOMY (PEG) PLACEMENT;  Surgeon: Midge Minium, MD;  Location: [facility] ENDOSCOPY;  Service: Endoscopy;  Laterality: N/A;   RIGHT HEART CATH N/A 12/25/2020    Procedure: RIGHT HEART CATH;  Surgeon: Marykay Lex, MD;  Location[facility] INVASIVE CV LAB;  Service: Cardiovascular;  Laterality: N/A;   TRACHEOSTOMY TUBE PLACEMENT N/A 01/12/2021   Procedure: TRACHEOSTOMY;  Surgeon: Bud Face, MD;  Location: [facility] ORS;  Service: ENT;  Laterality: N/A;   Unknown     HPI:  Pt is a 60 yo male that presented from home with respiratory distress 12/25/2020. Underwent defibrillation and CPR, emergently intubated. During admission pt has had a cardiac cath, peg tube and tracheostomy placed, weaned from vent 01/13/2021. 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 Mildly restricted diffusion involving the cortex more diffusely over the right cerebral convexity favored to reflect an acute MCA territory infarct over global hypoxic injury given unilaterality".  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 Pt has been decannulated and is consuming dysphagia 1 with thin liquids.   Assessment / Plan / Recommendation Clinical Impression  Pt was re-evaluated for progress and to adjust goals as appropriate. Pt presents with behaviors characteristic of Rancho Level VI - confused appropriate. This is great progress over previous level of Rancho II. Currently pt is able to communicate verbally with good speech intelligibility and use of language. He is able to sustain attention to tasks, asks appropriate questions, recalls therapists' names, able to follow directions, oriented to himself, location. Pt continues to present with deficits in left attention, emergent awareness, problem solving, moderate left facial weakness, orientation  to time and situation. Pt continues to benefit from skilled ST services to maximize return to PLOF and minimize risk of future falls, injury, malnutrition, dehydration,  caregiver burden, and readmission. POC updated to reflect pt's progress. Discharge recommendation remains appropriate.    SLP Assessment  SLP Recommendation/Assessment: Patient needs continued Speech Lanaguage Pathology Services SLP Visit Diagnosis: Cognitive communication deficit (R41.841);Dysphagia, oral phase (R13.11)    Follow Up Recommendations  Skilled Nursing facility;24 hour supervision/assistance    Frequency and Duration min 2x/week  4 weeks      SLP Evaluation Cognition  Arousal/Alertness: Awake/alert Orientation Level: Oriented to person;Oriented to place;Disoriented to time;Disoriented to situation Attention: Selective Selective Attention: Impaired Selective Attention Impairment: Verbal complex;Functional complex Memory: Impaired Memory Impairment: Retrieval deficit;Decreased recall of new information;Decreased short term memory Decreased Short Term Memory: Verbal complex;Functional complex Awareness: Impaired Awareness Impairment: Anticipatory impairment Problem Solving: Impaired Problem Solving Impairment: Functional basic;Verbal basic Safety/Judgment: Appears intact Rancho Mirant Scales of Cognitive Functioning: Confused/appropriate       Comprehension  Auditory Comprehension Overall Auditory Comprehension: Appears within functional limits for tasks assessed Visual Recognition/Discrimination Discrimination: Exceptions to [facility] Reading Comprehension Reading Status: Within funtional limits (Month, day of week)    Expression Expression Primary Mode of Expression: Verbal Verbal Expression Overall Verbal Expression: Appears within functional limits for tasks assessed Initiation: No impairment Automatic Speech: Name;Social Response;Day of week;Month of year Level of Generative/Spontaneous Verbalization: Sentence;Conversation Repetition: No impairment Naming: No impairment Confrontation: Within functional limits Pragmatics: No impairment Non-Verbal Means  of Communication: Not applicable Written Expression Dominant Hand: Right Written Expression: Not tested   Oral / Motor  Oral Motor/Sensory Function Overall Oral Motor/Sensory Function: Moderate impairment Facial ROM: Reduced left Facial Symmetry: Abnormal symmetry left Facial Strength: Reduced left Facial Sensation: Reduced left Lingual ROM: Reduced left Lingual Symmetry: Abnormal symmetry left Lingual Strength: Reduced Lingual Sensation: Reduced Mandible: Within Functional Limits Motor Speech Overall Motor Speech: Appears within functional limits for tasks assessed Respiration: Within functional limits Phonation: Normal Resonance: Within functional limits Articulation: Within functional limitis Intelligibility: Intelligible Motor Planning: Witnin functional limits Motor Speech Errors: Not applicable   GO                   Blasa Raisch B. Dreama Saa M.S., CCC-SLP, [facility] Speech-Language Pathologist Rehabilitation Services Office [phone]  Jeremy Browning 02/23/2021, 5:11 PM

## 2021-02-23 NOTE — Plan of Care (Signed)
  Problem: Clinical Measurements: Goal: Will remain free from infection Outcome: Progressing Goal: Diagnostic test results will improve Outcome: Progressing Goal: Cardiovascular complication will be avoided Outcome: Progressing   Problem: Elimination: Goal: Will not experience complications related to bowel motility Outcome: Progressing   Problem: Safety: Goal: Ability to remain free from injury will improve Outcome: Progressing   Problem: Skin Integrity: Goal: Risk for impaired skin integrity will decrease Outcome: Progressing   Problem: Respiratory: Goal: Ability to maintain a clear airway and adequate ventilation will improve Outcome: Progressing   Problem: Role Relationship: Goal: Method of communication will improve Outcome: Progressing

## 2021-02-23 NOTE — Progress Notes (Signed)
Physical Therapy Treatment Patient Details Name: Jeremy Browning MRN: [id number] DOB: 09-13-1960 Today's Date: 02/23/2021    History of Present Illness Pt is a 60 yo male that presented from home with respiratory distress 12/25/2020. Underwent defibrillation and CPR, emergently intubated. During admission pt has had a cardiac cath, peg tube and tracheostomy placed, weaned from vent 5/26 AM. 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 Mildly restricted diffusion involving the cortex more diffusely over the right cerebral convexity favored to reflect an acute MCA territory infarct over global hypoxic injury given unilaterality".    PT Comments    Pt alert, disoriented to year, day of the week. Continued treatment with sit > stand lift for muscle facilitation focusing on hip, knee, and back extension. Transferred to [facility] with stander, assisted with peri-care. Pt noted displeasure with stander usage today due to feeling "cramped." PT to continue working on building pt tolerance for hip adduction and back extension with treatment. Skilled PT intervention is indicated to address deficits in function, mobility, and to return to PLOF as able.  Discharge recommendations remain CIR.    Follow Up Recommendations  CIR     Equipment Recommendations  None recommended by PT    Recommendations for Other Services       Precautions / Restrictions Precautions Precautions: Fall Restrictions Weight Bearing Restrictions: No    Mobility  Bed Mobility Overal bed mobility: Needs Assistance       Supine to sit: Supervision          Transfers Overall transfer level: Needs assistance Equipment used:  (Stander) Transfers: Sit to/from Stand Sit to Stand: Max assist;+2 physical assistance;From elevated surface         General transfer comment: Max A for transfers x 2  Ambulation/Gait                 Stairs             Wheelchair  Mobility    Modified Rankin (Stroke Patients Only)       Balance Overall balance assessment: Needs assistance Sitting-balance support: Feet supported;Single extremity supported Sitting balance-Leahy Scale: Good Sitting balance - Comments: Supervision for safety   Standing balance support: Bilateral upper extremity supported Standing balance-Leahy Scale: Zero Standing balance comment: unable to achieve full upright posture                            Cognition Arousal/Alertness: Awake/alert Behavior During Therapy: WFL for tasks assessed/performed Overall Cognitive Status: Impaired/Different from baseline Area of Impairment: Rancho level;Following commands;Safety/judgement                 Orientation Level: Person     Following Commands: Follows one step commands consistently;Follows one step commands with increased time Safety/Judgement: Decreased awareness of deficits;Decreased awareness of safety     General Comments: Disoriented to date, day of week      Exercises General Exercises - Lower Extremity Long Arc Quad: AROM;Seated;10 reps;Left Hip ABduction/ADduction: AROM;Both;10 reps;Supine (adduction, LLE focus) Other Exercises Other Exercises: Assisted w/ pericare BSC via stander    General Comments        Pertinent Vitals/Pain Pain Assessment: Faces Faces Pain Scale: No hurt Pain Intervention(s): Monitored during session    Home Living                      Prior Function  PT Goals (current goals can now be found in the care plan section) Acute Rehab PT Goals Patient Stated Goal: to get better PT Goal Formulation: With patient Time For Goal Achievement: 02/25/21 Potential to Achieve Goals: Good Progress towards PT goals: Progressing toward goals    Frequency    Min 2X/week      PT Plan Current plan remains appropriate    Co-evaluation              AM-PAC PT "6 Clicks" Mobility   Outcome Measure   Help needed turning from your back to your side while in a flat bed without using bedrails?: A Lot Help needed moving from lying on your back to sitting on the side of a flat bed without using bedrails?: Total Help needed moving to and from a bed to a chair (including a wheelchair)?: Total Help needed standing up from a chair using your arms (e.g., wheelchair or bedside chair)?: Total Help needed to walk in hospital room?: Total Help needed climbing 3-5 steps with a railing? : Total 6 Click Score: 7    End of Session Equipment Utilized During Treatment: Gait belt Activity Tolerance: Patient tolerated treatment well Patient left: in chair;with call bell/phone within reach;with chair alarm set Nurse Communication: Mobility status PT Visit Diagnosis: Other abnormalities of gait and mobility (R26.89);Muscle weakness (generalized) (M62.81);Other symptoms and signs involving the nervous system (L79.892)     Time: 1194-1740 PT Time Calculation (min) (ACUTE ONLY): 52 min  Charges:                        [employer], SPT

## 2021-02-23 NOTE — Progress Notes (Signed)
PROGRESS NOTE    Jeremy Browning  LPF:[id number] DOB: [birth year] DOA: 12/25/2020 PCP: No primary care provider on file.    Brief Narrative:  Jeremy Browning is an 60 y.o. male with obesity, DM2, HTN, combined CHF who was admitted on 12/25/2020 with respiratory distress.  He was intubated in the ER also had VT requiring epi/cardioversion.  Hospital course complicated by persistent vent dependent respiratory failure eventually requiring tracheostomy, anoxic encephalopathy. Patient had coronary angiogram performed 5/7, reviewed and normal coronary arteries.  Echocardiogram showed ejection fraction 45 to 50% with grade 1 diastolic dysfunction.   Patient since has improved.  [name] is removed.  Patient is able to start eating, also receiving tube feeding.  Currently no discharge option.   Assessment & Plan:   Active Problems:   Cardiogenic shock (HCC)   Cardiac arrest with successful resuscitation (HCC)   Flash pulmonary edema (HCC)   Cardiopulmonary arrest with successful resuscitation (HCC)   Acute respiratory failure with hypoxia and hypercapnia (HCC)   Acute renal failure (HCC)   Transaminitis   Second degree heart block   Lactic acidosis   Aspiration pneumonia (HCC)   HFrEF (heart failure with reduced ejection fraction) (HCC)   Severe hypoxic-ischemic encephalopathy   SOB (shortness of breath)   Oropharyngeal dysphagia   Pressure injury of skin   Right knee DJD   Essential hypertension   Acute on chronic diastolic CHF (congestive heart failure) (HCC)   Proteus pneumonia (HCC)  Patient is currently stable.  Appetite improving.  RD is reducing nocturnal tube feeding based on daytime calorie intake. He is continued on PT/OT.  He need nursing home placement, but currently no acceptance.  He may have to continue PT/OT in the hospital until he is ready to discharge home. Blood pressure is running high today, increase losartan to 100 mg daily.   DVT prophylaxis: Lovenox Code Status:  DNR Family Communication:  Disposition Plan:    Status is: Inpatient  Remains inpatient appropriate because:Unsafe d/c plan  Dispo: The patient is from: Home              Anticipated d/c is to: Uncertain              Patient currently is medically stable to d/c.   Difficult to place patient Yes   I/O last 3 completed shifts: In: 1590 [P.O.:1440; Other:150] Out: 2600 [Urine:2600] No intake/output data recorded.     Consultants:  None  Procedures: Trach/Peg  Antimicrobials: None  Subjective: Eating ok.  No dyspnea.  Working with therapy and making progress.   Objective: Vitals:   02/23/21 1133 02/23/21 1437 02/23/21 1602 02/23/21 1940  BP: (!) 145/63 (!) 125/45 (!) 148/60 (!) 134/56  Pulse: 64 68 67 69  Resp: 18  18 16   Temp: 98.6 F (37 C)  99.2 F (37.3 C) 99 F (37.2 C)  TempSrc: Oral  Oral Oral  SpO2: 100%  94% 100%  Weight:      Height:        Intake/Output Summary (Last 24 hours) at 02/23/2021 1950 Last data filed at 02/23/2021 1500 Gross per 24 hour  Intake 630 ml  Output 1000 ml  Net -370 ml   Filed Weights   02/21/21 0500 02/21/21 0758 02/23/21 0333  Weight: 110.9 kg 110.9 kg 114.2 kg    Examination:  Constitutional: NAD, AAOx3 HEENT: conjunctivae and lids normal, EOMI CV: No cyanosis.   RESP: normal respiratory effort, on RA GI: PEG tube present Extremities: general edema in BLE  SKIN: warm, dry Neuro: II - XII grossly intact.   Psych: Normal mood and affect.  Appropriate judgement and reason   Data Reviewed: I have personally reviewed following labs and imaging studies  CBC: Recent Labs  Lab 02/18/21 0458  WBC 15.3*  HGB 9.8*  HCT 31.6*  MCV 80.4  PLT 176   Basic Metabolic Panel: Recent Labs  Lab 02/18/21 0458  NA 136  K 4.2  CL 105  CO2 25  GLUCOSE 118*  BUN 39*  CREATININE 1.06  CALCIUM 8.7*   GFR: Estimated Creatinine Clearance: 95.4 mL/min (by C-G formula based on SCr of 1.06 mg/dL). Liver Function Tests: No  results for input(s): AST, ALT, ALKPHOS, BILITOT, PROT, ALBUMIN in the last 168 hours. No results for input(s): LIPASE, AMYLASE in the last 168 hours. No results for input(s): AMMONIA in the last 168 hours. Coagulation Profile: No results for input(s): INR, PROTIME in the last 168 hours. Cardiac Enzymes: No results for input(s): CKTOTAL, CKMB, CKMBINDEX, TROPONINI in the last 168 hours. BNP (last 3 results) No results for input(s): PROBNP in the last 8760 hours. HbA1C: No results for input(s): HGBA1C in the last 72 hours. CBG: Recent Labs  Lab 02/22/21 2042 02/23/21 0520 02/23/21 0722 02/23/21 1133 02/23/21 1606  GLUCAP 201* 146* 121* 163* 161*   Lipid Profile: No results for input(s): CHOL, HDL, LDLCALC, TRIG, CHOLHDL, LDLDIRECT in the last 72 hours. Thyroid Function Tests: No results for input(s): TSH, T4TOTAL, FREET4, T3FREE, THYROIDAB in the last 72 hours. Anemia Panel: No results for input(s): VITAMINB12, FOLATE, FERRITIN, TIBC, IRON, RETICCTPCT in the last 72 hours. Sepsis Labs: No results for input(s): PROCALCITON, LATICACIDVEN in the last 168 hours.  No results found for this or any previous visit (from the past 240 hour(s)).        Radiology Studies: No results found.      Scheduled Meds:  aspirin  81 mg Per Tube Daily   carvedilol  12.5 mg Per Tube BID WC   Chlorhexidine Gluconate Cloth  6 each Topical Q0600   diclofenac Sodium  2 g Topical QID   docusate  100 mg Per Tube BID   enoxaparin (LOVENOX) injection  0.5 mg/kg Subcutaneous Q24H   famotidine  20 mg Per Tube Daily   feeding supplement (PROSource TF)  90 mL Per Tube QHS   free water  150 mL Per Tube Q4H   hydrALAZINE  50 mg Per Tube TID   insulin aspart  0-15 Units Subcutaneous TID AC & HS   losartan  100 mg Oral Daily   mouth rinse  15 mL Mouth Rinse BID   neomycin-bacitracin-polymyxin   Topical BID   polyethylene glycol  17 g Per Tube Daily   predniSONE  40 mg Per Tube Q breakfast    Ensure Max Protein  11 oz Oral BID PC   Continuous Infusions:  feeding supplement (NEPRO CARB STEADY) 1,000 mL (02/20/21 2024)     LOS: 60 days    Darlin Priestly, MD [facility]   To contact the attending provider between 7A-7P or the covering provider during after hours 7P-7A, please log into the web site [email or web address] and access using universal [place] password for that web site. If you do not have the password, please call the hospital operator.  02/23/2021, 7:50 PM

## 2021-02-23 NOTE — Progress Notes (Signed)
Nutrition Follow Up Note   DOCUMENTATION CODES:  Obesity unspecified  INTERVENTION:  Continue current diet as ordered. Advance as able per SLP Nursing to assist with meal set up and feeding if needed per SLP recommendations. Ensure Max po BID, each supplement provides 150 kcal and 30 grams of protein.  Continue nocturnal feeds of Nepro 1.8 to 70 ml/hr x 10 hours (2000-0600) for total volume of 700 mL Free water flushes to 162m q4 hours  Prosource TF 967m(2 packets) daily to provide an additional 80kcal and 22g of protein Regimen provides 1319 kcal/day, 77 g/day protein and 1409 mL/day free water  NUTRITION DIAGNOSIS:  Inadequate oral intake related to inability to eat (pt sedated and ventilated) as evidenced by NPO status.  GOAL:  Patient will meet greater than or equal to 90% of their needs  - 50% met with tube feeds, pt consuming meals and supplements  MONITOR:  Labs, Weight trends, TF tolerance, Skin, I & O's  ASSESSMENT:  89.o. black male with diabetes mellitus type II, hypertension and congestive heart failure who [facility] for cardiogenic shock and flash pulmonary edema  Pt s/p PEG tube 5/24  Pt s/p tracheostomy 5/25  Pt seen by SLP and placed on a dysphagia 1 diet 6/22. RD switched pt to nocturnal feeds 6/23 to try and encourage increased oral intake during the day. Rate was meeting 100% of needs through TF at that time. Calorie count completed 6/30-7/1 and determined pt was meeting ~50% of kcal needs and ~30% protein needs through meals. Adjusted nocturnal feeds to provide pt with ~50% of nutrition needs and decrease feeling of fullness reported by RN. SLP working with pt consistently, eats well when receives 1:1 feeds.   Would not recommend discontinuing TF until pt can consistently meet greater than >85% of estimated needs orally as pt has lost a significant amount of weight since admission. Weight appears stable at this time.   Pt will be able  to discharge to SNF with PEG tube and can be weaned from nocturnal feeds as appropriate.  Pt resting in bed at the time of assessment. Lunch tray at bedside, ~50% consumed but pt reports he is done. Pt did not realize he had pineapple on his tray, turned plate so pt could visualize the rest of his food and he continued eating. Reports he likes the magic cup. ~75% consumed, pt reports he wasn't able to get the rest of the item out of the container but doesn't want to finish it now.   Pt would benefit from nursing staff checking on pt a few times during meal to rotate his plate, as previously noted by SLP.   Discussed supplement intake with RN, states pt drinks his ensure consistently.   Will order another calorie count next week to determine if intake has improved to the point that TF could be further reduced or modified.   Average Meal Intake: 6/23-6/29: 52% intake x 10 recorded meals (25-100%) 6/30-7/6: 62% intake x 15 recorded meals (25-100%)  Nutritionally Relevant Medications: Scheduled Meds:  docusate  100 mg Per Tube BID   famotidine  20 mg Per Tube Daily   feeding supplement (PROSource TF)  90 mL Per Tube QHS   free water  150 mL Per Tube Q4H   insulin aspart  0-15 Units Subcutaneous TID AC & HS   polyethylene glycol  17 g Per Tube Daily   predniSONE  40 mg Per Tube Q breakfast  Ensure Max Protein  11 oz Oral BID PC   Continuous Infusions:  feeding supplement (NEPRO CARB STEADY) 1,000 mL (02/20/21 2024)   PRN Meds: docusate, polyethylene glycol  Labs Reviewed: SBG ranges from 121-146 mg/dL over the last 24 hours  Diet Order:   Diet Order             DIET - DYS 1 Room service appropriate? Yes; Fluid consistency: Thin  Diet effective now                  EDUCATION NEEDS:  No education needs have been identified at this time  Skin:  Skin Assessment: Skin Integrity Issues: Skin Integrity Issues:: Stage II Stage II: butocks, incision neck s/p trach   Last BM:   7/5 - type 6  Height:  Ht Readings from Last 1 Encounters:  02/05/21 6' (1.829 m)   Weight:  Wt Readings from Last 1 Encounters:  02/23/21 114.2 kg   BMI:  Body mass index is 34.15 kg/m.  Estimated Nutritional Needs:  Kcal:  2500-2800 kcal/d Protein:  125-140g/day Fluid:  2.4-2.7L/day  Ranell Patrick, RD, LDN Clinical Dietitian Pager on [place]

## 2021-02-24 DIAGNOSIS — R5381 Other malaise: Secondary | ICD-10-CM | POA: Diagnosis not present

## 2021-02-24 LAB — GLUCOSE, CAPILLARY
Glucose-Capillary: 106 mg/dL — ABNORMAL HIGH (ref 70–99)
Glucose-Capillary: 117 mg/dL — ABNORMAL HIGH (ref 70–99)
Glucose-Capillary: 128 mg/dL — ABNORMAL HIGH (ref 70–99)
Glucose-Capillary: 145 mg/dL — ABNORMAL HIGH (ref 70–99)
Glucose-Capillary: 161 mg/dL — ABNORMAL HIGH (ref 70–99)

## 2021-02-24 LAB — CBC
HCT: 33.9 % — ABNORMAL LOW (ref 39.0–52.0)
Hemoglobin: 10.1 g/dL — ABNORMAL LOW (ref 13.0–17.0)
MCH: 24.5 pg — ABNORMAL LOW (ref 26.0–34.0)
MCHC: 29.8 g/dL — ABNORMAL LOW (ref 30.0–36.0)
MCV: 82.1 fL (ref 80.0–100.0)
Platelets: 129 10*3/uL — ABNORMAL LOW (ref 150–400)
RBC: 4.13 MIL/uL — ABNORMAL LOW (ref 4.22–5.81)
RDW: 17.4 % — ABNORMAL HIGH (ref 11.5–15.5)
WBC: 12.8 10*3/uL — ABNORMAL HIGH (ref 4.0–10.5)
nRBC: 0 % (ref 0.0–0.2)

## 2021-02-24 NOTE — Progress Notes (Signed)
PROGRESS NOTE    Jeremy Browning  ZES:[id number] DOB: [birth year] DOA: 12/25/2020 PCP: No primary care provider on file.    Brief Narrative:  Jeremy Browning is an 60 y.o. male with obesity, DM2, HTN, combined CHF who was admitted on 12/25/2020 with respiratory distress.  He was intubated in the ER also had VT requiring epi/cardioversion.  Hospital course complicated by persistent vent dependent respiratory failure eventually requiring tracheostomy, anoxic encephalopathy. Patient had coronary angiogram performed 5/7, reviewed and normal coronary arteries.  Echocardiogram showed ejection fraction 45 to 50% with grade 1 diastolic dysfunction.   Patient since has improved.  [name] is removed.  Patient is able to start eating, also receiving tube feeding.  Currently no discharge option.   Assessment & Plan:   Active Problems:   Cardiogenic shock (HCC)   Cardiac arrest with successful resuscitation (HCC)   Flash pulmonary edema (HCC)   Cardiopulmonary arrest with successful resuscitation (HCC)   Acute respiratory failure with hypoxia and hypercapnia (HCC)   Acute renal failure (HCC)   Transaminitis   Second degree heart block   Lactic acidosis   Aspiration pneumonia (HCC)   HFrEF (heart failure with reduced ejection fraction) (HCC)   Severe hypoxic-ischemic encephalopathy   SOB (shortness of breath)   Oropharyngeal dysphagia   Pressure injury of skin   Right knee DJD   Essential hypertension   Acute on chronic diastolic CHF (congestive heart failure) (HCC)   Proteus pneumonia (HCC)  Patient is currently stable.  Appetite improving.  RD managing nocturnal tube feeding based on daytime calorie intake. He is continued on PT/OT.  He need nursing home placement, but currently no acceptance.  He may have to continue PT/OT in the hospital until he is ready to discharge home.   DVT prophylaxis: Lovenox Code Status: DNR Family Communication:  Disposition Plan:    Status is:  Inpatient  Remains inpatient appropriate because:Unsafe d/c plan  Dispo: The patient is from: Home              Anticipated d/c is to: Uncertain              Patient currently is medically stable to d/c.   Difficult to place patient Yes   I/O last 3 completed shifts: In: 1020 [P.O.:720; Other:300] Out: 2400 [Urine:2400] Total I/O In: 600 [P.O.:600] Out: 1825 [Urine:1825]     Consultants:  None  Procedures: Trach/Peg  Antimicrobials: None  Subjective: No new complaints.  Slowly making progress with therapy.     Objective: Vitals:   02/24/21 0456 02/24/21 0722 02/24/21 1117 02/24/21 1602  BP: (!) 127/58 (!) 140/51 (!) 135/56 (!) 139/58  Pulse: (!) 52 62 61 67  Resp: 18 16 18 17   Temp: 98.5 F (36.9 C) 98.4 F (36.9 C) 98.3 F (36.8 C) 98 F (36.7 C)  TempSrc: Oral Oral Oral   SpO2: 99% 100% 99% 99%  Weight:      Height:        Intake/Output Summary (Last 24 hours) at 02/24/2021 1654 Last data filed at 02/24/2021 1547 Gross per 24 hour  Intake 990 ml  Output 3225 ml  Net -2235 ml   Filed Weights   02/21/21 0758 02/23/21 0333 02/24/21 0210  Weight: 110.9 kg 114.2 kg 112.4 kg    Examination:  Constitutional: NAD, AAOx3 HEENT: conjunctivae and lids normal, EOMI CV: No cyanosis.   RESP: normal respiratory effort, on RA Extremities: No effusions, edema in BLE SKIN: warm, dry Neuro: II - XII grossly intact.  Data Reviewed: I have personally reviewed following labs and imaging studies  CBC: Recent Labs  Lab 02/18/21 0458 02/24/21 0514  WBC 15.3* 12.8*  HGB 9.8* 10.1*  HCT 31.6* 33.9*  MCV 80.4 82.1  PLT 176 129*   Basic Metabolic Panel: Recent Labs  Lab 02/18/21 0458  NA 136  K 4.2  CL 105  CO2 25  GLUCOSE 118*  BUN 39*  CREATININE 1.06  CALCIUM 8.7*   GFR: Estimated Creatinine Clearance: 94.7 mL/min (by C-G formula based on SCr of 1.06 mg/dL). Liver Function Tests: No results for input(s): AST, ALT, ALKPHOS, BILITOT, PROT,  ALBUMIN in the last 168 hours. No results for input(s): LIPASE, AMYLASE in the last 168 hours. No results for input(s): AMMONIA in the last 168 hours. Coagulation Profile: No results for input(s): INR, PROTIME in the last 168 hours. Cardiac Enzymes: No results for input(s): CKTOTAL, CKMB, CKMBINDEX, TROPONINI in the last 168 hours. BNP (last 3 results) No results for input(s): PROBNP in the last 8760 hours. HbA1C: No results for input(s): HGBA1C in the last 72 hours. CBG: Recent Labs  Lab 02/23/21 1945 02/24/21 0001 02/24/21 0427 02/24/21 0723 02/24/21 1117  GLUCAP 157* 106* 128* 161* 117*   Lipid Profile: No results for input(s): CHOL, HDL, LDLCALC, TRIG, CHOLHDL, LDLDIRECT in the last 72 hours. Thyroid Function Tests: No results for input(s): TSH, T4TOTAL, FREET4, T3FREE, THYROIDAB in the last 72 hours. Anemia Panel: No results for input(s): VITAMINB12, FOLATE, FERRITIN, TIBC, IRON, RETICCTPCT in the last 72 hours. Sepsis Labs: No results for input(s): PROCALCITON, LATICACIDVEN in the last 168 hours.  No results found for this or any previous visit (from the past 240 hour(s)).        Radiology Studies: No results found.      Scheduled Meds:  aspirin  81 mg Per Tube Daily   carvedilol  12.5 mg Per Tube BID WC   Chlorhexidine Gluconate Cloth  6 each Topical Q0600   diclofenac Sodium  2 g Topical QID   docusate  100 mg Per Tube BID   enoxaparin (LOVENOX) injection  0.5 mg/kg Subcutaneous Q24H   famotidine  20 mg Per Tube Daily   feeding supplement (PROSource TF)  90 mL Per Tube QHS   free water  150 mL Per Tube Q4H   hydrALAZINE  50 mg Per Tube TID   insulin aspart  0-15 Units Subcutaneous TID AC & HS   losartan  100 mg Oral Daily   mouth rinse  15 mL Mouth Rinse BID   neomycin-bacitracin-polymyxin   Topical BID   polyethylene glycol  17 g Per Tube Daily   predniSONE  40 mg Per Tube Q breakfast   Ensure Max Protein  11 oz Oral BID PC   Continuous  Infusions:  feeding supplement (NEPRO CARB STEADY) 1,000 mL (02/20/21 2024)     LOS: 61 days    Darlin Priestly, MD [facility]   To contact the attending provider between 7A-7P or the covering provider during after hours 7P-7A, please log into the web site [email or web address] and access using universal [place] password for that web site. If you do not have the password, please call the hospital operator.  02/24/2021, 4:54 PM

## 2021-02-24 NOTE — Progress Notes (Signed)
Physical Therapy Treatment Patient Details Name: Jeremy Browning MRN: [id number] DOB: 07/03/1961 Today's Date: 02/24/2021    History of Present Illness Pt is a 60 yo male that presented from home with respiratory distress 12/25/2020. Underwent defibrillation and CPR, emergently intubated. During admission pt has had a cardiac cath, peg tube and tracheostomy placed, weaned from vent 5/26 AM. 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 Mildly restricted diffusion involving the cortex more diffusely over the right cerebral convexity favored to reflect an acute MCA territory infarct over global hypoxic injury given unilaterality".    PT Comments    Patient alert, agreeable to PT, continues to be very motivated. The patient demonstrated great progress towards goals this session. Supine to sit with supervision and use of bed rails. The patient was able to laterally scoot into recliner with minAx2 for safety, and remainder of session focused on standing attempts. Pt with improved anterior translation of weight, trunk extension and standing, and standing tolerance. Did fatigue with time and required bilateral knee block. Repositioned in chair with all needs in reach. The patient would benefit from further skilled PT intervention to continue to progress towards goals. Recommendation remains appropriate.       Follow Up Recommendations  CIR     Equipment Recommendations  None recommended by PT    Recommendations for Other Services       Precautions / Restrictions Precautions Precautions: Fall Precaution Comments: PEG Restrictions Weight Bearing Restrictions: No    Mobility  Bed Mobility Overal bed mobility: Needs Assistance Bed Mobility: Supine to Sit     Supine to sit: Supervision          Transfers Overall transfer level: Needs assistance Equipment used: 2 person hand held assist Transfers: Lateral/Scoot Transfers Sit to Stand: Max  assist;Mod assist;+2 physical assistance        Lateral/Scoot Transfers: Min assist;+2 physical assistance    Ambulation/Gait                 Stairs             Wheelchair Mobility    Modified Rankin (Stroke Patients Only)       Balance Overall balance assessment: Needs assistance Sitting-balance support: Feet supported;Single extremity supported Sitting balance-Leahy Scale: Good Sitting balance - Comments: Supervision for safety   Standing balance support: Single extremity supported Standing balance-Leahy Scale: Zero Standing balance comment: bilateral knee block needed                            Cognition Arousal/Alertness: Awake/alert Behavior During Therapy: WFL for tasks assessed/performed Overall Cognitive Status: Impaired/Different from baseline Area of Impairment: [place] level;Following commands;Safety/judgement               [place] Levels of Cognitive Functioning [place] Scales of Cognitive Functioning: Confused/appropriate       Following Commands: Follows one step commands consistently;Follows one step commands with increased time              Exercises      General Comments        Pertinent Vitals/Pain Pain Assessment: Faces Faces Pain Scale: Hurts a little bit Pain Location: R knee Pain Descriptors / Indicators: Grimacing Pain Intervention(s): Limited activity within patient's tolerance;Monitored during session;Repositioned    Home Living                      Prior  Function            PT Goals (current goals can now be found in the care plan section) Progress towards PT goals: Progressing toward goals    Frequency    Min 2X/week      PT Plan Current plan remains appropriate    Co-evaluation              AM-PAC PT "6 Clicks" Mobility   Outcome Measure  Help needed turning from your back to your side while in a flat bed without using bedrails?: A Lot Help needed  moving from lying on your back to sitting on the side of a flat bed without using bedrails?: A Lot Help needed moving to and from a bed to a chair (including a wheelchair)?: A Lot Help needed standing up from a chair using your arms (e.g., wheelchair or bedside chair)?: Total Help needed to walk in hospital room?: Total Help needed climbing 3-5 steps with a railing? : Total 6 Click Score: 9    End of Session Equipment Utilized During Treatment: Gait belt Activity Tolerance: Patient tolerated treatment well Patient left: in chair;with call bell/phone within reach;with chair alarm set Nurse Communication: Mobility status PT Visit Diagnosis: Other abnormalities of gait and mobility (R26.89);Muscle weakness (generalized) (M62.81);Other symptoms and signs involving the nervous system (R29.898)     Time: 3976-7341 PT Time Calculation (min) (ACUTE ONLY): 40 min  Charges:  $Therapeutic Exercise: 23-37 mins $Therapeutic Activity: 8-22 mins                     Olga Coaster PT, DPT 4:04 PM,02/24/21

## 2021-02-24 NOTE — TOC Progression Note (Signed)
Transition of Care [facility]) - Progression Note    Patient Details  Name: Jeremy Browning MRN: [id number] Date of Birth: 07-29-1961  [facility] CM/SW Contact  Maree Krabbe, LCSW Phone Number: 02/24/2021, 11:02 AM  Clinical Narrative:   CSW is continuing to seek out placement for pt. CSW will continue to present pt in rounds with team. CSW Director is also presenting pt at difficult to place meeting.     Expected Discharge Plan: Skilled Nursing Facility Barriers to Discharge: Continued Medical Work up  Expected Discharge Plan and Services Expected Discharge Plan: Skilled Nursing Facility In-house Referral: Clinical Social Work   Post Acute Care Choice: Skilled Nursing Facility                                         Social Determinants of Health (SDOH) Interventions    Readmission Risk Interventions No flowsheet data found.

## 2021-02-25 DIAGNOSIS — R5381 Other malaise: Secondary | ICD-10-CM | POA: Diagnosis not present

## 2021-02-25 LAB — GLUCOSE, CAPILLARY
Glucose-Capillary: 116 mg/dL — ABNORMAL HIGH (ref 70–99)
Glucose-Capillary: 133 mg/dL — ABNORMAL HIGH (ref 70–99)
Glucose-Capillary: 140 mg/dL — ABNORMAL HIGH (ref 70–99)
Glucose-Capillary: 164 mg/dL — ABNORMAL HIGH (ref 70–99)
Glucose-Capillary: 172 mg/dL — ABNORMAL HIGH (ref 70–99)
Glucose-Capillary: 195 mg/dL — ABNORMAL HIGH (ref 70–99)
Glucose-Capillary: 201 mg/dL — ABNORMAL HIGH (ref 70–99)

## 2021-02-25 MED ORDER — POLYETHYLENE GLYCOL 3350 17 G PO PACK
17.0000 g | PACK | Freq: Every day | ORAL | Status: DC
  Administered 2021-02-28 – 2021-03-02 (×2): 17 g via ORAL
  Filled 2021-02-25 (×4): qty 1

## 2021-02-25 MED ORDER — CARVEDILOL 12.5 MG PO TABS
12.5000 mg | ORAL_TABLET | Freq: Two times a day (BID) | ORAL | Status: DC
  Administered 2021-02-26 – 2021-03-05 (×12): 12.5 mg via ORAL
  Filled 2021-02-25 (×15): qty 1

## 2021-02-25 MED ORDER — OXYCODONE HCL 5 MG PO TABS: 5.0000 mg | ORAL_TABLET | Freq: Four times a day (QID) | ORAL | Status: DC | PRN

## 2021-02-25 MED ORDER — POLYETHYLENE GLYCOL 3350 17 G PO PACK: 17.0000 g | PACK | Freq: Every day | ORAL | Status: DC | PRN

## 2021-02-25 MED ORDER — NEPRO/CARBSTEADY PO LIQD
1000.0000 mL | ORAL | Status: DC
  Administered 2021-02-25 – 2021-03-01 (×5): 1000 mL via ORAL

## 2021-02-25 MED ORDER — DOCUSATE SODIUM 100 MG PO CAPS
100.0000 mg | ORAL_CAPSULE | Freq: Two times a day (BID) | ORAL | Status: DC
  Administered 2021-02-27 – 2021-03-08 (×14): 100 mg via ORAL
  Filled 2021-02-25 (×18): qty 1

## 2021-02-25 MED ORDER — ASPIRIN 81 MG PO CHEW
81.0000 mg | CHEWABLE_TABLET | Freq: Every day | ORAL | Status: DC
  Administered 2021-02-26 – 2021-03-05 (×8): 81 mg via ORAL
  Filled 2021-02-25 (×8): qty 1

## 2021-02-25 MED ORDER — FAMOTIDINE 20 MG PO TABS
20.0000 mg | ORAL_TABLET | Freq: Every day | ORAL | Status: DC
  Administered 2021-02-26 – 2021-03-08 (×11): 20 mg via ORAL
  Filled 2021-02-25 (×11): qty 1

## 2021-02-25 MED ORDER — HYDRALAZINE HCL 50 MG PO TABS
50.0000 mg | ORAL_TABLET | Freq: Three times a day (TID) | ORAL | Status: DC
  Administered 2021-02-26 – 2021-03-01 (×9): 50 mg via ORAL
  Filled 2021-02-25 (×9): qty 1

## 2021-02-25 MED ORDER — PREDNISONE 20 MG PO TABS
40.0000 mg | ORAL_TABLET | Freq: Every day | ORAL | Status: AC
  Administered 2021-02-26 – 2021-03-01 (×4): 40 mg via ORAL
  Filled 2021-02-25 (×4): qty 2

## 2021-02-25 MED ORDER — ACETAMINOPHEN 160 MG/5ML PO SOLN
650.0000 mg | Freq: Four times a day (QID) | ORAL | Status: DC | PRN
  Filled 2021-02-25: qty 20.3

## 2021-02-25 NOTE — Progress Notes (Signed)
SLP Cancellation Note  Patient Details Name: Jeremy Browning MRN: [id number] DOB: 01-13-1961   Cancelled treatment:       Reason Eval/Treat Not Completed: Fatigue/lethargy limiting ability to participate  Pt sleeping soundly, difficult to arouse for participation.    Alizae Bechtel B. Dreama Saa M.S., CCC-SLP, [facility] Speech-Language Pathologist Rehabilitation Services Office [phone]  [name] 02/25/2021, 3:17 PM

## 2021-02-25 NOTE — Progress Notes (Signed)
PT Cancellation Note  Patient Details Name: Jeremy Browning MRN: [id number] DOB: Nov 08, 1960   Cancelled Treatment:    Reason Eval/Treat Not Completed: Other (comment) Pt asleep and challenging to awaken, upon waking pt indicated need for BM. Bedpan placed, nursing notified of assistance when pt finished. PT to reassess as able.    [employer], SPT

## 2021-02-25 NOTE — Progress Notes (Signed)
PROGRESS NOTE    Jeremy Browning  BTD:[id number] DOB: Feb 06, 1961 DOA: 12/25/2020 PCP: No primary care provider on file.    Brief Narrative:  Jeremy Browning is an 60 y.o. male with obesity, DM2, HTN, combined CHF who was admitted on 12/25/2020 with respiratory distress.  He was intubated in the ER also had VT requiring epi/cardioversion.  Hospital course complicated by persistent vent dependent respiratory failure eventually requiring tracheostomy, anoxic encephalopathy. Patient had coronary angiogram performed 5/7, reviewed and normal coronary arteries.  Echocardiogram showed ejection fraction 45 to 50% with grade 1 diastolic dysfunction.   Patient since has improved.  [name] is removed.  Patient is able to start eating, also receiving tube feeding.  Currently no discharge option.   Assessment & Plan:   Active Problems:   Cardiogenic shock (HCC)   Cardiac arrest with successful resuscitation (HCC)   Flash pulmonary edema (HCC)   Cardiopulmonary arrest with successful resuscitation (HCC)   Acute respiratory failure with hypoxia and hypercapnia (HCC)   Acute renal failure (HCC)   Transaminitis   Second degree heart block   Lactic acidosis   Aspiration pneumonia (HCC)   HFrEF (heart failure with reduced ejection fraction) (HCC)   Severe hypoxic-ischemic encephalopathy   SOB (shortness of breath)   Oropharyngeal dysphagia   Pressure injury of skin   Right knee DJD   Essential hypertension   Acute on chronic diastolic CHF (congestive heart failure) (HCC)   Proteus pneumonia (HCC)  Patient is currently stable.  Appetite improving.  RD managing nocturnal tube feeding based on daytime calorie intake. He is continued on PT/OT.  He need nursing home placement, but currently no acceptance.  He may have to continue PT/OT in the hospital until he is ready to discharge home.   DVT prophylaxis: Lovenox Code Status: DNR Family Communication:  Disposition Plan:    Status is:  Inpatient  Remains inpatient appropriate because:Unsafe d/c plan  Dispo: The patient is from: Home              Anticipated d/c is to: Uncertain              Patient currently is medically stable to d/c.   Difficult to place patient Yes   I/O last 3 completed shifts: In: 1230 [P.O.:1080; Other:150] Out: 4825 [Urine:4825] Total I/O In: 720 [P.O.:720] Out: 700 [Urine:700]     Consultants:  None  Procedures: Trach/Peg  Antimicrobials: None  Subjective: Pt reported eating well, having normal urination and BM.     Objective: Vitals:   02/25/21 0500 02/25/21 0816 02/25/21 1202 02/25/21 1651  BP:  (!) 141/47 (!) 138/39 (!) 132/52  Pulse:  (!) 56 (!) 58 65  Resp:  18 17 16   Temp:  98.9 F (37.2 C) 97.9 F (36.6 C) 99.3 F (37.4 C)  TempSrc:  Oral  Oral  SpO2:  100% 100% 100%  Weight: 111 kg     Height:        Intake/Output Summary (Last 24 hours) at 02/25/2021 1722 Last data filed at 02/25/2021 1345 Gross per 24 hour  Intake 960 ml  Output 2300 ml  Net -1340 ml   Filed Weights   02/23/21 0333 02/24/21 0210 02/25/21 0500  Weight: 114.2 kg 112.4 kg 111 kg    Examination:  Constitutional: NAD, AAOx3 HEENT: conjunctivae and lids normal, EOMI CV: No cyanosis.   RESP: normal respiratory effort, on RA Extremities: No effusions, edema in BLE SKIN: warm, dry Neuro: II - XII grossly intact.  Psych: flat mood and affect.     Data Reviewed: I have personally reviewed following labs and imaging studies  CBC: Recent Labs  Lab 02/24/21 0514  WBC 12.8*  HGB 10.1*  HCT 33.9*  MCV 82.1  PLT 129*   Basic Metabolic Panel: No results for input(s): NA, K, CL, CO2, GLUCOSE, BUN, CREATININE, CALCIUM, MG, PHOS in the last 168 hours.  GFR: Estimated Creatinine Clearance: 94.2 mL/min (by C-G formula based on SCr of 1.06 mg/dL). Liver Function Tests: No results for input(s): AST, ALT, ALKPHOS, BILITOT, PROT, ALBUMIN in the last 168 hours. No results for input(s):  LIPASE, AMYLASE in the last 168 hours. No results for input(s): AMMONIA in the last 168 hours. Coagulation Profile: No results for input(s): INR, PROTIME in the last 168 hours. Cardiac Enzymes: No results for input(s): CKTOTAL, CKMB, CKMBINDEX, TROPONINI in the last 168 hours. BNP (last 3 results) No results for input(s): PROBNP in the last 8760 hours. HbA1C: No results for input(s): HGBA1C in the last 72 hours. CBG: Recent Labs  Lab 02/25/21 0021 02/25/21 0416 02/25/21 0821 02/25/21 1203 02/25/21 1651  GLUCAP 172* 140* 116* 133* 201*   Lipid Profile: No results for input(s): CHOL, HDL, LDLCALC, TRIG, CHOLHDL, LDLDIRECT in the last 72 hours. Thyroid Function Tests: No results for input(s): TSH, T4TOTAL, FREET4, T3FREE, THYROIDAB in the last 72 hours. Anemia Panel: No results for input(s): VITAMINB12, FOLATE, FERRITIN, TIBC, IRON, RETICCTPCT in the last 72 hours. Sepsis Labs: No results for input(s): PROCALCITON, LATICACIDVEN in the last 168 hours.  No results found for this or any previous visit (from the past 240 hour(s)).        Radiology Studies: No results found.      Scheduled Meds:  aspirin  81 mg Per Tube Daily   carvedilol  12.5 mg Per Tube BID WC   Chlorhexidine Gluconate Cloth  6 each Topical Q0600   diclofenac Sodium  2 g Topical QID   docusate  100 mg Per Tube BID   enoxaparin (LOVENOX) injection  0.5 mg/kg Subcutaneous Q24H   famotidine  20 mg Per Tube Daily   feeding supplement (PROSource TF)  90 mL Per Tube QHS   free water  150 mL Per Tube Q4H   hydrALAZINE  50 mg Per Tube TID   insulin aspart  0-15 Units Subcutaneous TID AC & HS   losartan  100 mg Oral Daily   mouth rinse  15 mL Mouth Rinse BID   neomycin-bacitracin-polymyxin   Topical BID   polyethylene glycol  17 g Per Tube Daily   predniSONE  40 mg Per Tube Q breakfast   Ensure Max Protein  11 oz Oral BID PC   Continuous Infusions:  feeding supplement (NEPRO CARB STEADY) 1,000 mL  (02/20/21 2024)     LOS: 62 days    Darlin Priestly, MD [facility]   To contact the attending provider between 7A-7P or the covering provider during after hours 7P-7A, please log into the web site [email or web address] and access using universal [place] password for that web site. If you do not have the password, please call the hospital operator.  02/25/2021, 5:22 PM

## 2021-02-25 NOTE — Progress Notes (Signed)
Occupational Therapy Treatment Patient Details Name: Jeremy Browning MRN: [id number] DOB: [birth year] Today's Date: 02/25/2021    History of present illness Pt is a 60 yo male that presented from home with respiratory distress 12/25/2020. Underwent defibrillation and CPR, emergently intubated. During admission pt has had a cardiac cath, peg tube and tracheostomy placed, weaned from vent 5/26 AM. 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 Mildly restricted diffusion involving the cortex more diffusely over the right cerebral convexity favored to reflect an acute MCA territory infarct over global hypoxic injury given unilaterality".   OT comments  Upon entering the room, pt supine in bed with RN present to give medications. OT requesting pt sit EOB to take meds. Pt needing increased encouragement for bed mobility this session. Pt performs supine <>sit with supervision and use of bed rail. Pt provided with supervision for balance on EOB. Pt standing x 3 reps with therapist blocking L knee and pt needing mod A from elevated surface. Pt standing for ~ 30 seconds before returning to bed each time. Focus on L UE NMR with wt bearing on EOB, reaching laterally, and grasp and release. Pt continues to have difficulty with releasing objects from hand but does have increased ROM in digits this session. Pt returns to supine without assistance and therapist assisting with positioning once in bed. Pt needing min multimodal cuing and increased time to initiate/process tasks. All needs within reach and bed alarm activated. Pt declined transfer to recliner chair.   Follow Up Recommendations  SNF;Supervision/Assistance - 24 hour    Equipment Recommendations  Other (comment) (defer to next venue of care)       Precautions / Restrictions Precautions Precautions: Fall Precaution Comments: PEG       Mobility Bed Mobility Overal bed mobility: Needs Assistance Bed  Mobility: Supine to Sit     Supine to sit: Supervision Sit to supine: Supervision   General bed mobility comments: min cuing    Transfers Overall transfer level: Needs assistance Equipment used: 1 person hand held assist Transfers: Sit to/from Stand Sit to Stand: From elevated surface;Mod assist         General transfer comment: Pt standing from elevated surface with mod lifting assistance and therapist facing pt with L knee blocked.    Balance Overall balance assessment: Needs assistance Sitting-balance support: Feet supported;Single extremity supported Sitting balance-Leahy Scale: Good Sitting balance - Comments: Supervision for safety   Standing balance support: Single extremity supported Standing balance-Leahy Scale: Zero Standing balance comment: L knee blocked                           ADL either performed or assessed with clinical judgement     Vision Baseline Vision/History: No visual deficits Patient Visual Report: No change from baseline            Cognition Arousal/Alertness: Awake/alert Behavior During Therapy: WFL for tasks assessed/performed Overall Cognitive Status: Impaired/Different from baseline Area of Impairment: Rancho level;Following commands;Safety/judgement               Rancho Levels of Cognitive Functioning [place] Amigos Scales of Cognitive Functioning: Confused/appropriate Orientation Level: Person;Place;Time     Following Commands: Follows one step commands consistently;Follows one step commands with increased time Safety/Judgement: Decreased awareness of deficits;Decreased awareness of safety     General Comments: Pt used calendar on wall for memory strategy to determine correct date  Pertinent Vitals/ Pain       Pain Assessment: Faces Faces Pain Scale: No hurt   Frequency  Min 2X/week        Progress Toward Goals  OT Goals(current goals can now be found in the care plan  section)  Progress towards OT goals: Progressing toward goals  Acute Rehab OT Goals Patient Stated Goal: to walk out of here OT Goal Formulation: With patient Time For Goal Achievement: 03/04/21 Potential to Achieve Goals: Fair  Plan Discharge plan remains appropriate;Frequency remains appropriate       AM-PAC OT "6 Clicks" Daily Activity     Outcome Measure   Help from another person eating meals?: A Little Help from another person taking care of personal grooming?: A Little Help from another person toileting, which includes using toliet, bedpan, or urinal?: A Lot Help from another person bathing (including washing, rinsing, drying)?: A Lot Help from another person to put on and taking off regular upper body clothing?: A Little Help from another person to put on and taking off regular lower body clothing?: A Lot 6 Click Score: 15    End of Session    OT Visit Diagnosis: Other symptoms and signs involving the nervous system (R29.898);Other symptoms and signs involving cognitive function   Activity Tolerance Patient tolerated treatment well   Patient Left in bed;with call bell/phone within reach;with bed alarm set   Nurse Communication Mobility status        Time: 1027-2536 OT Time Calculation (min): 45 min  Charges: OT General Charges $OT Visit: 1 Visit OT Treatments $Neuromuscular Re-education: 38-52 mins  Jackquline Denmark, MS, OTR/L , CBIS ascom [phone]  02/25/21, 1:16 PM

## 2021-02-26 DIAGNOSIS — R5381 Other malaise: Secondary | ICD-10-CM | POA: Diagnosis not present

## 2021-02-26 LAB — GLUCOSE, CAPILLARY
Glucose-Capillary: 159 mg/dL — ABNORMAL HIGH (ref 70–99)
Glucose-Capillary: 134 mg/dL — ABNORMAL HIGH (ref 70–99)
Glucose-Capillary: 188 mg/dL — ABNORMAL HIGH (ref 70–99)
Glucose-Capillary: 228 mg/dL — ABNORMAL HIGH (ref 70–99)

## 2021-02-26 MED ORDER — FREE WATER
30.0000 mL | Status: DC
  Administered 2021-02-26 – 2021-03-08 (×55): 30 mL

## 2021-02-26 MED ORDER — INSULIN ASPART 100 UNIT/ML IJ SOLN
0.0000 [IU] | Freq: Every day | INTRAMUSCULAR | Status: DC
  Administered 2021-02-26 – 2021-03-03 (×2): 2 [IU] via SUBCUTANEOUS
  Filled 2021-02-26 (×2): qty 1

## 2021-02-26 MED ORDER — INSULIN ASPART 100 UNIT/ML IJ SOLN
0.0000 [IU] | Freq: Three times a day (TID) | INTRAMUSCULAR | Status: DC
  Administered 2021-02-27 (×2): 3 [IU] via SUBCUTANEOUS
  Administered 2021-02-27: 2 [IU] via SUBCUTANEOUS
  Administered 2021-02-28: 3 [IU] via SUBCUTANEOUS
  Administered 2021-02-28: 2 [IU] via SUBCUTANEOUS
  Administered 2021-03-01: 5 [IU] via SUBCUTANEOUS
  Administered 2021-03-01: 2 [IU] via SUBCUTANEOUS
  Administered 2021-03-02: 3 [IU] via SUBCUTANEOUS
  Administered 2021-03-02: 2 [IU] via SUBCUTANEOUS
  Administered 2021-03-03: 5 [IU] via SUBCUTANEOUS
  Administered 2021-03-04: 3 [IU] via SUBCUTANEOUS
  Administered 2021-03-04: 2 [IU] via SUBCUTANEOUS
  Administered 2021-03-05: 3 [IU] via SUBCUTANEOUS
  Administered 2021-03-05: 5 [IU] via SUBCUTANEOUS
  Administered 2021-03-06 – 2021-03-07 (×3): 3 [IU] via SUBCUTANEOUS
  Administered 2021-03-08: 2 [IU] via SUBCUTANEOUS
  Filled 2021-02-26 (×16): qty 1

## 2021-02-26 NOTE — Progress Notes (Signed)
PROGRESS NOTE    Jeremy Browning  YNW:[id number] DOB: 24-Jan-1961 DOA: 12/25/2020 PCP: No primary care provider on file.    Brief Narrative:  Jeremy Browning is an 60 y.o. male with obesity, DM2, HTN, combined CHF who was admitted on 12/25/2020 with respiratory distress.  He was intubated in the ER also had VT requiring epi/cardioversion.  Hospital course complicated by persistent vent dependent respiratory failure eventually requiring tracheostomy, anoxic encephalopathy. Patient had coronary angiogram performed 5/7, reviewed and normal coronary arteries.  Echocardiogram showed ejection fraction 45 to 50% with grade 1 diastolic dysfunction.   Patient since has improved.  [name] is removed.  Patient is able to start eating, also receiving tube feeding.  Currently no discharge option.   Assessment & Plan:   Active Problems:   Cardiogenic shock (HCC)   Cardiac arrest with successful resuscitation (HCC)   Flash pulmonary edema (HCC)   Cardiopulmonary arrest with successful resuscitation (HCC)   Acute respiratory failure with hypoxia and hypercapnia (HCC)   Acute renal failure (HCC)   Transaminitis   Second degree heart block   Lactic acidosis   Aspiration pneumonia (HCC)   HFrEF (heart failure with reduced ejection fraction) (HCC)   Severe hypoxic-ischemic encephalopathy   SOB (shortness of breath)   Oropharyngeal dysphagia   Pressure injury of skin   Right knee DJD   Essential hypertension   Acute on chronic diastolic CHF (congestive heart failure) (HCC)   Proteus pneumonia (HCC)  Patient is currently stable.  Appetite improving.  RD managing nocturnal tube feeding based on daytime calorie intake. He is continued on PT/OT.  He need nursing home placement, but currently no acceptance.  He may have to continue PT/OT in the hospital until he is ready to discharge home.   DVT prophylaxis: Lovenox Code Status: DNR Family Communication:  Disposition Plan:    Status is:  Inpatient  Remains inpatient appropriate because:Unsafe d/c plan  Dispo: The patient is from: Home              Anticipated d/c is to: Uncertain              Patient currently is medically stable to d/c.   Difficult to place patient Yes   I/O last 3 completed shifts: In: 1200 [P.O.:1200] Out: 2200 [Urine:2200] Total I/O In: 480 [P.O.:480] Out: -      Consultants:  None  Procedures: Trach/Peg  Antimicrobials: None  Subjective: Pt was transferred out of PCU to MedSurg.    Pt said he wanted to go home, but had to be explained why he can't take care of himself at home right now.   Objective: Vitals:   02/26/21 0436 02/26/21 0848 02/26/21 1431 02/26/21 1518  BP: (!) 153/49 (!) 161/56 (!) 139/52 (!) 138/56  Pulse: (!) 55 68  63  Resp: 20 20  20   Temp: 98.7 F (37.1 C) 98 F (36.7 C)  98.7 F (37.1 C)  TempSrc: Oral Oral  Oral  SpO2: 99% 100%  100%  Weight:      Height:        Intake/Output Summary (Last 24 hours) at 02/26/2021 1721 Last data filed at 02/26/2021 1353 Gross per 24 hour  Intake 960 ml  Output 500 ml  Net 460 ml   Filed Weights   02/23/21 0333 02/24/21 0210 02/25/21 0500  Weight: 114.2 kg 112.4 kg 111 kg    Examination:  Constitutional: NAD, AAOx3 HEENT: conjunctivae and lids normal, EOMI CV: No cyanosis.   RESP: normal  respiratory effort, on RA Extremities: No effusions, edema in BLE.  Hand in splint. SKIN: warm, dry Neuro: II - XII grossly intact.   Psych: flat mood and affect.     Data Reviewed: I have personally reviewed following labs and imaging studies  CBC: Recent Labs  Lab 02/24/21 0514  WBC 12.8*  HGB 10.1*  HCT 33.9*  MCV 82.1  PLT 129*   Basic Metabolic Panel: No results for input(s): NA, K, CL, CO2, GLUCOSE, BUN, CREATININE, CALCIUM, MG, PHOS in the last 168 hours.  GFR: Estimated Creatinine Clearance: 94.2 mL/min (by C-G formula based on SCr of 1.06 mg/dL). Liver Function Tests: No results for input(s): AST,  ALT, ALKPHOS, BILITOT, PROT, ALBUMIN in the last 168 hours. No results for input(s): LIPASE, AMYLASE in the last 168 hours. No results for input(s): AMMONIA in the last 168 hours. Coagulation Profile: No results for input(s): INR, PROTIME in the last 168 hours. Cardiac Enzymes: No results for input(s): CKTOTAL, CKMB, CKMBINDEX, TROPONINI in the last 168 hours. BNP (last 3 results) No results for input(s): PROBNP in the last 8760 hours. HbA1C: No results for input(s): HGBA1C in the last 72 hours. CBG: Recent Labs  Lab 02/25/21 1203 02/25/21 1651 02/25/21 1957 02/26/21 1010 02/26/21 1201  GLUCAP 133* 201* 164* 159* 134*   Lipid Profile: No results for input(s): CHOL, HDL, LDLCALC, TRIG, CHOLHDL, LDLDIRECT in the last 72 hours. Thyroid Function Tests: No results for input(s): TSH, T4TOTAL, FREET4, T3FREE, THYROIDAB in the last 72 hours. Anemia Panel: No results for input(s): VITAMINB12, FOLATE, FERRITIN, TIBC, IRON, RETICCTPCT in the last 72 hours. Sepsis Labs: No results for input(s): PROCALCITON, LATICACIDVEN in the last 168 hours.  No results found for this or any previous visit (from the past 240 hour(s)).        Radiology Studies: No results found.      Scheduled Meds:  aspirin  81 mg Oral Daily   carvedilol  12.5 mg Oral BID WC   diclofenac Sodium  2 g Topical QID   docusate sodium  100 mg Oral BID   enoxaparin (LOVENOX) injection  0.5 mg/kg Subcutaneous Q24H   famotidine  20 mg Oral Daily   feeding supplement (PROSource TF)  90 mL Per Tube QHS   free water  150 mL Per Tube Q4H   hydrALAZINE  50 mg Oral TID   insulin aspart  0-15 Units Subcutaneous TID AC & HS   losartan  100 mg Oral Daily   mouth rinse  15 mL Mouth Rinse BID   neomycin-bacitracin-polymyxin   Topical BID   polyethylene glycol  17 g Oral Daily   predniSONE  40 mg Oral Q breakfast   Ensure Max Protein  11 oz Oral BID PC   Continuous Infusions:  feeding supplement (NEPRO CARB STEADY)  1,000 mL (02/25/21 2109)     LOS: 63 days    Darlin Priestly, MD [facility]   To contact the attending provider between 7A-7P or the covering provider during after hours 7P-7A, please log into the web site [email or web address] and access using universal [place] password for that web site. If you do not have the password, please call the hospital operator.  02/26/2021, 5:21 PM

## 2021-02-26 NOTE — Progress Notes (Signed)
Physical Therapy Treatment Patient Details Name: Jeremy Browning MRN: [id number] DOB: 18-Nov-1960 Today's Date: 02/26/2021    History of Present Illness Pt is a 60 yo male that presented from home with respiratory distress 12/25/2020. Underwent defibrillation and CPR, emergently intubated. During admission pt has had a cardiac cath, peg tube and tracheostomy placed, weaned from vent 5/26 AM. 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 Mildly restricted diffusion involving the cortex more diffusely over the right cerebral convexity favored to reflect an acute MCA territory infarct over global hypoxic injury given unilaterality".    PT Comments    Pt was long sitting in bed upon arriving. He is alert but disoriented. Was able to follow commands consistently throughout with slight impulsivity at times. Pt was agreeable to OOB to chair. Session focused on transfers. Pt was able to exit R side of bed with HOB elevated and use of bed rail without physical assistance. Once seated EOB, was able to maintain balance with supervision. Stood 3 x EOB prior to stand pivot to recliner.Pt is high fall risk with poor standing tolerance.  Highly recommend all transfers in/out of bed-chair towards R for safety. He tolerated 3 bouts of standing x ~ 15 sec with mod-max assist from elevated bed height. Vcs throughout for technique improvements, posture correction, and fwd wt shift. Recommend continued skilled PT to assist pt to PLOF while improving independence with ADLs. Applied LUE resting handsplint at conclusion of session.   Follow Up Recommendations  CIR     Equipment Recommendations  None recommended by PT       Precautions / Restrictions Precautions Precautions: Fall Precaution Comments: PEG Restrictions Weight Bearing Restrictions: No    Mobility  Bed Mobility Overal bed mobility: Needs Assistance Bed Mobility: Supine to Sit     Supine to sit:  Supervision;HOB elevated     General bed mobility comments: Heavy use of bed rails + HOB elevated. close supervision due to impulsivity.    Transfers Overall transfer level: Needs assistance Equipment used: 1 person hand held assist (RUE support on chair back.(did not have hemiwalker in room)) Transfers: Sit to/from [employer] Sit to Stand: From elevated surface;Mod assist;Max assist Stand pivot transfers: Max assist;From elevated surface       General transfer comment: Pt performed 4 trials of standing EOB(height elevated) while holding back of stationary chair. LUE HHA by author while blocking knees from buckling. Stook 3 x ~15 sec with prolonged rest between trials. Vcs for upright posture. Did require BLE knee blocking to prevent buckling. Max assist stand pivot towards R.  Ambulation/Gait      General Gait Details: unsafe/unable     Balance Overall balance assessment: Needs assistance Sitting-balance support: Feet supported;Single extremity supported Sitting balance-Leahy Scale: Good Sitting balance - Comments: Supervision for safety   Standing balance support: Single extremity supported Standing balance-Leahy Scale: Zero Standing balance comment: BLE knee blocked        Cognition Arousal/Alertness: Awake/alert Behavior During Therapy: WFL for tasks assessed/performed Overall Cognitive Status: Impaired/Different from baseline Area of Impairment: Orientation;Following commands;Safety/judgement;Awareness            General Comments: Pt is alert however disoriented. When ask what day of the week it is states" it the 9th." Was easily redirected and was able to follow commands with increased time to process. slightly impulsive at times.             Pertinent Vitals/Pain Pain Assessment:  (c/o PEG  insertion pain only. Did not rate) Pain Intervention(s): Monitored during session     PT Goals (current goals can now be found in the care plan  section) Acute Rehab PT Goals Patient Stated Goal: none stated Progress towards PT goals: Progressing toward goals    Frequency    Min 2X/week      PT Plan Current plan remains appropriate    Co-evaluation     PT goals addressed during session: Mobility/safety with mobility;Balance;Strengthening/ROM        AM-PAC PT "6 Clicks" Mobility   Outcome Measure  Help needed turning from your back to your side while in a flat bed without using bedrails?: A Little Help needed moving from lying on your back to sitting on the side of a flat bed without using bedrails?: A Little Help needed moving to and from a bed to a chair (including a wheelchair)?: A Lot Help needed standing up from a chair using your arms (e.g., wheelchair or bedside chair)?: A Lot Help needed to walk in hospital room?: Total Help needed climbing 3-5 steps with a railing? : Total 6 Click Score: 12    End of Session Equipment Utilized During Treatment: Gait belt Activity Tolerance: Patient tolerated treatment well Patient left: in chair;with call bell/phone within reach;with chair alarm set Nurse Communication: Mobility status PT Visit Diagnosis: Other abnormalities of gait and mobility (R26.89);Muscle weakness (generalized) (M62.81);Other symptoms and signs involving the nervous system (R29.898)     Time: 0254-2706 PT Time Calculation (min) (ACUTE ONLY): 38 min  Charges:  $Therapeutic Activity: 23-37 mins $Neuromuscular Re-education: 8-22 mins                     Jetta Lout PTA 02/26/21, 9:51 AM

## 2021-02-27 DIAGNOSIS — R5381 Other malaise: Secondary | ICD-10-CM | POA: Diagnosis not present

## 2021-02-27 LAB — GLUCOSE, CAPILLARY
Glucose-Capillary: 122 mg/dL — ABNORMAL HIGH (ref 70–99)
Glucose-Capillary: 171 mg/dL — ABNORMAL HIGH (ref 70–99)
Glucose-Capillary: 151 mg/dL — ABNORMAL HIGH (ref 70–99)
Glucose-Capillary: 131 mg/dL — ABNORMAL HIGH (ref 70–99)

## 2021-02-27 LAB — HEMOGLOBIN A1C
Hgb A1c MFr Bld: 7.1 % — ABNORMAL HIGH (ref 4.8–5.6)
Mean Plasma Glucose: 157.07 mg/dL

## 2021-02-27 NOTE — Progress Notes (Signed)
Physical Therapy Treatment Patient Details Name: Jeremy Browning MRN: [id number] DOB: 11-15-1960 Today's Date: 02/27/2021    History of Present Illness Pt is a 60 yo male that presented from home with respiratory distress 12/25/2020. Underwent defibrillation and CPR, emergently intubated. During admission pt has had a cardiac cath, peg tube and tracheostomy placed, weaned from vent 5/26 AM. 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 Mildly restricted diffusion involving the cortex more diffusely over the right cerebral convexity favored to reflect an acute MCA territory infarct over global hypoxic injury given unilaterality".    PT Comments    Pt seen for PT tx with pt very eager & motivated to participate. Pt is able to complete supine>sit with supervision with reliance on bed rails & HOB slightly elevated. Pt transfers sit>stand from elevated EOB with max assist, multiple sit<>stand (max standing duration of ~30 seconds) to allow nurse to perform peri hygiene dependently 2/2 incontinent BM. Pt is able to complete stand pivot bed>recliner on R with MAX assist (reviewed transfer method with nurse in room). In hallway, pt requires +2 for sit>stand at rail with pt demonstrating decreased ability to push from armrest to standing, instead pulling up on rail. Pt initially marches in place 1x each BLE then progresses to taking steps at rail. Pt is able to take ~2 steps forward before requesting to sit down. Returned to standing x 2 more times with rest breaks taken again 2/2 pt's reported need to void but unable to do so. Pt is then able to ambulate 5 ft with rail with MAX assist + 2nd person for chair follow for safety. Pt is able to advance BLE with PT providing approximation at L knee to prevent buckling. Pt demonstrates decreased stride length, decreased BLE step length, decreased weight shift to L & requires MAX cuing for upright posture. Pt does endorse some  pain in R side of abdomen and PT monitored & repositioned for increased comfort. Pt reports he feels he had several false needs to void 2/2 feeling nervous. PT provides encouragement & pt is very motivated to participate, very happy when PT reports an increase in PT frequency. Pt continues to remain an optimal CIR candidate as pt is extremely motivated to participate and would benefit from intense therapies to maximize independence in mobility & reduce fall risk.    This PT reviewed current POC & goals and updated them as appropriate, with an addition of a gait goal. Pt would also benefit from upgraded frequency to 5x/week. Will continue to follow pt acutely and progress mobility as able & per POC.   Follow Up Recommendations  CIR     Equipment Recommendations   (TBD in next venue)    Recommendations for Other Services       Precautions / Restrictions Precautions Precautions: Fall Precaution Comments: PEG Restrictions Weight Bearing Restrictions: No    Mobility  Bed Mobility Overal bed mobility: Needs Assistance Bed Mobility: Supine to Sit     Supine to sit: Supervision;HOB elevated (use of bed rails, HOB elevated)     General bed mobility comments: Pt is able to transfer from semifowler to long sitting with mod I with use of bed rail & HOB slightly elevated. Pt requires cuing to square up hips while sitting EOB    Transfers Overall transfer level: Needs assistance   Transfers: Sit to/from Stand;Stand Pivot Transfers Sit to Stand: Max assist;+2 physical assistance;From elevated surface (pt pushing to stand with RUE on  either bed rail or chair armrest) Stand pivot transfers: Max assist;From elevated surface (to recliner on R side, assistance with turning & ensuring pt sits in chair)          Ambulation/Gait Ambulation/Gait assistance: Max assist;+2 safety/equipment Gait Distance (Feet): 1 Feet (+ 5 ft) Assistive device:  (rail in hallway) Gait Pattern/deviations:  Decreased step length - right;Decreased step length - left;Decreased dorsiflexion - left;Decreased stride length;Decreased weight shift to left;Decreased stance time - left Gait velocity: decreased   General Gait Details: cuing for upright posture   Stairs             Wheelchair Mobility    Modified Rankin (Stroke Patients Only)       Balance Overall balance assessment: Needs assistance Sitting-balance support: Feet supported Sitting balance-Leahy Scale: Good Sitting balance - Comments: Supervision for safety     Standing balance-Leahy Scale: Zero Standing balance comment: RUE support on RW                            Cognition Arousal/Alertness: Awake/alert Behavior During Therapy: WFL for tasks assessed/performed Overall Cognitive Status: Impaired/Different from baseline Area of Impairment: Orientation;Following commands;Safety/judgement;Awareness;Attention;Memory                       Following Commands: Follows one step commands consistently;Follows one step commands with increased time (requires increased time to follow simple commands, sometimes requires visual & tactile cuing) Safety/Judgement: Decreased awareness of deficits;Decreased awareness of safety            Exercises      General Comments General comments (skin integrity, edema, etc.): HR 77 bpm, SpO2 99% sitting in recliner at end of session      Pertinent Vitals/Pain Pain Assessment: Faces Faces Pain Scale: Hurts whole lot Pain Location: R side of abdomen Pain Descriptors / Indicators: Grimacing Pain Intervention(s): Monitored during session;Repositioned;Limited activity within patient's tolerance    Home Living                      Prior Function            PT Goals (current goals can now be found in the care plan section) Acute Rehab PT Goals Patient Stated Goal: get better, work with therapy PT Goal Formulation: With patient Time For Goal  Achievement: 03/13/21 Potential to Achieve Goals: Good Additional Goals Additional Goal #1: patient will follow 1 out of 3 of single step commands during one session in prpearation for progression of functional mobility Additional Goal #2: family will be able to perform PROM exercises to maintain pt joint integrity, skin protection, and improve ease of bed mobility of pt Progress towards PT goals: Progressing toward goals    Frequency    Min 5X/week      PT Plan Current plan remains appropriate    Co-evaluation              AM-PAC PT "6 Clicks" Mobility   Outcome Measure  Help needed turning from your back to your side while in a flat bed without using bedrails?: A Little Help needed moving from lying on your back to sitting on the side of a flat bed without using bedrails?: A Little Help needed moving to and from a bed to a chair (including a wheelchair)?: A Lot Help needed standing up from a chair using your arms (e.g., wheelchair or bedside chair)?: A Lot Help needed to  walk in hospital room?: Total Help needed climbing 3-5 steps with a railing? : Total 6 Click Score: 12    End of Session Equipment Utilized During Treatment: Gait belt (placed above PEG tube) Activity Tolerance: Patient tolerated treatment well (pt extremely motivated & eager to participate) Patient left: in chair;with call bell/phone within reach;with chair alarm set (set up with meal tray, reviewed use of cal bell) Nurse Communication: Mobility status PT Visit Diagnosis: Other abnormalities of gait and mobility (R26.89);Muscle weakness (generalized) (M62.81);Other symptoms and signs involving the nervous system (R29.898);Hemiplegia and hemiparesis Hemiplegia - Right/Left: Left Hemiplegia - dominant/non-dominant: Non-dominant Hemiplegia - caused by: Cerebral infarction     Time: 0825-0905 PT Time Calculation (min) (ACUTE ONLY): 40 min  Charges:  $Therapeutic Activity: 8-22 mins $Neuromuscular  Re-education: 23-37 mins                     Aleda Grana, PT, DPT 02/27/21, 9:41 AM    Sandi Mariscal 02/27/2021, 9:36 AM

## 2021-02-27 NOTE — Progress Notes (Signed)
PROGRESS NOTE    Jeremy Browning  PJK:[id number] DOB: [birth year] DOA: 12/25/2020 PCP: No primary care provider on file.    Brief Narrative:  Jeremy Browning is an 60 y.o. male with obesity, DM2, HTN, combined CHF who was admitted on 12/25/2020 with respiratory distress.  He was intubated in the ER also had VT requiring epi/cardioversion.  Hospital course complicated by persistent vent dependent respiratory failure eventually requiring tracheostomy, anoxic encephalopathy. Patient had coronary angiogram performed 5/7, reviewed and normal coronary arteries.  Echocardiogram showed ejection fraction 45 to 50% with grade 1 diastolic dysfunction.   Patient since has improved.  [name] is removed.  Patient is able to start eating, also receiving tube feeding.  Currently no discharge option.   Assessment & Plan:   Active Problems:   Cardiogenic shock (HCC)   Cardiac arrest with successful resuscitation (HCC)   Flash pulmonary edema (HCC)   Cardiopulmonary arrest with successful resuscitation (HCC)   Acute respiratory failure with hypoxia and hypercapnia (HCC)   Acute renal failure (HCC)   Transaminitis   Second degree heart block   Lactic acidosis   Aspiration pneumonia (HCC)   HFrEF (heart failure with reduced ejection fraction) (HCC)   Severe hypoxic-ischemic encephalopathy   SOB (shortness of breath)   Oropharyngeal dysphagia   Pressure injury of skin   Right knee DJD   Essential hypertension   Acute on chronic diastolic CHF (congestive heart failure) (HCC)   Proteus pneumonia (HCC)  Patient is currently stable.  Appetite improving.  RD managing nocturnal tube feeding based on daytime calorie intake. He is continued on PT/OT.  He need nursing home placement, but currently no acceptance.  He may have to continue PT/OT in the hospital until he is ready to discharge home.   DVT prophylaxis: Lovenox Code Status: DNR Family Communication:  Disposition Plan:    Status is:  Inpatient  Remains inpatient appropriate because:Unsafe d/c plan  Dispo: The patient is from: Home              Anticipated d/c is to: Uncertain              Patient currently is medically stable to d/c.   Difficult to place patient Yes   I/O last 3 completed shifts: In: 1540 [P.O.:840; NG/GT:700] Out: 1150 [Urine:1150] Total I/O In: 1540 [P.O.:840; NG/GT:700] Out: -      Consultants:  None  Procedures: Trach/Peg  Antimicrobials: None  Subjective: No new complaints.  Pt having good oral intake, per nursing.  Worked with PT.   Objective: Vitals:   02/26/21 2012 02/27/21 0511 02/27/21 0813 02/27/21 1342  BP: (!) 138/57 (!) 134/49 (!) 134/58 (!) 154/68  Pulse: 74 65 (!) 56 76  Resp: 20 20 16    Temp: 98.4 F (36.9 C) 98.6 F (37 C) 98.6 F (37 C) 98.5 F (36.9 C)  TempSrc: Oral Oral Oral Oral  SpO2: 100% 98% 100% 100%  Weight:  110.7 kg    Height:        Intake/Output Summary (Last 24 hours) at 02/27/2021 1425 Last data filed at 02/27/2021 1403 Gross per 24 hour  Intake 1900 ml  Output 650 ml  Net 1250 ml   Filed Weights   02/24/21 0210 02/25/21 0500 02/27/21 0511  Weight: 112.4 kg 111 kg 110.7 kg    Examination:  Constitutional: NAD, sleeping CV: No cyanosis.   RESP: normal respiratory effort, on RA SKIN: warm, dry Neuro: II - XII grossly intact.     Data Reviewed:  I have personally reviewed following labs and imaging studies  CBC: Recent Labs  Lab 02/24/21 0514  WBC 12.8*  HGB 10.1*  HCT 33.9*  MCV 82.1  PLT 129*   Basic Metabolic Panel: No results for input(s): NA, K, CL, CO2, GLUCOSE, BUN, CREATININE, CALCIUM, MG, PHOS in the last 168 hours.  GFR: Estimated Creatinine Clearance: 94 mL/min (by C-G formula based on SCr of 1.06 mg/dL). Liver Function Tests: No results for input(s): AST, ALT, ALKPHOS, BILITOT, PROT, ALBUMIN in the last 168 hours. No results for input(s): LIPASE, AMYLASE in the last 168 hours. No results for input(s):  AMMONIA in the last 168 hours. Coagulation Profile: No results for input(s): INR, PROTIME in the last 168 hours. Cardiac Enzymes: No results for input(s): CKTOTAL, CKMB, CKMBINDEX, TROPONINI in the last 168 hours. BNP (last 3 results) No results for input(s): PROBNP in the last 8760 hours. HbA1C: Recent Labs    02/26/21 1846  HGBA1C 7.1*   CBG: Recent Labs  Lab 02/26/21 1201 02/26/21 1828 02/26/21 1958 02/27/21 0809 02/27/21 1135  GLUCAP 134* 188* 228* 122* 171*   Lipid Profile: No results for input(s): CHOL, HDL, LDLCALC, TRIG, CHOLHDL, LDLDIRECT in the last 72 hours. Thyroid Function Tests: No results for input(s): TSH, T4TOTAL, FREET4, T3FREE, THYROIDAB in the last 72 hours. Anemia Panel: No results for input(s): VITAMINB12, FOLATE, FERRITIN, TIBC, IRON, RETICCTPCT in the last 72 hours. Sepsis Labs: No results for input(s): PROCALCITON, LATICACIDVEN in the last 168 hours.  No results found for this or any previous visit (from the past 240 hour(s)).        Radiology Studies: No results found.      Scheduled Meds:  aspirin  81 mg Oral Daily   carvedilol  12.5 mg Oral BID WC   diclofenac Sodium  2 g Topical QID   docusate sodium  100 mg Oral BID   enoxaparin (LOVENOX) injection  0.5 mg/kg Subcutaneous Q24H   famotidine  20 mg Oral Daily   feeding supplement (PROSource TF)  90 mL Per Tube QHS   free water  30 mL Per Tube Q4H   hydrALAZINE  50 mg Oral TID   insulin aspart  0-15 Units Subcutaneous TID WC   insulin aspart  0-5 Units Subcutaneous QHS   losartan  100 mg Oral Daily   mouth rinse  15 mL Mouth Rinse BID   neomycin-bacitracin-polymyxin   Topical BID   polyethylene glycol  17 g Oral Daily   predniSONE  40 mg Oral Q breakfast   Ensure Max Protein  11 oz Oral BID PC   Continuous Infusions:  feeding supplement (NEPRO CARB STEADY) Stopped (02/27/21 0736)     LOS: 64 days    Darlin Priestly, MD [facility]   To contact the attending  provider between 7A-7P or the covering provider during after hours 7P-7A, please log into the web site [email or web address] and access using universal [place] password for that web site. If you do not have the password, please call the hospital operator.  02/27/2021, 2:25 PM

## 2021-02-28 DIAGNOSIS — R5381 Other malaise: Secondary | ICD-10-CM | POA: Diagnosis not present

## 2021-02-28 LAB — GLUCOSE, CAPILLARY
Glucose-Capillary: 125 mg/dL — ABNORMAL HIGH (ref 70–99)
Glucose-Capillary: 157 mg/dL — ABNORMAL HIGH (ref 70–99)
Glucose-Capillary: 185 mg/dL — ABNORMAL HIGH (ref 70–99)
Glucose-Capillary: 145 mg/dL — ABNORMAL HIGH (ref 70–99)

## 2021-02-28 NOTE — Progress Notes (Signed)
  Speech Language Pathology Treatment: Cognitive-Linquistic  Patient Details Name: Jeremy Browning MRN: [id number] DOB: 03-04-1961 Today's Date: 02/28/2021 Time: 9767-3419 SLP Time Calculation (min) (ACUTE ONLY): 10 min  Assessment / Plan / Recommendation Clinical Impression  Pt seen for ongoing cognition treatment. Pt co-treated with OT for pt safety when standing to perform ADL.   Currently pt presents with behaviors consistent with emerging Rancho VII. With cues for use of external aid, pt able to locate DOW, MOY, year, and date with moderate cues. Pt continues to demonstrate great selective attention to basic verbal and functional tasks in a moderately distracting environment for > 30 minutes. He is able to follow basic verbal and functional directions at the 2-step level.   Pt continues progressing towards goals with discharge plan changed from SNF to CIR.     HPI HPI: Pt is a 60 yo male that presented from home with respiratory distress 12/25/2020. Underwent defibrillation and CPR, emergently intubated. During admission pt has had a cardiac cath, peg tube and tracheostomy placed, weaned from vent 01/13/2021. 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 Mildly restricted diffusion involving the cortex more diffusely over the right cerebral convexity favored to reflect an acute MCA territory infarct over global hypoxic injury given unilaterality".  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 Pt currently has a #6 cuffless Shiley with PMSV worn during all waking hours.      SLP Plan  Continue with current plan of care       Recommendations                   General recommendations: Rehab consult Oral Care Recommendations: Oral care BID Follow up Recommendations: Inpatient Rehab SLP Visit Diagnosis:  Cognitive communication deficit (R41.841);Dysphagia, oral phase (R13.11) Plan: Continue with current plan of care       GO               Jeremy Browning B. Jeremy Browning M.S., CCC-SLP, [facility] Speech-Language Pathologist Rehabilitation Services Office [phone]  Jeremy Browning 02/28/2021, 12:11 PM

## 2021-02-28 NOTE — TOC Progression Note (Addendum)
Transition of Care [facility]) - Progression Note    Patient Details  Name: Jeremy Browning MRN: [id number] Date of Birth: 12/26/1960  [facility] CM/SW Contact  Chapman Fitch, RN Phone Number: 02/28/2021, 2:36 PM  Clinical Narrative:     Cecille Amsterdam, admissions coordinator does not know if they can accept healthy blue she is to discuss with DON and get back with me - [facility] - Vm left -[facility] - VM left - [facility] - unable to take a long term care patient - [facility] - can not accept insurance  - [facility] - [facility] - to review- resent in HUB - [facility] - unable to take healthy blue   Expected Discharge Plan: Skilled Nursing Facility Barriers to Discharge: Continued Medical Work up  Expected Discharge Plan and Services Expected Discharge Plan: Skilled Nursing Facility In-house Referral: Clinical Social Work   Post Acute Care Choice: Skilled Nursing Facility                                         Social Determinants of Health (SDOH) Interventions    Readmission Risk Interventions No flowsheet data found.

## 2021-02-28 NOTE — Progress Notes (Signed)
Occupational Therapy Treatment Patient Details Name: Jeremy Browning MRN: [id number] DOB: September 15, 1960 Today's Date: 02/28/2021    History of present illness Pt is a 60 yo male that presented from home with respiratory distress 12/25/2020. Underwent defibrillation and CPR, emergently intubated. During admission pt has had a cardiac cath, peg tube and tracheostomy placed, weaned from vent 5/26 AM. 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 Mildly restricted diffusion involving the cortex more diffusely over the right cerebral convexity favored to reflect an acute MCA territory infarct over global hypoxic injury given unilaterality".   OT comments  Mr Born was seen for OT treatment on this date. Upon arrival to room pt reclined in bed, perseverative on lack of family support - pt re-oriented to family having visited pt. Pt agreeable to OOB to chair. Requires MAX A sit<>stand from elevated bed and MAX A for bed>chair SPT. Mid transfer pt reports having BM, completed t/f safely then SLP in room to assist. MAX A x2 for sit<>stand from low chair height and NT in room to assist with perihygiene and don/doff brief standing EOC. Pt tolerated sitting EOC with LUE support on recliner for ~15 mins of sketching. Pt demonstrates improved grasp with minimal tremors this session. Pt making good progress toward goals. Pt continues to benefit from skilled OT services to maximize return to PLOF and minimize risk of future falls, injury, caregiver burden, and readmission. Will continue to follow POC. Discharge recommendation remains appropriate.    Follow Up Recommendations  SNF;Supervision/Assistance - 24 hour    Equipment Recommendations  Other (comment) (TBD)    Recommendations for Other Services      Precautions / Restrictions Precautions Precautions: Fall Precaution Comments: PEG Restrictions Weight Bearing Restrictions: No       Mobility Bed  Mobility Overal bed mobility: Needs Assistance Bed Mobility: Supine to Sit     Supine to sit: Supervision;HOB elevated          Transfers Overall transfer level: Needs assistance   Transfers: Sit to/from Stand;Stand Pivot Transfers Sit to Stand: Max assist;From elevated surface Stand pivot transfers: Max assist;From elevated surface       General transfer comment: MAX A x2 sit<>stand from low chair height    Balance Overall balance assessment: Needs assistance Sitting-balance support: Feet supported Sitting balance-Leahy Scale: Good Sitting balance - Comments: Supervision for safety   Standing balance support: Bilateral upper extremity supported Standing balance-Leahy Scale: Poor                             ADL either performed or assessed with clinical judgement   ADL Overall ADL's : Needs assistance/impaired                                       General ADL Comments: MAX A x3 for perihygiene and don/doff brief standing EOC. MAX A for ADL t/f      Cognition Arousal/Alertness: Awake/alert Behavior During Therapy: WFL for tasks assessed/performed Overall Cognitive Status: Impaired/Different from baseline Area of Impairment: Orientation;Following commands;Safety/judgement;Awareness;Attention;Memory               Rancho Levels of Cognitive Functioning Rancho Los Amigos Scales of Cognitive Functioning: Confused/appropriate (emerging VII) Orientation Level: Person;Place;Time   Memory: Decreased short-term memory Following Commands: Follows one step commands consistently;Follows one step commands with increased time Safety/Judgement:  Decreased awareness of deficits;Decreased awareness of safety              Exercises Exercises: Other exercises Other Exercises Other Exercises: Pt educated re: falls prevention, toileting mgmt, HEP Other Exercises: LBD, sup>sit, sit<>stand x3, SPT, toileting, sitting/standing balance/tolerance,  drawing           Pertinent Vitals/ Pain       Pain Assessment: No/denies pain   Frequency  Min 2X/week        Progress Toward Goals  OT Goals(current goals can now be found in the care plan section)  Progress towards OT goals: Progressing toward goals  Acute Rehab OT Goals Patient Stated Goal: get better, work with therapy OT Goal Formulation: With patient Time For Goal Achievement: 03/04/21 Potential to Achieve Goals: Fair ADL Goals Pt Will Perform Grooming: sitting;with modified independence Pt Will Perform Upper Body Dressing: with supervision;sitting Pt Will Transfer to Toilet: with max assist;bedside commode Additional ADL Goal #1: Pt's care team will demo competency in b/l resting hand splint wear schedule and donning/doffing with no cues Additional ADL Goal #2: Pt will complete 1-2 step ADL task with MOD A Additional ADL Goal #3: Pt will experience at least 30% reduction of b/l UE edema through manual techniques as evidenced by no pitting in hands.  Plan Discharge plan remains appropriate;Frequency remains appropriate    Co-evaluation      Reason for Co-Treatment: Complexity of the patient's impairments (multi-system involvement);For patient/therapist safety;To address functional/ADL transfers     SLP goals addressed during session: Cognition    AM-PAC OT "6 Clicks" Daily Activity     Outcome Measure   Help from another person eating meals?: A Little Help from another person taking care of personal grooming?: A Little Help from another person toileting, which includes using toliet, bedpan, or urinal?: A Lot Help from another person bathing (including washing, rinsing, drying)?: A Lot Help from another person to put on and taking off regular upper body clothing?: A Little Help from another person to put on and taking off regular lower body clothing?: A Lot 6 Click Score: 15    End of Session    OT Visit Diagnosis: Other symptoms and signs involving  the nervous system (R29.898);Other symptoms and signs involving cognitive function   Activity Tolerance Patient tolerated treatment well   Patient Left in chair;with call bell/phone within reach;with chair alarm set   Nurse Communication Mobility status        Time: 6301-6010 OT Time Calculation (min): 58 min  Charges: OT General Charges $OT Visit: 1 Visit OT Treatments $Self Care/Home Management : 23-37 mins $Therapeutic Activity: 8-22 mins  Kathie Dike, M.S. OTR/L  02/28/21, 1:48 PM  ascom [phone]

## 2021-02-28 NOTE — Progress Notes (Signed)
PROGRESS NOTE    Jeremy Browning  WUJ:[id number] DOB: Sep 27, 1960 DOA: 12/25/2020 PCP: No primary care provider on file.    Brief Narrative:  Jeremy Browning is an 60 y.o. male with obesity, DM2, HTN, combined CHF who was admitted on 12/25/2020 with respiratory distress.  He was intubated in the ER also had VT requiring epi/cardioversion.  Hospital course complicated by persistent vent dependent respiratory failure eventually requiring tracheostomy, anoxic encephalopathy. Patient had coronary angiogram performed 5/7, reviewed and normal coronary arteries.  Echocardiogram showed ejection fraction 45 to 50% with grade 1 diastolic dysfunction.   Patient since has improved.  [name] is removed.  Patient is able to start eating, also receiving tube feeding.  Currently no discharge option.   Assessment & Plan:   Active Problems:   Cardiogenic shock (HCC)   Cardiac arrest with successful resuscitation (HCC)   Flash pulmonary edema (HCC)   Cardiopulmonary arrest with successful resuscitation (HCC)   Acute respiratory failure with hypoxia and hypercapnia (HCC)   Acute renal failure (HCC)   Transaminitis   Second degree heart block   Lactic acidosis   Aspiration pneumonia (HCC)   HFrEF (heart failure with reduced ejection fraction) (HCC)   Severe hypoxic-ischemic encephalopathy   SOB (shortness of breath)   Oropharyngeal dysphagia   Pressure injury of skin   Right knee DJD   Essential hypertension   Acute on chronic diastolic CHF (congestive heart failure) (HCC)   Proteus pneumonia (HCC)  Patient is currently stable.  Appetite improving.  RD managing nocturnal tube feeding based on daytime calorie intake. He is continued on PT/OT.  He need nursing home placement, but currently no acceptance.  He may have to continue PT/OT in the hospital until he is ready to discharge home.   DVT prophylaxis: Lovenox Code Status: DNR Family Communication:  Disposition Plan:    Status is:  Inpatient  Remains inpatient appropriate because:Unsafe d/c plan  Dispo: The patient is from: Home              Anticipated d/c is to: Uncertain              Patient currently is medically stable to d/c.   Difficult to place patient Yes   I/O last 3 completed shifts: In: 1540 [P.O.:840; NG/GT:700] Out: 600 [Urine:600] Total I/O In: 780 [P.O.:780] Out: -      Consultants:  None  Procedures: Trach/Peg  Antimicrobials: None  Subjective: Working with PT today.  Eating well, per nursing.  Reaching out to CIR again today for pt's eligibility.     Objective: Vitals:   02/27/21 1951 02/28/21 0432 02/28/21 0500 02/28/21 0720  BP: (!) 134/42 (!) 152/58  (!) 140/50  Pulse: (!) 53 (!) 57  (!) 55  Resp:  20  (!) 24  Temp:  98.3 F (36.8 C)  98.3 F (36.8 C)  TempSrc:  Oral  Oral  SpO2:  100%  100%  Weight:   111.6 kg   Height:        Intake/Output Summary (Last 24 hours) at 02/28/2021 1639 Last data filed at 02/28/2021 1406 Gross per 24 hour  Intake 780 ml  Output 600 ml  Net 180 ml   Filed Weights   02/25/21 0500 02/27/21 0511 02/28/21 0500  Weight: 111 kg 110.7 kg 111.6 kg    Examination:  Constitutional: NAD, AAOx3 HEENT: conjunctivae and lids normal, EOMI CV: No cyanosis.   RESP: normal respiratory effort, on RA Extremities: No effusions, edema in BLE SKIN: warm,  dry Neuro: II - XII grossly intact.   Psych: flat mood and affect.     Data Reviewed: I have personally reviewed following labs and imaging studies  CBC: Recent Labs  Lab 02/24/21 0514  WBC 12.8*  HGB 10.1*  HCT 33.9*  MCV 82.1  PLT 129*   Basic Metabolic Panel: No results for input(s): NA, K, CL, CO2, GLUCOSE, BUN, CREATININE, CALCIUM, MG, PHOS in the last 168 hours.  GFR: Estimated Creatinine Clearance: 94.4 mL/min (by C-G formula based on SCr of 1.06 mg/dL). Liver Function Tests: No results for input(s): AST, ALT, ALKPHOS, BILITOT, PROT, ALBUMIN in the last 168 hours. No  results for input(s): LIPASE, AMYLASE in the last 168 hours. No results for input(s): AMMONIA in the last 168 hours. Coagulation Profile: No results for input(s): INR, PROTIME in the last 168 hours. Cardiac Enzymes: No results for input(s): CKTOTAL, CKMB, CKMBINDEX, TROPONINI in the last 168 hours. BNP (last 3 results) No results for input(s): PROBNP in the last 8760 hours. HbA1C: Recent Labs    02/26/21 1846  HGBA1C 7.1*   CBG: Recent Labs  Lab 02/27/21 1632 02/27/21 2211 02/28/21 0740 02/28/21 1130 02/28/21 1625  GLUCAP 151* 131* 125* 157* 185*   Lipid Profile: No results for input(s): CHOL, HDL, LDLCALC, TRIG, CHOLHDL, LDLDIRECT in the last 72 hours. Thyroid Function Tests: No results for input(s): TSH, T4TOTAL, FREET4, T3FREE, THYROIDAB in the last 72 hours. Anemia Panel: No results for input(s): VITAMINB12, FOLATE, FERRITIN, TIBC, IRON, RETICCTPCT in the last 72 hours. Sepsis Labs: No results for input(s): PROCALCITON, LATICACIDVEN in the last 168 hours.  No results found for this or any previous visit (from the past 240 hour(s)).        Radiology Studies: No results found.      Scheduled Meds:  aspirin  81 mg Oral Daily   carvedilol  12.5 mg Oral BID WC   diclofenac Sodium  2 g Topical QID   docusate sodium  100 mg Oral BID   enoxaparin (LOVENOX) injection  0.5 mg/kg Subcutaneous Q24H   famotidine  20 mg Oral Daily   feeding supplement (PROSource TF)  90 mL Per Tube QHS   free water  30 mL Per Tube Q4H   hydrALAZINE  50 mg Oral TID   insulin aspart  0-15 Units Subcutaneous TID WC   insulin aspart  0-5 Units Subcutaneous QHS   losartan  100 mg Oral Daily   mouth rinse  15 mL Mouth Rinse BID   neomycin-bacitracin-polymyxin   Topical BID   polyethylene glycol  17 g Oral Daily   predniSONE  40 mg Oral Q breakfast   Ensure Max Protein  11 oz Oral BID PC   Continuous Infusions:  feeding supplement (NEPRO CARB STEADY) 1,000 mL (02/27/21 2216)      LOS: 65 days    Darlin Priestly, MD [facility]   To contact the attending provider between 7A-7P or the covering provider during after hours 7P-7A, please log into the web site [email or web address] and access using universal [place] password for that web site. If you do not have the password, please call the hospital operator.  02/28/2021, 4:39 PM

## 2021-02-28 NOTE — Progress Notes (Signed)
Chaplain made follow up visitation at bedside. Patient expressed sadness at lack of family visitation and attention to his needs. He noted his nails need to be cut. Chaplain affirmed patient's feelings and frustration. As Chaplain met patient's brother during two previous occasions she will follow up with him by phone if possible. Chaplain will also follow up with patient to continue to explore spiritual and emotion needs.

## 2021-02-28 NOTE — Progress Notes (Addendum)
Physical Therapy Treatment Patient Details Name: Jeremy Browning MRN: [id number] DOB: 05/03/1961 Today's Date: 02/28/2021    History of Present Illness Pt is a 60 yo male that presented from home with respiratory distress 12/25/2020. Underwent defibrillation and CPR, emergently intubated. During admission pt has had a cardiac cath, peg tube and tracheostomy placed, weaned from vent 5/26 AM. 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 Mildly restricted diffusion involving the cortex more diffusely over the right cerebral convexity favored to reflect an acute MCA territory infarct over global hypoxic injury given unilaterality".    PT Comments    Pt alert in bed at PT arrival, required encouragement to engage in activity. Pt education provided on benefits of continuing daily mobility. Pt continues to progress with all activities requiring only supervision, bed rails for bed mobility. Progressed ambulation to 30 ft w/ max assist for L sided support and very close chair follow, pt utilizes R hand rail for L limb advancement. Pt demonstrated increase motivation following ambulation activity, requesting continued treatment. Primary limitations include L sided weakness and mobility, decreased cognition and short term memory. Skilled PT intervention is indicated to address deficits in function, mobility, and to return to PLOF as able.  Discharge recommendations remain CIR.    Follow Up Recommendations  CIR     Equipment Recommendations  Other (comment) (Determined by next venue of care)    Recommendations for Other Services       Precautions / Restrictions Precautions Precautions: Fall Precaution Comments: PEG Restrictions Weight Bearing Restrictions: No    Mobility  Bed Mobility Overal bed mobility: Needs Assistance Bed Mobility: Supine to Sit     Supine to sit: HOB elevated;Supervision Sit to supine: Supervision   General bed mobility  comments: Able to scoot, lift buttock with bed rails; HOB elevated for supine > sit    Transfers Overall transfer level: Needs assistance Equipment used: 1 person hand held assist Transfers: Sit to/from [employer] Transfers   Stand pivot transfers: Max assist      Lateral/Scoot Transfers: Min assist General transfer comment: Max A x 1 sit <> stand w/ R hand rail otherwise Max A x 2 w/o rail; able to laterally scoot bed <> chair to R w/ min assist  Ambulation/Gait Ambulation/Gait assistance: Max assist;+2 safety/equipment Gait Distance (Feet): 30 Feet Assistive device: 1 person hand held assist (R hand rail) Gait Pattern/deviations: Decreased step length - right;Decreased step length - left;Decreased dorsiflexion - left;Decreased stride length;Decreased weight shift to left;Decreased stance time - left Gait velocity: decreased   General Gait Details: WB support to L side for weight shift; cross-over gait pattern   Stairs             Wheelchair Mobility    Modified Rankin (Stroke Patients Only)       Balance Overall balance assessment: Needs assistance Sitting-balance support: Feet supported Sitting balance-Leahy Scale: Good Sitting balance - Comments: Supervision for safety   Standing balance support: Bilateral upper extremity supported;During functional activity Standing balance-Leahy Scale: Poor Standing balance comment: UE, trunk support on L w/ R UE support from hand rail                            Cognition Arousal/Alertness: Awake/alert Behavior During Therapy: WFL for tasks assessed/performed Overall Cognitive Status: Impaired/Different from baseline Area of Impairment: Orientation;Following commands;Safety/judgement;Attention;Rancho level;Memory  Rancho Levels of Cognitive Functioning Rancho Los Amigos Scales of Cognitive Functioning: Confused/appropriate Orientation Level: Disoriented to;Place;Time Current  Attention Level: Selective   Following Commands: Follows one step commands inconsistently       General Comments: Pt alert, not oriented to date/time, day of week; decreased STM for visitation stating "he needs family support."      Exercises Other Exercises Other Exercises: Pt education on mobility progression, current HEP, encouragement for mobility    General Comments        Pertinent Vitals/Pain Pain Assessment: Faces Faces Pain Scale: Hurts even more Pain Location: RLQ abdomen Pain Descriptors / Indicators: Sore Pain Intervention(s): Monitored during session;Limited activity within patient's tolerance;Repositioned    Home Living                      Prior Function            PT Goals (current goals can now be found in the care plan section) Acute Rehab PT Goals Patient Stated Goal: get better, work with therapy PT Goal Formulation: With patient Time For Goal Achievement: 03/13/21 Potential to Achieve Goals: Good Progress towards PT goals: Progressing toward goals    Frequency    Min 5X/week      PT Plan Current plan remains appropriate    Co-evaluation              AM-PAC PT "6 Clicks" Mobility   Outcome Measure  Help needed turning from your back to your side while in a flat bed without using bedrails?: A Little Help needed moving from lying on your back to sitting on the side of a flat bed without using bedrails?: A Little Help needed moving to and from a bed to a chair (including a wheelchair)?: A Lot Help needed standing up from a chair using your arms (e.g., wheelchair or bedside chair)?: A Lot Help needed to walk in hospital room?: Total Help needed climbing 3-5 steps with a railing? : Total 6 Click Score: 12    End of Session Equipment Utilized During Treatment: Gait belt Activity Tolerance: Patient tolerated treatment well Patient left: with bed alarm set;with call bell/phone within reach;in bed   PT Visit Diagnosis: Other  abnormalities of gait and mobility (R26.89);Muscle weakness (generalized) (M62.81);Other symptoms and signs involving the nervous system (R29.898);Hemiplegia and hemiparesis Hemiplegia - Right/Left: Left Hemiplegia - dominant/non-dominant: Non-dominant Hemiplegia - caused by: Cerebral infarction     Time: 1351-1500 PT Time Calculation (min) (ACUTE ONLY): 69 min  Charges:                        [employer], SPT

## 2021-03-01 ENCOUNTER — Other Ambulatory Visit: Payer: Self-pay

## 2021-03-01 ENCOUNTER — Encounter: Payer: Self-pay | Admitting: Pulmonary Disease

## 2021-03-01 DIAGNOSIS — R5381 Other malaise: Secondary | ICD-10-CM | POA: Diagnosis not present

## 2021-03-01 LAB — GLUCOSE, CAPILLARY
Glucose-Capillary: 125 mg/dL — ABNORMAL HIGH (ref 70–99)
Glucose-Capillary: 227 mg/dL — ABNORMAL HIGH (ref 70–99)
Glucose-Capillary: 163 mg/dL — ABNORMAL HIGH (ref 70–99)

## 2021-03-01 LAB — CREATININE, SERUM
Creatinine, Ser: 1.22 mg/dL (ref 0.61–1.24)
GFR, Estimated: >60 mL/min (ref >60)

## 2021-03-01 MED ORDER — PREDNISONE 20 MG PO TABS: 10.0000 mg | ORAL_TABLET | Freq: Every day | ORAL | Status: DC

## 2021-03-01 MED ORDER — ADULT MULTIVITAMIN W/MINERALS CH
1.0000 | ORAL_TABLET | Freq: Every day | ORAL | Status: DC
  Administered 2021-03-02 – 2021-03-08 (×7): 1
  Filled 2021-03-01 (×7): qty 1

## 2021-03-01 MED ORDER — NEPRO/CARBSTEADY PO LIQD
237.0000 mL | Freq: Three times a day (TID) | ORAL | Status: DC
  Administered 2021-03-01 – 2021-03-08 (×21): 237 mL via ORAL

## 2021-03-01 MED ORDER — PROSOURCE TF PO LIQD
45.0000 mL | Freq: Two times a day (BID) | ORAL | Status: DC
  Administered 2021-03-01 – 2021-03-02 (×2): 45 mL
  Filled 2021-03-01 (×4): qty 45

## 2021-03-01 MED ORDER — PREDNISONE 5 MG PO TABS: 5.0000 mg | ORAL_TABLET | Freq: Every day | ORAL | Status: DC

## 2021-03-01 MED ORDER — PREDNISONE 20 MG PO TABS
20.0000 mg | ORAL_TABLET | Freq: Every day | ORAL | Status: DC
  Administered 2021-03-07 – 2021-03-08 (×2): 20 mg via ORAL
  Filled 2021-03-01 (×2): qty 1

## 2021-03-01 MED ORDER — PREDNISONE 20 MG PO TABS
30.0000 mg | ORAL_TABLET | Freq: Every day | ORAL | Status: AC
  Administered 2021-03-02 – 2021-03-06 (×5): 30 mg via ORAL
  Filled 2021-03-01 (×5): qty 2

## 2021-03-01 NOTE — Progress Notes (Signed)
Inpatient Rehab Admissions Coordinator:   CIR consult received. Per TOC, Pt. Has no family support at home. Despite progress made with acute rehab and functional deficits,  pt. Does not have adequate support for a CIR admission. Family would need to be prepared to take Pt. Home from CIR in 3-4 weeks at likely mod assist level. Family previously stated that they cannot provide 24/7 support and requested SNF placement for Pt.  This does not appear to have changed. CIR will sign off.   Megan Salon, MS, CCC-SLP Rehab Admissions Coordinator  [phone] (celll) [phone] (office)

## 2021-03-01 NOTE — Evaluation (Signed)
Occupational Therapy RE- Evaluation Patient Details Name: Jeremy Browning MRN: [id number] DOB: [birth year] Today's Date: 03/01/2021    History of Present Illness Pt is a 60 yo male that presented from home with respiratory distress 12/25/2020. Underwent defibrillation and CPR, emergently intubated. During admission pt has had a cardiac cath, peg tube and tracheostomy placed, weaned from vent 5/26 AM. 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 Mildly restricted diffusion involving the cortex more diffusely over the right cerebral convexity favored to reflect an acute MCA territory infarct over global hypoxic injury given unilaterality".   Clinical Impression   Pt seen for OT re-evaluation with pt making progress towards goals. Pt needing maximal encouragement this session for bed mobility. Pt taking ~ 15 minutes to come to EOB and then requesting transfer into recliner chair. Pt performing bed mobility without assistance and needing min A for lateral scoot transfer into recliner chair. Environment modified to decrease distraction. Pt given SLUMS with score of 6/30 which demonstrates significant cognitive deficits. Pt was aware he was answering questions incorrectly and did appear to have increased frustration. Pt needing min - mod cuing to attend to tasks this session secondary to internal/external distractions. Pt remains in recliner chair with drawing items in front of him on tray table. Chair alarm activated and RN notified. Pt continues to make progress towards goals with goals upgraded.    Follow Up Recommendations  SNF;Supervision/Assistance - 24 hour    Equipment Recommendations  Other (comment) (defer to next venue of care)       Precautions / Restrictions Precautions Precautions: Fall Precaution Comments: PEG      Mobility Bed Mobility Overal bed mobility: Needs Assistance Bed Mobility: Supine to Sit     Supine to sit: HOB  elevated;Supervision     General bed mobility comments: max encouragment needed    Transfers Overall transfer level: Needs assistance   Transfers: Lateral/Scoot Transfers          Lateral/Scoot Transfers: Min assist General transfer comment: min A to scoot to the R into recliner chair with cuing for technique and safety    Balance Overall balance assessment: Needs assistance Sitting-balance support: Feet supported Sitting balance-Leahy Scale: Good Sitting balance - Comments: Supervision for safety                                       Vision Baseline Vision/History: No visual deficits Patient Visual Report: No change from baseline              Pertinent Vitals/Pain Pain Assessment: Faces Faces Pain Scale: No hurt              Cognition Arousal/Alertness: Awake/alert Behavior During Therapy: WFL for tasks assessed/performed Overall Cognitive Status: Impaired/Different from baseline Area of Impairment: Orientation;Following commands;Safety/judgement;Attention;Rancho level;Memory               Rancho Levels of Cognitive Functioning Rancho Los Amigos Scales of Cognitive Functioning: Confused/appropriate Orientation Level: Disoriented to;Place;Time Current Attention Level: Selective Memory: Decreased short-term memory Following Commands: Follows one step commands inconsistently Safety/Judgement: Decreased awareness of deficits;Decreased awareness of safety     General Comments: Pt alert but not oriented to date/time.           OT Goals(Current goals can be found in the care plan section) Acute Rehab OT Goals Patient Stated Goal: get better, work with therapy OT Goal  Formulation: With patient Time For Goal Achievement: 03/15/21 Potential to Achieve Goals: Fair ADL Goals Pt Will Transfer to Toilet: with mod assist;bedside commode  OT Frequency: Min 2X/week    AM-PAC OT "6 Clicks" Daily Activity     Outcome Measure Help from  another person eating meals?: A Little Help from another person taking care of personal grooming?: A Little Help from another person toileting, which includes using toliet, bedpan, or urinal?: A Lot Help from another person bathing (including washing, rinsing, drying)?: A Lot Help from another person to put on and taking off regular upper body clothing?: A Little Help from another person to put on and taking off regular lower body clothing?: A Lot 6 Click Score: 15   End of Session Nurse Communication: Mobility status;Precautions  Activity Tolerance: Patient tolerated treatment well Patient left: in chair;with call bell/phone within reach;with chair alarm set  OT Visit Diagnosis: Other symptoms and signs involving the nervous system (R29.898);Other symptoms and signs involving cognitive function                Time: 1003-1049 OT Time Calculation (min): 46 min Charges:  OT General Charges $OT Visit: 1 Visit OT Evaluation $OT Re-eval: 1 Re-eval OT Treatments $Therapeutic Activity: 23-37 mins $Neuromuscular Re-education: 8-22 mins Jackquline Denmark, MS, OTR/L , CBIS ascom [phone]  03/01/21, 12:49 PM

## 2021-03-01 NOTE — Progress Notes (Signed)
PROGRESS NOTE    Jeremy Browning  AVW:[id number] DOB: [birth year] DOA: 12/25/2020 PCP: No primary care provider on file.    Brief Narrative:  Jeremy Browning is an 60 y.o. male with obesity, DM2, HTN, combined CHF who was admitted on 12/25/2020 with respiratory distress.  He was intubated in the ER also had VT requiring epi/cardioversion.  Hospital course complicated by persistent vent dependent respiratory failure eventually requiring tracheostomy, anoxic encephalopathy. Patient had coronary angiogram performed 5/7, reviewed and normal coronary arteries.  Echocardiogram showed ejection fraction 45 to 50% with grade 1 diastolic dysfunction.   Patient since has improved.  Jeremy Browning is removed.  Patient is able to start eating, also receiving tube feeding.  Currently no discharge option.   Assessment & Plan:   Active Problems:   Cardiogenic shock (HCC)   Cardiac arrest with successful resuscitation (HCC)   Flash pulmonary edema (HCC)   Cardiopulmonary arrest with successful resuscitation (HCC)   Acute respiratory failure with hypoxia and hypercapnia (HCC)   Acute renal failure (HCC)   Transaminitis   Second degree heart block   Lactic acidosis   Aspiration pneumonia (HCC)   HFrEF (heart failure with reduced ejection fraction) (HCC)   Severe hypoxic-ischemic encephalopathy   SOB (shortness of breath)   Oropharyngeal dysphagia   Pressure injury of skin   Right knee DJD   Essential hypertension   Acute on chronic diastolic CHF (congestive heart failure) (HCC)   Proteus pneumonia (HCC)  Patient is currently stable.  Appetite improving.  RD managing nocturnal tube feeding based on daytime calorie intake.  Pt was started on steroids on 6/17 for right knee swelling.  Pharm ordered taper today.  He is continued on PT/OT.  He need nursing home placement, but currently no acceptance.  He may have to continue PT/OT in the hospital until he is ready to discharge home.  C[facility] can not accept because pt  doesn't have family support at home after discharge.   DVT prophylaxis: Lovenox Code Status: DNR Family Communication:  Disposition Plan:    Status is: Inpatient  Remains inpatient appropriate because:Unsafe d/c plan  Dispo: The patient is from: Home              Anticipated d/c is to: Uncertain              Patient currently is medically stable to d/c.   Difficult to place patient Yes   I/O last 3 completed shifts: In: 1140 [P.O.:1140] Out: 1400 [Urine:1400] Total I/O In: 480 [P.O.:480] Out: 300 [Urine:300]     Consultants:  None  Procedures: Trach/Peg  Antimicrobials: None  Subjective: Calorie count continues, pt is eating well.  Pt continues to work with PT/OT and is highly motivated.   Objective: Vitals:   03/01/21 0408 03/01/21 0858 03/01/21 1438 03/01/21 1440  BP: (!) 151/46 (!) 134/47 (!) 90/39 (!) 109/41  Pulse: 71 64    Resp: 16 18    Temp: 98.9 F (37.2 C) 98.4 F (36.9 C)    TempSrc: Oral Oral    SpO2: 97% 97%    Weight:      Height:        Intake/Output Summary (Last 24 hours) at 03/01/2021 1648 Last data filed at 03/01/2021 1300 Gross per 24 hour  Intake 840 ml  Output 1100 ml  Net -260 ml   Filed Weights   02/25/21 0500 02/27/21 0511 02/28/21 0500  Weight: 111 kg 110.7 kg 111.6 kg    Examination:  Constitutional: NAD CV:  No cyanosis.   RESP: normal respiratory effort, on RA GI: abdomen soft, PEG tube present SKIN: warm, dry   Data Reviewed: I have personally reviewed following labs and imaging studies  CBC: Recent Labs  Lab 02/24/21 0514  WBC 12.8*  HGB 10.1*  HCT 33.9*  MCV 82.1  PLT 129*   Basic Metabolic Panel: Recent Labs  Lab 03/01/21 0629  CREATININE 1.22    GFR: Estimated Creatinine Clearance: 82 mL/min (by C-G formula based on SCr of 1.22 mg/dL). Liver Function Tests: No results for input(s): AST, ALT, ALKPHOS, BILITOT, PROT, ALBUMIN in the last 168 hours. No results for input(s): LIPASE, AMYLASE in  the last 168 hours. No results for input(s): AMMONIA in the last 168 hours. Coagulation Profile: No results for input(s): INR, PROTIME in the last 168 hours. Cardiac Enzymes: No results for input(s): CKTOTAL, CKMB, CKMBINDEX, TROPONINI in the last 168 hours. BNP (last 3 results) No results for input(s): PROBNP in the last 8760 hours. HbA1C: Recent Labs    02/26/21 1846  HGBA1C 7.1*   CBG: Recent Labs  Lab 02/28/21 0740 02/28/21 1130 02/28/21 1625 02/28/21 2116 03/01/21 0854  GLUCAP 125* 157* 185* 145* 125*   Lipid Profile: No results for input(s): CHOL, HDL, LDLCALC, TRIG, CHOLHDL, LDLDIRECT in the last 72 hours. Thyroid Function Tests: No results for input(s): TSH, T4TOTAL, FREET4, T3FREE, THYROIDAB in the last 72 hours. Anemia Panel: No results for input(s): VITAMINB12, FOLATE, FERRITIN, TIBC, IRON, RETICCTPCT in the last 72 hours. Sepsis Labs: No results for input(s): PROCALCITON, LATICACIDVEN in the last 168 hours.  No results found for this or any previous visit (from the past 240 hour(s)).        Radiology Studies: No results found.      Scheduled Meds:  aspirin  81 mg Oral Daily   carvedilol  12.5 mg Oral BID WC   diclofenac Sodium  2 g Topical QID   docusate sodium  100 mg Oral BID   enoxaparin (LOVENOX) injection  0.5 mg/kg Subcutaneous Q24H   famotidine  20 mg Oral Daily   feeding supplement (NEPRO CARB STEADY)  237 mL Oral TID BM   feeding supplement (PROSource TF)  45 mL Per Tube BID   free water  30 mL Per Tube Q4H   insulin aspart  0-15 Units Subcutaneous TID WC   insulin aspart  0-5 Units Subcutaneous QHS   losartan  100 mg Oral Daily   mouth rinse  15 mL Mouth Rinse BID   [START ON 03/02/2021] multivitamin with minerals  1 tablet Per Tube Daily   neomycin-bacitracin-polymyxin   Topical BID   polyethylene glycol  17 g Oral Daily   [START ON 03/02/2021] predniSONE  30 mg Oral Q breakfast   Followed by   Melene Muller ON 03/07/2021] predniSONE  20  mg Oral Q breakfast   Followed by   Melene Muller ON 03/12/2021] predniSONE  10 mg Oral Q breakfast   Followed by   Melene Muller ON 03/17/2021] predniSONE  5 mg Oral Q breakfast   Continuous Infusions:     LOS: 66 days    Darlin Priestly, MD [facility]   To contact the attending provider between 7A-7P or the covering provider during after hours 7P-7A, please log into the web site [email or web address] and access using universal [place] password for that web site. If you do not have the password, please call the hospital operator.  03/01/2021, 4:48 PM

## 2021-03-01 NOTE — Progress Notes (Signed)
Physical Therapy Treatment Patient Details Name: Jeremy Browning MRN: [id number] DOB: [birth year] Today's Date: 03/01/2021    History of Present Illness Pt is a 60 yo male that presented from home with respiratory distress 12/25/2020. Underwent defibrillation and CPR, emergently intubated. During admission pt has had a cardiac cath, peg tube and tracheostomy placed, weaned from vent 5/26 AM. 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 Mildly restricted diffusion involving the cortex more diffusely over the right cerebral convexity favored to reflect an acute MCA territory infarct over global hypoxic injury given unilaterality".    PT Comments    Patient alert, in bed with CNAs in prep for linen/gown change. PT instead assisted with gown change/brief change, pt able to roll L and R with CGA and use of bed rails. The patient continues to demonstrate improvement in activity tolerance, endurance, and is highly motivated to work with therapy. minA to lateral scoot R in recliner, (modA to return to bed to L, 2+ for safety). Several sit to stands with RUE support on railing, maxA. L knee block/foot block to prevent leg from slipping on floor. The patient ambulated 64ft total, seated rest breaks at 69ft intervals. Decreased assist needed once up in standing, modA to assist with weight shift and trunk control. Returned pt to bed at end of session. The patient would benefit from further skilled PT intervention to continue to progress towards goals. Recommendation remains appropriate.     Follow Up Recommendations  CIR     Equipment Recommendations  Other (comment) (Determined by next venue of care)    Recommendations for Other Services       Precautions / Restrictions Precautions Precautions: Fall Precaution Comments: PEG Restrictions Weight Bearing Restrictions: No    Mobility  Bed Mobility Overal bed mobility: Needs Assistance Bed Mobility: Supine  to Sit Rolling: Supervision   Supine to sit: HOB elevated;Supervision Sit to supine: Supervision   General bed mobility comments: max encouragment needed    Transfers Overall transfer level: Needs assistance Equipment used: 1 person hand held assist Transfers: Lateral/Scoot Transfers;Sit to/from Stand Sit to Stand: Max assist;From elevated surface (L knee block due to feet sliding)        Lateral/Scoot Transfers: Min assist;Mod assist General transfer comment: minA for LLE assist to scoot to R to chair; modA with 2+ for safety to transfer to L back to bed  Ambulation/Gait Ambulation/Gait assistance: Max assist;+2 safety/equipment Gait Distance (Feet): 45 Feet Assistive device: 1 person hand held assist Gait Pattern/deviations: Decreased step length - right;Decreased step length - left;Decreased dorsiflexion - left;Decreased stride length;Decreased weight shift to left;Decreased stance time - left Gait velocity: decreased   General Gait Details: with fatigue, decreased base of support and step length noted   Stairs             Wheelchair Mobility    Modified Rankin (Stroke Patients Only)       Balance Overall balance assessment: Needs assistance Sitting-balance support: Feet supported Sitting balance-Leahy Scale: Good Sitting balance - Comments: Supervision for safety   Standing balance support: Bilateral upper extremity supported;During functional activity Standing balance-Leahy Scale: Poor Standing balance comment: UE, trunk support on L w/ R UE support from hand rail                            Cognition Arousal/Alertness: Awake/alert Behavior During Therapy: WFL for tasks assessed/performed Overall Cognitive Status: Impaired/Different from baseline Area  of Impairment: Orientation;Following commands;Safety/judgement;Attention;Rancho level;Memory               Rancho Levels of Cognitive Functioning Rancho Los Amigos Scales of Cognitive  Functioning: Confused/appropriate Orientation Level: Disoriented to;Place;Time Current Attention Level: Selective Memory: Decreased short-term memory Following Commands: Follows one step commands consistently Safety/Judgement: Decreased awareness of deficits;Decreased awareness of safety     General Comments: Pt alert but not oriented to date/time.      Exercises Other Exercises Other Exercises: rolling performed with CGA for bowel movement, donning the brief    General Comments        Pertinent Vitals/Pain Pain Assessment: Faces Faces Pain Scale: No hurt    Home Living                      Prior Function            PT Goals (current goals can now be found in the care plan section) Acute Rehab PT Goals Patient Stated Goal: get better, work with therapy Progress towards PT goals: Progressing toward goals    Frequency    Min 5X/week      PT Plan Current plan remains appropriate    Co-evaluation              AM-PAC PT "6 Clicks" Mobility   Outcome Measure  Help needed turning from your back to your side while in a flat bed without using bedrails?: A Little Help needed moving from lying on your back to sitting on the side of a flat bed without using bedrails?: A Little Help needed moving to and from a bed to a chair (including a wheelchair)?: A Lot Help needed standing up from a chair using your arms (e.g., wheelchair or bedside chair)?: A Lot Help needed to walk in hospital room?: Total Help needed climbing 3-5 steps with a railing? : Total 6 Click Score: 12    End of Session Equipment Utilized During Treatment: Gait belt Activity Tolerance: Patient tolerated treatment well Patient left: with bed alarm set;with call bell/phone within reach;in bed   PT Visit Diagnosis: Other abnormalities of gait and mobility (R26.89);Muscle weakness (generalized) (M62.81);Other symptoms and signs involving the nervous system (R29.898);Hemiplegia and  hemiparesis Hemiplegia - Right/Left: Left Hemiplegia - dominant/non-dominant: Non-dominant Hemiplegia - caused by: Cerebral infarction     Time: 1121-1209 PT Time Calculation (min) (ACUTE ONLY): 48 min  Charges:  $Therapeutic Exercise: 23-37 mins $Therapeutic Activity: 8-22 mins                    Olga Coaster PT, DPT 12:21 PM,03/01/21

## 2021-03-01 NOTE — TOC Progression Note (Signed)
Transition of Care [facility]) - Progression Note    Patient Details  Name: Jeremy Browning MRN: [id number] Date of Birth: 07-18-1961  [facility]) CM/SW Contact  Chapman Fitch, RN Phone Number: 03/01/2021, 10:06 AM  Clinical Narrative:      Email sent to Vincenza Hews with [employer] to see if there are any of the [facility] with availability for LTC medicaid Healthy blue Expected Discharge Plan: Skilled Nursing Facility Barriers to Discharge: Continued Medical Work up  Expected Discharge Plan and Services Expected Discharge Plan: Skilled Nursing Facility In-house Referral: Clinical Social Work   Post Acute Care Choice: Skilled Nursing Facility                                         Social Determinants of Health (SDOH) Interventions    Readmission Risk Interventions No flowsheet data found.

## 2021-03-01 NOTE — Consult Note (Signed)
Physical Medicine and Rehabilitation Consult Follow-Up Reason for Consult: Anoxic TBI Referring Physician: Arnetha Courser, MD   HPI: Jeremy Browning is a 60 y.o. male with obesity, DM, HTN, and CHF who presented to the ED in respiratory distress and required intubation. CCB Vtach and anoxic brain injury. Physical Medicine & Rehabilitation was consulted to assess candidacy for CIR. Patient is currently with [facility] improved cognition, attention, and ability to follow commands. Received notification from PT and OT that patient is demonstrating significant improvements, and for request for re-evaluation.    ROS: Unable to obtain due to cognitive deficits Past Medical History:  Diagnosis Date   Diabetes mellitus type II, non insulin dependent (HCC)    Essential hypertension [name]   Heart failure (HCC)    Unknown details.  By report no cardiac surgery   Venous stasis dermatitis of left lower extremity    Past Surgical History:  Procedure Laterality Date   CARDIAC CATHETERIZATION     LEFT HEART CATH AND CORONARY ANGIOGRAPHY N/A 12/25/2020   Procedure: LEFT HEART CATH AND CORONARY ANGIOGRAPHY;  Surgeon: Marykay Lex, MD;  Location: [facility] INVASIVE CV LAB;  Service: Cardiovascular;  Laterality: N/A;   PEG PLACEMENT N/A 01/11/2021   Procedure: PERCUTANEOUS ENDOSCOPIC GASTROSTOMY (PEG) PLACEMENT;  Surgeon: Midge Minium, MD;  Location: [facility] ENDOSCOPY;  Service: Endoscopy;  Laterality: N/A;   RIGHT HEART CATH N/A 12/25/2020   Procedure: RIGHT HEART CATH;  Surgeon: Marykay Lex, MD;  Location: [facility] INVASIVE CV LAB;  Service: Cardiovascular;  Laterality: N/A;   TRACHEOSTOMY TUBE PLACEMENT N/A 01/12/2021   Procedure: TRACHEOSTOMY;  Surgeon: Bud Face, MD;  Location: [facility] ORS;  Service: ENT;  Laterality: N/A;   Unknown     Family History  Family history unknown: Yes   Social History:  has no history on file for tobacco use, alcohol use, and drug use. Allergies: Not on  File Medications Prior to Admission  Medication Sig Dispense Refill   aspirin EC 81 MG tablet Take 81 mg by mouth daily. Swallow whole.     atorvastatin (LIPITOR) 80 MG tablet Take 80 mg by mouth daily.     carvedilol (COREG) 12.5 MG tablet Take 12.5 mg by mouth 2 (two) times daily with a meal.     furosemide (LASIX) 80 MG tablet Take 80 mg by mouth daily.     losartan (COZAAR) 50 MG tablet Take 50 mg by mouth daily.      Home: Home Living Available Help at Discharge: Skilled Nursing Facility Type of Home: House Additional Comments: pt unable to answer questions at this time, no family available. Will attempt to f/u with family as able.  Lives With:  (unknown)  Functional History:   Functional Status:  Mobility: Bed Mobility Overal bed mobility: Needs Assistance Bed Mobility: Supine to Sit Rolling: Supervision Supine to sit: HOB elevated, Supervision Sit to supine: Supervision General bed mobility comments: max encouragment needed Transfers Overall transfer level: Needs assistance Equipment used: 1 person hand held assist Transfer via Lift Equipment:  Health visitor) Transfers: Lateral/Scoot Transfers, Sit to/from Stand Sit to Stand: Max assist, From elevated surface (L knee block due to feet sliding) Stand pivot transfers: Max assist Squat pivot transfers: Max assist  Lateral/Scoot Transfers: Min assist, Mod assist General transfer comment: minA for LLE assist to scoot to R to chair; modA with 2+ for safety to transfer to L back to bed Ambulation/Gait Ambulation/Gait assistance: Max assist, +2 safety/equipment Gait Distance (Feet): 45 Feet Assistive device:  1 person hand held assist Gait Pattern/deviations: Decreased step length - right, Decreased step length - left, Decreased dorsiflexion - left, Decreased stride length, Decreased weight shift to left, Decreased stance time - left General Gait Details: with fatigue, decreased base of support and step length noted Gait velocity:  decreased    ADL: ADL Overall ADL's : Needs assistance/impaired Grooming: Wash/dry face, Set up, Sitting Grooming Details (indicate cue type and reason): min cuing to initiate task Upper Body Bathing: Minimal assistance, Sitting Lower Body Bathing: Moderate assistance Lower Body Bathing Details (indicate cue type and reason): Pt washing B LEs but needing assistance for feet and returning to bed for assistance to wash buttocks Upper Body Dressing : Minimal assistance Upper Body Dressing Details (indicate cue type and reason): to don hospital gown General ADL Comments: MAX A x3 for perihygiene and don/doff brief standing EOC. MAX A for ADL t/f  Cognition: Cognition Overall Cognitive Status: Impaired/Different from baseline Arousal/Alertness: Awake/alert Orientation Level: Oriented X4 (forgetful) Attention: Selective Selective Attention: Impaired Selective Attention Impairment: Verbal complex, Functional complex Memory: Impaired Memory Impairment: Retrieval deficit, Decreased recall of new information, Decreased short term memory Decreased Short Term Memory: Verbal complex, Functional complex Awareness: Impaired Awareness Impairment: Anticipatory impairment Problem Solving: Impaired Problem Solving Impairment: Functional basic, Verbal basic Safety/Judgment: Appears intact Rancho Mirant Scales of Cognitive Functioning: Confused/appropriate Cognition Arousal/Alertness: Awake/alert Behavior During Therapy: WFL for tasks assessed/performed Overall Cognitive Status: Impaired/Different from baseline Area of Impairment: Orientation, Following commands, Safety/judgement, Attention, Rancho level, Memory Orientation Level: Disoriented to, Place, Time Current Attention Level: Selective Memory: Decreased short-term memory Following Commands: Follows one step commands consistently Safety/Judgement: Decreased awareness of deficits, Decreased awareness of safety General Comments: Pt alert  but not oriented to date/time.  Blood pressure (!) 109/41, pulse 64, temperature 98.4 F (36.9 C), temperature source Oral, resp. rate 18, height 6' (1.829 m), weight 111.6 kg, SpO2 97 %.  Gen: no distress, normal appearing, obese BMI 33.37 HEENT: oral mucosa pink and moist, NCAT Cardio: Reg rate Chest: normal effort, normal rate of breathing Abd: soft, non-distended Ext: no edema Psych: pleasant, normal affect Skin: intact Neuro: Alert and oriented x2. Speech much improved. Poor awareness of deficits. Slow responses Musculoskeletal: Lifts both hands and legs but unable to lift long enough/high enough to tolerate MMT  Results for orders placed or performed during the hospital encounter of 12/25/20 (from the past 24 hour(s))  Glucose, capillary     Status: Abnormal   Collection Time: 02/28/21  9:16 PM  Result Value Ref Range   Glucose-Capillary 145 (H) 70 - 99 mg/dL  Creatinine, serum     Status: None   Collection Time: 03/01/21  6:29 AM  Result Value Ref Range   Creatinine, Ser 1.22 0.61 - 1.24 mg/dL   GFR, Estimated >78 >29 mL/min  Glucose, capillary     Status: Abnormal   Collection Time: 03/01/21  8:54 AM  Result Value Ref Range   Glucose-Capillary 125 (H) 70 - 99 mg/dL   No results found.  Assessment/Plan: 1) Anoxic brain injury: -Patient has made excellent progress! He would be an excellent CIR candidate if he had 24/7 supervision, but given his limited family support, long term care is likely the most appropriate option for him. hopes that he will eventually be able to tolerate CIR. Thank you for this consult and your excellent care.   2) Hypotension Please consider decreasing anti-hypertensives given low BP, to allow adequate brain perfusion.  3) Vitamin D deficiency: -recommend ergocalciferol  50,000U once per week for 7 weeks    Horton Chin, MD 03/01/2021

## 2021-03-01 NOTE — Progress Notes (Signed)
48 hour Calorie Count Day 1  Estimated Nutritional Needs:   Kcal:  2500-2800 kcal/d Protein:  125-140g/day Fluid:  2.4-2.7L/day   Dinner 7/11: 75% pureed chicken w/ gravy, 75% mashed potatoes, 75% pureed green beans, 100% pudding, 100% unsweet tea, 100% cola  Total- 550kcal and 19g protein   Breakfast 7/12: 50% OJ, pureed pinapple, grits, eggs, pureed waffle and milk   Total- 421kcal and 12g protein   Lunch 7/12: 100% of pureed beef, mashed potatoes, corn and mixed berries  Total- 558kcal and 26g protein   Total Intake Day 1:  1529kcal (61% estimated needs) and  57g protein (45% estimated needs)  Betsey Holiday MS, RD, LDN Please refer to [facility] for RD and/or RD on-call/weekend/after hours pager

## 2021-03-01 NOTE — Plan of Care (Signed)
  Problem: Clinical Measurements: Goal: Ability to maintain clinical measurements within normal limits will improve Outcome: Progressing Goal: Will remain free from infection Outcome: Progressing Goal: Diagnostic test results will improve Outcome: Progressing Goal: Respiratory complications will improve Outcome: Progressing Goal: Cardiovascular complication will be avoided Outcome: Progressing   Problem: Nutrition: Goal: Adequate nutrition will be maintained Outcome: Progressing   Problem: Elimination: Goal: Will not experience complications related to bowel motility Outcome: Progressing Goal: Will not experience complications related to urinary retention Outcome: Progressing   Problem: Pain Managment: Goal: General experience of comfort will improve Outcome: Progressing   Problem: Safety: Goal: Ability to remain free from injury will improve Outcome: Progressing   Problem: Skin Integrity: Goal: Risk for impaired skin integrity will decrease Outcome: Progressing   Problem: Activity: Goal: Ability to tolerate increased activity will improve Outcome: Progressing   Problem: Respiratory: Goal: Ability to maintain a clear airway and adequate ventilation will improve Outcome: Progressing   Problem: Role Relationship: Goal: Method of communication will improve Outcome: Progressing   

## 2021-03-01 NOTE — TOC Progression Note (Signed)
Transition of Care [facility]) - Progression Note    Patient Details  Name: Jeremy Browning MRN: [id number] Date of Birth: 1960-11-29  [facility]) CM/SW Contact  Chapman Fitch, RN Phone Number: 03/01/2021, 10:36 AM  Clinical Narrative:    Spoke with sister in law [place].  Updated her on patient's progression Per Joni Reining prior to admission patient has lived in her home for 13 years on the 2nd floor  Per Joni Reining it is still not an option for patient to return home at discharge.  She states that both her and her husband work, and the patient would be home alone, with her 80 year elderly mother in the home and she is not able to provide the patient with any assistance.  "He would essentially need to be independent in order to come back"  She confirms the wish to purse long term care placement for the patient.  She also confirms that it is still not an option for the patient to discharge to his sister Angela's home   Expected Discharge Plan: Skilled Nursing Facility Barriers to Discharge: Continued Medical Work up  Expected Discharge Plan and Services Expected Discharge Plan: Skilled Nursing Facility In-house Referral: Clinical Social Work   Post Acute Care Choice: Skilled Nursing Facility                                         Social Determinants of Health (SDOH) Interventions    Readmission Risk Interventions No flowsheet data found.

## 2021-03-02 DIAGNOSIS — I469 Cardiac arrest, cause unspecified: Secondary | ICD-10-CM | POA: Diagnosis not present

## 2021-03-02 LAB — GLUCOSE, CAPILLARY
Glucose-Capillary: 101 mg/dL — ABNORMAL HIGH (ref 70–99)
Glucose-Capillary: 146 mg/dL — ABNORMAL HIGH (ref 70–99)
Glucose-Capillary: 183 mg/dL — ABNORMAL HIGH (ref 70–99)
Glucose-Capillary: 172 mg/dL — ABNORMAL HIGH (ref 70–99)

## 2021-03-02 MED ORDER — ASCORBIC ACID 500 MG PO TABS
250.0000 mg | ORAL_TABLET | Freq: Two times a day (BID) | ORAL | Status: DC
  Administered 2021-03-02 – 2021-03-08 (×12): 250 mg via ORAL
  Filled 2021-03-02 (×12): qty 1

## 2021-03-02 MED ORDER — HYDRALAZINE HCL 25 MG PO TABS
25.0000 mg | ORAL_TABLET | Freq: Three times a day (TID) | ORAL | Status: DC
  Administered 2021-03-02 – 2021-03-05 (×8): 25 mg via ORAL
  Filled 2021-03-02 (×8): qty 1

## 2021-03-02 NOTE — Progress Notes (Signed)
Inpatient Rehab Admissions Coordinator:   I will not pursue CIR admission at this time. Dr Carlis Abbott, rehab MD, re-consulted and saw Pt. Yesterday. Despite progress made, it appears that pt. Will still need at least 24/7 supervision at discharge and family is not able to provide that, as family works during the day and needs Pt. To independent enough to be left alone for long periods. If family's ability to provide 24/7 support changes, we can re-consider CIR candidacy at that time. Please consult me with any questions.   Megan Salon, MS, CCC-SLP Rehab Admissions Coordinator  [phone] (celll) [phone] (office)

## 2021-03-02 NOTE — Plan of Care (Signed)
  Problem: Clinical Measurements: Goal: Ability to maintain clinical measurements within normal limits will improve Outcome: Progressing Goal: Will remain free from infection Outcome: Progressing Goal: Diagnostic test results will improve Outcome: Progressing Goal: Respiratory complications will improve Outcome: Progressing Goal: Cardiovascular complication will be avoided Outcome: Progressing   Problem: Nutrition: Goal: Adequate nutrition will be maintained Outcome: Progressing   Problem: Elimination: Goal: Will not experience complications related to bowel motility Outcome: Progressing Goal: Will not experience complications related to urinary retention Outcome: Progressing   Problem: Pain Managment: Goal: General experience of comfort will improve Outcome: Progressing   Problem: Safety: Goal: Ability to remain free from injury will improve Outcome: Progressing   Problem: Skin Integrity: Goal: Risk for impaired skin integrity will decrease Outcome: Progressing   Problem: Activity: Goal: Ability to tolerate increased activity will improve Outcome: Progressing   Problem: Respiratory: Goal: Ability to maintain a clear airway and adequate ventilation will improve Outcome: Progressing   Problem: Role Relationship: Goal: Method of communication will improve Outcome: Progressing

## 2021-03-02 NOTE — Plan of Care (Signed)
  Problem: Clinical Measurements: Goal: Ability to maintain clinical measurements within normal limits will improve Outcome: Progressing Goal: Will remain free from infection Outcome: Progressing Goal: Diagnostic test results will improve Outcome: Progressing Goal: Respiratory complications will improve Outcome: Progressing Goal: Cardiovascular complication will be avoided Outcome: Progressing   Problem: Nutrition: Goal: Adequate nutrition will be maintained Outcome: Progressing   Problem: Pain Managment: Goal: General experience of comfort will improve Outcome: Progressing   Pt is involved in and agrees with the plan of care. V/S stable. Denies any pain or SOB. Tolerates his nephro supplement. Peg tube in place.

## 2021-03-02 NOTE — Progress Notes (Signed)
[facility] PROGRESS NOTE  Patient: Jeremy Browning HWE:[id number]   PCP: No primary care provider on file. DOB: Jul 26, 1961   DOA: 12/25/2020   DOS: 03/02/2021    Subjective: No acute complaint.  No nausea no vomiting.  No fever no chills.  Sleeping okay.  No constipation reported by the patient.    Objective:  Vitals:   03/02/21 0454 03/02/21 0757  BP: (!) 163/52 (!) 154/58  Pulse: (!) 53 (!) 53  Resp: 20 16  Temp: 98.2 F (36.8 C) 97.9 F (36.6 C)  SpO2: 100% 100%    S1-S2 present. Bilateral breath sound present, clear to auscultation. Bowel sound present.  No abdominal tenderness. No edema.  Assessment and plan: Hypertension. Will adjust blood pressure medication.  Resolved issues. Acute hypoxic respiratory failure in the setting of cardiac arrest and Proteus pneumonia Requiring tracheostomy SP decannulation Cardiogenic shock. Acute combined systolic and diastolic CHF Non-STEMI V. tach Acute metabolic encephalopathy Acute kidney injury Anemia chronic kidney disease Obesity  Full note to follow.   Author: Lynden Oxford, MD [facility] 03/02/2021 11:09 AM   If 7PM-7AM, please contact night-coverage at [email or web address]

## 2021-03-02 NOTE — Progress Notes (Addendum)
Nutrition Follow Up Note   DOCUMENTATION CODES:   Obesity unspecified  INTERVENTION:   Hold nocturnal tube feeds   Discontinue ProSource TF  Nepro Shake po TID, each supplement provides 425 kcal and 19 grams protein  Magic cup TID with meals, each supplement provides 290 kcal and 9 grams of protein  MVI po daily   Vitamin C 274m po BID   48 hour calorie count  NUTRITION DIAGNOSIS:   Inadequate oral intake related to inability to eat (pt sedated and ventilated) as evidenced by NPO status.  GOAL:   Patient will meet greater than or equal to 90% of their needs  -met   MONITOR:   Labs, Weight trends, TF tolerance, Skin, I & O's  ASSESSMENT:   60y.o. black male with diabetes mellitus type II, hypertension and congestive heart failure [facility] and flash pulmonary edema  Pt s/p PEG tube 5/24   Met with pt in room today. Pt reports good appetite and oral intake in hospital; pt reports that he is eating 100% of his meals. Pt reports that he loves all the flavors of the Nepro and reports that he is drinking 3 per day. Pt also reports that he is eating his Magic Cups. 48 hour calorie count completed; pt meeting 45-85% of his needs via oral intake of meals. Pt is able to meet 100% of his estimated needs with meals and supplements. Pt seen by SLP today and upgraded to a dysphagia 2 diet. Will hold nocturnal tube feeds at this time as pt is meeting his needs via oral intake. Will monitor pt's weight. If pt is able to maintain his weight via oral intake; PEG tube can be removed. Would recommend monitoring for at least 1 month prior to removing PEG tube. Pt is pending placement to SNF. Per chart, pt has been weight stable for the past month.    Medications reviewed and include: aspirin, colace, lovenox, pepcid, insulin, MVI, miralax, prednisone   Labs reviewed: creat 1.22- 7/12 K 4.2 wnl, P 3.3 wnl, Mg 2.1 wnl- 6/29 Wbc- 12.8(H), Hgb  10.1(L), Hct 33.9(L), MCH 24.5(L), MCHC 29.8(L) cbgs- 101, 146 x 24 hrs  Diet Order:   Diet Order             DIET DYS 2 Room service appropriate? Yes; Fluid consistency: Thin  Diet effective now                  EDUCATION NEEDS:   No education needs have been identified at this time  Skin:  Skin Assessment: Skin Integrity Issues: Skin Integrity Issues:: Stage II Stage II: butocks: incision neck s/p trach   Last BM:  7/13- type 6  Height:   Ht Readings from Last 1 Encounters:  02/05/21 6' (1.829 m)    Weight:   Wt Readings from Last 1 Encounters:  02/28/21 111.6 kg    BMI:  Body mass index is 33.37 kg/m.  Estimated Nutritional Needs:   Kcal:  2500-2800 kcal/d  Protein:  125-140g/day  Fluid:  2.4-2.7L/day  CKoleen DistanceMS, RD, LDN [facility] and/or RD on-call/weekend/after hours pager

## 2021-03-02 NOTE — Progress Notes (Addendum)
Physical Therapy Treatment Patient Details Name: Jeremy Browning MRN: [id number] DOB: 04-08-1961 Today's Date: 03/02/2021    History of Present Illness Pt is a 60 yo male that presented from home with respiratory distress 12/25/2020. Underwent defibrillation and CPR, emergently intubated. During admission pt has had a cardiac cath, peg tube and tracheostomy placed, weaned from vent 5/26 AM. 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 Mildly restricted diffusion involving the cortex more diffusely over the right cerebral convexity favored to reflect an acute MCA territory infarct over global hypoxic injury given unilaterality".    PT Comments    Pt alert, in bed, motivated for therapy upon arrival. Pt transferred to [facility] via right lateral scooting with min-guard and min-assist to facilitate LUE, LLE usage. PT assisted with pericare, brief change (maxAx2 for safety).  Progressed treatment to 5 sit-to-stands w/ mod-A x2 handheld for safety, LUE facilitation. Pt continues to rely on RUE, RLE for strength and support due to L side weakness, but is able to demonstrate LUE/LLE involvement with positioning and assistance. Pt continued gait training w/ R handrail and LUE support w/ mod-assist for a total of 45 ft (15 ft ambulation preceded by a rest break x 3). Increased duration of rest breaks provided due to high level of previous activity (5xSTS) due to increased RR noted with ambulation. Overall, pt is consistently motivated for treatment and continues to be goal oriented with tasks. Skilled PT intervention is indicated to address deficits in function, mobility, and to return to PLOF as able. Recommendation remains appropriate.    Follow Up Recommendations  CIR     Equipment Recommendations  Other (comment) (TBD next venue of care)    Recommendations for Other Services       Precautions / Restrictions Precautions Precautions: Fall Precaution  Comments: PEG Restrictions Weight Bearing Restrictions: No    Mobility  Bed Mobility Overal bed mobility: Needs Assistance Bed Mobility: Supine to Sit Rolling: Supervision   Supine to sit: HOB elevated;Supervision     General bed mobility comments: Minimal task cuing required    Transfers Overall transfer level: Needs assistance Equipment used: 2 person hand held assist Transfers: Sit to/from Stand;Lateral/Scoot Transfers Sit to Stand: Mod assist;+2 physical assistance        Lateral/Scoot Transfers: Min assist;Min guard General transfer comment: Able to scoot bed > BSC (going R) w/ min-gaurd to min-A, min-A for LLE blocking and facilitating LUE WB  Ambulation/Gait Ambulation/Gait assistance: +2 safety/equipment;Mod assist Gait Distance (Feet): 45 Feet Assistive device: 1 person hand held assist Gait Pattern/deviations: Decreased step length - right;Decreased step length - left;Decreased dorsiflexion - left;Decreased stride length;Decreased weight shift to left;Decreased stance time - left Gait velocity: decreased   General Gait Details: Increased RR after 15 ft, increased rest time provided   Stairs             Wheelchair Mobility    Modified Rankin (Stroke Patients Only)       Balance Overall balance assessment: Needs assistance Sitting-balance support: Feet supported Sitting balance-Leahy Scale: Good Sitting balance - Comments: Supervision for safety   Standing balance support: Bilateral upper extremity supported;During functional activity Standing balance-Leahy Scale: Poor Standing balance comment: Requires BUE support, relies on RUE and trunk musculature for balance; demonstrates hip extension with cues                            Cognition Arousal/Alertness: Awake/alert   Overall  Cognitive Status: Impaired/Different from baseline Area of Impairment: Orientation;Following commands;Safety/judgement;Attention;Rancho level;Memory                Rancho Levels of Cognitive Functioning Rancho Los Amigos Scales of Cognitive Functioning: Confused/appropriate Orientation Level: Disoriented to;Place Current Attention Level: Selective   Following Commands: Follows one step commands consistently Safety/Judgement: Decreased awareness of deficits;Decreased awareness of safety     General Comments: Able to recall caregiver names from yesterday's tx session      Exercises Other Exercises Other Exercises: bed > BSC for BM, lateral scooting, w/supervision, donning brief, performing pericare Other Exercises: 5 STS mod-A x 2 hand held support    General Comments        Pertinent Vitals/Pain Pain Assessment: No/denies pain    Home Living                      Prior Function            PT Goals (current goals can now be found in the care plan section) Acute Rehab PT Goals Patient Stated Goal: get better, work with therapy PT Goal Formulation: With patient Time For Goal Achievement: 03/13/21 Potential to Achieve Goals: Good Progress towards PT goals: Progressing toward goals    Frequency    Min 5X/week      PT Plan Current plan remains appropriate    Co-evaluation              AM-PAC PT "6 Clicks" Mobility   Outcome Measure  Help needed turning from your back to your side while in a flat bed without using bedrails?: A Little Help needed moving from lying on your back to sitting on the side of a flat bed without using bedrails?: A Little Help needed moving to and from a bed to a chair (including a wheelchair)?: A Lot Help needed standing up from a chair using your arms (e.g., wheelchair or bedside chair)?: A Lot Help needed to walk in hospital room?: Total Help needed climbing 3-5 steps with a railing? : Total 6 Click Score: 12    End of Session Equipment Utilized During Treatment: Gait belt Activity Tolerance: Patient tolerated treatment well Patient left: with call bell/phone within  reach;in chair;with chair alarm set   PT Visit Diagnosis: Other abnormalities of gait and mobility (R26.89);Muscle weakness (generalized) (M62.81);Other symptoms and signs involving the nervous system (R29.898);Hemiplegia and hemiparesis Hemiplegia - Right/Left: Left Hemiplegia - dominant/non-dominant: Non-dominant Hemiplegia - caused by: Cerebral infarction     Time: 2423-5361 PT Time Calculation (min) (ACUTE ONLY): 60 min  Charges:                        [employer], SPT

## 2021-03-02 NOTE — Progress Notes (Signed)
Chaplain Maggie made follow up visit with patient at bedside. He expressed that he is feeling much better today. It was obvious his spirits are uplifted. Patient shared how good it felt to walk 45 steps. He noted, "I am going to get better". Chaplain made room for storytelling and listening. Patient would like his brother to bring him some of his drawings from the house. Chaplain will follow up with his brother and expects to continue visitation to offer support.

## 2021-03-02 NOTE — TOC Progression Note (Signed)
Transition of Care [facility]) - Progression Note    Patient Details  Name: Jeremy Browning MRN: [id number] Date of Birth: 09-Mar-1961  Transition of Care [facility]) CM/SW Contact  Chapman Fitch, RN Phone Number: 03/02/2021, 2:46 PM  Clinical Narrative:     Still no LTC bed offers.  TOC still continuing placement   Expected Discharge Plan: Skilled Nursing Facility Barriers to Discharge: Continued Medical Work up  Expected Discharge Plan and Services Expected Discharge Plan: Skilled Nursing Facility In-house Referral: Clinical Social Work   Post Acute Care Choice: Skilled Nursing Facility                                         Social Determinants of Health (SDOH) Interventions    Readmission Risk Interventions No flowsheet data found.

## 2021-03-02 NOTE — Progress Notes (Signed)
48 Hour Calorie Count Day 2  Estimated Nutritional Needs:    Kcal:  2500-2800 kcal/d Protein:  125-140g/day Fluid:  2.4-2.7L/day  Dinner 7/12: 60% pureed prok, mashed potatoes, carrots, peas and Magic Cup  Total- 535kcal and 20g protein   Breakfast 7/13: 100% pureed pancake, sausage, cream of wheat, applesauce and milk   Total- 785kcal and 38g protein   Lunch 7/13: 100% grilled chicken, mashed potatoes, broccholi, pineapple, Magic Cup   Total- 708kcal and 33g protein   Total Intake:  2028kcal (81% estimated needs) and  g protein (72% estimated needs)  Betsey Holiday MS, RD, LDN Please refer to [facility] for RD and/or RD on-call/weekend/after hours pager

## 2021-03-03 DIAGNOSIS — I469 Cardiac arrest, cause unspecified: Secondary | ICD-10-CM | POA: Diagnosis not present

## 2021-03-03 LAB — GLUCOSE, CAPILLARY
Glucose-Capillary: 97 mg/dL (ref 70–99)
Glucose-Capillary: 127 mg/dL — ABNORMAL HIGH (ref 70–99)
Glucose-Capillary: 204 mg/dL — ABNORMAL HIGH (ref 70–99)
Glucose-Capillary: 201 mg/dL — ABNORMAL HIGH (ref 70–99)

## 2021-03-03 LAB — CBC
HCT: 35.5 % — ABNORMAL LOW (ref 39.0–52.0)
Hemoglobin: 10.8 g/dL — ABNORMAL LOW (ref 13.0–17.0)
MCH: 25 pg — ABNORMAL LOW (ref 26.0–34.0)
MCHC: 30.4 g/dL (ref 30.0–36.0)
MCV: 82.2 fL (ref 80.0–100.0)
Platelets: 167 10*3/uL (ref 150–400)
RBC: 4.32 MIL/uL (ref 4.22–5.81)
RDW: 17.6 % — ABNORMAL HIGH (ref 11.5–15.5)
WBC: 13.2 10*3/uL — ABNORMAL HIGH (ref 4.0–10.5)
nRBC: 0 % (ref 0.0–0.2)

## 2021-03-03 MED ORDER — POLYETHYLENE GLYCOL 3350 17 G PO PACK
17.0000 g | PACK | Freq: Every day | ORAL | Status: DC
  Administered 2021-03-03 – 2021-03-08 (×5): 17 g via ORAL
  Filled 2021-03-03 (×4): qty 1

## 2021-03-03 NOTE — Progress Notes (Signed)
Occupational Therapy Treatment Patient Details Name: Jeremy Browning MRN: [id number] DOB: October 04, 1960 Today's Date: 03/03/2021    History of present illness Pt is a 60 yo male that presented from home with respiratory distress 12/25/2020. Underwent defibrillation and CPR, emergently intubated. During admission pt has had a cardiac cath, peg tube and tracheostomy placed, weaned from vent 5/26 AM. 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 Mildly restricted diffusion involving the cortex more diffusely over the right cerebral convexity favored to reflect an acute MCA territory infarct over global hypoxic injury given unilaterality".   OT comments  Pt awake and in bed upon OT arrival, willing to participate with OT this session.  Assisted pt to EOB and participated in ADL transfers.  Utilized L hand walker splint to facilitate WB through L hand into walker during standing and transfers.  Pt tolerated SPT from bed to transfer tub bench with mod A +2.  Sit to stand from tub bench x 2 trials, both with mod A of 2.  Able to stand for therapist to don brief, with vc for erect posture.  Participated in dynamic sitting tasks reaching minimally outside BOS in sitting in all directions reaching L and R hands to target without LOB.  Tolerated seated grooming to brush teeth with min A for set up of toothpaste on toothbrush, and cues for thoroughness.  Pt pleased with his performance but with reported fatigue.  OT left pt sitting up in recliner with PT at end of session.    Follow Up Recommendations  SNF;Supervision/Assistance - 24 hour    Equipment Recommendations  Other (comment)    Recommendations for Other Services      Precautions / Restrictions Precautions Precautions: Fall Precaution Comments: PEG Required Braces or Orthoses: Other Brace Other Brace: L hand walker splint Restrictions Weight Bearing Restrictions: No       Mobility Bed  Mobility Overal bed mobility: Needs Assistance Bed Mobility: Supine to Sit Rolling: Supervision   Supine to sit: HOB elevated;Supervision Sit to supine: Supervision   General bed mobility comments: Minimal task cuing required    Transfers Overall transfer level: Needs assistance Equipment used: Rolling walker (2 wheeled) (L hand walker splint) Transfers: Sit to/from Stand Sit to Stand: Mod assist;+2 physical assistance Stand pivot transfers: Mod assist;+2 physical assistance            Balance Overall balance assessment: Needs assistance Sitting-balance support: No upper extremity supported (intermittent support of RUE) Sitting balance-Leahy Scale: Good Sitting balance - Comments: able to reach minimally outside BOS in all directions with L and RUE without LOB   Standing balance support: Bilateral upper extremity supported;During functional activity Standing balance-Leahy Scale: Poor Standing balance comment: rounded shoulders, FHP; able to adjust to more erect posture with vc but unable to maintain                           ADL either performed or assessed with clinical judgement   ADL Overall ADL's : Needs assistance/impaired     Grooming: Minimal assistance;Oral care Grooming Details (indicate cue type and reason): mod sequencing cues to thoroughly brush teeth sitting up on tub bench                 Toilet Transfer: +2 for physical assistance;Moderate assistance Toilet Transfer Details (indicate cue type and reason): sequencing cues and assist to manage LUE in walker hand splint  Functional mobility during ADLs: Moderate assistance;+2 for safety/equipment General ADL Comments: sit<>stand<>3 steps to transfer tub bench at bedside                       Cognition Arousal/Alertness: Awake/alert Behavior During Therapy: WFL for tasks assessed/performed Overall Cognitive Status: Impaired/Different from baseline Area of Impairment:  Orientation;Following commands;Safety/judgement;Attention;Rancho level;Memory               Rancho Levels of Cognitive Functioning Rancho Los Amigos Scales of Cognitive Functioning: Confused/appropriate Orientation Level: Disoriented to;Place Current Attention Level: Selective Memory: Decreased short-term memory Following Commands: Follows one step commands consistently Safety/Judgement: Decreased awareness of deficits;Decreased awareness of safety     General Comments: Able to recall caregiver names from yesterday's tx session                   Pertinent Vitals/ Pain       Pain Score: 4  Pain Location: RLQ abdomen, L hand Pain Descriptors / Indicators: Sore;Aching Pain Intervention(s): Repositioned   Frequency  Min 2X/week        Progress Toward Goals  OT Goals(current goals can now be found in the care plan section)  Progress towards OT goals: Progressing toward goals  Acute Rehab OT Goals Patient Stated Goal: get better, work with therapy OT Goal Formulation: With patient Time For Goal Achievement: 03/15/21 Potential to Achieve Goals: Fair  Plan Discharge plan remains appropriate;Frequency remains appropriate    Co-evaluation                 AM-PAC OT "6 Clicks" Daily Activity     Outcome Measure   Help from another person eating meals?: A Little Help from another person taking care of personal grooming?: A Little Help from another person toileting, which includes using toliet, bedpan, or urinal?: A Lot Help from another person bathing (including washing, rinsing, drying)?: A Lot Help from another person to put on and taking off regular upper body clothing?: A Little Help from another person to put on and taking off regular lower body clothing?: A Lot 6 Click Score: 15    End of Session    OT Visit Diagnosis: Other symptoms and signs involving the nervous system (R29.898);Other symptoms and signs involving cognitive function   Activity  Tolerance Patient tolerated treatment well   Patient Left Other (comment) (left sitting up in recliner with PT present)   Nurse Communication Mobility status;Precautions        Time: 5284-1324 OT Time Calculation (min): 39 min  Charges: OT General Charges $OT Visit: 1 Visit OT Treatments $Self Care/Home Management : 23-37 mins $Neuromuscular Re-education: 8-22 mins   Danelle Earthly, MS, OTR/L  [employer] 03/03/2021, 3:44 PM

## 2021-03-03 NOTE — Progress Notes (Addendum)
[facility] PROGRESS NOTE  Patient: Jeremy Browning LAG:[id number]   PCP: No primary care provider on file. DOB: 1960-10-25   DOA: 12/25/2020   DOS: 03/03/2021    Subjective: No acute complaint.  No nausea no vomiting.  Breathing okay.  Oral intake improving.  Calorie count ongoing.  Objective:  Vitals:   03/03/21 0810 03/03/21 1626  BP: (!) 142/56 (!) 120/44  Pulse: (!) 51 75  Resp: 18 17  Temp: 97.7 F (36.5 C) 99.2 F (37.3 C)  SpO2: 99% 99%    Alert and awake.  Oriented to self and place. Clear to auscultation.  S1-S2 present.  Regular.  Bowel sounds present.   Assessment and plan: Accelerated hypertension. Blood pressure now improving. Continue current regimen.  Dysphagia. Appreciate dietary consultation. Pt is meeting 100% of his needs via supplements and meals. Tube feeds discontinued. He needs to be monitored for at least 3-4 weeks to make sure that he is going to be able to maintain his oral intake and weight before having that PEG tube removed.    Author: Lynden Oxford, MD [facility] 03/03/2021 5:43 PM   If 7PM-7AM, please contact night-coverage at [email or web address]

## 2021-03-03 NOTE — Progress Notes (Signed)
  Speech Language Pathology Treatment: Dysphagia;Cognitive-Linquistic  Patient Details Name: Jeremy Browning MRN: [id number] DOB: [birth year] Today's Date: 03/02/2021 Time: 0277-4128    Assessment / Plan / Recommendation Clinical Impression  Pt seen for ongoing cognition and dysphagia treatment. Skilled observation provided of pt consuming graham crackers. He feed himself using right hand and as a result, he placed the graham cracker on the right side of his mouth. He demonstrated effective mastication with no oral residue post swallow. Pt consumed thin liquids via straw in between bites without any overt s/s of aspiration. At this time, recommend upgrading pt's diet to dysphagia 2, medicine whole with thin liquids. Pt also seen for cognition treatment. Pt required moderate assistance to read wall calendar and required extensive education that calendar was reflective of current date. Pt able to recall information from calendar after a 5 minute delay.   HPI HPI: Pt is a 60 yo male that presented from home with respiratory distress 12/25/2020. Underwent defibrillation and CPR, emergently intubated. During admission pt has had a cardiac cath, peg tube and tracheostomy placed, weaned from vent 01/13/2021. 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 Mildly restricted diffusion involving the cortex more diffusely over the right cerebral convexity favored to reflect an acute MCA territory infarct over global hypoxic injury given unilaterality".  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 Pt currently has a #6 cuffless Shiley with PMSV worn during all waking hours.      SLP Plan  Continue with current plan of care       Recommendations  Liquids provided via: Straw Medication Administration: Whole meds with liquid Supervision:  Staff to assist with self feeding Compensations: Minimize environmental distractions;Slow rate;Small sips/bites;Lingual sweep for clearance of pocketing Postural Changes and/or Swallow Maneuvers: Seated upright 90 degrees;Upright 30-60 min after meal                Follow up Recommendations: Skilled Nursing facility SLP Visit Diagnosis: Dysphagia, oral phase (R13.11);Cognitive communication deficit (R41.841) Plan: Continue with current plan of care       GO              Kaelei Wheeler B. Dreama Saa M.S., CCC-SLP, [facility] Speech-Language Pathologist Rehabilitation Services Office [phone]

## 2021-03-03 NOTE — Progress Notes (Signed)
Physical Therapy Treatment Patient Details Name: Jeremy Browning MRN: [id number] DOB: 1961/04/22 Today's Date: 03/03/2021    History of Present Illness Pt is a 60 yo male that presented from home with respiratory distress 12/25/2020. Underwent defibrillation and CPR, emergently intubated. During admission pt has had a cardiac cath, peg tube and tracheostomy placed, weaned from vent 5/26 AM. 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 Mildly restricted diffusion involving the cortex more diffusely over the right cerebral convexity favored to reflect an acute MCA territory infarct over global hypoxic injury given unilaterality".    PT Comments    Pt alert, working with OT, pt seated on BSC. Pt motivate with therapy. Progressed ambulation to RW w/ LUE hand splint requiring min-assist for intermittent instability, cues for advancement. Sit to stand requires max-A x 2 for safety and physical momentum. Pt noted R medial knee pain with continued WB activities, pain decreased at rest. Skilled PT intervention is indicated to address deficits in function, mobility, and to return to PLOF as able.  Discharge recommendations remain CIR.   Follow Up Recommendations  CIR     Equipment Recommendations  Other (comment) (TBD next venue of care)    Recommendations for Other Services       Precautions / Restrictions Precautions Precautions: Fall Precaution Comments: PEG Required Braces or Orthoses: Other Brace Other Brace: L hand walker splint Restrictions Weight Bearing Restrictions: No    Mobility  Bed Mobility Overal bed mobility: Needs Assistance Bed Mobility: Supine to Sit Rolling: Supervision   Supine to sit: HOB elevated;Supervision Sit to supine: Supervision   General bed mobility comments: Pt seated in recliner beginning of session    Transfers Overall transfer level: Needs assistance Equipment used: Rolling walker (2 wheeled) (w/ LUE  hand splint) Transfers: Sit to/from Stand Sit to Stand: Max assist;+2 physical assistance Stand pivot transfers: Mod assist;+2 physical assistance      Lateral/Scoot Transfers: Min assist;Min guard    Ambulation/Gait Ambulation/Gait assistance: +2 safety/equipment;Min assist Gait Distance (Feet): 15 Feet Assistive device: Rolling walker (2 wheeled) (LUE hand splint) Gait Pattern/deviations: Decreased step length - right;Decreased step length - left;Decreased dorsiflexion - left;Decreased stride length;Decreased weight shift to left;Decreased stance time - left Gait velocity: decreased       Stairs             Wheelchair Mobility    Modified Rankin (Stroke Patients Only)       Balance Overall balance assessment: Needs assistance Sitting-balance support: No upper extremity supported Sitting balance-Leahy Scale: Good Sitting balance - Comments: able to reach minimally outside BOS in all directions with L and RUE without LOB   Standing balance support: Bilateral upper extremity supported;During functional activity Standing balance-Leahy Scale: Poor Standing balance comment: Unable to obtain full trunk extension with standing, increased hip flexion                            Cognition Arousal/Alertness: Awake/alert Behavior During Therapy: WFL for tasks assessed/performed Overall Cognitive Status: Within Functional Limits for tasks assessed Area of Impairment: Orientation;Following commands;Safety/judgement;Attention;Rancho level;Memory               Rancho Levels of Cognitive Functioning Rancho Los Amigos Scales of Cognitive Functioning: Confused/appropriate Orientation Level: Disoriented to;Place Current Attention Level: Selective Memory: Decreased short-term memory Following Commands: Follows one step commands consistently Safety/Judgement: Decreased awareness of deficits;Decreased awareness of safety     General Comments: Able  to recall  caregiver names from yesterday's tx session      Exercises Other Exercises Other Exercises: Standing weight shifts w / RW, min-A supporting L side Other Exercises: 3 STS max-A x 2 for physical and safety support    General Comments        Pertinent Vitals/Pain Pain Assessment: Faces Pain Score: 4  Faces Pain Scale: Hurts little more Pain Location: RLQ abdomen, L hand Pain Descriptors / Indicators: Guarding;Grimacing;Sore Pain Intervention(s): Limited activity within patient's tolerance;Repositioned;Monitored during session    Home Living                      Prior Function            PT Goals (current goals can now be found in the care plan section) Acute Rehab PT Goals Patient Stated Goal: get better, work with therapy PT Goal Formulation: With patient Time For Goal Achievement: 03/13/21 Potential to Achieve Goals: Good Progress towards PT goals: Progressing toward goals    Frequency    Min 5X/week      PT Plan Current plan remains appropriate    Co-evaluation              AM-PAC PT "6 Clicks" Mobility   Outcome Measure  Help needed turning from your back to your side while in a flat bed without using bedrails?: A Little Help needed moving from lying on your back to sitting on the side of a flat bed without using bedrails?: A Little Help needed moving to and from a bed to a chair (including a wheelchair)?: A Lot Help needed standing up from a chair using your arms (e.g., wheelchair or bedside chair)?: A Lot Help needed to walk in hospital room?: A Lot Help needed climbing 3-5 steps with a railing? : Total 6 Click Score: 13    End of Session Equipment Utilized During Treatment: Gait belt Activity Tolerance: Patient limited by pain;Patient limited by fatigue;Patient tolerated treatment well Patient left: in bed;with call bell/phone within reach;with bed alarm set   PT Visit Diagnosis: Other abnormalities of gait and mobility (R26.89);Muscle  weakness (generalized) (M62.81);Other symptoms and signs involving the nervous system (R29.898);Hemiplegia and hemiparesis Hemiplegia - Right/Left: Left Hemiplegia - dominant/non-dominant: Non-dominant Hemiplegia - caused by: Cerebral infarction     Time: 1516-     Charges:                        Hosie Spangle, SPT

## 2021-03-04 DIAGNOSIS — E875 Hyperkalemia: Secondary | ICD-10-CM

## 2021-03-04 LAB — GLUCOSE, CAPILLARY
Glucose-Capillary: 97 mg/dL (ref 70–99)
Glucose-Capillary: 147 mg/dL — ABNORMAL HIGH (ref 70–99)
Glucose-Capillary: 170 mg/dL — ABNORMAL HIGH (ref 70–99)
Glucose-Capillary: 173 mg/dL — ABNORMAL HIGH (ref 70–99)

## 2021-03-04 NOTE — Progress Notes (Signed)
Occupational Therapy Treatment Patient Details Name: Jeremy Browning MRN: [id number] DOB: 16-Oct-1960 Today's Date: 03/04/2021    History of present illness Pt is a 60 yo male that presented from home with respiratory distress 12/25/2020. Underwent defibrillation and CPR, emergently intubated. During admission pt has had a cardiac cath, peg tube and tracheostomy placed, weaned from vent 5/26 AM. 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 Mildly restricted diffusion involving the cortex more diffusely over the right cerebral convexity favored to reflect an acute MCA territory infarct over global hypoxic injury given unilaterality".   OT comments  Pt in bed upon OT arrival, reporting that he needed to have a BM.  Pt agreeable to transfer to [facility].  Performed supine<>sit with supv and extra time.  Performed scoot transfer to commode with min guard. Pt was able to have BM. Pt participated in toilet hygiene with min A for sequencing and stabilizing while performing lateral lean to the L.  OT assisted with WB into LUE during lean.  Assisted with gown change mod A d/t soilet from catheter leakage, dep for doffing/donning new socks, but good attempt from pt to lean forward to doff socks, reaching to top of ankle without LOB.  Able to stand pivot with mod A +2 with RW to return to EOB sitting.  OT left pt EOB with PT present.     Follow Up Recommendations  SNF;Supervision/Assistance - 24 hour    Equipment Recommendations  Other (comment) (defer to next venue of care)    Recommendations for Other Services      Precautions / Restrictions Precautions Precautions: Fall Precaution Comments: PEG Required Braces or Orthoses: Other Brace Other Brace: L hand walker splint Restrictions Weight Bearing Restrictions: No       Mobility Bed Mobility Overal bed mobility: Needs Assistance Bed Mobility: Supine to Sit Rolling: Supervision   Supine to sit: HOB  elevated;Supervision     General bed mobility comments: Pt seated at EOB at end of session with PT present Patient Response: Cooperative  Transfers Overall transfer level: Needs assistance Equipment used: Rolling walker (2 wheeled) (L hand walker splint)   Sit to Stand: Mod assist;+2 physical assistance Stand pivot transfers: Mod assist;+2 physical assistance      Lateral/Scoot Transfers: Min guard General transfer comment: scooted bed to commode with min guard, SPT from commode back to bed with mod A +2    Balance Overall balance assessment: Needs assistance Sitting-balance support: No upper extremity supported Sitting balance-Leahy Scale: Good Sitting balance - Comments: able to reach to top of R ankle this day to attempt to doff sock   Standing balance support: Bilateral upper extremity supported;During functional activity Standing balance-Leahy Scale: Poor Standing balance comment: Unable to obtain full trunk extension with standing, increased hip flexion                           ADL either performed or assessed with clinical judgement   ADL Overall ADL's : Needs assistance/impaired     Grooming: Moderate assistance;Wash/dry hands Grooming Details (indicate cue type and reason): cues to incorporate L hand and for thorough cleaning between fingers         Upper Body Dressing : Moderate assistance Upper Body Dressing Details (indicate cue type and reason): to doff soiled gown and don new     Toilet Transfer: Minimal assistance;BSC Toilet Transfer Details (indicate cue type and reason): to scoot from bed to  bedside commode bench; cues for sequencing and bearing weight into LLE during transfer; 2+ mod A for SPT from commode back to bed with walker Toileting- Clothing Manipulation and Hygiene: Minimal assistance Toileting - Clothing Manipulation Details (indicate cue type and reason): OT provided set up for wet wipes and min A for L lateral lean to clean after  BM using RUE     Functional mobility during ADLs: Moderate assistance;+2 for safety/equipment General ADL Comments: sit<>stand<>3 steps from transfer tub bench back to EOB     Vision Baseline Vision/History: No visual deficits Additional Comments: further assessment needed   Perception     Praxis      Cognition Arousal/Alertness: Awake/alert Behavior During Therapy: WFL for tasks assessed/performed Overall Cognitive Status: Within Functional Limits for tasks assessed Area of Impairment: Orientation;Following commands;Safety/judgement;Attention;Rancho level;Memory               Rancho Levels of Cognitive Functioning Rancho Los Amigos Scales of Cognitive Functioning: Confused/appropriate Orientation Level: Disoriented to;Place Current Attention Level: Selective Memory: Decreased short-term memory Following Commands: Follows one step commands consistently Safety/Judgement: Decreased awareness of deficits;Decreased awareness of safety     General Comments: Able to recall caregiver names from yesterday's tx session                          Pertinent Vitals/ Pain       Pain Assessment: 0-10 Pain Score: 2  Pain Location: L hand discomfort from use of walker splint Pain Descriptors / Indicators: Guarding;Grimacing;Sore Pain Intervention(s): Monitored during session;Limited activity within patient's tolerance                                                          Frequency  Min 2X/week        Progress Toward Goals  OT Goals(current goals can now be found in the care plan section)  Progress towards OT goals: Progressing toward goals  Acute Rehab OT Goals Patient Stated Goal: get better, work with therapy OT Goal Formulation: With patient Time For Goal Achievement: 03/15/21 Potential to Achieve Goals: Fair  Plan Discharge plan remains appropriate;Frequency remains appropriate                     AM-PAC OT "6 Clicks"  Daily Activity     Outcome Measure   Help from another person eating meals?: A Little Help from another person taking care of personal grooming?: A Little Help from another person toileting, which includes using toliet, bedpan, or urinal?: A Lot Help from another person bathing (including washing, rinsing, drying)?: A Lot Help from another person to put on and taking off regular upper body clothing?: A Little Help from another person to put on and taking off regular lower body clothing?: A Lot 6 Click Score: 15    End of Session Equipment Utilized During Treatment: Gait belt;Rolling walker  OT Visit Diagnosis: Other symptoms and signs involving the nervous system (R29.898);Other symptoms and signs involving cognitive function;Muscle weakness (generalized) (M62.81)   Activity Tolerance Patient tolerated treatment well   Patient Left Other (comment) (sitting EOB with PT present)   Nurse Communication Precautions;Mobility status;Other (comment) (pt able to have BM and urinate; assisted to don clean gown and clean linens on bed)  Time: 5916-3846 OT Time Calculation (min): 32 min  Charges: OT General Charges $OT Visit: 1 Visit OT Treatments $Self Care/Home Management : 23-37 mins  Danelle Earthly, MS, OTR/L    Otis Dials 03/04/2021, 11:45 AM

## 2021-03-04 NOTE — Progress Notes (Signed)
Physical Therapy Treatment Patient Details Name: Jeremy Browning MRN: [id number] DOB: 1960-10-07 Today's Date: 03/04/2021    History of Present Illness Pt is a 60 yo male that presented from home with respiratory distress 12/25/2020. Underwent defibrillation and CPR, emergently intubated. During admission pt has had a cardiac cath, peg tube and tracheostomy placed, weaned from vent 5/26 AM. 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 Mildly restricted diffusion involving the cortex more diffusely over the right cerebral convexity favored to reflect an acute MCA territory infarct over global hypoxic injury given unilaterality".    PT Comments    Pt alert, working with OT beginning of session. Pt continues to demonstrate progress with functional activities. Pt is now able to scoot bed > chair (to the right) with min-guard for safety. This session, pt was able to transfer with min-assist x 1, still requires mod assist when fatigued. Ambulated with min-guard 45 ft, with RW equipped with L hand splint. Endurance and strength limit further walked distance, but gait speed increased from previous treatment sessions. Skilled PT intervention is indicated to address deficits in function, mobility, and to return to PLOF as able.  Discharge recommendations remain CIR.  Follow Up Recommendations  CIR     Equipment Recommendations  Other (comment)    Recommendations for Other Services       Precautions / Restrictions Precautions Precautions: Fall Precaution Comments: PEG Required Braces or Orthoses: Other Brace (resting hand splint at PM) Other Brace: L hand walker splint Restrictions Weight Bearing Restrictions: No    Mobility  Bed Mobility Overal bed mobility: Needs Assistance Bed Mobility: Supine to Sit Rolling: Supervision   Supine to sit: HOB elevated;Supervision     General bed mobility comments: Pt seated at EOB at end of session with OT  present    Transfers Overall transfer level: Needs assistance Equipment used: Rolling walker (2 wheeled) Transfers: Sit to/from Stand Sit to Stand: Min assist;Mod assist Stand pivot transfers: Mod assist;+2 physical assistance      Lateral/Scoot Transfers: Min guard General transfer comment: Scooted bed > recliner  Ambulation/Gait Ambulation/Gait assistance: +2 safety/equipment;Min guard Gait Distance (Feet): 45 Feet Assistive device: Rolling walker (2 wheeled) Gait Pattern/deviations: Decreased step length - right;Decreased step length - left;Decreased dorsiflexion - left;Decreased dorsiflexion - right;Decreased weight shift to left Gait velocity: Increased from session 7/14       Stairs             Wheelchair Mobility    Modified Rankin (Stroke Patients Only)       Balance Overall balance assessment: Needs assistance Sitting-balance support: No upper extremity supported;Feet supported Sitting balance-Leahy Scale: Good Sitting balance - Comments: able to reach to top of R ankle this day to attempt to doff sock   Standing balance support: Bilateral upper extremity supported;During functional activity Standing balance-Leahy Scale: Poor Standing balance comment: Requires BUE support, able to stand statically w/ supervision                            Cognition Arousal/Alertness: Awake/alert Behavior During Therapy: WFL for tasks assessed/performed Overall Cognitive Status: Within Functional Limits for tasks assessed Area of Impairment: Orientation;Following commands;Safety/judgement;Attention;Rancho level;Memory               Rancho Levels of Cognitive Functioning Rancho Los Amigos Scales of Cognitive Functioning: Confused/appropriate Orientation Level: Time Current Attention Level: Selective Memory: Decreased short-term memory Following Commands: Follows one step commands  consistently Safety/Judgement: Decreased awareness of  deficits;Decreased awareness of safety     General Comments: Recalls portions of conversation from yesterday's session      Exercises Other Exercises Other Exercises: Donning brief, 2xSTS w/ mod-assist x1 hand held assist    General Comments        Pertinent Vitals/Pain Pain Assessment: No/denies pain Pain Score: 2  Pain Location: L hand discomfort from use of walker splint Pain Descriptors / Indicators: Guarding;Grimacing;Sore Pain Intervention(s): Monitored during session;Limited activity within patient's tolerance    Home Living                      Prior Function            PT Goals (current goals can now be found in the care plan section) Acute Rehab PT Goals Patient Stated Goal: get better, work with therapy PT Goal Formulation: With patient Time For Goal Achievement: 03/13/21 Potential to Achieve Goals: Good Progress towards PT goals: Progressing toward goals    Frequency    Min 5X/week      PT Plan Current plan remains appropriate    Co-evaluation              AM-PAC PT "6 Clicks" Mobility   Outcome Measure  Help needed turning from your back to your side while in a flat bed without using bedrails?: A Little Help needed moving from lying on your back to sitting on the side of a flat bed without using bedrails?: A Little Help needed moving to and from a bed to a chair (including a wheelchair)?: A Little Help needed standing up from a chair using your arms (e.g., wheelchair or bedside chair)?: A Lot Help needed to walk in hospital room?: A Lot Help needed climbing 3-5 steps with a railing? : Total 6 Click Score: 14    End of Session Equipment Utilized During Treatment: Gait belt Activity Tolerance: Patient tolerated treatment well Patient left: in chair;with call bell/phone within reach;with chair alarm set;with nursing/sitter in room Nurse Communication: Mobility status PT Visit Diagnosis: Other abnormalities of gait and mobility  (R26.89);Muscle weakness (generalized) (M62.81);Other symptoms and signs involving the nervous system (R29.898);Hemiplegia and hemiparesis Hemiplegia - Right/Left: Left Hemiplegia - dominant/non-dominant: Non-dominant Hemiplegia - caused by: Cerebral infarction     Time: 1120-1200 PT Time Calculation (min) (ACUTE ONLY): 40 min  Charges:                       [employer], SPT

## 2021-03-04 NOTE — Consult Note (Signed)
Brief cardiology consult note  Impression Tachybradycardia syndrome Bradycardia Benign arrhythmias Obesity Possible obstructive sleep apnea Hypertension Dysphagia Cardiomyopathy Hyperlipidemia  Plan Recommend conservative management Continue heart failure therapy losartan hydralazine Coreg Agree with diabetes management and control with insulin Recommend sleep study for evaluation of possible obstructive sleep apnea No clinically for permanent pacemaker Maintain normal electrolytes Continue Lipitor therapy for lipid management Inhalers as necessary for possible asthma COPD Do not recommend any modification in therapy for this asymptomatic arrhythmia Continue famotidine for reflux type symptoms consider adding omeprazole Recommend have the patient follow-up with [facility] they have seen him in the past as recently as May 2022

## 2021-03-04 NOTE — Progress Notes (Signed)
[facility] PROGRESS NOTE  Patient: Jeremy Browning OIB:[id number]   PCP: No primary care provider on file. DOB: 05-23-1961   DOA: 12/25/2020   DOS: 03/04/2021    Subjective: No nausea no vomiting.  No fever chest pain.  Objective:  Vitals:   03/04/21 1519 03/04/21 1655  BP: (!) 130/49 (!) 117/36  Pulse: (!) 58 (!) 56  Resp: (!) 24 (!) 22  Temp: 98.6 F (37 C)   SpO2: 100% 100%    S1-S2 present. Clear to auscultation.  Assessment and plan: Continuing to tolerate oral diet. Continue current regimen. Moderate negative for CIR. 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  Author: Lynden Oxford, MD [facility] 03/04/2021 7:03 PM   If 7PM-7AM, please contact night-coverage at [email or web address]

## 2021-03-04 NOTE — Progress Notes (Signed)
Chaplain Maggie made follow up visit with patient at bedside. He shared that his brother visited him the day before yesterday and that made him feel better. Patient continues to improve and looks forward to walking with the pink walker in his room. Expressing through drawing is helpful to the patient as is his [place] faith. Chaplain will follow up to continue to assess ways to be supportive to patient in the healing process.

## 2021-03-05 LAB — GLUCOSE, CAPILLARY
Glucose-Capillary: 102 mg/dL — ABNORMAL HIGH (ref 70–99)
Glucose-Capillary: 155 mg/dL — ABNORMAL HIGH (ref 70–99)
Glucose-Capillary: 231 mg/dL — ABNORMAL HIGH (ref 70–99)
Glucose-Capillary: 189 mg/dL — ABNORMAL HIGH (ref 70–99)

## 2021-03-05 MED ORDER — CARVEDILOL 6.25 MG PO TABS
6.2500 mg | ORAL_TABLET | Freq: Two times a day (BID) | ORAL | Status: DC
  Administered 2021-03-05 – 2021-03-08 (×6): 6.25 mg via ORAL
  Filled 2021-03-05 (×6): qty 1

## 2021-03-05 MED ORDER — AMLODIPINE BESYLATE 5 MG PO TABS
5.0000 mg | ORAL_TABLET | Freq: Every day | ORAL | Status: DC
  Administered 2021-03-05: 5 mg via ORAL
  Filled 2021-03-05: qty 1

## 2021-03-05 NOTE — Progress Notes (Signed)
[facility] Progress Note  Patient: Jeremy Browning    OZY:[id number]  DOA: 12/25/2020     Date of Service: the patient was seen and examined on 03/05/2021  Brief hospital course: Jeremy Browning is an 60 y.o. male with obesity, DM2, HTN, combined CHF who was admitted on 12/25/2020 with respiratory distress. He was intubated in the ER had VT requiring epi/cardioversion. Hospital course complicated by persistent vent dependent respiratory failure eventually requiring tracheostomy, anoxic encephalopathy. Patient had coronary angiogram performed 5/7, normal coronary arteries. Echocardiogram showed ejection fraction 45 to 50% with grade 1 diastolic dysfunction. Patient since has improved. [name] is removed.  Patient is able to tolerate oral diet, not receiving tube feeding.  5/7 severe resp failure 5/9 multiorgan failure MRI shows brain injury 5/12 failed weaning trials due to severe brain damage 5/17 no purposeful movement on wake up assessment, becomes tachypneic, asynchronous with vent 5/18 remains critically ill, severe brain damage, patient made DNR 5/20 family decided on TRACH/PEG tube 5/25 [facility] PLACED by ENT 5/26 s/p trach PEG tube 6/2 tolerates Trach collar @ 28% FiO2 6/7: declined for LTAC.  Patient is becoming little more responsive 6/12 mental status better, starting to talk with valve 6/23 d/w ENT rec decanulation and contacted RT and  RT decannulated pt to RA 7/13 tube feeds discontinued.  Tolerating oral diet.  If continues to tolerate adequate calories, remove PEG tube August 1 week.  Currently plan is arrange for safe discharge plan.  Subjective: No acute complaint.  No nausea no vomiting.  Abdomen  Assessment and Plan: Acute hypoxic respiratory failure present on admission. Out-of-hospital respiratory arrest Cardiac arrest-V. tach Acute systolic and diastolic CHF with flash pulm edema Proteus pneumonia EMS found patient with saturation in 70s on room air at home placed  on CPAP. On arrival to ED patient was found unresponsive and pulseless treated while in the ICU, completed a course of antibiotics with ceftriaxone and Unasyn. Require tracheostomy, removed on 6/23. Currently on room air.   Cardiac arrest  V. Tach  non-STEMI, concern for STEMI on admission acute combined systolic and diastolic CHF  cardiogenic shock Requiring pressors while in the ICU, currently resolved. Cardiology consulted and underwent a cardiac cath on 5/7 which showed normal coronaries. 2D echo done on 5/9 showed an EF of 45-50% with global hypokinesis of the left ventricle.   It also showed grade 1 diastolic dysfunction.  Appears euvolemic Currently on carvedilol, losartan.  Will discuss with cardiology regarding transition to [facility]. Was on hydralazine and started on Norvasc. No arrhythmia  Anoxic brain injury. Acute CVA Mentation improving. Still has some confusion. MRI brain shows small acute infarct in right cerebellum and right cerebral hemisphere. Neurology consulted. 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 EEG shows no evidence of acute seizure.  Acute kidney injury Hyperkalemia Hypernatremia Renal function on admission was two-point something.  Currently improved to normal. Potassium level was also elevated on admission.  Currently normal. Sodium level worsen while in the hospital and resolved with treatment. Currently electrolytes and renal function normal.  Type 2 diabetes mellitus uncontrolled with hyperglycemia. Hemoglobin A1c 7.1. Currently sliding scale insulin.  Sick euthyroid syndrome. TSH was 8.2 on admission. Free T4 was 0.6. Will recheck.    Elevated LFTs  Noticed on 6/11, now resolved. Hepatitis panel negative,  right upper quadrant ultrasound negative.   Anemia of chronic disease -hemoglobin stable, no bleeding   Nutrition Patient was on PEG tube feeding. Now mentation improving therefore tolerating p.o. with adequate caloric intake. Tube  feeds have been discontinued. On dysphagia 2 diet. Speech therapy following.  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 Recommends steroids for now with a taper.  Scheduled Meds:  amLODipine  5 mg Oral Daily   vitamin C  250 mg Oral BID   aspirin  81 mg Oral Daily   carvedilol  6.25 mg Oral BID WC   diclofenac Sodium  2 g Topical QID   docusate sodium  100 mg Oral BID   enoxaparin (LOVENOX) injection  0.5 mg/kg Subcutaneous Q24H   famotidine  20 mg Oral Daily   feeding supplement (NEPRO CARB STEADY)  237 mL Oral TID BM   free water  30 mL Per Tube Q4H   insulin aspart  0-15 Units Subcutaneous TID WC   insulin aspart  0-5 Units Subcutaneous QHS   losartan  100 mg Oral Daily   mouth rinse  15 mL Mouth Rinse BID   multivitamin with minerals  1 tablet Per Tube Daily   neomycin-bacitracin-polymyxin   Topical BID   polyethylene glycol  17 g Oral Daily   predniSONE  30 mg Oral Q breakfast   Followed by   Derrill Memo ON 03/07/2021] predniSONE  20 mg Oral Q breakfast   Followed by   Derrill Memo ON 03/12/2021] predniSONE  10 mg Oral Q breakfast   Followed by   Derrill Memo ON 03/17/2021] predniSONE  5 mg Oral Q breakfast   Continuous Infusions: PRN Meds: acetaminophen (TYLENOL) oral liquid 160 mg/5 mL, albuterol, hydrALAZINE, lip balm, oxyCODONE, polyvinyl alcohol  Body mass index is 33.37 kg/m.  Nutrition Problem: Inadequate oral intake Etiology: inability to eat (pt sedated and ventilated) Pressure Injury Buttocks Left (Active)     Location: Buttocks  Location Orientation: Left  Staging:   Wound Description (Comments):   Present on Admission:      Pressure Injury 02/09/21 Buttocks Medial Stage 2 -  Partial thickness loss of dermis presenting as a shallow open injury with a red, pink wound bed without slough. (Active)  02/09/21 1209  Location: Buttocks  Location Orientation: Medial   Staging: Stage 2 -  Partial thickness loss of dermis presenting as a shallow open injury with a red, pink wound bed without slough.  Wound Description (Comments):   Present on Admission:      Pressure Injury 02/09/21 Buttocks Left Stage 2 -  Partial thickness loss of dermis presenting as a shallow open injury with a red, pink wound bed without slough. (Active)  02/09/21 1213  Location: Buttocks  Location Orientation: Left  Staging: Stage 2 -  Partial thickness loss of dermis presenting as a shallow open injury with a red, pink wound bed without slough.  Wound Description (Comments):   Present on Admission:     DVT Prophylaxis: Lovenox  Advance goals of care discussion: Pt is DNR.  Family Communication: no family was present at bedside, at the time of interview.   Physical Exam:  General: Appear in mild distress, no Rash; Oral Mucosa Clear, moist. no Abnormal Neck Mass Or lumps, Conjunctiva normal  Cardiovascular: S1 and S2 Present, no Murmur, Respiratory: good respiratory effort, Bilateral Air entry present and CTA, no Crackles, no wheezes Abdomen: Bowel Sound present, Soft and no tenderness Extremities: no Pedal edema Neurology: alert and oriented to time, place, and person affect appropriate. no new focal deficit Gait not checked due to patient safety concerns  Vitals:  03/04/21 1932 03/05/21 0409 03/05/21 0822 03/05/21 1532  BP: (!) 143/45 (!) 141/45 (!) 141/46 (!) 150/60  Pulse: 63 (!) 46 (!) 55 72  Resp: '16 16 18 18  ' Temp: 99 F (37.2 C) 98 F (36.7 C) 97.9 F (36.6 C) 98.2 F (36.8 C)  TempSrc: Oral Oral Oral Oral  SpO2: 100% 100% 100% 100%  Weight:      Height:       Disposition:  Status is: Inpatient  Remains inpatient appropriate because:Unsafe d/c plan  Dispo: The patient is from: Home              Anticipated d/c is to: to be determined               Patient currently is medically stable to d/c.   Difficult to place patient Yes  Time spent: 35  minutes. I reviewed all nursing notes, pharmacy notes, vitals, pertinent old records. I have discussed plan of care as described above with RN.  Author: Berle Mull, MD [facility] 03/05/2021 5:33 PM  To reach On-call, see care teams to locate the attending and reach out via [email or web address]. Between 7PM-7AM, please contact night-coverage If you still have difficulty reaching the attending provider, please page the [facility] (Director on Call) for [facility] on amion for assistance.

## 2021-03-05 NOTE — Progress Notes (Addendum)
Physical Therapy Treatment Patient Details Name: Jeremy Browning MRN: [id number] DOB: 06/29/1961 Today's Date: 03/05/2021    History of Present Illness Pt is a 60 yo male that presented from home with respiratory distress 12/25/2020. Underwent defibrillation and CPR, emergently intubated. During admission pt has had a cardiac cath, peg tube and tracheostomy placed, weaned from vent 5/26 AM. 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 Mildly restricted diffusion involving the cortex more diffusely over the right cerebral convexity favored to reflect an acute MCA territory infarct over global hypoxic injury given unilaterality".    PT Comments    Pt seen for PT tx with pt requiring extra time to initiate movement but then eager to participate. Session focused on transfers & gait with pt able to complete sit>stand with RW & mod assist, stand pivot with max assist or HHA mod assist +2. Pt ambulates 20 ft with RW, L hand orthosis and min assist with decreased gait speed, decreased step length & decreased weight shifting to L with pt requiring 2 standing breaks 2/2 fatigue but eager to continue. During session, assisted pt to [facility] for continent BM. Observed pt to have increased tone in LUE with tremulous movements with exertion. Educated pt on need to wear LUE resting hand splint at night & nurse notified as well 2/2 increased tightness in digits of L hand. Will continue to follow pt acutely to progress mobility as able. Continue to recommend CIR level of therapy at d/c as pt would benefit from intensive therapies to maximize independence with mobility & reduce fall risk.  Follow Up Recommendations  CIR     Equipment Recommendations   (TBD in next venue)    Recommendations for Other Services       Precautions / Restrictions Precautions Precautions: Fall Precaution Comments: PEG Required Braces or Orthoses: Other Brace (resting hand splint at  night) Other Brace: L hand orthosis on RW Restrictions Weight Bearing Restrictions: No    Mobility  Bed Mobility Overal bed mobility: Needs Assistance Bed Mobility: Supine to Sit Rolling: Supervision (roll L supervision, mod assist to roll R 2/2 decreased space in bed)   Supine to sit: Supervision;HOB elevated          Transfers Overall transfer level: Needs assistance Equipment used: Rolling walker (2 wheeled);2 person hand held assist Transfers: Sit to/from [employer] Sit to Stand: Mod assist Stand pivot transfers: Mod assist;+2 physical assistance       General transfer comment: Pt completes sit>stand from recliner & BSC with mod assist with RW, stand pivot bed>recliner with max assist +1, stand pivot BSC>recliner with HHA+2. PT provides cuing for safe hand placement to push to standing from RW with poor carryover. Cuing provided for sequencing turning & completely turning to surface.  Ambulation/Gait Ambulation/Gait assistance: Min assist Gait Distance (Feet): 20 Feet Assistive device: Rolling walker (2 wheeled) (L hand orthosis) Gait Pattern/deviations: Decreased step length - right;Decreased step length - left;Decreased dorsiflexion - left;Decreased dorsiflexion - right;Decreased weight shift to left;Decreased stride length Gait velocity: decreased   General Gait Details: 2 standing rest breaks 2/2 fatigue but pt eager to continue ambulating; does report his L hand is giving out   Social research officer, [employer] Rank[employer] (Stroke Patients Only)       Balance Overall balance assessment: Needs assistance Sitting-balance support: No upper extremity supported;Feet supported Sitting balance-Leahy Scale: Good  Standing balance support: Single extremity supported;During functional activity Standing balance-Leahy Scale: Fair Standing balance comment: Pt able to demonstrate static standing with RUE support & CGA<>Close  supervision                            Cognition Arousal/Alertness: Awake/alert Behavior During Therapy: WFL for tasks assessed/performed Overall Cognitive Status: Impaired/Different from baseline Area of Impairment: Orientation;Following commands;Safety/judgement;Attention;[place] level;Memory                     Memory: Decreased short-term memory Following Commands: Follows one step commands consistently Safety/Judgement: Decreased awareness of deficits;Decreased awareness of safety Awareness: Anticipatory;Emergent          Exercises      General Comments General comments (skin integrity, edema, etc.): Pt assisted to [facility] & has continent BM, performs peri hygiene himself with nurse checking afterwards for cleanliness. Pt with difficulty managing secretions & frequently dripping out of L side of mouth, slightly slurred speech at a lower volume. Pt also with incontinent void in brief during session.      Pertinent Vitals/Pain Pain Assessment: Faces Faces Pain Scale: Hurts little more Pain Location: "cramping" in R side of abdomen Pain Descriptors / Indicators: Cramping Pain Intervention(s): Monitored during session;Limited activity within patient's tolerance    Home Living                      Prior Function            PT Goals (current goals can now be found in the care plan section) Acute Rehab PT Goals Patient Stated Goal: get better, work with therapy PT Goal Formulation: With patient Time For Goal Achievement: 03/13/21 Potential to Achieve Goals: Good Additional Goals Additional Goal #1: patient will follow 1 out of 3 of single step commands during one session in prpearation for progression of functional mobility Additional Goal #2: family will be able to perform PROM exercises to maintain pt joint integrity, skin protection, and improve ease of bed mobility of pt Progress towards PT goals: Progressing toward goals    Frequency     Min 5X/week      PT Plan Current plan remains appropriate    Co-evaluation              AM-PAC PT "6 Clicks" Mobility   Outcome Measure  Help needed turning from your back to your side while in a flat bed without using bedrails?: A Little Help needed moving from lying on your back to sitting on the side of a flat bed without using bedrails?: A Little Help needed moving to and from a bed to a chair (including a wheelchair)?: A Lot Help needed standing up from a chair using your arms (e.g., wheelchair or bedside chair)?: A Lot Help needed to walk in hospital room?: A Lot Help needed climbing 3-5 steps with a railing? : Total 6 Click Score: 13    End of Session Equipment Utilized During Treatment: Gait belt (positioned above PEG tube) Activity Tolerance: Patient tolerated treatment well Patient left: in chair;with call bell/phone within reach;with chair alarm set (reivewed use of call bell with pt return demonstrating) Nurse Communication: Mobility status PT Visit Diagnosis: Other abnormalities of gait and mobility (R26.89);Muscle weakness (generalized) (M62.81);Other symptoms and signs involving the nervous system (R29.898);Hemiplegia and hemiparesis Hemiplegia - Right/Left: Left Hemiplegia - dominant/non-dominant: Non-dominant Hemiplegia - caused by: Cerebral infarction     Time: 3428-7681 PT  Time Calculation (min) (ACUTE ONLY): 40 min  Charges:  $Therapeutic Activity: 8-22 mins $Neuromuscular Re-education: 23-37 mins                    Aleda Grana, PT, DPT 03/05/21, 2:54 PM    Sandi Mariscal 03/05/2021, 2:46 PM

## 2021-03-06 ENCOUNTER — Inpatient Hospital Stay: Payer: Medicaid Other

## 2021-03-06 DIAGNOSIS — R9089 Other abnormal findings on diagnostic imaging of central nervous system: Secondary | ICD-10-CM

## 2021-03-06 LAB — RENAL FUNCTION PANEL
Albumin: 3.1 g/dL — ABNORMAL LOW (ref 3.5–5.0)
Anion gap: 6 (ref 5–15)
BUN: 45 mg/dL — ABNORMAL HIGH (ref 8–23)
CO2: 24 mmol/L (ref 22–32)
Calcium: 9.3 mg/dL (ref 8.9–10.3)
Chloride: 106 mmol/L (ref 98–111)
Creatinine, Ser: 1.33 mg/dL — ABNORMAL HIGH (ref 0.61–1.24)
GFR, Estimated: >60 mL/min (ref >60)
Glucose, Bld: 157 mg/dL — ABNORMAL HIGH (ref 70–99)
Phosphorus: 4.3 mg/dL (ref 2.5–4.6)
Potassium: 5.6 mmol/L — ABNORMAL HIGH (ref 3.5–5.1)
Sodium: 136 mmol/L (ref 135–145)

## 2021-03-06 LAB — GLUCOSE, CAPILLARY
Glucose-Capillary: 99 mg/dL (ref 70–99)
Glucose-Capillary: 154 mg/dL — ABNORMAL HIGH (ref 70–99)
Glucose-Capillary: 162 mg/dL — ABNORMAL HIGH (ref 70–99)
Glucose-Capillary: 194 mg/dL — ABNORMAL HIGH (ref 70–99)

## 2021-03-06 IMAGING — MR MR HEAD W/O CM
11 series · 48 of 48 positions shown · non-contrast
Comparison: MRI head [DATE].  CT head [DATE]

CLINICAL DATA: Stroke follow-up.

EXAM:
MRI HEAD WITHOUT CONTRAST
TECHNIQUE: Multiplanar, multiecho pulse sequences of the brain and surrounding
structures were obtained without intravenous contrast.

[Series 5: ax dwi_tracew · axial · 3.0mm · 0.71mm/px · z∈[-110,+52]mm · 5 of 56 slices shown]
[im 1/56]
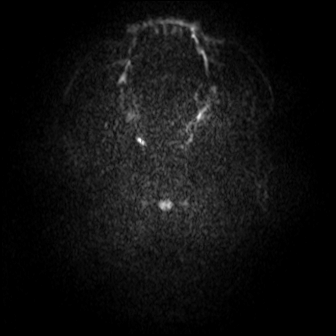
[im 14/56]
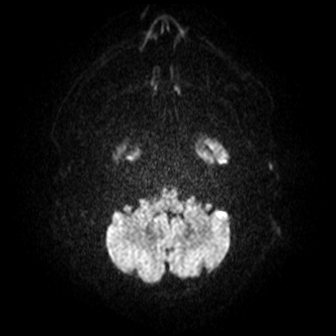
[im 28/56]
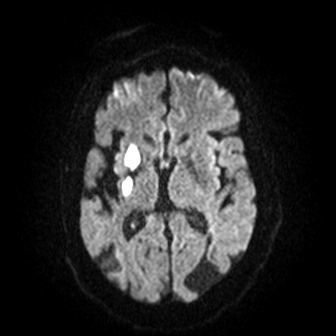
[im 42/56]
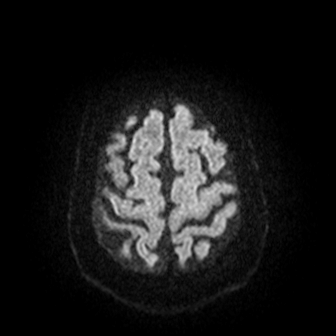
[im 56/56]
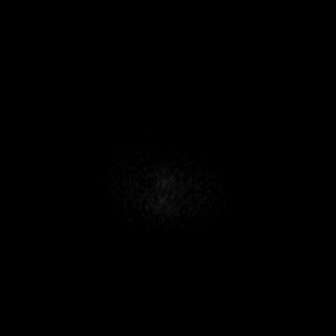

[Series 6: ax dwi_adc · axial · 3.0mm · 0.71mm/px · z∈[-110,+52]mm · 5 of 56 slices shown]
[im 1/56]
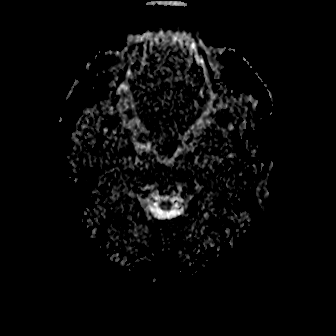
[im 14/56]
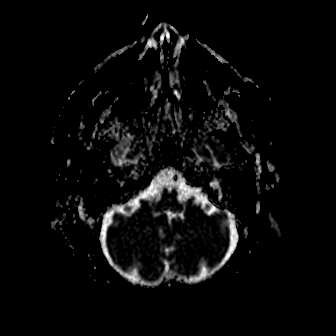
[im 28/56]
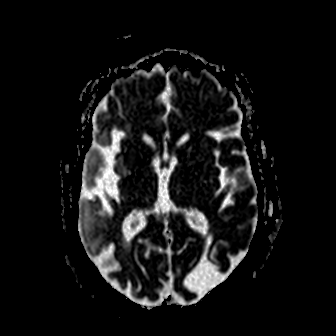
[im 42/56]
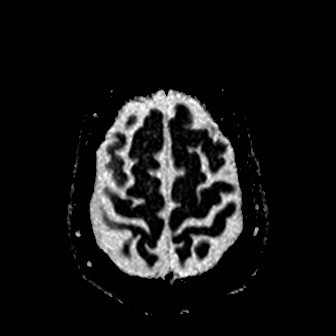
[im 56/56]
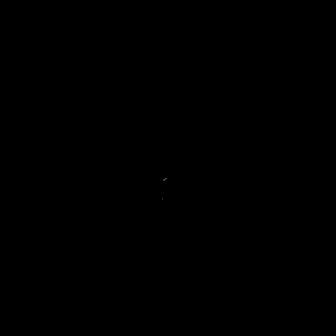

[Series 7: cor dwi_tracew · coronal · 5.0mm · 0.68mm/px · 3 of 40 slices shown]
[im 1/40]
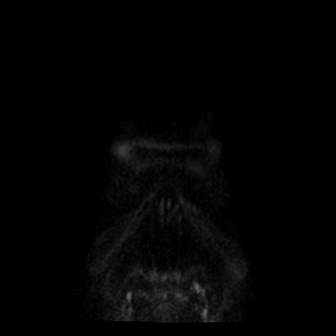
[im 20/40]
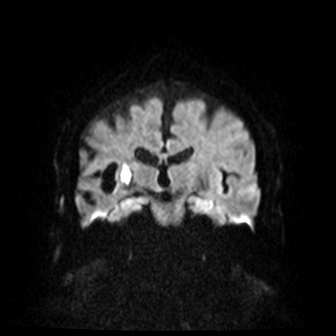
[im 40/40]
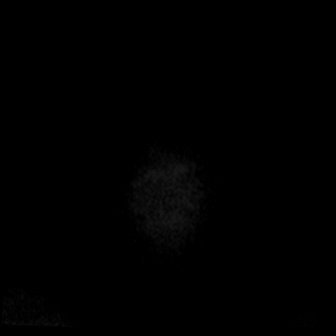

[Series 8: cor dwi_adc · coronal · 5.0mm · 0.68mm/px · 3 of 40 slices shown]
[im 1/40]
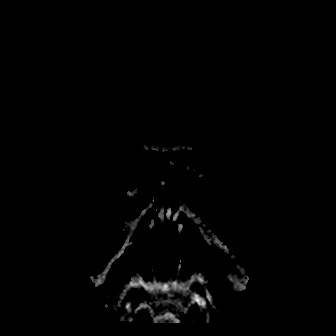
[im 20/40]
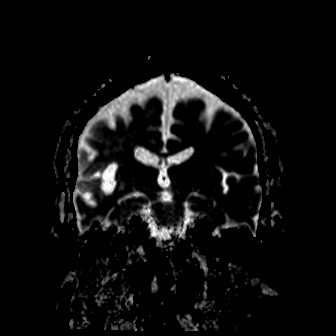
[im 40/40]
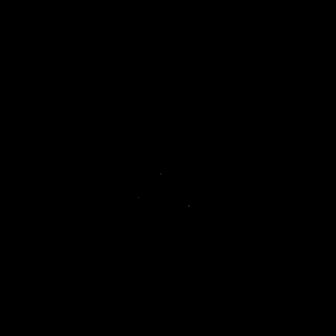

[Series 9: T1 · sagittal · 5.0mm · 0.47mm/px · 2 of 24 slices shown (1 of 2)]
[im 1/24]
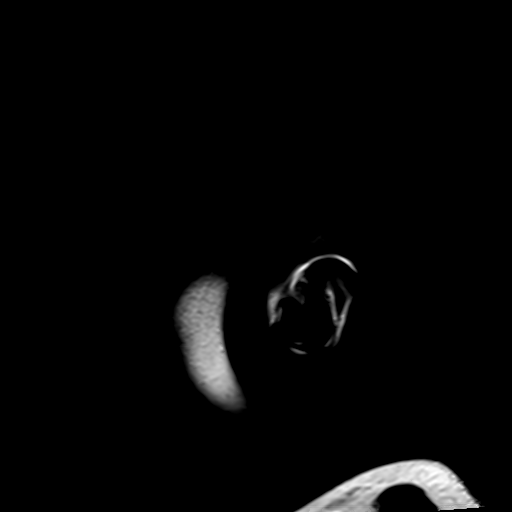
[im 24/24]
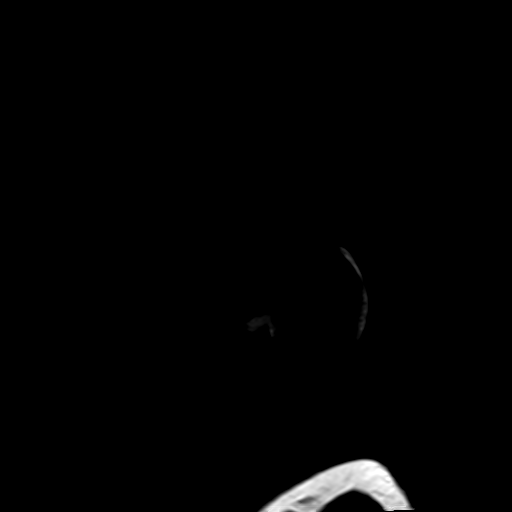

[Series 10: T2 · axial · 5.0mm · 0.86mm/px · z∈[-92,+49]mm · 2 of 25 slices shown (1 of 2)]
[im 1/25]
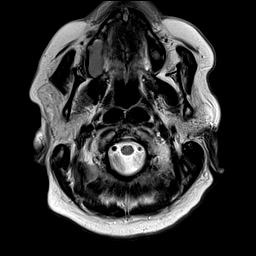
[im 25/25]
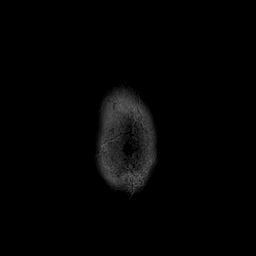

[Series 12: pha_images · axial · 3.0mm · 0.90mm/px · z∈[-97,+50]mm · 4 of 51 slices shown]
[im 1/51]
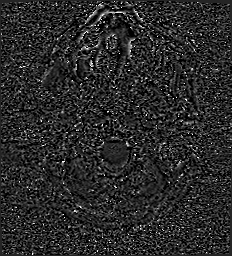
[im 17/51]
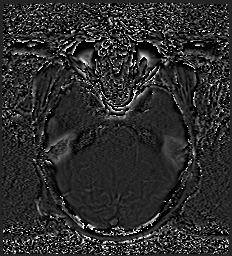
[im 34/51]
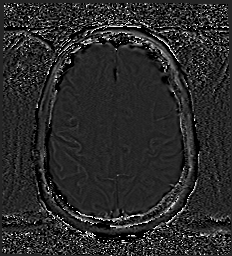
[im 51/51]
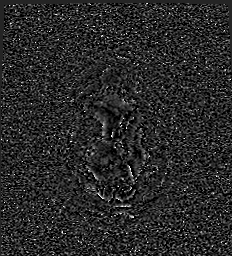

[Series 13: swi_images · axial · 3.0mm · 0.90mm/px · z∈[-97,+53]mm · 4 of 52 slices shown]
[im 1/52]
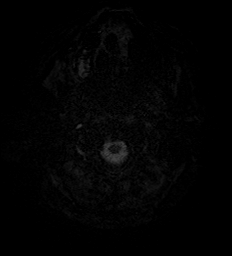
[im 18/52]
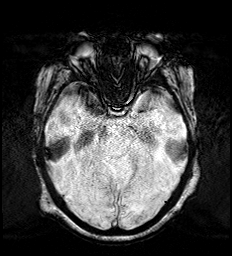
[im 35/52]
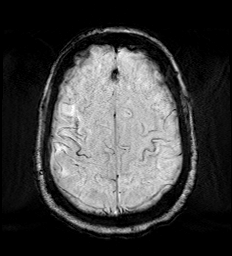
[im 52/52]
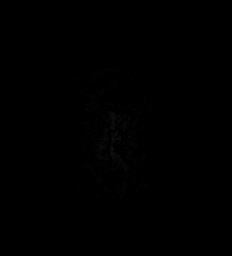

[Series 15: FLAIR · axial · 3.0mm · 0.69mm/px · z∈[-101,+58]mm · 4 of 55 slices shown]
[im 1/55]
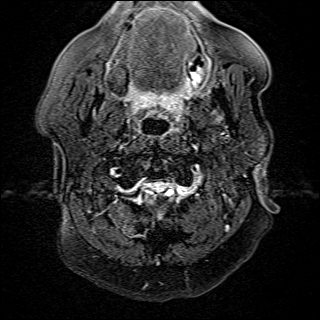
[im 19/55]
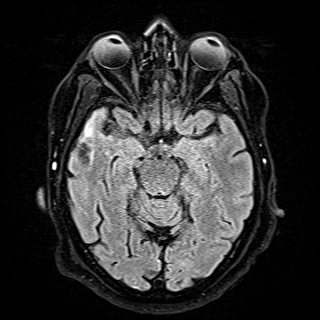
[im 37/55]
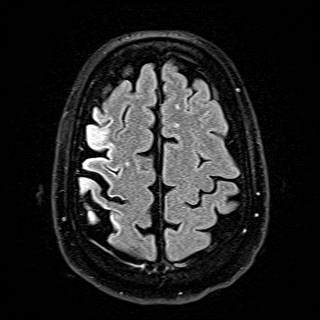
[im 55/55]
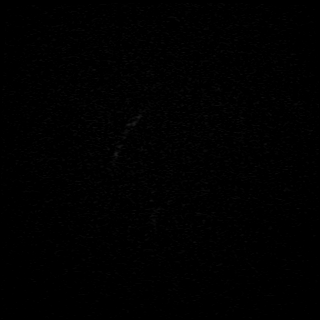

[Series 16: T1 · axial · 1.0mm · 0.98mm/px · z∈[-109,+60]mm · 14 of 171 slices shown (2 of 2)]
[im 1/171]
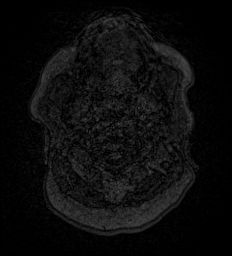
[im 14/171]
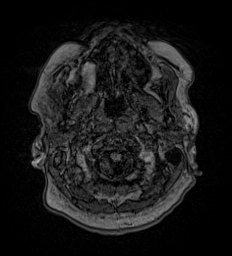
[im 27/171]
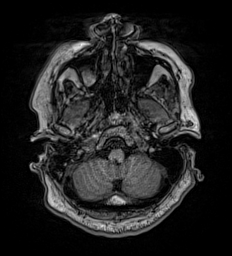
[im 40/171]
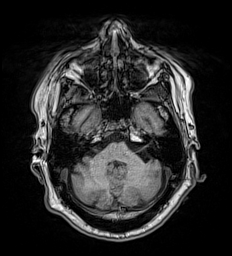
[im 53/171]
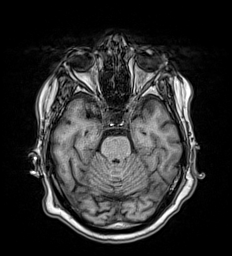
[im 66/171]
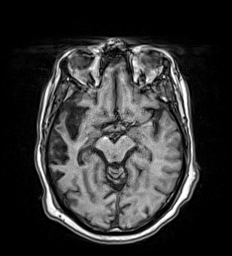
[im 79/171]
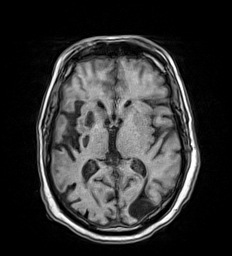
[im 92/171]
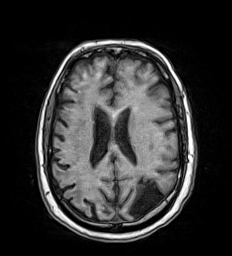
[im 105/171]
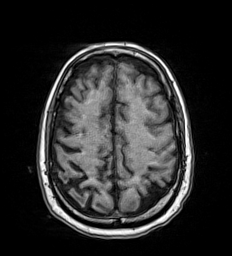
[im 118/171]
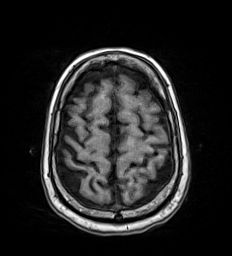
[im 131/171]
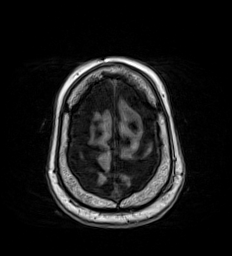
[im 144/171]
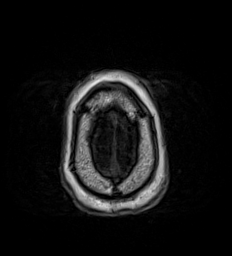
[im 157/171]
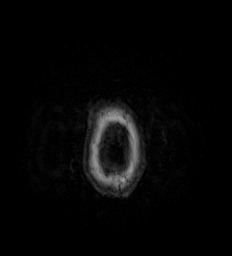
[im 171/171]
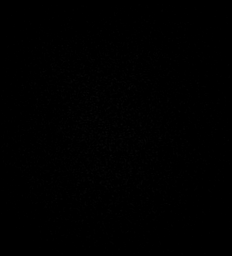

[Series 17: T2 · coronal · 5.0mm · 0.45mm/px · 2 of 31 slices shown (2 of 2)]
[im 1/31]
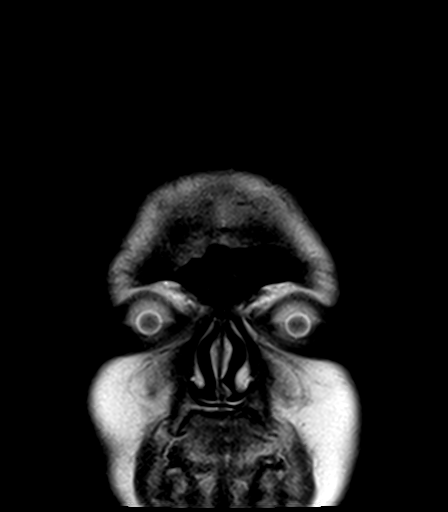
[im 31/31]
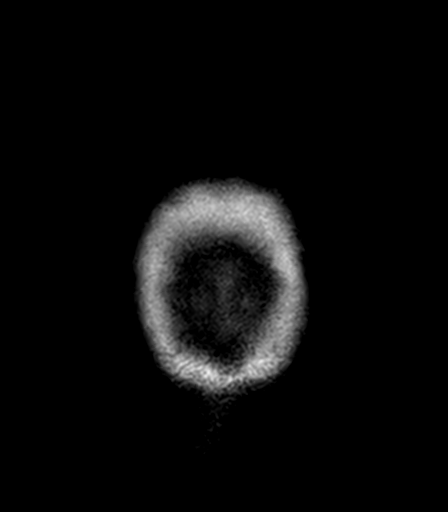

[48 of 48 positions shown; findings below may reference images not displayed]

FINDINGS: Brain: Interval development of cortical hyperintensity and cortical
volume loss in the right MCA territory. Findings compatible with
right MCA infarct which has occurred since the prior studies. Mild
associated hemorrhage is present in the infarct cortex. In addition,
there is some hemosiderin staining over the convexity due to
subarachnoid hemorrhage. No residual restricted diffusion in this
area.

There is hematoma in the right basal ganglia likely in the external
capsule which was not present previously. Moderate amount of blood
is present. This was not present on the prior CT or MRI.

Ventricle size normal. Chronic infarct left occipital parietal lobe.

Vascular: Normal arterial flow voids at the skull base. There is
attenuated flow void in the right M1 and M2 branches which may be
occluded.

Skull and upper cervical spine: No focal skeletal lesion.

Sinuses/Orbits: Retention cyst right maxillary sinus. Negative orbit

Other: None
IMPRESSION: Interval development of large territory right MCA infarct without
restricted diffusion. This is most likely a late subacute infarct.
There is probable high-grade stenosis or occlusion of the right
middle cerebral artery.

Interval development of hemorrhage in the right external capsule
which likely is also subacute.

These results were called by telephone at the time of interpretation
on [DATE] at [DATE] to provider RONEY , who verbally
acknowledged these results.

## 2021-03-06 IMAGING — CT CT ANGIO HEAD-NECK (W OR W/O PERF)
2 of 7 series · 8 of 33 positions shown · IV contrast (APPLIED)
Comparison: MRI head [DATE].  CT head [DATE]

CLINICAL DATA: Stroke.

EXAM:
CT ANGIOGRAPHY HEAD AND NECK
TECHNIQUE: Multidetector CT imaging of the head and neck was performed using
the standard protocol during bolus administration of intravenous
contrast. Multiplanar CT image reconstructions and MIPs were
obtained to evaluate the vascular anatomy. Carotid stenosis
measurements (when applicable) are obtained utilizing NASCET
criteria, using the distal internal carotid diameter as the
denominator.
CONTRAST:  75mL OMNIPAQUE IOHEXOL 350 MG/ML SOLN

[Series 4: cta head neck · axial · 0.46mm/px · z∈[-238,-128]mm · 2 of 165 slices shown]
[im 55/165  soft-tissue]
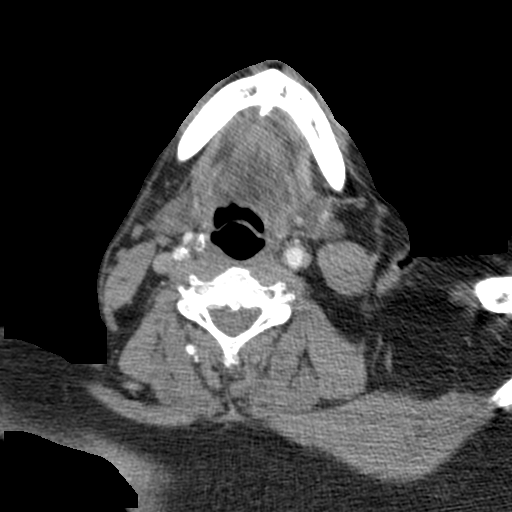
[im 110/165  soft-tissue]
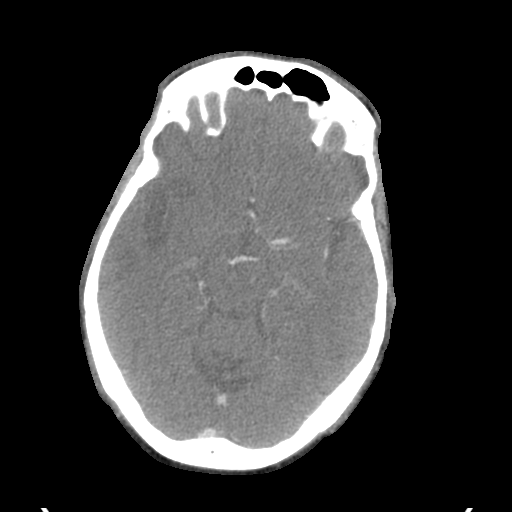

[Series 6: ax thin · axial · 0.39mm/px · z∈[-301,-72]mm · 6 of 321 slices shown]
[im 46/321  soft-tissue]
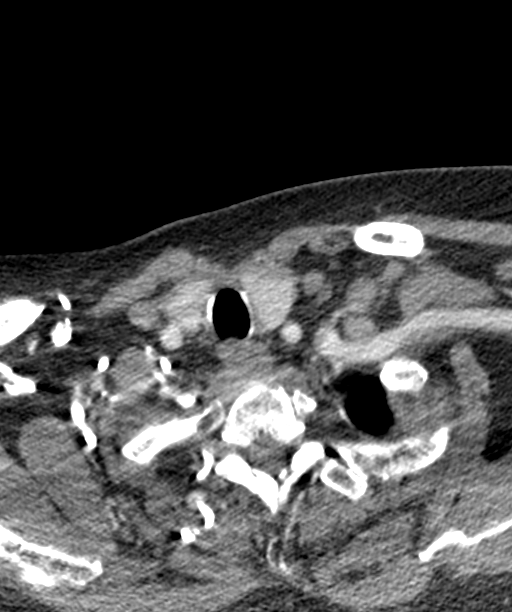
[im 92/321  bone]
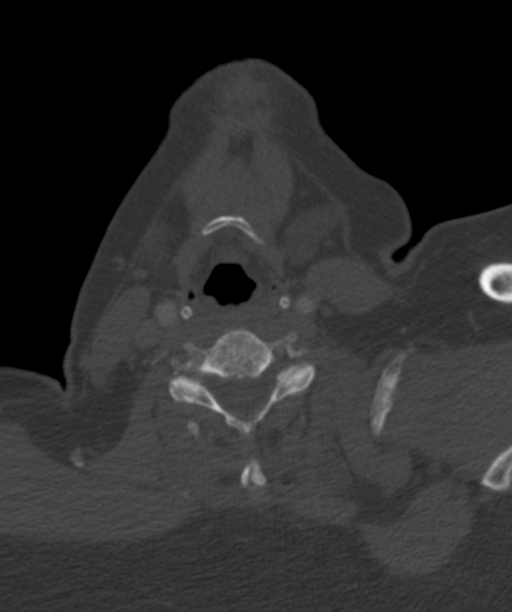
[im 138/321  soft-tissue]
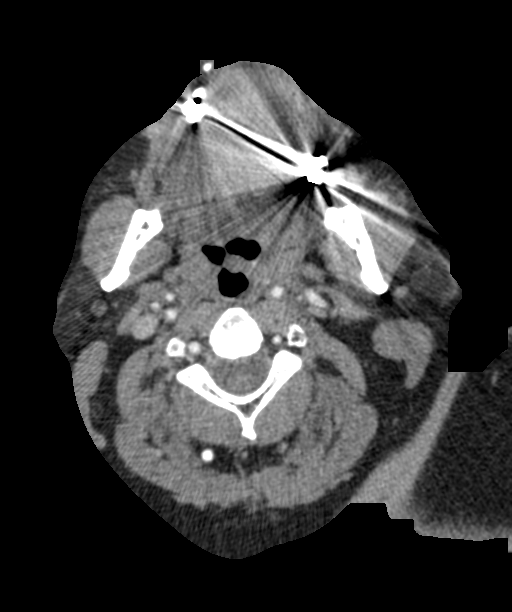
[im 183/321  bone]
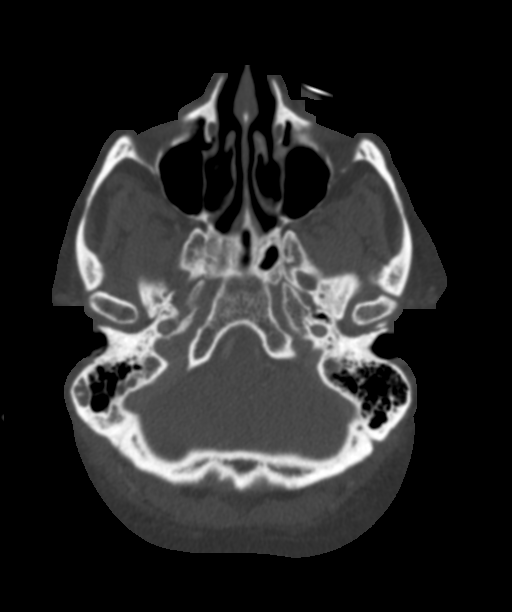
[im 229/321  soft-tissue]
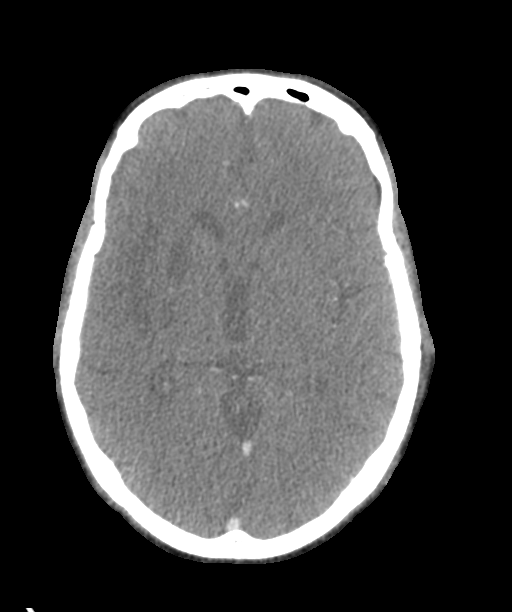
[im 275/321  bone]
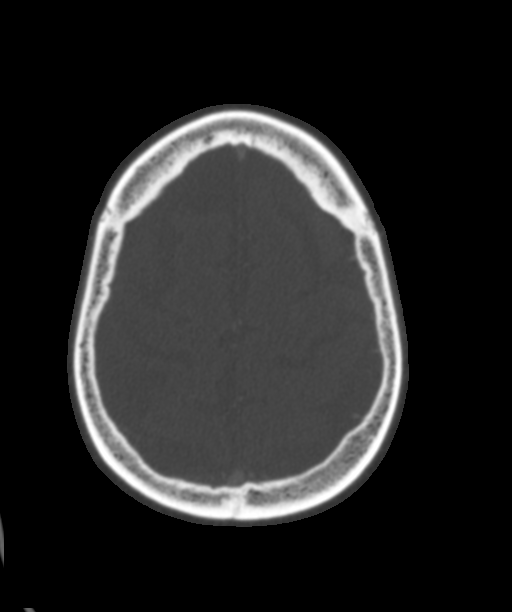

[8 of 33 positions shown; findings below may reference images not displayed]

FINDINGS: CTA NECK FINDINGS

Aortic arch: Aortic arch not covered on the study. Proximal great
vessels patent.

There is suboptimal arterial opacification on the study.

Right carotid system: Atherosclerotic calcification right carotid
bifurcation. Approximately 25% diameter stenosis proximal right
internal carotid artery.

Left carotid system: Atherosclerotic calcification left carotid
bifurcation. Estimated 50% diameter stenosis proximal left internal
carotid artery

Vertebral arteries: Both vertebral arteries patent to the basilar.
There is mild stenosis left vertebral artery at the skull base.

Skeleton: No acute abnormality.

Other neck: Negative for mass or adenopathy in the neck.

Upper chest: Lung apices clear bilaterally.

Review of the MIP images confirms the above findings

CTA HEAD FINDINGS

Anterior circulation: Atherosclerotic disease and at least moderate
stenosis in the cavernous carotid bilaterally. There is suboptimal
intracranial arterial opacification. There is occlusion or severe
stenosis right M1 segment. There is a small vessel which may be a
branch vessel or severely stenotic right M1 segment. There is poor
perfusion of the right MCA branches.

Both anterior cerebral arteries are patent. There is disease in
distal branches of the anterior cerebral artery bilaterally. There
is diffuse disease throughout the left middle cerebral artery
branches without large vessel occlusion.

Posterior circulation: Both vertebral arteries patent to the
basilar. Mild stenosis left vertebral artery at the skull base.
Basilar patent. Diffusely disease posterior cerebral arteries
without occlusion.

Venous sinuses: Normal venous enhancement

Anatomic variants: None

Hypodensity in the right external capsule which showed blood
products on CT MRI. This is most likely late subacute blood.
Hypodensity in the right frontal parietal lobe and insula compatible
with subacute infarct as noted on MRI.

Review of the MIP images confirms the above findings
IMPRESSION: 1. Suboptimal CT angiogram. Aortic arch not included. There is
suboptimal arterial opacification.
2. Atherosclerotic calcification of the carotid bifurcation
bilaterally. Estimated 25% diameter stenosis on the right and 50%
diameter stenosis on the left.
3. Occlusion versus severe stenosis right M1 segment. There is a
small vessel in this area which could be a branch vessel or severely
stenotic right M1 segment. There is poor perfusion of right MCA
branches consistent with MCA infarct.
4. Diffuse intracranial atherosclerotic disease.

## 2021-03-06 MED ORDER — IOHEXOL 350 MG/ML SOLN
75.0000 mL | Freq: Once | INTRAVENOUS | Status: AC | PRN
  Administered 2021-03-06: 75 mL via INTRAVENOUS

## 2021-03-06 MED ORDER — ASPIRIN EC 81 MG PO TBEC
81.0000 mg | DELAYED_RELEASE_TABLET | Freq: Every day | ORAL | Status: DC
  Administered 2021-03-06 – 2021-03-08 (×3): 81 mg via ORAL
  Filled 2021-03-06 (×3): qty 1

## 2021-03-06 MED ORDER — LOSARTAN POTASSIUM 50 MG PO TABS: 100.0000 mg | ORAL_TABLET | Freq: Every day | ORAL | Status: DC

## 2021-03-06 MED ORDER — SODIUM ZIRCONIUM CYCLOSILICATE 10 G PO PACK
10.0000 g | PACK | Freq: Once | ORAL | Status: AC
  Administered 2021-03-07: 10 g via ORAL
  Filled 2021-03-06 (×2): qty 1

## 2021-03-06 MED ORDER — ATORVASTATIN CALCIUM 20 MG PO TABS
20.0000 mg | ORAL_TABLET | Freq: Every day | ORAL | Status: DC
  Administered 2021-03-06: 20 mg via ORAL
  Filled 2021-03-06: qty 1

## 2021-03-06 MED ORDER — ATORVASTATIN CALCIUM 20 MG PO TABS
80.0000 mg | ORAL_TABLET | Freq: Every day | ORAL | Status: DC
  Administered 2021-03-07 – 2021-03-08 (×2): 80 mg via ORAL
  Filled 2021-03-06 (×2): qty 4

## 2021-03-06 MED ORDER — STROKE: EARLY STAGES OF RECOVERY BOOK: Freq: Once | Status: AC

## 2021-03-06 NOTE — Progress Notes (Signed)
[facility] Progress Note  Patient: Jeremy Browning    JZP:[id number]  DOA: 12/25/2020     Date of Service: the patient was seen and examined on 03/06/2021  Brief hospital course: Jeremy Browning is an 60 y.o. male with obesity, DM2, HTN, combined CHF who was admitted on 12/25/2020 with respiratory distress. He was intubated in the ER had VT requiring epi/cardioversion. Hospital course complicated by persistent vent dependent respiratory failure eventually requiring tracheostomy, anoxic encephalopathy. Patient had coronary angiogram performed 5/7, normal coronary arteries. Echocardiogram showed ejection fraction 45 to 50% with grade 1 diastolic dysfunction. Patient since has improved. [name] is removed.  Patient is able to tolerate oral diet, not receiving tube feeding.  5/7 severe resp failure 5/9 multiorgan failure MRI shows brain injury 5/12 failed weaning trials due to severe brain damage 5/17 no purposeful movement on wake up assessment, becomes tachypneic, asynchronous with vent 5/18 remains critically ill, severe brain damage, patient made DNR 5/20 family decided on TRACH/PEG tube 5/25 [facility] PLACED by ENT 5/26 s/p trach PEG tube 6/2 tolerates Trach collar @ 28% FiO2 6/7: declined for LTAC.  Patient is becoming little more responsive 6/12 mental status better, starting to talk with valve 6/23 d/w ENT rec decanulation and contacted RT and  RT decannulated pt to RA 7/13 tube feeds discontinued.  Tolerating oral diet.  If continues to tolerate adequate calories, remove PEG tube August 1 week. 7/17 neuro reconsulted.  Repeat work-up initiated.  We will follow recommendation  Currently plan is arrange for safe discharge plan.  Subjective: No acute complaint.  No nausea or vomiting.  No fever no chills.  Assessment and Plan: Acute hypoxic respiratory failure present on admission. Out-of-hospital respiratory arrest Cardiac arrest-V. tach Acute systolic and diastolic CHF with flash pulm  edema Proteus pneumonia EMS found patient with saturation in 70s on room air at home placed on CPAP. On arrival to ED patient was found unresponsive and pulseless treated while in the ICU, completed a course of antibiotics with ceftriaxone and Unasyn. Require tracheostomy, removed on 6/23. Currently on room air.   Cardiac arrest  V. Tach  non-STEMI, concern for STEMI on admission acute combined systolic and diastolic CHF  cardiogenic shock Requiring pressors while in the ICU, currently resolved. Cardiology consulted and underwent a cardiac cath on 5/7 which showed normal coronaries. 2D echo done on 5/9 showed an EF of 45-50% with global hypokinesis of the left ventricle.   It also showed grade 1 diastolic dysfunction.  Appears euvolemic Currently on carvedilol, losartan.  Will discuss with cardiology regarding transition to [facility]. Was on hydralazine and started on Norvasc. No arrhythmia Repeat echocardiogram ordered per neurology.  Anoxic brain injury. Acute CVA Mentation improving. Still has some confusion. MRI brain shows small acute infarct in right cerebellum and right cerebral hemisphere. Neurology consulted. 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 EEG shows no evidence of acute seizure. Neurology consult on 7/17, CTA pending.  MRI brain show right MCA infarct.  Aspirin Lipitor initiated.  Further stroke work-up per neurology.  Acute kidney injury Hyperkalemia Hypernatremia Renal function on admission was two-point something.  Currently improved to normal. Potassium level was also elevated on admission.  Currently normal. Sodium level worsen while in the hospital and resolved with treatment. Currently electrolytes and renal function normal.  Type 2 diabetes mellitus uncontrolled with hyperglycemia. Hemoglobin A1c 7.1. Currently sliding scale insulin.  Sick euthyroid syndrome. TSH was 8.2 on admission. Free T4 was 0.6. Will recheck.    Elevated LFTs  Noticed on  6/11,  now resolved. Hepatitis panel negative,  right upper quadrant ultrasound negative.   Anemia of chronic disease -hemoglobin stable, no bleeding   Nutrition Patient was on PEG tube feeding. Now mentation improving therefore tolerating p.o. with adequate caloric intake. Tube feeds have been discontinued. On dysphagia 2 diet. Speech therapy following.  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 Recommends steroids for now with a taper.  Mild hypokalemia.  Will provide Lokelma  Scheduled Meds:  vitamin C  250 mg Oral BID   aspirin EC  81 mg Oral Daily   [START ON 03/07/2021] atorvastatin  80 mg Oral Daily   carvedilol  6.25 mg Oral BID WC   diclofenac Sodium  2 g Topical QID   docusate sodium  100 mg Oral BID   enoxaparin (LOVENOX) injection  0.5 mg/kg Subcutaneous Q24H   famotidine  20 mg Oral Daily   feeding supplement (NEPRO CARB STEADY)  237 mL Oral TID BM   free water  30 mL Per Tube Q4H   insulin aspart  0-15 Units Subcutaneous TID WC   insulin aspart  0-5 Units Subcutaneous QHS   losartan  100 mg Oral Daily   mouth rinse  15 mL Mouth Rinse BID   multivitamin with minerals  1 tablet Per Tube Daily   neomycin-bacitracin-polymyxin   Topical BID   polyethylene glycol  17 g Oral Daily   [START ON 03/07/2021] predniSONE  20 mg Oral Q breakfast   Followed by   Derrill Memo ON 03/12/2021] predniSONE  10 mg Oral Q breakfast   Followed by   Derrill Memo ON 03/17/2021] predniSONE  5 mg Oral Q breakfast   Continuous Infusions: PRN Meds: acetaminophen (TYLENOL) oral liquid 160 mg/5 mL, albuterol, hydrALAZINE, lip balm, oxyCODONE, polyvinyl alcohol  Body mass index is 33.37 kg/m.  Nutrition Problem: Inadequate oral intake Etiology: inability to eat (pt sedated and ventilated) Pressure Injury Buttocks Left (Active)     Location: Buttocks  Location Orientation: Left  Staging:    Wound Description (Comments):   Present on Admission:      Pressure Injury 02/09/21 Buttocks Medial Stage 2 -  Partial thickness loss of dermis presenting as a shallow open injury with a red, pink wound bed without slough. (Active)  02/09/21 1209  Location: Buttocks  Location Orientation: Medial  Staging: Stage 2 -  Partial thickness loss of dermis presenting as a shallow open injury with a red, pink wound bed without slough.  Wound Description (Comments):   Present on Admission:      Pressure Injury 02/09/21 Buttocks Left Stage 2 -  Partial thickness loss of dermis presenting as a shallow open injury with a red, pink wound bed without slough. (Active)  02/09/21 1213  Location: Buttocks  Location Orientation: Left  Staging: Stage 2 -  Partial thickness loss of dermis presenting as a shallow open injury with a red, pink wound bed without slough.  Wound Description (Comments):   Present on Admission:     DVT Prophylaxis: Lovenox  Advance goals of care discussion: Pt is DNR.  Family Communication: no family was present at bedside, at the time of interview.   Physical Exam:  General: Appear in mild distress, no Rash; Oral Mucosa Clear, moist. no Abnormal Neck Mass Or lumps, Conjunctiva normal  Cardiovascular: S1 and S2 Present, no Murmur, Respiratory: good respiratory effort, Bilateral Air entry present and CTA, no Crackles,  no wheezes Abdomen: Bowel Sound present, Soft and no tenderness Extremities: no Pedal edema Neurology: alert and oriented to time, place, and person affect appropriate. no new focal deficit Gait not checked due to patient safety concerns  Vitals:   03/05/21 1922 03/06/21 0435 03/06/21 0800 03/06/21 1618  BP: (!) 106/38 (!) 144/44 (!) 129/50 (!) 137/57  Pulse: 60 (!) 54 100 (!) 57  Resp: '20 20 20 18  ' Temp: 98.6 F (37 C) 98.5 F (36.9 C) 97.8 F (36.6 C) 99.3 F (37.4 C)  TempSrc: Oral Oral Oral   SpO2: 100% 99% 99% 100%  Weight:      Height:        Disposition:  Status is: Inpatient  Remains inpatient appropriate because:Unsafe d/c plan  Dispo: The patient is from: Home              Anticipated d/c is to: to be determined               Patient currently is medically stable to d/c.   Difficult to place patient Yes  Time spent: 35 minutes. I reviewed all nursing notes, pharmacy notes, vitals, pertinent old records. I have discussed plan of care as described above with RN.  Author: Berle Mull, MD [facility] 03/06/2021 6:22 PM  To reach On-call, see care teams to locate the attending and reach out via [email or web address]. Between 7PM-7AM, please contact night-coverage If you still have difficulty reaching the attending provider, please page the [facility] (Director on Call) for [facility] on amion for assistance.

## 2021-03-06 NOTE — Progress Notes (Signed)
Neurology Progress Note   S:// 60 year old man with history obesity, diabetes, hypertension, combined systolic and diastolic heart failure presented to the ED on Dec 25, 2020 respiratory distress complicated by NSTEMI and subsequently went into cardiac arrest in the ED and intubated.  Neurology initially consulted for prognostication after cardiac arrest. Since then, after a prolonged course in total 71 days of hospitalization, he is at a point where his tracheostomy has been removed, he is on the way for the PEG tube removal as well. Work-up in the form of the MRI during prognostication showed small acute infarcts in the right cerebellum and right cerebral hemisphere along with mild restricted diffusion involving the cortex mainly on the right side favored to reflect an acute MCA infarct over global anoxic given injury given unilaterality.  Also seen chronic left right occipital and right basal ganglia infarcts from which she has been afflicted for a while and has left upper extremity spastic paresis.  Neurology was reconsulted because of recommendations on completion of work-up   O:// Current vital signs: BP (!) 129/50 (BP Location: Right Arm)   Pulse 100   Temp 97.8 F (36.6 C) (Oral)   Resp 20   Ht 6' (1.829 m)   Wt 111.6 kg   SpO2 99%   BMI 33.37 kg/m  Vital signs in last 24 hours: Temp:  [97.8 F (36.6 C)-98.6 F (37 C)] 97.8 F (36.6 C) (07/17 0800) Pulse Rate:  [54-100] 100 (07/17 0800) Resp:  [18-20] 20 (07/17 0800) BP: (106-150)/(38-60) 129/50 (07/17 0800) SpO2:  [99 %-100 %] 99 % (07/17 0800) General: Awake alert in no distress HEENT: Normocephalic/atraumatic Lungs: Clear Cardiovascular: Regular rhythm Abdomen nondistended nontender Extremities warm well perfused Neurological exam Mental status, awake alert oriented to self and the fact that he is in the hospital. Could not tell me the correct month. Speech is slow he is a bradyphrenic Attention concentration is  reduced but he is able to follow simple commands. There is mild dysarthria No evidence of aphasia Cranial nerves: Pupils equal round react light, extraocular movements appear intact, visual fields appear full, facial symmetry is distorted-mild left nasolabial fold flattening and left lower face weakness, tongue and palate midline. Motor examination: He can raise all 4 extremities antigravity without much drift but the left upper extremity has increased tone and the left hand is in a hand splint for spasticity. Sensation intact to light touch. Coordination with no obvious dysmetria  Medications  Current Facility-Administered Medications:    acetaminophen (TYLENOL) 160 MG/5ML solution 650 mg, 650 mg, Oral, Q6H PRN, Darlin Priestly, MD   albuterol (PROVENTIL) (2.5 MG/3ML) 0.083% nebulizer solution 2.5 mg, 2.5 mg, Nebulization, Q4H PRN, Rust-Chester, Britton L, NP, 2.5 mg at 01/14/21 1935   ascorbic acid (VITAMIN C) tablet 250 mg, 250 mg, Oral, BID, Rolly Salter, MD, 250 mg at 03/06/21 0945   aspirin EC tablet 81 mg, 81 mg, Oral, Daily, Rolly Salter, MD, 81 mg at 03/06/21 0946   atorvastatin (LIPITOR) tablet 20 mg, 20 mg, Oral, Daily, Rolly Salter, MD, 20 mg at 03/06/21 0946   carvedilol (COREG) tablet 6.25 mg, 6.25 mg, Oral, BID WC, Rolly Salter, MD, 6.25 mg at 03/06/21 0945   diclofenac Sodium (VOLTAREN) 1 % topical gel 2 g, 2 g, Topical, QID, Vann, Jessica U, DO, 2 g at 03/06/21 0947   docusate sodium (COLACE) capsule 100 mg, 100 mg, Oral, BID, Darlin Priestly, MD, 100 mg at 03/06/21 0945   enoxaparin (LOVENOX) injection 52.5  mg, 0.5 mg/kg, Subcutaneous, Q24H, Ronnald Ramp, RPH, 52.5 mg at 03/06/21 0947   famotidine (PEPCID) tablet 20 mg, 20 mg, Oral, Daily, Darlin Priestly, MD, 20 mg at 03/06/21 0945   feeding supplement (NEPRO CARB STEADY) liquid 237 mL, 237 mL, Oral, TID BM, Darlin Priestly, MD, Last Rate: 0 mL/hr at 03/02/21 0731, 237 mL at 03/06/21 0946   free water 30 mL, 30 mL, Per Tube, Q4H, Darlin Priestly, MD, 30 mL at 03/06/21 1245   hydrALAZINE (APRESOLINE) injection 10 mg, 10 mg, Intravenous, Q4H PRN, Antonieta Iba, MD, 10 mg at 01/21/21 0620   insulin aspart (novoLOG) injection 0-15 Units, 0-15 Units, Subcutaneous, TID WC, Darlin Priestly, MD, 3 Units at 03/06/21 1244   insulin aspart (novoLOG) injection 0-5 Units, 0-5 Units, Subcutaneous, QHS, Darlin Priestly, MD, 2 Units at 03/03/21 2221   lip balm (BLISTEX) ointment, , Topical, PRN, Joseph Art, DO   losartan (COZAAR) tablet 100 mg, 100 mg, Oral, Daily, Marrion Coy, MD, 100 mg at 03/06/21 0946   MEDLINE mouth rinse, 15 mL, Mouth Rinse, BID, Marrion Coy, MD, 15 mL at 03/05/21 3557   multivitamin with minerals tablet 1 tablet, 1 tablet, Per Tube, Daily, Darlin Priestly, MD, 1 tablet at 03/06/21 0945   neomycin-bacitracin-polymyxin (NEOSPORIN) ointment, , Topical, BID, Gillis Santa, MD, Given at 03/06/21 [id number]   oxyCODONE (Oxy IR/ROXICODONE) immediate release tablet 5 mg, 5 mg, Oral, Q6H PRN, Darlin Priestly, MD   polyethylene glycol (MIRALAX / GLYCOLAX) packet 17 g, 17 g, Oral, Daily, Rolly Salter, MD, 17 g at 03/05/21 2542   polyvinyl alcohol (LIQUIFILM TEARS) 1.4 % ophthalmic solution 2 drop, 2 drop, Both Eyes, PRN, Arnetha Courser, MD, 2 drop at 01/25/21 2210   [COMPLETED] predniSONE (DELTASONE) tablet 30 mg, 30 mg, Oral, Q breakfast, 30 mg at 03/06/21 0946 **FOLLOWED BY** [START ON 03/07/2021] predniSONE (DELTASONE) tablet 20 mg, 20 mg, Oral, Q breakfast **FOLLOWED BY** [START ON 03/12/2021] predniSONE (DELTASONE) tablet 10 mg, 10 mg, Oral, Q breakfast **FOLLOWED BY** [START ON 03/17/2021] predniSONE (DELTASONE) tablet 5 mg, 5 mg, Oral, Q breakfast, Darlin Priestly, MD  Labs CBC    Component Value Date/Time   WBC 13.2 (H) 03/03/2021 0433   RBC 4.32 03/03/2021 0433   HGB 10.8 (L) 03/03/2021 0433   HCT 35.5 (L) 03/03/2021 0433   PLT 167 03/03/2021 0433   MCV 82.2 03/03/2021 0433   MCH 25.0 (L) 03/03/2021 0433   MCHC 30.4 03/03/2021 0433   RDW 17.6 (H)  03/03/2021 0433   LYMPHSABS 5.3 (H) 02/09/2021 0430   MONOABS 1.6 (H) 02/09/2021 0430   EOSABS 0.3 02/09/2021 0430   BASOSABS 0.0 02/09/2021 0430   CMP     Component Value Date/Time   NA 136 02/18/2021 0458   K 4.2 02/18/2021 0458   CL 105 02/18/2021 0458   CO2 25 02/18/2021 0458   GLUCOSE 118 (H) 02/18/2021 0458   BUN 39 (H) 02/18/2021 0458   CREATININE 1.22 03/01/2021 0629   CALCIUM 8.7 (L) 02/18/2021 0458   PROT 7.6 02/14/2021 0644   ALBUMIN 2.8 (L) 02/14/2021 0644   AST 85 (H) 02/14/2021 0644   ALT 227 (H) 02/14/2021 0644   ALKPHOS 88 02/14/2021 0644   BILITOT 0.4 02/14/2021 0644   GFRNONAA >60 03/01/2021 0629   Lipid Panel     Component Value Date/Time   CHOL 149 12/26/2020 0420   TRIG 138 01/12/2021 0401   HDL 36 (L) 12/26/2020 0420   CHOLHDL 4.1 12/26/2020 0420  VLDL 18 12/26/2020 0420   LDLCALC 95 12/26/2020 0420   LDL 95 A1c 7.1 on 02/26/2021.   Imaging I have reviewed images in epic and the results pertinent to this consultation are: MRI of the brain 12/27/2020 showed Small acute infarcts in the right cerebellum and right cerebral hemisphere-I would imagine likely related to cardiac arrest as well as restriction in the cortex predominantly on the right hemisphere concerning for anoxic injury versus stroke but given the laterality, more likely due to hypoperfusion.     2D echo From 12/27/2020 with LVEF of 45 to 50% with global hypokinesis of the left ventricle, the left ventricular internal cavity size was mildly to moderately dilated and mild left ventricular hypertrophy was seen.  Right ventricular function normal.  Left atrial size is mildly dilated.  Assessment:  60 year old man, past history of obesity diabetes hypertension, and heart failure presented in respiratory distress complicated by NSTEMI and subsequent cardiac arrest on Dec 25, 2020, initially evaluated by the neurology service for prognostication which seemed poor but he has made recovery to the  point where he is conversational at this time. His imaging at the time right after the cardiac arrest that showed scattered right hemispheric infarcts, right cerebellar infarcts and restricted diffusion cortically in  the right hemisphere-concerning for stroke given the laterality. Seizure edema could also have been responsible for unilateral cortical findings. Other differentials should include anoxic brain injury, very generalized and severe global. Given all the imaging findings on the right side, I would pursue a full stroke work-up to evaluate for underlying carotid stenosis, making it symptomatic in the setting of cardiac arrest.  Impression: Hypoxic ischemic encephalopathy-resolved Ischemic strokes and embolic event-likely related to the cardiac arrest but require further work-up Evaluate for carotid stenosis versus cardioembolic causes  Recommendations: Repeat 2D echocardiogram Repeat MRI brain without contrast CT angiography head and neck Continue PT OT Continue supportive care per primary team and continue medical management per primary team.   From a stroke prevention standpoint, continue aspirin 81 for now. His LDL is 95, goal LDL should be less than 70.  I would do high intensity statin.  Neurology will follow with you -- Milon Dikes, MD Neurologist [facility] Pager: [phone]

## 2021-03-06 NOTE — TOC Progression Note (Signed)
Transition of Care [facility]) - Progression Note    Patient Details  Name: Jeremy Browning MRN: [id number] Date of Birth: 10/01/1960  [facility]) CM/SW Contact  Liliana Cline, LCSW Phone Number: 03/06/2021, 11:47 AM  Clinical Narrative:   Patient still has no bed offers as of this weekend. Patient is on TOC DTP list. Will continue to follow.    Expected Discharge Plan: Skilled Nursing Facility Barriers to Discharge: Continued Medical Work up  Expected Discharge Plan and Services Expected Discharge Plan: Skilled Nursing Facility In-house Referral: Clinical Social Work   Post Acute Care Choice: Skilled Nursing Facility                                         Social Determinants of Health (SDOH) Interventions    Readmission Risk Interventions No flowsheet data found.

## 2021-03-07 ENCOUNTER — Inpatient Hospital Stay
Admit: 2021-03-07 | Discharge: 2021-03-07 | Disposition: A | Discharge status: Home / Self Care | Payer: Medicaid Other | Attending: Student in an Organized Health Care Education/Training Program | Admitting: Student in an Organized Health Care Education/Training Program

## 2021-03-07 ENCOUNTER — Inpatient Hospital Stay: Payer: Medicaid Other

## 2021-03-07 ENCOUNTER — Encounter: Payer: Self-pay | Admitting: Pulmonary Disease

## 2021-03-07 DIAGNOSIS — I639 Cerebral infarction, unspecified: Secondary | ICD-10-CM

## 2021-03-07 HISTORY — PX: TRANSTHORACIC ECHOCARDIOGRAM: SHX275

## 2021-03-07 LAB — ECHOCARDIOGRAM COMPLETE
AR max vel: 1.74 cm2
AV Area VTI: 1.91 cm2
AV Area mean vel: 1.89 cm2
AV Mean grad: 5 mmHg
AV Peak grad: 10 mmHg
Ao pk vel: 1.58 m/s
Area-P 1/2: 4.17 cm2
Height: 72 in
MV VTI: 1.98 cm2
S' Lateral: 3.5 cm
Weight: 3936.53 oz

## 2021-03-07 LAB — CBC WITH DIFFERENTIAL/PLATELET
Abs Immature Granulocytes: 0.12 10*3/uL — ABNORMAL HIGH (ref 0.00–0.07)
Basophils Absolute: 0 10*3/uL (ref 0.0–0.1)
Basophils Relative: 0 %
Eosinophils Absolute: 0.2 10*3/uL (ref 0.0–0.5)
Eosinophils Relative: 1 %
HCT: 36.1 % — ABNORMAL LOW (ref 39.0–52.0)
Hemoglobin: 11.2 g/dL — ABNORMAL LOW (ref 13.0–17.0)
Immature Granulocytes: 1 %
Lymphocytes Relative: 35 %
Lymphs Abs: 4.9 10*3/uL — ABNORMAL HIGH (ref 0.7–4.0)
MCH: 25.7 pg — ABNORMAL LOW (ref 26.0–34.0)
MCHC: 31 g/dL (ref 30.0–36.0)
MCV: 83 fL (ref 80.0–100.0)
Monocytes Absolute: 0.9 10*3/uL (ref 0.1–1.0)
Monocytes Relative: 7 %
Neutro Abs: 8 10*3/uL — ABNORMAL HIGH (ref 1.7–7.7)
Neutrophils Relative %: 56 %
Platelets: 213 10*3/uL (ref 150–400)
RBC: 4.35 MIL/uL (ref 4.22–5.81)
RDW: 17.7 % — ABNORMAL HIGH (ref 11.5–15.5)
WBC: 14.2 10*3/uL — ABNORMAL HIGH (ref 4.0–10.5)
nRBC: 0 % (ref 0.0–0.2)

## 2021-03-07 LAB — COMPREHENSIVE METABOLIC PANEL
ALT: 24 U/L (ref 0–44)
AST: 16 U/L (ref 15–41)
Albumin: 3.2 g/dL — ABNORMAL LOW (ref 3.5–5.0)
Alkaline Phosphatase: 60 U/L (ref 38–126)
Anion gap: 7 (ref 5–15)
BUN: 40 mg/dL — ABNORMAL HIGH (ref 8–23)
CO2: 26 mmol/L (ref 22–32)
Calcium: 9.1 mg/dL (ref 8.9–10.3)
Chloride: 103 mmol/L (ref 98–111)
Creatinine, Ser: 1.27 mg/dL — ABNORMAL HIGH (ref 0.61–1.24)
GFR, Estimated: >60 mL/min (ref >60)
Glucose, Bld: 99 mg/dL (ref 70–99)
Potassium: 5.3 mmol/L — ABNORMAL HIGH (ref 3.5–5.1)
Sodium: 136 mmol/L (ref 135–145)
Total Bilirubin: 0.5 mg/dL (ref 0.3–1.2)
Total Protein: 7.3 g/dL (ref 6.5–8.1)

## 2021-03-07 LAB — GLUCOSE, CAPILLARY
Glucose-Capillary: 95 mg/dL (ref 70–99)
Glucose-Capillary: 106 mg/dL — ABNORMAL HIGH (ref 70–99)
Glucose-Capillary: 186 mg/dL — ABNORMAL HIGH (ref 70–99)
Glucose-Capillary: 147 mg/dL — ABNORMAL HIGH (ref 70–99)

## 2021-03-07 LAB — T4, FREE: Free T4: 0.95 ng/dL (ref 0.61–1.12)

## 2021-03-07 LAB — VITAMIN B12: Vitamin B-12: 543 pg/mL (ref 180–914)

## 2021-03-07 LAB — MAGNESIUM: Magnesium: 2.3 mg/dL (ref 1.7–2.4)

## 2021-03-07 LAB — TSH: TSH: 4.14 u[IU]/mL (ref 0.350–4.500)

## 2021-03-07 IMAGING — CR DG ABDOMEN 2V
1 series · 4 of 4 positions shown · non-contrast
Comparison: Plain film of the abdomen [DATE].

CLINICAL DATA: Right side abdominal pain.

EXAM:
ABDOMEN - 2 VIEW

[Series 1: dg abd 2 views · 0.14mm/px · 4 of 4 slices shown]
[im 1/4]
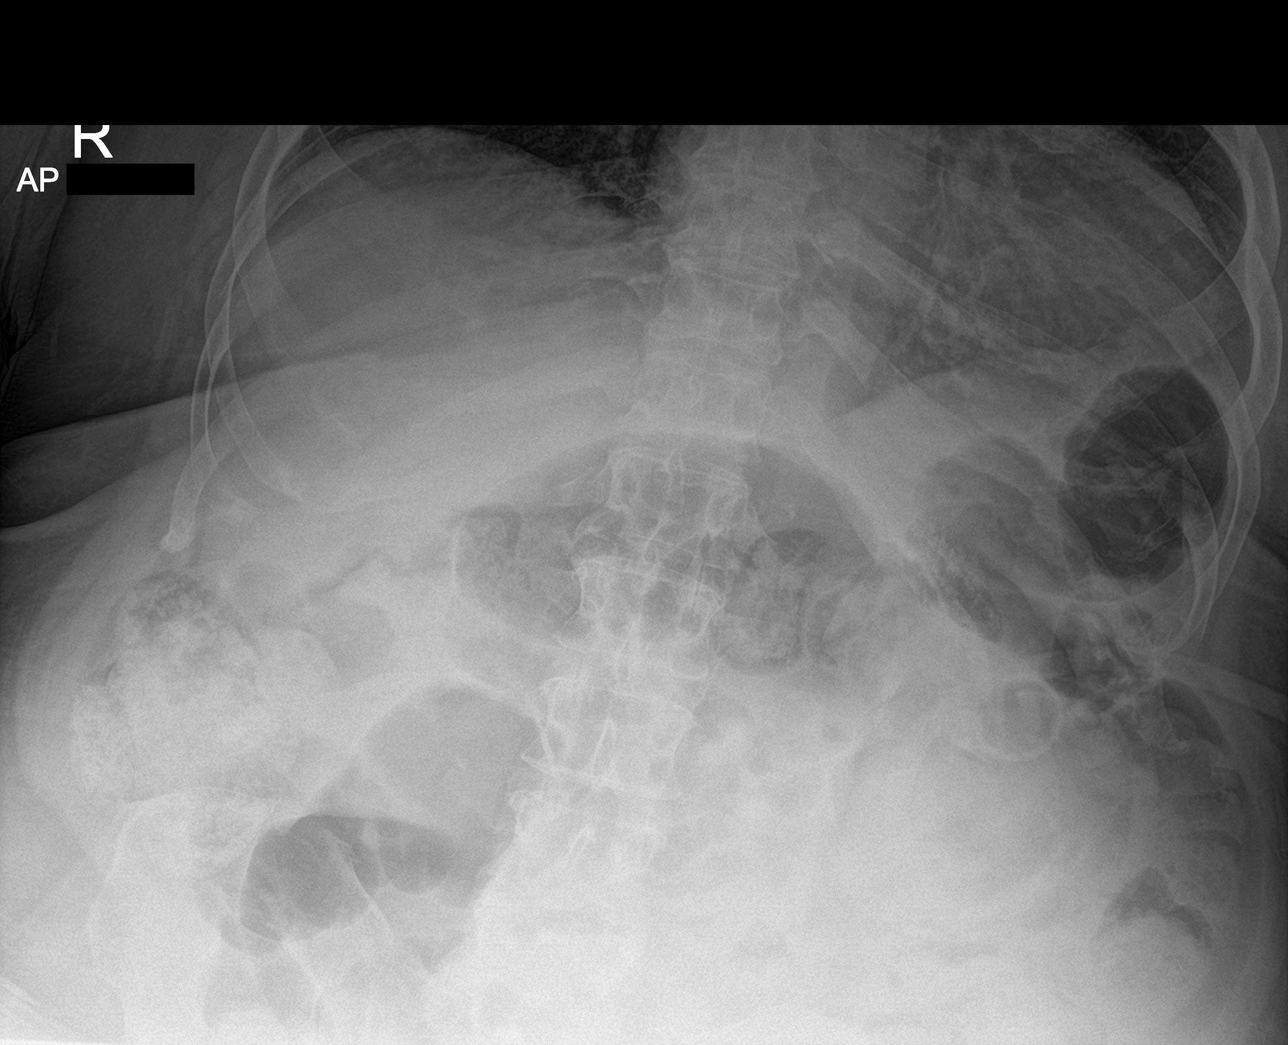
[im 2/4]
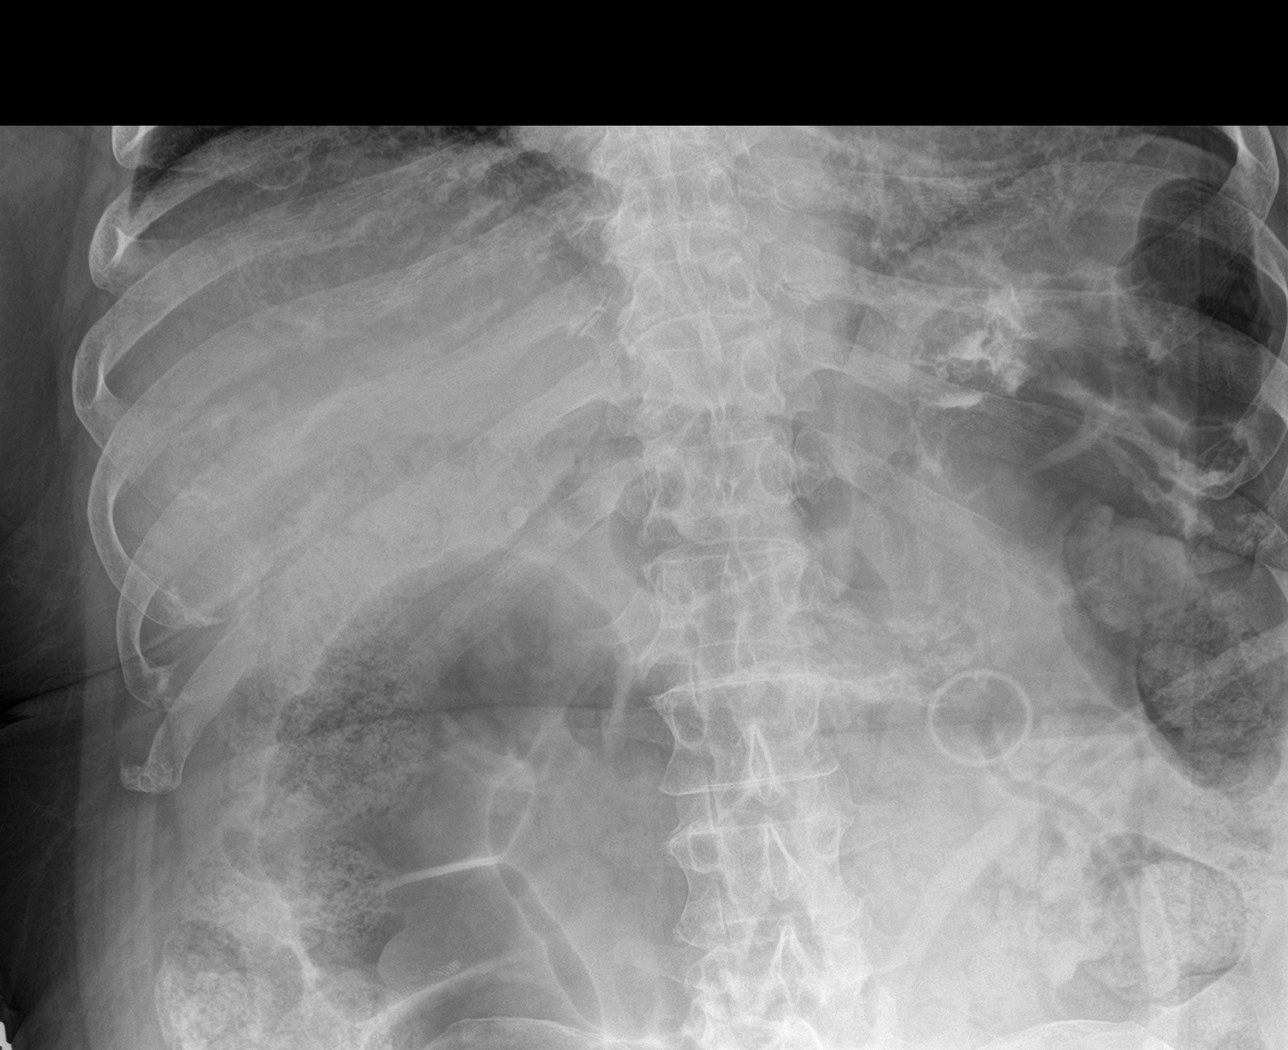
[im 3/4]
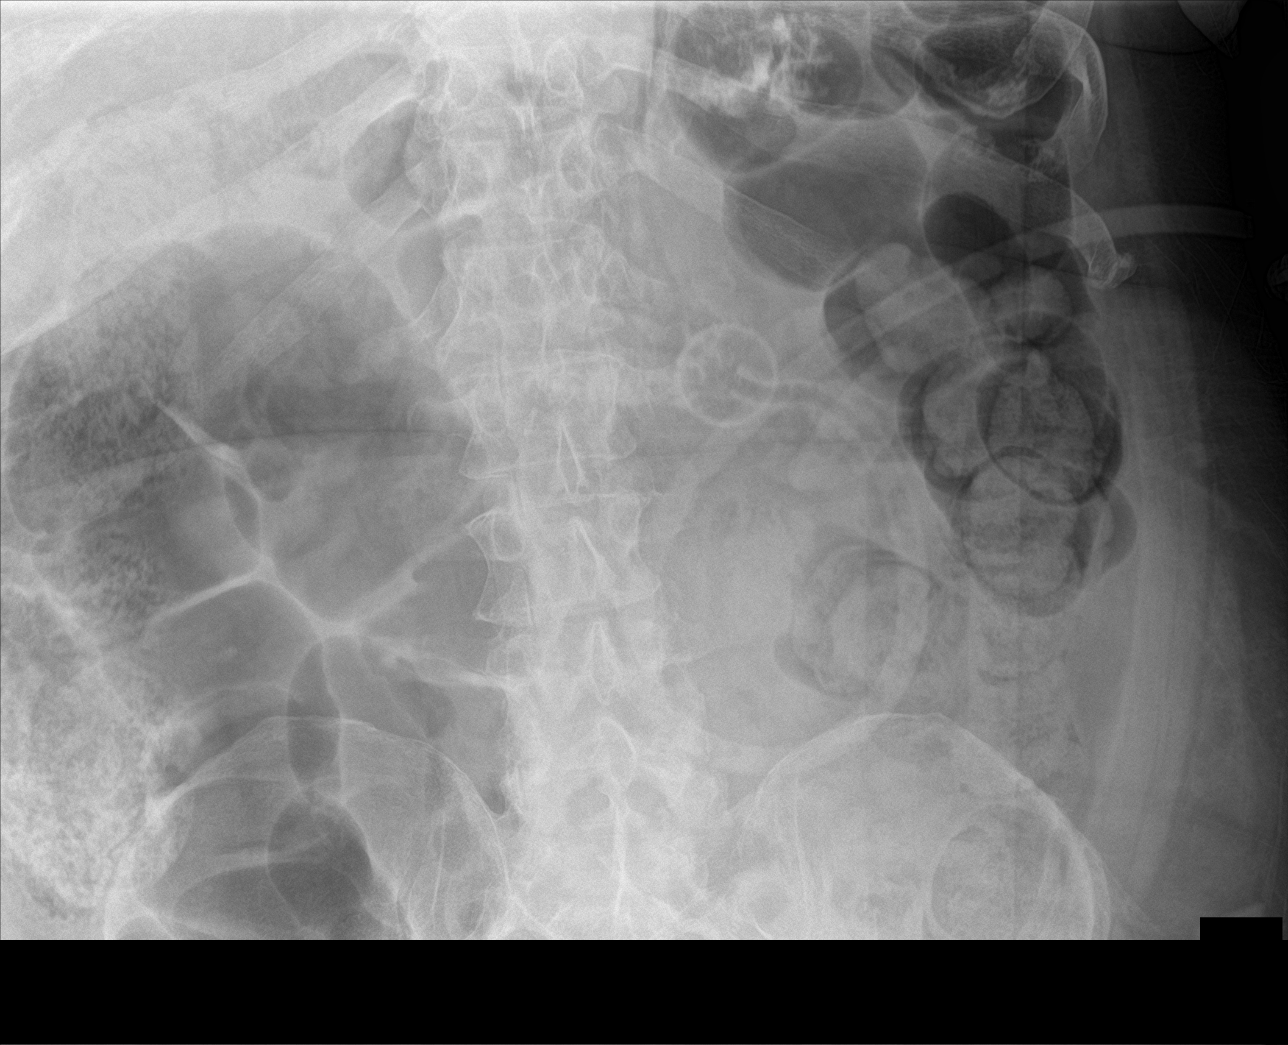
[im 4/4]
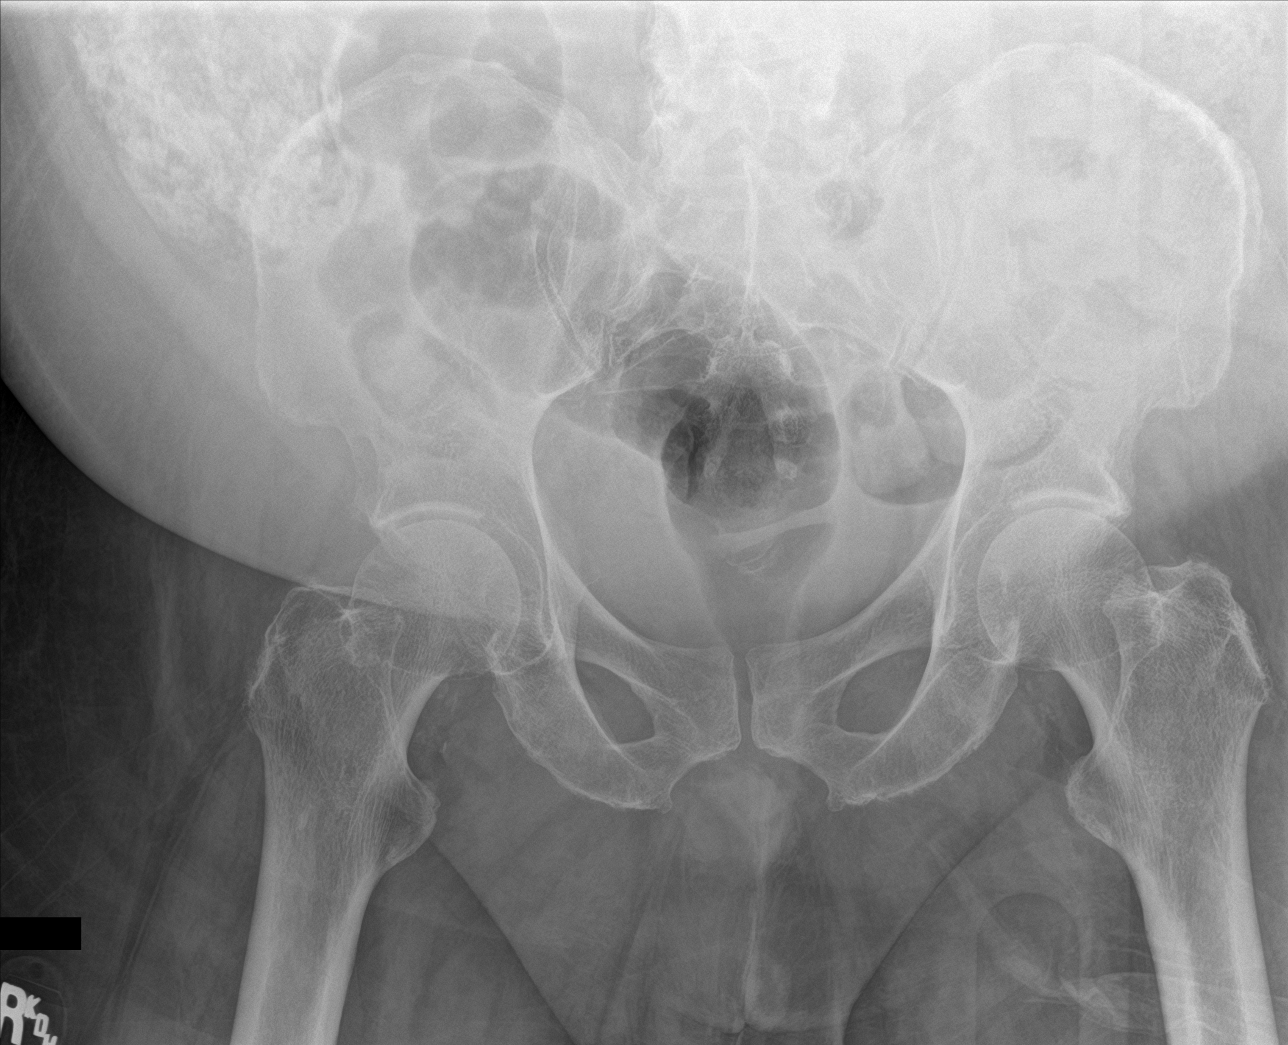

[4 of 4 positions shown; findings below may reference images not displayed]

FINDINGS: Peg tube is in place. No free intraperitoneal air or evidence of
small-bowel obstruction is identified. There is a large volume of
stool and gas throughout the colon. No abnormal abdominal
calcification or acute bony abnormality.
IMPRESSION: No acute finding.

Large volume of gas and stool throughout the colon.

## 2021-03-07 MED ORDER — SODIUM ZIRCONIUM CYCLOSILICATE 10 G PO PACK
10.0000 g | PACK | Freq: Every day | ORAL | Status: DC
  Administered 2021-03-07 – 2021-03-08 (×2): 10 g via ORAL
  Filled 2021-03-07 (×2): qty 1

## 2021-03-07 MED ORDER — SIMETHICONE 80 MG PO CHEW
80.0000 mg | CHEWABLE_TABLET | Freq: Four times a day (QID) | ORAL | Status: DC
  Administered 2021-03-07 – 2021-03-08 (×3): 80 mg via ORAL
  Filled 2021-03-07 (×3): qty 1

## 2021-03-07 MED ORDER — LOSARTAN POTASSIUM 50 MG PO TABS: 50.0000 mg | ORAL_TABLET | Freq: Every day | ORAL | Status: DC

## 2021-03-07 NOTE — Progress Notes (Signed)
PT Cancellation Note  Patient Details Name: Jeremy Browning MRN: [id number] DOB: [birth year]   Cancelled Treatment:    Reason Eval/Treat Not Completed: Other (comment). Pt with elevated K+ this AM (5.3, noted to be down from 5.6).per PT practice guidelines contraindicated for exertional activity at this time.  Will continue efforts next date pending medical stability and appropriateness.   Olga Coaster PT, DPT 10:31 AM,03/07/21

## 2021-03-07 NOTE — Progress Notes (Signed)
Occupational Therapy Treatment Patient Details Name: Jeremy Browning MRN: [id number] DOB: 1960-11-19 Today's Date: 03/07/2021    History of present illness Pt is a 60 yo male that presented from home with respiratory distress 12/25/2020. Underwent defibrillation and CPR, emergently intubated. During admission pt has had a cardiac cath, peg tube and tracheostomy placed, weaned from vent 5/26 AM. 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 Mildly restricted diffusion involving the cortex more diffusely over the right cerebral convexity favored to reflect an acute MCA territory infarct over global hypoxic injury given unilaterality".   OT comments  NT reporting pt incontinent of urine and requesting assistance for urgent BM. OT in room to assist. Pt requires MOD A for BSC t/f. SBA + setup for pericare sitting on BSC. MOD A bathing seated on BSC - asssit for BLE and underarms. MOD A don/doff gown seated EOC - assist for LUE mgmt. Pt left in bed with all needs met, call bell in reach and bed alarm set. Pt making good progress toward goals. Pt continues to benefit from skilled OT services to maximize return to PLOF and minimize risk of future falls, injury, caregiver burden, and readmission. Will continue to follow POC. Discharge recommendation remains appropriate.    Follow Up Recommendations  SNF;Supervision/Assistance - 24 hour    Equipment Recommendations  Other (comment) (TBD)    Recommendations for Other Services      Precautions / Restrictions Precautions Precautions: Fall Restrictions Weight Bearing Restrictions: No       Mobility Bed Mobility Overal bed mobility: Needs Assistance Bed Mobility: Supine to Sit;Sit to Supine     Supine to sit: Supervision Sit to supine: Supervision        Transfers Overall transfer level: Needs assistance Equipment used: 1 person hand held assist Transfers: Sit to/from [employer] Sit to Stand: Mod assist Stand pivot transfers: Mod assist;From elevated surface            Balance Overall balance assessment: Needs assistance Sitting-balance support: No upper extremity supported;Feet supported Sitting balance-Leahy Scale: Good     Standing balance support: Single extremity supported Standing balance-Leahy Scale: Fair                             ADL either performed or assessed with clinical judgement   ADL Overall ADL's : Needs assistance/impaired                                       General ADL Comments: MOD A for BSC t/f. SBA + setup for pericare sitting on BSC. MOD A bathing seated on BSC - asssit for BLE and underarms. MOD A don/doff gown seated EOC - assist for LUE mgmt      Cognition Arousal/Alertness: Awake/alert Behavior During Therapy: WFL for tasks assessed/performed Overall Cognitive Status: Impaired/Different from baseline Area of Impairment: Rancho level;Safety/judgement;Problem solving               Rancho Levels of Cognitive Functioning Rancho Los Amigos Scales of Cognitive Functioning: Confused/appropriate         Safety/Judgement: Decreased awareness of deficits;Decreased awareness of safety              Exercises Exercises: Other exercises Other Exercises Other Exercises: Pt educated re: HEP, falls prevention Other Exercises: Toileting, UB/LB bathing, don/doff gown, sup<>sit, sit<>stand,  sitting/standing balance/tolerance, SPT           Pertinent Vitals/ Pain       Pain Assessment: No/denies pain         Frequency  Min 2X/week        Progress Toward Goals  OT Goals(current goals can now be found in the care plan section)  Progress towards OT goals: Progressing toward goals  Acute Rehab OT Goals Patient Stated Goal: get better, work with therapy OT Goal Formulation: With patient Time For Goal Achievement: 03/15/21 Potential to Achieve Goals: Fair ADL  Goals Pt Will Perform Grooming: sitting;with modified independence Pt Will Perform Upper Body Dressing: with supervision;sitting Pt Will Transfer to Toilet: with mod assist;bedside commode Additional ADL Goal #1: Pt's care team will demo competency in b/l resting hand splint wear schedule and donning/doffing with no cues Additional ADL Goal #2: Pt will complete 1-2 step ADL task with MOD A Additional ADL Goal #3: Pt will experience at least 30% reduction of b/l UE edema through manual techniques as evidenced by no pitting in hands.  Plan Discharge plan remains appropriate;Frequency remains appropriate       AM-PAC OT "6 Clicks" Daily Activity     Outcome Measure   Help from another person eating meals?: A Little Help from another person taking care of personal grooming?: A Little Help from another person toileting, which includes using toliet, bedpan, or urinal?: A Little Help from another person bathing (including washing, rinsing, drying)?: A Lot Help from another person to put on and taking off regular upper body clothing?: A Lot Help from another person to put on and taking off regular lower body clothing?: A Lot 6 Click Score: 15    End of Session    OT Visit Diagnosis: Other symptoms and signs involving the nervous system (R29.898);Other symptoms and signs involving cognitive function;Muscle weakness (generalized) (M62.81)   Activity Tolerance Patient tolerated treatment well   Patient Left in bed;with call bell/phone within reach;with bed alarm set   Nurse Communication          Time: 5093-2671 OT Time Calculation (min): 17 min  Charges: OT General Charges $OT Visit: 1 Visit OT Treatments $Self Care/Home Management : 8-22 mins  Dessie Coma, M.S. OTR/L  03/07/21, 4:26 PM  ascom [phone]

## 2021-03-07 NOTE — Progress Notes (Signed)
OT Cancellation Note  Patient Details Name: Jeremy Browning MRN: [id number] DOB: 1960/10/19   Cancelled Treatment:    Reason Eval/Treat Not Completed: Medical issues which prohibited therapy. Per chart review, pt with elevated K+ (5.3); contraindicated for exertional activity at this time. Will continue to follow and initiate services as pt medically appropriate to participate in therapy.  Matthew Folks, OTR/L [facility] [phone]

## 2021-03-07 NOTE — Progress Notes (Signed)
  Speech Language Pathology Treatment: Cognitive-Linquistic  Patient Details Name: Jeremy Browning MRN: [id number] DOB: 03-04-1961 Today's Date: 03/07/2021 Time: 3536-1443 SLP Time Calculation (min) (ACUTE ONLY): 35 min  Assessment / Plan / Recommendation Clinical Impression  Pt seen for cognition therapy. Pt attempting to eat breakfast with lights off. Pt appeared frustrated because he was "eating in the dark." Pt re-oriented to call light and this writer turned on light. Pt able to scan to left of midline with supervision level cues to locate items on his tray as well as scrambled eggs on his plate. With question cues, pt able to look to calendar and then able to read correct information. Pt continues to make progress towards his goals and is appropriate for continued skilled ST services.     HPI HPI: Pt is a 60 yo male that presented from home with respiratory distress 12/25/2020. Underwent defibrillation and CPR, emergently intubated. During admission pt has had a cardiac cath, peg tube and tracheostomy placed, weaned from vent 01/13/2021. 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 Mildly restricted diffusion involving the cortex more diffusely over the right cerebral convexity favored to reflect an acute MCA territory infarct over global hypoxic injury given unilaterality".  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 Pt currently has a #6 cuffless Shiley with PMSV worn during all waking hours.      SLP Plan  Continue with current plan of care       Recommendations                   Oral Care Recommendations: Oral care BID Follow up Recommendations: Skilled Nursing facility SLP Visit Diagnosis: Cognitive communication deficit (R41.841) Plan: Continue with current plan of care       GO                Furious Chiarelli B. Dreama Saa M.S., CCC-SLP, [facility] Speech-Language Pathologist Rehabilitation Services Office [phone]  [name] 03/07/2021, 12:18 PM

## 2021-03-07 NOTE — Progress Notes (Signed)
[facility] Progress Note  Patient: Jeremy Browning    XBM:[id number]  DOA: 12/25/2020     Date of Service: the patient was seen and examined on 03/07/2021  Brief hospital course: Jeremy Browning is an 60 y.o. male with obesity, DM2, HTN, combined CHF who was admitted on 12/25/2020 with respiratory distress. He was intubated in the ER had VT requiring epi/cardioversion. Hospital course complicated by persistent vent dependent respiratory failure eventually requiring tracheostomy, anoxic encephalopathy. Patient had coronary angiogram performed 5/7, normal coronary arteries. Echocardiogram showed ejection fraction 45 to 50% with grade 1 diastolic dysfunction. Patient since has improved. Jeremy Browning is removed.  Patient is able to tolerate oral diet, not receiving tube feeding.  5/7 severe resp failure 5/9 multiorgan failure MRI shows brain injury 5/12 failed weaning trials due to severe brain damage 5/17 no purposeful movement on wake up assessment, becomes tachypneic, asynchronous with vent 5/18 remains critically ill, severe brain damage, patient made DNR 5/20 family decided on TRACH/PEG tube 5/25 [facility] PLACED by ENT 5/26 s/p trach PEG tube 6/2 tolerates Trach collar @ 28% FiO2 6/7: declined for LTAC.  Patient is becoming little more responsive 6/12 mental status better, starting to talk with valve 6/23 d/w ENT rec decanulation and contacted RT and  RT decannulated pt to RA 7/13 tube feeds discontinued.  Tolerating oral diet.  If continues to tolerate adequate calories, remove PEG tube August 1 week. 7/17 neuro reconsulted.  Repeat work-up initiated.    Currently plan is arrange for safe discharge plan.  Subjective: Reports left-sided abdominal pain.  No nausea no vomiting.  No fever no chills.  Oral intake adequate.  Assessment and Plan: Acute hypoxic respiratory failure present on admission. Out-of-hospital respiratory arrest Cardiac arrest-V. tach Acute systolic and diastolic CHF with  flash pulm edema Proteus pneumonia EMS found patient with saturation in 70s on room air at home placed on CPAP. On arrival to ED patient was found unresponsive and pulseless treated while in the ICU, completed a course of antibiotics with ceftriaxone and Unasyn. Require tracheostomy, removed on 6/23. Currently on room air.   Cardiac arrest  V. Tach  non-STEMI, concern for STEMI on admission acute combined systolic and diastolic CHF  cardiogenic shock Requiring pressors while in the ICU, currently resolved. Cardiology consulted and underwent a cardiac cath on 5/7 which showed normal coronaries. 2D echo done on 5/9 showed an EF of 45-50% with global hypokinesis of the left ventricle.   It also showed grade 1 diastolic dysfunction.  Appears euvolemic Currently on carvedilol, losartan.  Will discuss with cardiology regarding transition to [facility]. Was on hydralazine and started on Norvasc. No arrhythmia Repeat echocardiogram ordered per neurology.  Anoxic brain injury. Acute CVA Mentation improving. Still has some confusion. MRI brain shows small acute infarct in right cerebellum and right cerebral hemisphere. Neurology consulted. 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 EEG shows no evidence of acute seizure. Neurology consult on 7/17.  Suspect the patient had pre-existing chronic occlusion or stenosis that resulted in asymmetric hypoxic brain injury rather than acute embolism during cardiac arrest. MRI brain show right MCA infarct. Aspirin Lipitor initiated.   Acute kidney injury Hyperkalemia Hypernatremia Renal function on admission was 2.16. Currently improved to normal. Potassium level was also elevated on admission.  Currently mildly elevated. Sodium level worsen while in the hospital and resolved with treatment. Reduce losartan.  Treat with Lokelma.  For now monitor potassium.  Type 2 diabetes mellitus uncontrolled with hyperglycemia. Hemoglobin A1c 7.1. Currently sliding  scale insulin.  Sick euthyroid syndrome. TSH was 8.2 on admission.  Repeat value 4.14. Free T4 was 0.6.  Repeat value 0.95.   Elevated LFTs  Noticed on 6/11, now resolved. Hepatitis panel negative,  right upper quadrant ultrasound negative.   Anemia of chronic disease hemoglobin stable, no bleeding   Nutrition Patient was on PEG tube feeding. Now mentation improving therefore tolerating p.o. with adequate caloric intake. Tube feeds have been discontinued. On dysphagia 2 diet. Speech therapy following.  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 Recommends steroids for now with a taper.  Constipation. Bowel regimen continue. X-ray abdomen shows evidence of gas and stool.   Scheduled Meds:  vitamin C  250 mg Oral BID   aspirin EC  81 mg Oral Daily   atorvastatin  80 mg Oral Daily   carvedilol  6.25 mg Oral BID WC   diclofenac Sodium  2 g Topical QID   docusate sodium  100 mg Oral BID   enoxaparin (LOVENOX) injection  0.5 mg/kg Subcutaneous Q24H   famotidine  20 mg Oral Daily   feeding supplement (NEPRO CARB STEADY)  237 mL Oral TID BM   free water  30 mL Per Tube Q4H   insulin aspart  0-15 Units Subcutaneous TID WC   insulin aspart  0-5 Units Subcutaneous QHS   [START ON 03/08/2021] losartan  50 mg Oral Daily   mouth rinse  15 mL Mouth Rinse BID   multivitamin with minerals  1 tablet Per Tube Daily   neomycin-bacitracin-polymyxin   Topical BID   polyethylene glycol  17 g Oral Daily   predniSONE  20 mg Oral Q breakfast   Followed by   Derrill Memo ON 03/12/2021] predniSONE  10 mg Oral Q breakfast   Followed by   Derrill Memo ON 03/17/2021] predniSONE  5 mg Oral Q breakfast   sodium zirconium cyclosilicate  10 g Oral Daily   Continuous Infusions: PRN Meds: acetaminophen (TYLENOL) oral liquid 160 mg/5 mL, albuterol, hydrALAZINE, lip balm, oxyCODONE, polyvinyl  alcohol  Body mass index is 33.37 kg/m.  Nutrition Problem: Inadequate oral intake Etiology: inability to eat (pt sedated and ventilated) Pressure Injury Buttocks Left (Active)     Location: Buttocks  Location Orientation: Left  Staging:   Wound Description (Comments):   Present on Admission:      Pressure Injury 02/09/21 Buttocks Medial Stage 2 -  Partial thickness loss of dermis presenting as a shallow open injury with a red, pink wound bed without slough. (Active)  02/09/21 1209  Location: Buttocks  Location Orientation: Medial  Staging: Stage 2 -  Partial thickness loss of dermis presenting as a shallow open injury with a red, pink wound bed without slough.  Wound Description (Comments):   Present on Admission:      Pressure Injury 02/09/21 Buttocks Left Stage 2 -  Partial thickness loss of dermis presenting as a shallow open injury with a red, pink wound bed without slough. (Active)  02/09/21 1213  Location: Buttocks  Location Orientation: Left  Staging: Stage 2 -  Partial thickness loss of dermis presenting as a shallow open injury with a red, pink wound bed without slough.  Wound Description (Comments):   Present on Admission:     DVT Prophylaxis: Lovenox  Advance goals of care discussion: Pt is DNR.  Family Communication: no family was present at bedside, at the time of interview.   Physical Exam:  General: Appear in mild distress, no Rash; Oral Mucosa Clear, moist. no Abnormal Neck Mass Or lumps, Conjunctiva normal  Cardiovascular: S1 and S2 Present, no Murmur, Respiratory: good respiratory effort, Bilateral Air entry present and CTA, no Crackles, no wheezes Abdomen: Bowel Sound present, Soft and no tenderness Extremities: no Pedal edema Neurology: alert and oriented to time, place, and person affect appropriate. no new focal deficit, left-sided weakness Gait not checked due to patient safety concerns   Vitals:   03/06/21 1951 03/07/21 0446 03/07/21 0932  03/07/21 1200  BP: (!) 131/40 (!) 139/46 (!) 121/42   Pulse: (!) 50 (!) 50 63   Resp: 20 16    Temp: 98.3 F (36.8 C) 97.7 F (36.5 C) (!) 97.5 F (36.4 C) 97.9 F (36.6 C)  TempSrc: Oral Oral Oral Oral  SpO2: 100% 100%    Weight:      Height:       Disposition:  Status is: Inpatient  Remains inpatient appropriate because:Unsafe d/c plan  Dispo: The patient is from: Home              Anticipated d/c is to: to be determined               Patient currently is medically stable to d/c.   Difficult to place patient Yes  Time spent: 35 minutes. I reviewed all nursing notes, pharmacy notes, vitals, pertinent old records. I have discussed plan of care as described above with RN.  Author: Berle Mull, MD [facility] 03/07/2021 7:28 PM  To reach On-call, see care teams to locate the attending and reach out via [email or web address]. Between 7PM-7AM, please contact night-coverage If you still have difficulty reaching the attending provider, please page the [facility] (Director on Call) for [facility] on amion for assistance.

## 2021-03-07 NOTE — TOC Progression Note (Signed)
Transition of Care [facility]) - Progression Note    Patient Details  Name: Jeremy Browning MRN: [id number] Date of Birth: [birth year]  [facility]) CM/SW Contact  Chapman Fitch, RN Phone Number: 03/07/2021, 4:28 PM  Clinical Narrative:    Per Kenney Houseman at [facility] they can offer a bed Spoke to sister Marylene Land and sister in law Joni Reining they are bot in agreement to accept bed offer  Kenney Houseman states they have started insurance auth   Expected Discharge Plan: Skilled Nursing Facility Barriers to Discharge: Continued Medical Work up  Expected Discharge Plan and Services Expected Discharge Plan: Skilled Nursing Facility In-house Referral: Clinical Social Work   Post Acute Care Choice: Skilled Nursing Facility                                         Social Determinants of Health (SDOH) Interventions    Readmission Risk Interventions No flowsheet data found.

## 2021-03-07 NOTE — Progress Notes (Signed)
*  PRELIMINARY RESULTS* Echocardiogram 2D Echocardiogram has been performed.  Jeremy Browning 03/07/2021, 10:32 AM

## 2021-03-07 NOTE — Progress Notes (Signed)
Report received on pt will continue to monitor and follow plan of care. Pt resting with no c/o pain or needs at present time, call light in reach, bed low and locked

## 2021-03-07 NOTE — Progress Notes (Signed)
Subjective: No significant change.   Exam: Vitals:   03/06/21 1951 03/07/21 0446  BP: (!) 131/40 (!) 139/46  Pulse: (!) 50 (!) 50  Resp: 20 16  Temp: 98.3 F (36.8 C) 97.7 F (36.5 C)  SpO2: 100% 100%   Gen: In bed, NAD Resp: non-labored breathing, no acute distress Abd: soft, nt  Neuro: MS: awake, alert, oriented to month, year, knows "hospital" but not which one.  CN:VFF, EOMI, left facial weakness Motor: he has distal > proximal weakness on the left, with that ability to raise his left arm against gravity, but limited hand functionality Sensory:decreased throughout the left side. He has extinction to double simultaneous stimulation Pertinent Labs: Cr 1.27 TSH 4.1 LDL 95 A1C  CTA - stenosis vs occlusion of the right M1.  MRI - R MCA infarct.  This is mostly restricted the cortex, I also think that there is some similar T2 change in the posterior watershed region on the left, though much more limited.  Impression: 60 yo M with R MCA infarct in the setting of cardiac arrest. The isolation of the injury to the cortex in the area of the stroke with sparing of the subcortical white matter makes me strongly suspect that this was a pre-existing chronic occlusion or stenosis that resulted in an asymmetric hypoxic brain injury rather than an acute embolic occlusion associated with the cardiac arrest.   At this point, management would focus on secondary prevention. As we are already more than two months out, and in the setting of previous subacute blood, I would favor single antiplatelet therapy with aggressive lipid control rather than DAPT as is done typically with ICAD for the first 90 days.   I do not feel there is any role for interventional treatment at the current time as he has not failed maximal medical therapy.   Recommendations: 1) continue ASA 81mg  daily.  2) agree with atorvastatin 80mg  daily.  3) PT,OT,ST.  4) neurology will be available on an as-needed  basis.  , MD [facility] [phone]  If 7pm- 7am, please page neurology on call as listed in AMION.

## 2021-03-08 LAB — RESP PANEL BY RT-PCR (FLU A&B, COVID) ARPGX2
Influenza A by PCR: NEGATIVE
Influenza B by PCR: NEGATIVE
SARS Coronavirus 2 by RT PCR: NEGATIVE

## 2021-03-08 LAB — BASIC METABOLIC PANEL
Anion gap: 5 (ref 5–15)
BUN: 41 mg/dL — ABNORMAL HIGH (ref 8–23)
CO2: 28 mmol/L (ref 22–32)
Calcium: 8.7 mg/dL — ABNORMAL LOW (ref 8.9–10.3)
Chloride: 107 mmol/L (ref 98–111)
Creatinine, Ser: 1.3 mg/dL — ABNORMAL HIGH (ref 0.61–1.24)
GFR, Estimated: >60 mL/min (ref >60)
Glucose, Bld: 117 mg/dL — ABNORMAL HIGH (ref 70–99)
Potassium: 5.1 mmol/L (ref 3.5–5.1)
Sodium: 140 mmol/L (ref 135–145)

## 2021-03-08 LAB — GLUCOSE, CAPILLARY
Glucose-Capillary: 88 mg/dL (ref 70–99)
Glucose-Capillary: 133 mg/dL — ABNORMAL HIGH (ref 70–99)

## 2021-03-08 MED ORDER — POLYETHYLENE GLYCOL 3350 17 G PO PACK: 17.0000 g | PACK | Freq: Every day | ORAL | 0 refills | Status: DC

## 2021-03-08 MED ORDER — SIMETHICONE 80 MG PO CHEW
80.0000 mg | CHEWABLE_TABLET | Freq: Four times a day (QID) | ORAL | 0 refills | Status: DC
Start: 2021-03-08 — End: 2021-11-08

## 2021-03-08 MED ORDER — CARVEDILOL 6.25 MG PO TABS
6.2500 mg | ORAL_TABLET | Freq: Two times a day (BID) | ORAL | 0 refills | Status: DC
Start: 2021-03-08 — End: 2022-03-08

## 2021-03-08 MED ORDER — NEPRO/CARBSTEADY PO LIQD: 237.0000 mL | Freq: Three times a day (TID) | ORAL | 0 refills | Status: DC

## 2021-03-08 MED ORDER — AMLODIPINE BESYLATE 2.5 MG PO TABS: 2.5000 mg | ORAL_TABLET | Freq: Every day | ORAL | 11 refills | Status: DC

## 2021-03-08 MED ORDER — DOCUSATE SODIUM 100 MG PO CAPS: 100.0000 mg | ORAL_CAPSULE | Freq: Two times a day (BID) | ORAL | 0 refills | Status: DC

## 2021-03-08 MED ORDER — PREDNISONE 10 MG PO TABS: ORAL_TABLET | ORAL | 0 refills | Status: DC

## 2021-03-08 MED ORDER — DICLOFENAC SODIUM 1 % EX GEL: 2.0000 g | Freq: Four times a day (QID) | CUTANEOUS | 0 refills | Status: DC

## 2021-03-08 MED ORDER — ADULT MULTIVITAMIN W/MINERALS CH: 1.0000 | ORAL_TABLET | Freq: Every day | ORAL | 0 refills | Status: DC

## 2021-03-08 MED ORDER — FAMOTIDINE 20 MG PO TABS: 20.0000 mg | ORAL_TABLET | Freq: Every day | ORAL | 0 refills | Status: DC

## 2021-03-08 MED ORDER — FREE WATER: 30.0000 mL | 0 refills | Status: DC

## 2021-03-08 NOTE — Progress Notes (Signed)
Chaplain Maggie made follow up visit with patient who was seated in the recliner. We celebrated the 70 feet he walked this morning. Patient shared news about upcoming transition to a SNF. When asked how he feels about it he recognized that the move will require him to meet new people. Chaplain offered encouragement and expressed gratitude for the time spent with the patient. Continued support available as needed per on call chaplain.

## 2021-03-08 NOTE — Progress Notes (Signed)
Physical Therapy Treatment Patient Details Name: Jeremy Browning MRN: [id number] DOB: February 27, 1961 Today's Date: 03/08/2021    History of Present Illness Pt is a 60 yo male that presented from home with respiratory distress 12/25/2020. Underwent defibrillation and CPR, emergently intubated. During admission pt has had a cardiac cath, peg tube and tracheostomy placed, weaned from vent 5/26 AM. 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 Mildly restricted diffusion involving the cortex more diffusely over the right cerebral convexity favored to reflect an acute MCA territory infarct over global hypoxic injury given unilaterality".    PT Comments    Pt alert, motivated for PT. Pt transferred bed > BSC w/ lateral scoot, supervision. Pt able to perform pericare while seated without assist. Overall mobility continues to improve requiring less overall assistance. Pt progressed sit >stand w/ min-guard & RW, requires +1 attempts at task, cues for technique. Ambulation distance increased to a total of 70 ft (30 ft > rest break > 40 ft) w/ RW, min-guard. Pt continues to build strength and endurance making daily progression towards all goals. Skilled PT intervention is indicated to address deficits in function, mobility, and to return to PLOF as able.  Discharge recommendation is CIR.    Follow Up Recommendations  CIR     Equipment Recommendations  None recommended by PT    Recommendations for Other Services       Precautions / Restrictions Precautions Precautions: Fall Precaution Comments: PEG Required Braces or Orthoses: Other Brace Other Brace: L hand orthosis on RW Restrictions Weight Bearing Restrictions: No    Mobility  Bed Mobility Overal bed mobility: Needs Assistance Bed Mobility: Supine to Sit     Supine to sit: HOB elevated;Supervision          Transfers Overall transfer level: Needs assistance Equipment used: Rolling walker (2  wheeled) Transfers: Sit to/from Stand Sit to Stand: Min guard        Lateral/Scoot Transfers: Min guard General transfer comment: Progressed to min-gaurd for sit > stand w/ 1+ attempts, corrects 2nd attempt w/ vc's for technique  Ambulation/Gait Ambulation/Gait assistance: Min guard Gait Distance (Feet): 70 Feet Assistive device: Rolling walker (2 wheeled) Gait Pattern/deviations: Decreased step length - right;Decreased step length - left;Decreased dorsiflexion - left;Decreased dorsiflexion - right;Decreased weight shift to left;Decreased stride length Gait velocity: decreased   General Gait Details: 30 ft > rest break > 40 ft w/ RW, CGA; improved endurance   Stairs             Wheelchair Mobility    Modified Rankin (Stroke Patients Only)       Balance Overall balance assessment: Needs assistance Sitting-balance support: No upper extremity supported;Feet supported Sitting balance-Leahy Scale: Good     Standing balance support: During functional activity;Bilateral upper extremity supported Standing balance-Leahy Scale: Fair                              Cognition Arousal/Alertness: Awake/alert Behavior During Therapy: WFL for tasks assessed/performed Overall Cognitive Status: Impaired/Different from baseline Area of Impairment: Rancho level;Safety/judgement;Problem solving               Rancho Levels of Cognitive Functioning Rancho Los Amigos Scales of Cognitive Functioning: Confused/appropriate   Current Attention Level: Selective Memory: Decreased short-term memory Following Commands: Follows one step commands consistently Safety/Judgement: Decreased awareness of deficits;Decreased awareness of safety Awareness: Emergent   General Comments: conversations are tangential  Exercises Other Exercises Other Exercises: Bed > BSC, supervision; pt performed pericare    General Comments        Pertinent Vitals/Pain Pain Assessment:  No/denies pain    Home Living                      Prior Function            PT Goals (current goals can now be found in the care plan section) Acute Rehab PT Goals Patient Stated Goal: get better, work with therapy PT Goal Formulation: With patient Time For Goal Achievement: 03/13/21 Potential to Achieve Goals: Good Progress towards PT goals: Progressing toward goals    Frequency    Min 5X/week      PT Plan Current plan remains appropriate    Co-evaluation              AM-PAC PT "6 Clicks" Mobility   Outcome Measure  Help needed turning from your back to your side while in a flat bed without using bedrails?: A Little Help needed moving from lying on your back to sitting on the side of a flat bed without using bedrails?: A Little Help needed moving to and from a bed to a chair (including a wheelchair)?: A Little Help needed standing up from a chair using your arms (e.g., wheelchair or bedside chair)?: A Little Help needed to walk in hospital room?: A Little Help needed climbing 3-5 steps with a railing? : A Lot 6 Click Score: 17    End of Session Equipment Utilized During Treatment: Gait belt Activity Tolerance: Patient tolerated treatment well Patient left: in chair;with call bell/phone within reach;with chair alarm set;with family/visitor present   PT Visit Diagnosis: Other abnormalities of gait and mobility (R26.89);Muscle weakness (generalized) (M62.81);Other symptoms and signs involving the nervous system (R29.898);Hemiplegia and hemiparesis Hemiplegia - Right/Left: Left Hemiplegia - dominant/non-dominant: Non-dominant Hemiplegia - caused by: Cerebral infarction     Time: 2248-2500 PT Time Calculation (min) (ACUTE ONLY): 47 min  Charges:                        [employer], SPT

## 2021-03-08 NOTE — Discharge Summary (Signed)
[facility] Discharge Summary   Patient: Jeremy Browning MOL:[id number]  PCP: No primary care provider on file.  Date of admission: 12/25/2020   Date of discharge:  03/08/2021     Discharge Diagnoses:  Principal diagnosis cardiac arrest  Active Problems:   Cardiogenic shock [facility])   Cardiac arrest with successful resuscitation ([place])   Flash pulmonary edema ([place])   Cardiopulmonary arrest with successful resuscitation ([place])   Acute respiratory failure with hypoxia and hypercapnia (HCC)   Acute renal failure (HCC)   Transaminitis   Second degree heart block   Lactic acidosis   Aspiration pneumonia (HCC)   HFrEF (heart failure with reduced ejection fraction) (HCC)   Severe hypoxic-ischemic encephalopathy   SOB (shortness of breath)   Oropharyngeal dysphagia   Pressure injury of skin   Right knee DJD   Essential hypertension   Acute on chronic diastolic CHF (congestive heart failure) ([place])   Proteus pneumonia ([place])   Admitted From: Home Disposition:  SNF   Recommendations for Outpatient Follow-up:  PCP: Follow-up with PCP in 1 week.  Follow-up with cardiology in 1 week.  Follow-up with neurology as recommended. If patient is able to tolerate p.o. diet patient will require follow-up with GI to removal of the PEG tube.   Follow-up Information     PCP. Schedule an appointment as soon as possible for a visit in 1 week(s).          [place]. Schedule an appointment as soon as possible for a visit in 2 week(s).   Specialty: Cardiology Contact information: [address] [place] [phone]               Discharge Instructions     Ambulatory referral to Cardiology   Complete by: As directed    Ambulatory referral to Gastroenterology   Complete by: As directed    Removal of PEG tube   Ambulatory referral to Neurology   Complete by: As directed    An appointment is requested in approximately: 8 weeks    DIET DYS 2   Complete by: As directed    Fluid consistency: Thin   Increase activity slowly   Complete by: As directed    No wound care   Complete by: As directed        Diet recommendation: Dysphagia type 2 thin Liquid  Activity: The patient is advised to gradually reintroduce usual activities, as tolerated  Discharge Condition: stable  Code Status: DNR   History of present illness: As per the H and P dictated on admission, "60 yo M presenting to [facility] ED from home via EMS with respiratory distress that had been progressively worsening over the course of 12/25/20. Per documentation family stated the patient was in his normal state of health until this morning when he began feeling unwell. He then called out stating he could not breathe. EMS found the patient hypoxic and in distress with SpO2 in the 70's on room air. Patient was place on CPAP, arriving to the ED unresponsive and pulseless. ED course: Upon arrival to ED patient was defibrillated x 1 and received CPR before achieving ROSC. Initial EKG suggestive of global ischemia, second EKG had possible LBBB vs IVCD. Patient was emergently intubated requiring mechanical ventilation. Per ED documentation STEMI team activated in the setting of a witnessed shockable cardiac arrest with clinical exam highly suggestive of pulmonary edema with pink frothy sputum throughout ET tubing.  "  Hospital Course:  Jeremy Cruise  Browning is an 60 y.o. male with obesity, DM2, HTN, combined CHF who was admitted on 12/25/2020 with respiratory distress. He was intubated in the ER had VT requiring epi/cardioversion. Hospital course complicated by persistent vent dependent respiratory failure eventually requiring tracheostomy, anoxic encephalopathy. Patient had coronary angiogram performed 5/7, normal coronary arteries. Echocardiogram showed ejection fraction 45 to 50% with grade 1 diastolic dysfunction. Patient since has improved. Jeremy Browning is removed.  Patient is able to  tolerate oral diet, not receiving tube feeding.   5/7 severe resp failure 5/9 multiorgan failure MRI shows brain injury 5/12 failed weaning trials due to severe brain damage 5/17 no purposeful movement on wake up assessment, becomes tachypneic, asynchronous with vent 5/18 remains critically ill, severe brain damage, patient made DNR 5/20 family decided on TRACH/PEG tube 5/25 [facility] PLACED by ENT 5/26 s/p trach PEG tube 6/2 tolerates Trach collar @ 28% FiO2 6/7: declined for LTAC.  Patient is becoming little more responsive 6/12 mental status better, starting to talk with valve 6/23 d/w ENT rec decanulation and contacted RT and  RT decannulated pt to RA 7/13 tube feeds discontinued.  Tolerating oral diet.  If continues to tolerate adequate calories, remove PEG tube August 1 week. 7/17 neuro reconsulted.  Repeat work-up completed.    Summary of his active problems in the hospital is as following.  Acute hypoxic respiratory failure present on admission. Out-of-hospital respiratory arrest Cardiac arrest-V. tach Acute systolic and diastolic CHF with flash pulm edema Proteus pneumonia EMS found patient with saturation in 70s on room air at home placed on CPAP. On arrival to ED patient was found unresponsive and pulseless treated while in the ICU, completed a course of antibiotics with ceftriaxone and Unasyn. Require tracheostomy, removed on 6/23. Currently on room air.   Cardiac arrest V. Tach non-STEMI, concern for STEMI on admission acute combined systolic and diastolic CHF cardiogenic shock Requiring pressors while in the ICU, currently resolved. Cardiology consulted and underwent a cardiac cath on 5/7 which showed normal coronaries. 2D echo done on 5/9 showed an EF of 45-50% with global hypokinesis of the left ventricle.   It also showed grade 1 diastolic dysfunction.  Appears euvolemic Currently on carvedilol, losartan.   Losartan discontinued due to hyperkalemia. Outpatient  follow-up with cardiology regarding transition to [facility]. Was on hydralazine and started on Norvasc. No arrhythmia Repeat echocardiogram shows improved EF without any significant valvular abnormality or wall motion abnormality.   Anoxic brain injury. Acute CVA Mentation improving. Still has some confusion. MRI brain shows small acute infarct in right cerebellum and right cerebral hemisphere. Neurology consulted. 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 EEG shows no evidence of acute seizure. Neurology consult on 7/17.  Suspect the patient had pre-existing chronic occlusion or stenosis that resulted in asymmetric hypoxic brain injury rather than acute embolism during cardiac arrest. MRI brain show right MCA infarct. Aspirin Lipitor initiated.   Acute kidney injury Hyperkalemia Hypernatremia Renal function on admission was 2.16. Currently improved to normal. Potassium level was also elevated on admission.  Currently mildly elevated. Sodium level worsen while in the hospital and resolved with treatment. Discontinue losartan. Treated with Lokelma.  HTN. Stopping losartan due to hyperkalemia and renal dysfunction. Adding Norvasc Continue Coreg. Follow-up with cardiology for GDMT.   Type 2 diabetes mellitus uncontrolled with hyperglycemia. Hemoglobin A1c 7.1. Currently sliding scale insulin.   Sick euthyroid syndrome. TSH was 8.2 on admission.  Repeat value 4.14. Free T4 was 0.6.  Repeat value 0.95.   Elevated LFTs Noticed on  6/11, now resolved. Hepatitis panel negative, right upper quadrant ultrasound negative.   Anemia of chronic disease hemoglobin stable, no bleeding   Nutrition Patient was on PEG tube feeding. Now mentation improving therefore tolerating p.o. with adequate caloric intake. Tube feeds have been discontinued. On dysphagia 2 diet. Speech therapy following.   Right knee swelling with decreased ROM  Severe DJD on xray knee ESR 115 , x-ray to view severe  tricompartmental DJD PRN Tylenol Orthopedic consulted. Recommends steroids for now with a taper.   Constipation. Bowel regimen continue. X-ray abdomen shows evidence of gas and stool.  Body mass index is 33.37 kg/m.  Nutrition Problem: Inadequate oral intake Etiology: inability to eat (pt sedated and ventilated) Nutrition Interventions:    Pressure Injury Buttocks Left (Active)     Location: Buttocks  Location Orientation: Left  Staging:   Wound Description (Comments):   Present on Admission:      Pressure Injury 02/09/21 Buttocks Medial Stage 2 -  Partial thickness loss of dermis presenting as a shallow open injury with a red, pink wound bed without slough. (Active)  02/09/21 1209  Location: Buttocks  Location Orientation: Medial  Staging: Stage 2 -  Partial thickness loss of dermis presenting as a shallow open injury with a red, pink wound bed without slough.  Wound Description (Comments):   Present on Admission:      Pressure Injury 02/09/21 Buttocks Left Stage 2 -  Partial thickness loss of dermis presenting as a shallow open injury with a red, pink wound bed without slough. (Active)  02/09/21 1213  Location: Buttocks  Location Orientation: Left  Staging: Stage 2 -  Partial thickness loss of dermis presenting as a shallow open injury with a red, pink wound bed without slough.  Wound Description (Comments):   Present on Admission:       Patient was seen by physical therapy, who recommended SNF. On the day of the discharge the patient's vitals were stable, and no other new acute medical condition were reported. The patient was felt safe to be discharge at SNF with Therapy.  Consultants: Primary admission with PCCM Neurology GI for PEG tube placement Cardiology Nephrology Inpatient rehab ENT for tracheostomy Palliative care Procedures: Intubation Central line placement Cardiac catheterization EEG PEG tube placement with upper GI endoscopy  DISCHARGE  MEDICATION: Allergies as of 03/08/2021   Not on File      Medication List     STOP taking these medications    furosemide 80 MG tablet Commonly known as: LASIX   losartan 50 MG tablet Commonly known as: COZAAR       TAKE these medications    amLODipine 2.5 MG tablet Commonly known as: NORVASC Take 1 tablet (2.5 mg total) by mouth daily.   aspirin EC 81 MG tablet Take 81 mg by mouth daily. Swallow whole.   atorvastatin 80 MG tablet Commonly known as: LIPITOR Take 80 mg by mouth daily.   carvedilol 6.25 MG tablet Commonly known as: COREG Take 1 tablet (6.25 mg total) by mouth 2 (two) times daily with a meal. What changed:  medication strength how much to take   diclofenac Sodium 1 % Gel Commonly known as: VOLTAREN Apply 2 g topically 4 (four) times daily.   docusate sodium 100 MG capsule Commonly known as: COLACE Take 1 capsule (100 mg total) by mouth 2 (two) times daily.   famotidine 20 MG tablet Commonly known as: PEPCID Take 1 tablet (20 mg total) by mouth daily. Start taking on:  March 09, 2021   feeding supplement (NEPRO CARB STEADY) Liqd Take 237 mLs by mouth 3 (three) times daily between meals.   free water Soln Place 30 mLs into feeding tube every 4 (four) hours.   multivitamin with minerals Tabs tablet Place 1 tablet into feeding tube daily. Start taking on: March 09, 2021   polyethylene glycol 17 g packet Commonly known as: MIRALAX / GLYCOLAX Take 17 g by mouth daily. Start taking on: March 09, 2021   predniSONE 10 MG tablet Commonly known as: DELTASONE Take 42m daily for 3days,Take 143mdaily for 3days, then stop   simethicone 80 MG chewable tablet Commonly known as: MYLICON Chew 1 tablet (80 mg total) by mouth 4 (four) times daily.        Discharge Exam: Filed Weights   02/25/21 0500 02/27/21 0511 02/28/21 0500  Weight: 111 kg 110.7 kg 111.6 kg   Vitals:   03/08/21 0420 03/08/21 0804  BP: (!) 134/49 (!) 153/47  Pulse: (!)  50 (!) 50  Resp: 15 18  Temp: 98.4 F (36.9 C) 98.1 F (36.7 C)  SpO2: 100% 100%   General: Appear in no distress, no Rash; Oral Mucosa Clear, moist. no Abnormal Neck Mass Or lumps, Conjunctiva normal  Cardiovascular: S1 and S2 Present, no Murmur Respiratory: good respiratory effort, Bilateral Air entry present and CTA, no Crackles, no wheezes Abdomen: Bowel Sound present, Soft and no tenderness Extremities: no Pedal edema Neurology: alert and oriented to time, place, and person affect appropriate.  Left-sided weakness, no new focal deficit  The results of significant diagnostics from this hospitalization (including imaging, microbiology, ancillary and laboratory) are listed below for reference.    Significant Diagnostic Studies: CT ANGIO HEAD NECK W WO CM  Result Date: 03/06/2021 CLINICAL DATA:  Stroke. EXAM: CT ANGIOGRAPHY HEAD AND NECK TECHNIQUE: Multidetector CT imaging of the head and neck was performed using the standard protocol during bolus administration of intravenous contrast. Multiplanar CT image reconstructions and MIPs were obtained to evaluate the vascular anatomy. Carotid stenosis measurements (when applicable) are obtained utilizing NASCET criteria, using the distal internal carotid diameter as the denominator. CONTRAST:  7590mMNIPAQUE IOHEXOL 350 MG/ML SOLN COMPARISON:  MRI head 03/06/2021.  CT head 01/04/2021 FINDINGS: CTA NECK FINDINGS Aortic arch: Aortic arch not covered on the study. Proximal great vessels patent. There is suboptimal arterial opacification on the study. Right carotid system: Atherosclerotic calcification right carotid bifurcation. Approximately 25% diameter stenosis proximal right internal carotid artery. Left carotid system: Atherosclerotic calcification left carotid bifurcation. Estimated 50% diameter stenosis proximal left internal carotid artery Vertebral arteries: Both vertebral arteries patent to the basilar. There is mild stenosis left vertebral  artery at the skull base. Skeleton: No acute abnormality. Other neck: Negative for mass or adenopathy in the neck. Upper chest: Lung apices clear bilaterally. Review of the MIP images confirms the above findings CTA HEAD FINDINGS Anterior circulation: Atherosclerotic disease and at least moderate stenosis in the cavernous carotid bilaterally. There is suboptimal intracranial arterial opacification. There is occlusion or severe stenosis right M1 segment. There is a small vessel which may be a branch vessel or severely stenotic right M1 segment. There is poor perfusion of the right MCA branches. Both anterior cerebral arteries are patent. There is disease in distal branches of the anterior cerebral artery bilaterally. There is diffuse disease throughout the left middle cerebral artery branches without large vessel occlusion. Posterior circulation: Both vertebral arteries patent to the basilar. Mild stenosis left vertebral artery at the skull  base. Basilar patent. Diffusely disease posterior cerebral arteries without occlusion. Venous sinuses: Normal venous enhancement Anatomic variants: None Hypodensity in the right external capsule which showed blood products on CT MRI. This is most likely late subacute blood. Hypodensity in the right frontal parietal lobe and insula compatible with subacute infarct as noted on MRI. Review of the MIP images confirms the above findings IMPRESSION: 1. Suboptimal CT angiogram. Aortic arch not included. There is suboptimal arterial opacification. 2. Atherosclerotic calcification of the carotid bifurcation bilaterally. Estimated 25% diameter stenosis on the right and 50% diameter stenosis on the left. 3. Occlusion versus severe stenosis right M1 segment. There is a small vessel in this area which could be a branch vessel or severely stenotic right M1 segment. There is poor perfusion of right MCA branches consistent with MCA infarct. 4. Diffuse intracranial atherosclerotic disease.  Electronically Signed   By: Franchot Gallo M.D.   On: 03/06/2021 18:30   MR BRAIN WO CONTRAST  Result Date: 03/06/2021 CLINICAL DATA:  Stroke follow-up. EXAM: MRI HEAD WITHOUT CONTRAST TECHNIQUE: Multiplanar, multiecho pulse sequences of the brain and surrounding structures were obtained without intravenous contrast. COMPARISON:  MRI head 12/27/2020.  CT head 01/04/2021 FINDINGS: Brain: Interval development of cortical hyperintensity and cortical volume loss in the right MCA territory. Findings compatible with right MCA infarct which has occurred since the prior studies. Mild associated hemorrhage is present in the infarct cortex. In addition, there is some hemosiderin staining over the convexity due to subarachnoid hemorrhage. No residual restricted diffusion in this area. There is hematoma in the right basal ganglia likely in the external capsule which was not present previously. Moderate amount of blood is present. This was not present on the prior CT or MRI. Ventricle size normal. Chronic infarct left occipital parietal lobe. Vascular: Normal arterial flow voids at the skull base. There is attenuated flow void in the right M1 and M2 branches which may be occluded. Skull and upper cervical spine: No focal skeletal lesion. Sinuses/Orbits: Retention cyst right maxillary sinus. Negative orbit Other: None IMPRESSION: Interval development of large territory right MCA infarct without restricted diffusion. This is most likely a late subacute infarct. There is probable high-grade stenosis or occlusion of the right middle cerebral artery. Interval development of hemorrhage in the right external capsule which likely is also subacute. These results were called by telephone at the time of interpretation on 03/06/2021 at 4:39 pm to provider [facility] Rachna Schonberger , who verbally acknowledged these results. Electronically Signed   By: Franchot Gallo M.D.   On: 03/06/2021 16:39   DG Chest Port 1 View  Result Date:  02/07/2021 CLINICAL DATA:  Fever EXAM: PORTABLE CHEST 1 VIEW COMPARISON:  02/02/2021 FINDINGS: UPPER limits normal heart size and tracheostomy tube again noted. Peribronchial thickening is again identified. There is no evidence of focal airspace disease, pulmonary edema, suspicious pulmonary nodule/mass, pleural effusion, or pneumothorax. No acute bony abnormalities are identified. IMPRESSION: No active disease. Electronically Signed   By: Margarette Canada M.D.   On: 02/07/2021 16:48   DG Abd 2 Views  Result Date: 03/07/2021 CLINICAL DATA:  Right side abdominal pain. EXAM: ABDOMEN - 2 VIEW COMPARISON:  Plain film of the abdomen 02/06/2021. FINDINGS: Peg tube is in place. No free intraperitoneal air or evidence of small-bowel obstruction is identified. There is a large volume of stool and gas throughout the colon. No abnormal abdominal calcification or acute bony abnormality. IMPRESSION: No acute finding. Large volume of gas and stool throughout the colon. Electronically  Signed   By: Inge Rise M.D.   On: 03/07/2021 17:50   DG Swallowing Func-Speech Pathology  Result Date: 02/08/2021 Formatting of this result is different from the original. Objective Swallowing Evaluation: Type of Study: MBS-Modified Barium Swallow Study  Patient Details Name: Paden Senger MRN: [id number] Date of Birth: 22-Dec-1960 Today's Date: 02/08/2021 Time: SLP Start Time (ACUTE ONLY): 1410 -SLP Stop Time (ACUTE ONLY): 1440 SLP Time Calculation (min) (ACUTE ONLY): 30 min Past Medical History: Past Medical History: Diagnosis Date  Diabetes mellitus type II, non insulin dependent ([place])   Essential hypertension [name]  Heart failure ([place])   Unknown details.  By report no cardiac surgery  Venous stasis dermatitis of left lower extremity  Past Surgical History: Past Surgical History: Procedure Laterality Date  CARDIAC CATHETERIZATION    LEFT HEART CATH AND CORONARY ANGIOGRAPHY N/A 12/25/2020  Procedure: LEFT HEART CATH AND CORONARY  ANGIOGRAPHY;  Surgeon: Leonie Man, MD;  Location: [place] [facility];  Service: Cardiovascular;  Laterality: N/A;  PEG PLACEMENT N/A 01/11/2021  Procedure: PERCUTANEOUS ENDOSCOPIC GASTROSTOMY (PEG) PLACEMENT;  Surgeon: Lucilla Lame, MD;  Location: [facility] ENDOSCOPY;  Service: Endoscopy;  Laterality: N/A;  RIGHT HEART CATH N/A 12/25/2020  Procedure: RIGHT HEART CATH;  Surgeon: Leonie Man, MD;  Location: [place] [facility];  Service: Cardiovascular;  Laterality: N/A;  TRACHEOSTOMY TUBE PLACEMENT N/A 01/12/2021  Procedure: TRACHEOSTOMY;  Surgeon: Carloyn Manner, MD;  Location: [facility] ORS;  Service: ENT;  Laterality: N/A;  Unknown   HPI: Pt is a 60 yo male that presented from home with respiratory distress 12/25/2020. Underwent defibrillation and CPR, emergently intubated. During admission pt has had a cardiac cath, peg tube and tracheostomy placed, weaned from vent 01/13/2021. 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 Mildly restricted diffusion involving the cortex more diffusely over the right cerebral convexity favored to reflect an acute MCA territory infarct over global hypoxic injury given unilaterality".  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 Pt currently has a #6 cuffless Shiley with PMSV worn during all waking hours.  Subjective: Rancho Level IV - very talkative Assessment / Plan / Recommendation CHL IP CLINICAL IMPRESSIONS 02/08/2021 Clinical Impression Pt presents with moderate oral phase and mild pharyngeal phase dysphagia. Pt's oral phase deficits appear to be multifactorial related to current cognitive status as well as left oral motor weakness. He is currently a Rancho Level IV and has difficulty following specific oral motor directions. Additionally, he has a #6 cuffless Shiley with PMSV in place.  Functionally he has left facial weakness and likely decreased lingual strength/ROM. Orally, when consuming thin liquids (via spoon, cup, straw) and nectar thick liquids (via spoon and cup), pt has gross left anterior spillage, reduced bolus cohesion resulting in premature spillage and lingual palatal residue.  He had occasional piecemeal swallowing with thin liquids via cup. Pt's oral phase was appropriate when consuming puree and mildly prolonged with dysphagia 2 textures. He has multiple swallows to clear oral cavity of dysphagia 2 items. Pt's pharyngeal phase appears sensory related as his swallow is initiated at the pyriform sinuses with penetration of thin liquids when consuming multiple swallows via straw. Penetrates were expelled during swallow with exception x 1 when residue remained in laryngeal vestibule (PAS 3). Of note, pt has been NPO for 46 days and his oropharyngeal abilities improved as  study progressed and ongoing use of swallow musculature. At this time, recommend a conservative diet of dysphagia 1 with thin liquids via cup with trials of dysphagia 2 at bedside with SLP. While pt is able to consume medicine crushed in puree, recommend continued administration via alternative means at this time. Pt requires total assistance with self-feeding and should have FULL NURSING SUPERVISION during all PO intake. SLP Visit Diagnosis Dysphagia, oropharyngeal phase (R13.12) Attention and concentration deficit following -- Frontal lobe and executive function deficit following -- Impact on safety and function Mild aspiration risk   CHL IP TREATMENT RECOMMENDATION 02/08/2021 Treatment Recommendations Therapy as outlined in treatment plan below   Prognosis 02/08/2021 Prognosis for Safe Diet Advancement Good Barriers to Reach Goals Cognitive deficits Barriers/Prognosis Comment -- CHL IP DIET RECOMMENDATION 02/08/2021 SLP Diet Recommendations Dysphagia 1 (Puree) solids;Thin liquid Liquid Administration via Cup  Medication Administration Via alternative means Compensations Minimize environmental distractions;Slow rate;Small sips/bites Postural Changes Seated upright at 90 degrees   CHL IP OTHER RECOMMENDATIONS 02/08/2021 Recommended Consults -- Oral Care Recommendations Oral care BID Other Recommendations Have oral suction available   CHL IP FOLLOW UP RECOMMENDATIONS 02/08/2021 Follow up Recommendations Inpatient Rehab   CHL IP FREQUENCY AND DURATION 02/02/2021 Speech Therapy Frequency (ACUTE ONLY) min 2x/week Treatment Duration 4 weeks      CHL IP ORAL PHASE 02/08/2021 Oral Phase Impaired Oral - Pudding Teaspoon -- Oral - Pudding Cup -- Oral - Honey Teaspoon -- Oral - Honey Cup -- Oral - Nectar Teaspoon Left anterior bolus loss;Lingual/palatal residue;Premature spillage Oral - Nectar Cup Left anterior bolus loss;Premature spillage;Lingual/palatal residue Oral - Nectar Straw -- Oral - Thin Teaspoon Left anterior bolus loss;Lingual/palatal residue;Premature spillage Oral - Thin Cup Left anterior bolus loss;Weak lingual manipulation;Lingual/palatal residue;Piecemeal swallowing;Decreased bolus cohesion;Premature spillage Oral - Thin Straw Left anterior bolus loss;Lingual/palatal residue;Premature spillage;Decreased bolus cohesion Oral - Puree Lingual/palatal residue;Left anterior bolus loss Oral - Mech Soft Weak lingual manipulation;Left anterior bolus loss;Lingual/palatal residue;Impaired mastication;Reduced posterior propulsion Oral - Regular -- Oral - Multi-Consistency -- Oral - Pill -- Oral Phase - Comment --  CHL IP PHARYNGEAL PHASE 02/08/2021 Pharyngeal Phase Impaired Pharyngeal- Pudding Teaspoon -- Pharyngeal -- Pharyngeal- Pudding Cup -- Pharyngeal -- Pharyngeal- Honey Teaspoon -- Pharyngeal -- Pharyngeal- Honey Cup -- Pharyngeal -- Pharyngeal- Nectar Teaspoon Delayed swallow initiation-pyriform sinuses Pharyngeal -- Pharyngeal- Nectar Cup Delayed swallow initiation-vallecula Pharyngeal -- Pharyngeal- Nectar Straw --  Pharyngeal -- Pharyngeal- Thin Teaspoon Delayed swallow initiation-pyriform sinuses Pharyngeal -- Pharyngeal- Thin Cup Delayed swallow initiation-pyriform sinuses Pharyngeal -- Pharyngeal- Thin Straw Delayed swallow initiation-pyriform sinuses;Penetration/Aspiration during swallow Pharyngeal Material enters airway, remains ABOVE vocal cords then ejected out;Material enters airway, remains ABOVE vocal cords and not ejected out Pharyngeal- Puree WFL Pharyngeal -- Pharyngeal- Mechanical Soft WFL Pharyngeal -- Pharyngeal- Regular -- Pharyngeal -- Pharyngeal- Multi-consistency -- Pharyngeal -- Pharyngeal- Pill -- Pharyngeal -- Pharyngeal Comment --  CHL IP CERVICAL ESOPHAGEAL PHASE 02/08/2021 Cervical Esophageal Phase WFL Pudding Teaspoon -- Pudding Cup -- Honey Teaspoon -- Honey Cup -- Nectar Teaspoon -- Nectar Cup -- Nectar Straw -- Thin Teaspoon -- Thin Cup -- Thin Straw -- Puree -- Mechanical Soft -- Regular -- Multi-consistency -- Pill -- Cervical Esophageal Comment -- Happi Overton 02/08/2021, 9:09 PM              ECHOCARDIOGRAM COMPLETE  Result Date: 03/07/2021    ECHOCARDIOGRAM REPORT   Patient Name:   Jeremy Browning Date of Exam: 03/07/2021 Medical Rec #:  [id number]      Height:  72.0 in Accession #:    [id number]     Weight:       246.0 lb Date of Birth:  [birth year]       BSA:          2.327 m Patient Age:    63 years       BP:           139/46 mmHg Patient Gender: M              HR:           62 bpm. Exam Location:  [facility] Procedure: 2D Echo, Color Doppler and Cardiac Doppler Indications:     I63.9 Stroke  History:         Patient has prior history of Echocardiogram examinations, most                  recent 12/27/2020. Risk Factors:Hypertension and Diabetes.  Sonographer:     Charmayne Sheer RDCS (AE) Referring Phys:  [id number] ASHISH ARORA Diagnosing Phys: Serafina Royals MD  Sonographer Comments: Suboptimal apical window and no subcostal window. IMPRESSIONS  1. Left ventricular ejection fraction, by estimation, is  60 to 65%. The left ventricle has normal function. The left ventricle has no regional wall motion abnormalities. Left ventricular diastolic parameters were normal.  2. Right ventricular systolic function is normal. The right ventricular size is normal.  3. The mitral valve is normal in structure. Mild mitral valve regurgitation.  4. The aortic valve is normal in structure. Aortic valve regurgitation is mild. FINDINGS  Left Ventricle: Left ventricular ejection fraction, by estimation, is 60 to 65%. The left ventricle has normal function. The left ventricle has no regional wall motion abnormalities. The left ventricular internal cavity size was normal in size. There is  no left ventricular hypertrophy. Left ventricular diastolic parameters were normal. Right Ventricle: The right ventricular size is normal. No increase in right ventricular wall thickness. Right ventricular systolic function is normal. Left Atrium: Left atrial size was normal in size. Right Atrium: Right atrial size was normal in size. Pericardium: There is no evidence of pericardial effusion. Mitral Valve: The mitral valve is normal in structure. Mild mitral valve regurgitation. MV peak gradient, 2.4 mmHg. The mean mitral valve gradient is 1.0 mmHg. Tricuspid Valve: The tricuspid valve is normal in structure. Tricuspid valve regurgitation is trivial. Aortic Valve: The aortic valve is normal in structure. Aortic valve regurgitation is mild. Aortic valve mean gradient measures 5.0 mmHg. Aortic valve peak gradient measures 10.0 mmHg. Aortic valve area, by VTI measures 1.91 cm. Pulmonic Valve: The pulmonic valve was normal in structure. Pulmonic valve regurgitation is not visualized. Aorta: The aortic root and ascending aorta are structurally normal, with no evidence of dilitation. IAS/Shunts: No atrial level shunt detected by color flow Doppler.  LEFT VENTRICLE PLAX 2D LVIDd:         5.80 cm  Diastology LVIDs:         3.50 cm  LV e' medial:    7.07 cm/s  LV PW:         1.30 cm  LV E/e' medial:  9.3 LV IVS:        0.90 cm  LV e' lateral:   8.05 cm/s LVOT diam:     1.80 cm  LV E/e' lateral: 8.2 LV SV:         49 LV SV Index:   21 LVOT Area:     2.54 cm  RIGHT VENTRICLE RV Basal  diam:  3.70 cm LEFT ATRIUM             Index       RIGHT ATRIUM           Index LA diam:        3.50 cm 1.50 cm/m  RA Area:     15.90 cm LA Vol (A2C):   55.1 ml 23.68 ml/m RA Volume:   41.70 ml  17.92 ml/m LA Vol (A4C):   52.2 ml 22.44 ml/m LA Biplane Vol: 57.1 ml 24.54 ml/m  AORTIC VALVE                    PULMONIC VALVE AV Area (Vmax):    1.74 cm     PV Vmax:       1.84 m/s AV Area (Vmean):   1.89 cm     PV Vmean:      115.000 cm/s AV Area (VTI):     1.91 cm     PV VTI:        0.293 m AV Vmax:           158.00 cm/s  PV Peak grad:  13.5 mmHg AV Vmean:          105.000 cm/s PV Mean grad:  6.0 mmHg AV VTI:            0.256 m AV Peak Grad:      10.0 mmHg AV Mean Grad:      5.0 mmHg LVOT Vmax:         108.00 cm/s LVOT Vmean:        78.100 cm/s LVOT VTI:          0.192 m LVOT/AV VTI ratio: 0.75  AORTA Ao Root diam: 3.00 cm MITRAL VALVE MV Area (PHT): 4.17 cm    SHUNTS MV Area VTI:   1.98 cm    Systemic VTI:  0.19 m MV Peak grad:  2.4 mmHg    Systemic Diam: 1.80 cm MV Mean grad:  1.0 mmHg MV Vmax:       0.77 m/s MV Vmean:      49.1 cm/s MV Decel Time: 182 msec MV E velocity: 66.00 cm/s MV A velocity: 92.50 cm/s MV E/A ratio:  0.71 Serafina Royals MD Electronically signed by Serafina Royals MD Signature Date/Time: 03/07/2021/1:25:32 PM    Final     Microbiology: Recent Results (from the past 240 hour(s))  Resp Panel by RT-PCR (Flu A&B, Covid) Nasopharyngeal Swab     Status: None   Collection Time: 03/08/21 10:34 AM   Specimen: Nasopharyngeal Swab; Nasopharyngeal(NP) swabs in vial transport medium  Result Value Ref Range Status   SARS Coronavirus 2 by RT PCR NEGATIVE NEGATIVE Final    Comment: (NOTE) SARS-CoV-2 target nucleic acids are NOT DETECTED.  The SARS-CoV-2 RNA is  generally detectable in upper respiratory specimens during the acute phase of infection. The lowest concentration of SARS-CoV-2 viral copies this assay can detect is 138 copies/mL. A negative result does not preclude SARS-Cov-2 infection and should not be used as the sole basis for treatment or other patient management decisions. A negative result may occur with  improper specimen collection/handling, submission of specimen other than nasopharyngeal swab, presence of viral mutation(s) within the areas targeted by this assay, and inadequate number of viral copies(<138 copies/mL). A negative result must be combined with clinical observations, patient history, and epidemiological information. The expected result is Negative.  Fact Sheet for Patients:  EntrepreneurPulse.com.au  Fact Sheet for Healthcare Providers:  IncredibleEmployment.be  This test is no t yet approved or cleared by the [place] FDA and  has been authorized for detection and/or diagnosis of SARS-CoV-2 by FDA under an Emergency Use Authorization (EUA). This EUA will remain  in effect (meaning this test can be used) for the duration of the COVID-19 declaration under Section 564(b)(1) of the Act, 21 U.S.C.section 360bbb-3(b)(1), unless the authorization is terminated  or revoked sooner.       Influenza A by PCR NEGATIVE NEGATIVE Final   Influenza B by PCR NEGATIVE NEGATIVE Final    Comment: (NOTE) The Xpert Xpress SARS-CoV-2/FLU/RSV plus assay is intended as an aid in the diagnosis of influenza from Nasopharyngeal swab specimens and should not be used as a sole basis for treatment. Nasal washings and aspirates are unacceptable for Xpert Xpress SARS-CoV-2/FLU/RSV testing.  Fact Sheet for Patients: EntrepreneurPulse.com.au  Fact Sheet for Healthcare Providers: IncredibleEmployment.be  This test is not yet approved or cleared by the [place] FDA and has been authorized for detection and/or diagnosis of SARS-CoV-2 by FDA under an Emergency Use Authorization (EUA). This EUA will remain in effect (meaning this test can be used) for the duration of the COVID-19 declaration under Section 564(b)(1) of the Act, 21 U.S.C. section 360bbb-3(b)(1), unless the authorization is terminated or revoked.  Performed at [facility], [address] [place], [place] [address]      Labs: CBC: Recent Labs  Lab 03/03/21 0433 03/07/21 0522  WBC 13.2* 14.2*  NEUTROABS  --  8.0*  HGB 10.8* 11.2*  HCT 35.5* 36.1*  MCV 82.2 83.0  PLT 167 973   Basic Metabolic Panel: Recent Labs  Lab 03/06/21 1514 03/07/21 0522 03/08/21 0026  NA 136 136 140  K 5.6* 5.3* 5.1  CL 106 103 107  CO2 '24 26 28  ' GLUCOSE 157* 99 117*  BUN 45* 40* 41*  CREATININE 1.33* 1.27* 1.30*  CALCIUM 9.3 9.1 8.7*  MG  --  2.3  --   PHOS 4.3  --   --    Liver Function Tests: Recent Labs  Lab 03/06/21 1514 03/07/21 0522  AST  --  16  ALT  --  24  ALKPHOS  --  60  BILITOT  --  0.5  PROT  --  7.3  ALBUMIN 3.1* 3.2*   CBG: Recent Labs  Lab 03/07/21 1145 03/07/21 1658 03/07/21 2025 03/08/21 0804 03/08/21 1159  GLUCAP 106* 186* 147* 88 133*    Time spent: 35 minutes  Signed:  Berle Mull  [facility] 03/08/2021

## 2021-03-08 NOTE — Progress Notes (Signed)
Chaplain Maggie made follow up visit to say farewell to patient. He expressed concerns about not having clothing or shoes to change into before transporting to a new facility. Patient also noted, "it's scary" because he's moving to an unknown place. Chaplain offered encouragement and helped gather patient's art supplies into patient belonging bags. Prayer of blessing was shared with gratitude.

## 2021-03-08 NOTE — TOC Transition Note (Signed)
Transition of Care [facility]) - CM/SW Discharge Note   Patient Details  Name: Jeremy Browning MRN: [id number] Date of Birth: Mar 19, 1961  Transition of Care [facility]) CM/SW Contact:  Maree Krabbe, LCSW Phone Number: 03/08/2021, 2:01 PM   Clinical Narrative:   Clinical Social Worker facilitated patient discharge including contacting patient family and facility to confirm patient discharge plans.  Clinical information faxed to facility and family agreeable with plan.  CSW arranged ambulance transport via [facility] to [facility] (scheduled for 4:00 PM Pick up) .  RN to call [phone] for report prior to discharge.  Final next level of care: Skilled Nursing Facility Barriers to Discharge: No Barriers Identified   Patient Goals and CMS Choice        Discharge Placement              Patient chooses bed at:  [facility]) Patient to be transferred to facility by: [facility] Name of family member notified: Daughter Patient and family notified of of transfer: 03/08/21  Discharge Plan and Services In-house Referral: Clinical Social Work   Post Acute Care Choice: Skilled Nursing Facility                               Social Determinants of Health (SDOH) Interventions     Readmission Risk Interventions No flowsheet data found.

## 2021-03-08 NOTE — TOC Progression Note (Signed)
Transition of Care [facility]) - Progression Note    Patient Details  Name: Jamorian Dimaria MRN: [id number] Date of Birth: Feb 04, 1961  Transition of Care [facility]) CM/SW Contact  Maree Krabbe, LCSW Phone Number: 03/08/2021, 9:45 AM  Clinical Narrative: Received insurance auth. Pt will dc to [facility] today. Notified MD that we will need a updated Covid test.      Expected Discharge Plan: Skilled Nursing Facility Barriers to Discharge: Continued Medical Work up  Expected Discharge Plan and Services Expected Discharge Plan: Skilled Nursing Facility In-house Referral: Clinical Social Work   Post Acute Care Choice: Skilled Nursing Facility                                         Social Determinants of Health (SDOH) Interventions    Readmission Risk Interventions No flowsheet data found.

## 2021-03-10 ENCOUNTER — Encounter: Payer: Self-pay | Admitting: *Deleted

## 2021-03-29 NOTE — Progress Notes (Deleted)
   Patient ID: Jeremy Browning, male    DOB: [birth year], 60 y.o.   MRN: [id number]  HPI  Jeremy Browning is a 60 y/o male with a history of   Echo report from 03/07/21 reviewed and showed an EF of 60-65% along with mild Jeremy but no LVH/LAE.   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 LV End Diastolic Pressure and Pulmonary Capillary Wedge Pressure are both severely elevated. There is moderate left ventricular systolic dysfunction. The left ventricular ejection fraction is 35-45% by visual estimate. Respiratory acidosis with pH of 7.0 and PCO2 of 93.  Admitted 12/25/20 due to worsening shortness of breath. Placed on cpap. Cardiac arrest developed and he was shocked along with CPR with ROSC. Emergently intubated. Cardiology, orthopaedics, GI, ENT, neurology, nephrology and palliative care consults obtained. Hospital course complicated by persistent vent dependent respiratory failure eventually requiring tracheostomy, anoxic encephalopathy. PEG tube placed. Patient's mental status started to improve with toleration of decannulation & able to eat on his own. Given antibiotics and IV lasix with transition to oral medications. Required pressors while in ICU. MRI brain show right MCA infarct. 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 Losartan stopped due to hyperkalemia and renal dysfunction.      Discharged to SNF after 73 days.   He presents today for his initial visit with a chief complaint of   Review of Systems    Physical Exam    Assessment & Plan:  1: Chronic heart failure with preserved ejection fraction without structural changes- - NYHA class - BNP 01/04/21 was 25.5  2: HTN- - BP - seeing PCP at facility - [facility] 03/08/21 reviewed and showed sodium 140, potassium 5.1, creatinine 1.3 and GFR >60  3: DM- - A1c 02/26/21 was 7.1%  4: History of CVA-

## 2021-03-30 ENCOUNTER — Ambulatory Visit: Payer: Medicaid Other | Admitting: Family

## 2021-04-08 ENCOUNTER — Emergency Department: Payer: Medicaid Other

## 2021-04-08 ENCOUNTER — Emergency Department
Admission: EM | Admit: 2021-04-08 | Discharge: 2021-04-08 | Disposition: A | Discharge status: Home / Self Care | Payer: Medicaid Other | Attending: Emergency Medicine | Admitting: Emergency Medicine

## 2021-04-08 ENCOUNTER — Other Ambulatory Visit: Payer: Self-pay

## 2021-04-08 DIAGNOSIS — Z79899 Other long term (current) drug therapy: Secondary | ICD-10-CM | POA: Diagnosis not present

## 2021-04-08 DIAGNOSIS — E119 Type 2 diabetes mellitus without complications: Secondary | ICD-10-CM | POA: Insufficient documentation

## 2021-04-08 DIAGNOSIS — I110 Hypertensive heart disease with heart failure: Secondary | ICD-10-CM | POA: Insufficient documentation

## 2021-04-08 DIAGNOSIS — I5033 Acute on chronic diastolic (congestive) heart failure: Secondary | ICD-10-CM | POA: Insufficient documentation

## 2021-04-08 DIAGNOSIS — Z7982 Long term (current) use of aspirin: Secondary | ICD-10-CM | POA: Insufficient documentation

## 2021-04-08 DIAGNOSIS — R569 Unspecified convulsions: Secondary | ICD-10-CM | POA: Diagnosis not present

## 2021-04-08 LAB — CBC
HCT: 38.4 % — ABNORMAL LOW (ref 39.0–52.0)
Hemoglobin: 11.9 g/dL — ABNORMAL LOW (ref 13.0–17.0)
MCH: 24.8 pg — ABNORMAL LOW (ref 26.0–34.0)
MCHC: 31 g/dL (ref 30.0–36.0)
MCV: 80.2 fL (ref 80.0–100.0)
Platelets: 209 10*3/uL (ref 150–400)
RBC: 4.79 MIL/uL (ref 4.22–5.81)
RDW: 15.6 % — ABNORMAL HIGH (ref 11.5–15.5)
WBC: 8 10*3/uL (ref 4.0–10.5)
nRBC: 0 % (ref 0.0–0.2)

## 2021-04-08 LAB — COMPREHENSIVE METABOLIC PANEL
ALT: 22 U/L (ref 0–44)
AST: 35 U/L (ref 15–41)
Albumin: 3.1 g/dL — ABNORMAL LOW (ref 3.5–5.0)
Alkaline Phosphatase: 83 U/L (ref 38–126)
Anion gap: 19 — ABNORMAL HIGH (ref 5–15)
BUN: 11 mg/dL (ref 8–23)
CO2: 16 mmol/L — ABNORMAL LOW (ref 22–32)
Calcium: 8.8 mg/dL — ABNORMAL LOW (ref 8.9–10.3)
Chloride: 105 mmol/L (ref 98–111)
Creatinine, Ser: 1.47 mg/dL — ABNORMAL HIGH (ref 0.61–1.24)
GFR, Estimated: 54 mL/min — ABNORMAL LOW (ref >60)
Glucose, Bld: 136 mg/dL — ABNORMAL HIGH (ref 70–99)
Potassium: 3.8 mmol/L (ref 3.5–5.1)
Sodium: 140 mmol/L (ref 135–145)
Total Bilirubin: 0.5 mg/dL (ref 0.3–1.2)
Total Protein: 6.8 g/dL (ref 6.5–8.1)

## 2021-04-08 IMAGING — CR DG CHEST 1V PORT
1 series · 1 of 1 positions shown · non-contrast
Comparison: Chest x-ray dated [DATE].

CLINICAL DATA: Seizure.

EXAM:
PORTABLE CHEST 1 VIEW

[chest ap]
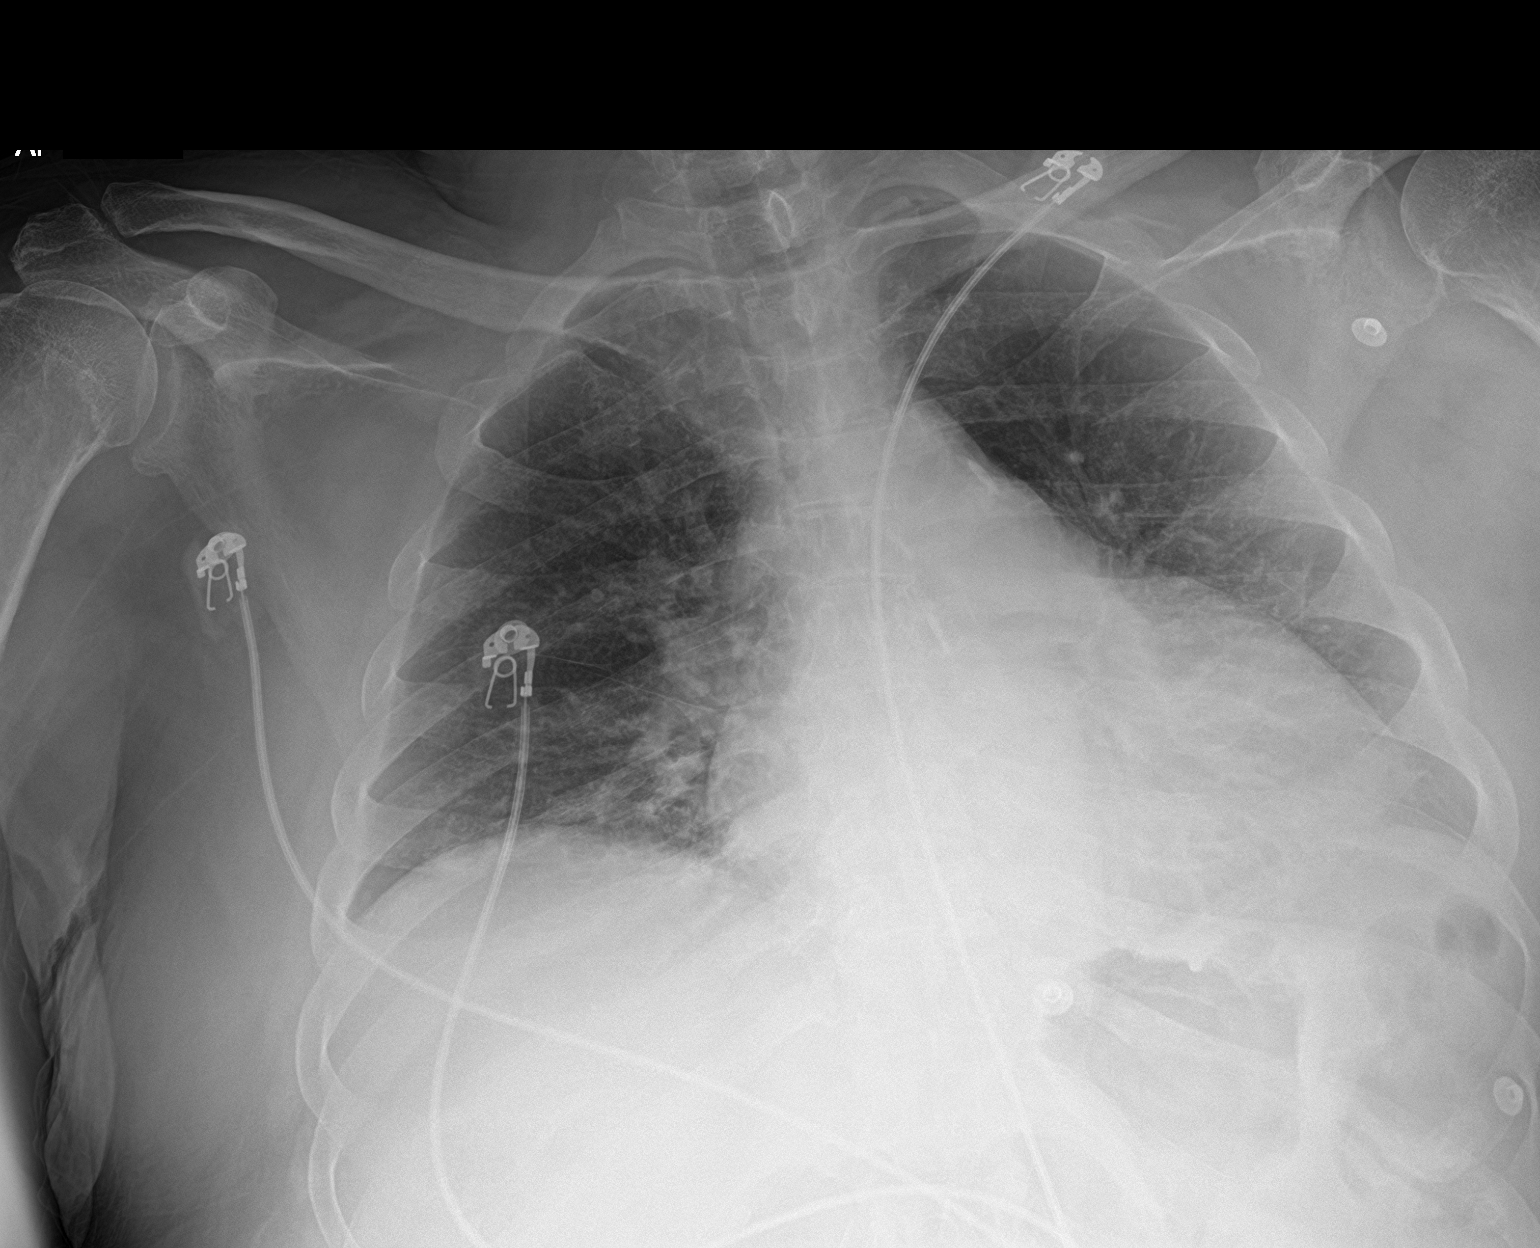

[1 of 1 positions shown; findings below may reference images not displayed]

FINDINGS: Unchanged mild cardiomegaly. Normal pulmonary vascularity. Low lung
volumes with mild bibasilar atelectasis. No focal consolidation,
pleural effusion, or pneumothorax. No acute osseous abnormality.
IMPRESSION: 1. Mild bibasilar atelectasis.

## 2021-04-08 IMAGING — CT CT HEAD W/O CM
4 series · 16 of 47 positions shown, 18 images · non-contrast
Comparison: [DATE]

CLINICAL DATA: Nontraumatic seizure

EXAM:
CT HEAD WITHOUT CONTRAST
TECHNIQUE: Contiguous axial images were obtained from the base of the skull
through the vertex without intravenous contrast.

[Series 2: head bone · axial · 0.43mm/px · z∈[-143,-113]mm · 3 of 77 slices shown]
[im 8/77  bone]
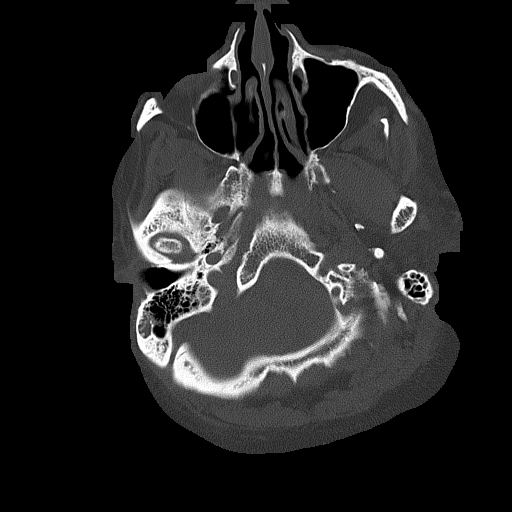
[im 16/77  bone]
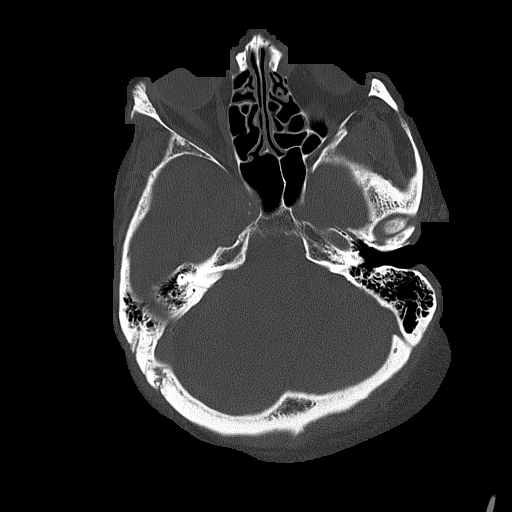
[im 23/77  bone]
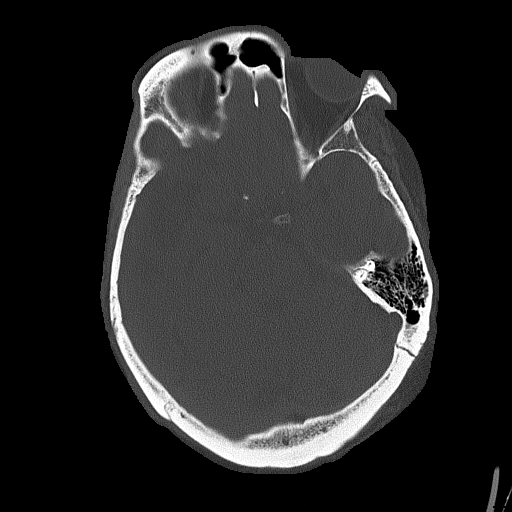

[Series 3: head wo · axial · 0.43mm/px · z∈[-142,-27]mm · 7 of 31 slices shown, 9 images]
[im 4/31  brain]
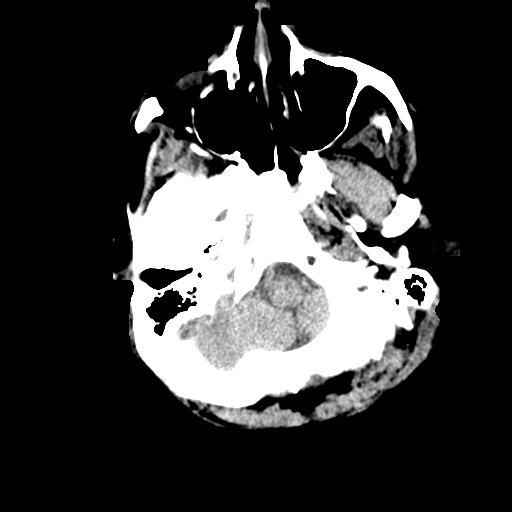
[im 4/31  bone]
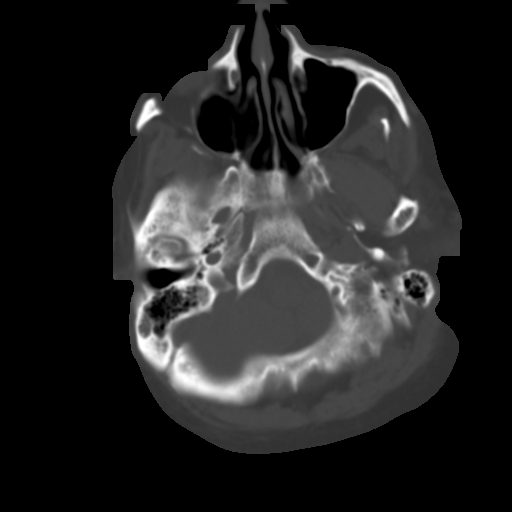
[im 8/31  brain]
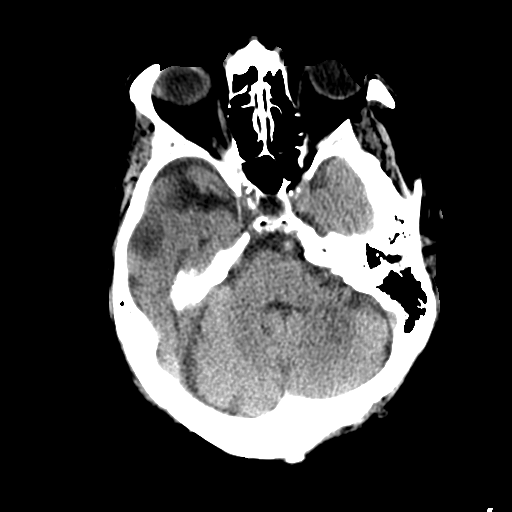
[im 12/31  brain]
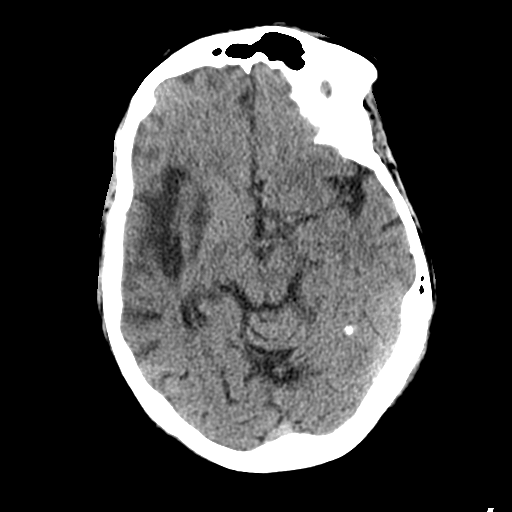
[im 16/31  brain]
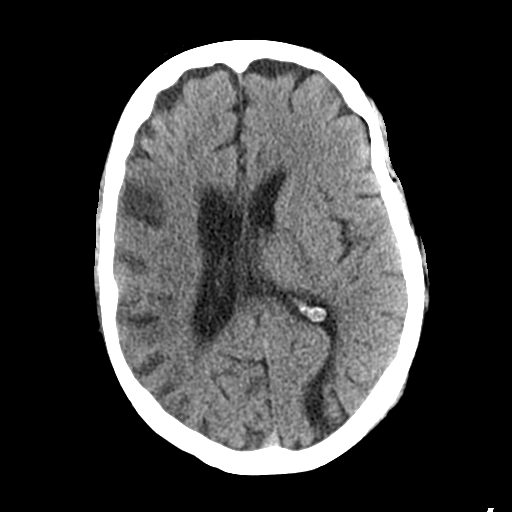
[im 19/31  brain]
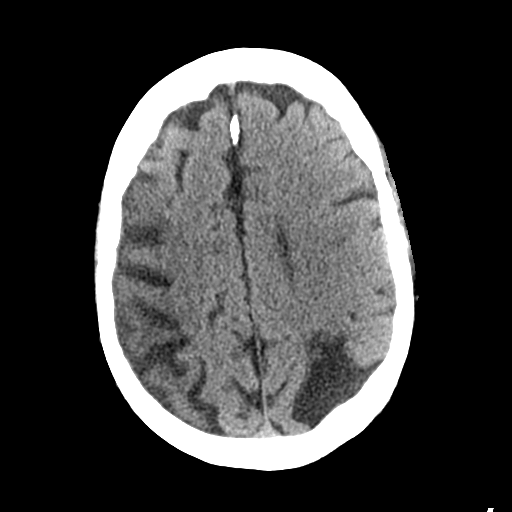
[im 19/31  bone]
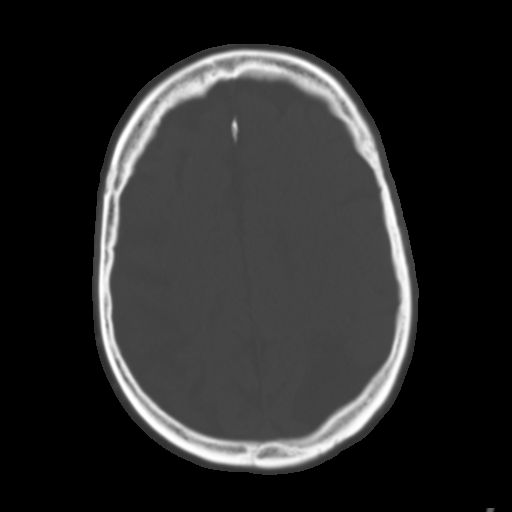
[im 23/31  brain]
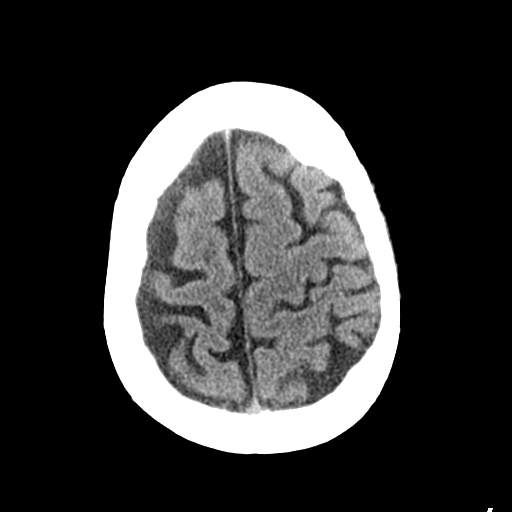
[im 27/31  brain]
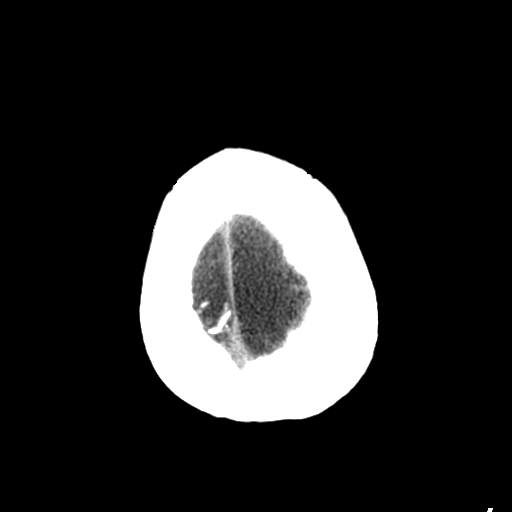

[Series 4: coronal soft tissue · coronal · 0.31mm/px · 3 of 75 slices shown]
[im 25/75  brain]
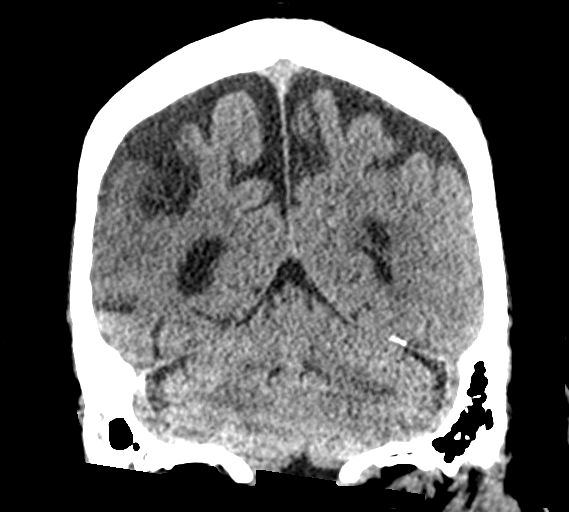
[im 33/75  brain]
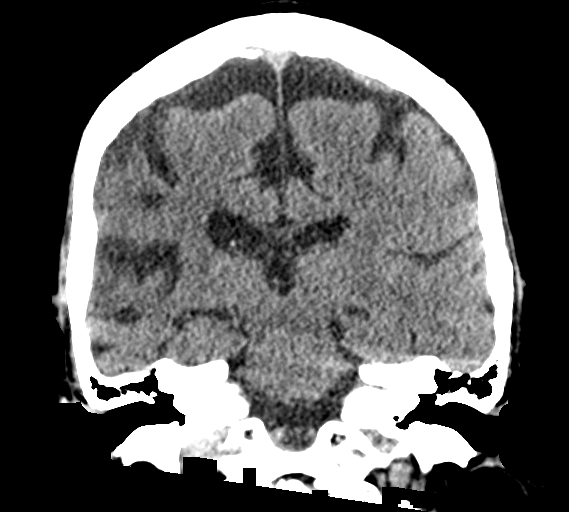
[im 42/75  brain]
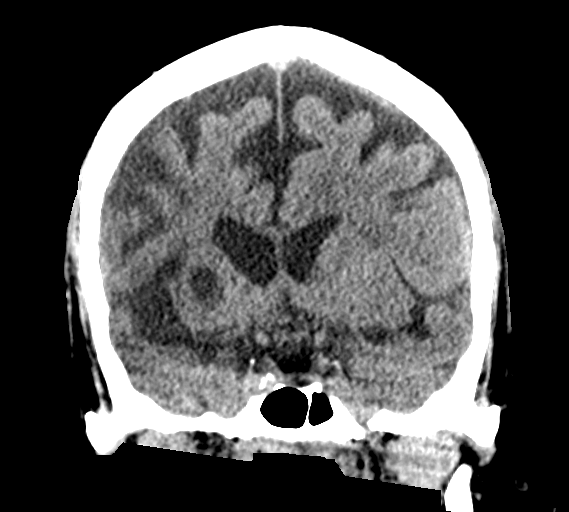

[Series 5: sagittal soft tissue · sagittal · 0.33mm/px · 3 of 57 slices shown]
[im 21/57  brain]
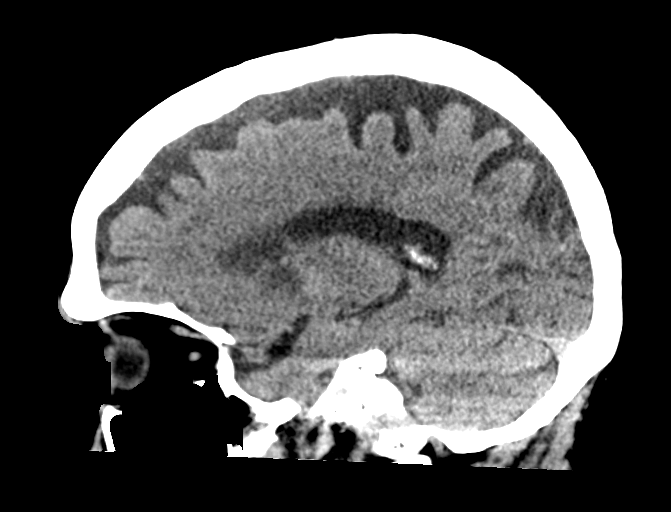
[im 28/57  brain]
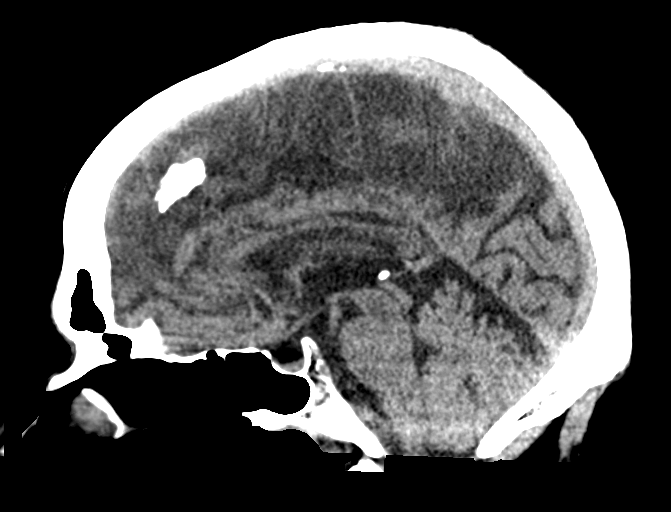
[im 35/57  brain]
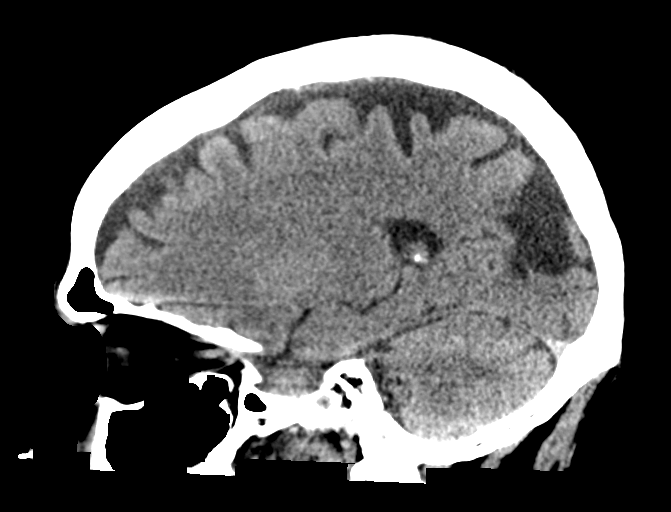

[16 of 47 positions shown; findings below may reference images not displayed]

FINDINGS: Brain: Remote right MCA territory infarction with densest
encephalomalacia at the level of the basal ganglia. Remote left
parietooccipital infarction which is moderate to large. No acute
hemorrhage, hydrocephalus, or visible acute infarct.

Vascular: No hyperdense vessel or unexpected calcification.

Skull: No acute finding

Sinuses/Orbits: Retention cyst in the right maxillary sinus which is
partially covered. Gaze to the left, nonspecific.
IMPRESSION: 1. No acute finding.
2. Large remote cerebral infarcts.

## 2021-04-08 MED ORDER — LEVETIRACETAM IN NACL 1500 MG/100ML IV SOLN
1500.0000 mg | Freq: Once | INTRAVENOUS | Status: AC
  Administered 2021-04-08: 1500 mg via INTRAVENOUS
  Filled 2021-04-08: qty 100

## 2021-04-08 MED ORDER — LEVETIRACETAM 500 MG PO TABS: 500.0000 mg | ORAL_TABLET | Freq: Two times a day (BID) | ORAL | 2 refills | Status: DC

## 2021-04-08 NOTE — ED Provider Notes (Signed)
[facility] Emergency Department Provider Note   ____________________________________________    I have reviewed the triage vital signs and the nursing notes.   HISTORY  Chief Complaint Seizures     HPI Jeremy Browning is a 60 y.o. male history of diabetes, cardiopulmonary arrest, CVA 1 month ago who presents after reported generalized tonic-clonic seizures with postictal period.  He is improved upon our exam and appears to be at his baseline.  No new focal deficits.  No reported history of seizures.  Review of records and straight the patient was diagnosed with subacute large CVA approximately 1 month ago during hospitalization.  Past Medical History:  Diagnosis Date   Diabetes mellitus type II, non insulin dependent (HCC)    Essential hypertension [name]   Heart failure (HCC)    Unknown details.  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   Essential hypertension 02/16/2021   Pressure injury of skin 02/10/2021   Oropharyngeal dysphagia    SOB (shortness of breath)    Severe hypoxic-ischemic encephalopathy    HFrEF (heart failure with reduced ejection fraction) (HCC)    Cardiogenic shock (HCC) 12/25/2020   Cardiopulmonary arrest with successful resuscitation (HCC) 12/25/2020   Acute renal failure (HCC) 12/25/2020   Transaminitis 12/25/2020   Second degree heart block 12/25/2020   Lactic acidosis 12/25/2020   Aspiration pneumonia (HCC) 12/25/2020   Cardiac arrest with successful resuscitation (HCC)    Flash pulmonary edema (HCC)    Acute respiratory failure with hypoxia and hypercapnia (HCC)     Past Surgical History:  Procedure Laterality Date   CARDIAC CATHETERIZATION     LEFT HEART CATH AND  CORONARY ANGIOGRAPHY N/A 12/25/2020   Procedure: LEFT HEART CATH AND CORONARY ANGIOGRAPHY;  Surgeon: Marykay Lex, MD;  Location: [facility] INVASIVE CV LAB;  Service: Cardiovascular;  Laterality: N/A;   PEG PLACEMENT N/A 01/11/2021   Procedure: PERCUTANEOUS ENDOSCOPIC GASTROSTOMY (PEG) PLACEMENT;  Surgeon: Midge Minium, MD;  Location: [facility] ENDOSCOPY;  Service: Endoscopy;  Laterality: N/A;   RIGHT HEART CATH N/A 12/25/2020   Procedure: RIGHT HEART CATH;  Surgeon: Marykay Lex, MD;  Location: [facility] INVASIVE CV LAB;  Service: Cardiovascular;  Laterality: N/A;   TRACHEOSTOMY TUBE PLACEMENT N/A 01/12/2021   Procedure: TRACHEOSTOMY;  Surgeon: Bud Face, MD;  Location: [facility] ORS;  Service: ENT;  Laterality: N/A;   Unknown      Prior to Admission medications   Medication Sig Start Date End Date Taking? Authorizing Provider  levETIRAcetam (KEPPRA) 500 MG tablet Take 1 tablet (500 mg total) by mouth 2 (two) times daily. 04/08/21 05/08/21 Yes Jene Every, MD  amLODipine (NORVASC) 2.5 MG tablet Take 1 tablet (2.5 mg total) by mouth daily. 03/08/21 03/08/22  Rolly Salter, MD  aspirin EC 81 MG tablet Take 81 mg by mouth daily. Swallow whole.    [provider]  atorvastatin (LIPITOR) 80 MG tablet Take 80 mg by mouth daily.    [provider]  carvedilol (COREG) 6.25 MG tablet Take 1 tablet (6.25 mg total) by mouth 2 (two) times daily with a meal. 03/08/21   Rolly Salter, MD  diclofenac Sodium (VOLTAREN) 1 % GEL Apply 2 g topically 4 (four) times daily. 03/08/21  Rolly Salter, MD  docusate sodium (COLACE) 100 MG capsule Take 1 capsule (100 mg total) by mouth 2 (two) times daily. 03/08/21   Rolly Salter, MD  famotidine (PEPCID) 20 MG tablet Take 1 tablet (20 mg total) by mouth daily. 03/09/21   Rolly Salter, MD  Multiple Vitamin (MULTIVITAMIN WITH MINERALS) TABS tablet Place 1 tablet into feeding tube daily. 03/09/21   Rolly Salter, MD  Nutritional Supplements (FEEDING  SUPPLEMENT, NEPRO CARB STEADY,) LIQD Take 237 mLs by mouth 3 (three) times daily between meals. 03/08/21   Rolly Salter, MD  polyethylene glycol (MIRALAX / GLYCOLAX) 17 g packet Take 17 g by mouth daily. 03/09/21   Rolly Salter, MD  predniSONE (DELTASONE) 10 MG tablet Take 20mg  daily for 3days,Take 10mg  daily for 3days, then stop 03/08/21   , MD  simethicone (MYLICON) 80 MG chewable tablet Chew 1 tablet (80 mg total) by mouth 4 (four) times daily. 03/08/21   Rolly Salter, MD  Water For Irrigation, Sterile (FREE WATER) SOLN Place 30 mLs into feeding tube every 4 (four) hours. 03/08/21   Rolly Salter, MD     Allergies Patient has no allergy information on record.  Family History  Family history unknown: Yes    Social History Social History   Tobacco Use   Smoking status: Unknown   Smokeless tobacco: Never  Vaping Use   Vaping Use: Never used    Review of Systems  Constitutional: No fever/chills Eyes: No visual changes.  ENT: No sore throat. Cardiovascular: Denies chest pain. Respiratory: Denies shortness of breath. Gastrointestinal: No abdominal pain.  Genitourinary: Negative for dysuria. Musculoskeletal: Negative for back pain. Skin: Negative for rash. Neurological: As above   ____________________________________________   PHYSICAL EXAM:  VITAL SIGNS: ED Triage Vitals  Enc Vitals Group     BP 04/08/21 0717 (!) 158/68     Pulse Rate 04/08/21 0717 84     Resp 04/08/21 0717 16     Temp 04/08/21 0717 97.9 F (36.6 C)     Temp Source 04/08/21 0717 Oral     SpO2 04/08/21 0717 99 %     Weight --      Height 04/08/21 0722 1.829 m (6')     Head Circumference --      Peak Flow --      Pain Score 04/08/21 1117 0     Pain Loc --      Pain Edu? --      Excl. in GC? --     Constitutional: Alert and oriented.  Eyes: Conjunctivae are normal.  PERRLA, EOMI  Nose: No congestion/rhinnorhea. Mouth/Throat: Mucous membranes are moist.     Cardiovascular: Normal rate, regular rhythm. Grossly normal heart sounds.  Good peripheral circulation. Respiratory: Normal respiratory effort.  No retractions. Lungs CTAB. Gastrointestinal: Soft and nontender. No distention.  No CVA tenderness.  Musculoskeletal: No lower extremity tenderness nor edema.   Neurologic:  Normal speech and language.  Left-sided weakness, chronic Skin:  Skin is warm, dry and intact. No rash noted. Psychiatric: Mood and affect are normal. Speech and behavior are normal.  ____________________________________________   LABS (all labs ordered are listed, but only abnormal results are displayed)  Labs Reviewed  CBC - Abnormal; Notable for the following components:      Result Value   Hemoglobin 11.9 (*)    HCT 38.4 (*)    MCH 24.8 (*)    RDW 15.6 (*)  All other components within normal limits  COMPREHENSIVE METABOLIC PANEL - Abnormal; Notable for the following components:   CO2 16 (*)    Glucose, Bld 136 (*)    Creatinine, Ser 1.47 (*)    Calcium 8.8 (*)    Albumin 3.1 (*)    GFR, Estimated 54 (*)    Anion gap 19 (*)    All other components within normal limits   ____________________________________________  EKG  ED ECG REPORT I, Jene Every, the attending physician, personally viewed and interpreted this ECG.  Date: 04/08/2021  Rhythm: normal sinus rhythm QRS Axis: normal Intervals: Left bundle branch block ST/T Wave abnormalities: normal Narrative Interpretation: no evidence of acute ischemia  ____________________________________________  RADIOLOGY  CT head no acute abnormalities Chest x-ray reviewed by me, no acute abnormality ____________________________________________   PROCEDURES  Procedure(s) performed: No  Procedures   Critical Care performed: No ____________________________________________   INITIAL IMPRESSION / ASSESSMENT AND PLAN / ED COURSE  Pertinent labs & imaging results that were available during my  care of the patient were reviewed by me and considered in my medical decision making (see chart for details).   Patient presents after generalized seizure as detailed above.  Loaded with IV Keppra here in the emergency department.  Lab work is overall unremarkable, CT head and chest x-ray are reassuring  Suspect seizure related to CVA 1 month ago.  Discussed with neurology who agrees with loading with Keppra and starting the patient on Keppra 500 mg twice daily.  Patient is appropriate for discharge with outpatient follow-up with neurology for EEG, return precautions discussed    ____________________________________________   FINAL CLINICAL IMPRESSION(S) / ED DIAGNOSES  Final diagnoses:  Seizure (HCC)        Note:  This document was prepared using Dragon voice recognition software and may include unintentional dictation errors.    Jene Every, MD 04/08/21 1248

## 2021-04-08 NOTE — ED Notes (Signed)
Transport here to take pt back to [facility] at this time. Report given to EMS, previously called to facilty

## 2021-04-08 NOTE — ED Notes (Signed)
Report called to [facility] at [facility] at this time. Discharge instructions including prescription and follow up reviewed.

## 2021-04-08 NOTE — Discharge Instructions (Addendum)
You had a seizure today, likely related to the stroke that you had last month.  We gave you seizure medication your IV and observed in the emergency department, our neurologist recommends that you start Keppra 500 mg twice daily and follow-up with neurology as an outpatient

## 2021-04-08 NOTE — ED Notes (Signed)
Called [facility] for transport to [facility]  informed Covid +  [phone]

## 2021-04-08 NOTE — ED Triage Notes (Addendum)
Pt to ER via [facility] from [facility] after multiple witnessed seizures this morning. Ems report at approx 6am pt had x2 seizures prior to their arrival that lasted around 30 seconds, pt was postictal on their arrival. Pt then had another tonic clonic seizure and was then post ictal for 15 minutes. No hx of falls or trauma. Pt covid positive.   Unsure of pt's mentation at baseline. Pt unable to answer triage questions. Hx of cardiac arrest, g-tube in place.   BP 103/77, axillary temp 96.6, cbg 123, HR 80, RR 23, ra sats 99%.

## 2021-04-14 ENCOUNTER — Ambulatory Visit: Payer: Medicaid Other | Admitting: Family

## 2021-04-14 ENCOUNTER — Telehealth: Payer: Self-pay | Admitting: Family

## 2021-04-14 NOTE — Telephone Encounter (Signed)
Patient did not show for his [facility] appointment on 04/14/21. Will attempt to reschedule.

## 2021-04-14 NOTE — Progress Notes (Deleted)
   Patient ID: Jeremy Browning, male    DOB: 1961/07/30, 60 y.o.   MRN: [id number]  HPI  Mr Shular is a 60 y/o male with a history of   Echo report from 03/07/21 reviewed and showed an EF of 60-65% along with mild MR but no LVH/LAE.   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 LV End Diastolic Pressure and Pulmonary Capillary Wedge Pressure are both severely elevated. There is moderate left ventricular systolic dysfunction. The left ventricular ejection fraction is 35-45% by visual estimate. Respiratory acidosis with pH of 7.0 and PCO2 of 93.  Was in the ED 04/08/21 due to seizures. Loaded with Keppra and neuro consulted. Released with neuro follow-up. Admitted 12/25/20 due to worsening shortness of breath. Placed on cpap. Cardiac arrest developed and he was shocked along with CPR with ROSC. Emergently intubated. Cardiology, orthopaedics, GI, ENT, neurology, nephrology and palliative care consults obtained. Hospital course complicated by persistent vent dependent respiratory failure eventually requiring tracheostomy, anoxic encephalopathy. PEG tube placed. Patient's mental status started to improve with toleration of decannulation & able to eat on his own. Given antibiotics and IV lasix with transition to oral medications. Required pressors while in ICU. MRI brain show right MCA infarct. 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 Losartan stopped due to hyperkalemia and renal dysfunction.  Discharged to SNF after 73 days.   He presents today for his initial visit with a chief complaint of   Review of Systems    Physical Exam    Assessment & Plan:  1: Chronic heart failure with preserved ejection fraction without structural changes- - NYHA class - BNP 01/04/21 was 25.5  2: HTN- - BP - seeing PCP at facility - [facility] 04/08/21 reviewed and showed sodium 140, potassium 3.8, creatinine 1.47 and GFR 54  3: DM- - A1c 02/26/21 was 7.1%  4:  History of CVA-

## 2021-04-20 ENCOUNTER — Ambulatory Visit: Payer: Medicaid Other | Admitting: Gastroenterology

## 2021-04-22 ENCOUNTER — Ambulatory Visit: Payer: Medicaid Other | Admitting: Cardiology

## 2021-05-14 ENCOUNTER — Encounter: Payer: Self-pay | Admitting: Cardiology

## 2021-05-14 DIAGNOSIS — I872 Venous insufficiency (chronic) (peripheral): Secondary | ICD-10-CM | POA: Insufficient documentation

## 2021-05-14 DIAGNOSIS — G4733 Obstructive sleep apnea (adult) (pediatric): Secondary | ICD-10-CM | POA: Insufficient documentation

## 2021-05-14 DIAGNOSIS — I447 Left bundle-branch block, unspecified: Secondary | ICD-10-CM | POA: Insufficient documentation

## 2021-05-14 NOTE — Progress Notes (Deleted)
Primary Care Provider: Crist Fat, MD Previously followed by: Huel Coventry, NP -  [facility]) - [address], [place], [place].  Cardiologist: None -  Previously Followed by: Dr. Beryle Beams  & Kyung Bacca, NP  ([facility]) Arvil Persons, MD - [facility] - Adv CHF,  Electrophysiologist: None  Had Telemedicine Visit with Illene Labrador. Daubert, MD [facility])  Clinic Note: No chief complaint on file.   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 (Chronic)     Other   Morbid obesity (HCC) (Chronic)   OSA (obstructive sleep apnea) (Chronic)    ===================================  HPI:    Jeremy Browning is a morbidly obese 60 y.o. male long-term smoker with a complex PMH notable for HYPERTENSIVE HEART DISEASE / RESOLVED NICM(longstanding poorly controlled HTN, with several admissions for HYPERTENSIVE EMERGENCY with type II MI, initially with reduced EF of 35%-now improved to normal 60-65% -> now HFpEF), LBBB DM-2 , COPD, OSA, BLE (L>R) Venous Insufficiency,  & CKD III, who presents today for delayed hospital follow-up.  Jeremy Browning is a 60 y.o. male who is being seen today for the evaluation of *** at the request of Rolly Salter, MD.  Jeremy Browning was last previously followed by [facility] (in [facility] - last seen 03/05/2019 by Dr. Alden Hipp - EP via Telemedicine).  Initially seen by Dr. Beryle Beams [facility]) back in October 2017 in consultation for  longstanding lower extremity edema felt to be secondary to venous incompetence.  He noted intermittent chest pain and dyspnea, but indicated that his edema has been worse previously.  Noted significant difficulty in obtaining medications (was living with his brother-had no job/income) -> relatively normal baseline BNP; was initiated on Lasix for diuresis and recommended compression stockings/foot elevation..  Plan was to titrate antihypertensive agents, and initiate lipid management.. First heart failure admission to [facility] 09/11/2018 -> Hypertensive Emergency/Flash Pulm Edema with chest pain.  BP 195/95.  Treated with BiPAP, IV NTG and nicardipine with IV diuretics. ->  EKG noted LBBB.  EF 35%, minimal CAD by cath. ->  Initiated on GDMT (losartan, carvedilol, aspirin, atorvastatin and furosemide -> not converted to [facility] because of worsening renal function and potassium levels.) -> EP C/s -CRT-D deferred in order to allow time for GDMT therapy-plan recheck in 3 months._> f/u with [facility] intermittent with [facility] Adv CHF. Admitted [facility] 3/4-03/2019: Acute respiratory failure with hypercapnia related to Hypertensive Emergency-Acute on Chronic Combined CHF complicated by COPD, Morbid Obesity and OSA ? Was not able to get Torsemide from [facility], so ran out of diuretic as well as other CHF meds--> converted back to Lasix & Medicaid Application initiated.  Treated with BiPAP, NTG gtt, and IV diuresis.  Improved combined acidosis. D/c Meds: Lasix 80 mg twice daily carvedilol 6.25 mg twice daily, losartan 50 mg daily (financially not able to get Entresto), atorvastatin 80 mg daily, aspirin 81 mg daily. Filled his pillbox test performed by OT.  Hence his brother and sister-in-law were trained on how to ensure he takes  his medications correctly Last recorded Adv CHF visit with Dr. Michae Kava from [facility] was 01/08/2019: BP well controlled.  Noted increasing L>R LE Edema with mild abdominal  distention and early satiety.  Was gaining weight but denied worsening DOE, PND or orthopnea.  Notes only modest response to Lasix.  Still unable to afford CPAP.  (Noted potential previous dry weight of 262 pounds. NYHA Class II-III. -> discussed increasing Lasix to 120 mg prn wgt gain. -> referred to EP for ? CRTP/D 2/2 LBBB ->  Telemedicine Visit (Dr. Alden Hipp - EP):  planned to have him come in to recheck EKG to confirm LBBB & if still present, would discuss BiVPPM  Recent Hospitalizations:  12/25/2020: Admitted to [facility] via EMS with Acute Combined Hypoxic and Hypercapnic Respiratory Failure with Hypertensive Emergency.  Became Obtunded upon Arrival to the ER and Was Urgently Intubated.  Subsequently Digressed into Ventricular Tachycardia (Cardiopulmonary Rest Arrest)-Short Course CPR with ROSC.  Initial EKG Revealed Global Ischemia and Possible LBBB Versus IVCD, was hypercapnic and acidemic with a pH of 7.1.-> Emergent Cath revealed minimal CAD, EF ~35-40% with mildly reduced CO-CI & only Mild 2nd-ry Pulm HTN (See below) --> Admitted to ICU.    Reviewed  CV studies:    The following studies were reviewed today: (if available, images/films reviewed: From Epic Chart or Care Everywhere) Echo x 2 & LHC from [facility] Recorded in [facility]  From 5//2022 [facility] Admission Right & Left Heart Cath 12/25/2020: Angiographically minimal CAD.  Moderately reduced LV function with EF of 35 to 45% - CO-CI 6.54-2.82. Mild Secondary Pulmonary Hypertension (PCWP 30 mmHg.  PAP 47/13 mmHg with a mean PAP of 33 mmHg, RAP 22 mmHg in setting of AoP-MAP 97/59 mmHg - 71 mmHg, and LV P-EDP 101/21 mmHg - 30 mmHg..   TTE 12/27/2020: EF 45-50%. Mildly reduced LVEF with global HK. Moderately dilated LV with Gr 1 DD - mild LA dilation. Normal RV size Fxn.  Normal Vavles TTE 03/07/2021: EF improved to 60-65% with Nl LV Fxn & ~ normal diastolic fxn. Normal RV size & fxn. Mild MR & AI.  Normal LA size.   Interval History:   Shannan Garfinkel   CV  Review of Symptoms (Summary) Cardiovascular ROS: {roscv:[id number]}  REVIEWED OF SYSTEMS   ROS  I have reviewed and (if needed) personally updated the patient's problem list, medications, allergies, past medical and surgical history, social and family history.   PAST MEDICAL HISTORY   Past Medical History:  Diagnosis Date   Diabetes mellitus type II, non insulin dependent (HCC)    Essential hypertension [name]   h/o Proteus pneumonia (HCC) 02/17/2021   Heart failure (HCC)    Unknown details.  By report no cardiac surgery   Severe hypoxic-ischemic encephalopathy    Venous stasis dermatitis of left lower extremity     PAST SURGICAL HISTORY   Past Surgical History:  Procedure Laterality Date   LEFT HEART CATH AND CORONARY ANGIOGRAPHY N/A 12/25/2020   Procedure: LEFT HEART CATH AND CORONARY ANGIOGRAPHY;  Surgeon: Marykay Lex, MD;  Location: [facility] INVASIVE CV LAB;  Service: Cardiovascular;  Laterality: N/A; (In setting of Acute Hypoxic and Hypercapnic Respiratory Failure with Flash Pulm Edema/Hypertensive Emergency) - Angiographically minimal CAD.  Moderately reduced LV function with EF of 35 to 45%. LV P-EDP 101/21 mmHg - 30 mmHg   LEFT HEART CATH AND CORONARY ANGIOGRAPHY  09/12/2018   [facility]): Ostial LAD ~20% otherwise normal coronary arteries. LVEDP 20 mmHg;- HTN Emergency related Type II  MI>   PEG PLACEMENT N/A 01/11/2021   Procedure: PERCUTANEOUS ENDOSCOPIC GASTROSTOMY (PEG) PLACEMENT;  Surgeon: Midge Minium, MD;  Location: [facility] ENDOSCOPY;  Service: Endoscopy;  Laterality: N/A;   RIGHT HEART CATH N/A 12/25/2020   Procedure: RIGHT HEART CATH;  Surgeon: Marykay Lex, MD;  Location: [facility] INVASIVE CV LAB;  Service: Cardiovascular; R IJ: (Acute Hypoxic and Hypercapnic Respiratory Failure with Flash Pulm Edema/Hypertensive Emergency): CO-CI 6.54-2.82. Mild Secondary Pulmonary Hypertension (PCWP 30 mmHg.  PAP 47/13 mmHg with a mean PAP of 33 mmHg, RAP 22 mmHg in setting of AoP-MAP  97/59 mmHg - 71 mmHg,   TRACHEOSTOMY TUBE PLACEMENT N/A 01/12/2021   Procedure: TRACHEOSTOMY;  Surgeon: Bud Face, MD;  Location: [facility] ORS;  Service: ENT;  Laterality: N/A;   TRANSTHORACIC ECHOCARDIOGRAM  09/11/2018   [facility]: Moderately reduced LVEF ~35%. 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 Mild-mod AI, Trivial MR?TR. LVEDD 6.0 (reduced compared to 2017: EF ~40-45%, LVEDD 5.6   TRANSTHORACIC ECHOCARDIOGRAM  12/27/2020   [facility]: (In Setting of Acute Hypoxic and Hypercapnic Respiratory Failure with Flash Pulm Edema/Hypertensive Emergency)  EF 45-50%. Mildly reduced LVEF with global HK. Moderately dilated LV with Gr 1 DD - mild LA dilation. Normal RV size Fxn.  Normal Vavles   TRANSTHORACIC ECHOCARDIOGRAM  03/07/2021   [facility]: EF improved to 60-65% with Nl LV Fxn & ~ normal diastolic fxn. Normal RV size & fxn. Mild MR & AI.  Normal LA size.   Unknown      Immunization History  Administered Date(s) Administered   PFIZER Comirnaty(Gray Top)Covid-19 Tri-Sucrose Vaccine 12/19/2019    MEDICATIONS/ALLERGIES   No outpatient medications have been marked as taking for the 05/26/21 encounter (Office Visit) with Marykay Lex, MD.    Not on File  SOCIAL HISTORY/FAMILY HISTORY   Reviewed in Epic:  Pertinent findings:  Social History   Tobacco Use   Smoking status: Former    Packs/day: 0.50    Years: 7.00    Pack years: 3.50    Types: Cigarettes    Quit date: 09/11/2018    Years since quitting: 2.6   Smokeless tobacco: Never  Vaping Use   Vaping Use: Never used  Substance Use Topics   Alcohol use: Not Currently   Drug use: Not Currently   Social History   Social History Narrative   Widowed/Single. Unemployed -- Brother provides financial support.    He lives with his brother and the brother's family.  (Sister-In-Law along with Niece &Nephew) -- has "his room".    [place] moved here to the [facility] [place] (prior to May 2022 [facility] Admission).  Has  never been established here at [facility].      ->  According to his Brother, he likely related to cognitive difficulties (only 3rd Grade Education - unable to read/write), he does not have a job, has difficulty affording medications and other therapies.      Previously worked Environmental manager Engineer, structural for [employer];      Prior to moving to [place] - was going to [facility]) in [place] -- was able get most of his medications from there.      Previously Followed by:    Vivi Martens, NP -                            [facility] [address] [place], [place].    o Dr. Beryle Beams  &  Kyung Bacca, NP  ([facility])    o Arvil Persons, MD - [facility] - Adv CHF,      o Had Telemedicine Visit with Illene Labrador. Daubert, MD ([facility])     OBJCTIVE -PE, EKG, labs   Wt Readings from Last 3 Encounters:  02/28/21 246 lb 0.5 oz (111.6 kg)    Physical Exam: There were no vitals taken for this visit. Physical Exam   Adult ECG Report  Rate: *** ;  Rhythm: {rhythm:17366};   Narrative Interpretation: ***  Recent Labs:  ***  Lab Results  Component Value Date   CHOL 149 12/26/2020   HDL 36 (L) 12/26/2020   LDLCALC 95 12/26/2020   TRIG 138 01/12/2021   CHOLHDL 4.1 12/26/2020   Lab Results  Component Value Date   CREATININE 1.47 (H) 04/08/2021   BUN 11 04/08/2021   NA 140 04/08/2021   K 3.8 04/08/2021   CL 105 04/08/2021   CO2 16 (L) 04/08/2021   CBC Latest Ref Rng & Units 04/08/2021 03/07/2021 03/03/2021  WBC 4.0 - 10.5 K/uL 8.0 14.2(H) 13.2(H)  Hemoglobin 13.0 - 17.0 g/dL 11.9(L) 11.2(L) 10.8(L)  Hematocrit 39.0 - 52.0 % 38.4(L) 36.1(L) 35.5(L)  Platelets 150 - 400 K/uL 209 213 167    Lab Results  Component Value Date   HGBA1C 7.1 (H) 02/26/2021   Lab Results  Component Value Date   TSH 4.140 03/07/2021    ==================================================  COVID-19 Education: The signs and symptoms of COVID-19 were discussed  with the patient and how to seek care for testing (follow up with PCP or arrange E-visit).    I spent a total of ***minutes with the patient spent in direct patient consultation.  Additional time spent with chart review  / charting (studies, outside notes, etc): *** min Total Time: *** min  Current medicines are reviewed at length with the patient today.  (+/- concerns) ***  This visit occurred during the SARS-CoV-2 public health emergency.  Safety protocols were in place, including screening questions prior to the visit, additional usage of staff PPE, and extensive cleaning of exam room while observing appropriate contact time as indicated for disinfecting solutions.  Notice: This dictation was prepared with Dragon dictation along with smaller phrase technology. Any transcriptional errors that result from this process are unintentional and may not be corrected upon review.  Patient Instructions / Medication Changes & Studies & Tests Ordered   There are no Patient Instructions on file for this visit.   Studies Ordered:   No orders of the defined types were placed in this encounter.    Bryan Lemma, M.D., M.S. Interventional Cardiologist   Pager # [phone] Phone # [phone] [address] [place], [place] [address]   Thank you for choosing Heartcare at [facility]!!

## 2021-05-18 ENCOUNTER — Encounter: Payer: Self-pay | Admitting: Family

## 2021-05-18 ENCOUNTER — Other Ambulatory Visit: Payer: Self-pay

## 2021-05-18 ENCOUNTER — Ambulatory Visit: Payer: Medicaid Other | Attending: Family | Admitting: Family

## 2021-05-18 VITALS — BP 150/55 | HR 60 | Resp 16 | Ht 68.0 in

## 2021-05-18 DIAGNOSIS — Z7982 Long term (current) use of aspirin: Secondary | ICD-10-CM | POA: Diagnosis not present

## 2021-05-18 DIAGNOSIS — Z8674 Personal history of sudden cardiac arrest: Secondary | ICD-10-CM | POA: Diagnosis not present

## 2021-05-18 DIAGNOSIS — Z833 Family history of diabetes mellitus: Secondary | ICD-10-CM | POA: Diagnosis not present

## 2021-05-18 DIAGNOSIS — R569 Unspecified convulsions: Secondary | ICD-10-CM

## 2021-05-18 DIAGNOSIS — Z87891 Personal history of nicotine dependence: Secondary | ICD-10-CM | POA: Diagnosis not present

## 2021-05-18 DIAGNOSIS — E1122 Type 2 diabetes mellitus with diabetic chronic kidney disease: Secondary | ICD-10-CM | POA: Diagnosis not present

## 2021-05-18 DIAGNOSIS — I5032 Chronic diastolic (congestive) heart failure: Secondary | ICD-10-CM

## 2021-05-18 DIAGNOSIS — Z8249 Family history of ischemic heart disease and other diseases of the circulatory system: Secondary | ICD-10-CM | POA: Diagnosis not present

## 2021-05-18 DIAGNOSIS — I2729 Other secondary pulmonary hypertension: Secondary | ICD-10-CM | POA: Insufficient documentation

## 2021-05-18 DIAGNOSIS — I890 Lymphedema, not elsewhere classified: Secondary | ICD-10-CM

## 2021-05-18 DIAGNOSIS — Z79899 Other long term (current) drug therapy: Secondary | ICD-10-CM | POA: Insufficient documentation

## 2021-05-18 DIAGNOSIS — Z8673 Personal history of transient ischemic attack (TIA), and cerebral infarction without residual deficits: Secondary | ICD-10-CM | POA: Diagnosis not present

## 2021-05-18 DIAGNOSIS — I130 Hypertensive heart and chronic kidney disease with heart failure and stage 1 through stage 4 chronic kidney disease, or unspecified chronic kidney disease: Secondary | ICD-10-CM | POA: Insufficient documentation

## 2021-05-18 DIAGNOSIS — N1831 Chronic kidney disease, stage 3a: Secondary | ICD-10-CM

## 2021-05-18 DIAGNOSIS — I10 Essential (primary) hypertension: Secondary | ICD-10-CM

## 2021-05-18 NOTE — Progress Notes (Signed)
Patient ID: Jeremy Browning, male    DOB: 02/07/1961, 60 y.o.   MRN: [id number]   HPI   Jeremy Browning is a 60 y/o male with a history of DM, HTN, cardiac arrest, CVA, CKD, smoker, and seizure disorder.    Echo report from 03/07/21 reviewed and showed an EF of 60-65% along with mild Jeremy and no LVH.    LHC/RHC done 12/25/20 showed: 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 LV End Diastolic Pressure and Pulmonary Capillary Wedge Pressure are both severely elevated. There is moderate left ventricular systolic dysfunction. The left ventricular ejection fraction is 35-45% by visual estimate. Respiratory acidosis with pH of 7.0 and PCO2 of 93.   Was in the ED 04/08/21 due to seizure thought to be due to recent CVA. Neurology consulted and keppra started & he was released. Admitted 12/25/20 due to shortness of breath with hypoxia with subsequent cardiac arrest. Intubated and then developed VT with resultant cardioversion. Required trach and developed anoxic encephalopathy. Cardiology, neurology, nephrology, palliative care, ENT, GI and orthopaedic referrals done. Needed tube feeding through PEG but then able to stop those as mental status improved. Required pressors while in ICU. Oral HF/ HTN medications adjusted. Brain MRI showed right MCA infarct. Discharged after 73 days.    He presents today for his initial visit with a chief complaint of moderate shortness of breath with minimal exertion. He describes this as having been present for several months. He has associated pedal edema and weakness along with this. He denies any dizziness, cough, wheezing, chest pain, palpitations or fatigue.   Is currently not getting weighed daily at [facility]. Says that he's recently had lab work and is supposed to be getting a leg ultrasound done to rule out a blood clot.   Past Medical History:  Diagnosis Date   Diabetes mellitus type II, non insulin dependent (HCC)    Essential hypertension [name]    h/o Proteus pneumonia (HCC) 02/17/2021   Heart failure (HCC)    Unknown details.  By report no cardiac surgery   Severe hypoxic-ischemic encephalopathy    Venous stasis dermatitis of left lower extremity    Past Surgical History:  Procedure Laterality Date   LEFT HEART CATH AND CORONARY ANGIOGRAPHY N/A 12/25/2020   Procedure: LEFT HEART CATH AND CORONARY ANGIOGRAPHY;  Surgeon: Marykay Lex, MD;  Location: [facility] INVASIVE CV LAB;  Service: Cardiovascular;  Laterality: N/A; (In setting of Acute Hypoxic and Hypercapnic Respiratory Failure with Flash Pulm Edema/Hypertensive Emergency) - Angiographically minimal CAD.  Moderately reduced LV function with EF of 35 to 45%. LV P-EDP 101/21 mmHg - 30 mmHg   LEFT HEART CATH AND CORONARY ANGIOGRAPHY  09/12/2018   [facility]): Ostial LAD ~20% otherwise normal coronary arteries. LVEDP 20 mmHg;- HTN Emergency related Type II MI>   PEG PLACEMENT N/A 01/11/2021   Procedure: PERCUTANEOUS ENDOSCOPIC GASTROSTOMY (PEG) PLACEMENT;  Surgeon: Midge Minium, MD;  Location: [facility] ENDOSCOPY;  Service: Endoscopy;  Laterality: N/A;   RIGHT HEART CATH N/A 12/25/2020   Procedure: RIGHT HEART CATH;  Surgeon: Marykay Lex, MD;  Location[facility] INVASIVE CV LAB;  Service: Cardiovascular; R IJ: (Acute Hypoxic and Hypercapnic Respiratory Failure with Flash Pulm Edema/Hypertensive Emergency): CO-CI 6.54-2.82. Mild Secondary Pulmonary Hypertension (PCWP 30 mmHg.  PAP 47/13 mmHg with a mean PAP of 33 mmHg, RAP 22 mmHg in setting of AoP-MAP 97/59 mmHg - 71 mmHg,   TRACHEOSTOMY TUBE PLACEMENT N/A 01/12/2021   Procedure: TRACHEOSTOMY;  Surgeon: Bud Face, MD;  Location: [facility]  ORS;  Service: ENT;  Laterality: N/A;   TRANSTHORACIC ECHOCARDIOGRAM  09/11/2018   [facility]: Moderately reduced LVEF ~35%. 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 Mild-mod AI, Trivial Jeremy?TR. LVEDD 6.0 (reduced compared to 2017: EF ~40-45%, LVEDD 5.6   TRANSTHORACIC ECHOCARDIOGRAM  12/27/2020    [facility]: (In Setting of Acute Hypoxic and Hypercapnic Respiratory Failure with Flash Pulm Edema/Hypertensive Emergency)  EF 45-50%. Mildly reduced LVEF with global HK. Moderately dilated LV with Gr 1 DD - mild LA dilation. Normal RV size Fxn.  Normal Vavles   TRANSTHORACIC ECHOCARDIOGRAM  03/07/2021   [facility]: EF improved to 60-65% with Nl LV Fxn & ~ normal diastolic fxn. Normal RV size & fxn. Mild Jeremy & AI.  Normal LA size.   Unknown     Family History  Problem Relation Age of Onset   Asthma Mother    Hypertension Mother        LIkely Severe/Malignant   Kidney failure Mother        ESRD - HD   CVA Mother    Alcohol abuse Father    Depression Father    Asthma Sister    Hypertension Sister    Diabetes Mellitus II Sister    Asthma Brother    Hypertension Brother    Social History   Tobacco Use   Smoking status: Former    Packs/day: 0.50    Years: 7.00    Pack years: 3.50    Types: Cigarettes    Quit date: 09/11/2018    Years since quitting: 2.6   Smokeless tobacco: Never  Substance Use Topics   Alcohol use: Not Currently   Not on File Prior to Admission medications   Medication Sig Start Date End Date Taking? Authorizing Provider  amLODipine (NORVASC) 2.5 MG tablet Take 1 tablet (2.5 mg total) by mouth daily. 03/08/21 03/08/22 Yes Rolly Salter, MD  aspirin EC 81 MG tablet Take 81 mg by mouth daily. Swallow whole.   Yes [provider]  atorvastatin (LIPITOR) 80 MG tablet Take 80 mg by mouth daily.   Yes [provider]  carvedilol (COREG) 6.25 MG tablet Take 1 tablet (6.25 mg total) by mouth 2 (two) times daily with a meal. 03/08/21  Yes Rolly Salter, MD  diclofenac Sodium (VOLTAREN) 1 % GEL Apply 2 g topically 4 (four) times daily. 03/08/21  Yes Rolly Salter, MD  docusate sodium (COLACE) 100 MG capsule Take 1 capsule (100 mg total) by mouth 2 (two) times daily. 03/08/21  Yes Rolly Salter, MD  famotidine (PEPCID) 20 MG tablet Take 1 tablet (20 mg total) by  mouth daily. 03/09/21  Yes Rolly Salter, MD  levETIRAcetam (KEPPRA) 500 MG tablet Take 1 tablet (500 mg total) by mouth 2 (two) times daily. 04/08/21  Yes Jene Every, MD  Multiple Vitamin (MULTIVITAMIN WITH MINERALS) TABS tablet Place 1 tablet into feeding tube daily. 03/09/21  Yes Rolly Salter, MD  Nutritional Supplements (FEEDING SUPPLEMENT, NEPRO CARB STEADY,) LIQD Take 237 mLs by mouth 3 (three) times daily between meals. 03/08/21  Yes Rolly Salter, MD  polyethylene glycol (MIRALAX / GLYCOLAX) 17 g packet Take 17 g by mouth daily. 03/09/21  Yes Rolly Salter, MD  potassium chloride (KLOR-CON) 10 MEQ tablet Take 10 mEq by mouth daily.   Yes [provider]  simethicone (MYLICON) 80 MG chewable tablet Chew 1 tablet (80 mg total) by mouth 4 (four) times daily. 03/08/21  Yes Rolly Salter,  MD  predniSONE (DELTASONE) 10 MG tablet Take 20mg  daily for 3days,Take 10mg  daily for 3days, then stop Patient not taking: Reported on 05/18/2021 03/08/21   05/20/2021, MD  Water For Irrigation, Sterile (FREE WATER) SOLN Place 30 mLs into feeding tube every 4 (four) hours. 03/08/21   Rolly Salter, MD     Review of Systems  Constitutional:  Negative for diaphoresis, malaise/fatigue and weight loss.  HENT: Negative.    Eyes: Negative.   Respiratory:  Positive for shortness of breath (with minimal exertion). Negative for cough and wheezing.   Cardiovascular:  Positive for leg swelling (L > R). Negative for chest pain, palpitations and orthopnea.  Gastrointestinal: Negative.   Genitourinary: Negative.   Musculoskeletal:  Negative for back pain and myalgias.  Skin: Negative.   Neurological:  Positive for speech change (dysarthric) and weakness (secondary to CVA). Negative for dizziness and seizures (none since last hospital ED visit).  Endo/Heme/Allergies: Negative.   Psychiatric/Behavioral:  The patient is not nervous/anxious.        Vitals:   05/18/21 1338  BP: (!) 150/55  Pulse:  60  SpO2: 100%  Height: 5\' 8"  (1.727 m)   Wt Readings from Last 3 Encounters:  02/28/21 246 lb 0.5 oz (111.6 kg)   Lab Results  Component Value Date   CREATININE 1.47 (H) 04/08/2021   CREATININE 1.30 (H) 03/08/2021   CREATININE 1.27 (H) 03/07/2021     Physical Exam Vitals and nursing note reviewed.  Constitutional:      General: He is not in acute distress.    Appearance: He is not diaphoretic.  Neck:     Comments:  - JVD Cardiovascular:     Rate and Rhythm: Normal rate and regular rhythm.     Pulses: Normal pulses.     Heart sounds: Normal heart sounds. No murmur heard.   No gallop.  Pulmonary:     Effort: No respiratory distress.     Breath sounds: Normal breath sounds. No wheezing or rales.  Abdominal:     General: There is no distension.     Palpations: Abdomen is soft.  Musculoskeletal:        General: Swelling present.     Right lower leg: Edema (+ 2) present.     Left lower leg: Edema (+ 2 left leg grossly larger than right) present.  Skin:    General: Skin is warm.  Neurological:     Mental Status: He is alert and oriented to person, place, and time.     Motor: Weakness present.  Psychiatric:        Judgment: Judgment normal.      Assessment & Plan:   1: Chronic heart failure with preserved ejection fraction without structural changes- - NYHA class III - he voices adhering to a low sodium diet - order placed for facility to weigh daily and report to HF clinic if wt gain > 2 lbs/day or 5lbs/wk noted - EF 60-65% - sees cardiology 04/10/2021) 05/26/21 - BNP 01/04/21 was 25.5 - on carvedilol, lasix started yesterday at facility    2: HTN- - BP 150/55 - currently in SNF and sees MD at facility  - [facility] 04/08/21 reviewed and showed sodium 140, potassium 3.8, creatinine 1.47 and GFR 54   3: DM- - A1c 02/26/21 was 7.1%   4: Seizure/ stroke- - recent ED visit, started on Keppra, no seizure activity since - ASA 81mg  daily  5: Lymphedema - left leg grossly  larger than  right - venous doppler ordered by facility on 05/17/21 - he is propping them up when he is stationary for a period of time - BMP ordered by facility on 05/17/21    Medication list brought from facility.   Return in two months or sooner if needed for heart failure symptoms.

## 2021-05-18 NOTE — Patient Instructions (Signed)
Return in 2 months  Start weighing daily at facility   Call clinic for wt gain of 2 lbs overnight or 5 lbs within 1 week

## 2021-05-18 NOTE — Progress Notes (Deleted)
   Patient ID: Jeremy Browning, male    DOB: 1961/02/29, 60 y.o.   MRN: [id number]  HPI  Jeremy Browning is a 60 y/o male with a history of  Echo report from 03/07/21 reviewed and showed an EF of 60-65% along with mild Jeremy and no LVH.   LHC/RHC done 12/25/20 showed: 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 LV End Diastolic Pressure and Pulmonary Capillary Wedge Pressure are both severely elevated. There is moderate left ventricular systolic dysfunction. The left ventricular ejection fraction is 35-45% by visual estimate. Respiratory acidosis with pH of 7.0 and PCO2 of 93.  Was in the ED 04/08/21 due to seizure thought to be due to recent CVA. Neurology consulted and keppra started & he was released. Admitted 12/25/20 due to shortness of breath with hypoxia with subsequent cardiac arrest. Intubated and then developed VT with resultant cardioversion. Required trach and developed anoxic encephalopathy. Cardiology, neurology, nephrology, palliative care, ENT, GI and orthopaedic referrals done. Needed tube feeding through PEG but then able to stop those as mental status improved. Required pressors while in ICU. Oral HF/ HTN medications adjusted. Brain MRI showed right MCA infarct. Discharged after 73 days.   He presents today for his initial visit with a chief complaint of   Review of Systems    Physical Exam    Assessment & Plan:  1: Chronic heart failure with preserved ejection fraction without structural changes- - NYHA class - sees cardiology Jeremy Browning) 05/26/21 - BNP 01/04/21 was 25.5  2: HTN- - BP - saw PCP at [facility] on 06/22/20 - BMP 04/08/21 reviewed and showed sodium 140, potassium 3.8, creatinine 1.47 and GFR 54  3: DM- - A1c 02/26/21 was 7.1%  4: Seizure/ stroke-

## 2021-05-19 ENCOUNTER — Telehealth: Payer: Self-pay | Admitting: Family

## 2021-05-19 NOTE — Telephone Encounter (Signed)
Received lab results from [facility] dated 05/17/21:  Sodium 141 Potassium 4.4 Creatinine 1.35 BUN 11.3 GFR 65.07 Hemoglobin 11.1

## 2021-05-26 ENCOUNTER — Encounter: Payer: Medicaid Other | Admitting: Cardiology

## 2021-05-26 DIAGNOSIS — I428 Other cardiomyopathies: Secondary | ICD-10-CM

## 2021-05-26 DIAGNOSIS — G4733 Obstructive sleep apnea (adult) (pediatric): Secondary | ICD-10-CM

## 2021-05-26 DIAGNOSIS — I5042 Chronic combined systolic (congestive) and diastolic (congestive) heart failure: Secondary | ICD-10-CM

## 2021-05-26 DIAGNOSIS — I441 Atrioventricular block, second degree: Secondary | ICD-10-CM

## 2021-05-26 DIAGNOSIS — I872 Venous insufficiency (chronic) (peripheral): Secondary | ICD-10-CM

## 2021-05-26 DIAGNOSIS — I447 Left bundle-branch block, unspecified: Secondary | ICD-10-CM

## 2021-05-26 DIAGNOSIS — I469 Cardiac arrest, cause unspecified: Secondary | ICD-10-CM

## 2021-06-06 ENCOUNTER — Ambulatory Visit (INDEPENDENT_AMBULATORY_CARE_PROVIDER_SITE_OTHER): Payer: Medicaid Other | Admitting: Cardiology

## 2021-06-06 ENCOUNTER — Encounter: Payer: Self-pay | Admitting: Cardiology

## 2021-06-06 ENCOUNTER — Other Ambulatory Visit: Payer: Self-pay

## 2021-06-06 VITALS — BP 130/66 | HR 58 | Ht 68.0 in

## 2021-06-06 DIAGNOSIS — I10 Essential (primary) hypertension: Secondary | ICD-10-CM | POA: Diagnosis not present

## 2021-06-06 DIAGNOSIS — E7800 Pure hypercholesterolemia, unspecified: Secondary | ICD-10-CM | POA: Diagnosis not present

## 2021-06-06 DIAGNOSIS — I428 Other cardiomyopathies: Secondary | ICD-10-CM | POA: Diagnosis not present

## 2021-06-06 NOTE — Patient Instructions (Signed)
Medication Instructions:  Your physician recommends that you continue on your current medications as directed. Please refer to the Current Medication list given to you today.  *If you need a refill on your cardiac medications before your next appointment, please call your pharmacy*   Lab Work:  Your physician recommends that you have lab work (BMP) drawn in 2 weeks.  Testing/Procedures: None ordered   Follow-Up: At [facility], you and your health needs are our priority.  As part of our continuing mission to provide you with exceptional heart care, we have created designated Provider Care Teams.  These Care Teams include your primary Cardiologist (physician) and Advanced Practice Providers (APPs -  Physician Assistants and Nurse Practitioners) who all work together to provide you with the care you need, when you need it.  We recommend signing up for the patient portal called "MyChart".  Sign up information is provided on this After Visit Summary.  MyChart is used to connect with patients for Virtual Visits (Telemedicine).  Patients are able to view lab/test results, encounter notes, upcoming appointments, etc.  Non-urgent messages can be sent to your provider as well.   To learn more about what you can do with MyChart, go to [email or web address].    Your next appointment:   6 month(s)  The format for your next appointment:   In Person  Provider:   Debbe Odea, MD   Other Instructions

## 2021-06-06 NOTE — Progress Notes (Signed)
Cardiology Office Note:    Date:  06/06/2021   ID:  Jeremy Browning, DOB 1960/10/24, MRN [id number]  PCP:  Crist Fat, MD   [facility] Providers Cardiologist:  None     Referring MD: Rolly Salter, MD   Chief Complaint  Patient presents with   New Patient (Initial Visit)    Referred by PCP for Acute Chronic Diastolic HF. Meds reviewed verbally with patient.     History of Present Illness:    Jeremy Browning is a 60 y.o. male with a hx of NICM (initial EF 35%, EF 02/2021 was 50%), CVA with left arm weakness, hypertension, mild to moderate AI, left bundle branch block, COPD, hyperlipidemia, presenting to establish care due to CHF.  Patient was previously managed from a cardiac perspective at [facility].  EMR reports indicate EF as low as 35%.  GDMT included Coreg, losartan.  Recent hospitalization to [facility] 3 months ago with respiratory failure, hypercapnia, VT requiring cardioversion.  Respiratory failure required tracheostomy, PEG.  Echocardiogram showed improved ejection fraction 50 to 55%, impaired relaxation.  Losartan was discontinued on discharge due to hyperkalemia.  Amlodipine was started.  Endorsed lower extremity swelling, left arm weakness, denies chest pain.  Lives in an assisted living facility, will like to go home to eat his food.  Not interested in answering my questions.  States having a recent stroke 2 days ago.  Prior notes Echocardiogram obtained 02/2021 EF 50-55%, grade 1 diastolic dysfunction noted. Left heart cath 12/2020 no evidence of CAD.  Past Medical History:  Diagnosis Date   Diabetes mellitus type II, non insulin dependent (HCC)    Essential hypertension [name]   h/o Proteus pneumonia (HCC) 02/17/2021   Heart failure (HCC)    Unknown details.  By report no cardiac surgery   Severe hypoxic-ischemic encephalopathy    Venous stasis dermatitis of left lower extremity     Past Surgical History:  Procedure Laterality Date   LEFT HEART  CATH AND CORONARY ANGIOGRAPHY N/A 12/25/2020   Procedure: LEFT HEART CATH AND CORONARY ANGIOGRAPHY;  Surgeon: Marykay Lex, MD;  Location: [facility] INVASIVE CV LAB;  Service: Cardiovascular;  Laterality: N/A; (In setting of Acute Hypoxic and Hypercapnic Respiratory Failure with Flash Pulm Edema/Hypertensive Emergency) - Angiographically minimal CAD.  Moderately reduced LV function with EF of 35 to 45%. LV P-EDP 101/21 mmHg - 30 mmHg   LEFT HEART CATH AND CORONARY ANGIOGRAPHY  09/12/2018   [facility]): Ostial LAD ~20% otherwise normal coronary arteries. LVEDP 20 mmHg;- HTN Emergency related Type II MI>   PEG PLACEMENT N/A 01/11/2021   Procedure: PERCUTANEOUS ENDOSCOPIC GASTROSTOMY (PEG) PLACEMENT;  Surgeon: Midge Minium, MD;  Location: [facility] ENDOSCOPY;  Service: Endoscopy;  Laterality: N/A;   RIGHT HEART CATH N/A 12/25/2020   Procedure: RIGHT HEART CATH;  Surgeon: Marykay Lex, MD;  Location: [facility] INVASIVE CV LAB;  Service: Cardiovascular; R IJ: (Acute Hypoxic and Hypercapnic Respiratory Failure with Flash Pulm Edema/Hypertensive Emergency): CO-CI 6.54-2.82. Mild Secondary Pulmonary Hypertension (PCWP 30 mmHg.  PAP 47/13 mmHg with a mean PAP of 33 mmHg, RAP 22 mmHg in setting of AoP-MAP 97/59 mmHg - 71 mmHg,   TRACHEOSTOMY TUBE PLACEMENT N/A 01/12/2021   Procedure: TRACHEOSTOMY;  Surgeon: Bud Face, MD;  Location: [facility] ORS;  Service: ENT;  Laterality: N/A;   TRANSTHORACIC ECHOCARDIOGRAM  09/11/2018   [facility]: Moderately reduced LVEF ~35%. 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 Mild-mod AI, Trivial MR?TR. LVEDD 6.0 (reduced compared to 2017: EF ~40-45%,  LVEDD 5.6   TRANSTHORACIC ECHOCARDIOGRAM  12/27/2020   [facility]: (In Setting of Acute Hypoxic and Hypercapnic Respiratory Failure with Flash Pulm Edema/Hypertensive Emergency)  EF 45-50%. Mildly reduced LVEF with global HK. Moderately dilated LV with Gr 1 DD - mild LA dilation. Normal RV size Fxn.  Normal Vavles   TRANSTHORACIC  ECHOCARDIOGRAM  03/07/2021   [facility]: EF improved to 60-65% with Nl LV Fxn & ~ normal diastolic fxn. Normal RV size & fxn. Mild MR & AI.  Normal LA size.   Unknown      Current Medications: Current Meds  Medication Sig   amLODipine (NORVASC) 2.5 MG tablet Take 1 tablet (2.5 mg total) by mouth daily.   aspirin EC 81 MG tablet Take 81 mg by mouth daily. Swallow whole.   atorvastatin (LIPITOR) 80 MG tablet Take 80 mg by mouth daily.   carvedilol (COREG) 6.25 MG tablet Take 1 tablet (6.25 mg total) by mouth 2 (two) times daily with a meal.   diclofenac Sodium (VOLTAREN) 1 % GEL Apply 2 g topically 4 (four) times daily.   docusate sodium (COLACE) 100 MG capsule Take 1 capsule (100 mg total) by mouth 2 (two) times daily.   famotidine (PEPCID) 20 MG tablet Take 1 tablet (20 mg total) by mouth daily.   furosemide (LASIX) 20 MG tablet Take 20 mg by mouth daily.   levETIRAcetam (KEPPRA) 500 MG tablet Take 1 tablet (500 mg total) by mouth 2 (two) times daily.   Multiple Vitamin (MULTIVITAMIN WITH MINERALS) TABS tablet Place 1 tablet into feeding tube daily.   Nutritional Supplements (FEEDING SUPPLEMENT, NEPRO CARB STEADY,) LIQD Take 237 mLs by mouth 3 (three) times daily between meals.   polyethylene glycol (MIRALAX / GLYCOLAX) 17 g packet Take 17 g by mouth daily.   potassium chloride (KLOR-CON) 10 MEQ tablet Take 10 mEq by mouth daily.   simethicone (MYLICON) 80 MG chewable tablet Chew 1 tablet (80 mg total) by mouth 4 (four) times daily.   Water For Irrigation, Sterile (FREE WATER) SOLN Place 30 mLs into feeding tube every 4 (four) hours.     Allergies:   Patient has no allergy information on record.   Social History   Socioeconomic History   Marital status: Widowed    Spouse name: Not on file   Number of children: 0   Years of education: 3   Highest education level: Not on file  Occupational History   Not on file  Tobacco Use   Smoking status: Former    Packs/day: 0.50    Years: 7.00     Pack years: 3.50    Types: Cigarettes    Quit date: 09/11/2018    Years since quitting: 2.7   Smokeless tobacco: Never  Vaping Use   Vaping Use: Never used  Substance and Sexual Activity   Alcohol use: Not Currently   Drug use: Not Currently   Sexual activity: Not Currently  Other Topics Concern   Not on file  Social History Narrative   Widowed/Single. Unemployed -- Brother provides financial support.    He lives with his brother and the brother's family.  (Sister-In-Law along with Niece &Nephew) -- has "his room".    Recently moved here to the [facility] area from ~[place] (prior to May 2022 [facility] Admission).  Has never been established here at [facility].      ->  According to his Brother, he likely related to cognitive difficulties (only 3rd Grade Education - unable to read/write), he does not  have a job, has difficulty affording medications and other therapies.      Previously worked Environmental manager Engineer, structural for [employer];      Prior to moving to [place] - was going to [facility]) in [place] -- was able get most of his medications from there.      Previously Followed by:    Vivi Martens, NP -                            [facility]- [address] [place], [place].    o Dr. Beryle Beams  & Kyung Bacca, NP  ([facility])    o Arvil Persons, MD - [facility] - Adv CHF,      o Had Telemedicine Visit with Illene Labrador. Daubert, MD [facility])   Social Determinants of Health   Financial Resource Strain: Not on file  Food Insecurity: Not on file  Transportation Needs: Not on file  Physical Activity: Not on file  Stress: Not on file  Social Connections: Not on file     Family History: The patient's family history includes Alcohol abuse in his father; Asthma in his brother, mother, and sister; CVA in his mother; Depression in his father; Diabetes Mellitus II in his sister; Hypertension in his brother, mother, and sister; Kidney failure in  his mother.  ROS:   Please see the history of present illness.     All other systems reviewed and are negative.  EKGs/Labs/Other Studies Reviewed:    The following studies were reviewed today:   EKG:  EKG is  ordered today.  The ekg ordered today demonstrates sinus bradycardia, otherwise normal ECG.  Heart rate 58.  Recent Labs: 01/04/2021: B Natriuretic Peptide 25.5 03/07/2021: Magnesium 2.3; TSH 4.140 04/08/2021: ALT 22; BUN 11; Creatinine, Ser 1.47; Hemoglobin 11.9; Platelets 209; Potassium 3.8; Sodium 140  Recent Lipid Panel    Component Value Date/Time   CHOL 149 12/26/2020 0420   TRIG 138 01/12/2021 0401   HDL 36 (L) 12/26/2020 0420   CHOLHDL 4.1 12/26/2020 0420   VLDL 18 12/26/2020 0420   LDLCALC 95 12/26/2020 0420     Risk Assessment/Calculations:        Physical Exam:    VS:  BP 130/66 (BP Location: Right Arm, Patient Position: Sitting, Cuff Size: Normal)   Pulse (!) 58   Ht 5\' 8"  (1.727 m)   SpO2 97%   BMI 37.41 kg/m     Wt Readings from Last 3 Encounters:  02/28/21 246 lb 0.5 oz (111.6 kg)     GEN:  Well nourished, no acute distress HEENT: Poor dentition NECK: No JVD; No carotid bruits CARDIAC: RRR, no murmurs, rubs, gallops RESPIRATORY:  Clear to auscultation without rales, wheezing or rhonchi  ABDOMEN: Soft, non-tender, distended, PEG tube noted MUSCULOSKELETAL: 1+ edema; No deformity  SKIN: Warm and dry NEUROLOGIC:  Alert, left arm weakness. PSYCHIATRIC:  Normal affect   ASSESSMENT:    1. NICM (nonischemic cardiomyopathy) (HCC)   2. Primary hypertension   3. Pure hypercholesterolemia    PLAN:    In order of problems listed above:  Nonischemic cardiomyopathy, last EF normalized at 50 to 55%.  History of hyperkalemia associated with ARB's.  Continue Coreg, Norvasc.  Heart rate 58, patient is adequately beta blocked. Hypertension, BP controlled.  Continue Coreg, Norvasc. Hyperlipidemia, aspirin, statin.  History of CVA.  Follow-up in 6  months.  Patient appears to  lack insight into his current condition.    Medication Adjustments/Labs and Tests Ordered: Current medicines are reviewed at length with the patient today.  Concerns regarding medicines are outlined above.  Orders Placed This Encounter  Procedures   Basic metabolic panel   EKG 12-Lead   No orders of the defined types were placed in this encounter.   Patient Instructions  Medication Instructions:  Your physician recommends that you continue on your current medications as directed. Please refer to the Current Medication list given to you today.  *If you need a refill on your cardiac medications before your next appointment, please call your pharmacy*   Lab Work:  Your physician recommends that you have lab work (BMP) drawn in 2 weeks.  Testing/Procedures: None ordered   Follow-Up: At [facility], you and your health needs are our priority.  As part of our continuing mission to provide you with exceptional heart care, we have created designated Provider Care Teams.  These Care Teams include your primary Cardiologist (physician) and Advanced Practice Providers (APPs -  Physician Assistants and Nurse Practitioners) who all work together to provide you with the care you need, when you need it.  We recommend signing up for the patient portal called "MyChart".  Sign up information is provided on this After Visit Summary.  MyChart is used to connect with patients for Virtual Visits (Telemedicine).  Patients are able to view lab/test results, encounter notes, upcoming appointments, etc.  Non-urgent messages can be sent to your provider as well.   To learn more about what you can do with MyChart, go to [email or web address].    Your next appointment:   6 month(s)  The format for your next appointment:   In Person  Provider:   Debbe Odea, MD   Other Instructions    Signed, Debbe Odea, MD  06/06/2021 3:42 PM    [facility]

## 2021-06-13 ENCOUNTER — Ambulatory Visit: Payer: Medicaid Other | Admitting: Internal Medicine

## 2021-06-14 ENCOUNTER — Ambulatory Visit (INDEPENDENT_AMBULATORY_CARE_PROVIDER_SITE_OTHER): Payer: Medicaid Other | Admitting: Internal Medicine

## 2021-06-14 ENCOUNTER — Telehealth: Payer: Self-pay | Admitting: Internal Medicine

## 2021-06-14 ENCOUNTER — Ambulatory Visit: Payer: Medicaid Other | Admitting: Internal Medicine

## 2021-06-14 ENCOUNTER — Encounter: Payer: Self-pay | Admitting: Internal Medicine

## 2021-06-14 ENCOUNTER — Other Ambulatory Visit: Payer: Self-pay

## 2021-06-14 VITALS — BP 137/79 | HR 64 | Temp 98.0°F

## 2021-06-14 DIAGNOSIS — L988 Other specified disorders of the skin and subcutaneous tissue: Secondary | ICD-10-CM | POA: Diagnosis present

## 2021-06-14 NOTE — Progress Notes (Addendum)
[facility]  Reason for Consult:"infected knee" Referring Provider: Leonia Reader    Patient Active Problem List   Diagnosis Date Noted   Morbid obesity (HCC) 05/14/2021   OSA (obstructive sleep apnea) 05/14/2021   Complete left bundle branch block (LBBB) 05/14/2021   Chronic venous insufficiency -Left Leg > Right. 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 male pmh dm2, dysphagia, Non-ischemic cardiomyopathy nyhc 3 stage c (12/2020 a cardiac arrest event - no icd present), htn, hx covid 19, hx pe/dvt, referred here by pcp for infected knee  I have no paperwork regarding culture/prior w/u. I called dr Curley Spice office at [facility] and then [facility] where patient is staying but unsucessful  He is here today with the nursing home flow sheet and med list; reviewed. Appeared he is on enteral feeding Meds: Amlodipine Atorvastatin Cavedillol Clonazepam Doxy (since 10/12) Famotidine Lasix Keppra Zoloft  I reviewed his recent hospital admission 12/2020-02/2021 for cardiac arrest-vtach/cardiogenic shock, hypoxic respiratory failure/aspriation pna. He underwent tracheostomy/peg placement. Trach remove 6/23. His course was complicated by anoxic brain injury/cva with left paresis  He continues to have peg but is eating by mouth and not via peg.  He wants peg out  He doesn't know about history of his knee. No hardware. I looked at his knee and the joint is fine but inferior to it is a spot of draining sinus tract with purulence  No fever chill  He can stand on his leg fine  He is on doxy per his snf note  Review of Systems: ROS       Past Medical History:  Diagnosis Date   Diabetes mellitus type II, non insulin dependent (HCC)    Essential hypertension [name]   h/o Proteus pneumonia (HCC) 02/17/2021   Heart failure (HCC)    Unknown details.  By report no cardiac surgery   Severe hypoxic-ischemic encephalopathy    Venous stasis dermatitis of left lower extremity     Social History   Tobacco Use   Smoking status: Former    Packs/day: 0.50    Years: 7.00    Pack years: 3.50    Types: Cigarettes    Quit date: 09/11/2018    Years since quitting: 2.7   Smokeless tobacco: Never  Vaping Use   Vaping Use: Never used  Substance Use Topics   Alcohol use: Not Currently   Drug use: Not Currently    Family History  Problem Relation Age of Onset   Asthma Mother    Hypertension Mother        LIkely Severe/Malignant   Kidney failure Mother        ESRD - HD   CVA Mother    Alcohol abuse Father    Depression Father    Asthma Sister    Hypertension Sister    Diabetes Mellitus II Sister  Asthma Brother    Hypertension Brother     Not on File  OBJECTIVE: Vitals:   06/14/21 0940  BP: 137/79  Pulse: 64  Temp: 98 F (36.7 C)  TempSrc: Oral  SpO2: 100%   There is no height or weight on file to calculate BMI.   Physical Exam   General/constitutional: no distress, pleasant HEENT: Normocephalic, PER, Conj Clear, EOMI, Oropharynx clear Neck supple CV: rrr no mrg Lungs: clear to auscultation, normal respiratory effort Abd: Soft, Nontender -- peg tube present Ext: no edema Skin: No Rash Neuro: nonfocal MSK: left sided paresis and flexion contracture; LE edema bilaterally; sinus tract around left  tibial plateau with purulence; nontender; swab dug 2 cm into tract for cx no tenderness Psych: alert/oriented  Central line presence: no  Lab:  Microbiology:  Serology:  Imaging:   Assessment/plan: Problem List Items Addressed This Visit   None Visit Diagnoses     Draining cutaneous sinus tract    -  Primary   Relevant Orders   Aerobic culture   CT TIBIA FIBULA LEFT W WO CONTRAST   CBC   COMPLETE METABOLIC PANEL WITH GFR   C-reactive protein         It appears he has a draining sinus tract with some purulence seen on today 10/25's exam. He has cva and left side paresis unclear if sensation affected, but I was able to get the wound swab very deep into the wound (2 cm without him having tenderness). He was given doxy since 10/12 by his pcp. Will need to r/o osteomyelitis. There is no clinical evidence septic arthritis of the left knee   -stop doxy -aerobic swab cx sent -cbc/cmp/crp -ct scan left tib-fib -follow up in 3 weeks  -sent message to pcp to refer patient for peg removal -- unavailable on epic    Follow-up: Return in about 3 weeks (around 07/05/2021).  Raymondo Band, MD [facility] [phone] pager   [phone] cell 06/14/2021, 9:56 AM

## 2021-06-14 NOTE — Patient Instructions (Signed)
Stop doxycycline antibiotics   Arrange for ct scan of your left leg -- please do in 1-2 weeks  We obtained culture of the knee swab   Follow up with [place] in 3 weeks

## 2021-06-14 NOTE — Telephone Encounter (Signed)
called dr Curley Spice office [place].... patient is at [facility] he was seen by Dr Leonia Reader and referred for infected knee. the  staff don't have dr Curley Spice number, and he is not working there the day i called 10/25   Called [facility] [phone]... unable to speak of care staff for patient  He/his nursing home had cancelled this appointment twice 10/24 and 10/25

## 2021-06-17 LAB — CBC
HCT: 39.4 % (ref 38.5–50.0)
Hemoglobin: 12.4 g/dL — ABNORMAL LOW (ref 13.2–17.1)
MCH: 24.7 pg — ABNORMAL LOW (ref 27.0–33.0)
MCHC: 31.5 g/dL — ABNORMAL LOW (ref 32.0–36.0)
MCV: 78.5 fL — ABNORMAL LOW (ref 80.0–100.0)
MPV: 11.2 fL (ref 7.5–12.5)
Platelets: 218 10*3/uL (ref 140–400)
RBC: 5.02 10*6/uL (ref 4.20–5.80)
RDW: 14.3 % (ref 11.0–15.0)
WBC: 8.9 10*3/uL (ref 3.8–10.8)

## 2021-06-17 LAB — COMPLETE METABOLIC PANEL WITH GFR
AG Ratio: 1 (calc) (ref 1.0–2.5)
ALT: 22 U/L (ref 9–46)
AST: 18 U/L (ref 10–35)
Albumin: 3.8 g/dL (ref 3.6–5.1)
Alkaline phosphatase (APISO): 112 U/L (ref 35–144)
BUN/Creatinine Ratio: 12 (calc) (ref 6–22)
BUN: 16 mg/dL (ref 7–25)
CO2: 25 mmol/L (ref 20–32)
Calcium: 9.6 mg/dL (ref 8.6–10.3)
Chloride: 103 mmol/L (ref 98–110)
Creat: 1.36 mg/dL — ABNORMAL HIGH (ref 0.70–1.35)
Globulin: 3.7 g/dL (calc) (ref 1.9–3.7)
Glucose, Bld: 96 mg/dL (ref 65–99)
Potassium: 4.7 mmol/L (ref 3.5–5.3)
Sodium: 138 mmol/L (ref 135–146)
Total Bilirubin: 0.5 mg/dL (ref 0.2–1.2)
Total Protein: 7.5 g/dL (ref 6.1–8.1)
eGFR: 59 mL/min/{1.73_m2} — ABNORMAL LOW (ref >=60)

## 2021-06-17 LAB — C-REACTIVE PROTEIN: CRP: 30.4 mg/L — ABNORMAL HIGH (ref <8.0)

## 2021-06-17 LAB — AEROBIC CULTURE
MICRO NUMBER:: 12552018
SPECIMEN QUALITY:: ADEQUATE

## 2021-06-23 ENCOUNTER — Ambulatory Visit
Admission: RE | Admit: 2021-06-23 | Discharge: 2021-06-23 | Disposition: A | Discharge status: Home / Self Care | Payer: Medicaid Other | Source: Ambulatory Visit | Attending: Internal Medicine | Admitting: Internal Medicine

## 2021-06-23 ENCOUNTER — Other Ambulatory Visit: Payer: Self-pay

## 2021-06-23 DIAGNOSIS — L988 Other specified disorders of the skin and subcutaneous tissue: Secondary | ICD-10-CM | POA: Diagnosis present

## 2021-06-23 IMAGING — CT CT TIBIA FIBULA *L* W/ CM
3 of 6 series · 7 of 36 positions shown, 8 images · IV contrast (omnipaque)
Comparison: None.

CLINICAL DATA: chronic draining sinus tract tibial plateau, please
look for osteomyelitis tibial plateau and see if you can extend to
the knee joint

EXAM:
CT OF THE LOWER LEFT EXTREMITY WITH CONTRAST
TECHNIQUE: Multidetector CT imaging of the lower left extremity was performed
according to the standard protocol following intravenous contrast
administration.
CONTRAST:  100mL OMNIPAQUE IOHEXOL 300 MG/ML  SOLN

[Series 4: axial bone lfov lower extremity 1.50 ax · axial · 0.33mm/px · z∈[+772,+772]mm · 1 of 580 slices shown, 2 images]
[im 290/580  soft-tissue]
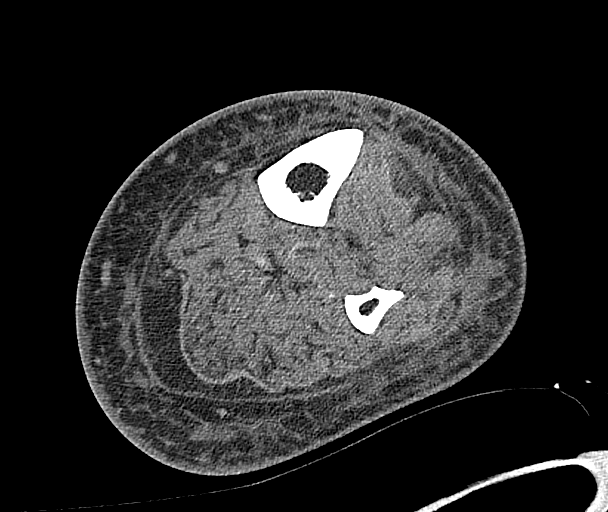
[im 290/580  bone]
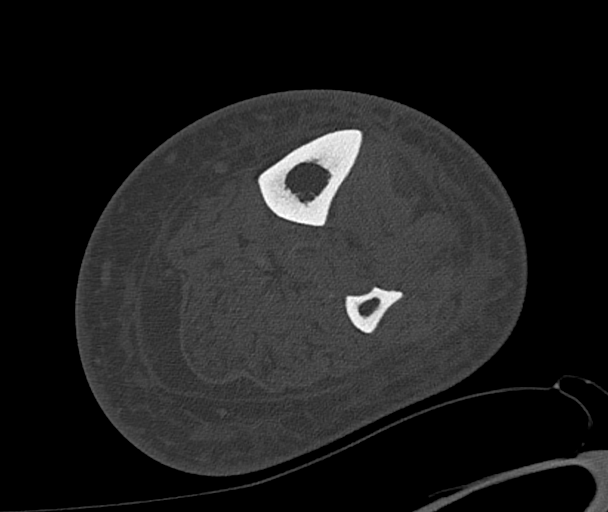

[Series 10: coronal bone lfov lower extremity 1.50 cor · coronal · 0.39mm/px · 1 of 208 slices shown]
[im 104/208  bone]
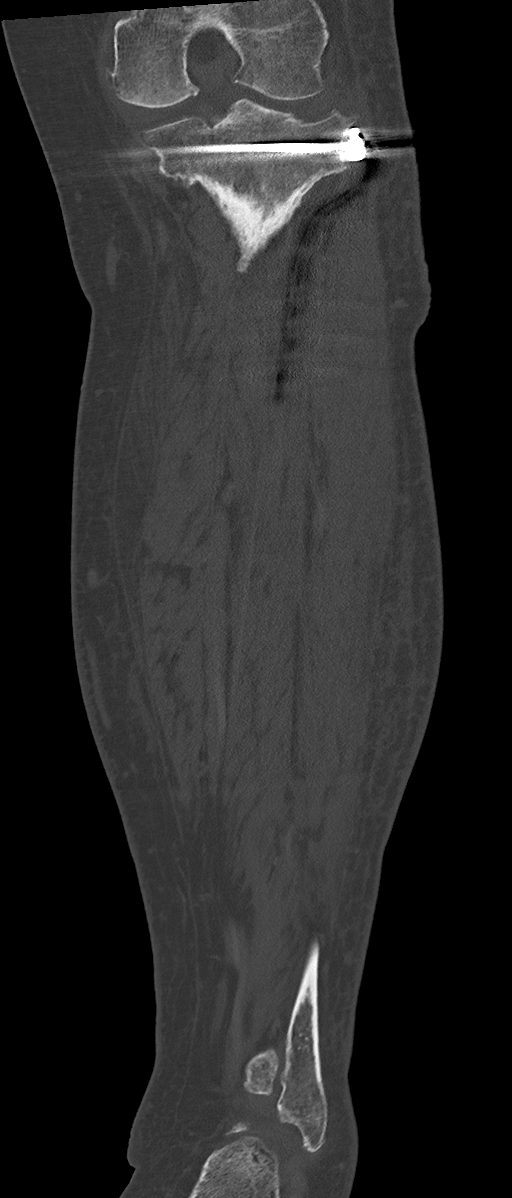

[Series 14: sagittal bone lfov lower extremity 1.50 sag · sagittal · 0.33mm/px · 5 of 247 slices shown]
[im 42/247  bone]
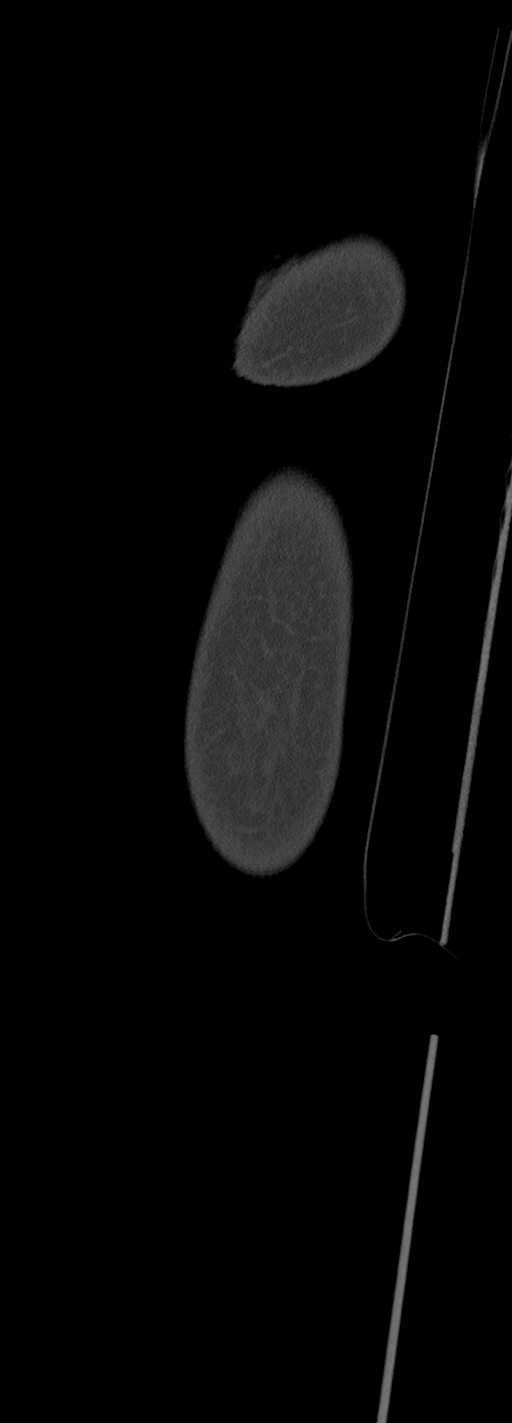
[im 83/247  bone]
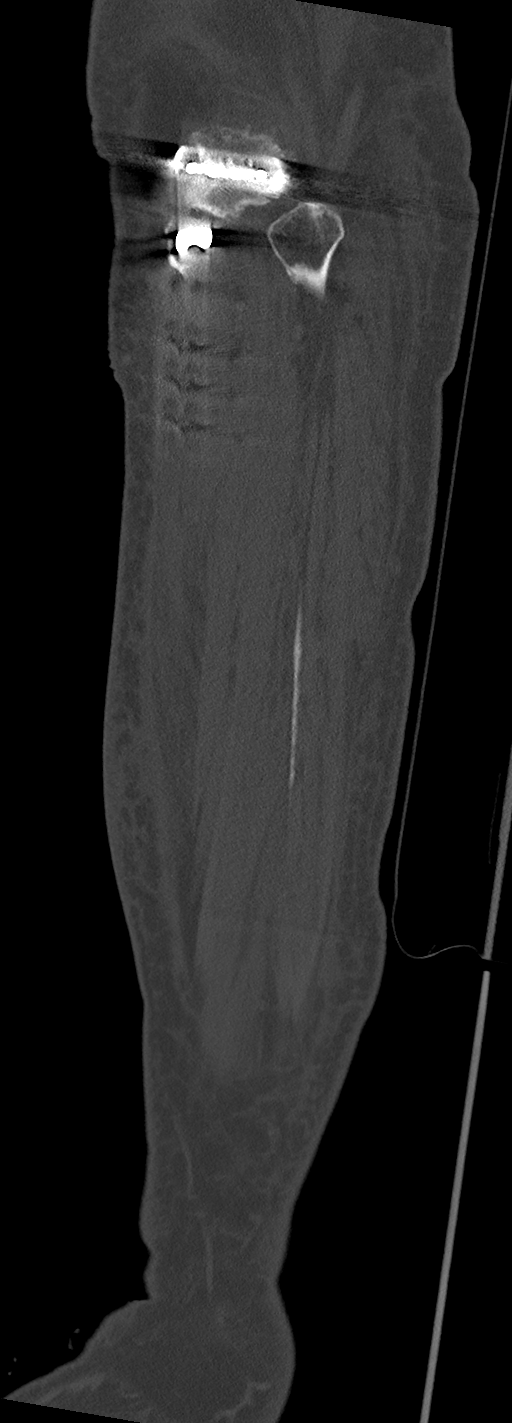
[im 124/247  bone]
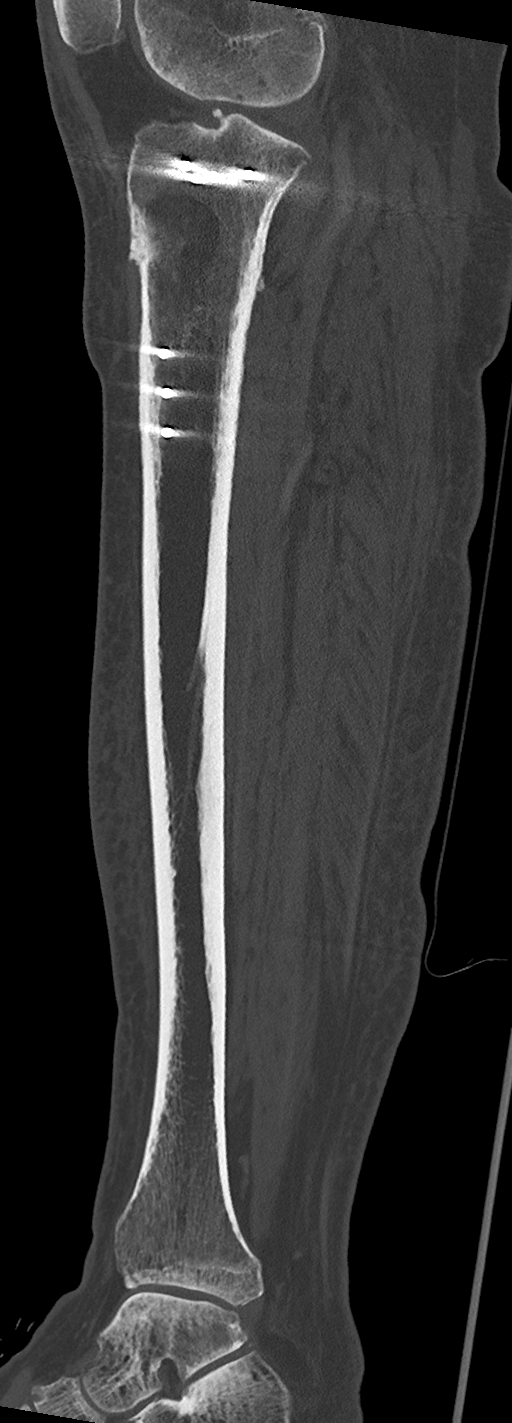
[im 165/247  bone]
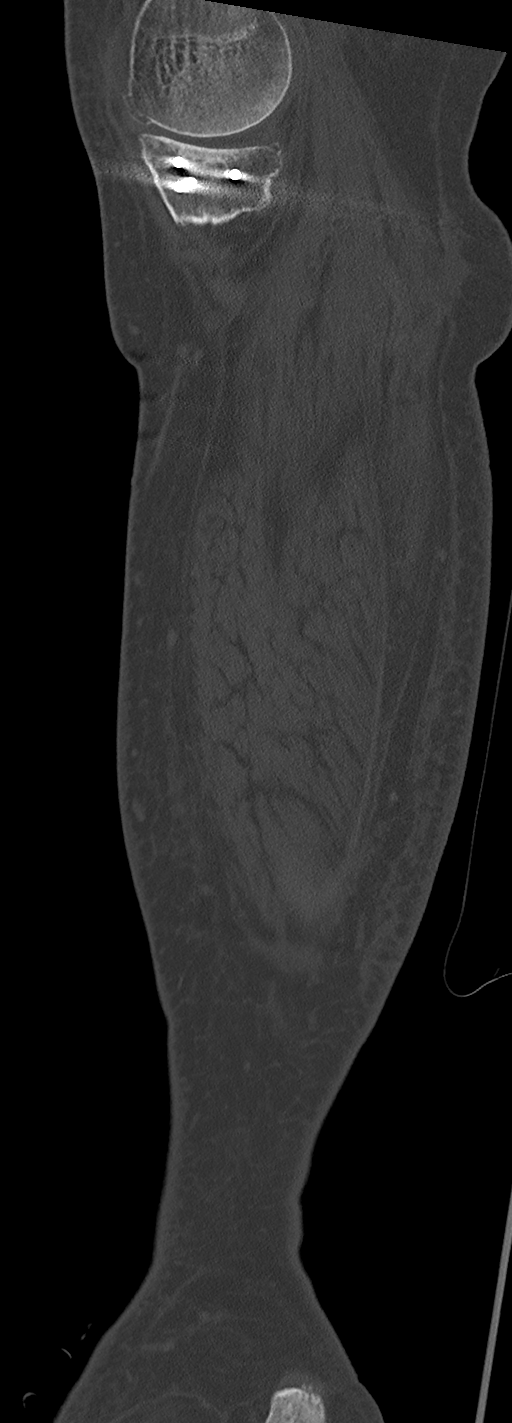
[im 206/247  bone]
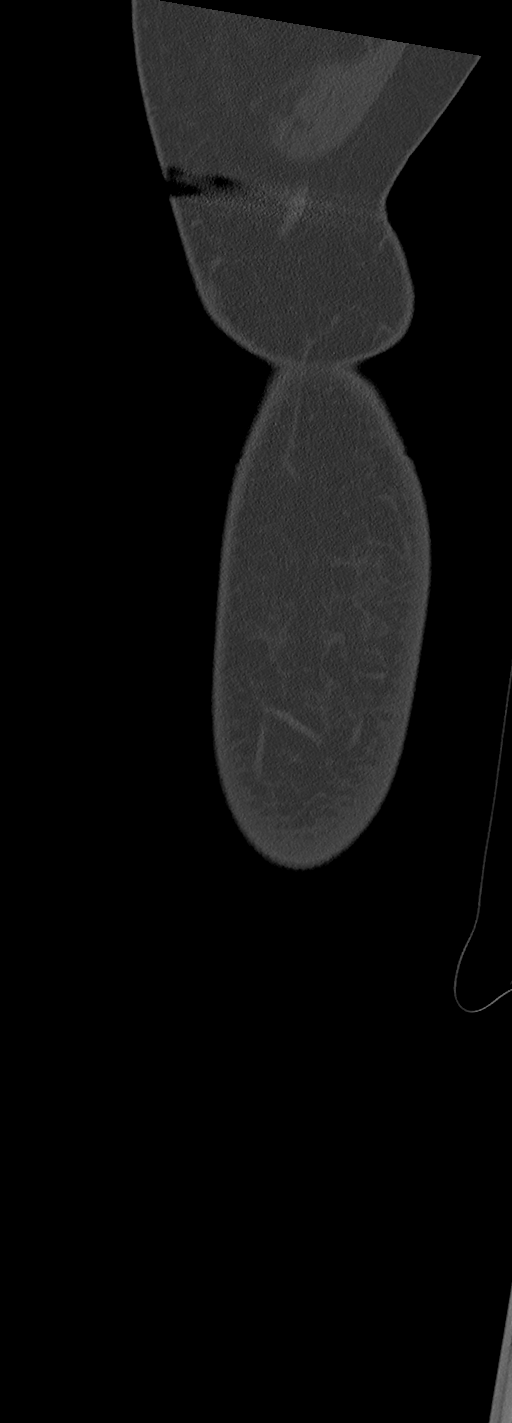

[7 of 36 positions shown; findings below may reference images not displayed]

FINDINGS: Bones/Joint/Cartilage

Prior lateral plate and screw fixation for and lateral tibial
plateau fracture. There is persistent articular surface depression
of the lateral tibial plateau measuring up to 5 mm (series 14, image
145). Hardware is intact without evidence of loosening. There is a
nondisplaced mid fibular diaphyseal fracture which appears to be
essentially healed.

There is a well-defined focal lucent region within the proximal
tibial metaphysis measuring 3.1 x 2.4 x 4.6 cm (series 4, image 97,
series 10, image 75). This has internal fat density and mild
residual mineralization. This has a non sclerotic rim suggesting
this is non-irritating lesion. It has similar density to osteopenia
in the tibial diaphysis. This region is consistent with focal
osteopenia.

There is moderate tricompartment osteoarthritis of the knee. There
is mild-to-moderate tibiotalar arthritis of the ankle.

Ligaments

Suboptimally assessed by CT.

Muscles and Tendons

No intramuscular fluid collection.  Distal gastrocnemius atrophy.

Soft tissues

There is lower extremity skin thickening and subcutaneous swelling
along and skin surface irregularity along the proximal anterolateral
leg on series 4, images 97 and 98, with overlying bandage material.
IMPRESSION: Prior ORIF for a lateral tibial plateau fracture, with persistent 5
mm articular surface depression. Intact hardware without evidence of
loosening. Nondisplaced fibular diaphyseal fracture which appears
essentially healed.

Lower extremity skin thickening and subcutaneous swelling,
suggesting possible cellulitis. Skin surface irregularity along the
anterolateral proximal leg which could represent an open/draining
wound, correlate with exam. No visible soft tissue fluid collection
and no definitive evidence of osteomyelitis by CT. MRI with metal
artifact reduction may be useful if there is persistent clinical
concern.

Well-defined lucent region within the proximal tibial metaphysis
described above is consistent with focal osteopenia.

## 2021-06-23 MED ORDER — IOHEXOL 300 MG/ML  SOLN
100.0000 mL | Freq: Once | INTRAMUSCULAR | Status: AC | PRN
  Administered 2021-06-23: 100 mL via INTRAVENOUS

## 2021-06-24 ENCOUNTER — Telehealth: Payer: Self-pay

## 2021-06-24 ENCOUNTER — Other Ambulatory Visit (HOSPITAL_COMMUNITY): Payer: Self-pay

## 2021-06-24 NOTE — Telephone Encounter (Signed)
Patient is admitted at [facility] (SNF/Rehab) Could not get clinical staff on phone but faxed results from CT along with this note and orders for oral antibiotics to them.   Please advise patient that there is no proof of infection in bone based off of CT results. Patient is scheduled here at [facility] [address] [place] on 07/05/2021 at 10:30 am for follow up with Dr Renold Don.   Faxed Rx order, CT result, and phone note to [phone].([facility])

## 2021-06-24 NOTE — Telephone Encounter (Signed)
-----   Message from Raymondo Band, MD sent at 06/24/2021 12:44 PM EDT ----- Regarding: FW:  Could you let patient know so far the ct acan is not suggestive of active bone infection   However i will need to see him again. Please make sure he follow up next week with me  In the mean time could pharmacy team please rx him cefadroxil for 3 weeks for me to start today  Thanks Gershon Mussel ----- Message ----- From: Leory Plowman, Rad Results In Sent: 06/24/2021  10:06 AM EDT To: Raymondo Band, MD

## 2021-06-24 NOTE — Telephone Encounter (Signed)
Alex signed orders - thanks Cassie!

## 2021-07-05 ENCOUNTER — Ambulatory Visit: Payer: Medicaid Other | Admitting: Internal Medicine

## 2021-07-10 NOTE — Progress Notes (Signed)
This encounter was created in error - please disregard.

## 2021-07-11 ENCOUNTER — Ambulatory Visit: Payer: Medicaid Other | Admitting: Internal Medicine

## 2021-07-18 NOTE — Progress Notes (Deleted)
Patient ID: Jeremy Browning, male    DOB: Dec 13, 1960, 60 y.o.   MRN: [id number]   HPI   Mr Gorrell is a 60 y/o male with a history of DM, HTN, cardiac arrest, CVA, CKD, smoker, and seizure disorder.    Echo report from 03/07/21 reviewed and showed an EF of 60-65% along with mild MR and no LVH.    LHC/RHC done 12/25/20 showed: 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 LV End Diastolic Pressure and Pulmonary Capillary Wedge Pressure are both severely elevated. There is moderate left ventricular systolic dysfunction. The left ventricular ejection fraction is 35-45% by visual estimate. Respiratory acidosis with pH of 7.0 and PCO2 of 93.   Was in the ED 04/08/21 due to seizure thought to be due to recent CVA. Neurology consulted and keppra started & he was released. Admitted 12/25/20 due to shortness of breath with hypoxia with subsequent cardiac arrest. Intubated and then developed VT with resultant cardioversion. Required trach and developed anoxic encephalopathy. Cardiology, neurology, nephrology, palliative care, ENT, GI and orthopaedic referrals done. Needed tube feeding through PEG but then able to stop those as mental status improved. Required pressors while in ICU. Oral HF/ HTN medications adjusted. Brain MRI showed right MCA infarct. Discharged after 73 days.    He presents today for a follow-up visit with a chief complaint of   Past Medical History:  Diagnosis Date   Diabetes mellitus type II, non insulin dependent (HCC)    Essential hypertension [name]   h/o Proteus pneumonia (HCC) 02/17/2021   Heart failure (HCC)    Unknown details.  By report no cardiac surgery   Severe hypoxic-ischemic encephalopathy    Venous stasis dermatitis of left lower extremity    Past Surgical History:  Procedure Laterality Date   LEFT HEART CATH AND CORONARY ANGIOGRAPHY N/A 12/25/2020   Procedure: LEFT HEART CATH AND CORONARY ANGIOGRAPHY;  Surgeon: Marykay Lex, MD;   Location: [facility] INVASIVE CV LAB;  Service: Cardiovascular;  Laterality: N/A; (In setting of Acute Hypoxic and Hypercapnic Respiratory Failure with Flash Pulm Edema/Hypertensive Emergency) - Angiographically minimal CAD.  Moderately reduced LV function with EF of 35 to 45%. LV P-EDP 101/21 mmHg - 30 mmHg   LEFT HEART CATH AND CORONARY ANGIOGRAPHY  09/12/2018   [facility]): Ostial LAD ~20% otherwise normal coronary arteries. LVEDP 20 mmHg;- HTN Emergency related Type II MI>   PEG PLACEMENT N/A 01/11/2021   Procedure: PERCUTANEOUS ENDOSCOPIC GASTROSTOMY (PEG) PLACEMENT;  Surgeon: Midge Minium, MD;  Location: [facility] ENDOSCOPY;  Service: Endoscopy;  Laterality: N/A;   RIGHT HEART CATH N/A 12/25/2020   Procedure: RIGHT HEART CATH;  Surgeon: Marykay Lex, MD;  Location: [facility] INVASIVE CV LAB;  Service: Cardiovascular; R IJ: (Acute Hypoxic and Hypercapnic Respiratory Failure with Flash Pulm Edema/Hypertensive Emergency): CO-CI 6.54-2.82. Mild Secondary Pulmonary Hypertension (PCWP 30 mmHg.  PAP 47/13 mmHg with a mean PAP of 33 mmHg, RAP 22 mmHg in setting of AoP-MAP 97/59 mmHg - 71 mmHg,   TRACHEOSTOMY TUBE PLACEMENT N/A 01/12/2021   Procedure: TRACHEOSTOMY;  Surgeon: Bud Face, MD;  Location: [facility] ORS;  Service: ENT;  Laterality: N/A;   TRANSTHORACIC ECHOCARDIOGRAM  09/11/2018   [facility]: Moderately reduced LVEF ~35%. 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 Mild-mod AI, Trivial MR?TR. LVEDD 6.0 (reduced compared to 2017: EF ~40-45%, LVEDD 5.6   TRANSTHORACIC ECHOCARDIOGRAM  12/27/2020   [facility]: (In Setting of Acute Hypoxic and Hypercapnic Respiratory Failure with Flash Pulm Edema/Hypertensive Emergency)  EF 45-50%. Mildly reduced  LVEF with global HK. Moderately dilated LV with Gr 1 DD - mild LA dilation. Normal RV size Fxn.  Normal Vavles   TRANSTHORACIC ECHOCARDIOGRAM  03/07/2021   [facility]: EF improved to 60-65% with Nl LV Fxn & ~ normal diastolic fxn. Normal RV size & fxn. Mild MR & AI.  Normal  LA size.   Unknown     Family History  Problem Relation Age of Onset   Asthma Mother    Hypertension Mother        LIkely Severe/Malignant   Kidney failure Mother        ESRD - HD   CVA Mother    Alcohol abuse Father    Depression Father    Asthma Sister    Hypertension Sister    Diabetes Mellitus II Sister    Asthma Brother    Hypertension Brother    Social History   Tobacco Use   Smoking status: Former    Packs/day: 0.50    Years: 7.00    Pack years: 3.50    Types: Cigarettes    Quit date: 09/11/2018    Years since quitting: 2.8   Smokeless tobacco: Never  Substance Use Topics   Alcohol use: Not Currently   No Known Allergies    Review of Systems  Constitutional:  Negative for diaphoresis, malaise/fatigue and weight loss.  HENT: Negative.    Eyes: Negative.   Respiratory:  Positive for shortness of breath (with minimal exertion). Negative for cough and wheezing.   Cardiovascular:  Positive for leg swelling (L > R). Negative for chest pain, palpitations and orthopnea.  Gastrointestinal: Negative.   Genitourinary: Negative.   Musculoskeletal:  Negative for back pain and myalgias.  Skin: Negative.   Neurological:  Positive for speech change (dysarthric) and weakness (secondary to CVA). Negative for dizziness and seizures (none since last hospital ED visit).  Endo/Heme/Allergies: Negative.   Psychiatric/Behavioral:  The patient is not nervous/anxious.            Physical Exam Vitals and nursing note reviewed.  Constitutional:      General: He is not in acute distress.    Appearance: He is not diaphoretic.  Neck:     Comments:  - JVD Cardiovascular:     Rate and Rhythm: Normal rate and regular rhythm.     Pulses: Normal pulses.     Heart sounds: Normal heart sounds. No murmur heard.   No gallop.  Pulmonary:     Effort: No respiratory distress.     Breath sounds: Normal breath sounds. No wheezing or rales.  Abdominal:     General: There is no  distension.     Palpations: Abdomen is soft.  Musculoskeletal:        General: Swelling present.     Right lower leg: Edema (+ 2) present.     Left lower leg: Edema (+ 2 left leg grossly larger than right) present.  Skin:    General: Skin is warm.  Neurological:     Mental Status: He is alert and oriented to person, place, and time.     Motor: Weakness present.  Psychiatric:        Judgment: Judgment normal.      Assessment & Plan:   1: Chronic heart failure with preserved ejection fraction without structural changes- - NYHA class III - euvolemic today - he voices adhering to a low sodium diet - order placed for facility to weigh daily and report to HF clinic if wt gain >  2 lbs/day or 5lbs/wk noted  - saw cardiology (Ag[facility]-Etang) 06/06/21 - BNP 01/04/21 was 25.5    2: HTN- - BP  - currently in SNF and sees MD at facility  - BMP 04/2721 (from [facility]) reviewed and showed sodium 141, potassium 4.4, creatinine 1.35 and GFR 65   3: DM- - A1c 02/26/21 was 7.1%   4: Seizure/ stroke- - recent ED visit, started on Keppra, no seizure activity since - ASA 81mg  daily  5: Lymphedema - left leg grossly larger than right - venous doppler ordered by facility on 05/17/21 - he is propping them up when he is stationary for a period of time - BMP ordered by facility on 05/17/21    Medication list brought from facility.

## 2021-07-19 ENCOUNTER — Ambulatory Visit: Payer: Medicaid Other | Admitting: Family

## 2021-07-19 ENCOUNTER — Telehealth: Payer: Self-pay | Admitting: Family

## 2021-07-19 NOTE — Telephone Encounter (Signed)
Patient did not show for his Heart Failure Clinic appointment on 07/19/21 even after confirming with [facility]. Will attempt to reschedule.

## 2021-08-09 ENCOUNTER — Ambulatory Visit (INDEPENDENT_AMBULATORY_CARE_PROVIDER_SITE_OTHER): Payer: Medicaid Other | Admitting: Gastroenterology

## 2021-08-09 ENCOUNTER — Other Ambulatory Visit: Payer: Self-pay

## 2021-08-09 ENCOUNTER — Encounter: Payer: Self-pay | Admitting: Gastroenterology

## 2021-08-09 VITALS — BP 144/79 | HR 66 | Temp 97.3°F

## 2021-08-09 DIAGNOSIS — T85848D Pain due to other internal prosthetic devices, implants and grafts, subsequent encounter: Secondary | ICD-10-CM | POA: Diagnosis not present

## 2021-08-09 NOTE — Progress Notes (Signed)
Primary Care Physician: Crist Fat, MD  Primary Gastroenterologist:  Dr. Midge Minium  Chief Complaint  Patient presents with   Feeding tube check up    HPI: Jeremy Browning is a 60 y.o. male here after having a PEG tube placed by me back in May for dysphagia.  The patient is coming in now with a report of needing the PEG tube removed.  Past Medical History:  Diagnosis Date   Diabetes mellitus type II, non insulin dependent (HCC)    Essential hypertension [name]   h/o Proteus pneumonia (HCC) 02/17/2021   Heart failure (HCC)    Unknown details.  By report no cardiac surgery   Severe hypoxic-ischemic encephalopathy    Venous stasis dermatitis of left lower extremity     Current Outpatient Medications  Medication Sig Dispense Refill   amLODipine (NORVASC) 2.5 MG tablet Take 1 tablet (2.5 mg total) by mouth daily. 30 tablet 11   aspirin EC 81 MG tablet Take 81 mg by mouth daily. Swallow whole.     atorvastatin (LIPITOR) 80 MG tablet Take 80 mg by mouth daily.     carvedilol (COREG) 6.25 MG tablet Take 1 tablet (6.25 mg total) by mouth 2 (two) times daily with a meal. 60 tablet 0   cefadroxil (DURICEF) 500 MG capsule Take 500 mg by mouth 2 (two) times daily.     clonazePAM (KLONOPIN) 0.5 MG tablet Take 0.5 mg by mouth 3 (three) times daily.     diclofenac Sodium (VOLTAREN) 1 % GEL Apply 2 g topically 4 (four) times daily. 100 g 0   docusate sodium (COLACE) 100 MG capsule Take 1 capsule (100 mg total) by mouth 2 (two) times daily. 10 capsule 0   doxycycline (VIBRAMYCIN) 100 MG capsule Take 100 mg by mouth 2 (two) times daily.     famotidine (PEPCID) 20 MG tablet Take 1 tablet (20 mg total) by mouth daily. 30 tablet 0   furosemide (LASIX) 20 MG tablet Take 20 mg by mouth daily.     levETIRAcetam (KEPPRA) 500 MG tablet Take 1 tablet (500 mg total) by mouth 2 (two) times daily. 60 tablet 2   Multiple Vitamin (MULTIVITAMIN WITH MINERALS) TABS tablet Place 1 tablet into feeding  tube daily. 60 tablet 0   Nutritional Supplements (FEEDING SUPPLEMENT, NEPRO CARB STEADY,) LIQD Take 237 mLs by mouth 3 (three) times daily between meals. 10000 mL 0   polyethylene glycol (MIRALAX / GLYCOLAX) 17 g packet Take 17 g by mouth daily. 14 each 0   potassium chloride (KLOR-CON) 10 MEQ tablet Take 10 mEq by mouth daily.     simethicone (MYLICON) 80 MG chewable tablet Chew 1 tablet (80 mg total) by mouth 4 (four) times daily. 30 tablet 0   Water For Irrigation, Sterile (FREE WATER) SOLN Place 30 mLs into feeding tube every 4 (four) hours. 10000 mL 0   ZOLOFT 25 MG tablet Take 25 mg by mouth daily.     No current facility-administered medications for this visit.    Allergies as of 08/09/2021   (No Known Allergies)    ROS:  General: Negative for anorexia, weight loss, fever, chills, fatigue, weakness. ENT: Negative for hoarseness, difficulty swallowing , nasal congestion. CV: Negative for chest pain, angina, palpitations, dyspnea on exertion, peripheral edema.  Respiratory: Negative for dyspnea at rest, dyspnea on exertion, cough, sputum, wheezing.  GI: See history of present illness. GU:  Negative for dysuria, hematuria, urinary incontinence, urinary frequency, nocturnal urination.  Endo: Negative for unusual weight change.    Physical Examination:   BP (!) 144/79    Pulse 66    Temp (!) 97.3 F (36.3 C) (Oral)   General: Well-nourished, well-developed in no acute distress.  Eyes: No icterus. Conjunctivae pink. Abdomen: PEG tube was removed with constant tension placed on the tube until the button deformed and the PEG tube was removed the area was cleaned and draped Psych: Alert and cooperative, normal mood and affect.  Labs:    Imaging Studies: No results found.  Assessment and Plan:   Jeremy Browning is a 60 y.o. y/o male who came in today for removal of a PEG tube.  The patient states he is eating with his right hand since he has left hemiparesis.  He states he is  no longer using the PEG tube.  The area was bandaged.  The patient has been told that the site should close up in the next few days.     Lucilla Lame, MD. Marval Regal    Note: This dictation was prepared with Dragon dictation along with smaller phrase technology. Any transcriptional errors that result from this process are unintentional.

## 2021-08-11 ENCOUNTER — Encounter: Payer: Self-pay | Admitting: Infectious Diseases

## 2021-08-11 ENCOUNTER — Other Ambulatory Visit: Payer: Self-pay

## 2021-08-11 ENCOUNTER — Ambulatory Visit (INDEPENDENT_AMBULATORY_CARE_PROVIDER_SITE_OTHER): Payer: Medicaid Other | Admitting: Infectious Diseases

## 2021-08-11 VITALS — BP 146/79 | HR 60 | Temp 97.7°F | Resp 16 | Wt 218.0 lb

## 2021-08-11 DIAGNOSIS — M8660 Other chronic osteomyelitis, unspecified site: Secondary | ICD-10-CM | POA: Insufficient documentation

## 2021-08-11 DIAGNOSIS — Z5181 Encounter for therapeutic drug level monitoring: Secondary | ICD-10-CM | POA: Diagnosis not present

## 2021-08-11 DIAGNOSIS — L988 Other specified disorders of the skin and subcutaneous tissue: Secondary | ICD-10-CM | POA: Insufficient documentation

## 2021-08-11 NOTE — Progress Notes (Addendum)
Patient Active Problem List   Diagnosis Date Noted   Morbid obesity (HCC) 05/14/2021   OSA (obstructive sleep apnea) 05/14/2021   Complete left bundle branch block (LBBB) 05/14/2021   Chronic venous insufficiency -Left Leg > Right. 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   Essential hypertension 02/16/2021   Pressure injury of skin 02/10/2021   Oropharyngeal dysphagia    HFrEF (heart failure with reduced ejection fraction) [facility])    h/o Cardiopulmonary arrest with successful resuscitation (HCC) 12/25/2020   H/O Acute respiratory failure with hypoxia and hypercapnia (HCC) 12/25/2020   Acute renal failure (HCC) 12/25/2020   Wenckebach second degree AV block 12/25/2020   Delayed surgical wound healing 06/23/2020   Skin ulcer of knee, left, limited to breakdown of skin (HCC) 06/23/2020   BMI 40.0-44.9, adult (HCC) 12/12/2019   Left ventricular dyssynchrony 09/14/2018   Hyperlipidemia 09/13/2018   Tobacco abuse 02/07/2017   Controlled type 2 diabetes mellitus without complication, without long-term current use of insulin (HCC) 06/15/2016   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 30 tablet 11   aspirin EC 81 MG tablet Take 81 mg by mouth daily. Swallow whole.     atorvastatin (LIPITOR) 80 MG tablet Take 80 mg by mouth daily.     carvedilol (COREG) 6.25 MG tablet Take 1 tablet (6.25 mg total) by mouth 2 (two) times daily with a meal. 60 tablet 0   clonazePAM (KLONOPIN) 0.5 MG tablet  Take 0.5 mg by mouth 3 (three) times daily.     diclofenac Sodium (VOLTAREN) 1 % GEL Apply 2 g topically 4 (four) times daily. 100 g 0   doxycycline (VIBRAMYCIN) 100 MG capsule Take 100 mg by mouth 2 (two) times daily.     famotidine (PEPCID) 20 MG tablet Take 1 tablet (20 mg total) by mouth daily. 30 tablet 0   furosemide (LASIX) 20 MG tablet Take 20 mg by mouth daily.     levETIRAcetam (KEPPRA) 500 MG tablet Take 1 tablet (500 mg total) by mouth 2 (two) times daily. 60 tablet 2   Multiple Vitamin (MULTIVITAMIN WITH MINERALS) TABS tablet Place 1 tablet into feeding tube daily. 60 tablet 0   Nutritional Supplements (FEEDING SUPPLEMENT, NEPRO CARB STEADY,) LIQD Take 237 mLs by mouth 3 (three) times daily between meals. 10000 mL 0   potassium chloride (KLOR-CON) 10 MEQ tablet Take 10 mEq by mouth daily.     simethicone (MYLICON) 80 MG chewable tablet Chew 1 tablet (80 mg total) by mouth 4 (four) times daily. 30 tablet 0   ZOLOFT 25 MG tablet Take 25 mg by mouth daily.     cefadroxil (DURICEF) 500 MG capsule Take 500 mg by mouth 2 (two) times daily.     Water For Irrigation, Sterile (FREE WATER) SOLN Place 30 mLs into feeding tube every 4 (four) hours. 10000 mL 0   No current facility-administered medications on file prior to visit.       Subjective: 60 Y O male with PMH of  Obesity/OSA, Chronic Venous Insufficiency, DM2, NICMP, HFrEF ( 12/2020 Cardiac arrest), CVA s/p left sided weakness, HTN, COVID19,  DM, hx PE/DVT , Closed left lateral tibial plateau s/p ORIF with screws and plates who was initially seen by Dr Gale Journey for infected knee where it was noted that he had a draining sinus tract with purulence noted on that visit. Deep wound cx grew group B strep. CRP was elevated at  30. Doxy was stopped which patient was on since 10/12. No clinical evidence of septic arthritis of left knee. CT Left tibia and fibula 11/3 showed    Prior ORIF for a lateral tibial plateau fracture, with persistent 5 mm  articular surface depression. Intact hardware without evidence of loosening. Nondisplaced fibular diaphyseal fracture which appears essentially healed.   Lower extremity skin thickening and subcutaneous swelling, suggesting possible cellulitis. Skin surface irregularity along the anterolateral proximal leg which could represent an open/draining wound, correlate with exam. No visible soft tissue fluid collection and no definitive evidence of osteomyelitis by CT. MRI with metal artifact reduction may be useful if there is persistent clinical concern.   Well-defined lucent region within the proximal tibial metaphysis described above is consistent with focal osteopenia.   Started on cefadroxil 500mg  PO BID for 3 weeks on 11/4. Also had his PEG removed by GI on 12/20  He is in a wheel chair and has a left sided weakness 2/2 prior CVA. He is a poor historian and cannot tell much details. Denies fevers, chills and sweats. Denies nausea, vomiting, abdominal pain and diarrhea. He says the wound in the left knee has been draining on and off for long time. However, he does not have pain/tenderness. His left leg is significanty swollen but no signs of cellulitis. ( He seems to have chronic swelling of the left leg>> rt leg with venous insufficiency, [place] 02/02/21 negative for DVT). He is unsure about the antibiotic. He is unsure about whether he has seen orthopedics. No cefadroxil mentioned in med list from [facility]. Per Facility, cefadroxil was discontinued 4-5 days ago.   Review of Systems: ROS 10 point ROS done with pertinent positives and negatives listed above   Past Medical History:  Diagnosis Date   Diabetes mellitus type II, non insulin dependent ([place])    Essential hypertension [name]   h/o Proteus pneumonia ([place]) 02/17/2021   Heart failure ([place])    Unknown details.  By report no cardiac surgery   Severe hypoxic-ischemic encephalopathy    Venous stasis dermatitis of left lower extremity     Past Surgical History:  Procedure Laterality Date   LEFT HEART CATH AND CORONARY ANGIOGRAPHY N/A 12/25/2020   Procedure: LEFT HEART CATH AND CORONARY ANGIOGRAPHY;  Surgeon: Leonie Man, MD;  Location: [place] [facility];  Service: Cardiovascular;  Laterality: N/A; (In setting of Acute Hypoxic and Hypercapnic Respiratory Failure with Flash Pulm Edema/Hypertensive Emergency) - Angiographically minimal CAD.  Moderately reduced LV function with EF of 35 to 45%. LV P-EDP 101/21 mmHg - 30 mmHg   LEFT HEART CATH AND CORONARY ANGIOGRAPHY  09/12/2018   [facility]): Ostial LAD ~20% otherwise normal coronary arteries. LVEDP 20 mmHg;- HTN Emergency related Type II MI>   PEG PLACEMENT N/A 01/11/2021   Procedure: PERCUTANEOUS ENDOSCOPIC GASTROSTOMY (PEG) PLACEMENT;  Surgeon: Lucilla Lame, MD;  Location: [facility] ENDOSCOPY;  Service: Endoscopy;  Laterality: N/A;   RIGHT HEART CATH N/A 12/25/2020   Procedure: RIGHT HEART CATH;  Surgeon: Leonie Man, MD;  Location: [place] CV LAB;  Service: Cardiovascular; R IJ: (Acute Hypoxic and Hypercapnic Respiratory Failure with Flash Pulm Edema/Hypertensive Emergency): CO-CI 6.54-2.82. Mild Secondary Pulmonary Hypertension (PCWP 30 mmHg.  PAP 47/13 mmHg with a mean PAP of 33 mmHg, RAP 22 mmHg in setting of AoP-MAP 97/59 mmHg - 71 mmHg,   TRACHEOSTOMY TUBE PLACEMENT N/A 01/12/2021   Procedure: TRACHEOSTOMY;  Surgeon: Carloyn Manner, MD;  Location: [facility] ORS;  Service: ENT;  Laterality: N/A;   TRANSTHORACIC ECHOCARDIOGRAM  09/11/2018   [facility]: Moderately reduced LVEF ~35%. 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 Mild-mod AI, Trivial MR?TR. LVEDD 6.0 (reduced compared to 2017: EF ~40-45%, LVEDD 5.6   TRANSTHORACIC ECHOCARDIOGRAM  12/27/2020   [facility]: (In Setting of Acute Hypoxic and Hypercapnic Respiratory Failure with Flash Pulm Edema/Hypertensive Emergency)  EF 45-50%. Mildly reduced LVEF with global HK. Moderately dilated LV with Gr 1 DD - mild LA  dilation. Normal RV size Fxn.  Normal Vavles   TRANSTHORACIC ECHOCARDIOGRAM  03/07/2021   [facility]: EF improved to 60-65% with Nl LV Fxn & ~ normal diastolic fxn. Normal RV size & fxn. Mild MR & AI.  Normal LA size.   Unknown     ' Social History   Tobacco Use   Smoking status: Former    Packs/day: 0.50    Years: 7.00    Pack years: 3.50    Types: Cigarettes    Quit date: 09/11/2018    Years since quitting: 2.9   Smokeless tobacco: Never  Vaping Use   Vaping Use: Never used  Substance Use Topics   Alcohol use: Not Currently   Drug use: Not Currently    Family History  Problem Relation Age of Onset   Asthma Mother    Hypertension Mother        LIkely Severe/Malignant   Kidney failure Mother        ESRD - HD   CVA Mother    Alcohol abuse Father    Depression Father    Asthma Sister    Hypertension Sister    Diabetes Mellitus II Sister    Asthma Brother    Hypertension Brother     No Known Allergies  Health Maintenance  Topic Date Due   FOOT EXAM  Never done   OPHTHALMOLOGY EXAM  Never done   URINE MICROALBUMIN  Never done   TETANUS/TDAP  Never done   COLONOSCOPY (Pts 45-29yrs Insurance coverage will need to be confirmed)  Never done   Zoster Vaccines- Shingrix (1 of 2) Never done   Pneumococcal Vaccine 11-72 Years old (2 - PCV) 06/15/2017   COVID-19 Vaccine (3 - Booster for Pfizer series) 02/13/2020   INFLUENZA VACCINE  03/21/2021   HEMOGLOBIN A1C  08/29/2021   Hepatitis C Screening  Completed   HIV Screening  Completed   HPV VACCINES  Aged Out    Objective: BP (!) 146/79    Pulse 60    Temp 97.7 F (36.5 C)    Resp 16    Wt 218 lb (98.9 kg) Comment: Per report.   SpO2 100%    BMI 33.15 kg/m   Physical Exam Constitutional:      Appearance: Normal appearance.  HENT:     Head: Normocephalic and atraumatic.      Mouth: Mucous membranes are moist. Multiple rotton teeth  Eyes:    Conjunctiva/sclera: Conjunctivae normal.     Pupils: Pupils are equal, round,  and reactive to light.   Cardiovascular:     Rate and Rhythm: Normal rate  and regular rhythm.     Heart sounds:   Pulmonary:     Effort: Pulmonary effort is normal.     Breath sounds: Normal breath sounds.   Abdominal:     General:     Palpations: Abdomen is soft. Prior PEG site is bandaged - clean   Musculoskeletal:        General: Left sided weakness, Left leg swelling>> Rt      Skin:    General: Skin is warm and dry.     Comments:  Neurological:     General: Left sided weakness    Mental Status: awake, alert and oriented to person, place, and time.   Psychiatric:        Mood and Affect: Mood normal. cooperative  Lab Results Lab Results  Component Value Date   WBC 8.9 06/14/2021   HGB 12.4 (L) 06/14/2021   HCT 39.4 06/14/2021   MCV 78.5 (L) 06/14/2021   PLT 218 06/14/2021    Lab Results  Component Value Date   CREATININE 1.36 (H) 06/14/2021   BUN 16 06/14/2021   NA 138 06/14/2021   K 4.7 06/14/2021   CL 103 06/14/2021   CO2 25 06/14/2021    Lab Results  Component Value Date   ALT 22 06/14/2021   AST 18 06/14/2021   ALKPHOS 83 04/08/2021   BILITOT 0.5 06/14/2021    Lab Results  Component Value Date   CHOL 149 12/26/2020   HDL 36 (L) 12/26/2020   LDLCALC 95 12/26/2020   TRIG 138 01/12/2021   CHOLHDL 4.1 12/26/2020   No results found for: LABRPR, RPRTITER No results found for: HIV1RNAQUANT, HIV1RNAVL, CD4TABS  Pertinent Imagings CT left tibia and fibula 06/23/21 IMPRESSION: Prior ORIF for a lateral tibial plateau fracture, with persistent 5 mm articular surface depression. Intact hardware without evidence of loosening. Nondisplaced fibular diaphyseal fracture which appears essentially healed.   Lower extremity skin thickening and subcutaneous swelling, suggesting possible cellulitis. Skin surface irregularity along the anterolateral proximal leg which could represent an open/draining wound, correlate with exam. No visible soft tissue fluid  collection and no definitive evidence of osteomyelitis by CT. MRI with metal artifact reduction may be useful if there is persistent clinical concern.   Well-defined lucent region within the proximal tibial metaphysis described above is consistent with focal osteopenia.    Problem List Items Addressed This Visit       Musculoskeletal and Integument   Draining cutaneous sinus tract - Primary   Relevant Orders   CBC   Comprehensive metabolic panel   C-reactive protein   Sedimentation rate   Ambulatory referral to Orthopedic Surgery   Chronic osteomyelitis (HCC)     Other   Medication monitoring encounter     Assessment/Plan 64 Y O male with PMH of Obesity/OSA, COPD, Chronic Venous Insufficiency, DM2, NICMP , CHFrEF( 12/2020 Cardiac arrest), CVA s/pHTN, COVID19,  DM, hx PE/DVT , Closed left lateral tibial plateau s/p ORIF with screws and plates being followed up for chronic draining sinus tract in the left knee region.   CT 11/4 did not show evidence of osteomyelitis. MRI would be more sensitive for osteomyelitis. However, MRI may not be compatible given his hardware in the left knee. Given his h/o of what seems like a chronic draining sinus tract/chronic wound with an underlying hardware, this would be clinically considered as a chronic osteomyelitis. He still has whitish fluid drain coming out of the sinus when I opened the dressing. It seems he has  not been seen by Orthopedics and will refer him to Ortho for evaluation of his hardware and need for any surgical intervention. In the mean time, will continue cefadroxil 500mg  PO BID for another 3 weeks   He will follow up with Dr Gale Journey in next 2-3 weeks Labs today   Orders Placed This Encounter  Procedures   CBC   Comprehensive metabolic panel   C-reactive protein   Sedimentation rate   Ambulatory referral to Orthopedic Surgery    I have personally spent more than 40  minutes involved in face-to-face and non-face-to-face  activities for this patient on the day of the visit. Professional time spent includes the following activities: Preparing to see the patient (review of tests), Obtaining and/or reviewing separately obtained history (admission/discharge record), Performing a medically appropriate examination and/or evaluation , Ordering medications/tests/procedures, referring and communicating with other health care professionals, Documenting clinical information in the EMR, Independently interpreting results (not separately reported), Communicating results to the patient/family/caregiver, Counseling and educating the patient/family/caregiver and Care coordination (not separately reported).   Jeremy Browning, [facility] 08/11/2021, 8:16 AM

## 2021-08-12 LAB — COMPREHENSIVE METABOLIC PANEL
AG Ratio: 1 (calc) (ref 1.0–2.5)
ALT: 26 U/L (ref 9–46)
AST: 18 U/L (ref 10–35)
Albumin: 3.8 g/dL (ref 3.6–5.1)
Alkaline phosphatase (APISO): 99 U/L (ref 35–144)
BUN/Creatinine Ratio: 13 (calc) (ref 6–22)
BUN: 18 mg/dL (ref 7–25)
CO2: 23 mmol/L (ref 20–32)
Calcium: 9.2 mg/dL (ref 8.6–10.3)
Chloride: 106 mmol/L (ref 98–110)
Creat: 1.39 mg/dL — ABNORMAL HIGH (ref 0.70–1.35)
Globulin: 3.8 g/dL (calc) — ABNORMAL HIGH (ref 1.9–3.7)
Glucose, Bld: 95 mg/dL (ref 65–99)
Potassium: 4.2 mmol/L (ref 3.5–5.3)
Sodium: 139 mmol/L (ref 135–146)
Total Bilirubin: 0.4 mg/dL (ref 0.2–1.2)
Total Protein: 7.6 g/dL (ref 6.1–8.1)

## 2021-08-12 LAB — C-REACTIVE PROTEIN: CRP: 18.7 mg/L — ABNORMAL HIGH (ref <8.0)

## 2021-08-12 LAB — CBC
HCT: 40.8 % (ref 38.5–50.0)
Hemoglobin: 12.7 g/dL — ABNORMAL LOW (ref 13.2–17.1)
MCH: 24.2 pg — ABNORMAL LOW (ref 27.0–33.0)
MCHC: 31.1 g/dL — ABNORMAL LOW (ref 32.0–36.0)
MCV: 77.9 fL — ABNORMAL LOW (ref 80.0–100.0)
MPV: 11.2 fL (ref 7.5–12.5)
Platelets: 223 10*3/uL (ref 140–400)
RBC: 5.24 10*6/uL (ref 4.20–5.80)
RDW: 14.7 % (ref 11.0–15.0)
WBC: 6.9 10*3/uL (ref 3.8–10.8)

## 2021-08-12 LAB — SEDIMENTATION RATE: Sed Rate: 118 mm/h — ABNORMAL HIGH (ref 0–20)

## 2021-08-18 ENCOUNTER — Other Ambulatory Visit: Payer: Self-pay | Admitting: Internal Medicine

## 2021-08-24 ENCOUNTER — Ambulatory Visit (INDEPENDENT_AMBULATORY_CARE_PROVIDER_SITE_OTHER): Payer: Medicaid Other | Admitting: Orthopedic Surgery

## 2021-08-24 ENCOUNTER — Other Ambulatory Visit: Payer: Self-pay

## 2021-08-24 DIAGNOSIS — M009 Pyogenic arthritis, unspecified: Secondary | ICD-10-CM | POA: Diagnosis not present

## 2021-08-26 ENCOUNTER — Encounter: Payer: Self-pay | Admitting: Orthopedic Surgery

## 2021-08-26 NOTE — Progress Notes (Signed)
Office Visit Note   Patient: Jeremy Browning           Date of Birth: September 19, 1960           MRN: [id number] Visit Date: 08/24/2021 Requested by: Odette Fraction, MD [address] [place],  [place] [address] PCP: Crist Fat, MD  Subjective: Chief Complaint  Patient presents with   Left Knee - Pain    HPI: Jeremy Browning is a 61 year old patient with left knee pain.  Patient has multiple medical comorbidities and problems including heart failure with reduced ejection fraction nonischemic cardiomyopathy complete left bundle branch block as well as chronic venous insufficiency in the left leg and history of cardiopulmonary arrest with successful resuscitation.  He also has history of cerebral infarction and acute renal failure.  Patient states that he had tibial plateau fracture fixation years ago at [facility].  He reports drainage out of the proximal tibial region for at least several months..  He is a little bit unclear on this timeframe.  CT scan was performed 2 months ago.  That showed tibial plateau hardware with bone changes consistent with possible chronic osteomyelitis.  He has been on oral antibiotics during this time.  He states "I want my leg cut off".              ROS: All systems reviewed are negative as they relate to the chief complaint within the history of present illness.  Patient denies  fevers or chills.   Assessment & Plan: Visit Diagnoses:  1. Chronic infection of left knee (HCC)     Plan: Impression is complex problem with chronic infection in the left proximal tibial region.  He does have a draining sinus.  I think he likely has chronic osteomyelitis as well.  This is a difficult problem for Jeremy Browning considering all of his medical comorbidities.  I would favor referral to our wound specialist Dr. Lajoyce Corners for further evaluation and management.  He has no knee effusion today so I do not think this is extended into the knee joint itself but I do think there is chronic  infection present which may or may not be amenable to any type of salvage procedure.  Follow-Up Instructions: No follow-ups on file.   Orders:  No orders of the defined types were placed in this encounter.  No orders of the defined types were placed in this encounter.     Procedures: No procedures performed   Clinical Data: No additional findings.  Objective: Vital Signs: There were no vitals taken for this visit.  Physical Exam:   Constitutional: Patient appears well-developed HEENT:  Head: Normocephalic Eyes:EOM are normal Neck: Normal range of motion Cardiovascular: Normal rate Pulmonary/chest: Effort normal Neurologic: Patient is alert Skin: Skin is warm Psychiatric: Patient has normal mood and affect   Ortho Exam: Ortho exam demonstrates diminished pedal pulses bilaterally with chronic venous stasis on that left leg.  He does have a draining fistula in the proximal lateral region of the proximal tibia near the incision.  There is no left knee effusion.  Extensor mechanism is intact.  Slight valgus alignment is present.  There is no erythema fluctuance or induration around this sinus but it does express material and probes deeply with a Q-tip.  I cannot palpate hardware through this draining sinus however.  Specialty Comments:  No specialty comments available.  Imaging: No results found.   PMFS History: Patient Active Problem List   Diagnosis Date Noted   Medication monitoring  encounter 08/11/2021   Draining cutaneous sinus tract 08/11/2021   Chronic osteomyelitis (HCC) 08/11/2021   Morbid obesity (HCC) 05/14/2021   OSA (obstructive sleep apnea) 05/14/2021   Complete left bundle branch block (LBBB) 05/14/2021   Chronic venous insufficiency -Left Leg > Right. 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   Essential hypertension 02/16/2021   Pressure injury of skin 02/10/2021   Oropharyngeal dysphagia    HFrEF (heart failure with reduced ejection fraction) (HCC)     h/o Cardiopulmonary arrest with successful resuscitation (HCC) 12/25/2020   H/O Acute respiratory failure with hypoxia and hypercapnia (HCC) 12/25/2020   Acute renal failure (HCC) 12/25/2020   Wenckebach second degree AV block 12/25/2020   Delayed surgical wound healing 06/23/2020   Skin ulcer of knee, left, limited to breakdown of skin (HCC) 06/23/2020   BMI 40.0-44.9, adult (HCC) 12/12/2019   Left ventricular dyssynchrony 09/14/2018   Hyperlipidemia 09/13/2018   Tobacco abuse 02/07/2017   Controlled type 2 diabetes mellitus without complication, without long-term current use of insulin (HCC) 06/15/2016   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  By report no cardiac surgery   Severe hypoxic-ischemic encephalopathy    Venous stasis dermatitis of left lower extremity     Family History  Problem Relation Age of Onset   Asthma Mother    Hypertension Mother        LIkely Severe/Malignant   Kidney failure Mother        ESRD - HD   CVA Mother    Alcohol abuse Father    Depression Father    Asthma Sister    Hypertension Sister    Diabetes Mellitus II Sister    Asthma Brother    Hypertension Brother     Past Surgical History:  Procedure Laterality Date   LEFT HEART CATH AND CORONARY ANGIOGRAPHY N/A 12/25/2020   Procedure: LEFT HEART CATH AND CORONARY ANGIOGRAPHY;  Surgeon: Marykay Lex, MD;  Location: [facility] INVASIVE CV LAB;   Service: Cardiovascular;  Laterality: N/A; (In setting of Acute Hypoxic and Hypercapnic Respiratory Failure with Flash Pulm Edema/Hypertensive Emergency) - Angiographically minimal CAD.  Moderately reduced LV function with EF of 35 to 45%. LV P-EDP 101/21 mmHg - 30 mmHg   LEFT HEART CATH AND CORONARY ANGIOGRAPHY  09/12/2018   [facility]): Ostial LAD ~20% otherwise normal coronary arteries. LVEDP 20 mmHg;- HTN Emergency related Type II MI>   PEG PLACEMENT N/A 01/11/2021   Procedure: PERCUTANEOUS ENDOSCOPIC GASTROSTOMY (PEG) PLACEMENT;  Surgeon: Midge Minium, MD;  Location: [facility] ENDOSCOPY;  Service: Endoscopy;  Laterality: N/A;   RIGHT HEART CATH N/A 12/25/2020   Procedure: RIGHT HEART CATH;  Surgeon: Marykay Lex, MD;  Location: [facility] INVASIVE CV LAB;  Service: Cardiovascular; R IJ: (Acute Hypoxic and Hypercapnic Respiratory Failure with Flash Pulm Edema/Hypertensive Emergency): CO-CI 6.54-2.82. Mild Secondary Pulmonary Hypertension (PCWP 30 mmHg.  PAP 47/13 mmHg with a mean PAP of 33 mmHg, RAP 22 mmHg in setting of AoP-MAP 97/59  mmHg - 71 mmHg,   TRACHEOSTOMY TUBE PLACEMENT N/A 01/12/2021   Procedure: TRACHEOSTOMY;  Surgeon: Bud Face, MD;  Location: [facility] ORS;  Service: ENT;  Laterality: N/A;   TRANSTHORACIC ECHOCARDIOGRAM  09/11/2018   [facility]: Moderately reduced LVEF ~35%. 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 Mild-mod AI, Trivial MR?TR. LVEDD 6.0 (reduced compared to 2017: EF ~40-45%, LVEDD 5.6   TRANSTHORACIC ECHOCARDIOGRAM  12/27/2020   [facility]: (In Setting of Acute Hypoxic and Hypercapnic Respiratory Failure with Flash Pulm Edema/Hypertensive Emergency)  EF 45-50%. Mildly reduced LVEF with global HK. Moderately dilated LV with Gr 1 DD - mild LA dilation. Normal RV size Fxn.  Normal Vavles   TRANSTHORACIC ECHOCARDIOGRAM  03/07/2021   [facility]: EF improved to 60-65% with Nl LV Fxn & ~ normal diastolic fxn. Normal RV size & fxn. Mild MR & AI.  Normal LA size.   Unknown     Social  History   Occupational History   Not on file  Tobacco Use   Smoking status: Former    Packs/day: 0.50    Years: 7.00    Pack years: 3.50    Types: Cigarettes    Quit date: 09/11/2018    Years since quitting: 2.9   Smokeless tobacco: Never  Vaping Use   Vaping Use: Never used  Substance and Sexual Activity   Alcohol use: Not Currently   Drug use: Not Currently   Sexual activity: Not Currently

## 2021-08-30 ENCOUNTER — Encounter: Payer: Self-pay | Admitting: Family

## 2021-08-30 ENCOUNTER — Ambulatory Visit (INDEPENDENT_AMBULATORY_CARE_PROVIDER_SITE_OTHER): Payer: Medicaid Other | Admitting: Orthopedic Surgery

## 2021-08-30 ENCOUNTER — Other Ambulatory Visit: Payer: Self-pay

## 2021-08-30 DIAGNOSIS — M86462 Chronic osteomyelitis with draining sinus, left tibia and fibula: Secondary | ICD-10-CM

## 2021-08-30 DIAGNOSIS — T847XXS Infection and inflammatory reaction due to other internal orthopedic prosthetic devices, implants and grafts, sequela: Secondary | ICD-10-CM | POA: Diagnosis not present

## 2021-08-30 DIAGNOSIS — M009 Pyogenic arthritis, unspecified: Secondary | ICD-10-CM | POA: Diagnosis not present
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
## 2021-09-01 ENCOUNTER — Ambulatory Visit: Payer: Medicaid Other | Admitting: Internal Medicine

## 2021-09-07 DIAGNOSIS — Z8781 Personal history of (healed) traumatic fracture: Secondary | ICD-10-CM | POA: Insufficient documentation

## 2021-09-07 DIAGNOSIS — Z9889 Other specified postprocedural states: Secondary | ICD-10-CM | POA: Insufficient documentation

## 2021-09-08 ENCOUNTER — Other Ambulatory Visit: Payer: Self-pay

## 2021-09-08 ENCOUNTER — Encounter: Payer: Self-pay | Admitting: Internal Medicine

## 2021-09-08 ENCOUNTER — Ambulatory Visit (INDEPENDENT_AMBULATORY_CARE_PROVIDER_SITE_OTHER): Payer: Medicaid Other | Admitting: Internal Medicine

## 2021-09-08 DIAGNOSIS — Z8781 Personal history of (healed) traumatic fracture: Secondary | ICD-10-CM | POA: Diagnosis not present

## 2021-09-08 DIAGNOSIS — Z9889 Other specified postprocedural states: Secondary | ICD-10-CM | POA: Diagnosis present

## 2021-09-08 DIAGNOSIS — M8660 Other chronic osteomyelitis, unspecified site: Secondary | ICD-10-CM | POA: Diagnosis not present
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
## 2021-09-08 NOTE — Assessment & Plan Note (Signed)
He almost certainly has chronic osteomyelitis associated with his fixation hardware in his tibia.  I agree with Dr. Gavin Potters plan to remove the hardware.  He will need deep bone specimen sent for Gram stain and culture to help guide antibiotic therapy after surgery.  I would recommend keeping him off of antibiotics for now in order to get the most value from operative cultures.  Please schedule him for a follow-up visit here after his upcoming surgery.

## 2021-09-15 NOTE — Progress Notes (Signed)
Patient ID: Jeremy Browning, male    DOB: July 10, 1961, 61 y.o.   MRN: [id number]  HPI  Mr Cambria is a 61 y/o male with a history of DM, HTN, cardiac arrest, CVA, CKD, smoker, and seizure disorder.    Echo report from 03/07/21 reviewed and showed an EF of 60-65% along with mild MR and no LVH.    LHC/RHC done 12/25/20 showed: 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 LV End Diastolic Pressure and Pulmonary Capillary Wedge Pressure are both severely elevated. There is moderate left ventricular systolic dysfunction. The left ventricular ejection fraction is 35-45% by visual estimate. Respiratory acidosis with pH of 7.0 and PCO2 of 93.   Was in the ED 04/08/21 due to seizure thought to be due to recent CVA. Neurology consulted and keppra started & he was released.    He presents today for a follow-up visit with a chief complaint of minimal fatigue upon moderate exertion. Says that this has been chronic in nature. He has associated lower leg swelling and difficulty sleeping (due to roommate). He denies any dizziness, abdominal distention, palpitations, chest pain, shortness of breath, cough or weight gain.   Says that he's getting weighed daily but doesn't know how it's trending. Knows that he's supposed to be having surgery to remove the hardware.  Past Medical History:  Diagnosis Date   Diabetes mellitus type II, non insulin dependent (HCC)    Essential hypertension [name]   h/o Proteus pneumonia (HCC) 02/17/2021   Heart failure (HCC)    Unknown details.  By report no cardiac surgery   Severe hypoxic-ischemic encephalopathy    Venous stasis dermatitis of left lower extremity    Past Surgical History:  Procedure Laterality Date   LEFT HEART CATH AND CORONARY ANGIOGRAPHY N/A 12/25/2020   Procedure: LEFT HEART CATH AND CORONARY ANGIOGRAPHY;  Surgeon: Marykay Lex, MD;  Location: [facility] INVASIVE CV LAB;  Service: Cardiovascular;  Laterality: N/A; (In setting  of Acute Hypoxic and Hypercapnic Respiratory Failure with Flash Pulm Edema/Hypertensive Emergency) - Angiographically minimal CAD.  Moderately reduced LV function with EF of 35 to 45%. LV P-EDP 101/21 mmHg - 30 mmHg   LEFT HEART CATH AND CORONARY ANGIOGRAPHY  09/12/2018   [facility]): Ostial LAD ~20% otherwise normal coronary arteries. LVEDP 20 mmHg;- HTN Emergency related Type II MI>   PEG PLACEMENT N/A 01/11/2021   Procedure: PERCUTANEOUS ENDOSCOPIC GASTROSTOMY (PEG) PLACEMENT;  Surgeon: Midge Minium, MD;  Location: [facility] ENDOSCOPY;  Service: Endoscopy;  Laterality: N/A;   RIGHT HEART CATH N/A 12/25/2020   Procedure: RIGHT HEART CATH;  Surgeon: Marykay Lex, MD;  Location: [facility] INVASIVE CV LAB;  Service: Cardiovascular; R IJ: (Acute Hypoxic and Hypercapnic Respiratory Failure with Flash Pulm Edema/Hypertensive Emergency): CO-CI 6.54-2.82. Mild Secondary Pulmonary Hypertension (PCWP 30 mmHg.  PAP 47/13 mmHg with a mean PAP of 33 mmHg, RAP 22 mmHg in setting of AoP-MAP 97/59 mmHg - 71 mmHg,   TRACHEOSTOMY TUBE PLACEMENT N/A 01/12/2021   Procedure: TRACHEOSTOMY;  Surgeon: Bud Face, MD;  Location: [facility] ORS;  Service: ENT;  Laterality: N/A;   TRANSTHORACIC ECHOCARDIOGRAM  09/11/2018   [facility]: Moderately reduced LVEF ~35%. 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 Mild-mod AI, Trivial MR?TR. LVEDD 6.0 (reduced compared to 2017: EF ~40-45%, LVEDD 5.6   TRANSTHORACIC ECHOCARDIOGRAM  12/27/2020   [facility]: (In Setting of Acute Hypoxic and Hypercapnic Respiratory Failure with Flash Pulm Edema/Hypertensive Emergency)  EF 45-50%. Mildly reduced LVEF with global HK. Moderately dilated LV with  Gr 1 DD - mild LA dilation. Normal RV size Fxn.  Normal Vavles   TRANSTHORACIC ECHOCARDIOGRAM  03/07/2021   [facility]: EF improved to 60-65% with Nl LV Fxn & ~ normal diastolic fxn. Normal RV size & fxn. Mild MR & AI.  Normal LA size.   Unknown     Family History  Problem Relation Age of Onset   Asthma  Mother    Hypertension Mother        LIkely Severe/Malignant   Kidney failure Mother        ESRD - HD   CVA Mother    Alcohol abuse Father    Depression Father    Asthma Sister    Hypertension Sister    Diabetes Mellitus II Sister    Asthma Brother    Hypertension Brother    Social History   Tobacco Use   Smoking status: Former    Packs/day: 0.50    Years: 7.00    Pack years: 3.50    Types: Cigarettes    Quit date: 09/11/2018    Years since quitting: 3.0   Smokeless tobacco: Never  Substance Use Topics   Alcohol use: Not Currently   No Known Allergies Prior to Admission medications   Medication Sig Start Date End Date Taking? Authorizing Provider  aspirin EC 81 MG tablet Take 81 mg by mouth daily. Swallow whole.   Yes [provider]  atorvastatin (LIPITOR) 80 MG tablet Take 80 mg by mouth daily.   Yes [provider]  carvedilol (COREG) 6.25 MG tablet Take 1 tablet (6.25 mg total) by mouth 2 (two) times daily with a meal. 03/08/21  Yes Rolly Salter, MD  clonazePAM (KLONOPIN) 0.5 MG tablet Take 0.5 mg by mouth 2 (two) times daily. 06/01/21  Yes [provider]  diclofenac Sodium (VOLTAREN) 1 % GEL Apply 2 g topically 4 (four) times daily. 03/08/21  Yes Rolly Salter, MD  famotidine (PEPCID) 20 MG tablet Take 1 tablet (20 mg total) by mouth daily. 03/09/21  Yes Rolly Salter, MD  furosemide (LASIX) 20 MG tablet Take 20 mg by mouth daily.   Yes [provider]  levETIRAcetam (KEPPRA) 500 MG tablet Take 1 tablet (500 mg total) by mouth 2 (two) times daily. 04/08/21  Yes Jene Every, MD  Nutritional Supplements (FEEDING SUPPLEMENT, NEPRO CARB STEADY,) LIQD Take 237 mLs by mouth 3 (three) times daily between meals. 03/08/21  Yes Rolly Salter, MD  potassium chloride (KLOR-CON) 10 MEQ tablet Take 10 mEq by mouth daily.   Yes [provider]  simethicone (MYLICON) 80 MG chewable tablet Chew 1 tablet (80 mg total) by mouth 4 (four)  times daily. 03/08/21  Yes Rolly Salter, MD  traZODone (DESYREL) 50 MG tablet Take 25 mg by mouth at bedtime as needed. 09/15/21  Yes [provider]  ZOLOFT 25 MG tablet Take 25 mg by mouth daily. 06/02/21  Yes [provider]  cefadroxil (DURICEF) 500 MG capsule Take 500 mg by mouth 2 (two) times daily. Patient not taking: Reported on 09/08/2021 06/29/21   [provider]  doxycycline (VIBRAMYCIN) 100 MG capsule Take 100 mg by mouth 2 (two) times daily. Patient not taking: Reported on 09/08/2021    [provider]  Multiple Vitamin (MULTIVITAMIN WITH MINERALS) TABS tablet Place 1 tablet into feeding tube daily. Patient not taking: Reported on 09/08/2021 03/09/21   Rolly Salter, MD   Review of Systems  Constitutional:  Positive for fatigue.  Negative for appetite change.  HENT:  Negative for congestion, postnasal drip and sore throat.   Eyes: Negative.   Respiratory:  Negative for cough and shortness of breath.   Cardiovascular:  Positive for leg swelling (lower legs). Negative for chest pain and palpitations.  Gastrointestinal:  Negative for abdominal distention and abdominal pain.  Endocrine: Negative.   Genitourinary: Negative.   Musculoskeletal:  Negative for arthralgias and back pain.  Skin: Negative.   Allergic/Immunologic: Negative.   Neurological:  Negative for dizziness and light-headedness.  Hematological:  Negative for adenopathy. Does not bruise/bleed easily.  Psychiatric/Behavioral:  Positive for sleep disturbance (trouble sleeping due to roommate noise). Negative for dysphoric mood. The patient is not nervous/anxious.    Vitals:   09/16/21 1022  BP: 115/60  Pulse: (!) 55  Resp: 16  SpO2: 100%  Weight: 217 lb (98.4 kg)   Wt Readings from Last 3 Encounters:  09/16/21 217 lb (98.4 kg)  08/11/21 218 lb (98.9 kg)  02/28/21 246 lb 0.5 oz (111.6 kg)   Lab Results  Component Value Date   CREATININE 1.39 (H) 08/11/2021   CREATININE  1.36 (H) 06/14/2021   CREATININE 1.47 (H) 04/08/2021   Physical Exam Vitals and nursing note reviewed.  Constitutional:      Appearance: Normal appearance.  HENT:     Head: Normocephalic and atraumatic.  Cardiovascular:     Rate and Rhythm: Regular rhythm. Bradycardia present.  Pulmonary:     Effort: Pulmonary effort is normal. No respiratory distress.     Breath sounds: No wheezing or rales.  Abdominal:     General: There is no distension.     Palpations: Abdomen is soft.     Tenderness: There is no abdominal tenderness.  Musculoskeletal:        General: No tenderness.     Cervical back: Normal range of motion and neck supple.     Right lower leg: Edema (1+ pitting) present.     Left lower leg: Edema (1+ pitting) present.  Skin:    General: Skin is warm and dry.  Neurological:     Mental Status: He is alert and oriented to person, place, and time. Mental status is at baseline.  Psychiatric:        Mood and Affect: Mood normal.        Behavior: Behavior normal.        Thought Content: Thought content normal.     Assessment & Plan:   1: Chronic heart failure with preserved ejection fraction without structural changes- - NYHA class II - euvolemic today - he voices adhering to a low sodium diet; not adding any salt to his food - weighing daily but he doesn't know the trend; reminded to report to HF clinic if wt gain > 2 lbs/day or 5lbs/wk noted - EF 60-65% - saw cardiology ([facility]ang) 06/06/21 - has order for compression socks on facility [facility] but he says he's not been wearing them; order written for them to put them on every morning with removal at bedtime - BNP 01/04/21 was 25.5   2: HTN- - BP looks good (115/50) - currently in SNF and sees MD at facility  - [facility] 08/11/21 reviewed and showed sodium 139, potassium 4.2, creatinine 1.39 and GFR 59   3: DM- - A1c 02/26/21 was 7.1%   4: Chronic osteomyelitis- - plan to have hardware in left lower leg removed - saw ID  09/08/21 - saw orthopaedics 08/24/21   Facility MAR reviewed.  Due to HF stability, will not make a return appointment for patient at this time. Advised him that he, or the facility, could call back at anytime to make another appointment and he was comfortable with this plan. Reminded him that he needs to continue close follow-up with cardiology

## 2021-09-16 ENCOUNTER — Other Ambulatory Visit: Payer: Self-pay

## 2021-09-16 ENCOUNTER — Encounter: Payer: Self-pay | Admitting: Family

## 2021-09-16 ENCOUNTER — Emergency Department
Admission: EM | Admit: 2021-09-16 | Discharge: 2021-09-16 | Disposition: A | Discharge status: Home / Self Care | Payer: Medicaid Other | Attending: Emergency Medicine | Admitting: Emergency Medicine

## 2021-09-16 ENCOUNTER — Ambulatory Visit: Payer: Medicaid Other | Attending: Family | Admitting: Family

## 2021-09-16 VITALS — BP 115/60 | HR 55 | Resp 16 | Wt 217.0 lb

## 2021-09-16 DIAGNOSIS — M25562 Pain in left knee: Secondary | ICD-10-CM | POA: Insufficient documentation

## 2021-09-16 DIAGNOSIS — G478 Other sleep disorders: Secondary | ICD-10-CM | POA: Diagnosis not present

## 2021-09-16 DIAGNOSIS — I5032 Chronic diastolic (congestive) heart failure: Secondary | ICD-10-CM | POA: Insufficient documentation

## 2021-09-16 DIAGNOSIS — G40909 Epilepsy, unspecified, not intractable, without status epilepticus: Secondary | ICD-10-CM | POA: Insufficient documentation

## 2021-09-16 DIAGNOSIS — G8929 Other chronic pain: Secondary | ICD-10-CM | POA: Diagnosis not present

## 2021-09-16 DIAGNOSIS — Z8249 Family history of ischemic heart disease and other diseases of the circulatory system: Secondary | ICD-10-CM | POA: Diagnosis not present

## 2021-09-16 DIAGNOSIS — Z8673 Personal history of transient ischemic attack (TIA), and cerebral infarction without residual deficits: Secondary | ICD-10-CM | POA: Insufficient documentation

## 2021-09-16 DIAGNOSIS — I10 Essential (primary) hypertension: Secondary | ICD-10-CM | POA: Diagnosis not present

## 2021-09-16 DIAGNOSIS — N189 Chronic kidney disease, unspecified: Secondary | ICD-10-CM | POA: Insufficient documentation

## 2021-09-16 DIAGNOSIS — I130 Hypertensive heart and chronic kidney disease with heart failure and stage 1 through stage 4 chronic kidney disease, or unspecified chronic kidney disease: Secondary | ICD-10-CM | POA: Insufficient documentation

## 2021-09-16 DIAGNOSIS — Z5321 Procedure and treatment not carried out due to patient leaving prior to being seen by health care provider: Secondary | ICD-10-CM | POA: Insufficient documentation

## 2021-09-16 DIAGNOSIS — E8729 Other acidosis: Secondary | ICD-10-CM | POA: Insufficient documentation

## 2021-09-16 DIAGNOSIS — E1122 Type 2 diabetes mellitus with diabetic chronic kidney disease: Secondary | ICD-10-CM | POA: Diagnosis not present

## 2021-09-16 DIAGNOSIS — Z8674 Personal history of sudden cardiac arrest: Secondary | ICD-10-CM | POA: Insufficient documentation

## 2021-09-16 DIAGNOSIS — Z833 Family history of diabetes mellitus: Secondary | ICD-10-CM | POA: Diagnosis not present

## 2021-09-16 DIAGNOSIS — Z87891 Personal history of nicotine dependence: Secondary | ICD-10-CM | POA: Diagnosis not present

## 2021-09-16 DIAGNOSIS — M8660 Other chronic osteomyelitis, unspecified site: Secondary | ICD-10-CM | POA: Diagnosis not present

## 2021-09-16 DIAGNOSIS — N1831 Chronic kidney disease, stage 3a: Secondary | ICD-10-CM

## 2021-09-16 DIAGNOSIS — I2729 Other secondary pulmonary hypertension: Secondary | ICD-10-CM | POA: Diagnosis not present

## 2021-09-16 NOTE — Patient Instructions (Addendum)
Continue weighing daily and call for an overnight weight gain of 3 pounds or more or a weekly weight gain of more than 5 pounds.  ° ° ° °Call us in the future if you need us for anything °
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
## 2021-10-19 ENCOUNTER — Telehealth: Payer: Self-pay | Admitting: Orthopedic Surgery

## 2021-10-19 NOTE — Telephone Encounter (Signed)
Received vm from [facility] w/ [facility] requesting records. I faxed [phone], ph [phone] ?

## 2021-10-20 ENCOUNTER — Telehealth: Payer: Self-pay | Admitting: Orthopedic Surgery

## 2021-10-20 NOTE — Telephone Encounter (Signed)
Received call from [facility] stating our fax of patients records is not going through. I emailed records to her [email or web address]. ph [phone] ?

## 2021-11-06 ENCOUNTER — Emergency Department: Payer: Medicaid Other

## 2021-11-06 ENCOUNTER — Other Ambulatory Visit: Payer: Self-pay

## 2021-11-06 ENCOUNTER — Inpatient Hospital Stay
Admission: EM | Admit: 2021-11-06 | Discharge: 2021-11-08 | DRG: 100 | Disposition: A | Discharge status: Skilled Nursing Facility | Payer: Medicaid Other | Source: Skilled Nursing Facility | Attending: Hospitalist | Admitting: Hospitalist

## 2021-11-06 DIAGNOSIS — J449 Chronic obstructive pulmonary disease, unspecified: Secondary | ICD-10-CM | POA: Diagnosis present

## 2021-11-06 DIAGNOSIS — Z825 Family history of asthma and other chronic lower respiratory diseases: Secondary | ICD-10-CM

## 2021-11-06 DIAGNOSIS — G931 Anoxic brain damage, not elsewhere classified: Secondary | ICD-10-CM | POA: Diagnosis present

## 2021-11-06 DIAGNOSIS — R6510 Systemic inflammatory response syndrome (SIRS) of non-infectious origin without acute organ dysfunction: Secondary | ICD-10-CM

## 2021-11-06 DIAGNOSIS — Z87891 Personal history of nicotine dependence: Secondary | ICD-10-CM

## 2021-11-06 DIAGNOSIS — Z7984 Long term (current) use of oral hypoglycemic drugs: Secondary | ICD-10-CM

## 2021-11-06 DIAGNOSIS — I130 Hypertensive heart and chronic kidney disease with heart failure and stage 1 through stage 4 chronic kidney disease, or unspecified chronic kidney disease: Secondary | ICD-10-CM | POA: Diagnosis present

## 2021-11-06 DIAGNOSIS — Z20822 Contact with and (suspected) exposure to covid-19: Secondary | ICD-10-CM | POA: Diagnosis present

## 2021-11-06 DIAGNOSIS — N1831 Chronic kidney disease, stage 3a: Secondary | ICD-10-CM | POA: Diagnosis present

## 2021-11-06 DIAGNOSIS — Z8249 Family history of ischemic heart disease and other diseases of the circulatory system: Secondary | ICD-10-CM

## 2021-11-06 DIAGNOSIS — Z6831 Body mass index (BMI) 31.0-31.9, adult: Secondary | ICD-10-CM

## 2021-11-06 DIAGNOSIS — R569 Unspecified convulsions: Principal | ICD-10-CM

## 2021-11-06 DIAGNOSIS — I428 Other cardiomyopathies: Secondary | ICD-10-CM | POA: Diagnosis present

## 2021-11-06 DIAGNOSIS — I447 Left bundle-branch block, unspecified: Secondary | ICD-10-CM | POA: Diagnosis present

## 2021-11-06 DIAGNOSIS — I69398 Other sequelae of cerebral infarction: Secondary | ICD-10-CM

## 2021-11-06 DIAGNOSIS — E669 Obesity, unspecified: Secondary | ICD-10-CM | POA: Diagnosis present

## 2021-11-06 DIAGNOSIS — E872 Acidosis, unspecified: Secondary | ICD-10-CM | POA: Diagnosis present

## 2021-11-06 DIAGNOSIS — Z8674 Personal history of sudden cardiac arrest: Secondary | ICD-10-CM

## 2021-11-06 DIAGNOSIS — T8459XA Infection and inflammatory reaction due to other internal joint prosthesis, initial encounter: Secondary | ICD-10-CM | POA: Diagnosis present

## 2021-11-06 DIAGNOSIS — Y792 Prosthetic and other implants, materials and accessory orthopedic devices associated with adverse incidents: Secondary | ICD-10-CM | POA: Diagnosis present

## 2021-11-06 DIAGNOSIS — E119 Type 2 diabetes mellitus without complications: Secondary | ICD-10-CM

## 2021-11-06 DIAGNOSIS — Z818 Family history of other mental and behavioral disorders: Secondary | ICD-10-CM

## 2021-11-06 DIAGNOSIS — Z833 Family history of diabetes mellitus: Secondary | ICD-10-CM

## 2021-11-06 DIAGNOSIS — Z7982 Long term (current) use of aspirin: Secondary | ICD-10-CM

## 2021-11-06 DIAGNOSIS — I5023 Acute on chronic systolic (congestive) heart failure: Secondary | ICD-10-CM | POA: Diagnosis present

## 2021-11-06 DIAGNOSIS — M86462 Chronic osteomyelitis with draining sinus, left tibia and fibula: Secondary | ICD-10-CM | POA: Diagnosis present

## 2021-11-06 DIAGNOSIS — Z823 Family history of stroke: Secondary | ICD-10-CM

## 2021-11-06 DIAGNOSIS — E1169 Type 2 diabetes mellitus with other specified complication: Secondary | ICD-10-CM | POA: Diagnosis present

## 2021-11-06 DIAGNOSIS — I214 Non-ST elevation (NSTEMI) myocardial infarction: Secondary | ICD-10-CM

## 2021-11-06 DIAGNOSIS — G4733 Obstructive sleep apnea (adult) (pediatric): Secondary | ICD-10-CM | POA: Diagnosis present

## 2021-11-06 DIAGNOSIS — E1122 Type 2 diabetes mellitus with diabetic chronic kidney disease: Secondary | ICD-10-CM | POA: Diagnosis present

## 2021-11-06 DIAGNOSIS — Z841 Family history of disorders of kidney and ureter: Secondary | ICD-10-CM

## 2021-11-06 DIAGNOSIS — Y929 Unspecified place or not applicable: Secondary | ICD-10-CM

## 2021-11-06 DIAGNOSIS — I248 Other forms of acute ischemic heart disease: Secondary | ICD-10-CM | POA: Diagnosis present

## 2021-11-06 DIAGNOSIS — Z79899 Other long term (current) drug therapy: Secondary | ICD-10-CM

## 2021-11-06 DIAGNOSIS — I69354 Hemiplegia and hemiparesis following cerebral infarction affecting left non-dominant side: Secondary | ICD-10-CM

## 2021-11-06 DIAGNOSIS — G40909 Epilepsy, unspecified, not intractable, without status epilepticus: Principal | ICD-10-CM | POA: Diagnosis present

## 2021-11-06 DIAGNOSIS — I2729 Other secondary pulmonary hypertension: Secondary | ICD-10-CM | POA: Diagnosis present

## 2021-11-06 DIAGNOSIS — G40919 Epilepsy, unspecified, intractable, without status epilepticus: Secondary | ICD-10-CM

## 2021-11-06 DIAGNOSIS — I5020 Unspecified systolic (congestive) heart failure: Secondary | ICD-10-CM | POA: Diagnosis present

## 2021-11-06 DIAGNOSIS — Z66 Do not resuscitate: Secondary | ICD-10-CM | POA: Diagnosis present

## 2021-11-06 LAB — CBC WITH DIFFERENTIAL/PLATELET
Abs Immature Granulocytes: 0.02 10*3/uL (ref 0.00–0.07)
Basophils Absolute: 0 10*3/uL (ref 0.0–0.1)
Basophils Relative: 0 %
Eosinophils Absolute: 0.4 10*3/uL (ref 0.0–0.5)
Eosinophils Relative: 5 %
HCT: 45.3 % (ref 39.0–52.0)
Hemoglobin: 13.8 g/dL (ref 13.0–17.0)
Immature Granulocytes: 0 %
Lymphocytes Relative: 23 %
Lymphs Abs: 1.7 10*3/uL (ref 0.7–4.0)
MCH: 24.4 pg — ABNORMAL LOW (ref 26.0–34.0)
MCHC: 30.5 g/dL (ref 30.0–36.0)
MCV: 80 fL (ref 80.0–100.0)
Monocytes Absolute: 0.4 10*3/uL (ref 0.1–1.0)
Monocytes Relative: 6 %
Neutro Abs: 4.9 10*3/uL (ref 1.7–7.7)
Neutrophils Relative %: 66 %
Platelets: 208 10*3/uL (ref 150–400)
RBC: 5.66 MIL/uL (ref 4.22–5.81)
RDW: 15 % (ref 11.5–15.5)
WBC: 7.4 10*3/uL (ref 4.0–10.5)
nRBC: 0 % (ref 0.0–0.2)

## 2021-11-06 LAB — CBG MONITORING, ED: Glucose-Capillary: 148 mg/dL — ABNORMAL HIGH (ref 70–99)

## 2021-11-06 LAB — APTT: aPTT: 31 seconds (ref 24–36)

## 2021-11-06 LAB — PROTIME-INR
INR: 1.2 (ref 0.8–1.2)
Prothrombin Time: 15.3 seconds — ABNORMAL HIGH (ref 11.4–15.2)

## 2021-11-06 LAB — LACTIC ACID, PLASMA: Lactic Acid, Venous: 5.7 mmol/L (ref 0.5–1.9)

## 2021-11-06 IMAGING — DX DG CHEST 1V PORT
1 series · 1 of 1 positions shown · non-contrast
Comparison: [DATE]

CLINICAL DATA: Questionable sepsis - evaluate for abnormality

EXAM:
PORTABLE CHEST 1 VIEW

[chest ap]
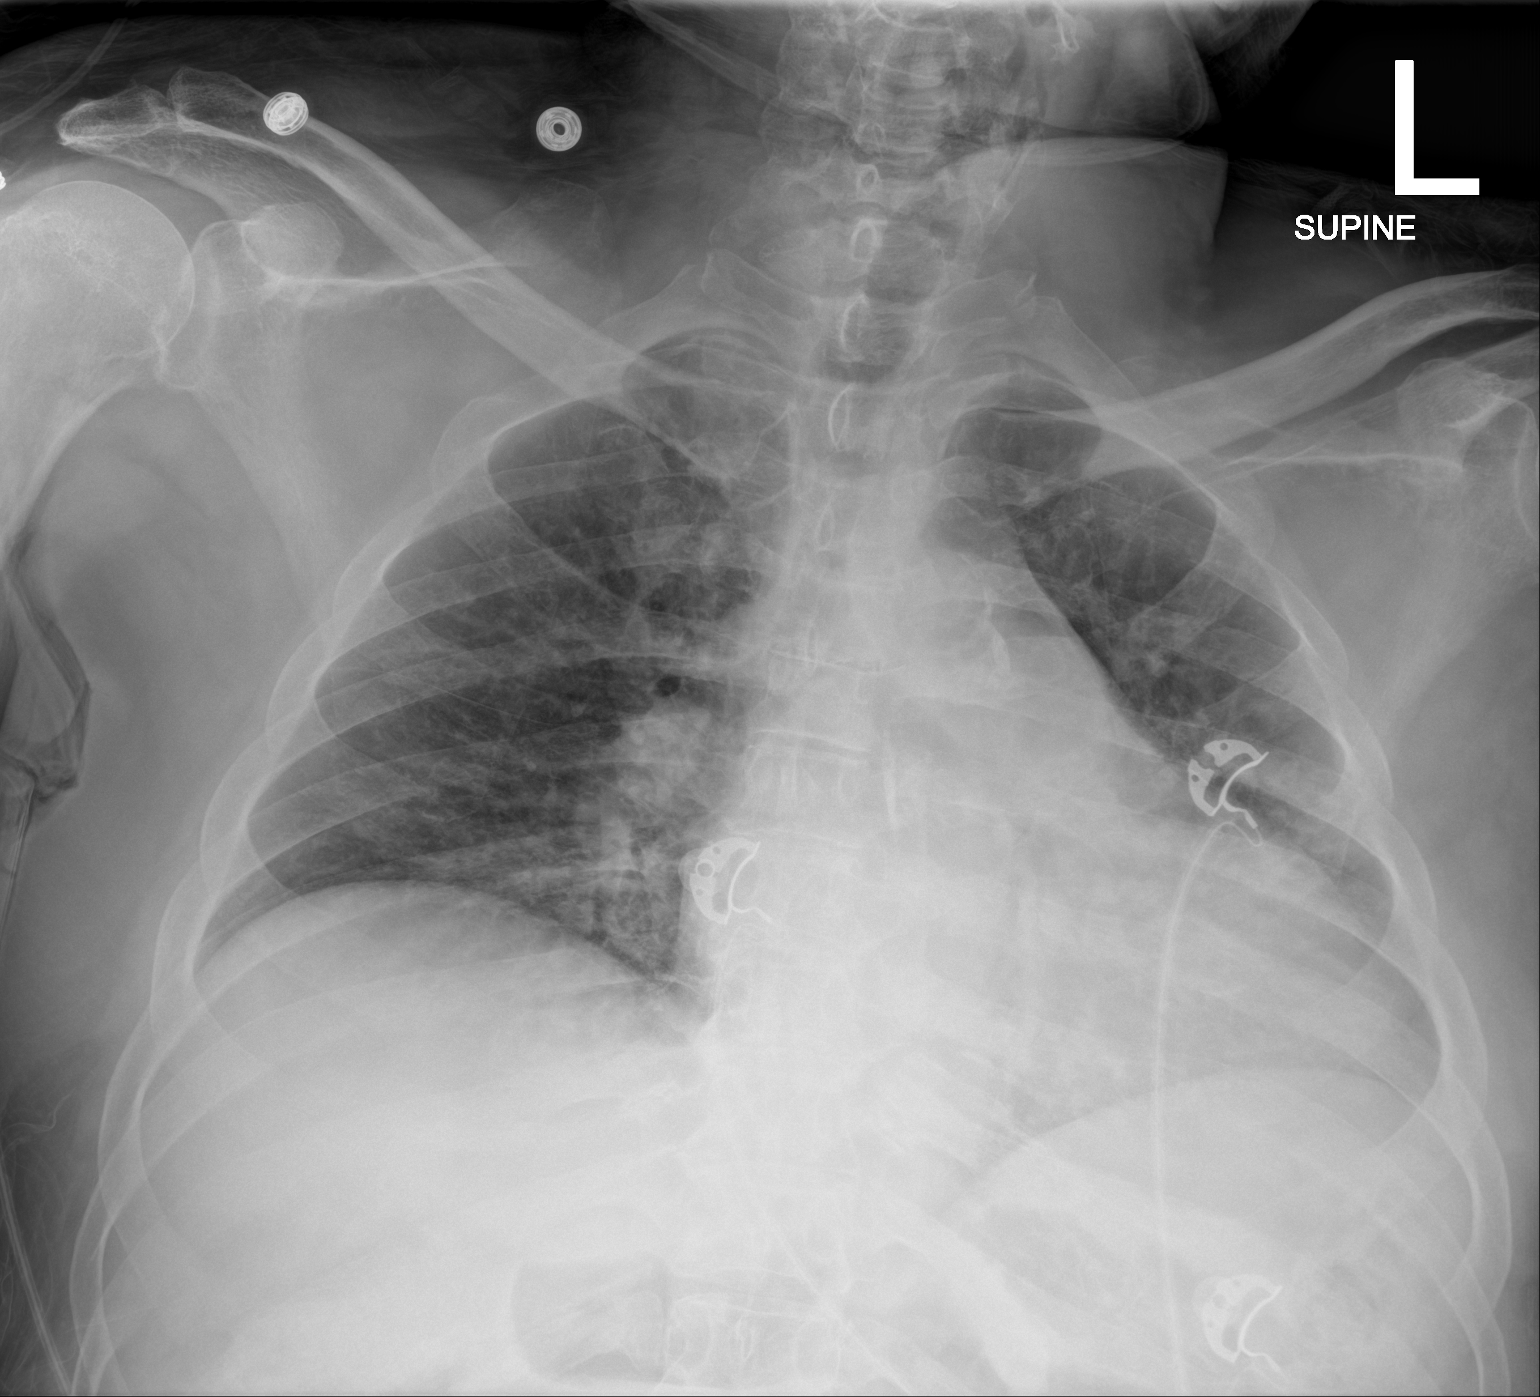

[1 of 1 positions shown; findings below may reference images not displayed]

FINDINGS: Lung volumes are low. Stable cardiomegaly. Aortic atherosclerosis.
Unchanged mediastinal contours. Hypoventilatory changes in the lung
bases without confluent airspace disease no pleural effusion. No
pneumothorax. No acute osseous findings.
IMPRESSION: Low lung volumes with bibasilar atelectasis.

## 2021-11-06 IMAGING — CT CT HEAD W/O CM
4 series · 17 of 47 positions shown, 19 images · non-contrast
Comparison: None.

CLINICAL DATA: Encephalopathy



[Series 2: head wo · axial · 0.48mm/px · z∈[-208,-88]mm · 7 of 34 slices shown, 9 images]
[im 5/34  brain]
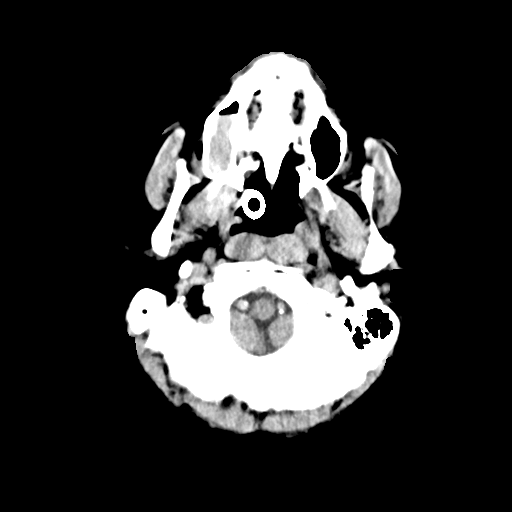
[im 5/34  bone]
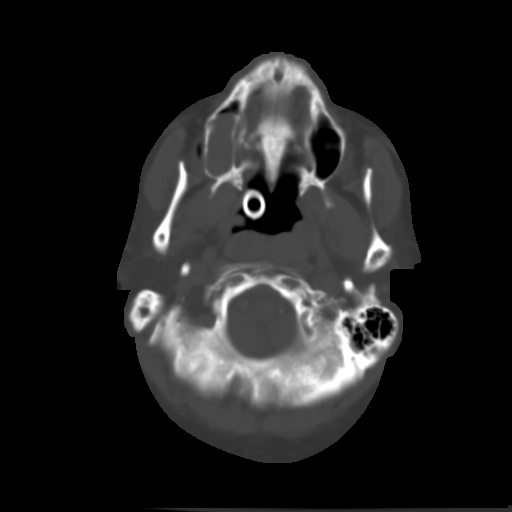
[im 9/34  brain]
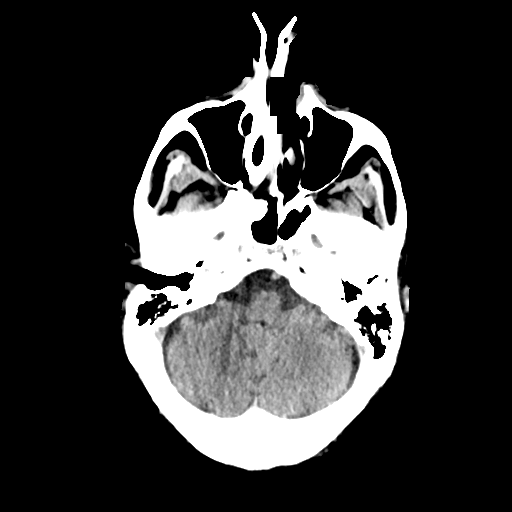
[im 13/34  brain]
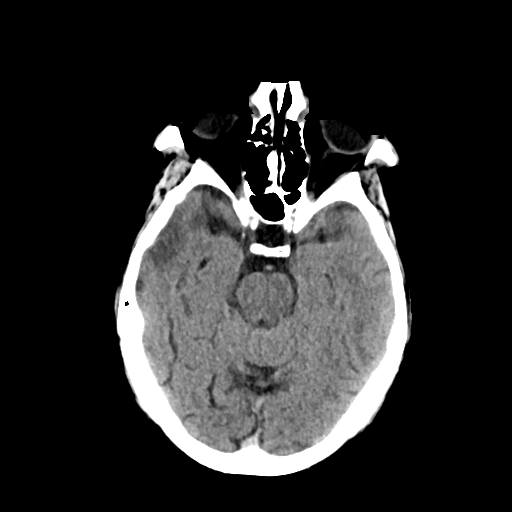
[im 17/34  brain]
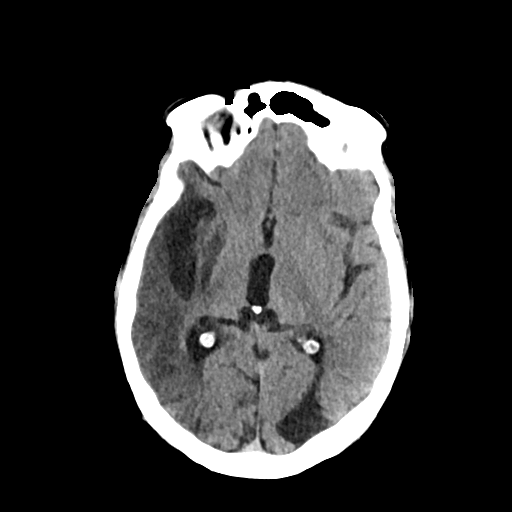
[im 21/34  brain]
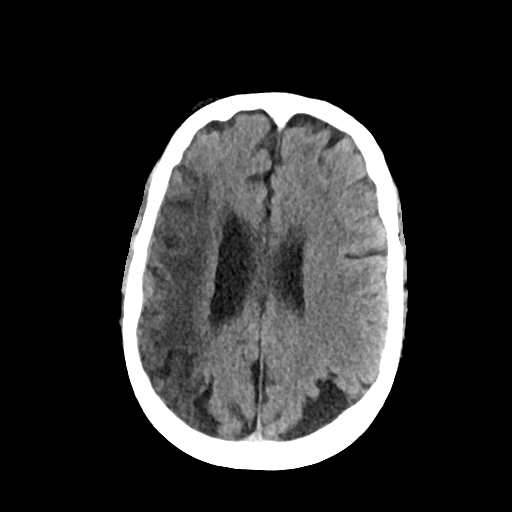
[im 21/34  bone]
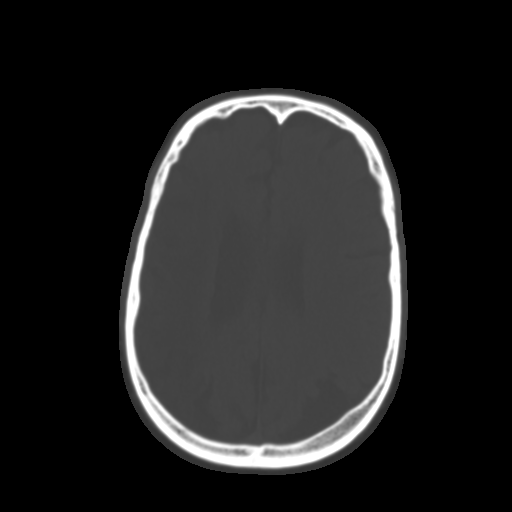
[im 25/34  brain]
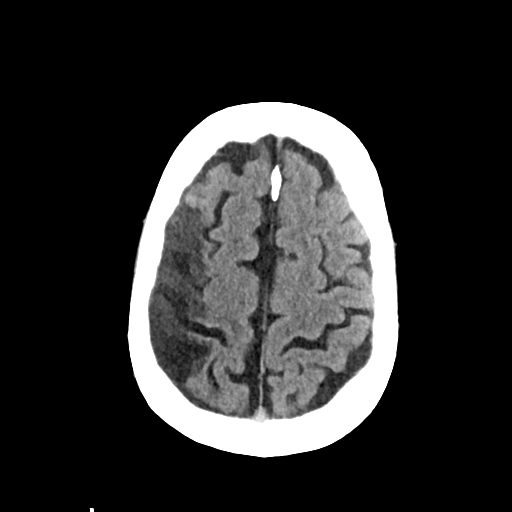
[im 29/34  brain]
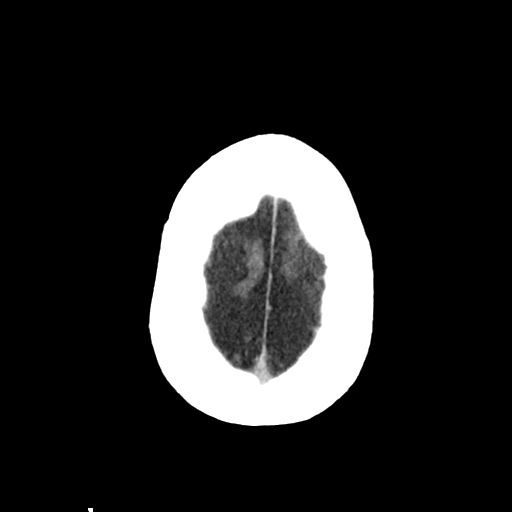

[Series 3: head bone · axial · 0.48mm/px · z∈[-212,-154]mm · 4 of 84 slices shown]
[im 9/84  bone]
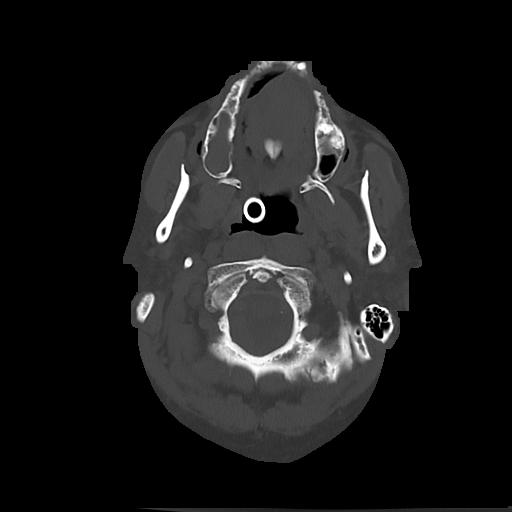
[im 17/84  bone]
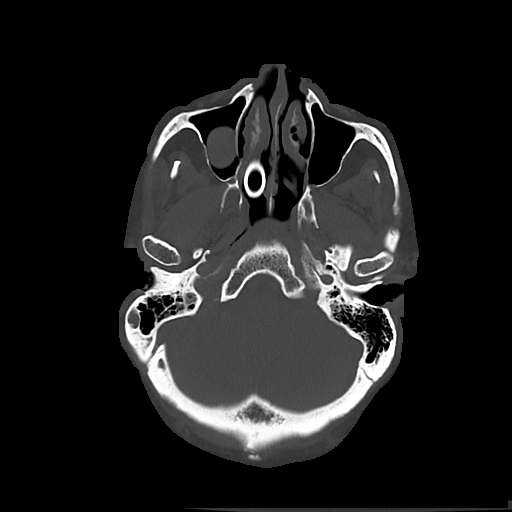
[im 25/84  bone]
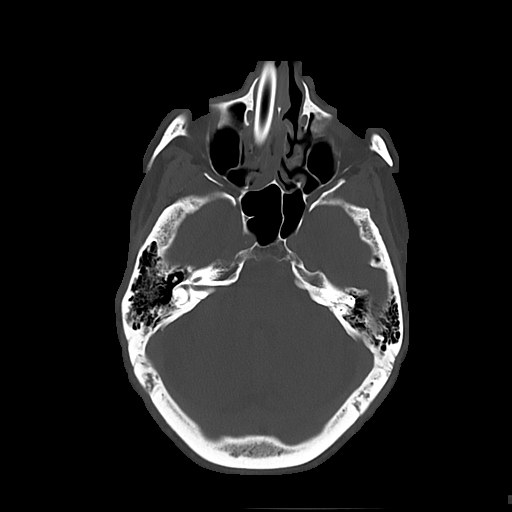
[im 38/84  bone]
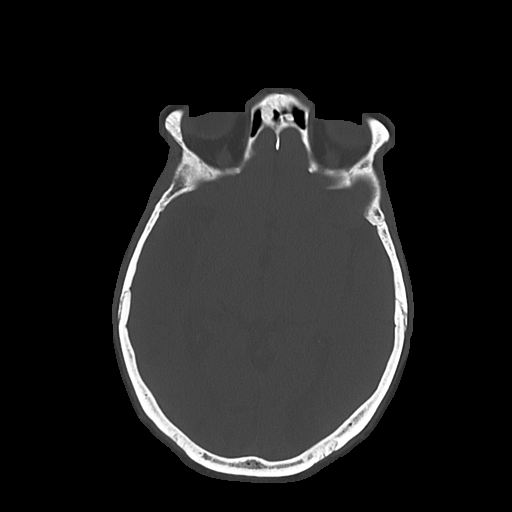

[Series 4: cor soft · coronal · 0.36mm/px · 3 of 68 slices shown]
[im 23/68  brain]
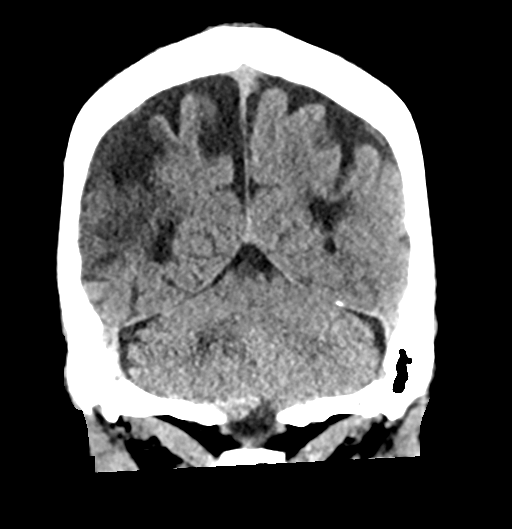
[im 30/68  brain]
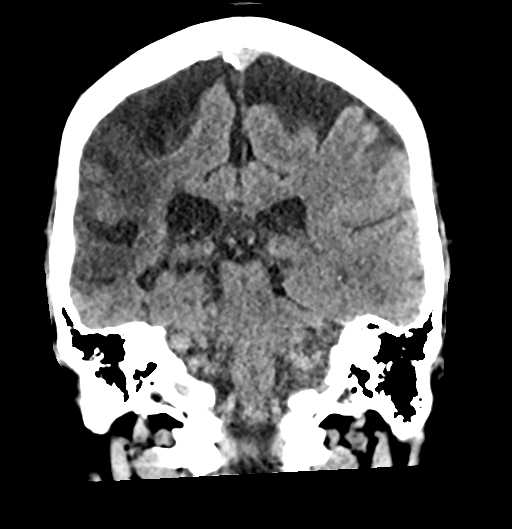
[im 38/68  brain]
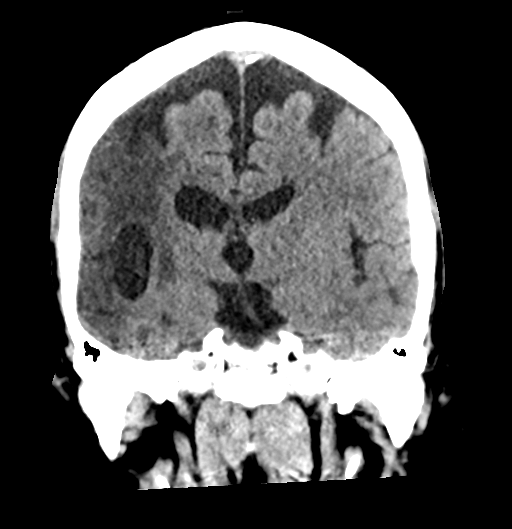

[Series 5: sag soft · sagittal · 0.37mm/px · 3 of 55 slices shown]
[im 19/55  brain]
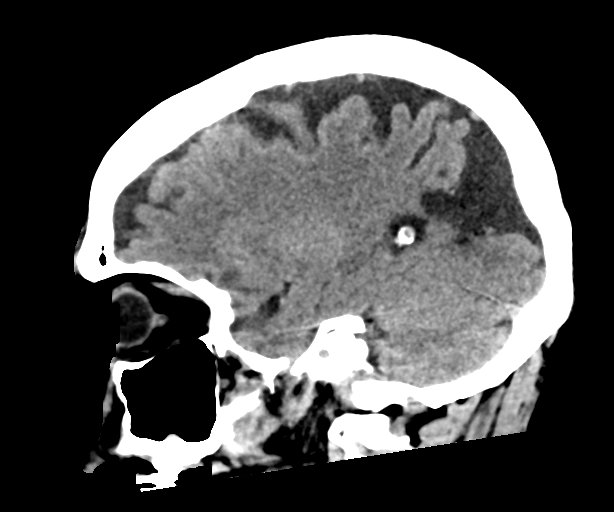
[im 28/55  brain]
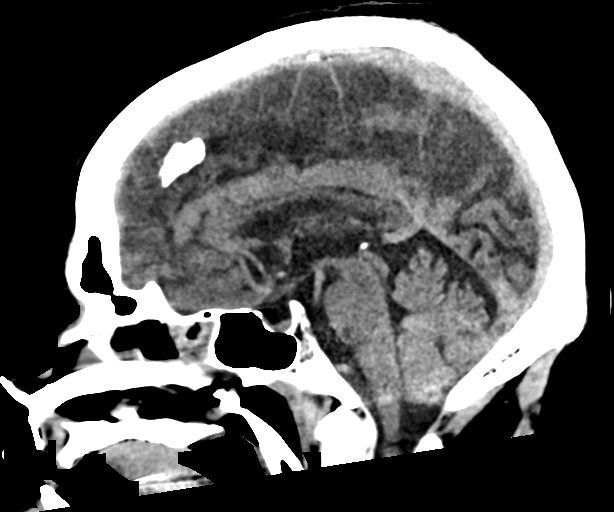
[im 37/55  brain]
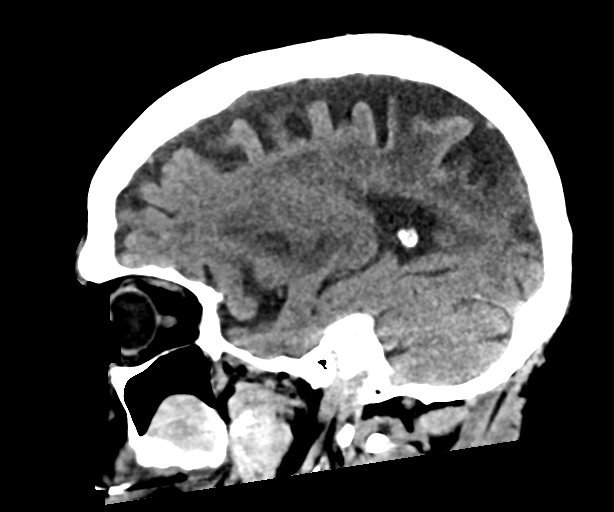

[17 of 47 positions shown; findings below may reference images not displayed]

FINDINGS: Brain: Progressive encephalomalacia within the right MCA territory,
at the site of old infarct. Mild ex vacuo dilatation of the right
lateral ventricle. Old left occipital infarct. No hemorrhage or
other acute abnormality.

Vascular: No abnormal hyperdensity of the major intracranial
arteries or dural venous sinuses. No intracranial atherosclerosis.

Skull: The visualized skull base, calvarium and extracranial soft
tissues are normal.

Sinuses/Orbits: No fluid levels or advanced mucosal thickening of
the visualized paranasal sinuses. No mastoid or middle ear effusion.
The orbits are normal.
IMPRESSION: 1. No acute intracranial abnormality.
2. Progressive encephalomalacia within the right MCA territory, at
the site of old infarct.

## 2021-11-06 MED ORDER — LORAZEPAM 2 MG/ML IJ SOLN
2.0000 mg | Freq: Once | INTRAMUSCULAR | Status: AC
  Administered 2021-11-06: 2 mg via INTRAVENOUS
  Filled 2021-11-06: qty 1

## 2021-11-06 MED ORDER — LACTATED RINGERS IV BOLUS
1000.0000 mL | Freq: Once | INTRAVENOUS | Status: AC
  Administered 2021-11-06: 1000 mL via INTRAVENOUS

## 2021-11-06 MED ORDER — LORAZEPAM 2 MG/ML IJ SOLN
2.0000 mg | Freq: Once | INTRAMUSCULAR | Status: AC
  Administered 2021-11-06: 2 mg via INTRAVENOUS

## 2021-11-06 MED ORDER — LEVETIRACETAM IN NACL 1500 MG/100ML IV SOLN
1500.0000 mg | Freq: Once | INTRAVENOUS | Status: AC
  Administered 2021-11-06: 1500 mg via INTRAVENOUS
  Filled 2021-11-06: qty 100

## 2021-11-06 NOTE — ED Notes (Signed)
Patient transported to CT accompanied by nurse.  ?

## 2021-11-06 NOTE — ED Notes (Signed)
XR at bedside

## 2021-11-06 NOTE — ED Provider Notes (Signed)
? ?[facility] ?Provider Note ? ? ? Event Date/Time  ? First MD Initiated Contact with Patient 11/06/21 2304   ?  (approximate) ? ? ?History  ? ?Seizures ? ? ?HPI ? ?Cliff Damiani is a 61 y.o. male with a past medical history of chronic osteomyelitis of the left knee not currently on antibiotics per ID recommendation with plan to have hardware in the left tibia removed later this month by orthopedic service, DM, HTN, CHF, hypoxic ischemic encephalopathy with residual left-sided weakness bedbound at baseline, CKD, HDL, depression, BPH, anemia, and seizure disorder who presents via EMS from [facility] for evaluation of seizure.  On EMS arrival patient was demonstrating seizure-like activity.  An IO was placed and patient was given 5 mg of IM Versed and 160 mg of Tylenol.  He was reportedly noted to have a temperature of 100.1 with EMS.  Seizure activity ceased patient was transported with an nasopharyngeal airway in place and nasal cannula as he was noted to have low oxygen levels around 70% on their arrival..  On arrival patient is nonverbal and unable provide any additional history. ? ?I was alerted staff at [facility] who confirmed that patient has no seizure disorder history and has not missed any Keppra.  They noted seizure activity when they went to check routine vital signs.  This evening prior to EMS being called.  No other recent injuries or falls or sick symptoms.  No other history is immediately available from staff at facility. ? ?  ?Past Medical History:  ?Diagnosis Date  ? Diabetes mellitus type II, non insulin dependent (HCC)   ? Essential hypertension Irving Burton like  ? h/o Proteus pneumonia (HCC) 02/17/2021  ? Heart failure (HCC)   ? Unknown details.  By report no cardiac surgery  ? Severe hypoxic-ischemic encephalopathy   ? Venous stasis dermatitis of left lower extremity   ? ? ? ?Physical Exam  ?Triage Vital Signs: ?ED Triage Vitals [11/06/21 2300]  ?Enc Vitals  Group  ?   BP   ?   Pulse Rate (!) 115  ?   Resp   ?   Temp   ?   Temp src   ?   SpO2 96 %  ?   Weight   ?   Height   ?   Head Circumference   ?   Peak Flow   ?   Pain Score   ?   Pain Loc   ?   Pain Edu?   ?   Excl. in GC?   ? ? ?Most recent vital signs: ?Vitals:  ? 11/07/21 0400 11/07/21 0430  ?BP: 140/63 (!) 150/65  ?Pulse: 76 77  ?Resp: 18 15  ?Temp:    ?SpO2: 97% 97%  ? ? ?General: Toxic appearing.  ?CV:  Tachycardic.  No significant murmur.  2+ radial pulses. ?Resp:   Slightly tachypneic ?Abd:  No distention.  Soft throughout. ?Other:  Patient's withdrawing his toes to noxious stimuli but does not withdrawal his upper extremities.  He has some lateral gaze deviation and very subtle left lateral nystagmus.  Hypotonic.  Minimal lower extremity edema. ? ?There is a wound in the left lower leg just distal to the knee on the lateral aspect draining some purulent fluid.  No significant surrounding skin changes. ? ? ?ED Results / Procedures / Treatments  ?Labs ?(all labs ordered are listed, but only abnormal results are displayed) ?Labs Reviewed  ?LACTIC ACID, PLASMA - Abnormal; Notable for  the following components:  ?    Result Value  ? Lactic Acid, Venous 5.7 (*)   ? All other components within normal limits  ?LACTIC ACID, PLASMA - Abnormal; Notable for the following components:  ? Lactic Acid, Venous 2.7 (*)   ? All other components within normal limits  ?COMPREHENSIVE METABOLIC PANEL - Abnormal; Notable for the following components:  ? CO2 21 (*)   ? Glucose, Bld 171 (*)   ? Creatinine, Ser 1.63 (*)   ? Calcium 8.7 (*)   ? Albumin 3.4 (*)   ? Total Bilirubin 1.4 (*)   ? GFR, Estimated 48 (*)   ? All other components within normal limits  ?CBC WITH DIFFERENTIAL/PLATELET - Abnormal; Notable for the following components:  ? MCH 24.4 (*)   ? All other components within normal limits  ?PROTIME-INR - Abnormal; Notable for the following components:  ? Prothrombin Time 15.3 (*)   ? All other components within normal  limits  ?URINALYSIS, COMPLETE (UACMP) WITH MICROSCOPIC - Abnormal; Notable for the following components:  ? Color, Urine YELLOW (*)   ? APPearance HAZY (*)   ? Hgb urine dipstick SMALL (*)   ? Protein, ur 100 (*)   ? All other components within normal limits  ?TSH - Abnormal; Notable for the following components:  ? TSH 4.654 (*)   ? All other components within normal limits  ?SEDIMENTATION RATE - Abnormal; Notable for the following components:  ? Sed Rate 23 (*)   ? All other components within normal limits  ?AMMONIA - Abnormal; Notable for the following components:  ? Ammonia 36 (*)   ? All other components within normal limits  ?CSF CELL COUNT WITH DIFFERENTIAL - Abnormal; Notable for the following components:  ? Color, CSF CLEAR (*)   ? Appearance, CSF COLORLESS (*)   ? RBC Count, CSF 32 (*)   ? All other components within normal limits  ?PROTEIN AND GLUCOSE, CSF - Abnormal; Notable for the following components:  ? Glucose, CSF 76 (*)   ? Total  Protein, CSF 47 (*)   ? All other components within normal limits  ?CBG MONITORING, ED - Abnormal; Notable for the following components:  ? Glucose-Capillary 148 (*)   ? All other components within normal limits  ?TROPONIN I (HIGH SENSITIVITY) - Abnormal; Notable for the following components:  ? Troponin I (High Sensitivity) 466 (*)   ? All other components within normal limits  ?TROPONIN I (HIGH SENSITIVITY) - Abnormal; Notable for the following components:  ? Troponin I (High Sensitivity) 3,163 (*)   ? All other components within normal limits  ?CULTURE, BLOOD (ROUTINE X 2)  ?CULTURE, BLOOD (ROUTINE X 2)  ?RESP PANEL BY RT-PCR (FLU A&B, COVID) ARPGX2  ?CSF CULTURE W GRAM STAIN  ?URINE CULTURE  ?AEROBIC/ANAEROBIC CULTURE W GRAM STAIN (SURGICAL/DEEP WOUND)  ?APTT  ?PROCALCITONIN  ?PROCALCITONIN  ?MAGNESIUM  ?LEVETIRACETAM LEVEL  ?HEPARIN LEVEL (UNFRACTIONATED)  ? ? ? ?EKG ? ?ECG is remarkable sinus tachycardia with a ventricular rate of 117 and fairly diffuse ST changes  throughout with evidence of what appears to be a incomplete bundle branch block. ? ?RADIOLOGY ? ?Chest x-ray on my interpretation shows cardiomegaly and some bilateral atelectasis without clear focal consolidation, effusion, edema, pneumothorax or other clear acute thoracic process.  I also reviewed radiology interpretation and agree with their findings of same. ? ?CT head on my interpretation with evidence of right-sided encephalomalacia similar to prior.  I reviewed radiologist rotation and agree  with their findings this is progressed since it was last image from the site of previous infarct in the distribution of the right MCA. ? ?Plain film of the left knee and tibia on my interpretation shows no fracture dislocation or other clear acute process.  Also reviewed radiology interpretation and agree with the findings of same. ? ?PROCEDURES: ? ?Critical Care performed: No ? ?.Critical Care ?Performed by: Gilles Chiquito, MD ?Authorized by: Gilles Chiquito, MD  ? ?Critical care provider statement:  ?  Critical care time (minutes):  75 ?  Critical care was necessary to treat or prevent imminent or life-threatening deterioration of the following conditions:  CNS failure or compromise ?  Critical care was time spent personally by me on the following activities:  Development of treatment plan with patient or surrogate, discussions with consultants, evaluation of patient's response to treatment, examination of patient, ordering and review of laboratory studies, ordering and review of radiographic studies, ordering and performing treatments and interventions, pulse oximetry, re-evaluation of patient's condition and review of old charts ?.Lumbar Puncture ? ?Date/Time: 11/07/2021 1:54 AM ?Performed by: Gilles Chiquito, MD ?Authorized by: Gilles Chiquito, MD  ? ?Consent:  ?  Consent obtained:  Verbal ?  Consent given by:  Healthcare agent Roger Shelter, [place] Brother) ?  Risks, benefits, and alternatives were discussed: yes   ?   Risks discussed:  Bleeding, pain, infection, nerve damage, headache and repeat procedure ?  Alternatives discussed:  No treatment ?Universal protocol:  ?  Procedure explained and questions answered to patien

## 2021-11-06 NOTE — ED Triage Notes (Signed)
Arrived via EMS from nursing home. Staff reported seizure like activity. Reported temp 101 and 160 mg oral tylenol given. Oxygen saturation 70% on EMS arrival. Placed on 100% NRB. EMS reports two episodes of tonic-clonic seizures. 5 mg IM versed given by EMS. R IO and 300 ml NS given. Unresponsive on arrival to ED with left upward eye deviation. Not following commands or withdrawing to painful stimulation. Respirations even, regular on NRB.  ?

## 2021-11-07 ENCOUNTER — Inpatient Hospital Stay
Admission: AD | Admit: 2021-11-07 | Payer: Medicaid Other | Source: Other Acute Inpatient Hospital | Admitting: Internal Medicine

## 2021-11-07 ENCOUNTER — Emergency Department: Payer: Medicaid Other

## 2021-11-07 ENCOUNTER — Inpatient Hospital Stay (HOSPITAL_COMMUNITY)
Admit: 2021-11-07 | Discharge: 2021-11-07 | Disposition: A | Discharge status: Home / Self Care | Payer: Medicaid Other | Attending: Medical | Admitting: Medical

## 2021-11-07 ENCOUNTER — Other Ambulatory Visit: Payer: Self-pay

## 2021-11-07 DIAGNOSIS — E669 Obesity, unspecified: Secondary | ICD-10-CM | POA: Diagnosis present

## 2021-11-07 DIAGNOSIS — G40919 Epilepsy, unspecified, intractable, without status epilepticus: Secondary | ICD-10-CM

## 2021-11-07 DIAGNOSIS — I2729 Other secondary pulmonary hypertension: Secondary | ICD-10-CM | POA: Diagnosis present

## 2021-11-07 DIAGNOSIS — Z825 Family history of asthma and other chronic lower respiratory diseases: Secondary | ICD-10-CM | POA: Diagnosis not present

## 2021-11-07 DIAGNOSIS — I248 Other forms of acute ischemic heart disease: Secondary | ICD-10-CM

## 2021-11-07 DIAGNOSIS — G4733 Obstructive sleep apnea (adult) (pediatric): Secondary | ICD-10-CM | POA: Diagnosis present

## 2021-11-07 DIAGNOSIS — I428 Other cardiomyopathies: Secondary | ICD-10-CM | POA: Diagnosis present

## 2021-11-07 DIAGNOSIS — Z818 Family history of other mental and behavioral disorders: Secondary | ICD-10-CM | POA: Diagnosis not present

## 2021-11-07 DIAGNOSIS — Z8249 Family history of ischemic heart disease and other diseases of the circulatory system: Secondary | ICD-10-CM | POA: Diagnosis not present

## 2021-11-07 DIAGNOSIS — Y929 Unspecified place or not applicable: Secondary | ICD-10-CM | POA: Diagnosis not present

## 2021-11-07 DIAGNOSIS — I214 Non-ST elevation (NSTEMI) myocardial infarction: Secondary | ICD-10-CM | POA: Diagnosis not present

## 2021-11-07 DIAGNOSIS — J449 Chronic obstructive pulmonary disease, unspecified: Secondary | ICD-10-CM | POA: Diagnosis present

## 2021-11-07 DIAGNOSIS — R6510 Systemic inflammatory response syndrome (SIRS) of non-infectious origin without acute organ dysfunction: Secondary | ICD-10-CM

## 2021-11-07 DIAGNOSIS — R569 Unspecified convulsions: Secondary | ICD-10-CM

## 2021-11-07 DIAGNOSIS — M86462 Chronic osteomyelitis with draining sinus, left tibia and fibula: Secondary | ICD-10-CM | POA: Diagnosis present

## 2021-11-07 DIAGNOSIS — Z7984 Long term (current) use of oral hypoglycemic drugs: Secondary | ICD-10-CM | POA: Diagnosis not present

## 2021-11-07 DIAGNOSIS — T8459XA Infection and inflammatory reaction due to other internal joint prosthesis, initial encounter: Secondary | ICD-10-CM | POA: Diagnosis present

## 2021-11-07 DIAGNOSIS — Z66 Do not resuscitate: Secondary | ICD-10-CM | POA: Diagnosis present

## 2021-11-07 DIAGNOSIS — N1831 Chronic kidney disease, stage 3a: Secondary | ICD-10-CM | POA: Diagnosis present

## 2021-11-07 DIAGNOSIS — I2489 Other forms of acute ischemic heart disease: Secondary | ICD-10-CM

## 2021-11-07 DIAGNOSIS — G931 Anoxic brain damage, not elsewhere classified: Secondary | ICD-10-CM | POA: Diagnosis present

## 2021-11-07 DIAGNOSIS — Z87891 Personal history of nicotine dependence: Secondary | ICD-10-CM | POA: Diagnosis not present

## 2021-11-07 DIAGNOSIS — E1122 Type 2 diabetes mellitus with diabetic chronic kidney disease: Secondary | ICD-10-CM | POA: Diagnosis present

## 2021-11-07 DIAGNOSIS — I5023 Acute on chronic systolic (congestive) heart failure: Secondary | ICD-10-CM | POA: Diagnosis present

## 2021-11-07 DIAGNOSIS — I10 Essential (primary) hypertension: Secondary | ICD-10-CM | POA: Diagnosis not present

## 2021-11-07 DIAGNOSIS — I69354 Hemiplegia and hemiparesis following cerebral infarction affecting left non-dominant side: Secondary | ICD-10-CM | POA: Diagnosis not present

## 2021-11-07 DIAGNOSIS — G40909 Epilepsy, unspecified, not intractable, without status epilepticus: Secondary | ICD-10-CM | POA: Diagnosis present

## 2021-11-07 DIAGNOSIS — I130 Hypertensive heart and chronic kidney disease with heart failure and stage 1 through stage 4 chronic kidney disease, or unspecified chronic kidney disease: Secondary | ICD-10-CM | POA: Diagnosis present

## 2021-11-07 DIAGNOSIS — Y792 Prosthetic and other implants, materials and accessory orthopedic devices associated with adverse incidents: Secondary | ICD-10-CM | POA: Diagnosis present

## 2021-11-07 DIAGNOSIS — Z841 Family history of disorders of kidney and ureter: Secondary | ICD-10-CM | POA: Diagnosis not present

## 2021-11-07 DIAGNOSIS — E872 Acidosis, unspecified: Secondary | ICD-10-CM | POA: Diagnosis present

## 2021-11-07 DIAGNOSIS — R778 Other specified abnormalities of plasma proteins: Secondary | ICD-10-CM

## 2021-11-07 DIAGNOSIS — Z20822 Contact with and (suspected) exposure to covid-19: Secondary | ICD-10-CM | POA: Diagnosis present

## 2021-11-07 LAB — ECHOCARDIOGRAM COMPLETE
AR max vel: 2.05 cm2
AV Area VTI: 1.81 cm2
AV Area mean vel: 2.23 cm2
AV Mean grad: 4 mmHg
AV Peak grad: 7.1 mmHg
Ao pk vel: 1.33 m/s
Area-P 1/2: 3.7 cm2
Height: 68 in
MV VTI: 2.2 cm2
S' Lateral: 3.47 cm
Weight: 3305.14 oz

## 2021-11-07 LAB — GLUCOSE, CAPILLARY
Glucose-Capillary: 96 mg/dL (ref 70–99)
Glucose-Capillary: 80 mg/dL (ref 70–99)
Glucose-Capillary: 72 mg/dL (ref 70–99)

## 2021-11-07 LAB — COMPREHENSIVE METABOLIC PANEL
ALT: 18 U/L (ref 0–44)
AST: 41 U/L (ref 15–41)
Albumin: 3.4 g/dL — ABNORMAL LOW (ref 3.5–5.0)
Alkaline Phosphatase: 105 U/L (ref 38–126)
Anion gap: 13 (ref 5–15)
BUN: 15 mg/dL (ref 8–23)
CO2: 21 mmol/L — ABNORMAL LOW (ref 22–32)
Calcium: 8.7 mg/dL — ABNORMAL LOW (ref 8.9–10.3)
Chloride: 106 mmol/L (ref 98–111)
Creatinine, Ser: 1.63 mg/dL — ABNORMAL HIGH (ref 0.61–1.24)
GFR, Estimated: 48 mL/min — ABNORMAL LOW (ref >60)
Glucose, Bld: 171 mg/dL — ABNORMAL HIGH (ref 70–99)
Potassium: 4.7 mmol/L (ref 3.5–5.1)
Sodium: 140 mmol/L (ref 135–145)
Total Bilirubin: 1.4 mg/dL — ABNORMAL HIGH (ref 0.3–1.2)
Total Protein: 7.5 g/dL (ref 6.5–8.1)

## 2021-11-07 LAB — URINALYSIS, COMPLETE (UACMP) WITH MICROSCOPIC
Bacteria, UA: NONE SEEN
Bilirubin Urine: NEGATIVE
Glucose, UA: NEGATIVE mg/dL
Ketones, ur: NEGATIVE mg/dL
Leukocytes,Ua: NEGATIVE
Nitrite: NEGATIVE
Protein, ur: 100 mg/dL — AB
Specific Gravity, Urine: 1.011 (ref 1.005–1.030)
Squamous Epithelial / HPF: NONE SEEN (ref 0–5)
pH: 6 (ref 5.0–8.0)

## 2021-11-07 LAB — MRSA NEXT GEN BY PCR, NASAL: MRSA by PCR Next Gen: NOT DETECTED

## 2021-11-07 LAB — RESP PANEL BY RT-PCR (FLU A&B, COVID) ARPGX2
Influenza A by PCR: NEGATIVE
Influenza B by PCR: NEGATIVE
SARS Coronavirus 2 by RT PCR: NEGATIVE

## 2021-11-07 LAB — PROCALCITONIN
Procalcitonin: <0.1 ng/mL
Procalcitonin: <0.1 ng/mL

## 2021-11-07 LAB — CSF CELL COUNT WITH DIFFERENTIAL
RBC Count, CSF: 32 /mm3 — ABNORMAL HIGH (ref 0–3)
Tube #: 4
WBC, CSF: 0 /mm3 (ref 0–5)

## 2021-11-07 LAB — TROPONIN I (HIGH SENSITIVITY)
Troponin I (High Sensitivity): 466 ng/L (ref <18)
Troponin I (High Sensitivity): 3163 ng/L (ref <18)
Troponin I (High Sensitivity): 2047 ng/L (ref <18)

## 2021-11-07 LAB — HEPARIN LEVEL (UNFRACTIONATED)
Heparin Unfractionated: 0.94 IU/mL — ABNORMAL HIGH (ref 0.30–0.70)
Heparin Unfractionated: 0.93 IU/mL — ABNORMAL HIGH (ref 0.30–0.70)

## 2021-11-07 LAB — SEDIMENTATION RATE: Sed Rate: 23 mm/hr — ABNORMAL HIGH (ref 0–20)

## 2021-11-07 LAB — AMMONIA: Ammonia: 36 umol/L — ABNORMAL HIGH (ref 9–35)

## 2021-11-07 LAB — LACTIC ACID, PLASMA: Lactic Acid, Venous: 2.7 mmol/L (ref 0.5–1.9)

## 2021-11-07 LAB — PROTEIN AND GLUCOSE, CSF
Glucose, CSF: 76 mg/dL — ABNORMAL HIGH (ref 40–70)
Total  Protein, CSF: 47 mg/dL — ABNORMAL HIGH (ref 15–45)

## 2021-11-07 LAB — MAGNESIUM: Magnesium: 2.2 mg/dL (ref 1.7–2.4)

## 2021-11-07 LAB — TSH: TSH: 4.654 u[IU]/mL — ABNORMAL HIGH (ref 0.350–4.500)

## 2021-11-07 IMAGING — DX DG TIBIA/FIBULA 2V*L*
4 series · 4 of 4 positions shown · non-contrast
Comparison: None.

CLINICAL DATA: Leg wound

EXAM:
LEFT TIBIA AND FIBULA - 2 VIEW

[tibia ap (1 of 2)]
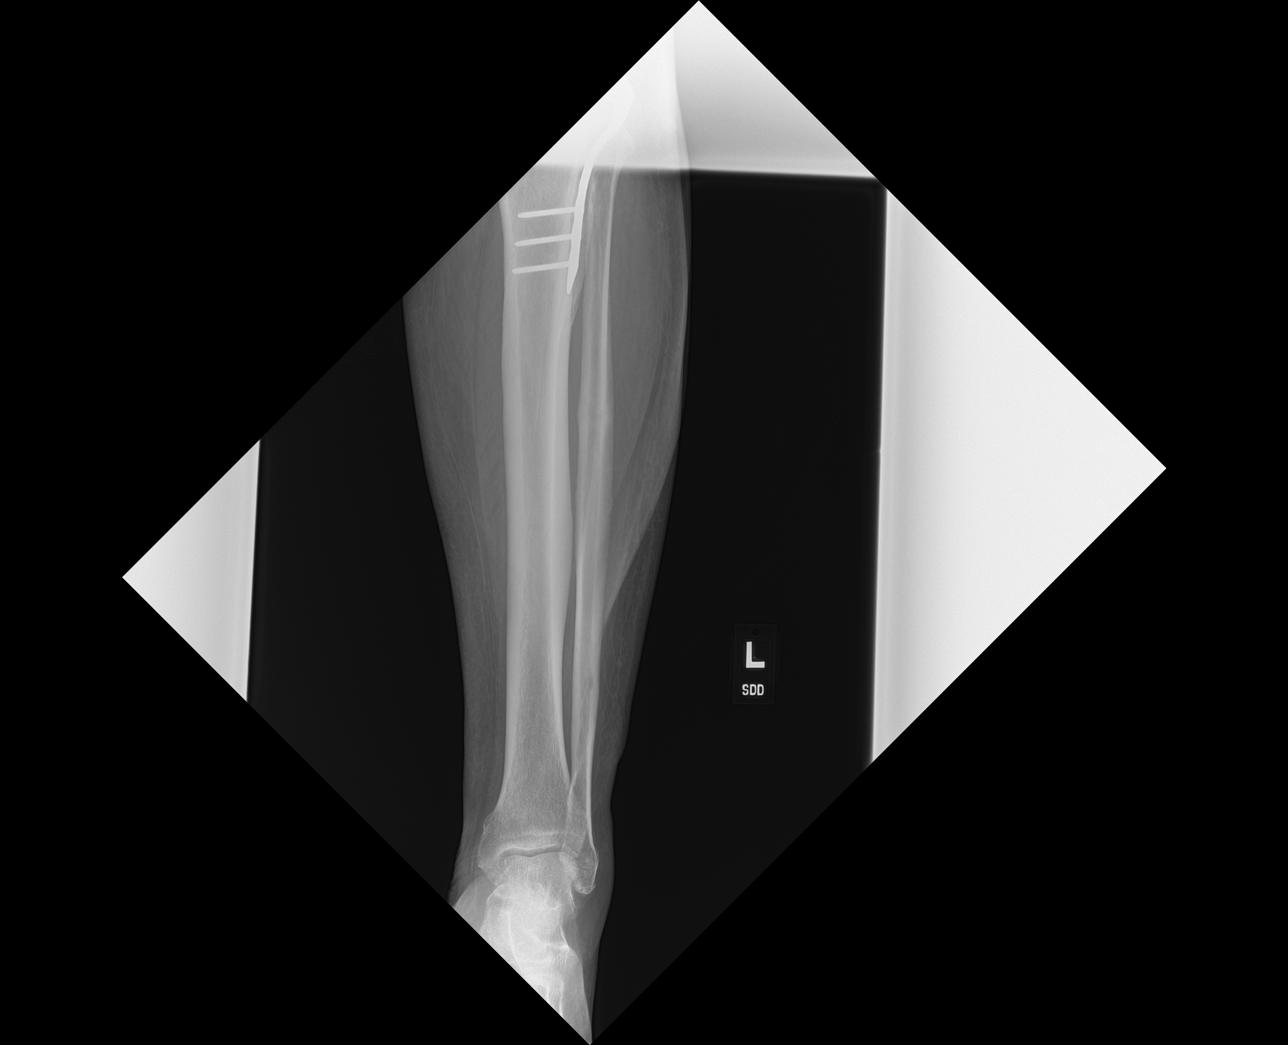

[tibia ap (2 of 2)]
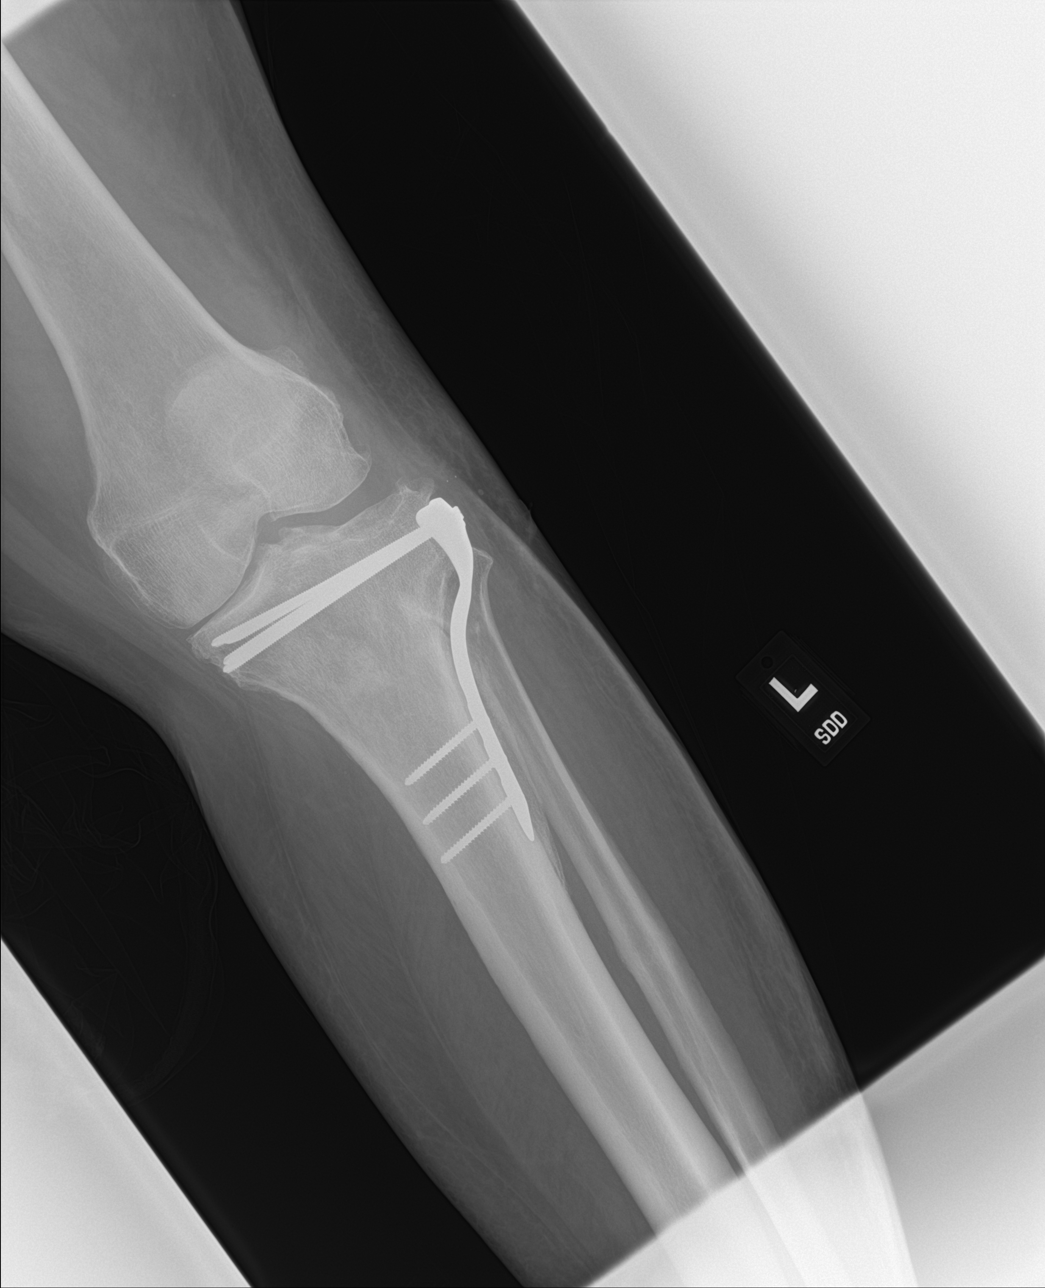

[tibia lat (1 of 2)]
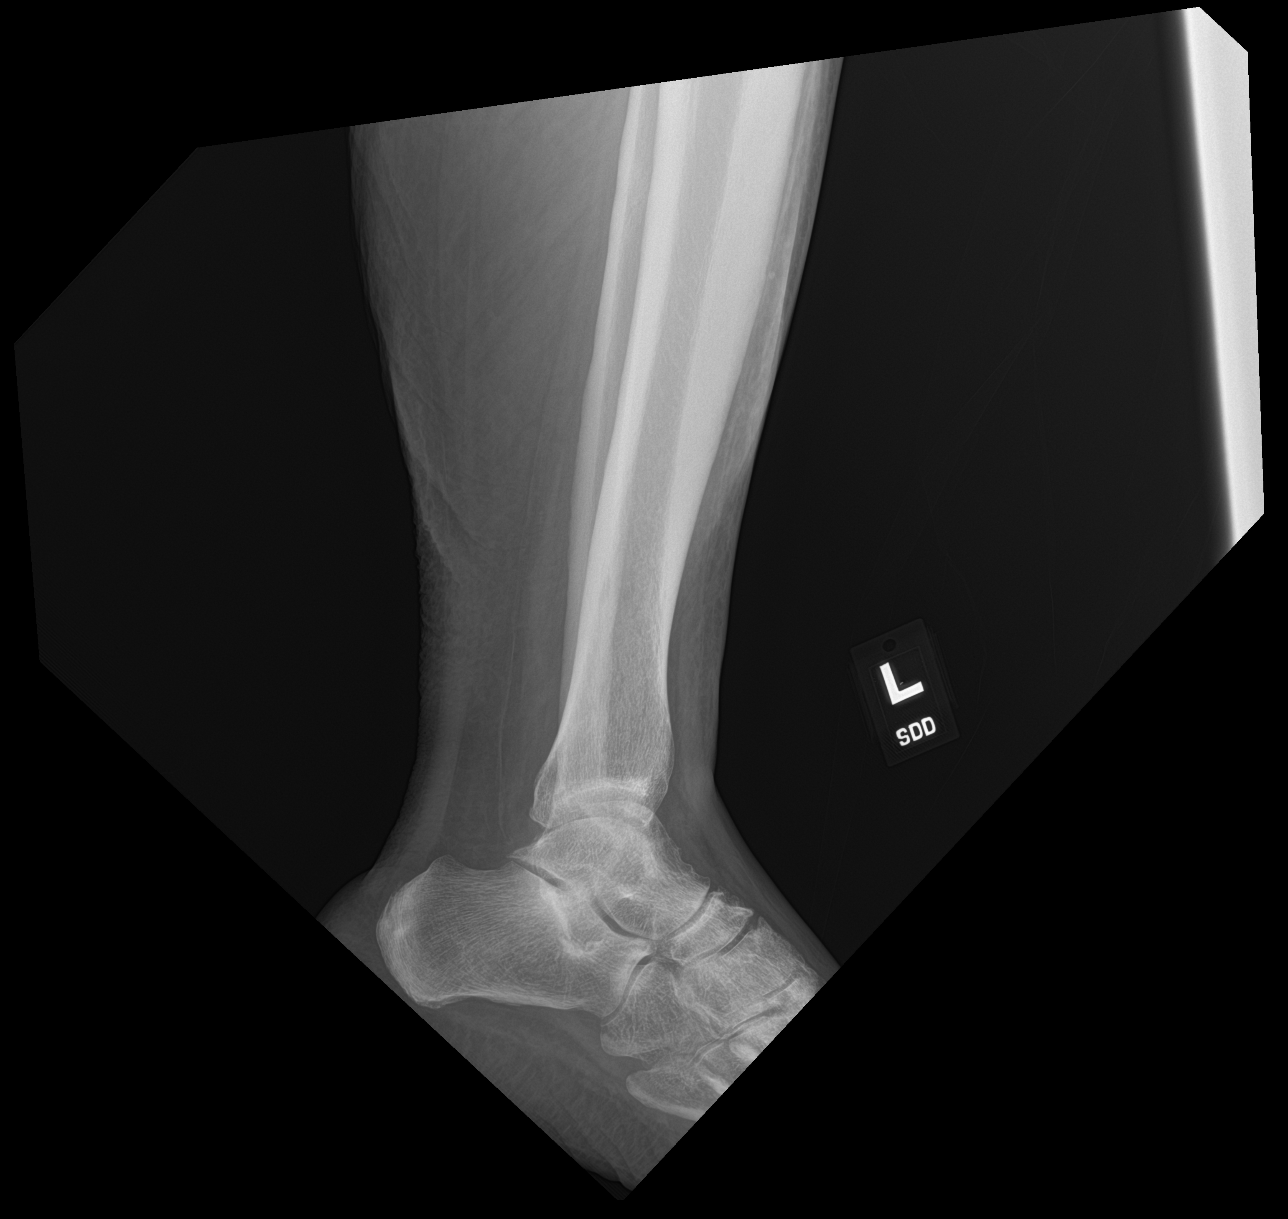

[tibia lat (2 of 2)]
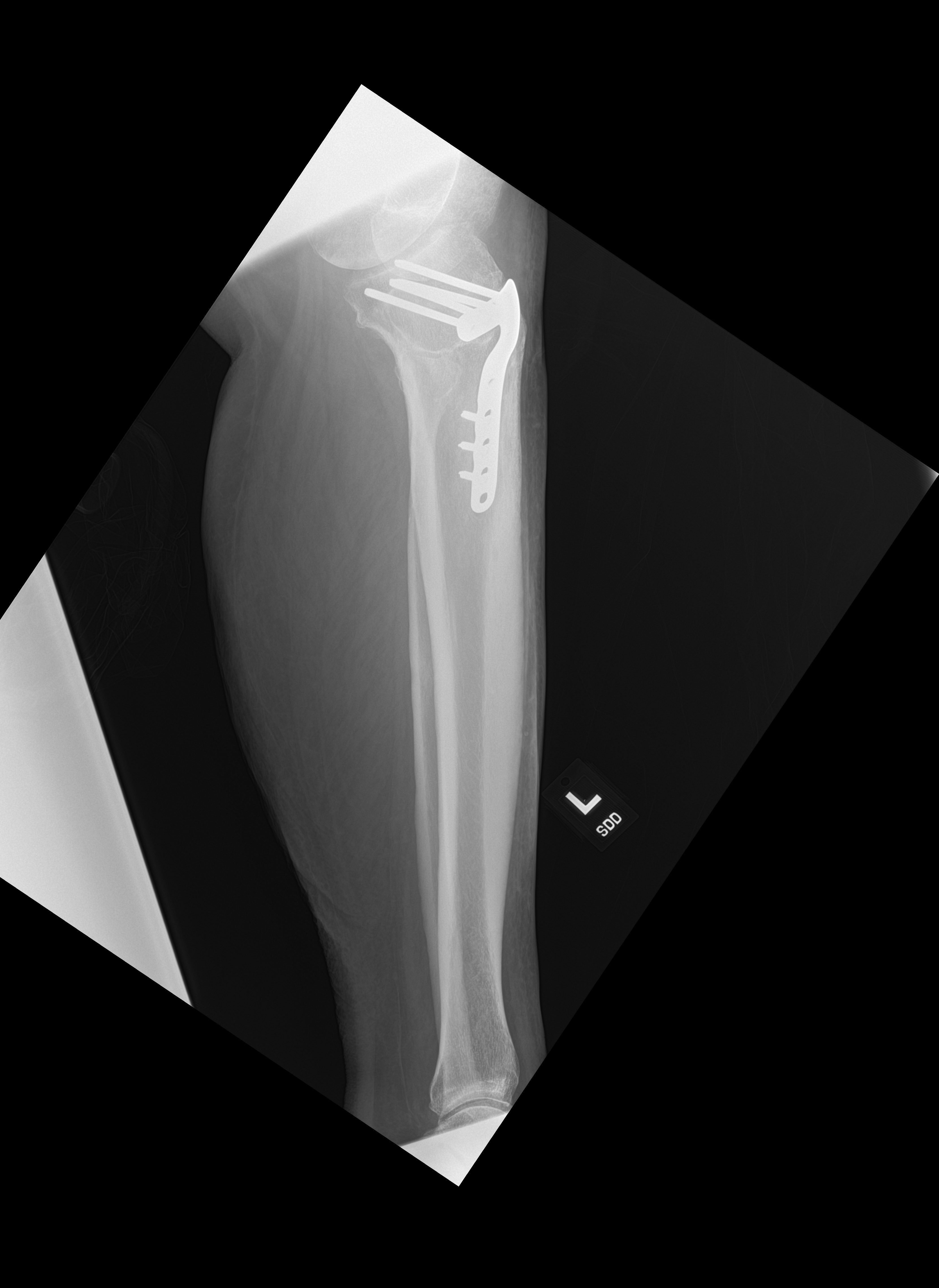

[4 of 4 positions shown; findings below may reference images not displayed]

FINDINGS: No fracture or dislocation is seen.

Status post ORIF of the proximal tibia.

Tibiotalar joint is preserved.

Visualized soft tissues are within normal limits.
IMPRESSION: Negative.

## 2021-11-07 IMAGING — DX DG KNEE COMPLETE 4+V*L*
4 series · 4 of 4 positions shown · non-contrast
Comparison: None.

CLINICAL DATA: Wound

EXAM:
LEFT KNEE - COMPLETE 4+ VIEW

[knee ap (1 of 2)]
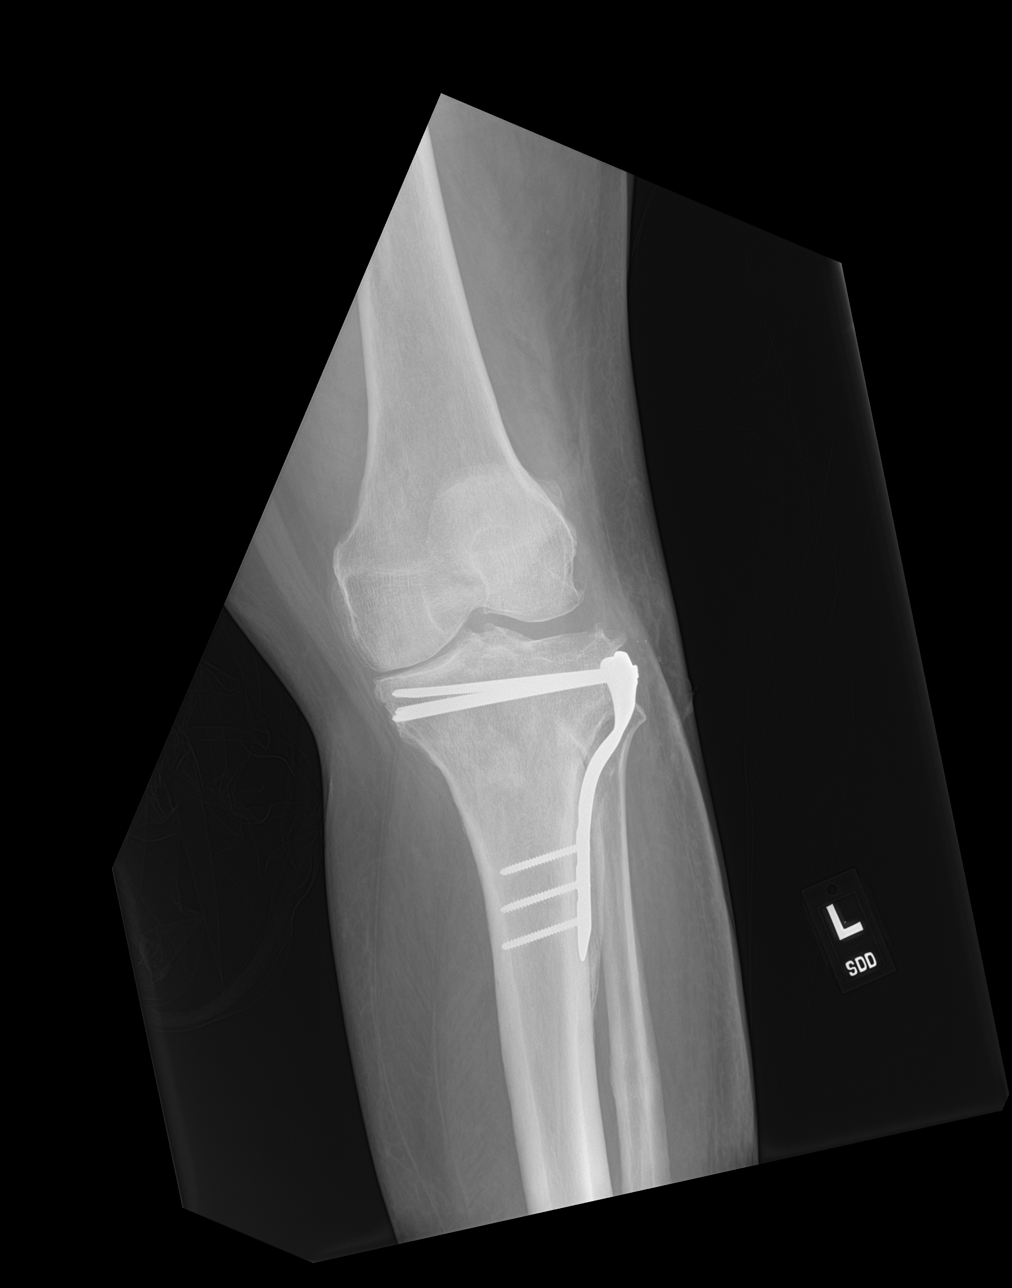

[knee lat]
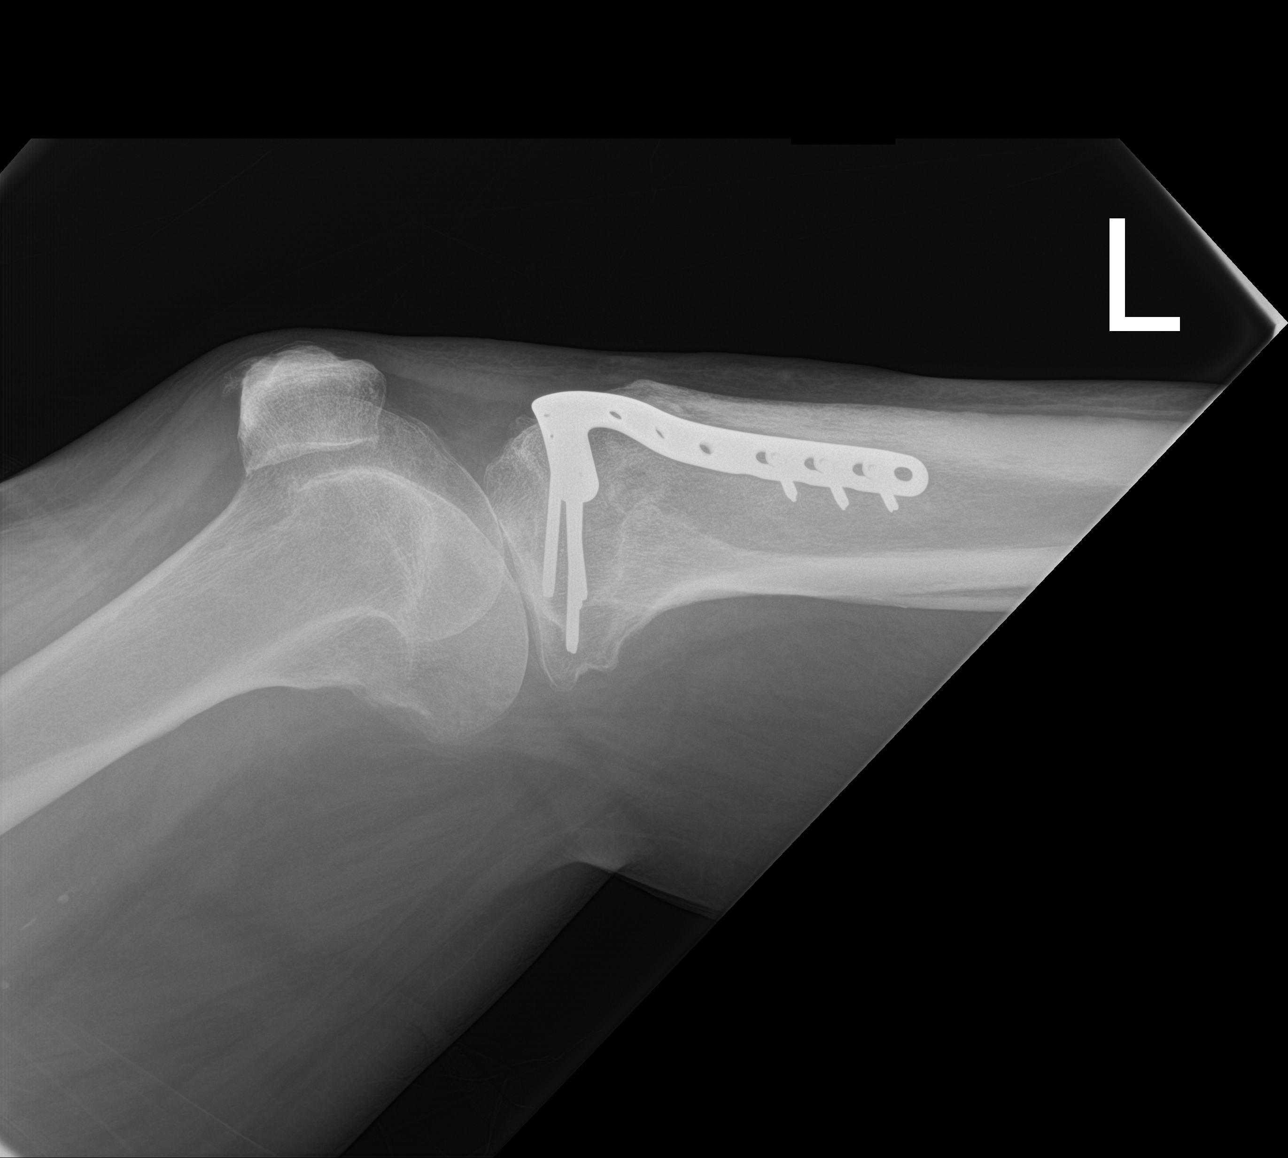

[knee obl]
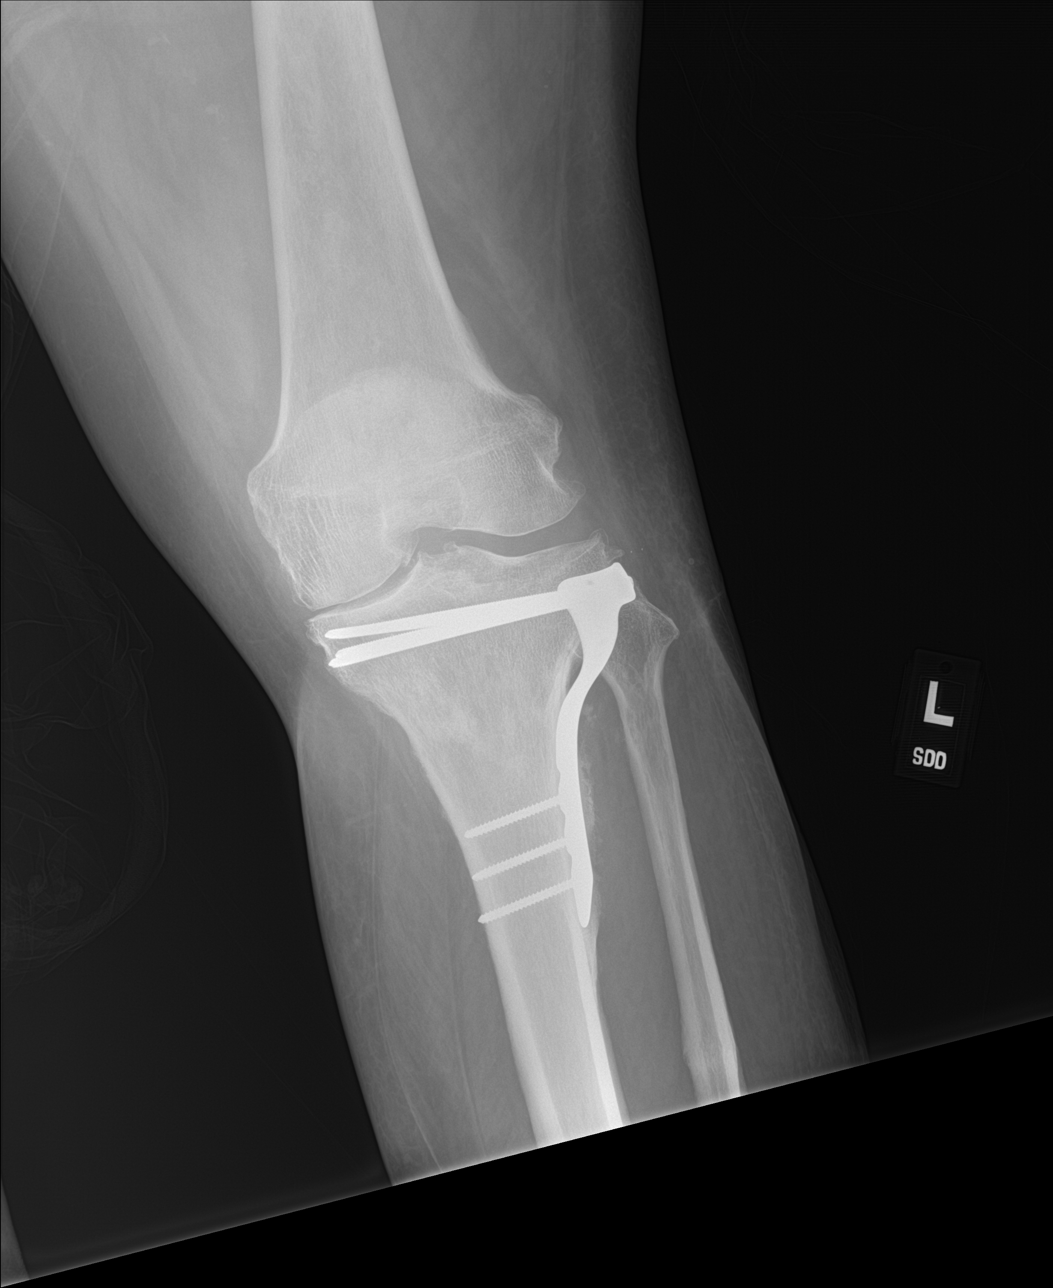

[knee ap (2 of 2)]
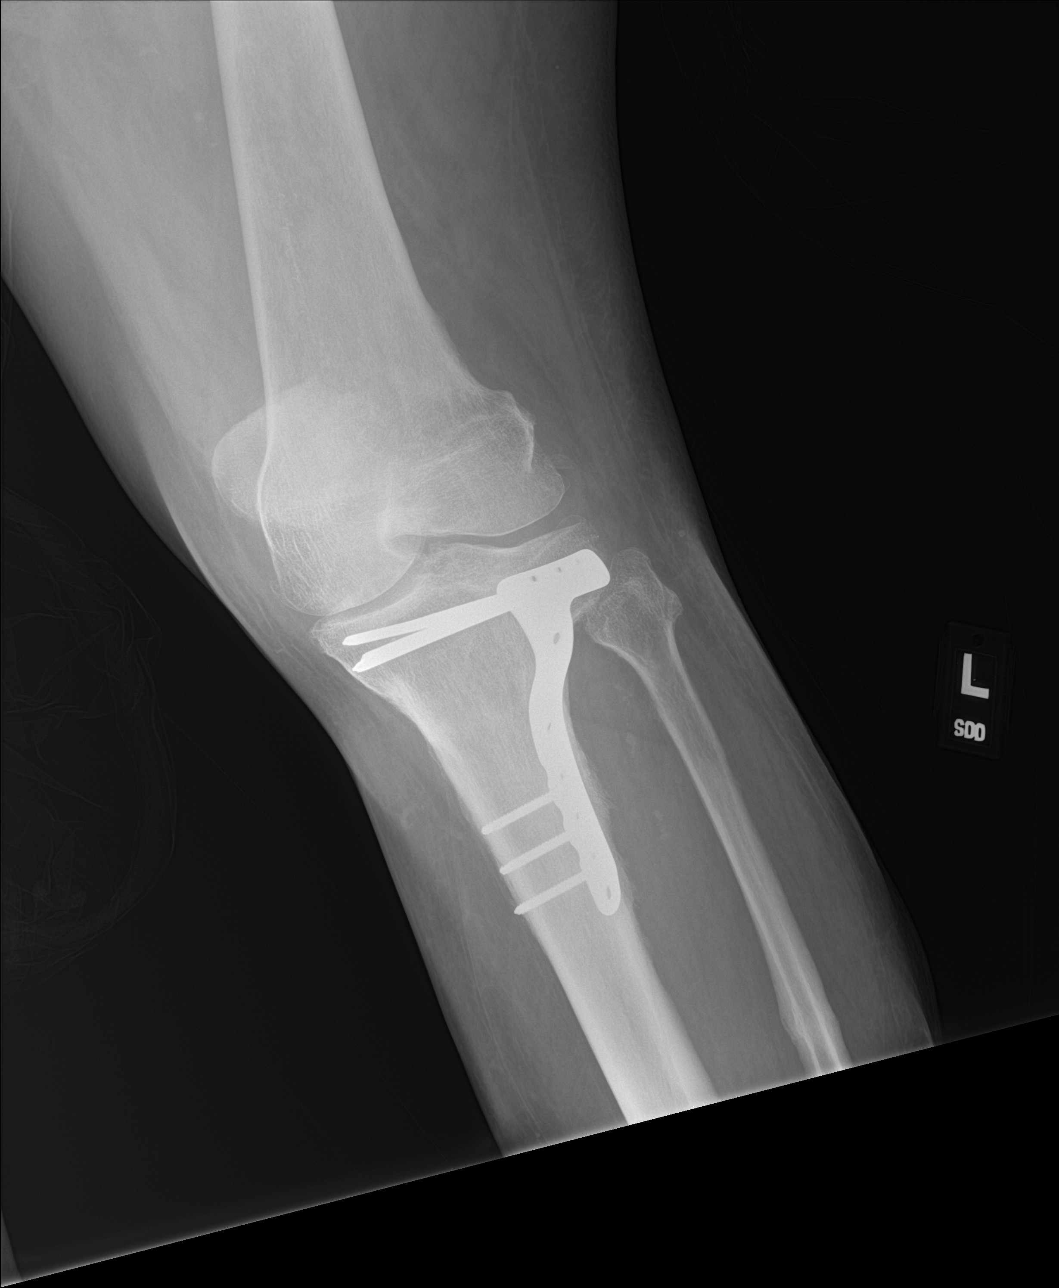

[4 of 4 positions shown; findings below may reference images not displayed]

FINDINGS: Lateral compression plate and screw fixation of the proximal tibia,
without evidence of hardware complication.

No fracture or dislocation is seen.

Mild degenerative changes, most prominent in the medial compartment.

Visualized soft tissues are within normal limits.

No suprapatellar knee joint effusion.
IMPRESSION: Postsurgical and mild degenerative changes, as above.

## 2021-11-07 MED ORDER — CHLORHEXIDINE GLUCONATE CLOTH 2 % EX PADS
6.0000 | MEDICATED_PAD | Freq: Every day | CUTANEOUS | Status: DC
  Administered 2021-11-07 – 2021-11-08 (×2): 6 via TOPICAL

## 2021-11-07 MED ORDER — LEVETIRACETAM IN NACL 1000 MG/100ML IV SOLN
1000.0000 mg | Freq: Two times a day (BID) | INTRAVENOUS | Status: DC
  Administered 2021-11-07 – 2021-11-08 (×2): 1000 mg via INTRAVENOUS
  Filled 2021-11-07 (×2): qty 100

## 2021-11-07 MED ORDER — SODIUM CHLORIDE 0.9 % IV SOLN
2.0000 g | Freq: Once | INTRAVENOUS | Status: AC
  Administered 2021-11-07: 2 g via INTRAVENOUS
  Filled 2021-11-07: qty 2

## 2021-11-07 MED ORDER — DOCUSATE SODIUM 100 MG PO CAPS
100.0000 mg | ORAL_CAPSULE | Freq: Two times a day (BID) | ORAL | Status: DC | PRN
Start: 2021-11-07 — End: 2021-11-08

## 2021-11-07 MED ORDER — TAMSULOSIN HCL 0.4 MG PO CAPS
0.4000 mg | ORAL_CAPSULE | Freq: Every day | ORAL | Status: DC
Start: 2021-11-07 — End: 2021-11-08
  Administered 2021-11-07 – 2021-11-08 (×2): 0.4 mg via ORAL
  Filled 2021-11-07 (×2): qty 1

## 2021-11-07 MED ORDER — POLYETHYLENE GLYCOL 3350 17 G PO PACK
17.0000 g | PACK | Freq: Two times a day (BID) | ORAL | Status: DC | PRN
Start: 2021-11-07 — End: 2021-11-08

## 2021-11-07 MED ORDER — ASPIRIN 300 MG RE SUPP
300.0000 mg | Freq: Once | RECTAL | Status: AC
Start: 2021-11-07 — End: 2021-11-07
  Administered 2021-11-07: 300 mg via RECTAL
  Filled 2021-11-07: qty 1

## 2021-11-07 MED ORDER — LEVETIRACETAM IN NACL 1500 MG/100ML IV SOLN
1500.0000 mg | Freq: Once | INTRAVENOUS | Status: AC
  Administered 2021-11-07: 1500 mg via INTRAVENOUS
  Filled 2021-11-07: qty 100

## 2021-11-07 MED ORDER — FUROSEMIDE 20 MG PO TABS
20.0000 mg | ORAL_TABLET | Freq: Every day | ORAL | Status: DC
  Administered 2021-11-08: 20 mg via ORAL
  Filled 2021-11-07: qty 1

## 2021-11-07 MED ORDER — BLISTEX MEDICATED EX OINT
TOPICAL_OINTMENT | CUTANEOUS | Status: DC | PRN
  Filled 2021-11-07: qty 6.3

## 2021-11-07 MED ORDER — ACETAMINOPHEN 500 MG PO TABS
1000.0000 mg | ORAL_TABLET | Freq: Three times a day (TID) | ORAL | Status: DC | PRN
  Administered 2021-11-07 (×2): 1000 mg via ORAL
  Filled 2021-11-07 (×2): qty 2

## 2021-11-07 MED ORDER — PERFLUTREN LIPID MICROSPHERE
1.0000 mL | INTRAVENOUS | Status: AC | PRN
  Administered 2021-11-07: 2 mL via INTRAVENOUS
  Filled 2021-11-07: qty 10

## 2021-11-07 MED ORDER — SERTRALINE HCL 50 MG PO TABS
25.0000 mg | ORAL_TABLET | Freq: Every day | ORAL | Status: DC
Start: 2021-11-08 — End: 2021-11-08
  Administered 2021-11-08: 25 mg via ORAL
  Filled 2021-11-07: qty 1

## 2021-11-07 MED ORDER — ONDANSETRON HCL 4 MG/2ML IJ SOLN: 4.0000 mg | Freq: Four times a day (QID) | INTRAMUSCULAR | Status: DC | PRN

## 2021-11-07 MED ORDER — VANCOMYCIN HCL IN DEXTROSE 1-5 GM/200ML-% IV SOLN
1000.0000 mg | Freq: Once | INTRAVENOUS | Status: DC
  Filled 2021-11-07: qty 200

## 2021-11-07 MED ORDER — METRONIDAZOLE 500 MG/100ML IV SOLN
500.0000 mg | Freq: Once | INTRAVENOUS | Status: AC
  Administered 2021-11-07: 500 mg via INTRAVENOUS
  Filled 2021-11-07: qty 100

## 2021-11-07 MED ORDER — HEPARIN (PORCINE) 25000 UT/250ML-% IV SOLN
900.0000 [IU]/h | INTRAVENOUS | Status: DC
  Administered 2021-11-07: 1300 [IU]/h via INTRAVENOUS
  Administered 2021-11-07: 900 [IU]/h via INTRAVENOUS
  Filled 2021-11-07 (×2): qty 250

## 2021-11-07 MED ORDER — CARVEDILOL 6.25 MG PO TABS
6.2500 mg | ORAL_TABLET | Freq: Two times a day (BID) | ORAL | Status: DC
  Administered 2021-11-07: 6.25 mg via ORAL
  Filled 2021-11-07 (×2): qty 1

## 2021-11-07 MED ORDER — HEPARIN BOLUS VIA INFUSION
4000.0000 [IU] | Freq: Once | INTRAVENOUS | Status: AC
  Administered 2021-11-07: 4000 [IU] via INTRAVENOUS
  Filled 2021-11-07: qty 4000

## 2021-11-07 MED ORDER — PNEUMOCOCCAL 20-VAL CONJ VACC 0.5 ML IM SUSY: 0.5000 mL | PREFILLED_SYRINGE | INTRAMUSCULAR | Status: DC

## 2021-11-07 MED ORDER — LACTATED RINGERS IV SOLN: INTRAVENOUS | Status: AC

## 2021-11-07 MED ORDER — VANCOMYCIN HCL 2000 MG/400ML IV SOLN
2000.0000 mg | Freq: Once | INTRAVENOUS | Status: AC
  Administered 2021-11-07: 2000 mg via INTRAVENOUS
  Filled 2021-11-07: qty 400

## 2021-11-07 MED ORDER — ONDANSETRON 4 MG PO TBDP
4.0000 mg | ORAL_TABLET | Freq: Three times a day (TID) | ORAL | Status: DC | PRN
Start: 2021-11-07 — End: 2021-11-08
  Filled 2021-11-07: qty 1

## 2021-11-07 NOTE — Plan of Care (Signed)
  Problem: Clinical Measurements: Goal: Respiratory complications will improve Outcome: Progressing   Problem: Clinical Measurements: Goal: Diagnostic test results will improve Outcome: Progressing   

## 2021-11-07 NOTE — Assessment & Plan Note (Signed)
DuoNebs as needed 

## 2021-11-07 NOTE — ED Notes (Signed)
Stat neuro consult paged out ?

## 2021-11-07 NOTE — ED Notes (Signed)
Called Tammy @ [facility] to re-page hospitalist [place] ?

## 2021-11-07 NOTE — Plan of Care (Signed)
?  Problem: Education: ?Goal: Knowledge of General Education information will improve ?Description: Including pain rating scale, medication(s)/side effects and non-pharmacologic comfort measures ?Outcome: Progressing ?  ?Problem: Safety: ?Goal: Verbalization of understanding the information provided will improve ?Outcome: Progressing ?  ?

## 2021-11-07 NOTE — Progress Notes (Signed)
Eeg done 

## 2021-11-07 NOTE — Progress Notes (Signed)
ANTICOAGULATION CONSULT NOTE ? ?Pharmacy Consult for IV Heparin  ?Indication: ACS/NSTEMI  ? ?Patient Measurements: ?Height: 5\' 8"  (172.7 cm) ?Weight: 93.7 kg (206 lb 9.1 oz) ?IBW/kg (Calculated) : 68.4 ?Heparin Dosing Weight:  88 kg ? ?Labs: ?Recent Labs  ?  11/06/21 ?2305 11/07/21 ?0105  ?HGB 13.8  --   ?HCT 45.3  --   ?PLT 208  --   ?APTT 31  --   ?LABPROT 15.3*  --   ?INR 1.2  --   ?CREATININE 1.63*  --   ?TROPONINIHS 466* 3,163*  ? ? ?Estimated Creatinine Clearance: 52.8 mL/min (A) (by C-G formula based on SCr of 1.63 mg/dL (H)). ? ?Medical History: ?Past Medical History:  ?Diagnosis Date  ? Diabetes mellitus type II, non insulin dependent ([place])   ? Essential hypertension Raquel Sarna like  ? h/o Proteus pneumonia ([place]) 02/17/2021  ? Heart failure ([place])   ? Unknown details.  By report no cardiac surgery  ? Severe hypoxic-ischemic encephalopathy   ? Venous stasis dermatitis of left lower extremity   ? ?Assessment: ?Patient is a 61 y/o M with medical history including T2DM, HTN, OSA, history of OOH V tach arrest in 05/22 with asymmetric anoxic brain injury and CVA with residual left hemiparesis and seizure disorder, HFrEF with recovered EF, CKD stage III who is admitted with breakthru seizures. Troponin also elevated. There is concern for ACS. Pharmacy consulted to initiate and monitor heparin infusion for ACS. ? ?Goal of Therapy:  ?Heparin level 0.3-0.7 units/ml ?Monitor platelets by anticoagulation protocol: Yes ?  ?Plan:  ?--Heparin level is supratherapeutic ?--Decrease heparin infusion to 1100 units/hr ?--Re-check HL 6 hours from rate change ?--Daily CBC per protocol while on IV heparin ? ?Benita Gutter ?11/07/2021,8:16 AM ? ? ?

## 2021-11-07 NOTE — Assessment & Plan Note (Addendum)
--  no need for SSI ?

## 2021-11-07 NOTE — ED Notes (Signed)
Called [facility] to initiate transfer to [facility] - [facility] ?

## 2021-11-07 NOTE — Assessment & Plan Note (Signed)
Management as above °

## 2021-11-07 NOTE — Assessment & Plan Note (Signed)
Renal function at baseline 

## 2021-11-07 NOTE — Progress Notes (Signed)
ANTICOAGULATION CONSULT NOTE - Initial Consult ? ?Pharmacy Consult for Heparin  ?Indication: ACS/NSTEMI  ? ?No Known Allergies ? ?Patient Measurements: ?Weight: 113.4 kg (250 lb) ?Heparin Dosing Weight:  93.87 kg - BASED ON ESTIMATED WT OF 250 LBS - WILL NEED TO VERIFY EXACT WEIGHT ? ?Vital Signs: ?Temp: 98.3 ?F (36.8 ?C) (03/19 2300) ?Temp Source: Axillary (03/19 2300) ?BP: 140/81 (03/20 0230) ?Pulse Rate: 81 (03/20 0230) ? ?Labs: ?Recent Labs  ?  11/06/21 ?2305 11/07/21 ?0105  ?HGB 13.8  --   ?HCT 45.3  --   ?PLT 208  --   ?APTT 31  --   ?LABPROT 15.3*  --   ?INR 1.2  --   ?CREATININE 1.63*  --   ?TROPONINIHS 466* 3,163*  ? ? ?Estimated Creatinine Clearance: 58.2 mL/min (A) (by C-G formula based on SCr of 1.63 mg/dL (H)). ? ? ?Medical History: ?Past Medical History:  ?Diagnosis Date  ? Diabetes mellitus type II, non insulin dependent ([place])   ? Essential hypertension Raquel Sarna like  ? h/o Proteus pneumonia ([place]) 02/17/2021  ? Heart failure ([place])   ? Unknown details.  By report no cardiac surgery  ? Severe hypoxic-ischemic encephalopathy   ? Venous stasis dermatitis of left lower extremity   ? ? ?Medications:  ?(Not in a hospital admission)  ? ?Assessment: ?Pharmacy consulted to dose heparin in this 61 year old male with ACS/NSTEMI.  No prior anticoag noted.  RN unable to provide exact weight b/c bed scale not working;  used estimated 250 lbs to start dosing.  ? ?CrCl = ?  ? ?Goal of Therapy:  ?Heparin level 0.3-0.7 units/ml ?Monitor platelets by anticoagulation protocol: Yes ?  ?Plan:  ?Give 4000 units bolus x 1 ?Start heparin infusion at 1300 units/hr ?Check anti-Xa level in 6 hours and daily while on heparin ?Continue to monitor H&H and platelets ? ?Jyron Turman D ?11/07/2021,3:35 AM ? ? ?

## 2021-11-07 NOTE — Consult Note (Signed)
?Cardiology Consultation:  ? ?Patient ID: Jeremy Browning ?MRN: [id number]; DOB: 04-11-1961 ? ?Admit date: 11/06/2021 ?Date of Consult: 11/07/2021 ? ?PCP:  Jeremy Roger, MD ?  ?[facility] Providers ?Cardiologist:  Ag[place] ? ?Patient Profile:  ? ?Jeremy Browning is a 61 y.o. male with a hx of a nonischemic cardiomyopathy with improvement of EF, normal coronaries by cath 12/2020, LBBB, DM, HTN, cardiac arrest, CVA, CKD, smoker/COPD, seizure disorder who is being seen 11/07/2021 for the evaluation of elevated troponin at the request of Dr. Billie Ruddy. ? ?History of Present Illness:  ? ?History mostly obtained through chart review.  ? ?Jeremy Browning is previously managed by cardiology at [facility].  Note suggest EF as low as 35% in the past. ? ?Patient was hospitalized at [facility] 01/07/2021 with respiratory failure, hypercapnia, cardiac arrest, VT requiring cardioversion.  Respiratory failure requiring tracheostomy and PEG.  Left heart cath showed normal coronaries.  LVEF noted to be 35 to 45%.  Right heart cath showed mild secondary pulmonary hypertension.  Subsequent echocardiogram showed LVEF 45 to 50% with grade 1 diastolic dysfunction.  He was placed on guide directed medical therapy. ? ?Seen in the office 06/06/2021 was overall stable.  Has been following with heart failure clinic at [facility], last seen 09/16/21. Has been at a SNF. ? ?The patient presented to the ER at [facility] 11/06/2021 from a skilled rehab who presented by EMS after 2 witnessed generalized tonic-clonic seizures.  Patient was obtunded on arrival in the ED.  He was given Ativan and Keppra IV.  Head CT showed no acute abnormality.  Initial recommendation was for transfer to [facility], however in the ED he became more responsive and was able to follow commands.  Neurology recommended admission to [facility] with continuous EEG.  Patient denies any memory of chest pain or shortness of breath. ? ?In the ER patient was noted to have a blood pressure 134/64, O2 96%, respiratory rate  26, heart rate 115.  Labs showed normal procalcitonin, elevated lactic acid of 5.7, sodium 140, potassium 4.7, creatinine 1.63, BUN 15, CO2 21, glucose 171, albumin 3.4, normal LFTs, WBC 7.4, hemoglobin 13.8.  TSH 4.6, mag 2.2.  Blood cultures drawn.  UA negative.  Respiratory panel negative.  Chest x-ray unremarkable.  Patient was given IV fluids, started on IV heparin and IV abx and admitted for further work-up. ? ? ?Past Medical History:  ?Diagnosis Date  ? Diabetes mellitus type II, non insulin dependent ([place])   ? Essential hypertension Raquel Sarna like  ? h/o Proteus pneumonia ([place]) 02/17/2021  ? Heart failure ([place])   ? Unknown details.  By report no cardiac surgery  ? Severe hypoxic-ischemic encephalopathy   ? Venous stasis dermatitis of left lower extremity   ? ? ?Past Surgical History:  ?Procedure Laterality Date  ? LEFT HEART CATH AND CORONARY ANGIOGRAPHY N/A 12/25/2020  ? Procedure: LEFT HEART CATH AND CORONARY ANGIOGRAPHY;  Surgeon: Leonie Man, MD;  Location: [facility];  Service: Cardiovascular;  Laterality: N/A; (In setting of Acute Hypoxic and Hypercapnic Respiratory Failure with Flash Pulm Edema/Hypertensive Emergency) - Angiographically minimal CAD.  Moderately reduced LV function with EF of 35 to 45%. LV P-EDP 101/21 mmHg - 30 mmHg  ? LEFT HEART CATH AND CORONARY ANGIOGRAPHY  09/12/2018  ? [facility]): Ostial LAD ~20% otherwise normal coronary arteries. LVEDP 20 mmHg;- HTN Emergency related Type II MI>  ? PEG PLACEMENT N/A 01/11/2021  ? Procedure: PERCUTANEOUS ENDOSCOPIC GASTROSTOMY (PEG) PLACEMENT;  Surgeon: Lucilla Lame, MD;  Location:  [facility];  Service: Endoscopy;  Laterality: N/A;  ? RIGHT HEART CATH N/A 12/25/2020  ? Procedure: RIGHT HEART CATH;  Surgeon: Leonie Man, MD;  Location: [facility];  Service: Cardiovascular; R IJ: (Acute Hypoxic and Hypercapnic Respiratory Failure with Flash Pulm Edema/Hypertensive Emergency): CO-CI 6.54-2.82. Mild Secondary Pulmonary  Hypertension (PCWP 30 mmHg.  PAP 47/13 mmHg with a mean PAP of 33 mmHg, RAP 22 mmHg in setting of AoP-MAP 97/59 mmHg - 71 mmHg,  ? TRACHEOSTOMY TUBE PLACEMENT N/A 01/12/2021  ? Procedure: TRACHEOSTOMY;  Surgeon: Carloyn Manner, MD;  Location: [facility];  Service: ENT;  Laterality: N/A;  ? TRANSTHORACIC ECHOCARDIOGRAM  09/11/2018  ? [facility]: Moderately reduced LVEF ~35%. 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 Mild-mod AI, Trivial MR?TR. LVEDD 6.0 (reduced compared to 2017: EF ~40-45%, LVEDD 5.6  ? TRANSTHORACIC ECHOCARDIOGRAM  12/27/2020  ? [facility]: (In Setting of Acute Hypoxic and Hypercapnic Respiratory Failure with Flash Pulm Edema/Hypertensive Emergency)  EF 45-50%. Mildly reduced LVEF with global HK. Moderately dilated LV with Gr 1 DD - mild LA dilation. Normal RV size Fxn.  Normal [place]  ? TRANSTHORACIC ECHOCARDIOGRAM  03/07/2021  ? [facility]: EF improved to 60-65% with Nl LV Fxn & ~ normal diastolic fxn. Normal RV size & fxn. Mild MR & AI.  Normal LA size.  ? Unknown    ?  ? ?Home Medications:  ?Prior to Admission medications   ?Medication Sig Start Date End Date Taking? Authorizing Provider  ?carvedilol (COREG) 6.25 MG tablet Take 1 tablet (6.25 mg total) by mouth 2 (two) times daily with a meal. 03/08/21  Yes Lavina Hamman, MD  ?clonazePAM (KLONOPIN) 0.5 MG disintegrating tablet Take 0.25 mg by mouth 2 (two) times daily. 06/01/21  Yes [provider]  ?diclofenac Sodium (VOLTAREN) 1 % GEL Apply 2 g topically 4 (four) times daily. 03/08/21  Yes Lavina Hamman, MD  ?FLOMAX 0.4 MG CAPS capsule Take 0.4 mg by mouth daily. 10/12/21  Yes [provider]  ?furosemide (LASIX) 20 MG tablet Take 20 mg by mouth daily.   Yes [provider]  ?levETIRAcetam (KEPPRA) 500 MG tablet Take 1 tablet (500 mg total) by mouth 2 (two) times daily. 04/08/21  Yes Lavonia Drafts, MD  ?polyethylene glycol (MIRALAX / GLYCOLAX) 17 g packet Take 17 g by mouth every other day.   Yes [provider]  ?potassium chloride (KLOR-CON) 10 MEQ tablet Take 10 mEq by mouth daily.   Yes [provider]  ?traZODone (DESYREL) 50 MG tablet Take 25 mg by mouth at bedtime as needed. 09/15/21  Yes [provider]  ?zinc oxide 20 % ointment Apply 1 application. topically 2 (two) times daily as needed.   Yes [provider]  ?ZOLOFT 25 MG tablet Take 25 mg by mouth daily. 06/02/21  Yes [provider]  ?aspirin EC 81 MG tablet Take 81 mg by mouth daily. Swallow whole. ?Patient not taking: Reported on 11/07/2021    [provider]  ?atorvastatin (LIPITOR) 80 MG tablet Take 80 mg by mouth daily. ?Patient not taking: Reported on 11/07/2021    [provider]  ?cefadroxil (DURICEF) 500 MG capsule Take 500 mg by mouth 2 (two) times daily. ?Patient not taking: Reported on 09/08/2021 06/29/21   [provider]  ?doxycycline (VIBRAMYCIN) 100 MG capsule Take 100 mg by mouth 2 (two) times daily. ?Patient not taking: Reported on 09/08/2021    [provider]  ?famotidine (PEPCID) 20 MG  tablet Take 1 tablet (20 mg total) by mouth daily. ?Patient not taking: Reported on 11/07/2021 03/09/21   Lavina Hamman, MD  ?Multiple Vitamin (MULTIVITAMIN WITH MINERALS) TABS tablet Place 1 tablet into feeding tube daily. ?Patient not taking: Reported on 09/08/2021 03/09/21   Lavina Hamman, MD  ?Nutritional Supplements (FEEDING SUPPLEMENT, NEPRO CARB STEADY,) LIQD Take 237 mLs by mouth 3 (three) times daily between meals. 03/08/21   Lavina Hamman, MD  ?simethicone (MYLICON) 80 MG chewable tablet Chew 1 tablet (80 mg total) by mouth 4 (four) times daily. ?Patient not taking: Reported on 11/07/2021 03/08/21   Lavina Hamman, MD  ? ? ?Inpatient Medications: ?Scheduled Meds: ? ?Continuous Infusions: ? heparin 1,300 Units/hr (11/07/21 0700)  ? lactated ringers 125 mL/hr at 11/07/21 0700  ? levETIRAcetam    ? ?PRN Meds: ? ? ?Allergies:   No Known Allergies ? ?Social History:    ?Social History  ? ?Socioeconomic History  ? Marital status: Widowed  ?  Spouse name: Not on file  ? Number of children: 0  ? Years of education: 3  ? Highest education level: Not on file  ?Occupational History  ? Not

## 2021-11-07 NOTE — Assessment & Plan Note (Addendum)
--  neuro consulted ?Continue Keppra 1000 mg IV twice daily ?Ativan as needed breakthrough ? ?

## 2021-11-07 NOTE — Assessment & Plan Note (Signed)
CPAP nightly

## 2021-11-07 NOTE — Progress Notes (Signed)
?  Progress Note ? ? ?Patient: Jeremy Browning [id number] DOB: 03/05/1961 DOA: 11/06/2021     0 ?DOS: the patient was seen and examined on 11/07/2021 ?  ?Brief hospital course: ?No notes on file ? ?Assessment and Plan: ?* Breakthrough seizure ([place]) ?--neuro consulted ?Continue Keppra 1000 mg IV twice daily ?Ativan as needed breakthrough ? ? ?Seizure disorder as sequela of cerebrovascular accident [facility]) ?Management as above ? ?NSTEMI (non-ST elevated myocardial infarction) ([place]) ?--trop 466 on presentation, then 3163, 2047.  Pt denied chest pain.   ?- Cath 12/2020 with normal coronary arteries ?--cardio consulted, started on heparin gtt ?Plan: ?--cont heparin gtt for now ?--Echo ? ?Stage 3a chronic kidney disease ([place]) ?Renal function at baseline ? ?COPD (chronic obstructive pulmonary disease) ([place]) ?DuoNebs as needed ? ?Controlled type 2 diabetes mellitus without complication, without long-term current use of insulin (H[facility]) ?--no need for SSI ? ?OSA (obstructive sleep apnea) ?CPAP nightly ? ?HFrEF (heart failure with reduced ejection fraction) ([place]) ?EF 45 to 50% in May 2022 in the setting of out of hospital V. tach arrest with cardiogenic shock, improved to 60-65 by July 2022 ?Plan: ?--resume home coreg and lasix ? ?Lactic acidosis ?Suspect secondary to seizure and not sepsis ?Received vancomycin, cefepime and Flagyl in the ED ?--improved with IVF ?Will hold off on further antibiotics for now and continue to monitor for signs of possible sepsis ? ? ? ? ? ?  ? ?Subjective:  ?Pt woke up today.  Denied chest pain.  Passed bed-side swallow. ? ? ?Physical Exam: ? ?Constitutional: NAD, AAOx3 ?HEENT: conjunctivae and lids normal, EOMI ?CV: No cyanosis.   ?RESP: normal respiratory effort, on RA ?SKIN: warm, dry ?Neuro: II - XII grossly intact.   ? ? ?Data Reviewed: ? ?Family Communication:  ? ?Disposition: ?Status is: Inpatient ?Remains inpatient appropriate because: monitor for seizure ? ? Planned Discharge Destination:  Skilled nursing facility ? ? ? ?Time spent: 50 minutes ? ?Author: ?Enzo Bi, MD ?11/07/2021 5:29 PM ? ?For on call review [email or web address].  ?

## 2021-11-07 NOTE — Assessment & Plan Note (Addendum)
EF 45 to 50% in May 2022 in the setting of out of hospital V. tach arrest with cardiogenic shock, improved to 60-65 by July 2022 ?Plan: ?--resume home coreg and lasix ?

## 2021-11-07 NOTE — Progress Notes (Signed)
PHARMACY -  BRIEF ANTIBIOTIC NOTE  ? ?Pharmacy has received consult(s) for Vanc, Cefepime from an ED provider.  The patient's profile has been reviewed for ht/wt/allergies/indication/available labs.   ? ?One time order(s) placed for Vancomycin 2 gm IV X 1 and Cefepime 2 gm IV X 1. ? ?Further antibiotics/pharmacy consults should be ordered by admitting physician if indicated.       ?                ?Thank you, ?Daisa Stennis D ?11/07/2021  1:07 AM ? ?

## 2021-11-07 NOTE — Consult Note (Signed)
[facility] Tele[facility]urology Consult Services ? ?Stat Consult ? ?Patient Name:   Jeremy Browning, Jeremy Browning ?Date of Birth:   12-Jan-1961 ?Identification Number:   MRN - [id number] ?Date of Service:   11/07/2021 00:58:32 ? ?Diagnosis: ?      R56.9 - Seizures ? ?Impression ?Jeremy Browning is a 61 yo M w/ a hx of stroke and seizure who presents with spell concerning for seizure. n my exam, patient was lethargic, did not follow commands. No obvious focal weakness. Head CT showed no acute intracranial pathology. Presentation consistent with seizure. 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 Recommend the following: ?- stat EEG ?- increase maintenance dose of Keppra to 1000 mg bid ?- Goal BP is normotension ?- ASA ?- Neurology to follow ? ?Our recommendations are outlined below. ? ?Diagnostic Studies: ?Stat EEG ?Please order ?Continuous EEG Monitoring ?Please order ? ?Nursing Recommendations: ?Neuro checks q1-2 hrs during ICU stay if critical ?Once stable neuro checks q 4hrs ? ?Seizure precautions: ?Seizure precautions including no driving for state mandated time frame were discussed with patient with clear understanding ? ?Disposition: ?Neurology will follow ? ?Free Text: ?Increase Keppra to 1g bid ? ? ? ?---------------------------------------------------------------------------------------------------- ?Advanced Imaging: ?Advanced Imaging Not Completed because: ? ?low suspicion for LVO ? ? ? ?Metrics: ?[facility] Notification Time: 11/07/2021 00:57:30 ?Stamp Time: 11/07/2021 00:58:32 ?Callback Response Time: 11/07/2021 00:59:40 ? ? ?CT HEAD: ?Reviewed ?old R MCA infarct ? ? ? ?---------------------------------------------------------------------------------------------------- ? ?Chief Complaint: ?spell concerning for seizure ? ?History of Present Illness: ?Patient is a 61 year old Male. ?Jeremy Browning is a 61 yo M w/ a hx of stroke and seizure who presents with spell  concerning for seizure. This evening, had an episode of LOC with limb stiffening and rhythmic jerking. In the ED, had periodic episodes or left gaze deviation. Ultimately, received Versed 5 mg iv and later 2 mg. Received Keppra 1500 mg iv. On my exam, patient was lethargic, did not follow commands. No obvious focal weakness. Head CT showed no acute intracranial pathology. ? ? ?Past Medical History: ?     Stroke ?     Seizures ? ?Medications: ? ?No Anticoagulant use  ?Antiplatelet use: Yes ASA ?Reviewed EMR for current medications ? ?Allergies:  ?Reviewed ? ?Social History: ?Unable To Obtain Due To Patient Status : Patient Cannot Speak ? ?Family History: ? ?There is no family history of premature cerebrovascular disease pertinent to this consultation ? ?ROS : ?14 Points Review of Systems was performed and was negative except mentioned in HPI. ? ?Past Surgical History: ?There Is No Surgical History Contributory To Today?s Visit ? ? ?Examination: ?BP(134/64), Pulse(123), Blood Glucose(171) ? ?Neuro Exam: ? ?General: Lethargic, did not follow commands ? ?Speech: nonverbal ? ?Face: no obvious facial asymmetry ? ?Extraocular Movements: roving eye movements ? ?Motor Exam: minimal spontaneous movement in all extremities in response to pain ? ?Sensation: Intact: ? ? ? ? ?Patient / Family was informed the Neurology Consult would occur via TeleHealth consult by way of interactive audio and video telecommunications and consented to receiving care in this manner. ? ?Patient is being evaluated for possible acute neurologic impairment and high probability of imminent or life - threatening deterioration.I spent total of 35 minutes providing care to this patient, including time for face to face visit via telemedicine, review of medical records, imaging studies and discussion of findings with providers, the patient and / or family. ? ? ?Dr Jeremy Browning ? ? ?[facility] ?[phone] ? ?Case [id number] ?

## 2021-11-07 NOTE — Consult Note (Signed)
Neurology Consultation ? ?Reason for Consult: Seizure ?Referring Physician: Dr. Lindajo Royal ? ?CC: Seizures ? ?History is obtained from: Chart, patient ? ?HPI: Jeremy Browning is a 61 y.o. 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 tach arrest with anoxic brain injury and stroke at that point-May 2022, heart failure with reduced ejection fraction with improved EF now 60 to 65% in July 2022, CKD 3, who was in a SNF, brought in by EMS for 2 witnessed generalized tonic-clonic seizures.  Seen emergently by telemedicine neurology and was not following commands at that time.  Initial recommendation was to transfer for continuous EEG but over his stay in the emergency room after giving him IV antiepileptics-Keppra, he started to come around and follow commands.  Decision was made to keep the patient over at [facility] and obtain spot EEG and further recommendations to be provided on clinical course and testing results. ?Patient today was sleeping in bed, knows that he is in the hospital, is able to tell me that the month is March but could not tell me the year.  He tells me that he remembers that he was brought in for seizures but he does not remember the seizures.  He cannot tell me if he was compliant to medications or not. ?He was afebrile, tachycardic with troponin elevation and lactic acidosis on arrival. ?Lactic acidosis is improved. ?Cardiology has been consulted for the troponinemia due to his extensive cardiac history.  He is on IV heparin for the troponinemia, which cardiology suspects might be related to the seizure and lactic acidosis.  Patient has no symptoms of chest pain ? ?ROS: Full ROS was performed and is negative except as noted in the HPI.  ? ?Past Medical History:  ?Diagnosis Date  ? Diabetes mellitus type II, non insulin dependent (HCC)   ? Essential hypertension Irving Burton like  ? h/o Proteus pneumonia (HCC)  02/17/2021  ? Heart failure (HCC)   ? Unknown details.  By report no cardiac surgery  ? Severe hypoxic-ischemic encephalopathy   ? Venous stasis dermatitis of left lower extremity   ? ?Family History  ?Problem Relation Age of Onset  ? Asthma Mother   ? Hypertension Mother   ?     LIkely Severe/Malignant  ? Kidney failure Mother   ?     ESRD - HD  ? CVA Mother   ? Alcohol abuse Father   ? Depression Father   ? Asthma Sister   ? Hypertension Sister   ? Diabetes Mellitus II Sister   ? Asthma Brother   ? Hypertension Brother   ? ? ? ?Social History:  ? reports that he quit smoking about 3 years ago. His smoking use included cigarettes. He has a 3.50 pack-year smoking history. He has never used smokeless tobacco. He reports that he does not currently use alcohol. He reports that he does not currently use drugs. ? ?Medications ? ?Current Facility-Administered Medications:  ?  heparin ADULT infusion 100 units/mL (25000 units/266mL), 1,300 Units/hr, Intravenous, Continuous, Gilles Chiquito, MD, Last Rate: 13 mL/hr at 11/07/21 0800, 1,300 Units/hr at 11/07/21 0800 ?  lactated ringers infusion, , Intravenous, Continuous, Gilles Chiquito, MD, Last Rate: 125 mL/hr at 11/07/21 0800, Infusion Verify at 11/07/21 0800 ?  levETIRAcetam (KEPPRA) IVPB 1000 mg/100 mL premix, 1,000 mg, Intravenous, Q12H, Gilles Chiquito, MD ? ? ?Exam: ?Current vital signs: ?BP (!) 154/91   Pulse 73  Temp 98.7 ?F (37.1 ?C) (Oral)   Resp 17   Ht 5\' 8"  (1.727 m)   Wt 93.7 kg   SpO2 97%   BMI 31.41 kg/m?  ?Vital signs in last 24 hours: ?Temp:  [98.3 ?F (36.8 ?C)-99.2 ?F (37.3 ?C)] 98.7 ?F (37.1 ?C) (03/20 0800) ?Pulse Rate:  [69-123] 73 (03/20 0900) ?Resp:  [11-26] 17 (03/20 0900) ?BP: (133-164)/(52-91) 154/91 (03/20 0900) ?SpO2:  [93 %-98 %] 97 % (03/20 0900) ?Weight:  [93.7 kg-113.4 kg] 93.7 kg (03/20 0606) ?General: Comfortably sleeping in bed, awakens to voice, follows commands. ?HEENT: Normocephalic atraumatic ?Lungs: Clear ?Cardiovascular:  Regular rate rhythm ?Abdomen nondistended nontender ?Extremities: Left upper extremity contractures, IO in place in the right leg. ?Neurological exam ?Once awake, he is able to tell me his name, is able to tell me he is in the hospital and that the month is March.  He was not able to tell me his correct age or the current year. ?He is able to follow commands. ?He is able to repeat although has poor attention concentration ?Speech is mildly dysarthric ?Cranial nerves: Pupils are equal, visual fields full, left lower facial weakness seen, extraocular movements intact, tongue and palate midline. ?Motor examination with significant increased tone in the left upper extremity and barely antigravity of the left shoulder with more distal weakness barely 1/5 in handgrip.  Right upper extremity is full strength of right leg unable to lift antigravity.  Left leg better movement to noxious stimulation. ?Sensation: Diminished on the left side and has extinction ?Coordination with no obvious dysmetria ? ? ?Labs ?I have reviewed labs in epic and the results pertinent to this consultation are: ?Creatinine 1.63 ?Troponin 3163, up from 466. ?Lactic acid-2.7, down from 5.7. ?Procalcitonin less than 0.1 ?A1c 7.1 ?TSH 4.6 ?Ammonia 36 ? ? ?CBC ?   ?Component Value Date/Time  ? WBC 7.4 11/06/2021 2305  ? RBC 5.66 11/06/2021 2305  ? HGB 13.8 11/06/2021 2305  ? HCT 45.3 11/06/2021 2305  ? PLT 208 11/06/2021 2305  ? MCV 80.0 11/06/2021 2305  ? MCH 24.4 (L) 11/06/2021 2305  ? MCHC 30.5 11/06/2021 2305  ? RDW 15.0 11/06/2021 2305  ? LYMPHSABS 1.7 11/06/2021 2305  ? MONOABS 0.4 11/06/2021 2305  ? EOSABS 0.4 11/06/2021 2305  ? BASOSABS 0.0 11/06/2021 2305  ? ? ?CMP  ?   ?Component Value Date/Time  ? NA 140 11/06/2021 2305  ? K 4.7 11/06/2021 2305  ? CL 106 11/06/2021 2305  ? CO2 21 (L) 11/06/2021 2305  ? GLUCOSE 171 (H) 11/06/2021 2305  ? BUN 15 11/06/2021 2305  ? CREATININE 1.63 (H) 11/06/2021 2305  ? CREATININE 1.39 (H) 08/11/2021 1203  ?  CALCIUM 8.7 (L) 11/06/2021 2305  ? PROT 7.5 11/06/2021 2305  ? ALBUMIN 3.4 (L) 11/06/2021 2305  ? AST 41 11/06/2021 2305  ? ALT 18 11/06/2021 2305  ? ALKPHOS 105 11/06/2021 2305  ? BILITOT 1.4 (H) 11/06/2021 2305  ? GFRNONAA 48 (L) 11/06/2021 2305  ? ? ?Lipid Panel  ?   ?Component Value Date/Time  ? CHOL 149 12/26/2020 0420  ? TRIG 138 01/12/2021 0401  ? HDL 36 (L) 12/26/2020 0420  ? CHOLHDL 4.1 12/26/2020 0420  ? VLDL 18 12/26/2020 0420  ? LDLCALC 95 12/26/2020 0420  ? ?Urinalysis unremarkable ? ?Imaging ?I have reviewed the images obtained: ? ?CT-head-no acute changes-progressive encephalomalacia in the right MCA territory, mostly at the site of old infarct with ex vacuo dilatation of the right lateral ventricle.  Also visualized old left occipital infarct.  No hemorrhage. ? ?Chest x-ray with low lung volumes with bibasilar atelectasis ? 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 tach arrest with reduced ejection fraction which is now improved, CKD 3, brought in for 2 witnessed seizures.  Remained postictal for an extended.  Of time, initial concern for nonconvulsive status epilepticus for which transfer to [facility] was contemplated but exam started to improve when he started following commands, and was admitted to the ICU under the hospital service at [facility]. ?At this time, based on my comparison with prior neurology notes, his exam seems to be baseline with residual left hemiparesis and left-sided sensory deficits consistent with a prior right MCA infarct.  Imaging does not show any new changes-shows progressive encephalomalacia of the right MCA territory where the strokes were seen last year.  No evidence of bleed. ?Keppra levels were not drawn at the  time of arrival so I am not sure of the compliance but given the fact that he is a skilled nursing facility, I would expect compliance to not be an issue. ?His dose was increased and is currently on Keppra 1000 twice da

## 2021-11-07 NOTE — ED Notes (Signed)
Paged [facility] Mayford Knife) for [name], MD ?

## 2021-11-07 NOTE — Progress Notes (Signed)
ANTICOAGULATION CONSULT NOTE ? ?Pharmacy Consult for IV Heparin  ?Indication: ACS/NSTEMI  ? ?Patient Measurements: ?Height: 5\' 8"  (172.7 cm) ?Weight: 93.7 kg (206 lb 9.1 oz) ?IBW/kg (Calculated) : 68.4 ?Heparin Dosing Weight:  88 kg ? ?Labs: ?Recent Labs  ?  11/06/21 ?2305 11/07/21 ?0105 11/07/21 ?1024 11/07/21 ?1244 11/07/21 ?1801  ?HGB 13.8  --   --   --   --   ?HCT 45.3  --   --   --   --   ?PLT 208  --   --   --   --   ?APTT 31  --   --   --   --   ?LABPROT 15.3*  --   --   --   --   ?INR 1.2  --   --   --   --   ?HEPARINUNFRC  --   --  0.94*  --  0.93*  ?CREATININE 1.63*  --   --   --   --   ?TROPONINIHS [id number]*  --  2,047*  --   ? ? ?Estimated Creatinine Clearance: 52.8 mL/min (A) (by C-G formula based on SCr of 1.63 mg/dL (H)). ? ?Medical History: ?Past Medical History:  ?Diagnosis Date  ? Diabetes mellitus type II, non insulin dependent ([place])   ? Essential hypertension Raquel Sarna like  ? h/o Proteus pneumonia ([place]) 02/17/2021  ? Heart failure ([place])   ? Unknown details.  By report no cardiac surgery  ? Severe hypoxic-ischemic encephalopathy   ? Venous stasis dermatitis of left lower extremity   ? ?Assessment: ?Patient is a 61 y/o M with medical history including T2DM, HTN, OSA, history of OOH V tach arrest in 05/22 with asymmetric anoxic brain injury and CVA with residual left hemiparesis and seizure disorder, HFrEF with recovered EF, CKD stage III who is admitted with breakthru seizures. Troponin also elevated. There is concern for ACS. Pharmacy consulted to initiate and monitor heparin infusion for ACS. ? ?Goal of Therapy:  ?Heparin level 0.3-0.7 units/ml ?Monitor platelets by anticoagulation protocol: Yes ?  ?Plan:  ?--3/20@1801 : HL 0.93, supratherapeutic ?--Decrease heparin infusion to 900 units/hr ?--Re-check HL 6 hours from rate change ?--Daily CBC per protocol while on IV heparin ? ?Kadeem Hyle A Orren Pietsch ?11/07/2021,6:51 PM ? ? ?

## 2021-11-07 NOTE — Progress Notes (Signed)
CODE SEPSIS - PHARMACY COMMUNICATION ? ?**Broad Spectrum Antibiotics should be administered within 1 hour of Sepsis diagnosis** ? ?Time Code Sepsis Called/Page Received: 3/20 @ 0007  ? ?Antibiotics Ordered: Vanc, Cefepime  ? ?Time of 1st antibiotic administration: Cefepime 2 gm IV X 1 on 3/20 @ 0102 ? ?Additional action taken by pharmacy:  ? ?If necessary, Name of Provider/Nurse Contacted:  ? ? ? ?Myisha Pickerel D ,PharmD ?Clinical Pharmacist  ?11/07/2021  1:05 AM ? ?

## 2021-11-07 NOTE — Progress Notes (Signed)
*  PRELIMINARY RESULTS* ?Echocardiogram ?2D Echocardiogram has been performed. ? ?Jeremy Browning [name] ?11/07/2021, 2:01 PM ?

## 2021-11-07 NOTE — ED Notes (Signed)
Called [facility] (Tammy) to request hospitalist be repaged ?

## 2021-11-07 NOTE — ED Notes (Signed)
Called Tammy @ [facility] to page out Hospitalist [place] ?

## 2021-11-07 NOTE — Assessment & Plan Note (Addendum)
--  trop 466 on presentation, then 3163, 2047.  Pt denied chest pain.   ?- Cath 12/2020 with normal coronary arteries ?--cardio consulted, started on heparin gtt ?Plan: ?--cont heparin gtt for now ?--Echo ?

## 2021-11-07 NOTE — Procedures (Signed)
Patient Name: Jeremy Browning  ?MRN: [id number]  ?Epilepsy Attending: Charlsie Quest  ?Referring Physician/Provider: Erick Blinks, MD ?Date:11/07/2021 ?Duration: 21.33 mins ? 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 tach arrest with reduced ejection fraction which is now improved, CKD 3, brought in for 2 witnessed seizures.  EEG to evaluate for seizure ? ?Level of alertness: Awake, asleep ? ?AEDs during EEG study: LEV ? ?Technical aspects: This EEG study was done with scalp electrodes positioned according to the 10-20 International system of electrode placement. Electrical activity was acquired at a sampling rate of 500Hz  and reviewed with a high frequency filter of 70Hz  and a low frequency filter of 1Hz . EEG data were recorded continuously and digitally stored.  ? ?Description: The posterior dominant rhythm consists of 8Hz  activity of moderate voltage (25-35 uV) seen predominantly in posterior head regions, asymmetric ( right<left) and reactive to eye opening and eye closing. Sleep was characterized by vertex waves, sleep spindles (12 to 14 Hz), maximal frontocentral region. EEG showed continuous 3 to 6 Hz theta-delta slowing in right hemisphere as well as intermittent generalized 3-5hz  theta-delta slowing. Hyperventilation and photic stimulation were not performed.    ? ?ABNORMALITY ?- Continuous slow, right hemisphere ?- Intermittent slow, generalized ?- Background asymmetry, right<left ? ?IMPRESSION: ?This study is suggestive of cortical dysfunction arising from right hemisphere likely secondary to underlying structural abnormality. Additionally, there is ?Mild diffuse encephalopathy, nonspecific etiology. No seizures or epileptiform discharges were seen throughout the  recording. ? ?  ? ?

## 2021-11-07 NOTE — Assessment & Plan Note (Addendum)
Suspect secondary to seizure and not sepsis ?Received vancomycin, cefepime and Flagyl in the ED ?--improved with IVF ?Will hold off on further antibiotics for now and continue to monitor for signs of possible sepsis ? ?

## 2021-11-07 NOTE — Progress Notes (Signed)
Brief Phone discussion with Dr. Katrinka Blazing with the ED team at [facility]. ? ?Briefly, Jeremy Browning is a 61 y.o. male who has a hx of prior large R MCA stroke with residual baseline L sided weakness but able to talk. He also has a hx of seizure and is on Keppra 500mg  BID. He was brought in to the ED after he had a seizure at his facility. EMS also noted GTCs x 2 and he was given Versed 5mg  IM. He was noted to have L eye deviation/preference and was grinding teeth and was given Ativan 4mg . No clinical seizure activity noted at this time. HE had teleneurology evaluate patient. He was also noted to have fever with Tmax of 101. No fever in the ED. He is obtunded mentation at this time but is also DNR. Teleneurology recommended transfer to [facility] for a cEEG. ? ?Recs: ?- I think it is reasonable to transfer him to [facility] for a cEEG. Althou, we would not be able to use propofol/versed or other sedating meds, we can still escalate his AEDs based on cEEG. ?- He was given Keppra 1500mg  IV once in the ED, recommend additional Keppra 1500mg  IV once and to increase his maintenance Keppra to 1000mg  BID. ?- Ativan 1 - 2 mg for any seizure activity lasting more than 3 mins. ?- Please page neurologist on call at [facility] to let [facility] know when he is at [facility]. If the patient is still at [facility] after 8AM in the morning, please reach out to [facility] neurologist covering [facility]. ? ?[facility] ?[facility] ?Pager Number [phone] ?

## 2021-11-07 NOTE — Sepsis Progress Note (Signed)
[facility] following code sepsis °

## 2021-11-07 NOTE — Progress Notes (Signed)
Brief Neuro Update: ? ?Was notified by Dr. Tamala Julian that patient is now awake and following commands. Complaining of L knee pain. No clinical concerns for seizures at this time. No utility of cEEG at this time given improvement. No need to transfer him to [facility] at this time. He can be admitted to [facility] and have neurology consult in AM and get a routine EEG there. ? ?Overnight: ?- Ativan 1-2 mg PRN for any seizure lasting more than 3 mins ?- Seizure precautions ?- continue keppra 1000mg  BID ? ? ?Jeremy Browning ?[facility] ?Pager Number [phone] ? ?

## 2021-11-07 NOTE — H&P (Signed)
?History and Physical  ? ? ?Patient: Jeremy Browning [id number] DOB: [birth year] ?DOA: 11/06/2021 ?DOS: the patient was seen and examined on 11/07/2021 ?PCP: Townsend Roger, MD  ?Patient coming from: SNF ? ?Chief Complaint:  ?Chief Complaint  ?Patient presents with  ? Seizures  ? ? ?HPI: Jeremy Browning is a 61 y.o. male with medical history significant for DM, HTN,, OSA, history of out of hospital V. tach arrest in May 2022 with asymmetric anoxic brain injury and CVA with residual left hemiparesis but able to talk, and residual seizure disorder, HFrEF with improved EF, now 60 to 65% July 2022, CKD 3, obesity, currently residing in skilled rehab, who presented by EMS after having 2 witnessed generalized tonic-clonic seizures at the skilled nursing facility.  Patient was obtunded on arrival in the ED and was noted to have left eye deviation/preference.  He was treated with Ativan and loaded with Keppra 1500 mg IV.  Head CT showed no acute intracranial abnormality.  He was evaluated by teleneurology who recommended transfer to [facility] for continuous EEG.  While in the ED, patient became more responsive and was able to follow commands, complaining of knee pain.  Neurologist Dr. Lorrin Goodell change recommendation at this point from transferred to [facility] for continuous EEG recommendation for admission to hospitalist team ? ?Data reviewed: On arrival afebrile tachycardic to 115 tachypneic to 26 with BP 134/64 and O2 sat 96% on room air.  Blood work significant for troponin [phone] with procalcitonin less than 0.10 ?CBC 7400 with lactic acid 5.7-2.7.  Urinalysis unremarkable and COVID and flu negative ?EKG, personally viewed and interpreted: Sinus tachycardia with left bundle branch block ?CT head nonacute, chest x-ray clear, x-ray of the left knee showing degenerative changes left tib-fib x-ray negative. ? ?Patient was treated in the emergency room for possible sepsis with cefepime Flagyl and vancomycin, and started on  heparin infusion for NSTEMI.  Hospitalist consulted for admission.  ?Review of Systems  ?Unable to perform ROS: Critical illness  ? ?Past Medical History:  ?Diagnosis Date  ? Diabetes mellitus type II, non insulin dependent ([place])   ? Essential hypertension Raquel Sarna like  ? h/o Proteus pneumonia ([place]) 02/17/2021  ? Heart failure ([place])   ? Unknown details.  By report no cardiac surgery  ? Severe hypoxic-ischemic encephalopathy   ? Venous stasis dermatitis of left lower extremity   ? ?Past Surgical History:  ?Procedure Laterality Date  ? LEFT HEART CATH AND CORONARY ANGIOGRAPHY N/A 12/25/2020  ? Procedure: LEFT HEART CATH AND CORONARY ANGIOGRAPHY;  Surgeon: Leonie Man, MD;  Location: [facility];  Service: Cardiovascular;  Laterality: N/A; (In setting of Acute Hypoxic and Hypercapnic Respiratory Failure with Flash Pulm Edema/Hypertensive Emergency) - Angiographically minimal CAD.  Moderately reduced LV function with EF of 35 to 45%. LV P-EDP 101/21 mmHg - 30 mmHg  ? LEFT HEART CATH AND CORONARY ANGIOGRAPHY  09/12/2018  ? [facility]): Ostial LAD ~20% otherwise normal coronary arteries. LVEDP 20 mmHg;- HTN Emergency related Type II MI>  ? PEG PLACEMENT N/A 01/11/2021  ? Procedure: PERCUTANEOUS ENDOSCOPIC GASTROSTOMY (PEG) PLACEMENT;  Surgeon: Lucilla Lame, MD;  Location: [facility] ENDOSCOPY;  Service: Endoscopy;  Laterality: N/A;  ? RIGHT HEART CATH N/A 12/25/2020  ? Procedure: RIGHT HEART CATH;  Surgeon: Leonie Man, MD;  Location: [facility];  Service: Cardiovascular; R IJ: (Acute Hypoxic and Hypercapnic Respiratory Failure with Flash Pulm Edema/Hypertensive Emergency): CO-CI 6.54-2.82. Mild Secondary Pulmonary Hypertension (PCWP 30 mmHg.  PAP 47/13 mmHg  with a mean PAP of 33 mmHg, RAP 22 mmHg in setting of AoP-MAP 97/59 mmHg - 71 mmHg,  ? TRACHEOSTOMY TUBE PLACEMENT N/A 01/12/2021  ? Procedure: TRACHEOSTOMY;  Surgeon: Carloyn Manner, MD;  Location: [facility] ORS;  Service: ENT;  Laterality: N/A;  ?  TRANSTHORACIC ECHOCARDIOGRAM  09/11/2018  ? [facility]: Moderately reduced LVEF ~35%. 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 Mild-mod AI, Trivial MR?TR. LVEDD 6.0 (reduced compared to 2017: EF ~40-45%, LVEDD 5.6  ? TRANSTHORACIC ECHOCARDIOGRAM  12/27/2020  ? [facility]: (In Setting of Acute Hypoxic and Hypercapnic Respiratory Failure with Flash Pulm Edema/Hypertensive Emergency)  EF 45-50%. Mildly reduced LVEF with global HK. Moderately dilated LV with Gr 1 DD - mild LA dilation. Normal RV size Fxn.  Normal [place]  ? TRANSTHORACIC ECHOCARDIOGRAM  03/07/2021  ? [facility]: EF improved to 60-65% with Nl LV Fxn & ~ normal diastolic fxn. Normal RV size & fxn. Mild MR & AI.  Normal LA size.  ? Unknown    ? ?Social History:  reports that he quit smoking about 3 years ago. His smoking use included cigarettes. He has a 3.50 pack-year smoking history. He has never used smokeless tobacco. He reports that he does not currently use alcohol. He reports that he does not currently use drugs. ? ?No Known Allergies ? ?Family History  ?Problem Relation Age of Onset  ? Asthma Mother   ? Hypertension Mother   ?     LIkely Severe/Malignant  ? Kidney failure Mother   ?     ESRD - HD  ? CVA Mother   ? Alcohol abuse Father   ? Depression Father   ? Asthma Sister   ? Hypertension Sister   ? Diabetes Mellitus II Sister   ? Asthma Brother   ? Hypertension Brother   ? ? ?Prior to Admission medications   ?Medication Sig Start Date End Date Taking? Authorizing Provider  ?aspirin EC 81 MG tablet Take 81 mg by mouth daily. Swallow whole.    [provider]  ?atorvastatin (LIPITOR) 80 MG tablet Take 80 mg by mouth daily.    [provider]  ?carvedilol (COREG) 6.25 MG tablet Take 1 tablet (6.25 mg total) by mouth 2 (two) times daily with a meal. 03/08/21   Lavina Hamman, MD  ?cefadroxil (DURICEF) 500 MG capsule Take 500 mg by mouth 2 (two) times daily. ?Patient not taking: Reported on 09/08/2021 06/29/21   [provider]   ?clonazePAM (KLONOPIN) 0.5 MG tablet Take 0.5 mg by mouth 2 (two) times daily. 06/01/21   [provider]  ?diclofenac Sodium (VOLTAREN) 1 % GEL Apply 2 g topically 4 (four) times daily. 03/08/21   Lavina Hamman, MD  ?doxycycline (VIBRAMYCIN) 100 MG capsule Take 100 mg by mouth 2 (two) times daily. ?Patient not taking: Reported on 09/08/2021    [provider]  ?famotidine (PEPCID) 20 MG tablet Take 1 tablet (20 mg total) by mouth daily. 03/09/21   Lavina Hamman, MD  ?furosemide (LASIX) 20 MG tablet Take 20 mg by mouth daily.    [provider]  ?levETIRAcetam (KEPPRA) 500 MG tablet Take 1 tablet (500 mg total) by mouth 2 (two) times daily. 04/08/21   Lavonia Drafts, MD  ?Multiple Vitamin (MULTIVITAMIN WITH MINERALS) TABS tablet Place 1 tablet into feeding tube daily. ?Patient not taking: Reported on 09/08/2021 03/09/21   Lavina Hamman, MD  ?Nutritional Supplements (FEEDING SUPPLEMENT, NEPRO CARB STEADY,) LIQD Take 237 mLs by mouth 3 (  three) times daily between meals. 03/08/21   Lavina Hamman, MD  ?potassium chloride (KLOR-CON) 10 MEQ tablet Take 10 mEq by mouth daily.    [provider]  ?simethicone (MYLICON) 80 MG chewable tablet Chew 1 tablet (80 mg total) by mouth 4 (four) times daily. 03/08/21   Lavina Hamman, MD  ?traZODone (DESYREL) 50 MG tablet Take 25 mg by mouth at bedtime as needed. 09/15/21   [provider]  ?ZOLOFT 25 MG tablet Take 25 mg by mouth daily. 06/02/21   [provider]  ? ? ?Physical Exam: ?Vitals:  ? 11/07/21 0230 11/07/21 0300 11/07/21 0400 11/07/21 0430  ?BP: 140/81 (!) 143/59 140/63 (!) 150/65  ?Pulse: 81 86 76 77  ?Resp: 16  18 15   ?Temp:      ?TempSrc:      ?SpO2: 97% 95% 97% 97%  ?Weight:  113.4 kg    ? ?Physical Exam ?Constitutional:   ?   Appearance: He is obese.  ?   Interventions: Nasal cannula in place.  ?Cardiovascular:  ?   Rate and Rhythm: Normal rate and regular rhythm.  ?Pulmonary:  ?   Effort: Pulmonary effort is  normal.  ?   Breath sounds: Normal breath sounds.  ?Neurological:  ?   Mental Status: He is lethargic.  ?   Comments: Left hemiparesis  ? ? ? ?Data Reviewed: ?Relevant notes from primary care and specialist

## 2021-11-08 DIAGNOSIS — I248 Other forms of acute ischemic heart disease: Secondary | ICD-10-CM | POA: Diagnosis not present

## 2021-11-08 DIAGNOSIS — I5023 Acute on chronic systolic (congestive) heart failure: Secondary | ICD-10-CM | POA: Diagnosis not present

## 2021-11-08 DIAGNOSIS — G40919 Epilepsy, unspecified, intractable, without status epilepticus: Secondary | ICD-10-CM | POA: Diagnosis not present

## 2021-11-08 LAB — CBC
HCT: 40.1 % (ref 39.0–52.0)
Hemoglobin: 12.6 g/dL — ABNORMAL LOW (ref 13.0–17.0)
MCH: 24.6 pg — ABNORMAL LOW (ref 26.0–34.0)
MCHC: 31.4 g/dL (ref 30.0–36.0)
MCV: 78.2 fL — ABNORMAL LOW (ref 80.0–100.0)
Platelets: 184 10*3/uL (ref 150–400)
RBC: 5.13 MIL/uL (ref 4.22–5.81)
RDW: 15.1 % (ref 11.5–15.5)
WBC: 7.8 10*3/uL (ref 4.0–10.5)
nRBC: 0 % (ref 0.0–0.2)

## 2021-11-08 LAB — URINE CULTURE

## 2021-11-08 LAB — BLOOD CULTURE ID PANEL (REFLEXED) - BCID2

## 2021-11-08 LAB — BASIC METABOLIC PANEL
Anion gap: 7 (ref 5–15)
BUN: 18 mg/dL (ref 8–23)
CO2: 23 mmol/L (ref 22–32)
Calcium: 8.5 mg/dL — ABNORMAL LOW (ref 8.9–10.3)
Chloride: 107 mmol/L (ref 98–111)
Creatinine, Ser: 1.49 mg/dL — ABNORMAL HIGH (ref 0.61–1.24)
GFR, Estimated: 53 mL/min — ABNORMAL LOW (ref >60)
Glucose, Bld: 111 mg/dL — ABNORMAL HIGH (ref 70–99)
Potassium: 3.9 mmol/L (ref 3.5–5.1)
Sodium: 137 mmol/L (ref 135–145)

## 2021-11-08 LAB — HEPARIN LEVEL (UNFRACTIONATED)
Heparin Unfractionated: 0.65 IU/mL (ref 0.30–0.70)
Heparin Unfractionated: 0.6 IU/mL (ref 0.30–0.70)

## 2021-11-08 LAB — LEVETIRACETAM LEVEL: Levetiracetam Lvl: 44.9 ug/mL — ABNORMAL HIGH (ref 10.0–40.0)

## 2021-11-08 LAB — MAGNESIUM: Magnesium: 2 mg/dL (ref 1.7–2.4)

## 2021-11-08 LAB — GLUCOSE, CAPILLARY: Glucose-Capillary: 116 mg/dL — ABNORMAL HIGH (ref 70–99)

## 2021-11-08 MED ORDER — LEVETIRACETAM 500 MG PO TABS: 1000.0000 mg | ORAL_TABLET | Freq: Two times a day (BID) | ORAL | Status: DC

## 2021-11-08 MED ORDER — CLONAZEPAM 0.125 MG PO TBDP
0.2500 mg | ORAL_TABLET | Freq: Two times a day (BID) | ORAL | Status: DC
  Administered 2021-11-08: 0.25 mg via ORAL
  Filled 2021-11-08: qty 2

## 2021-11-08 MED ORDER — LOSARTAN POTASSIUM 25 MG PO TABS: 25.0000 mg | ORAL_TABLET | Freq: Every day | ORAL | Status: DC

## 2021-11-08 MED ORDER — LOSARTAN POTASSIUM 50 MG PO TABS
25.0000 mg | ORAL_TABLET | Freq: Every day | ORAL | Status: DC
  Administered 2021-11-08: 25 mg via ORAL
  Filled 2021-11-08: qty 1

## 2021-11-08 NOTE — Discharge Summary (Addendum)
? ?Physician Discharge Summary ? ? ?Jeremy Browning  male DOB: Oct 07, 1960  ?[id number] ? ?PCP: Townsend Roger, MD ? ?Admit date: 11/06/2021 ?Discharge date: 11/08/2021 ? ?Admitted From: SNF ?Disposition:  SNF ?CODE STATUS: DNR ? ?Discharge Instructions   ? ? Ambulatory referral to Neurology   Complete by: As directed ?  ? An appointment is requested in approximately: 4-6 weeks  ? No wound care   Complete by: As directed ?  ? ?  ? ?Hospital Course:  ?For full details, please see H&P, progress notes, consult notes and ancillary notes.  ?Briefly,  ?Jeremy Browning is a 61 y.o. male with medical history significant for DM, HTN,, OSA, history of out of hospital V. tach arrest in May 2022 with asymmetric anoxic brain injury and CVA with residual left hemiparesis but able to talk, and residual seizure disorder, HFrEF, CKD 3, obesity, currently residing in skilled rehab, who presented by EMS after having 2 witnessed generalized tonic-clonic seizures at the skilled nursing facility.  Patient was obtunded on arrival in the ED and was noted to have left eye deviation/preference.  He was treated with Ativan and loaded with Keppra 1500 mg IV.  Head CT showed no acute intracranial abnormality. ? ?* Breakthrough seizure ([place]) ?Seizure disorder as sequela of cerebrovascular accident ([place]) and Anoxic brain injury ?--neuro consulted, increased home Keppra from 500 mg BID to 1000 mg BID.   ?--cont home Klonopin ?--ASA and statin were listed as "not taking" on home med list, however, pt should resume both for stroke prevention, per neuro rec. ?--Follow-up with outpatient neurology in [place] clinic in 4 to 6 weeks.   ?  ?Trop elevation 2/2 demand ischemia ?NSTEMI, ruled out ?--trop 466 on presentation, then 3163, 2047.  Pt denied chest pain.   ?- Cath 12/2020 with normal coronary arteries ?--cardio consulted, who deemed elevated troponin reflect supply-demand mismatch in the setting of acute illness.  No plans for invasive workup this  admission. ?--Per cardio, no need for aspirin and atorvastatin given absence of CAD on recent catheterization, but pt is discharged on ASA and statin for stroke prevention. ?  ?Stage 3a chronic kidney disease ([place]) ?Renal function at baseline ?  ?COPD (chronic obstructive pulmonary disease) ([place]) ?DuoNebs as needed ?  ?Controlled type 2 diabetes mellitus without complication, without long-term current use of insulin (H[facility]) ?--no need for SSI ?  ?OSA (obstructive sleep apnea) ?CPAP nightly ?  ?HFrEF (heart failure with reduced ejection fraction) ([place]) ?EF 45 to 50% in May 2022 in the setting of out of hospital V. tach arrest with cardiogenic shock, improved to 60-65 by July 2022.  However, during current admission, LVEF declined back down to 40-45%.  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 ?-Continue carvedilol 6.25 mg twice daily. ?-Add losartan 25 mg daily with close monitoring of renal function. ?-Continue furosemide 20 mg daily. ?  ?Lactic acidosis ?Suspect secondary to seizure and not sepsis ?Received vancomycin, cefepime and Flagyl in the ED, which were not continued. ?--improved with IVF ? 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 Sharol Given, this has been a chronic problem for years.  Pt originally had a scheduled surgery on 11/11/21 for LEFT TIBIA REMOVAL DEEP HARDWARE AND EXCISE INFECTED BONE with ortho Dr. Sharol Given.  I discussed with Dr. Sharol Given, who said the surgery will be post-poned and he will see pt in outpatient clinic.   ?--Pt had cefadroxil and doxycycline on his home  med list but both were listed as "not taking" PTA.  Per Dr. Sharol Given, pt is to continue both abx until followup with him in clinic. ? ? ?Discharge Diagnoses:  ?Principal Problem: ?  Breakthrough seizure ([place]) ?Active Problems: ?  Seizure disorder as sequela of cerebrovascular accident [facility]) ?  NSTEMI (non-ST elevated  myocardial infarction) ([place]) ?  Lactic acidosis ?  HFrEF (heart failure with reduced ejection fraction) ([place]) ?  OSA (obstructive sleep apnea) ?  Controlled type 2 diabetes mellitus without complication, without long-term current use of insulin ([place]) ?  COPD (chronic obstructive pulmonary disease) ([place]) ?  Stage 3a chronic kidney disease ([place]) ?  Demand ischemia ([place]) ?  Acute on chronic HFrEF (heart failure with reduced ejection fraction) ([place]) ? ? ?30 Day Unplanned Readmission Risk Score   ? ?Flowsheet Row ED to Hosp-Admission (Current) from 11/06/2021 in [facility]  ?30 Day Unplanned Readmission Risk Score (%) 18.41 Filed at 11/08/2021 0801  ? ?  ? ? This score is the patient's risk of an unplanned readmission within 30 days of being discharged (0 -100%). The score is based on dignosis, age, lab data, medications, orders, and past utilization.   ?Low:  0-14.9   Medium: 15-21.9   High: 22-29.9   Extreme: 30 and above ? ?  ? ?  ? ? ?Discharge Instructions: ? ?Allergies as of 11/08/2021   ?No Known Allergies ?  ? ?  ?Medication List  ?  ? ?STOP taking these medications   ? ?famotidine 20 MG tablet ?Commonly known as: PEPCID ?  ?multivitamin with minerals Tabs tablet ?  ?simethicone 80 MG chewable tablet ?Commonly known as: MYLICON ?  ? ?  ? ?TAKE these medications   ? ?aspirin EC 81 MG tablet ?Take 81 mg by mouth daily. Swallow whole. ?  ?atorvastatin 80 MG tablet ?Commonly known as: LIPITOR ?Take 80 mg by mouth daily. ?  ?carvedilol 6.25 MG tablet ?Commonly known as: COREG ?Take 1 tablet (6.25 mg total) by mouth 2 (two) times daily with a meal. ?  ?cefadroxil 500 MG capsule ?Commonly known as: DURICEF ?Take 500 mg by mouth 2 (two) times daily. ?  ?clonazePAM 0.5 MG disintegrating tablet ?Commonly known as: KLONOPIN ?Take 0.25 mg by mouth 2 (two) times daily. ?  ?diclofenac Sodium 1 % Gel ?Commonly known as: VOLTAREN ?Apply 2 g topically 4 (four) times daily. ?  ?doxycycline 100 MG  capsule ?Commonly known as: VIBRAMYCIN ?Take 100 mg by mouth 2 (two) times daily. ?  ?feeding supplement (NEPRO CARB STEADY) Liqd ?Take 237 mLs by mouth 3 (three) times daily between meals. ?  ?Flomax 0.4 MG Caps capsule ?Generic drug: tamsulosin ?Take 0.4 mg by mouth daily. ?  ?furosemide 20 MG tablet ?Commonly known as: LASIX ?Take 20 mg by mouth daily. ?  ?levETIRAcetam 500 MG tablet ?Commonly known as: Keppra ?Take 2 tablets (1,000 mg total) by mouth 2 (two) times daily. Increased from 500 mg BID. ?What changed:  ?how much to take ?additional instructions ?  ?losartan 25 MG tablet ?Commonly known as: COZAAR ?Take 1 tablet (25 mg total) by mouth daily. ?Start taking on: November 09, 2021 ?  ?polyethylene glycol 17 g packet ?Commonly known as: MIRALAX / GLYCOLAX ?Take 17 g by mouth every other day. ?  ?potassium chloride 10 MEQ tablet ?Commonly known as: KLOR-CON ?Take 10 mEq by mouth daily. ?  ?traZODone 50 MG tablet ?Commonly known as: DESYREL ?Take 25 mg by mouth at bedtime as  needed. ?  ?zinc oxide 20 % ointment ?Apply 1 application. topically 2 (two) times daily as needed. ?  ?Zoloft 25 MG tablet ?Generic drug: sertraline ?Take 25 mg by mouth daily. ?  ? ?  ? ? ? Follow-up Information   ? ? Townsend Roger, MD Follow up.   ?Specialty: Internal Medicine ?Contact information: ?[address] ?[address] ?[place] [address] ?[phone] ? ? ?  ?  ? ? Newt Minion, MD Follow up.   ?Specialty: Orthopedic Surgery ?Contact information: ?[address] ?[place] [address] ?[phone] ? ? ?  ?  ? ? Vladimir Crofts, MD Follow up in 1 month(s).   ?Specialty: Neurology ?Contact information: ?[place] ?[facility] ?[place] [address] ?[phone] ? ? ?  ?  ? ?  ?  ? ?  ? ? ?No Known Allergies ? ? ?The results of significant diagnostics from this hospitalization (including imaging, microbiology, ancillary and laboratory) are listed below for reference.   ? ?Consultations: ? ? ?Procedures/Studies: ?DG Tibia/Fibula Left ? ?Result Date: 11/07/2021 ?CLINICAL DATA:  Leg wound EXAM: LEFT TIBIA AND FIBULA - 2 VIEW COMPARISON:  None. FINDINGS: No fracture or dislocation is seen. Status post ORIF of the proximal ti

## 2021-11-08 NOTE — Progress Notes (Signed)
ANTICOAGULATION CONSULT NOTE ? ?Pharmacy Consult for IV Heparin  ?Indication: ACS/NSTEMI  ? ?Patient Measurements: ?Height: 5\' 8"  (172.7 cm) ?Weight: 93.7 kg (206 lb 9.1 oz) ?IBW/kg (Calculated) : 68.4 ?Heparin Dosing Weight:  88 kg ? ?Labs: ?Recent Labs  ?  11/06/21 ?2305 11/07/21 ?0105 11/07/21 ?1024 11/07/21 ?1244 11/07/21 ?1801 11/08/21 ?[id number]  ?HGB 13.8  --   --   --   --   --   ?HCT 45.3  --   --   --   --   --   ?PLT 208  --   --   --   --   --   ?APTT 31  --   --   --   --   --   ?LABPROT 15.3*  --   --   --   --   --   ?INR 1.2  --   --   --   --   --   ?HEPARINUNFRC  --   --  0.94*  --  0.93* 0.65  ?CREATININE 1.63*  --   --   --   --   --   ?TROPONINIHS [id number]*  --  2,047*  --   --   ? ? ?Estimated Creatinine Clearance: 52.8 mL/min (A) (by C-G formula based on SCr of 1.63 mg/dL (H)). ? ?Medical History: ?Past Medical History:  ?Diagnosis Date  ? Diabetes mellitus type II, non insulin dependent ([place])   ? Essential hypertension Raquel Sarna like  ? h/o Proteus pneumonia ([place]) 02/17/2021  ? Heart failure ([place])   ? Unknown details.  By report no cardiac surgery  ? Severe hypoxic-ischemic encephalopathy   ? Venous stasis dermatitis of left lower extremity   ? ?Assessment: ?Patient is a 61 y/o M with medical history including T2DM, HTN, OSA, history of OOH V tach arrest in 05/22 with asymmetric anoxic brain injury and CVA with residual left hemiparesis and seizure disorder, HFrEF with recovered EF, CKD stage III who is admitted with breakthru seizures. Troponin also elevated. There is concern for ACS. Pharmacy consulted to initiate and monitor heparin infusion for ACS. ? ?Goal of Therapy:  ?Heparin level 0.3-0.7 units/ml ?Monitor platelets by anticoagulation protocol: Yes ? ?3/21 0047 HL 0.65, therapeutic x 1 ?  ?Plan:  ?--Continue heparin infusion at 900 units/hr ?--Recheck HL 6 hours to confirm ?--Daily CBC per protocol while on IV heparin ? ?Renda Rolls, PharmD, MBA ?11/08/2021 ?2:04 AM ? ? ? ?

## 2021-11-08 NOTE — Progress Notes (Signed)
PHARMACY - PHYSICIAN COMMUNICATION ?CRITICAL VALUE ALERT - BLOOD CULTURE IDENTIFICATION (BCID) ? ?BCID result:  1 (anaerobic) of 4 with Staph Species, no resistance detected.  Pt given 1 time initial doses, but is not currently ordered any ongoing abx.  Vancomycin is recommended but WBC was WNL at admission and pt remains relatively afebrile (max temp 99.2 in past 24 hr).  Pt has CBC ordered with AM labs.  ? ?Name of provider contacted: Docia Furl, NP ? ?Changes to prescribed antibiotics required: None at this time.  Contaminant suspected, will continue to monitor. ? ?Otelia Sergeant, PharmD, MBA ?11/08/2021 ?3:30 AM ? ? ?

## 2021-11-08 NOTE — Progress Notes (Signed)
? ?Progress Note ? ?Patient Name: Jeremy Browning ?Date of Encounter: 11/08/2021 ? ?[facility] Cardiologist: Kate Sable, MD ? ?Subjective  ? ?No chest pain or shortness of breath.  Patient complains of some pain in the left leg at site of osteomyelitis.  No further seizures. ? ?Inpatient Medications  ?  ?Scheduled Meds: ? carvedilol  6.25 mg Oral BID WC  ? Chlorhexidine Gluconate Cloth  6 each Topical Daily  ? clonazepam  0.25 mg Oral BID  ? furosemide  20 mg Oral Daily  ? sertraline  25 mg Oral Daily  ? tamsulosin  0.4 mg Oral Daily  ? ?Continuous Infusions: ? levETIRAcetam Stopped (11/07/21 2155)  ? ?PRN Meds: ?acetaminophen, docusate sodium, lip balm, ondansetron (ZOFRAN) IV, ondansetron, polyethylene glycol  ? ?Vital Signs  ?  ?Vitals:  ? 11/07/21 2333 11/08/21 0000 11/08/21 0356 11/08/21 0400  ?BP:  (!) 143/62  (!) 137/56  ?Pulse:  (!) 58  64  ?Resp:  19  18  ?Temp: 98.5 ?F (36.9 ?C)  98.1 ?F (36.7 ?C)   ?TempSrc: Oral  Oral   ?SpO2:  100%  100%  ?Weight:      ?Height:      ? ? ?Intake/Output Summary (Last 24 hours) at 11/08/2021 0932 ?Last data filed at 11/08/2021 0700 ?Gross per 24 hour  ?Intake 1546.12 ml  ?Output 725 ml  ?Net 821.12 ml  ? ?Last 3 Weights 11/07/2021 11/07/2021 09/16/2021  ?Weight (lbs) 206 lb 9.1 oz 250 lb 217 lb  ?Weight (kg) 93.7 kg 113.399 kg 98.431 kg  ?   ? ?Telemetry  ?  ?Sinus rhythm with PVCs, heart rates 45-100 bpm. - Personally Reviewed ? ?ECG  ?  ?Normal sinus rhythm with left bundle branch block (unchanged from 11/06/2021 but new compared to 06/06/2021)- Personally Reviewed ? ?Physical Exam  ? ?GEN: No acute distress.   ?Neck: No JVD ?Cardiac: RRR, no murmurs, rubs, or gallops.  ?Respiratory: Clear to auscultation bilaterally. ?GI: Soft, nontender, non-distended  ?MS: Trace pretibial edema bilaterally; No deformity. ?Neuro:  Nonfocal  ?Psych: Normal affect  ? ?Labs  ?  ?High Sensitivity Troponin:   ?Recent Labs  ?Lab 11/06/21 ?2305 11/07/21 ?0105 11/07/21 ?1244  ?TROPONINIHS  466* 3,163* 2,047*  ?   ?Chemistry ?Recent Labs  ?Lab 11/06/21 ?2305 11/08/21 ?0352  ?NA 140 137  ?K 4.7 3.9  ?CL 106 107  ?CO2 21* 23  ?GLUCOSE 171* 111*  ?BUN 15 18  ?CREATININE 1.63* 1.49*  ?CALCIUM 8.7* 8.5*  ?MG 2.2 2.0  ?PROT 7.5  --   ?ALBUMIN 3.4*  --   ?AST 41  --   ?ALT 18  --   ?ALKPHOS 105  --   ?BILITOT 1.4*  --   ?GFRNONAA 48* 53*  ?ANIONGAP 13 7  ?  ?Lipids No results for input(s): CHOL, TRIG, HDL, LABVLDL, LDLCALC, CHOLHDL in the last 168 hours.  ?Hematology ?Recent Labs  ?Lab 11/06/21 ?2305 11/08/21 ?0352  ?WBC 7.4 7.8  ?RBC 5.66 5.13  ?HGB 13.8 12.6*  ?HCT 45.3 40.1  ?MCV 80.0 78.2*  ?MCH 24.4* 24.6*  ?MCHC 30.5 31.4  ?RDW 15.0 15.1  ?PLT 208 184  ? ?Thyroid  ?Recent Labs  ?Lab 11/06/21 ?2305  ?TSH 4.654*  ?  ?BNPNo results for input(s): BNP, PROBNP in the last 168 hours.  ?DDimer No results for input(s): DDIMER in the last 168 hours.  ? ?Radiology  ?  ?DG Tibia/Fibula Left ? ?Result Date: 11/07/2021 ?CLINICAL DATA:  Leg wound EXAM: LEFT TIBIA  AND FIBULA - 2 VIEW COMPARISON:  None. FINDINGS: No fracture or dislocation is seen. Status post ORIF of the proximal tibia. Tibiotalar joint is preserved. Visualized soft tissues are within normal limits. IMPRESSION: Negative. Electronically Signed   By: Julian Hy M.D.   On: 11/07/2021 00:27  ? ?CT Head Wo Contrast ? ?Result Date: 11/06/2021 ?CLINICAL DATA:  Encephalopathy EXAM: CT HEAD WITHOUT CONTRAST TECHNIQUE: Contiguous axial images were obtained from the base of the skull through the vertex without intravenous contrast. RADIATION DOSE REDUCTION: This exam was performed according to the departmental dose-optimization program which includes automated exposure control, adjustment of the mA and/or kV according to patient size and/or use of iterative reconstruction technique. COMPARISON:  None. FINDINGS: Brain: Progressive encephalomalacia within the right MCA territory, at the site of old infarct. Mild ex vacuo dilatation of the right lateral  ventricle. Old left occipital infarct. No hemorrhage or other acute abnormality. Vascular: No abnormal hyperdensity of the major intracranial arteries or dural venous sinuses. No intracranial atherosclerosis. Skull: The visualized skull base, calvarium and extracranial soft tissues are normal. Sinuses/Orbits: No fluid levels or advanced mucosal thickening of the visualized paranasal sinuses. No mastoid or middle ear effusion. The orbits are normal. IMPRESSION: 1. No acute intracranial abnormality. 2. Progressive encephalomalacia within the right MCA territory, at the site of old infarct. Electronically Signed   By: Ulyses Jarred M.D.   On: 11/06/2021 23:47  ? ?DG Chest Port 1 View ? ?Result Date: 11/06/2021 ?CLINICAL DATA:  Questionable sepsis - evaluate for abnormality EXAM: PORTABLE CHEST 1 VIEW COMPARISON:  03/31/2021 FINDINGS: Lung volumes are low. Stable cardiomegaly. Aortic atherosclerosis. Unchanged mediastinal contours. Hypoventilatory changes in the lung bases without confluent airspace disease no pleural effusion. No pneumothorax. No acute osseous findings. IMPRESSION: Low lung volumes with bibasilar atelectasis. Electronically Signed   By: Keith Rake M.D.   On: 11/06/2021 23:27  ? ?DG Knee Complete 4 Views Left ? ?Result Date: 11/07/2021 ?CLINICAL DATA:  Wound EXAM: LEFT KNEE - COMPLETE 4+ VIEW COMPARISON:  None. FINDINGS: Lateral compression plate and screw fixation of the proximal tibia, without evidence of hardware complication. No fracture or dislocation is seen. Mild degenerative changes, most prominent in the medial compartment. Visualized soft tissues are within normal limits. No suprapatellar knee joint effusion. IMPRESSION: Postsurgical and mild degenerative changes, as above. Electronically Signed   By: Julian Hy M.D.   On: 11/07/2021 00:27  ? ?EEG adult ? ?Result Date: 11/07/2021 ?Lora Havens, MD     11/07/2021  4:34 PM Patient Name: Jeremy Browning MRN: [id number] Epilepsy  Attending: Lora Havens Referring Physician/Provider: Donnetta Simpers, MD Date:11/07/2021 Duration: 21.33 mins Patient history: 61 year old with an extensive past medical history that includes cardiac arrest status post anoxic brain injury as well as right MCA stroke with residual left hemiparesis, ensuing seizures on antiepileptics in a skilled nursing facility, prior history of V. tach arrest with reduced ejection fraction which is now improved, CKD 3, brought in for 2 witnessed seizures.  EEG to evaluate for seizure Level of alertness: Awake, asleep AEDs during EEG study: LEV Technical aspects: This EEG study was done with scalp electrodes positioned according to the 10-20 International system of electrode placement. Electrical activity was acquired at a sampling rate of 500Hz  and reviewed with a high frequency filter of 70Hz  and a low frequency filter of 1Hz . EEG data were recorded continuously and digitally stored. Description: The posterior dominant rhythm consists of 8Hz  activity of moderate voltage (25-35 uV) seen predominantly  in posterior head regions, asymmetric ( right<left) and reactive to eye opening and eye closing. Sleep was characterized by vertex waves, sleep spindles (12 to 14 Hz), maximal frontocentral region. EEG showed continuous 3 to 6 Hz theta-delta slowing in right hemisphere as well as intermittent generalized 3-5hz  theta-delta slowing. Hyperventilation and photic stimulation were not performed.   ABNORMALITY - Continuous slow, right hemisphere - Intermittent slow, generalized - Background asymmetry, right<left IMPRESSION: This study is suggestive of cortical dysfunction arising from right hemisphere likely secondary to underlying structural abnormality. Additionally, there is Mild diffuse encephalopathy, nonspecific etiology. No seizures or epileptiform discharges were seen throughout the recording. Priyanka Barbra Sarks  ? ?ECHOCARDIOGRAM COMPLETE ? ?Result Date: 11/07/2021 ?    ECHOCARDIOGRAM REPORT   Patient Name:   Jeremy Browning Date of Exam: 11/07/2021 Medical Rec #:  [id number]      Height:       68.0 in Accession #:    [id number]     Weight:       206.6 lb Date of Birth:  10-20-1960       BSA:

## 2021-11-08 NOTE — Progress Notes (Signed)
Neurology Progress Note ? ? ?S:// ?Seen and examined ?No seizures overnight ? ? ? ?O:// ?Current vital signs: ?BP (!) 137/56 (BP Location: Right Arm)   Pulse 64   Temp 98.1 ?F (36.7 ?C) (Oral)   Resp 18   Ht 5\' 8"  (1.727 m)   Wt 93.7 kg   SpO2 100%   BMI 31.41 kg/m?  ?Vital signs in last 24 hours: ?Temp:  [98.1 ?F (36.7 ?C)-99.1 ?F (37.3 ?C)] 98.1 ?F (36.7 ?C) (03/21 0356) ?Pulse Rate:  [58-87] 64 (03/21 0400) ?Resp:  [5-30] 18 (03/21 0400) ?BP: (122-158)/(48-91) 137/56 (03/21 0400) ?SpO2:  [95 %-100 %] 100 % (03/21 0400) ?General: Awake, follows commands. ?HEENT: Normocephalic atraumatic ?Lungs: Clear ?Cardiovascular: Regular rate rhythm ?Abdomen nondistended nontender ?Extremities: Left upper extremity contractures, IO in place in the right leg has since been removed. ?Neurological exam ?Awake alert, he is able to tell me his name, yesterday was able to tell me in the hospital but today was not able to tell me he is in the hospital and that the month is March.  He was not able to tell me his correct age or the current year. ?He is able to follow commands. ?He is able to repeat although has poor attention concentration ?Speech is mildly dysarthric ?Cranial nerves: Pupils are equal, visual fields full, left lower facial weakness seen, extraocular movements intact, tongue and palate midline. ?Motor examination with significant increased tone in the left upper extremity and barely antigravity of the left shoulder with more distal weakness barely 1/5 in handgrip.  Right upper extremity is full strength of right leg unable to lift antigravity.  Left leg better movement to noxious stimulation. ?Sensation: Diminished on the left side and has extinction ?Coordination with no obvious dysmetria ? ?Medications ? ?Current Facility-Administered Medications:  ?  acetaminophen (TYLENOL) tablet 1,000 mg, 1,000 mg, Oral, TID PRN, April, MD, 1,000 mg at 11/07/21 2337 ?  carvedilol (COREG) tablet 6.25 mg, 6.25 mg, Oral,  BID WC, 2338, MD, 6.25 mg at 11/07/21 1841 ?  Chlorhexidine Gluconate Cloth 2 % PADS 6 each, 6 each, Topical, Daily, 11/09/21, MD, 6 each at 11/07/21 2124 ?  docusate sodium (COLACE) capsule 100 mg, 100 mg, Oral, BID PRN, 2125, MD ?  furosemide (LASIX) tablet 20 mg, 20 mg, Oral, Daily, Darlin Priestly, MD ?  levETIRAcetam (KEPPRA) IVPB 1000 mg/100 mL premix, 1,000 mg, Intravenous, Q12H, Darlin Priestly, MD, Stopped at 11/07/21 2155 ?  lip balm (BLISTEX) ointment, , Topical, PRN, 2156, MD, Given at 11/07/21 2154 ?  ondansetron (ZOFRAN) injection 4 mg, 4 mg, Intravenous, Q6H PRN, 2155, MD ?  ondansetron (ZOFRAN-ODT) disintegrating tablet 4 mg, 4 mg, Oral, Q8H PRN, Darlin Priestly, MD ?  polyethylene glycol (MIRALAX / GLYCOLAX) packet 17 g, 17 g, Oral, BID PRN, Darlin Priestly, MD ?  sertraline (ZOLOFT) tablet 25 mg, 25 mg, Oral, Daily, Darlin Priestly, MD ?  tamsulosin [facility]) capsule 0.4 mg, 0.4 mg, Oral, Daily, [facility], MD, 0.4 mg at 11/07/21 1841 ?Labs ?CBC ?   ?Component Value Date/Time  ? WBC 7.8 11/08/2021 0352  ? RBC 5.13 11/08/2021 0352  ? HGB 12.6 (L) 11/08/2021 0352  ? HCT 40.1 11/08/2021 0352  ? PLT 184 11/08/2021 0352  ? MCV 78.2 (L) 11/08/2021 0352  ? MCH 24.6 (L) 11/08/2021 0352  ? MCHC 31.4 11/08/2021 0352  ? RDW 15.1 11/08/2021 0352  ? LYMPHSABS 1.7 11/06/2021 2305  ? MONOABS 0.4 11/06/2021 2305  ? EOSABS  0.4 11/06/2021 2305  ? BASOSABS 0.0 11/06/2021 2305  ? ? ?CMP  ?   ?Component Value Date/Time  ? NA 137 11/08/2021 0352  ? K 3.9 11/08/2021 0352  ? CL 107 11/08/2021 0352  ? CO2 23 11/08/2021 0352  ? GLUCOSE 111 (H) 11/08/2021 0352  ? BUN 18 11/08/2021 0352  ? CREATININE 1.49 (H) 11/08/2021 0352  ? CREATININE 1.39 (H) 08/11/2021 1203  ? CALCIUM 8.5 (L) 11/08/2021 0352  ? PROT 7.5 11/06/2021 2305  ? ALBUMIN 3.4 (L) 11/06/2021 2305  ? AST 41 11/06/2021 2305  ? ALT 18 11/06/2021 2305  ? ALKPHOS 105 11/06/2021 2305  ? BILITOT 1.4 (H) 11/06/2021 2305  ? GFRNONAA 53 (L) 11/08/2021 0352  ? ? ?11/07/2021 Spot  EEG with continuous low right hemisphere, intermittent slow that is generalized and background asymmetry with right less than left.-Study suggestive of cortical dysfunction arising from the right hemisphere likely due to underlying structural abnormality.  Additionally, mild diffuse encephalopathy nonspecific in etiology.  No seizures or epileptiform discharges. ? ?Imaging ?I have reviewed images in epic and the results pertinent to this consultation are: ?No new imaging ? ?Assessment:  ?61 year old extensive past medical history that includes cardiac arrest status post anoxic brain injury as well as right MCA stroke and ensuing seizures in addition to multiple other comorbidities brought in for breakthrough seizures. ?Keppra dose was increased. ?Chart review reveals home dose of Klonopin 0.25 twice daily which was not resumed in the hospital. ?I would recommend resuming the home benzo back. ?Cardiology seen the patient-no further cardiac work-up for the troponinemia ? 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 ?Recommendations: ?Maintain seizure precautions ?Keppra 1000 twice daily ?Klonopin 0.25 twice daily ?Follow-up with outpatient neurology in 4 to 6 weeks ? ?Plan d/w Dr Fran Lowes ? ?-- ?Milon Dikes, MD ?Neurologist ?[facility] ?Pager: [phone] ?

## 2021-11-08 NOTE — TOC Transition Note (Addendum)
Transition of Care (TOC) - CM/SW Discharge Note ? ? ?Patient Details  ?Name: Jeremy Browning ?MRN: [id number] ?Date of Birth: 18-Aug-1961 ? ?Transition of Care (TOC) CM/SW Contact:  ?Morehouse Cellar, RN ?Phone Number: ?11/08/2021, 1:54 PM ? ? ?Clinical Narrative:    ?Confirmed patient returning to [facility] under LTC. Confirmed follow up appointments made. RN scheduling EMS transport. No other needs. Confirmed with Tonya DC Summary received at facility.  ? ? ?  ?  ? ? ?Patient Goals and CMS Choice ?  ?  ?  ? ?Discharge Placement ?  ?           ?  ?  ?  ?  ? ?Discharge Plan and Services ?  ?  ?           ?  ?  ?  ?  ?  ?  ?  ?  ?  ?  ? ?Social Determinants of Health (SDOH) Interventions ?  ? ? ?Readmission Risk Interventions ?No flowsheet data found. ? ? ? ? ?

## 2021-11-08 NOTE — TOC Initial Note (Signed)
Transition of Care (TOC) - Initial/Assessment Note  ? ? ?Patient Details  ?Name: Jeremy Browning ?MRN: [id number] ?Date of Birth: 1961/05/26 ? ?Transition of Care (TOC) CM/SW Contact:    ?Anselm Pancoast, RN ?Phone Number: ?11/08/2021, 11:58 AM ? ?Clinical Narrative:                 ?Spoke to [employer] [facility] and confirmed patient is clear to return to [facility] at discharge. Patient will return under Medicaid LTC benefits.  ? ?  ?  ? ? ?Patient Goals and CMS Choice ?  ?  ?  ? ?Expected Discharge Plan and Services ?  ?  ?  ?  ?  ?                ?  ?  ?  ?  ?  ?  ?  ?  ?  ?  ? ?Prior Living Arrangements/Services ?  ?  ?  ?       ?  ?  ?  ?  ? ?Activities of Daily Living ?Home Assistive Devices/Equipment: CBG Meter ?ADL Screening (condition at time of admission) ?Patient's cognitive ability adequate to safely complete daily activities?: No ?Is the patient deaf or have difficulty hearing?: No ?Does the patient have difficulty seeing, even when wearing glasses/contacts?: No ?Does the patient have difficulty concentrating, remembering, or making decisions?: No ?Patient able to express need for assistance with ADLs?: Yes ?Does the patient have difficulty dressing or bathing?: Yes ?Independently performs ADLs?: No ?Communication: Independent ?Dressing (OT): Dependent ?Is this a change from baseline?: Pre-admission baseline ?Grooming: Dependent ?Is this a change from baseline?: Pre-admission baseline ?Feeding: Independent ?Bathing: Dependent ?Is this a change from baseline?: Pre-admission baseline ?Toileting: Dependent ?Is this a change from baseline?: Pre-admission baseline ?In/Out Bed: Dependent ?Is this a change from baseline?: Pre-admission baseline ?Walks in Home: Dependent ?Is this a change from baseline?: Pre-admission baseline ?Does the patient have difficulty walking or climbing stairs?: Yes ?Weakness of Legs: Left ?Weakness of Arms/Hands: Left ? ?Permission Sought/Granted ?  ?  ?   ?   ?   ?    ? ?Emotional Assessment ?  ?  ?  ?  ?  ?  ? ?Admission diagnosis:  Lactic acidosis [E87.20] ?Seizure ([place]) [R56.9] ?NSTEMI (non-ST elevated myocardial infarction) ([place]) [I21.4] ?SIRS (systemic inflammatory response syndrome) (HCC) [R65.10] ?Breakthrough seizure ([place]) [G40.919] ?Patient Active Problem List  ? Diagnosis Date Noted  ? Acute on chronic HFrEF (heart failure with reduced ejection fraction) ([place])   ? Breakthrough seizure ([place]) 11/07/2021  ? NSTEMI (non-ST elevated myocardial infarction) ([place]) 11/07/2021  ? Stage 3a chronic kidney disease ([place]) 11/07/2021  ? Seizure disorder as sequela of cerebrovascular accident ([place]) 11/07/2021  ? Demand ischemia ([place])   ? Status post open reduction and internal fixation (ORIF) of fracture 09/07/2021  ? Medication monitoring encounter 08/11/2021  ? Draining cutaneous sinus tract 08/11/2021  ? Chronic osteomyelitis ([place]) 08/11/2021  ? Morbid obesity ([place]) 05/14/2021  ? OSA (obstructive sleep apnea) 05/14/2021  ? Complete left bundle branch block (LBBB) 05/14/2021  ? Chronic venous insufficiency -Left Leg > Right. 05/14/2021  ? Right knee DJD 02/16/2021  ? Essential hypertension 02/16/2021  ? Pressure injury of skin 02/10/2021  ? Oropharyngeal dysphagia   ? HFrEF (heart failure with reduced ejection fraction) ([place])   ? h/o Cardiopulmonary arrest with successful resuscitation ([place]) 12/25/2020  ? H/O Acute respiratory failure with hypoxia and hypercapnia ([place]) 12/25/2020  ? Acute renal failure (  [place]) 12/25/2020  ? Wenckebach second degree AV block 12/25/2020  ? Lactic acidosis 12/25/2020  ? Delayed surgical wound healing 06/23/2020  ? Skin ulcer of knee, left, limited to breakdown of skin ([place]) 06/23/2020  ? BMI 40.0-44.9, adult ([place]) 12/12/2019  ? Left ventricular dyssynchrony 09/14/2018  ? Hyperlipidemia 09/13/2018  ? Tobacco abuse 02/07/2017  ? Controlled type 2 diabetes mellitus without complication, without long-term current use of insulin ([place]) 06/15/2016  ?  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 Non-ischemic cardiomyopathy ([place]) 2017  ? Edema of both legs 10/15/2013  ? Cerebral infarction ([place]) 09/26/2013  ? COPD (chronic obstructive pulmonary disease) ([place]) 09/26/2013  ? Closed fracture of lateral portion of left tibial plateau 09/24/2013  ? ?PCP:  Townsend Roger, MD ?Pharmacy:   ?[id number] - [place], [place] ?[place] [address] ?Phone: [phone] Fax: [phone] ? ? ? ? ?Social Determinants of Health (SDOH) Interventions ?  ? ?Readmission Risk Interventions ?No flowsheet data found. ? ? ?

## 2021-11-08 NOTE — Progress Notes (Signed)
ANTICOAGULATION CONSULT NOTE ? ?Pharmacy Consult for IV Heparin  ?Indication: ACS/NSTEMI  ? ?Patient Measurements: ?Height: 5\' 8"  (172.7 cm) ?Weight: 93.7 kg (206 lb 9.1 oz) ?IBW/kg (Calculated) : 68.4 ?Heparin Dosing Weight:  88 kg ? ?Labs: ?Recent Labs  ?  11/06/21 ?2305 11/07/21 ?0105 11/07/21 ?1024 11/07/21 ?1244 11/07/21 ?1801 11/08/21 ?[id number] 11/08/21 ?[id number] 11/08/21 ?[id number]  ?HGB 13.8  --   --   --   --   --  12.6*  --   ?HCT 45.3  --   --   --   --   --  40.1  --   ?PLT 208  --   --   --   --   --  184  --   ?APTT 31  --   --   --   --   --   --   --   ?LABPROT 15.3*  --   --   --   --   --   --   --   ?INR 1.2  --   --   --   --   --   --   --   ?HEPARINUNFRC  --   --    < >  --  0.93* 0.65  --  0.60  ?CREATININE 1.63*  --   --   --   --   --  1.49*  --   ?TROPONINIHS 466* 3,163*  --  2,047*  --   --   --   --   ? < > = values in this interval not displayed.  ? ? ?Estimated Creatinine Clearance: 57.8 mL/min (A) (by C-G formula based on SCr of 1.49 mg/dL (H)). ? ?Medical History: ?Past Medical History:  ?Diagnosis Date  ? Diabetes mellitus type II, non insulin dependent ([place])   ? Essential hypertension Raquel Sarna like  ? h/o Proteus pneumonia ([place]) 02/17/2021  ? Heart failure ([place])   ? Unknown details.  By report no cardiac surgery  ? Severe hypoxic-ischemic encephalopathy   ? Venous stasis dermatitis of left lower extremity   ? ?Assessment: ?Patient is a 61 y/o M with medical history including T2DM, HTN, OSA, history of OOH V tach arrest in 05/22 with asymmetric anoxic brain injury and CVA with residual left hemiparesis and seizure disorder, HFrEF with recovered EF, CKD stage III who is admitted with breakthru seizures. Troponin also elevated. There is concern for ACS. Pharmacy consulted to initiate and monitor heparin infusion for ACS. ? ?Goal of Therapy:  ?Heparin level 0.3-0.7 units/ml ?Monitor platelets by anticoagulation protocol: Yes ? ? ?Plan:  ?--Heparin level is therapeutic x 2 ?--Continue heparin infusion at  900 units/hr ?--HL tomorrow AM ?--Daily CBC per protocol while on IV heparin ? ?Benita Gutter  ?11/08/2021 ?7:42 AM ? ? ? ?

## 2021-11-09 ENCOUNTER — Telehealth: Payer: Self-pay | Admitting: Orthopedic Surgery

## 2021-11-09 NOTE — Progress Notes (Signed)
Spoke with Leavy Cella, LPN from [facility] [phone], and she confirms the pt is a resident there. She states the pt is AAOx4, and confirms that the pt has not had to be tested for covid or flu in the last 2 weeks. Pre-op instructions faxed [phone] to the facility. Awaiting confirmation of receipt. ?

## 2021-11-09 NOTE — Telephone Encounter (Signed)
Jeremy Browning from [facility] called. She is requesting a call back concerning pt's upcoming surgery on 3/24. She states pt has an infection and is waiting for Dr. Sharol Given to confirm if he will still preform surgery. Unsure if surgery needs to be canceled. Looks like pt's surgery is for the infection. Please call Magda Paganini at [phone]. ?

## 2021-11-09 NOTE — Telephone Encounter (Signed)
I see on ED discharge notes that the hospitalist spoke with you about the upcoming surgery and this was going to be postponed as this has been an ongoing occurrence and he will take abx and f/u in clinic here. I see that he is still on Sx schedule for 11/11/21. Should this be canceled? ?

## 2021-11-09 NOTE — Pre-Procedure Instructions (Addendum)
? ? ? Jeremy Browning ? 11/09/2021  ?  ? Surgical Instructions ? ? Your procedure is scheduled on Fri., November 11, 2021 from 10:29AM-11:17AM. ? Report to [facility] Main Entrance "A" at 8:00 A.M., then check in with the Admitting office. ? Call this number if you have problems the morning of surgery: ? [phone] ? ? Remember: ? Do not eat after midnight on March 23rd ? ?You may drink clear liquids until 3 hours (7:30AM) prior to surgery time the morning of your surgery.   ?Clear liquids allowed are: Water, Non-Citrus Juices (without pulp), Carbonated Beverages, Clear Tea, Black Coffee ONLY (NO MILK, CREAM OR POWDERED CREAMER of any kind), and Gatorade ?  ? Take these medicines the morning of surgery with A SIP OF WATER: ?Please provide the exact times the medications were given prior to arrival.  ?Carvedilol (COREG) ?ClonazePAM (KLONOPIN) ?FLOMAX ?LevETIRAcetam (KEPPRA) ?ZOLOFT ? ?As of today, STOP taking any Aspirin (unless otherwise instructed by your surgeon) Aleve, Naproxen, Ibuprofen, Motrin, Advil, Goody's, BC's, all herbal medications, fish oil, and all vitamins. ? ?How to Manage Your Diabetes ?Before and After Surgery ? ?How do I manage my blood sugar before surgery? ?Check your blood sugar the morning of your surgery when you wake up and every 2 hours until you get to the Short Stay unit. ?If your blood sugar is less than 70 mg/dL, you will need to treat for low blood sugar: ?Do not take insulin. ?Treat a low blood sugar (less than 70 mg/dL) with ? cup of clear juice (cranberry or apple), 4 glucose tablets, OR glucose gel. ?Recheck blood sugar in 15 minutes after treatment (to make sure it is greater than 70 mg/dL). If your blood sugar is not greater than 70 mg/dL on recheck, call [phone] ? for further instructions. ?Report your blood sugar to the short stay nurse when you get to Short Stay. ? ?If your CBG is greater than 220 mg/dL, inform the staff upon arrival to Short Stay. ? ?Reviewed and Endorsed  by [facility] Patient Education Committee, August 2015 ? ?           Day of Surgery: ?Do not wear jewelry or makeup ?Do not wear lotions, powders, perfumes/colognes, or deodorant. ?Do not shave 48 hours prior to surgery.  Men may shave face and neck. ?Do not bring valuables to the hospital. ? ?           [facility] is not responsible for any belongings or valuables. ? ?Do NOT Smoke (Tobacco/Vaping)  24 hours prior to your procedure ? ?If you use a CPAP at night, you may bring your mask and machine for your overnight stay. ?  ?Contacts, glasses, hearing aids, dentures or partials may not be worn into surgery, please bring cases for these belongings ?  ?For patients admitted to the hospital, discharge time will be determined by your treatment team. ?  ?Patients discharged the day of surgery will not be allowed to drive home, and someone needs to stay with them for 24 hours. ? ?NO VISITORS WILL BE ALLOWED IN PRE-OP WHERE PATIENTS ARE PREPPED FOR SURGERY.   ? ?SURGICAL WAITING ROOM VISITATION ?Patients having surgery or a procedure in a hospital may have two support people. ?Children under the age of 73 must have an adult with them who is not the patient. ?They may stay in the waiting area during the procedure and may switch out with other visitors. If the patient needs to stay at the hospital during  part of their recovery, the visitor guidelines for inpatient rooms apply. ? ?Please refer to the [facility] website for the visitor guidelines for Inpatients (after your surgery is over and you are in a regular room).  ? ?Special instructions:   ? ?Oral Hygiene is also important to reduce your risk of infection.  Remember - BRUSH YOUR TEETH THE MORNING OF SURGERY WITH YOUR REGULAR TOOTHPASTE ? ? ?[facility]- Preparing For Surgery ? ?Before surgery, you can play an important role. Because skin is not sterile, your skin needs to be as free of germs as possible. You can reduce the number of germs on your skin by washing  with an Antibacterial or Regular Soap before surgery.   ? ?Please follow these instructions carefully. ?  ? Shower the NIGHT BEFORE SURGERY or the MORNING OF SURGERY with an Antibacterial or Regular Soap.  ?  ?Pat yourself dry with a CLEAN TOWEL. ? ?Wear CLEAN PAJAMAS to bed the night before surgery ? ?Place CLEAN SHEETS on your bed the night before your surgery ? ?DO NOT SLEEP WITH PETS. ? ?Reminder: ?Wear Clean/Comfortable clothing the morning of surgery ?Do not apply any deodorants/lotions.   ?Remember to brush your teeth WITH YOUR REGULAR TOOTHPASTE. ? ?If you received a COVID test during your pre-op visit  it is requested that you wear a mask when out in public, stay away from anyone that may not be feeling well and notify your surgeon if you develop symptoms. If you have been in contact with anyone that has tested positive in the last 10 days please notify you surgeon. ? ?Notify your provider: ? if you develop a fever of 100.4 or greater, sneezing, cough, sore throat, shortness of breath or body aches. ? ?Please read over the following fact sheets that you were given.   ?  ? ? ?  ?

## 2021-11-10 LAB — CULTURE, BLOOD (ROUTINE X 2): Special Requests: ADEQUATE

## 2021-11-10 LAB — CSF CULTURE W GRAM STAIN: Culture: NO GROWTH

## 2021-11-10 NOTE — Telephone Encounter (Signed)
Per Dr. Lajoyce Corners, yes postpone surgery for later time since pt has recently had seizures, would like to wait for safer time. He is on oral abx. This appt was cancelled earlier today. ?

## 2021-11-11 ENCOUNTER — Encounter (HOSPITAL_COMMUNITY): Admission: RE | Payer: Self-pay | Source: Home / Self Care

## 2021-11-11 ENCOUNTER — Ambulatory Visit (HOSPITAL_COMMUNITY): Admission: RE | Admit: 2021-11-11 | Payer: Medicaid Other | Source: Home / Self Care | Admitting: Orthopedic Surgery

## 2021-11-11 LAB — CULTURE, BLOOD (ROUTINE X 2): Culture: NO GROWTH

## 2021-11-11 SURGERY — REMOVAL, HARDWARE
Anesthesia: Choice | Laterality: Left

## 2021-11-12 LAB — AEROBIC/ANAEROBIC CULTURE W GRAM STAIN (SURGICAL/DEEP WOUND)

## 2021-11-24 ENCOUNTER — Ambulatory Visit (INDEPENDENT_AMBULATORY_CARE_PROVIDER_SITE_OTHER): Payer: Medicaid Other | Admitting: Orthopedic Surgery

## 2021-11-24 DIAGNOSIS — M009 Pyogenic arthritis, unspecified: Secondary | ICD-10-CM | POA: Diagnosis not present

## 2021-11-24 DIAGNOSIS — M86462 Chronic osteomyelitis with draining sinus, left tibia and fibula: Secondary | ICD-10-CM

## 2021-11-24 DIAGNOSIS — T847XXS Infection and inflammatory reaction due to other internal orthopedic prosthetic devices, implants and grafts, sequela: Secondary | ICD-10-CM | POA: Diagnosis not present

## 2021-11-25 ENCOUNTER — Encounter: Payer: Self-pay | Admitting: Orthopedic Surgery

## 2021-11-25 NOTE — Progress Notes (Signed)
? ?Office Visit Note ?  ?Patient: Jeremy Browning           ?Date of Birth: Nov 07, 1960           ?MRN: [id number] ?Visit Date: 11/24/2021 ?             ?Requested by: Townsend Roger, MD ?[address] ?[place],  [place] [id number] ?PCP: Townsend Roger, MD ? ?Chief Complaint  ?Patient presents with  ? Left Leg - Follow-up  ? ? ? ? ?HPI: ?Patient is a 61 year old gentleman who is seen for evaluation for chronic osteomyelitis left proximal tibia with infected deep retained hardware status post tibial plateau fracture fixation at [facility] years ago.  Patient states he still has a persistent sinus draining tract. ? ?Patient states he has had no seizure activities since starting [place] patient is currently a resident at [facility]. ? ?Assessment & Plan: ?Visit Diagnoses:  ?1. Chronic infection of left knee ([place])   ?2. 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 Hardware complicating wound infection, sequela   ? ? ?Plan: We will schedule patient for removal of deep retained hardware removal of infected bone and placement of antibiotics cultures and will plan for antibiotics based on culture sensitivities.  Risks and benefits were discussed including persistent infection need for additional surgery. ? ?Follow-Up Instructions: Return in about 2 weeks (around 12/08/2021).  ? ?Ortho Exam ? ?Patient is alert, oriented, no adenopathy, well-dressed, normal affect, normal respiratory effort. ?Examination patient has a contractures left hand consistent with previous stroke.  The ulcer over the tibial plateau probes to hardware.  There is no surrounding cellulitis there is clear drainage. ? ?Imaging: ?No results found. ?No images are attached to the encounter. ? ?Labs: ?Lab Results  ?Component Value Date  ? HGBA1C 7.1 (H) 02/26/2021  ? HGBA1C 5.9 (H) 12/25/2020  ? ESRSEDRATE 23 (H) 11/07/2021  ? ESRSEDRATE 118 (H) 08/11/2021  ? ESRSEDRATE 116 (H) 02/03/2021  ? CRP 18.7 (H) 08/11/2021  ? CRP  30.4 (H) 06/14/2021  ? LABURIC 7.9 02/03/2021  ? REPTSTATUS 11/08/2021 FINAL 11/07/2021  ? REPTSTATUS 11/12/2021 FINAL 11/07/2021  ? GRAMSTAIN  11/07/2021  ?  FEW WBC PRESENT, PREDOMINANTLY PMN ?NO ORGANISMS SEEN ?  ? CULT MULTIPLE SPECIES PRESENT, SUGGEST RECOLLECTION (A) 11/07/2021  ? CULT  11/07/2021  ?  FEW STAPHYLOCOCCUS AUREUS ?NO ANAEROBES ISOLATED ?Performed at [facility], [place] [address] [place],  [address] ?  ? [place] STAPHYLOCOCCUS AUREUS 11/07/2021  ? ? ? ?Lab Results  ?Component Value Date  ? ALBUMIN 3.4 (L) 11/06/2021  ? ALBUMIN 3.1 (L) 04/08/2021  ? ALBUMIN 3.2 (L) 03/07/2021  ? ? ?Lab Results  ?Component Value Date  ? MG 2.0 11/08/2021  ? MG 2.2 11/06/2021  ? MG 2.3 03/07/2021  ? ?Lab Results  ?Component Value Date  ? VD25OH 25.76 (L) 02/12/2021  ? ? ?No results found for: PREALBUMIN ? ?  Latest Ref Rng & Units 11/08/2021  ?  3:52 AM 11/06/2021  ? 11:05 PM 08/11/2021  ? 12:03 PM  ?CBC EXTENDED  ?WBC 4.0 - 10.5 K/uL 7.8   7.4   6.9    ?RBC 4.22 - 5.81 MIL/uL 5.13   5.66   5.24    ?Hemoglobin 13.0 - 17.0 g/dL 12.6   13.8   12.7    ?HCT 39.0 - 52.0 % 40.1   45.3   40.8    ?Platelets 150 - 400  K/uL 184   208   223    ?NEUT# 1.7 - 7.7 K/uL  4.9     ?Lymph# 0.7 - 4.0 K/uL  1.7     ? ? ? ?There is no height or weight on file to calculate BMI. ? ?Orders:  ?No orders of the defined types were placed in this encounter. ? ?No orders of the defined types were placed in this encounter. ? ? ? Procedures: ?No procedures performed ? ?Clinical Data: ?No additional findings. ? ?ROS: ? ?All other systems negative, except as noted in the HPI. ?Review of Systems ? ?Objective: ?Vital Signs: There were no vitals taken for this visit. ? ?Specialty Comments:  ?No specialty comments available. ? ?PMFS History: ?Patient Active Problem List  ? Diagnosis Date Noted  ? Acute on chronic HFrEF (heart failure with reduced ejection fraction) ([place])   ? Breakthrough seizure ([place]) 11/07/2021  ? NSTEMI (non-ST elevated  myocardial infarction) ([place]) 11/07/2021  ? Stage 3a chronic kidney disease ([place]) 11/07/2021  ? Seizure disorder as sequela of cerebrovascular accident ([place]) 11/07/2021  ? Demand ischemia ([place])   ? Status post open reduction and internal fixation (ORIF) of fracture 09/07/2021  ? Medication monitoring encounter 08/11/2021  ? Draining cutaneous sinus tract 08/11/2021  ? Chronic osteomyelitis ([place]) 08/11/2021  ? Morbid obesity ([place]) 05/14/2021  ? OSA (obstructive sleep apnea) 05/14/2021  ? Complete left bundle branch block (LBBB) 05/14/2021  ? Chronic venous insufficiency -Left Leg > Right. 05/14/2021  ? Right knee DJD 02/16/2021  ? Essential hypertension 02/16/2021  ? Pressure injury of skin 02/10/2021  ? Oropharyngeal dysphagia   ? HFrEF (heart failure with reduced ejection fraction) ([place])   ? h/o Cardiopulmonary arrest with successful resuscitation ([place]) 12/25/2020  ? H/O Acute respiratory failure with hypoxia and hypercapnia ([place]) 12/25/2020  ? Acute renal failure ([place]) 12/25/2020  ? Wenckebach second degree AV block 12/25/2020  ? Lactic acidosis 12/25/2020  ? Delayed surgical wound healing 06/23/2020  ? Skin ulcer of knee, left, limited to breakdown of skin ([place]) 06/23/2020  ? BMI 40.0-44.9, adult ([place]) 12/12/2019  ? Left ventricular dyssynchrony 09/14/2018  ? Hyperlipidemia 09/13/2018  ? Tobacco abuse 02/07/2017  ? Controlled type 2 diabetes mellitus without complication, without long-term current use of insulin ([place]) 06/15/2016  ? 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 Non-ischemic cardiomyopathy ([place]) 2017  ? Edema of both legs 10/15/2013  ? Cerebral infarction ([place]) 09/26/2013  ? COPD (chronic obstructive pulmonary disease) ([place]) 09/26/2013  ? Closed fracture of lateral portion of left tibial plateau 09/24/2013  ? ?Past Medical History:  ?Diagnosis Date  ? Diabetes mellitus type II, non insulin dependent ([place])   ?  Essential hypertension Raquel Sarna like  ? h/o Proteus pneumonia ([place]) 02/17/2021  ? Heart failure ([place])   ? Unknown details.  By report no cardiac surgery  ? Severe hypoxic-ischemic encephalopathy   ? Venous stasis dermatitis of left lower extremity   ?  ?Family History  ?Problem Relation Age of Onset  ? Asthma Mother   ? Hypertension Mother   ?     LIkely Severe/Malignant  ? Kidney failure Mother   ?     ESRD - HD  ? CVA Mother   ? Alcohol abuse Father   ? Depression Father   ? Asthma Sister   ? Hypertension Sister   ? Diabetes Mellitus II Sister   ? Asthma Brother   ? Hypertension Brother   ?  ?  Past Surgical History:  ?Procedure Laterality Date  ? LEFT HEART CATH AND CORONARY ANGIOGRAPHY N/A 12/25/2020  ? Procedure: LEFT HEART CATH AND CORONARY ANGIOGRAPHY;  Surgeon: Leonie Man, MD;  Location: [facility];  Service: Cardiovascular;  Laterality: N/A; (In setting of Acute Hypoxic and Hypercapnic Respiratory Failure with Flash Pulm Edema/Hypertensive Emergency) - Angiographically minimal CAD.  Moderately reduced LV function with EF of 35 to 45%. LV P-EDP 101/21 mmHg - 30 mmHg  ? LEFT HEART CATH AND CORONARY ANGIOGRAPHY  09/12/2018  ? [facility]): Ostial LAD ~20% otherwise normal coronary arteries. LVEDP 20 mmHg;- HTN Emergency related Type II MI>  ? PEG PLACEMENT N/A 01/11/2021  ? Procedure: PERCUTANEOUS ENDOSCOPIC GASTROSTOMY (PEG) PLACEMENT;  Surgeon: Lucilla Lame, MD;  Location: [facility] ENDOSCOPY;  Service: Endoscopy;  Laterality: N/A;  ? RIGHT HEART CATH N/A 12/25/2020  ? Procedure: RIGHT HEART CATH;  Surgeon: Leonie Man, MD;  Location: [facility] CV LAB;  Service: Cardiovascular; R IJ: (Acute Hypoxic and Hypercapnic Respiratory Failure with Flash Pulm Edema/Hypertensive Emergency): CO-CI 6.54-2.82. Mild Secondary Pulmonary Hypertension (PCWP 30 mmHg.  PAP 47/13 mmHg with a mean PAP of 33 mmHg, RAP 22 mmHg in setting of AoP-MAP 97/59 mmHg - 71 mmHg,  ? TRACHEOSTOMY TUBE PLACEMENT N/A 01/12/2021  ?  Procedure: TRACHEOSTOMY;  Surgeon: Carloyn Manner, MD;  Location: [facility] ORS;  Service: ENT;  Laterality: N/A;  ? TRANSTHORACIC ECHOCARDIOGRAM  09/11/2018  ? [facility]: Moderately reduced LVEF ~35%. with mild LVH - WMA rel

## 2021-12-08 ENCOUNTER — Telehealth: Payer: Self-pay

## 2021-12-08 NOTE — Telephone Encounter (Signed)
Do you have a blue sheet for this pt? I see where he was scheduled and then had to cancel.  ?

## 2021-12-08 NOTE — Telephone Encounter (Signed)
Nursing facility called Triage Line. Wanted to make appt for patient  to see Dr. Sharol Given. Made Appt. Wanted to know when patient will have surgery. I advised I would send message to see if he needed to be seen sooner or just needs to R/S Surgery. Please advise. ? ? ? ? ? ? ?CB [phone] ?

## 2021-12-16 NOTE — Telephone Encounter (Signed)
Mr. Jeremy Browning has an appointment on Monday. Will schedule then while he is in the office with someone from the nursing staff.  I do have a surgery sheet.   ?

## 2021-12-19 ENCOUNTER — Ambulatory Visit (INDEPENDENT_AMBULATORY_CARE_PROVIDER_SITE_OTHER): Payer: Medicaid Other | Admitting: Orthopedic Surgery

## 2021-12-19 DIAGNOSIS — T847XXS Infection and inflammatory reaction due to other internal orthopedic prosthetic devices, implants and grafts, sequela: Secondary | ICD-10-CM | POA: Diagnosis not present

## 2021-12-19 DIAGNOSIS — M86462 Chronic osteomyelitis with draining sinus, left tibia and fibula: Secondary | ICD-10-CM

## 2021-12-20 ENCOUNTER — Telehealth: Payer: Self-pay | Admitting: Orthopedic Surgery

## 2021-12-20 ENCOUNTER — Telehealth: Payer: Self-pay

## 2021-12-20 NOTE — Telephone Encounter (Signed)
Nurse states that mutual patient was seen by Dr. Lajoyce Corners on 12/19/2021 and wrote on papers that patient needs topical antibiotics and IV antibiotics but didn't list the medications. She would like a call back if possible.  ?

## 2021-12-20 NOTE — Telephone Encounter (Signed)
Duplicate message, Jeremy Browning has already been contacted. ?

## 2021-12-20 NOTE — Telephone Encounter (Signed)
Received call from Cynthie @ [facility] where patient resides. Needs to know any orders from yesterday's ov with Dr. Lajoyce Corners so they know how to treat leg. Please call as soon as possible. Ask for Cynthie or Rogelio Seen (Associate Professor) [phone] ?

## 2021-12-20 NOTE — Telephone Encounter (Signed)
Per Dr. Audrie Lia notes, to have office get consent for surgery with pt's brother, will need to call SNF to post surgery date. He will have hardware excision and placed with abx beads and IV abx for 8 weeks after. More than likely will consult ID prior to surgery for this IV abx. No abx at this time. ?

## 2021-12-20 NOTE — Telephone Encounter (Signed)
Called and left detailed message for Jeremy Browning of info below. ?

## 2022-01-16 ENCOUNTER — Encounter: Payer: Self-pay | Admitting: Orthopedic Surgery

## 2022-01-16 NOTE — Progress Notes (Signed)
Office Visit Note   Patient: Jeremy Browning           Date of Birth: 08-19-1961           MRN: [id number] Visit Date: 12/19/2021              Requested by: Townsend Roger, MD [address] [place],  [place] [address] PCP: Townsend Roger, MD  Chief Complaint  Patient presents with   Left Leg - Follow-up      HPI: Patient is a 61 year old gentleman who presents in follow-up for chronic osteomyelitis left tibia with infected deep hardware status post tibial plateau fracture fixation at [facility].  Assessment & Plan: Visit Diagnoses:  1. 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 Hardware complicating wound infection, sequela     Plan: Discussed with the patient recommendation to proceed with removal of the deep retained hardware debridement of the infected bone and placement of antibiotics.  Discussed the patient would also need IV antibiotics for about 6 weeks.  Follow-Up Instructions: Return in about 2 weeks (around 01/02/2022).   Ortho Exam  Patient is alert, oriented, no adenopathy, well-dressed, normal affect, normal respiratory effort. Examination patient has a palpable dorsalis pedis pulse there is purulent drainage from the lateral plate.  Patient has a history of seizures and was on Dilantin.  Patient does have venous stasis insufficiency and swelling.  Patient has a stroke involving the left hand.  Patient has good active range of motion of the left lower extremity.  Imaging: No results found. No images are attached to the encounter.  Labs: Lab Results  Component Value Date   HGBA1C 7.1 (H) 02/26/2021   HGBA1C 5.9 (H) 12/25/2020   ESRSEDRATE 23 (H) 11/07/2021   ESRSEDRATE 118 (H) 08/11/2021   ESRSEDRATE 116 (H) 02/03/2021   CRP 18.7 (H) 08/11/2021   CRP 30.4 (H) 06/14/2021   LABURIC 7.9 02/03/2021   REPTSTATUS 11/08/2021 FINAL 11/07/2021   REPTSTATUS 11/12/2021 FINAL 11/07/2021   GRAMSTAIN  11/07/2021    FEW WBC PRESENT, PREDOMINANTLY  PMN NO ORGANISMS SEEN    CULT MULTIPLE SPECIES PRESENT, SUGGEST RECOLLECTION (A) 11/07/2021   CULT  11/07/2021    FEW STAPHYLOCOCCUS AUREUS NO ANAEROBES ISOLATED Performed at [facility], [place] [address] [place], [place] [address]    LABORGA STAPHYLOCOCCUS AUREUS 11/07/2021     Lab Results  Component Value Date   ALBUMIN 3.4 (L) 11/06/2021   ALBUMIN 3.1 (L) 04/08/2021   ALBUMIN 3.2 (L) 03/07/2021    Lab Results  Component Value Date   MG 2.0 11/08/2021   MG 2.2 11/06/2021   MG 2.3 03/07/2021   Lab Results  Component Value Date   VD25OH 25.76 (L) 02/12/2021    No results found for: PREALBUMIN    Latest Ref Rng & Units 11/08/2021    3:52 AM 11/06/2021   11:05 PM 08/11/2021   12:03 PM  CBC EXTENDED  WBC 4.0 - 10.5 K/uL 7.8   7.4   6.9    RBC 4.22 - 5.81 MIL/uL 5.13   5.66   5.24    Hemoglobin 13.0 - 17.0 g/dL 12.6   13.8   12.7    HCT 39.0 - 52.0 % 40.1   45.3   40.8    Platelets 150 - 400 K/uL 184   208   223    NEUT# 1.7 - 7.7 K/uL  4.9     Lymph# 0.7 -  4.0 K/uL  1.7        There is no height or weight on file to calculate BMI.  Orders:  No orders of the defined types were placed in this encounter.  No orders of the defined types were placed in this encounter.    Procedures: No procedures performed  Clinical Data: No additional findings.  ROS:  All other systems negative, except as noted in the HPI. Review of Systems  Objective: Vital Signs: There were no vitals taken for this visit.  Specialty Comments:  No specialty comments available.  PMFS History: Patient Active Problem List   Diagnosis Date Noted   Acute on chronic HFrEF (heart failure with reduced ejection fraction) (HCC)    Breakthrough seizure ([place]) 11/07/2021   NSTEMI (non-ST elevated myocardial infarction) ([place]) 11/07/2021   Stage 3a chronic kidney disease ([place]) 11/07/2021   Seizure disorder as sequela of cerebrovascular accident ([place]) 11/07/2021   Demand ischemia ([place])     Status post open reduction and internal fixation (ORIF) of fracture 09/07/2021   Medication monitoring encounter 08/11/2021   Draining cutaneous sinus tract 08/11/2021   Chronic osteomyelitis ([place]) 08/11/2021   Morbid obesity ([place]) 05/14/2021   OSA (obstructive sleep apnea) 05/14/2021   Complete left bundle branch block (LBBB) 05/14/2021   Chronic venous insufficiency -Left Leg > Right. 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   Essential hypertension 02/16/2021   Pressure injury of skin 02/10/2021   Oropharyngeal dysphagia    HFrEF (heart failure with reduced ejection fraction) ([place])    h/o Cardiopulmonary arrest with successful resuscitation ([place]) 12/25/2020   H/O Acute respiratory failure with hypoxia and hypercapnia ([place]) 12/25/2020   Acute renal failure ([place]) 12/25/2020   Wenckebach second degree AV block 12/25/2020   Lactic acidosis 12/25/2020   Delayed surgical wound healing 06/23/2020   Skin ulcer of knee, left, limited to breakdown of skin ([place]) 06/23/2020   BMI 40.0-44.9, adult ([place]) 12/12/2019   Left ventricular dyssynchrony 09/14/2018   Hyperlipidemia 09/13/2018   Tobacco abuse 02/07/2017   Controlled type 2 diabetes mellitus without complication, without long-term current use of insulin ([place]) 06/15/2016   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  By report no cardiac surgery   Severe hypoxic-ischemic encephalopathy    Venous  stasis dermatitis of left lower extremity     Family History  Problem Relation Age of Onset   Asthma Mother    Hypertension Mother        LIkely Severe/Malignant   Kidney failure Mother        ESRD - HD   CVA Mother    Alcohol abuse Father    Depression Father    Asthma Sister    Hypertension Sister    Diabetes Mellitus II Sister    Asthma Brother    Hypertension Brother     Past Surgical History:  Procedure Laterality Date   LEFT HEART CATH AND CORONARY ANGIOGRAPHY N/A 12/25/2020   Procedure: LEFT  HEART CATH AND CORONARY ANGIOGRAPHY;  Surgeon: Leonie Man, MD;  Location: [place] [facility];  Service: Cardiovascular;  Laterality: N/A; (In setting of Acute Hypoxic and Hypercapnic Respiratory Failure with Flash Pulm Edema/Hypertensive Emergency) - Angiographically minimal CAD.  Moderately reduced LV function with EF of 35 to 45%. LV P-EDP 101/21 mmHg - 30 mmHg   LEFT HEART CATH AND CORONARY ANGIOGRAPHY  09/12/2018   [facility]): Ostial LAD ~20% otherwise normal coronary arteries. LVEDP 20 mmHg;- HTN Emergency related Type II MI>   PEG PLACEMENT N/A 01/11/2021   Procedure: PERCUTANEOUS ENDOSCOPIC GASTROSTOMY (PEG) PLACEMENT;  Surgeon: Lucilla Lame, MD;  Location: [facility] ENDOSCOPY;  Service: Endoscopy;  Laterality: N/A;   RIGHT HEART CATH N/A 12/25/2020   Procedure: RIGHT HEART CATH;  Surgeon: Leonie Man, MD;  Location: [facility];  Service: Cardiovascular; R IJ: (Acute Hypoxic and Hypercapnic Respiratory Failure with Flash Pulm Edema/Hypertensive Emergency): CO-CI 6.54-2.82. Mild Secondary Pulmonary Hypertension (PCWP 30 mmHg.  PAP 47/13 mmHg with a mean PAP of 33 mmHg, RAP 22 mmHg in setting of AoP-MAP 97/59 mmHg - 71 mmHg,   TRACHEOSTOMY TUBE PLACEMENT N/A 01/12/2021   Procedure: TRACHEOSTOMY;  Surgeon: Carloyn Manner, MD;  Location: [facility] ORS;  Service: ENT;  Laterality: N/A;   TRANSTHORACIC ECHOCARDIOGRAM  09/11/2018   [facility]: Moderately reduced LVEF ~35%. with mild  LVH - WMA related to IVCD/LBBB -dyssynchronous. Mild-mod AI, Trivial MR?TR. LVEDD 6.0 (reduced compared to 2017: EF ~40-45%, LVEDD 5.6   TRANSTHORACIC ECHOCARDIOGRAM  12/27/2020   [facility]: (In Setting of Acute Hypoxic and Hypercapnic Respiratory Failure with Flash Pulm Edema/Hypertensive Emergency)  EF 45-50%. Mildly reduced LVEF with global HK. Moderately dilated LV with Gr 1 DD - mild LA dilation. Normal RV size Fxn.  Normal Vavles   TRANSTHORACIC ECHOCARDIOGRAM  03/07/2021   [facility]: EF improved to 60-65% with Nl LV Fxn & ~ normal diastolic fxn. Normal RV size & fxn. Mild MR & AI.  Normal LA size.   Unknown     Social History   Occupational History   Not on file  Tobacco Use   Smoking status: Former    Packs/day: 0.50    Years: 7.00    Pack years: 3.50    Types: Cigarettes    Quit date: 09/11/2018    Years since quitting: 3.3   Smokeless tobacco: Never  Vaping Use   Vaping Use: Never used  Substance and Sexual Activity   Alcohol use: Not Currently   Drug use: Not Currently   Sexual activity: Not Currently

## 2022-02-09 ENCOUNTER — Encounter (HOSPITAL_COMMUNITY): Payer: Self-pay | Admitting: Orthopedic Surgery

## 2022-02-09 NOTE — Progress Notes (Signed)
  SDW INSTRUCTIONS:  Your procedure is scheduled on 02/10/22. Please report to [facility] Main Entrance "A" at 08:10 A.M., and check in at the Admitting office. Call this number if you have problems the morning of surgery: [phone]   Remember: Do not eat or drink after midnight the night before your surgery   Medications to take morning of surgery with a sip of water include: aspirin  carvedilol (COREG) cefadroxil (DURICEF)  doxycycline (VIBRAMYCIN)  levETIRAcetam (KEPPRA) ZOLOFT   If needed: acetaminophen (TYLENOL)    As of today, STOP taking any Aleve, Naproxen, Ibuprofen, Motrin, Advil, Goody's, BC's, all herbal medications, fish oil, and all vitamins.   ** PLEASE check your blood sugar the morning of your surgery when you wake up and every 2 hours until you get to the Short Stay unit.  If your blood sugar is less than 70 mg/dL, you will need to treat for low blood sugar: Do not take insulin. Treat a low blood sugar (less than 70 mg/dL) with  cup of clear juice (cranberry or apple), 4 glucose tablets, OR glucose gel. Recheck blood sugar in 15 minutes after treatment (to make sure it is greater than 70 mg/dL). If your blood sugar is not greater than 70 mg/dL on recheck, call [phone] for further instructions.    The Morning of Surgery Do not wear jewelry Do not wear lotions, powders, colognes, or deodorant Do not bring valuables to the hospital. [facility] is not responsible for any belongings or valuables.  If you are a smoker, DO NOT Smoke 24 hours prior to surgery  If you wear a CPAP at night please bring your mask the morning of surgery   Remember that you must have someone to transport you home after your surgery, and remain with you for 24 hours if you are discharged the same day.  Please bring cases for contacts, glasses, hearing aids, dentures or bridgework because it cannot be worn into surgery.   Patients discharged the day of surgery will not be  allowed to drive home.   Please shower the NIGHT BEFORE/MORNING OF SURGERY (use antibacterial soap like DIAL soap if possible). Wear comfortable clothes the morning of surgery. Oral Hygiene is also important to reduce your risk of infection.  Remember - BRUSH YOUR TEETH THE MORNING OF SURGERY WITH YOUR REGULAR TOOTHPASTE  Patient denies shortness of breath, fever, cough and chest pain.

## 2022-02-09 NOTE — Progress Notes (Signed)
Anesthesia Chart Review: SAME DAY WORK-UP  Case: [id number] Date/Time: 02/10/22 1025   Procedures:      HARDWARE REMOVAL LEFT TIBIA (Left)     DEBRIDEMENT OF LEFT TIBIA (Left)   Anesthesia type: Choice   Pre-op diagnosis: Osteomyelitis Left Tibia   Location: [facility] OR ROOM 05 / [facility]   Surgeons: Newt Minion, MD       DISCUSSION: Patient is a 61 year old male scheduled for the above procedure.  History includes former smoker (quit 09/11/18), DM2, HTN, non-ischemic cardiomyopathy (diagnosed 08/2018), combined systolic and diastolic CHF (99991111; acute 12/2020, see below), Proteus PNA (12/2020), tracheostomy (01/12/21-02/10/21), CVA (12/2021), seizures, ORIF left tibial fracture (10/02/13), OSA (moderate 2018).    - [facility] hospitalization 12/25/20-03/08/21 for acute hypoxic respiratory distress with home O2 sat 70% and placed on CPAP. Developed pulseless VT in ED, s/p CPR, epinephrine and successful defibrillation with ROSC. He was intubated. Exam consistent with flash pulmonary edema. S/p cardiac cath for NSTEMI/cardiac arrest showing normal coronaries, acute combined systolic and diastolic CHF, global hypokinesis, EF ~ 40%, mildly reduced CO, secondary pulmonary hypertension. Acute renal failure and lactic acidosis precluded candidacy for ECMO. Had LBBB, intermittent Mobitz 1 AV block Desiree Hane). Started on Levophed for hypotension. Had Hospital course complicated by VDRF requiring tracheostomy (01/12/21-02/10/21), Proteus Mirabilis PNA, G-tube (01/11/21- August 20223), anoxic encephalopathy. MRI showed acute infarct in right cerebellum and right cerebral hemisphere and had left sided flaccid paralysis. EEG showed no acute seizure. Neurology consulted on 03/06/21 and "Suspect the patient had pre-existing chronic occlusion or stenosis that resulted in asymmetric hypoxic brain injury rather than acute embolism during cardiac arrest." Family declined LTAC. Patient on RA and tolerating oral diet by discharge to  SNF. LVEF improved to 60-65% on 03/08/21 echo.   - ED visit 04/08/21 for seizure in setting of recent CVA. Discussed with neurology who agrees with loading with Keppra and starting the patient on Keppra 500 mg twice daily.   - [facility] admission 11/06/21-11/08/21 for seizure, lactic acidosis with chronic osteomyelitis with hardware infection, elevated troponin. Echo showed interval decline in LVEF to 40-45%. Cardiac cath in 12/2020 showed normal coronaries. Cardiology was consulted and felt elevated troponin was related to supply-demand mismatch in setting of acute illness, and did not recommend invasive work-up at that time. Neurology increased Keppra. Surgery for his infected left tibia hardware was planned but postponed due to recent seizure, so antibiotics and local wound care continued.   He is now s/p 3 months for recurrent seizure hospitalization. His neurologist is Dr. Manuella Ghazi with [facility].   Patient is a same day work-up. Anesthesia team to evaluate on the day of surgery.   VS:  BP Readings from Last 3 Encounters:  11/08/21 (!) 127/42  09/16/21 115/60  09/08/21 136/80   Pulse Readings from Last 3 Encounters:  11/08/21 69  09/16/21 (!) 55  09/08/21 66      PROVIDERS: Townsend Roger, MD is PCP  Kate Sable, MD is cardiologist Prudencio Pair, MD/[place], Jenny Reichmann, MD is ID Jennings Books, MD is neurologist [facility] [place])  LABS: Labs on arrival as indicated. Most recent lab results include: Lab Results  Component Value Date   WBC 7.8 11/08/2021   HGB 12.6 (L) 11/08/2021   HCT 40.1 11/08/2021   PLT 184 11/08/2021   GLUCOSE 111 (H) 11/08/2021   ALT 18 11/06/2021   AST 41 11/06/2021   NA 137 11/08/2021   K 3.9 11/08/2021   CL 107 11/08/2021  CREATININE 1.49 (H) 11/08/2021   BUN 18 11/08/2021   CO2 23 11/08/2021   TSH 4.654 (H) 11/06/2021   INR 1.2 11/06/2021   HGBA1C 7.1 (H) 02/26/2021    OTHER: EEG 11/07/21: IMPRESSION: This study is suggestive of  cortical dysfunction arising from right hemisphere likely secondary to underlying structural abnormality. Additionally, there is Mild diffuse encephalopathy, nonspecific etiology. No seizures or epileptiform discharges were seen throughout the recording.  Sleep Study 08/22/16 ([facility]): INTERPRETATION: This overnight polysomnogram demonstrates that the patient has:  Moderate obstructive sleep apnea (AHI 28/hr) Decreased sleep efficiency   IMAGES: CT Head 11/06/21: IMPRESSION: 1. No acute intracranial abnormality. 2. Progressive encephalomalacia within the right MCA territory, at the site of old infarct.  CTA Head/Neck 03/06/21: IMPRESSION: 1. Suboptimal CT angiogram. Aortic arch not included. There is suboptimal arterial opacification. 2. Atherosclerotic calcification of the carotid bifurcation bilaterally. Estimated 25% diameter stenosis on the right and 50% diameter stenosis on the left. 3. Occlusion versus severe stenosis right M1 segment. There is a small vessel in this area which could be a branch vessel or severely stenotic right M1 segment. There is poor perfusion of right MCA branches consistent with MCA infarct. 4. Diffuse intracranial atherosclerotic disease.   EKG:  EKG 11/07/21: Sinus tachycardia at 101 bpm LVH with secondary repolarization abnormality Anterior Q waves, possibly due to LVH Prolonged QT interval (QT 399 ms, QTc 518 ms)  EKG 11/06/21:  Sinus or ectopic atrial tachycardia at 117 bpm Left bundle branch block   CV: Echo 11/07/21: IMPRESSIONS   1. Left ventricular ejection fraction, by estimation, is 40 to 45%. The  left ventricle has mildly decreased function. Left ventricular endocardial  border not optimally defined to evaluate regional wall motion. There is  mild left ventricular hypertrophy.   Left ventricular diastolic parameters are consistent with Grade I  diastolic dysfunction (impaired relaxation).   2. Right ventricular systolic function  is normal. The right ventricular  size is normal. Tricuspid regurgitation signal is inadequate for assessing  PA pressure.   3. The mitral valve is normal in structure. Trivial mitral valve  regurgitation. No evidence of mitral stenosis.   4. The aortic valve is normal in structure. Aortic valve regurgitation is  trivial. Aortic valve sclerosis is present, with no evidence of aortic  valve stenosis.  - LVEF 60-65% 03/07/21 & 45-50% 12/27/20    Cardiac cath 12/25/20 (in setting of post-cardiopulmonary arrest): 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 LV End Diastolic Pressure and Pulmonary Capillary Wedge Pressure are both severely elevated. There is moderate left ventricular systolic dysfunction. The left ventricular ejection fraction is 35-45% by visual estimate. Respiratory acidosis with pH of 7.0 and PCO2 of 93.   POST-OPERATIVE DIAGNOSIS:  Angiographically normal coronary arteries with no obvious culprit lesion to explain heart failure this level of hypoxia or hypercapnia. Acute combined systolic and diastolic heart failure: EF of roughly 40 to 45% with global hypokinesis: PCWP and LVEDP of 30 mmHg.   Cardiac Output and Index relatively normal: CO 6.54, CI 2.82 (mildly reduced). AoP-MAP 97/59 mmHg - 71 mmHg; LVP-EDP 101/21-30 mmHg.;  PCWP 30 mmHg, PAP 47/17 mmHg-mean 33 mmHg.  RAP mean 22 mmHg. Secondary Pulmonary Hypertension     RECOMMENDATIONS Patient needs ICU care with aggressive pulmonary management to improve oxygenation and ventilation. The presence of acute renal failure and lactic acidosis precludes [facility] per Dr. Haroldine Laws.  He recommends aggressive pulmonary management and diuresis.  May require milrinone. Agree with  Levophed for blood pressure assistance.   Major concern is ocation of care-he likely will need to be transferred to higher level care center.  Currently working on bed availability at either [facility] or [facility].   Past Medical History:  Diagnosis Date   Diabetes mellitus type II, non insulin dependent (HCC)    Essential hypertension Irving Burton like   h/o Proteus pneumonia (HCC) 02/17/2021   Heart failure (HCC)    Unknown details.  By report no cardiac surgery   Severe hypoxic-ischemic encephalopathy    Venous stasis dermatitis of left lower extremity     Past Surgical History:  Procedure Laterality Date   LEFT HEART CATH AND CORONARY ANGIOGRAPHY N/A 12/25/2020   Procedure: LEFT HEART CATH AND CORONARY ANGIOGRAPHY;  Surgeon: Marykay Lex, MD;  Location: [facility] INVASIVE CV LAB;  Service: Cardiovascular;  Laterality: N/A; (In setting of Acute Hypoxic and Hypercapnic Respiratory Failure with Flash Pulm Edema/Hypertensive Emergency) - Angiographically minimal CAD.  Moderately reduced LV function with EF of 35 to 45%. LV P-EDP 101/21 mmHg - 30 mmHg   LEFT HEART CATH AND CORONARY ANGIOGRAPHY  09/12/2018   [facility]): Ostial LAD ~20% otherwise normal coronary arteries. LVEDP 20 mmHg;- HTN Emergency related Type II MI>   PEG PLACEMENT N/A 01/11/2021   Procedure: PERCUTANEOUS ENDOSCOPIC GASTROSTOMY (PEG) PLACEMENT;  Surgeon: Midge Minium, MD;  Location: [facility] ENDOSCOPY;  Service: Endoscopy;  Laterality: N/A;   RIGHT HEART CATH N/A 12/25/2020   Procedure: RIGHT HEART CATH;  Surgeon: Marykay Lex, MD;  Location: [facility] INVASIVE CV LAB;  Service: Cardiovascular; R IJ: (Acute Hypoxic and Hypercapnic Respiratory Failure with Flash Pulm Edema/Hypertensive Emergency): CO-CI 6.54-2.82. Mild Secondary Pulmonary Hypertension (PCWP 30 mmHg.  PAP 47/13 mmHg with a mean PAP of 33 mmHg, RAP 22 mmHg in setting of AoP-MAP 97/59 mmHg - 71 mmHg,   TRACHEOSTOMY TUBE PLACEMENT N/A 01/12/2021   Procedure: TRACHEOSTOMY;  Surgeon: Bud Face, MD;  Location: [facility] ORS;  Service: ENT;  Laterality: N/A;   TRANSTHORACIC ECHOCARDIOGRAM  09/11/2018   [facility]: Moderately reduced LVEF ~35%. with mild LVH - WMA related to IVCD/LBBB  -dyssynchronous. Mild-mod AI, Trivial MR?TR. LVEDD 6.0 (reduced compared to 2017: EF ~40-45%, LVEDD 5.6   TRANSTHORACIC ECHOCARDIOGRAM  12/27/2020   [facility]: (In Setting of Acute Hypoxic and Hypercapnic Respiratory Failure with Flash Pulm Edema/Hypertensive Emergency)  EF 45-50%. Mildly reduced LVEF with global HK. Moderately dilated LV with Gr 1 DD - mild LA dilation. Normal RV size Fxn.  Normal Vavles   TRANSTHORACIC ECHOCARDIOGRAM  03/07/2021   [facility]: EF improved to 60-65% with Nl LV Fxn & ~ normal diastolic fxn. Normal RV size & fxn. Mild MR & AI.  Normal LA size.   Unknown      MEDICATIONS: No current facility-administered medications for this encounter.    acetaminophen (TYLENOL) 325 MG tablet   aspirin EC 81 MG tablet   atorvastatin (LIPITOR) 80 MG tablet   carvedilol (COREG) 6.25 MG tablet   cefadroxil (DURICEF) 500 MG capsule   diclofenac Sodium (VOLTAREN) 1 % GEL   doxycycline (VIBRAMYCIN) 100 MG capsule   FLOMAX 0.4 MG CAPS capsule   furosemide (LASIX) 20 MG tablet   levETIRAcetam (KEPPRA) 1000 MG tablet   losartan (COZAAR) 25 MG tablet   MIRALAX 17 GM/SCOOP powder   Nutritional Supplements (FEEDING SUPPLEMENT, NEPRO CARB STEADY,) LIQD   Potassium Bicarb-Citric Acid (EFFER-K) 10 MEQ TBEF   ZOLOFT 25 MG tablet   levETIRAcetam (KEPPRA) 500 MG tablet  Shonna Chock, PA-C Surgical Short Stay/Anesthesiology [facility] Phone [phone] [facility] Phone [phone] 02/09/2022 2:03 PM

## 2022-02-09 NOTE — Anesthesia Preprocedure Evaluation (Addendum)
Anesthesia Evaluation  Patient identified by MRN, date of birth, ID band Patient awake    Reviewed: Allergy & Precautions, H&P , NPO status , Patient's Chart, lab work & pertinent test results  Airway Mallampati: Trach   Neck ROM: full    Dental   Pulmonary sleep apnea , COPD, former smoker,    breath sounds clear to auscultation       Cardiovascular hypertension, +CHF  + dysrhythmias  Rhythm:regular Rate:Normal     Neuro/Psych Seizures -,  CVA    GI/Hepatic   Endo/Other  diabetes, Type 2  Renal/GU      Musculoskeletal  (+) Arthritis ,   Abdominal   Peds  Hematology   Anesthesia Other Findings   Reproductive/Obstetrics                            Anesthesia Physical Anesthesia Plan  ASA: 3  Anesthesia Plan: MAC and Regional   Post-op Pain Management:    Induction: Intravenous  PONV Risk Score and Plan: 1 and Propofol infusion, Midazolam, Ondansetron and Treatment may vary due to age or medical condition  Airway Management Planned: Tracheostomy  Additional Equipment:   Intra-op Plan:   Post-operative Plan:   Informed Consent: I have reviewed the patients History and Physical, chart, labs and discussed the procedure including the risks, benefits and alternatives for the proposed anesthesia with the patient or authorized representative who has indicated his/her understanding and acceptance.     Dental advisory given  Plan Discussed with: CRNA, Anesthesiologist and Surgeon  Anesthesia Plan Comments: (See PAT note written 02/09/2022 by Shonna Chock, PA-C. )       Anesthesia Quick Evaluation

## 2022-02-10 ENCOUNTER — Encounter (HOSPITAL_COMMUNITY)
Admission: RE | Disposition: A | Discharge status: Rehabilitation Facility | Payer: Self-pay | Source: Ambulatory Visit | Attending: Orthopedic Surgery

## 2022-02-10 ENCOUNTER — Encounter (HOSPITAL_COMMUNITY): Payer: Self-pay | Admitting: Orthopedic Surgery

## 2022-02-10 ENCOUNTER — Ambulatory Visit (HOSPITAL_COMMUNITY)
Admission: RE | Admit: 2022-02-10 | Discharge: 2022-02-10 | Disposition: A | Discharge status: Rehabilitation Facility | Payer: Medicaid Other | Source: Ambulatory Visit | Attending: Orthopedic Surgery | Admitting: Orthopedic Surgery

## 2022-02-10 ENCOUNTER — Inpatient Hospital Stay (HOSPITAL_COMMUNITY): Payer: Medicaid Other | Admitting: Physician Assistant

## 2022-02-10 ENCOUNTER — Inpatient Hospital Stay (HOSPITAL_COMMUNITY): Payer: Medicaid Other

## 2022-02-10 ENCOUNTER — Inpatient Hospital Stay (HOSPITAL_BASED_OUTPATIENT_CLINIC_OR_DEPARTMENT_OTHER): Payer: Medicaid Other | Admitting: Physician Assistant

## 2022-02-10 ENCOUNTER — Other Ambulatory Visit: Payer: Self-pay

## 2022-02-10 DIAGNOSIS — M86462 Chronic osteomyelitis with draining sinus, left tibia and fibula: Secondary | ICD-10-CM | POA: Insufficient documentation

## 2022-02-10 DIAGNOSIS — Y792 Prosthetic and other implants, materials and accessory orthopedic devices associated with adverse incidents: Secondary | ICD-10-CM | POA: Insufficient documentation

## 2022-02-10 DIAGNOSIS — T847XXS Infection and inflammatory reaction due to other internal orthopedic prosthetic devices, implants and grafts, sequela: Secondary | ICD-10-CM | POA: Diagnosis not present

## 2022-02-10 DIAGNOSIS — I509 Heart failure, unspecified: Secondary | ICD-10-CM

## 2022-02-10 DIAGNOSIS — I110 Hypertensive heart disease with heart failure: Secondary | ICD-10-CM | POA: Insufficient documentation

## 2022-02-10 DIAGNOSIS — M86262 Subacute osteomyelitis, left tibia and fibula: Secondary | ICD-10-CM | POA: Diagnosis not present

## 2022-02-10 DIAGNOSIS — J449 Chronic obstructive pulmonary disease, unspecified: Secondary | ICD-10-CM | POA: Diagnosis not present

## 2022-02-10 DIAGNOSIS — E1169 Type 2 diabetes mellitus with other specified complication: Secondary | ICD-10-CM | POA: Insufficient documentation

## 2022-02-10 DIAGNOSIS — G4733 Obstructive sleep apnea (adult) (pediatric): Secondary | ICD-10-CM | POA: Diagnosis not present

## 2022-02-10 DIAGNOSIS — T84623A Infection and inflammatory reaction due to internal fixation device of left tibia, initial encounter: Secondary | ICD-10-CM | POA: Diagnosis not present

## 2022-02-10 DIAGNOSIS — M1990 Unspecified osteoarthritis, unspecified site: Secondary | ICD-10-CM | POA: Insufficient documentation

## 2022-02-10 DIAGNOSIS — T847XXA Infection and inflammatory reaction due to other internal orthopedic prosthetic devices, implants and grafts, initial encounter: Secondary | ICD-10-CM

## 2022-02-10 DIAGNOSIS — Z833 Family history of diabetes mellitus: Secondary | ICD-10-CM | POA: Diagnosis not present

## 2022-02-10 DIAGNOSIS — Z8673 Personal history of transient ischemic attack (TIA), and cerebral infarction without residual deficits: Secondary | ICD-10-CM | POA: Insufficient documentation

## 2022-02-10 DIAGNOSIS — Z87891 Personal history of nicotine dependence: Secondary | ICD-10-CM | POA: Insufficient documentation

## 2022-02-10 DIAGNOSIS — M869 Osteomyelitis, unspecified: Secondary | ICD-10-CM

## 2022-02-10 DIAGNOSIS — I5040 Unspecified combined systolic (congestive) and diastolic (congestive) heart failure: Secondary | ICD-10-CM | POA: Diagnosis not present

## 2022-02-10 HISTORY — PX: HARDWARE REMOVAL: SHX979

## 2022-02-10 HISTORY — DX: Sleep apnea, unspecified: G47.30

## 2022-02-10 HISTORY — DX: Cerebral infarction, unspecified: I63.9

## 2022-02-10 HISTORY — PX: I & D EXTREMITY: SHX5045

## 2022-02-10 HISTORY — DX: Unspecified convulsions: R56.9

## 2022-02-10 HISTORY — DX: Heart failure, unspecified: I50.9

## 2022-02-10 LAB — CBC
HCT: 40.7 % (ref 39.0–52.0)
Hemoglobin: 12.7 g/dL — ABNORMAL LOW (ref 13.0–17.0)
MCH: 26 pg (ref 26.0–34.0)
MCHC: 31.2 g/dL (ref 30.0–36.0)
MCV: 83.2 fL (ref 80.0–100.0)
Platelets: 191 10*3/uL (ref 150–400)
RBC: 4.89 MIL/uL (ref 4.22–5.81)
RDW: 14.1 % (ref 11.5–15.5)
WBC: 6 10*3/uL (ref 4.0–10.5)
nRBC: 0 % (ref 0.0–0.2)

## 2022-02-10 LAB — GLUCOSE, CAPILLARY
Glucose-Capillary: 93 mg/dL (ref 70–99)
Glucose-Capillary: 88 mg/dL (ref 70–99)

## 2022-02-10 SURGERY — REMOVAL, HARDWARE
Anesthesia: Monitor Anesthesia Care | Laterality: Left

## 2022-02-10 MED ORDER — 0.9 % SODIUM CHLORIDE (POUR BTL) OPTIME
TOPICAL | Status: DC | PRN
  Administered 2022-02-10: 1000 mL

## 2022-02-10 MED ORDER — ROPIVACAINE HCL 5 MG/ML IJ SOLN
INTRAMUSCULAR | Status: DC | PRN
  Administered 2022-02-10: 25 mL via PERINEURAL
  Administered 2022-02-10: 15 mL via PERINEURAL

## 2022-02-10 MED ORDER — PROPOFOL 10 MG/ML IV BOLUS
INTRAVENOUS | Status: AC
  Filled 2022-02-10: qty 20

## 2022-02-10 MED ORDER — FENTANYL CITRATE (PF) 100 MCG/2ML IJ SOLN
INTRAMUSCULAR | Status: DC | PRN
  Administered 2022-02-10: 25 ug via INTRAVENOUS

## 2022-02-10 MED ORDER — OXYCODONE HCL 5 MG/5ML PO SOLN: 5.0000 mg | Freq: Once | ORAL | Status: DC | PRN

## 2022-02-10 MED ORDER — CEFAZOLIN SODIUM-DEXTROSE 2-4 GM/100ML-% IV SOLN
2.0000 g | INTRAVENOUS | Status: AC
  Administered 2022-02-10: 2 g via INTRAVENOUS
  Filled 2022-02-10: qty 100

## 2022-02-10 MED ORDER — OXYCODONE HCL 5 MG PO TABS: 5.0000 mg | ORAL_TABLET | Freq: Once | ORAL | Status: DC | PRN

## 2022-02-10 MED ORDER — PROPOFOL 10 MG/ML IV BOLUS
INTRAVENOUS | Status: DC | PRN
  Administered 2022-02-10 (×2): 10 mg via INTRAVENOUS
  Administered 2022-02-10 (×3): 20 mg via INTRAVENOUS
  Administered 2022-02-10 (×3): 10 mg via INTRAVENOUS

## 2022-02-10 MED ORDER — VANCOMYCIN HCL 1000 MG IV SOLR
INTRAVENOUS | Status: AC
  Filled 2022-02-10: qty 20

## 2022-02-10 MED ORDER — OXYCODONE-ACETAMINOPHEN 5-325 MG PO TABS: 1.0000 | ORAL_TABLET | ORAL | 0 refills | Status: DC | PRN

## 2022-02-10 MED ORDER — LIDOCAINE 2% (20 MG/ML) 5 ML SYRINGE
INTRAMUSCULAR | Status: AC
  Filled 2022-02-10: qty 5

## 2022-02-10 MED ORDER — FENTANYL CITRATE (PF) 100 MCG/2ML IJ SOLN
INTRAMUSCULAR | Status: AC
  Filled 2022-02-10: qty 2

## 2022-02-10 MED ORDER — ONDANSETRON HCL 4 MG/2ML IJ SOLN
INTRAMUSCULAR | Status: AC
  Filled 2022-02-10: qty 2

## 2022-02-10 MED ORDER — ONDANSETRON HCL 4 MG/2ML IJ SOLN: 4.0000 mg | Freq: Four times a day (QID) | INTRAMUSCULAR | Status: DC | PRN

## 2022-02-10 MED ORDER — FENTANYL CITRATE (PF) 100 MCG/2ML IJ SOLN: 25.0000 ug | INTRAMUSCULAR | Status: DC | PRN

## 2022-02-10 MED ORDER — ORAL CARE MOUTH RINSE: 15.0000 mL | Freq: Once | OROMUCOSAL | Status: DC

## 2022-02-10 MED ORDER — MIDAZOLAM HCL 2 MG/2ML IJ SOLN
INTRAMUSCULAR | Status: AC
  Filled 2022-02-10: qty 2

## 2022-02-10 MED ORDER — VANCOMYCIN HCL 1000 MG IV SOLR
INTRAVENOUS | Status: DC | PRN
  Administered 2022-02-10: 1000 mg via TOPICAL

## 2022-02-10 MED ORDER — LACTATED RINGERS IV SOLN: INTRAVENOUS | Status: DC

## 2022-02-10 MED ORDER — CHLORHEXIDINE GLUCONATE 0.12 % MT SOLN
15.0000 mL | Freq: Once | OROMUCOSAL | Status: DC
  Filled 2022-02-10: qty 15

## 2022-02-10 MED ORDER — FENTANYL CITRATE (PF) 250 MCG/5ML IJ SOLN
INTRAMUSCULAR | Status: AC
  Filled 2022-02-10: qty 5

## 2022-02-10 SURGICAL SUPPLY — 55 items
BAG COUNTER SPONGE SURGICOUNT (BAG) ×2 IMPLANT
BANDAGE ESMARK 6X9 LF (GAUZE/BANDAGES/DRESSINGS) IMPLANT
BLADE SURG 21 STRL SS (BLADE) ×2 IMPLANT
BNDG COHESIVE 4X5 TAN STRL (GAUZE/BANDAGES/DRESSINGS) IMPLANT
BNDG COHESIVE 6X5 TAN ST LF (GAUZE/BANDAGES/DRESSINGS) ×1 IMPLANT
BNDG COHESIVE 6X5 TAN STRL LF (GAUZE/BANDAGES/DRESSINGS) IMPLANT
BNDG ESMARK 6X9 LF (GAUZE/BANDAGES/DRESSINGS) IMPLANT
BNDG GAUZE DERMACEA FLUFF (GAUZE/BANDAGES/DRESSINGS) ×1
BNDG GAUZE DERMACEA FLUFF 4 (GAUZE/BANDAGES/DRESSINGS) IMPLANT
BNDG GAUZE ELAST 4 BULKY (GAUZE/BANDAGES/DRESSINGS) ×2 IMPLANT
COVER SURGICAL LIGHT HANDLE (MISCELLANEOUS) ×4 IMPLANT
CUFF TOURN SGL QUICK 34 (TOURNIQUET CUFF)
CUFF TOURN SGL QUICK 42 (TOURNIQUET CUFF) IMPLANT
CUFF TRNQT CYL 34X4.125X (TOURNIQUET CUFF) IMPLANT
DRAPE C-ARM 42X72 X-RAY (DRAPES) IMPLANT
DRAPE INCISE IOBAN 66X45 STRL (DRAPES) IMPLANT
DRAPE ORTHO SPLIT 77X108 STRL (DRAPES)
DRAPE SURG ORHT 6 SPLT 77X108 (DRAPES) IMPLANT
DRAPE U-SHAPE 47X51 STRL (DRAPES) ×2 IMPLANT
DRSG ADAPTIC 3X8 NADH LF (GAUZE/BANDAGES/DRESSINGS) ×2 IMPLANT
DRSG EMULSION OIL 3X3 NADH (GAUZE/BANDAGES/DRESSINGS) ×1 IMPLANT
DRSG PAD ABDOMINAL 8X10 ST (GAUZE/BANDAGES/DRESSINGS) ×2 IMPLANT
DURAPREP 26ML APPLICATOR (WOUND CARE) ×2 IMPLANT
ELECT REM PT RETURN 9FT ADLT (ELECTROSURGICAL) ×2 IMPLANT
ELECTRODE REM PT RTRN 9FT ADLT (ELECTROSURGICAL) ×1 IMPLANT
GAUZE SPONGE 4X4 12PLY STRL (GAUZE/BANDAGES/DRESSINGS) ×2 IMPLANT
GLOVE BIOGEL PI IND STRL 9 (GLOVE) ×1 IMPLANT
GLOVE BIOGEL PI INDICATOR 9 (GLOVE) ×1
GLOVE SURG ORTHO 9.0 STRL STRW (GLOVE) ×2 IMPLANT
GOWN STRL REUS W/ TWL XL LVL3 (GOWN DISPOSABLE) ×3 IMPLANT
GOWN STRL REUS W/TWL XL LVL3 (GOWN DISPOSABLE) ×3
HANDPIECE INTERPULSE COAX TIP (DISPOSABLE)
KIT BASIN OR (CUSTOM PROCEDURE TRAY) ×2 IMPLANT
KIT TURNOVER KIT B (KITS) ×2 IMPLANT
MANIFOLD NEPTUNE II (INSTRUMENTS) ×2 IMPLANT
NS IRRIG 1000ML POUR BTL (IV SOLUTION) ×2 IMPLANT
PACK ORTHO EXTREMITY (CUSTOM PROCEDURE TRAY) ×2 IMPLANT
PAD ARMBOARD 7.5X6 YLW CONV (MISCELLANEOUS) ×4 IMPLANT
SET HNDPC FAN SPRY TIP SCT (DISPOSABLE) IMPLANT
SPONGE T-LAP 18X18 ~~LOC~~+RFID (SPONGE) IMPLANT
STAPLER VISISTAT 35W (STAPLE) IMPLANT
STOCKINETTE IMPERVIOUS 9X36 MD (GAUZE/BANDAGES/DRESSINGS) IMPLANT
SUT ETHILON 2 0 PSLX (SUTURE) ×2 IMPLANT
SUT VIC AB 0 CT1 27 (SUTURE)
SUT VIC AB 0 CT1 27XBRD ANBCTR (SUTURE) IMPLANT
SUT VIC AB 2-0 CT1 27 (SUTURE)
SUT VIC AB 2-0 CT1 TAPERPNT 27 (SUTURE) IMPLANT
SWAB COLLECTION DEVICE MRSA (MISCELLANEOUS) ×2 IMPLANT
SWAB CULTURE ESWAB REG 1ML (MISCELLANEOUS) IMPLANT
TOWEL GREEN STERILE (TOWEL DISPOSABLE) ×2 IMPLANT
TOWEL GREEN STERILE FF (TOWEL DISPOSABLE) ×2 IMPLANT
TUBE CONNECTING 12X1/4 (SUCTIONS) ×2 IMPLANT
UNDERPAD 30X36 HEAVY ABSORB (UNDERPADS AND DIAPERS) ×2 IMPLANT
WATER STERILE IRR 1000ML POUR (IV SOLUTION) ×2 IMPLANT
YANKAUER SUCT BULB TIP NO VENT (SUCTIONS) ×2 IMPLANT

## 2022-02-10 NOTE — Interval H&P Note (Signed)
History and Physical Interval Note:  02/10/2022 9:43 AM  Jeremy Browning  has presented today for surgery, with the diagnosis of Osteomyelitis Left Tibia.  The various methods of treatment have been discussed with the patient and family. After consideration of risks, benefits and other options for treatment, the patient has consented to  Procedure(s): HARDWARE REMOVAL LEFT TIBIA (Left) DEBRIDEMENT OF LEFT TIBIA (Left) as a surgical intervention.  The patient's history has been reviewed, patient examined, no change in status, stable for surgery.  I have reviewed the patient's chart and labs.  Questions were answered to the patient's satisfaction.     Nadara Mustard

## 2022-02-12 ENCOUNTER — Encounter (HOSPITAL_COMMUNITY): Payer: Self-pay | Admitting: Orthopedic Surgery

## 2022-02-13 ENCOUNTER — Encounter (HOSPITAL_COMMUNITY): Payer: Self-pay | Admitting: Orthopedic Surgery

## 2022-02-15 LAB — AEROBIC/ANAEROBIC CULTURE W GRAM STAIN (SURGICAL/DEEP WOUND)
Culture: NO GROWTH
Gram Stain: NONE SEEN

## 2022-02-20 ENCOUNTER — Ambulatory Visit (INDEPENDENT_AMBULATORY_CARE_PROVIDER_SITE_OTHER): Payer: Medicaid Other | Admitting: Orthopedic Surgery

## 2022-02-20 ENCOUNTER — Encounter: Payer: Self-pay | Admitting: Orthopedic Surgery

## 2022-02-20 DIAGNOSIS — T847XXS Infection and inflammatory reaction due to other internal orthopedic prosthetic devices, implants and grafts, sequela: Secondary | ICD-10-CM

## 2022-02-20 DIAGNOSIS — M86462 Chronic osteomyelitis with draining sinus, left tibia and fibula: Secondary | ICD-10-CM

## 2022-02-20 NOTE — Progress Notes (Signed)
Office Visit Note   Patient: Jeremy Browning           Date of Birth: 1960/08/25           MRN: [id number] Visit Date: 02/20/2022              Requested by: Townsend Roger, MD [address],  [place] [address] PCP: Townsend Roger, MD  Chief Complaint  Patient presents with   Left Leg - Routine Post Op    02/10/2022 HDW removal left tibia       HPI: Patient is a 61 year old gentleman who is seen 1 week status post removal of infected deep retained hardware left tibial plateau.  Patient states he does have swelling he states he has compression socks at skilled nursing but they have not been put on yet.  Assessment & Plan: Visit Diagnoses:  1. 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 Hardware complicating wound infection, sequela     Plan: Recommend patient wear his compression stockings daily wash the leg with soap and water.  Follow-Up Instructions: Return in about 4 weeks (around 03/20/2022).   Ortho Exam  Patient is alert, oriented, no adenopathy, well-dressed, normal affect, normal respiratory effort. Examination there is venous swelling and lymphatic swelling of the left lower extremity patient has no tenderness to palpation of the calf no tenderness with dorsiflexion of the ankle.  No signs of DVT.  The incision is well-healed there is no drainage.  Sutures are removed.  Patient may start showering at this time.  Imaging: No results found. No images are attached to the encounter.  Labs: Lab Results  Component Value Date   HGBA1C 7.1 (H) 02/26/2021   HGBA1C 5.9 (H) 12/25/2020   ESRSEDRATE 23 (H) 11/07/2021   ESRSEDRATE 118 (H) 08/11/2021   ESRSEDRATE 116 (H) 02/03/2021   CRP 18.7 (H) 08/11/2021   CRP 30.4 (H) 06/14/2021   LABURIC 7.9 02/03/2021   REPTSTATUS 02/15/2022 FINAL 02/10/2022   GRAMSTAIN NO WBC SEEN NO ORGANISMS SEEN  02/10/2022   CULT  02/10/2022    No growth aerobically or anaerobically. Performed at [facility], [place] [address] [place], [place] [address]    LABORGA STAPHYLOCOCCUS AUREUS 11/07/2021     Lab Results  Component Value Date   ALBUMIN 3.4 (L) 11/06/2021   ALBUMIN 3.1 (L) 04/08/2021   ALBUMIN 3.2 (L) 03/07/2021    Lab Results  Component Value Date   MG 2.0 11/08/2021   MG 2.2 11/06/2021   MG 2.3 03/07/2021   Lab Results  Component Value Date   VD25OH 25.76 (L) 02/12/2021    No results found for: "PREALBUMIN"    Latest Ref Rng & Units 02/10/2022    9:27 AM 11/08/2021    3:52 AM 11/06/2021   11:05 PM  CBC EXTENDED  WBC 4.0 - 10.5 K/uL 6.0  7.8  7.4   RBC 4.22 - 5.81 MIL/uL 4.89  5.13  5.66   Hemoglobin 13.0 - 17.0 g/dL 12.7  12.6  13.8   HCT 39.0 - 52.0 % 40.7  40.1  45.3   Platelets 150 - 400 K/uL 191  184  208   NEUT# 1.7 - 7.7 K/uL   4.9   Lymph# 0.7 - 4.0 K/uL   1.7      There is no height or weight on file to calculate BMI.  Orders:  No orders of the defined types were placed in  this encounter.  No orders of the defined types were placed in this encounter.    Procedures: No procedures performed  Clinical Data: No additional findings.  ROS:  All other systems negative, except as noted in the HPI. Review of Systems  Objective: Vital Signs: There were no vitals taken for this visit.  Specialty Comments:  No specialty comments available.  PMFS History: Patient Active Problem List   Diagnosis Date Noted   Subacute osteomyelitis of left tibia [facility])    Hardware complicating wound infection (HCC)    Acute on chronic HFrEF (heart failure with reduced ejection fraction) (HCC)    Breakthrough seizure (HCC) 11/07/2021   NSTEMI (non-ST elevated myocardial infarction) (HCC) 11/07/2021   Stage 3a chronic kidney disease (HCC) 11/07/2021   Seizure disorder as sequela of cerebrovascular accident (HCC) 11/07/2021   Demand ischemia (HCC)    Status post open reduction and internal fixation (ORIF) of fracture 09/07/2021   Medication monitoring  encounter 08/11/2021   Draining cutaneous sinus tract 08/11/2021   Chronic osteomyelitis (HCC) 08/11/2021   Morbid obesity (HCC) 05/14/2021   OSA (obstructive sleep apnea) 05/14/2021   Complete left bundle branch block (LBBB) 05/14/2021   Chronic venous insufficiency -Left Leg > Right. 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   Essential hypertension 02/16/2021   Pressure injury of skin 02/10/2021   Oropharyngeal dysphagia    HFrEF (heart failure with reduced ejection fraction) (HCC)    h/o Cardiopulmonary arrest with successful resuscitation (HCC) 12/25/2020   H/O Acute respiratory failure with hypoxia and hypercapnia (HCC) 12/25/2020   Acute renal failure (HCC) 12/25/2020   Wenckebach second degree AV block 12/25/2020   Lactic acidosis 12/25/2020   Delayed surgical wound healing 06/23/2020   Skin ulcer of knee, left, limited to breakdown of skin (HCC) 06/23/2020   BMI 40.0-44.9, adult (HCC) 12/12/2019   Left ventricular dyssynchrony 09/14/2018   Hyperlipidemia 09/13/2018   Tobacco abuse 02/07/2017   Controlled type 2 diabetes mellitus without complication, without long-term current use of insulin (HCC) 06/15/2016   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  By report no cardiac surgery   Seizures (HCC)    Severe hypoxic-ischemic encephalopathy    Sleep apnea    Stroke (HCC)    Venous stasis  dermatitis of left lower extremity     Family History  Problem Relation Age of Onset   Asthma Mother    Hypertension Mother        LIkely Severe/Malignant   Kidney failure Mother        ESRD - HD   CVA Mother    Alcohol abuse Father    Depression Father    Asthma Sister    Hypertension Sister    Diabetes Mellitus II Sister    Asthma Brother    Hypertension Brother     Past Surgical History:  Procedure Laterality Date   HARDWARE REMOVAL Left 02/10/2022  Procedure: HARDWARE REMOVAL LEFT TIBIA;  Surgeon: Nadara Mustard, MD;  Location: [facility] OR;  Service: Orthopedics;  Laterality: Left;   I & D EXTREMITY Left 02/10/2022   Procedure: DEBRIDEMENT OF LEFT TIBIA;  Surgeon: Nadara Mustard, MD;  Location: [facility] OR;  Service: Orthopedics;  Laterality: Left;   LEFT HEART CATH AND CORONARY ANGIOGRAPHY N/A 12/25/2020   Procedure: LEFT HEART CATH AND CORONARY ANGIOGRAPHY;  Surgeon: Marykay Lex, MD;  Location: [facility] INVASIVE CV LAB;  Service: Cardiovascular;  Laterality: N/A; (In setting of Acute Hypoxic and Hypercapnic Respiratory Failure with Flash Pulm Edema/Hypertensive Emergency) - Angiographically minimal CAD.  Moderately reduced LV function with EF of 35 to 45%. LV P-EDP 101/21 mmHg - 30 mmHg   LEFT HEART CATH AND CORONARY ANGIOGRAPHY  09/12/2018   [facility]): Ostial LAD ~20% otherwise normal coronary arteries. LVEDP 20 mmHg;- HTN Emergency related Type II MI>   PEG PLACEMENT N/A 01/11/2021   Procedure: PERCUTANEOUS ENDOSCOPIC GASTROSTOMY (PEG) PLACEMENT;  Surgeon: Midge Minium, MD;  Location: [facility] ENDOSCOPY;  Service: Endoscopy;  Laterality: N/A;   RIGHT HEART CATH N/A 12/25/2020   Procedure: RIGHT HEART CATH;  Surgeon: Marykay Lex, MD;  Location: [facility] INVASIVE CV LAB;  Service: Cardiovascular; R IJ: (Acute Hypoxic and Hypercapnic Respiratory Failure with Flash Pulm Edema/Hypertensive Emergency): CO-CI 6.54-2.82. Mild Secondary Pulmonary Hypertension (PCWP 30 mmHg.  PAP 47/13 mmHg with a mean  PAP of 33 mmHg, RAP 22 mmHg in setting of AoP-MAP 97/59 mmHg - 71 mmHg,   TRACHEOSTOMY TUBE PLACEMENT N/A 01/12/2021   Procedure: TRACHEOSTOMY;  Surgeon: Bud Face, MD;  Location: [facility] ORS;  Service: ENT;  Laterality: N/A;   TRANSTHORACIC ECHOCARDIOGRAM  09/11/2018   [facility]: Moderately reduced LVEF ~35%. 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 Mild-mod AI, Trivial MR?TR. LVEDD 6.0 (reduced compared to 2017: EF ~40-45%, LVEDD 5.6   TRANSTHORACIC ECHOCARDIOGRAM  12/27/2020   [facility]: (In Setting of Acute Hypoxic and Hypercapnic Respiratory Failure with Flash Pulm Edema/Hypertensive Emergency)  EF 45-50%. Mildly reduced LVEF with global HK. Moderately dilated LV with Gr 1 DD - mild LA dilation. Normal RV size Fxn.  Normal Vavles   TRANSTHORACIC ECHOCARDIOGRAM  03/07/2021   [facility]: EF improved to 60-65% with Nl LV Fxn & ~ normal diastolic fxn. Normal RV size & fxn. Mild MR & AI.  Normal LA size.   Unknown     Social History   Occupational History   Not on file  Tobacco Use   Smoking status: Former    Packs/day: 0.50    Years: 7.00    Total pack years: 3.50    Types: Cigarettes    Quit date: 09/11/2018    Years since quitting: 3.4   Smokeless tobacco: Never  Vaping Use   Vaping Use: Never used  Substance and Sexual Activity   Alcohol use: Not Currently   Drug use: Not Currently   Sexual activity: Not Currently

## 2022-03-08 ENCOUNTER — Inpatient Hospital Stay (HOSPITAL_COMMUNITY): Payer: Medicaid Other

## 2022-03-08 ENCOUNTER — Other Ambulatory Visit: Payer: Self-pay

## 2022-03-08 ENCOUNTER — Inpatient Hospital Stay: Payer: Medicaid Other

## 2022-03-08 ENCOUNTER — Emergency Department: Payer: Medicaid Other

## 2022-03-08 ENCOUNTER — Inpatient Hospital Stay (HOSPITAL_COMMUNITY)
Admit: 2022-03-08 | Discharge: 2022-03-08 | Disposition: A | Discharge status: Home / Self Care | Payer: Medicaid Other | Attending: Neurology | Admitting: Neurology

## 2022-03-08 ENCOUNTER — Inpatient Hospital Stay: Payer: Self-pay

## 2022-03-08 ENCOUNTER — Inpatient Hospital Stay
Admission: EM | Admit: 2022-03-08 | Discharge: 2022-03-08 | DRG: 871 | Disposition: A | Discharge status: Other Acute Inpatient Hospital | Payer: Medicaid Other | Source: Skilled Nursing Facility | Attending: Internal Medicine | Admitting: Internal Medicine

## 2022-03-08 ENCOUNTER — Inpatient Hospital Stay (HOSPITAL_COMMUNITY)
Admission: EM | Admit: 2022-03-08 | Discharge: 2022-03-13 | DRG: 100 | Disposition: A | Discharge status: Other Acute Inpatient Hospital | Payer: Medicaid Other | Source: Other Acute Inpatient Hospital | Attending: Family Medicine | Admitting: Family Medicine

## 2022-03-08 DIAGNOSIS — I248 Other forms of acute ischemic heart disease: Secondary | ICD-10-CM | POA: Diagnosis present

## 2022-03-08 DIAGNOSIS — Z6837 Body mass index (BMI) 37.0-37.9, adult: Secondary | ICD-10-CM

## 2022-03-08 DIAGNOSIS — I69354 Hemiplegia and hemiparesis following cerebral infarction affecting left non-dominant side: Secondary | ICD-10-CM | POA: Diagnosis not present

## 2022-03-08 DIAGNOSIS — Z825 Family history of asthma and other chronic lower respiratory diseases: Secondary | ICD-10-CM

## 2022-03-08 DIAGNOSIS — N179 Acute kidney failure, unspecified: Secondary | ICD-10-CM | POA: Diagnosis present

## 2022-03-08 DIAGNOSIS — I5042 Chronic combined systolic (congestive) and diastolic (congestive) heart failure: Secondary | ICD-10-CM | POA: Diagnosis present

## 2022-03-08 DIAGNOSIS — R1312 Dysphagia, oropharyngeal phase: Secondary | ICD-10-CM | POA: Diagnosis present

## 2022-03-08 DIAGNOSIS — J9601 Acute respiratory failure with hypoxia: Secondary | ICD-10-CM | POA: Diagnosis present

## 2022-03-08 DIAGNOSIS — J690 Pneumonitis due to inhalation of food and vomit: Secondary | ICD-10-CM | POA: Diagnosis present

## 2022-03-08 DIAGNOSIS — Z87891 Personal history of nicotine dependence: Secondary | ICD-10-CM | POA: Diagnosis not present

## 2022-03-08 DIAGNOSIS — R569 Unspecified convulsions: Secondary | ICD-10-CM | POA: Diagnosis not present

## 2022-03-08 DIAGNOSIS — N1831 Chronic kidney disease, stage 3a: Secondary | ICD-10-CM | POA: Diagnosis present

## 2022-03-08 DIAGNOSIS — Z818 Family history of other mental and behavioral disorders: Secondary | ICD-10-CM

## 2022-03-08 DIAGNOSIS — D649 Anemia, unspecified: Secondary | ICD-10-CM | POA: Diagnosis present

## 2022-03-08 DIAGNOSIS — G40901 Epilepsy, unspecified, not intractable, with status epilepticus: Secondary | ICD-10-CM | POA: Diagnosis present

## 2022-03-08 DIAGNOSIS — Z841 Family history of disorders of kidney and ureter: Secondary | ICD-10-CM

## 2022-03-08 DIAGNOSIS — J449 Chronic obstructive pulmonary disease, unspecified: Secondary | ICD-10-CM | POA: Diagnosis present

## 2022-03-08 DIAGNOSIS — E872 Acidosis, unspecified: Secondary | ICD-10-CM | POA: Diagnosis present

## 2022-03-08 DIAGNOSIS — I5032 Chronic diastolic (congestive) heart failure: Secondary | ICD-10-CM | POA: Diagnosis present

## 2022-03-08 DIAGNOSIS — Z823 Family history of stroke: Secondary | ICD-10-CM | POA: Diagnosis not present

## 2022-03-08 DIAGNOSIS — N183 Chronic kidney disease, stage 3 unspecified: Secondary | ICD-10-CM | POA: Diagnosis present

## 2022-03-08 DIAGNOSIS — I2729 Other secondary pulmonary hypertension: Secondary | ICD-10-CM | POA: Diagnosis present

## 2022-03-08 DIAGNOSIS — Z7984 Long term (current) use of oral hypoglycemic drugs: Secondary | ICD-10-CM

## 2022-03-08 DIAGNOSIS — Z833 Family history of diabetes mellitus: Secondary | ICD-10-CM

## 2022-03-08 DIAGNOSIS — J9602 Acute respiratory failure with hypercapnia: Secondary | ICD-10-CM | POA: Diagnosis present

## 2022-03-08 DIAGNOSIS — Z811 Family history of alcohol abuse and dependence: Secondary | ICD-10-CM

## 2022-03-08 DIAGNOSIS — A419 Sepsis, unspecified organism: Secondary | ICD-10-CM | POA: Diagnosis not present

## 2022-03-08 DIAGNOSIS — I447 Left bundle-branch block, unspecified: Secondary | ICD-10-CM | POA: Diagnosis not present

## 2022-03-08 DIAGNOSIS — E785 Hyperlipidemia, unspecified: Secondary | ICD-10-CM | POA: Diagnosis present

## 2022-03-08 DIAGNOSIS — G40919 Epilepsy, unspecified, intractable, without status epilepticus: Secondary | ICD-10-CM | POA: Diagnosis present

## 2022-03-08 DIAGNOSIS — I10 Essential (primary) hypertension: Secondary | ICD-10-CM | POA: Diagnosis not present

## 2022-03-08 DIAGNOSIS — E119 Type 2 diabetes mellitus without complications: Secondary | ICD-10-CM | POA: Diagnosis not present

## 2022-03-08 DIAGNOSIS — Z20822 Contact with and (suspected) exposure to covid-19: Secondary | ICD-10-CM | POA: Diagnosis present

## 2022-03-08 DIAGNOSIS — I21A1 Myocardial infarction type 2: Secondary | ICD-10-CM | POA: Diagnosis present

## 2022-03-08 DIAGNOSIS — T426X5A Adverse effect of other antiepileptic and sedative-hypnotic drugs, initial encounter: Secondary | ICD-10-CM | POA: Diagnosis present

## 2022-03-08 DIAGNOSIS — I130 Hypertensive heart and chronic kidney disease with heart failure and stage 1 through stage 4 chronic kidney disease, or unspecified chronic kidney disease: Secondary | ICD-10-CM | POA: Diagnosis present

## 2022-03-08 DIAGNOSIS — I872 Venous insufficiency (chronic) (peripheral): Secondary | ICD-10-CM | POA: Diagnosis present

## 2022-03-08 DIAGNOSIS — Z8674 Personal history of sudden cardiac arrest: Secondary | ICD-10-CM

## 2022-03-08 DIAGNOSIS — E1122 Type 2 diabetes mellitus with diabetic chronic kidney disease: Secondary | ICD-10-CM | POA: Diagnosis present

## 2022-03-08 DIAGNOSIS — R0602 Shortness of breath: Secondary | ICD-10-CM | POA: Diagnosis not present

## 2022-03-08 DIAGNOSIS — G4730 Sleep apnea, unspecified: Secondary | ICD-10-CM | POA: Diagnosis present

## 2022-03-08 DIAGNOSIS — Z8249 Family history of ischemic heart disease and other diseases of the circulatory system: Secondary | ICD-10-CM | POA: Diagnosis not present

## 2022-03-08 DIAGNOSIS — D696 Thrombocytopenia, unspecified: Secondary | ICD-10-CM | POA: Diagnosis present

## 2022-03-08 DIAGNOSIS — I2510 Atherosclerotic heart disease of native coronary artery without angina pectoris: Secondary | ICD-10-CM | POA: Diagnosis present

## 2022-03-08 DIAGNOSIS — I952 Hypotension due to drugs: Secondary | ICD-10-CM | POA: Diagnosis present

## 2022-03-08 LAB — PROTIME-INR
INR: 1.1 (ref 0.8–1.2)
Prothrombin Time: 14.3 seconds (ref 11.4–15.2)

## 2022-03-08 LAB — URINE DRUG SCREEN, QUALITATIVE (ARMC ONLY)
Amphetamines, Ur Screen: NOT DETECTED
Barbiturates, Ur Screen: NOT DETECTED
Benzodiazepine, Ur Scrn: NOT DETECTED
Cannabinoid 50 Ng, Ur ~~LOC~~: NOT DETECTED
Cocaine Metabolite,Ur ~~LOC~~: NOT DETECTED
MDMA (Ecstasy)Ur Screen: NOT DETECTED
Methadone Scn, Ur: NOT DETECTED
Opiate, Ur Screen: NOT DETECTED
Phencyclidine (PCP) Ur S: NOT DETECTED
Tricyclic, Ur Screen: NOT DETECTED

## 2022-03-08 LAB — BLOOD GAS, ARTERIAL
Acid-base deficit: 0.2 mmol/L (ref 0.0–2.0)
Bicarbonate: 24.8 mmol/L (ref 20.0–28.0)
FIO2: 35 %
MECHVT: 500 mL
O2 Saturation: 97.7 %
PEEP: 5 cmH2O
Patient temperature: 37
RATE: 16 resp/min
pCO2 arterial: 41 mmHg (ref 32–48)
pH, Arterial: 7.39 (ref 7.35–7.45)
pO2, Arterial: 85 mmHg (ref 83–108)

## 2022-03-08 LAB — CBC WITH DIFFERENTIAL/PLATELET
Abs Immature Granulocytes: 0.02 10*3/uL (ref 0.00–0.07)
Abs Immature Granulocytes: 0.02 10*3/uL (ref 0.00–0.07)
Basophils Absolute: 0 10*3/uL (ref 0.0–0.1)
Basophils Absolute: 0 10*3/uL (ref 0.0–0.1)
Basophils Relative: 0 %
Basophils Relative: 0 %
Eosinophils Absolute: 0.5 10*3/uL (ref 0.0–0.5)
Eosinophils Absolute: 0.2 10*3/uL (ref 0.0–0.5)
Eosinophils Relative: 6 %
Eosinophils Relative: 3 %
HCT: 38.3 % — ABNORMAL LOW (ref 39.0–52.0)
HCT: 32.5 % — ABNORMAL LOW (ref 39.0–52.0)
Hemoglobin: 11.8 g/dL — ABNORMAL LOW (ref 13.0–17.0)
Hemoglobin: 10.1 g/dL — ABNORMAL LOW (ref 13.0–17.0)
Immature Granulocytes: 0 %
Immature Granulocytes: 0 %
Lymphocytes Relative: 20 %
Lymphocytes Relative: 15 %
Lymphs Abs: 1.5 10*3/uL (ref 0.7–4.0)
Lymphs Abs: 1.1 10*3/uL (ref 0.7–4.0)
MCH: 25.2 pg — ABNORMAL LOW (ref 26.0–34.0)
MCH: 25.4 pg — ABNORMAL LOW (ref 26.0–34.0)
MCHC: 30.8 g/dL (ref 30.0–36.0)
MCHC: 31.1 g/dL (ref 30.0–36.0)
MCV: 81.7 fL (ref 80.0–100.0)
MCV: 81.7 fL (ref 80.0–100.0)
Monocytes Absolute: 0.7 10*3/uL (ref 0.1–1.0)
Monocytes Absolute: 0.6 10*3/uL (ref 0.1–1.0)
Monocytes Relative: 9 %
Monocytes Relative: 8 %
Neutro Abs: 4.8 10*3/uL (ref 1.7–7.7)
Neutro Abs: 5.3 10*3/uL (ref 1.7–7.7)
Neutrophils Relative %: 65 %
Neutrophils Relative %: 74 %
Platelets: 199 10*3/uL (ref 150–400)
Platelets: 119 10*3/uL — ABNORMAL LOW (ref 150–400)
RBC: 4.69 MIL/uL (ref 4.22–5.81)
RBC: 3.98 MIL/uL — ABNORMAL LOW (ref 4.22–5.81)
RDW: 14.3 % (ref 11.5–15.5)
RDW: 14.3 % (ref 11.5–15.5)
WBC: 7.5 10*3/uL (ref 4.0–10.5)
WBC: 7.2 10*3/uL (ref 4.0–10.5)
nRBC: 0 % (ref 0.0–0.2)
nRBC: 0 % (ref 0.0–0.2)

## 2022-03-08 LAB — GLUCOSE, CAPILLARY
Glucose-Capillary: 73 mg/dL (ref 70–99)
Glucose-Capillary: 71 mg/dL (ref 70–99)
Glucose-Capillary: 96 mg/dL (ref 70–99)
Glucose-Capillary: 122 mg/dL — ABNORMAL HIGH (ref 70–99)
Glucose-Capillary: 145 mg/dL — ABNORMAL HIGH (ref 70–99)

## 2022-03-08 LAB — HIV ANTIBODY (ROUTINE TESTING W REFLEX): HIV Screen 4th Generation wRfx: NONREACTIVE

## 2022-03-08 LAB — LACTIC ACID, PLASMA
Lactic Acid, Venous: 5 mmol/L (ref 0.5–1.9)
Lactic Acid, Venous: >9 mmol/L (ref 0.5–1.9)
Lactic Acid, Venous: 3.5 mmol/L (ref 0.5–1.9)
Lactic Acid, Venous: 2 mmol/L (ref 0.5–1.9)

## 2022-03-08 LAB — URINALYSIS, COMPLETE (UACMP) WITH MICROSCOPIC
Bilirubin Urine: NEGATIVE
Glucose, UA: NEGATIVE mg/dL
Hgb urine dipstick: NEGATIVE
Ketones, ur: NEGATIVE mg/dL
Leukocytes,Ua: NEGATIVE
Nitrite: NEGATIVE
Protein, ur: 100 mg/dL — AB
Specific Gravity, Urine: 1.011 (ref 1.005–1.030)
pH: 6 (ref 5.0–8.0)

## 2022-03-08 LAB — COMPREHENSIVE METABOLIC PANEL
ALT: 32 U/L (ref 0–44)
ALT: 28 U/L (ref 0–44)
AST: 35 U/L (ref 15–41)
AST: 31 U/L (ref 15–41)
Albumin: 3.7 g/dL (ref 3.5–5.0)
Albumin: 2.8 g/dL — ABNORMAL LOW (ref 3.5–5.0)
Alkaline Phosphatase: 86 U/L (ref 38–126)
Alkaline Phosphatase: 72 U/L (ref 38–126)
Anion gap: 9 (ref 5–15)
Anion gap: 8 (ref 5–15)
BUN: 31 mg/dL — ABNORMAL HIGH (ref 8–23)
BUN: 24 mg/dL — ABNORMAL HIGH (ref 8–23)
CO2: 21 mmol/L — ABNORMAL LOW (ref 22–32)
CO2: 23 mmol/L (ref 22–32)
Calcium: 8.6 mg/dL — ABNORMAL LOW (ref 8.9–10.3)
Calcium: 8.3 mg/dL — ABNORMAL LOW (ref 8.9–10.3)
Chloride: 107 mmol/L (ref 98–111)
Chloride: 107 mmol/L (ref 98–111)
Creatinine, Ser: 1.69 mg/dL — ABNORMAL HIGH (ref 0.61–1.24)
Creatinine, Ser: 1.53 mg/dL — ABNORMAL HIGH (ref 0.61–1.24)
GFR, Estimated: 45 mL/min — ABNORMAL LOW (ref >60)
GFR, Estimated: 51 mL/min — ABNORMAL LOW (ref >60)
Glucose, Bld: 131 mg/dL — ABNORMAL HIGH (ref 70–99)
Glucose, Bld: 163 mg/dL — ABNORMAL HIGH (ref 70–99)
Potassium: 4.3 mmol/L (ref 3.5–5.1)
Potassium: 3.8 mmol/L (ref 3.5–5.1)
Sodium: 137 mmol/L (ref 135–145)
Sodium: 138 mmol/L (ref 135–145)
Total Bilirubin: 0.5 mg/dL (ref 0.3–1.2)
Total Bilirubin: 0.6 mg/dL (ref 0.3–1.2)
Total Protein: 7.5 g/dL (ref 6.5–8.1)
Total Protein: 6.3 g/dL — ABNORMAL LOW (ref 6.5–8.1)

## 2022-03-08 LAB — TROPONIN I (HIGH SENSITIVITY)
Troponin I (High Sensitivity): 130 ng/L (ref <18)
Troponin I (High Sensitivity): 114 ng/L (ref <18)
Troponin I (High Sensitivity): 806 ng/L (ref <18)
Troponin I (High Sensitivity): 749 ng/L (ref <18)
Troponin I (High Sensitivity): 589 ng/L (ref <18)
Troponin I (High Sensitivity): 351 ng/L (ref <18)
Troponin I (High Sensitivity): 346 ng/L (ref <18)

## 2022-03-08 LAB — MAGNESIUM
Magnesium: 2 mg/dL (ref 1.7–2.4)
Magnesium: 1.7 mg/dL (ref 1.7–2.4)

## 2022-03-08 LAB — POCT I-STAT 7, (LYTES, BLD GAS, ICA,H+H)
Acid-base deficit: 2 mmol/L (ref 0.0–2.0)
Bicarbonate: 23.1 mmol/L (ref 20.0–28.0)
Calcium, Ion: 1.21 mmol/L (ref 1.15–1.40)
HCT: 35 % — ABNORMAL LOW (ref 39.0–52.0)
Hemoglobin: 11.9 g/dL — ABNORMAL LOW (ref 13.0–17.0)
O2 Saturation: 98 %
Patient temperature: 97.8
Potassium: 3.8 mmol/L (ref 3.5–5.1)
Sodium: 137 mmol/L (ref 135–145)
TCO2: 24 mmol/L (ref 22–32)
pCO2 arterial: 39.6 mmHg (ref 32–48)
pH, Arterial: 7.371 (ref 7.35–7.45)
pO2, Arterial: 115 mmHg — ABNORMAL HIGH (ref 83–108)

## 2022-03-08 LAB — BASIC METABOLIC PANEL
Anion gap: 3 — ABNORMAL LOW (ref 5–15)
Anion gap: 2 — ABNORMAL LOW (ref 5–15)
BUN: 27 mg/dL — ABNORMAL HIGH (ref 8–23)
BUN: 29 mg/dL — ABNORMAL HIGH (ref 8–23)
CO2: 24 mmol/L (ref 22–32)
CO2: 25 mmol/L (ref 22–32)
Calcium: 7.7 mg/dL — ABNORMAL LOW (ref 8.9–10.3)
Calcium: 7.9 mg/dL — ABNORMAL LOW (ref 8.9–10.3)
Chloride: 111 mmol/L (ref 98–111)
Chloride: 112 mmol/L — ABNORMAL HIGH (ref 98–111)
Creatinine, Ser: 1.42 mg/dL — ABNORMAL HIGH (ref 0.61–1.24)
Creatinine, Ser: 1.38 mg/dL — ABNORMAL HIGH (ref 0.61–1.24)
GFR, Estimated: 56 mL/min — ABNORMAL LOW (ref >60)
GFR, Estimated: 58 mL/min — ABNORMAL LOW (ref >60)
Glucose, Bld: 86 mg/dL (ref 70–99)
Glucose, Bld: 88 mg/dL (ref 70–99)
Potassium: 4.4 mmol/L (ref 3.5–5.1)
Potassium: 4.1 mmol/L (ref 3.5–5.1)
Sodium: 138 mmol/L (ref 135–145)
Sodium: 139 mmol/L (ref 135–145)

## 2022-03-08 LAB — CBC
HCT: 36.3 % — ABNORMAL LOW (ref 39.0–52.0)
Hemoglobin: 11.3 g/dL — ABNORMAL LOW (ref 13.0–17.0)
MCH: 25.5 pg — ABNORMAL LOW (ref 26.0–34.0)
MCHC: 31.1 g/dL (ref 30.0–36.0)
MCV: 81.8 fL (ref 80.0–100.0)
Platelets: 185 10*3/uL (ref 150–400)
RBC: 4.44 MIL/uL (ref 4.22–5.81)
RDW: 14.3 % (ref 11.5–15.5)
WBC: 9.3 10*3/uL (ref 4.0–10.5)
nRBC: 0 % (ref 0.0–0.2)

## 2022-03-08 LAB — BRAIN NATRIURETIC PEPTIDE: B Natriuretic Peptide: 127 pg/mL — ABNORMAL HIGH (ref 0.0–100.0)

## 2022-03-08 LAB — MRSA NEXT GEN BY PCR, NASAL: MRSA by PCR Next Gen: NOT DETECTED

## 2022-03-08 LAB — APTT: aPTT: 30 seconds (ref 24–36)

## 2022-03-08 LAB — RESP PANEL BY RT-PCR (FLU A&B, COVID) ARPGX2
Influenza A by PCR: NEGATIVE
Influenza B by PCR: NEGATIVE
SARS Coronavirus 2 by RT PCR: NEGATIVE

## 2022-03-08 LAB — PHOSPHORUS: Phosphorus: 3.2 mg/dL (ref 2.5–4.6)

## 2022-03-08 LAB — CBG MONITORING, ED: Glucose-Capillary: 127 mg/dL — ABNORMAL HIGH (ref 70–99)

## 2022-03-08 LAB — PROCALCITONIN: Procalcitonin: <0.1 ng/mL

## 2022-03-08 MED ORDER — PROPOFOL 1000 MG/100ML IV EMUL
5.0000 ug/kg/min | INTRAVENOUS | Status: DC
  Administered 2022-03-08: 5 ug/kg/min via INTRAVENOUS

## 2022-03-08 MED ORDER — ETOMIDATE 2 MG/ML IV SOLN
INTRAVENOUS | Status: AC
  Filled 2022-03-08: qty 20

## 2022-03-08 MED ORDER — LACTATED RINGERS IV SOLN
INTRAVENOUS | Status: AC
Start: 2022-03-08 — End: 2022-03-08

## 2022-03-08 MED ORDER — NOREPINEPHRINE 4 MG/250ML-% IV SOLN
2.0000 ug/min | INTRAVENOUS | Status: DC
  Administered 2022-03-08 (×2): 2 ug/min via INTRAVENOUS
  Filled 2022-03-08: qty 250

## 2022-03-08 MED ORDER — INSULIN ASPART 100 UNIT/ML IJ SOLN
0.0000 [IU] | INTRAMUSCULAR | Status: DC
  Administered 2022-03-08 – 2022-03-09 (×3): 2 [IU] via SUBCUTANEOUS
  Administered 2022-03-09: 3 [IU] via SUBCUTANEOUS
  Administered 2022-03-11 – 2022-03-12 (×3): 2 [IU] via SUBCUTANEOUS

## 2022-03-08 MED ORDER — FENTANYL 2500MCG IN NS 250ML (10MCG/ML) PREMIX INFUSION: 25.0000 ug/h | INTRAVENOUS | Status: DC

## 2022-03-08 MED ORDER — LEVETIRACETAM IN NACL 1000 MG/100ML IV SOLN
INTRAVENOUS | Status: AC
  Filled 2022-03-08: qty 200

## 2022-03-08 MED ORDER — SODIUM CHLORIDE 0.9 % IV SOLN: 3.0000 g | Freq: Four times a day (QID) | INTRAVENOUS | Status: DC

## 2022-03-08 MED ORDER — DEXTROSE 50 % IV SOLN
INTRAVENOUS | Status: AC
  Filled 2022-03-08: qty 50

## 2022-03-08 MED ORDER — NOREPINEPHRINE 4 MG/250ML-% IV SOLN
0.0000 ug/min | INTRAVENOUS | Status: DC
  Administered 2022-03-08: 12 ug/min via INTRAVENOUS
  Administered 2022-03-09: 10 ug/min via INTRAVENOUS
  Administered 2022-03-09: 9 ug/min via INTRAVENOUS
  Filled 2022-03-08 (×3): qty 250

## 2022-03-08 MED ORDER — AMPICILLIN-SULBACTAM SODIUM 3 (2-1) G IJ SOLR
3.0000 g | Freq: Four times a day (QID) | INTRAMUSCULAR | Status: DC
  Filled 2022-03-08 (×2): qty 8

## 2022-03-08 MED ORDER — POLYETHYLENE GLYCOL 3350 17 G PO PACK: 17.0000 g | PACK | Freq: Every day | ORAL | Status: DC | PRN

## 2022-03-08 MED ORDER — PROPOFOL 1000 MG/100ML IV EMUL
INTRAVENOUS | Status: DC | PRN
  Administered 2022-03-08: 5 ug/kg/min via INTRAVENOUS

## 2022-03-08 MED ORDER — POLYETHYLENE GLYCOL 3350 17 G PO PACK: 17.0000 g | PACK | Freq: Every day | ORAL | 0 refills | Status: DC

## 2022-03-08 MED ORDER — ENOXAPARIN SODIUM 60 MG/0.6ML IJ SOSY: 0.5000 mg/kg | PREFILLED_SYRINGE | Freq: Every day | INTRAMUSCULAR | Status: DC

## 2022-03-08 MED ORDER — FENTANYL 2500MCG IN NS 250ML (10MCG/ML) PREMIX INFUSION
0.0000 ug/h | INTRAVENOUS | Status: DC
  Administered 2022-03-08: 150 ug/h via INTRAVENOUS
  Administered 2022-03-08: 50 ug/h via INTRAVENOUS
  Filled 2022-03-08 (×2): qty 250

## 2022-03-08 MED ORDER — ATROPINE SULFATE 1 MG/10ML IJ SOSY
PREFILLED_SYRINGE | INTRAMUSCULAR | Status: AC
  Filled 2022-03-08: qty 10

## 2022-03-08 MED ORDER — ORAL CARE MOUTH RINSE
15.0000 mL | OROMUCOSAL | Status: DC
  Administered 2022-03-08 – 2022-03-13 (×50): 15 mL via OROMUCOSAL

## 2022-03-08 MED ORDER — LEVETIRACETAM IN NACL 1500 MG/100ML IV SOLN
1500.0000 mg | Freq: Two times a day (BID) | INTRAVENOUS | Status: DC
  Administered 2022-03-08: 1500 mg via INTRAVENOUS
  Filled 2022-03-08: qty 100

## 2022-03-08 MED ORDER — ACETAMINOPHEN 650 MG RE SUPP: 650.0000 mg | Freq: Once | RECTAL | Status: DC

## 2022-03-08 MED ORDER — DEXTROSE 50 % IV SOLN
25.0000 mL | Freq: Once | INTRAVENOUS | Status: AC
  Administered 2022-03-08: 25 mL via INTRAVENOUS

## 2022-03-08 MED ORDER — LEVETIRACETAM IN NACL 1000 MG/100ML IV SOLN
1000.0000 mg | Freq: Once | INTRAVENOUS | Status: AC
  Administered 2022-03-08: 1000 mg via INTRAVENOUS

## 2022-03-08 MED ORDER — ORAL CARE MOUTH RINSE
15.0000 mL | OROMUCOSAL | Status: DC | PRN
Start: 2022-03-08 — End: 2022-03-13

## 2022-03-08 MED ORDER — POLYETHYLENE GLYCOL 3350 17 G PO PACK
17.0000 g | PACK | Freq: Every day | ORAL | Status: DC
  Administered 2022-03-09 – 2022-03-10 (×2): 17 g
  Filled 2022-03-08 (×3): qty 1

## 2022-03-08 MED ORDER — ENOXAPARIN SODIUM 60 MG/0.6ML IJ SOSY
0.5000 mg/kg | PREFILLED_SYRINGE | Freq: Every day | INTRAMUSCULAR | Status: DC
Start: 2022-03-08 — End: 2022-03-08
  Administered 2022-03-08: 55 mg via SUBCUTANEOUS
  Filled 2022-03-08: qty 0.55

## 2022-03-08 MED ORDER — MAGNESIUM SULFATE IN D5W 1-5 GM/100ML-% IV SOLN
1.0000 g | Freq: Once | INTRAVENOUS | Status: AC
  Administered 2022-03-08: 1 g via INTRAVENOUS
  Filled 2022-03-08: qty 100

## 2022-03-08 MED ORDER — SODIUM CHLORIDE 0.9 % IV SOLN
250.0000 mL | INTRAVENOUS | Status: DC
  Administered 2022-03-08: 250 mL via INTRAVENOUS

## 2022-03-08 MED ORDER — LACTATED RINGERS IV BOLUS (SEPSIS)
1000.0000 mL | Freq: Once | INTRAVENOUS | Status: AC
  Administered 2022-03-08: 1000 mL via INTRAVENOUS

## 2022-03-08 MED ORDER — METRONIDAZOLE 500 MG/100ML IV SOLN
500.0000 mg | Freq: Three times a day (TID) | INTRAVENOUS | Status: DC
  Administered 2022-03-08: 500 mg via INTRAVENOUS
  Filled 2022-03-08: qty 100

## 2022-03-08 MED ORDER — ETOMIDATE 2 MG/ML IV SOLN
INTRAVENOUS | Status: DC | PRN
  Administered 2022-03-08: 30 mg via INTRAVENOUS

## 2022-03-08 MED ORDER — LACTATED RINGERS IV SOLN: 75.0000 mL | INTRAVENOUS | 0 refills | Status: DC

## 2022-03-08 MED ORDER — SUCCINYLCHOLINE CHLORIDE 20 MG/ML IJ SOLN
INTRAMUSCULAR | Status: DC | PRN
  Administered 2022-03-08: 120 mg via INTRAVENOUS

## 2022-03-08 MED ORDER — MIDAZOLAM BOLUS VIA INFUSION: 0.0000 mg | INTRAVENOUS | Status: DC | PRN

## 2022-03-08 MED ORDER — ATROPINE SULFATE 1 MG/ML IV SOLN: 1.0000 mg | Freq: Once | INTRAVENOUS | Status: AC

## 2022-03-08 MED ORDER — SODIUM CHLORIDE 0.9 % IV SOLN
3.0000 g | Freq: Four times a day (QID) | INTRAVENOUS | Status: AC
  Administered 2022-03-08 – 2022-03-11 (×12): 3 g via INTRAVENOUS
  Filled 2022-03-08 (×12): qty 8

## 2022-03-08 MED ORDER — DOCUSATE SODIUM 50 MG/5ML PO LIQD: 100.0000 mg | Freq: Two times a day (BID) | ORAL | Status: DC | PRN

## 2022-03-08 MED ORDER — DOCUSATE SODIUM 50 MG/5ML PO LIQD
100.0000 mg | Freq: Two times a day (BID) | ORAL | Status: DC
  Administered 2022-03-08 – 2022-03-09 (×3): 100 mg
  Filled 2022-03-08 (×3): qty 10

## 2022-03-08 MED ORDER — MIDAZOLAM-SODIUM CHLORIDE 100-0.9 MG/100ML-% IV SOLN
0.5000 mg/h | INTRAVENOUS | Status: DC
  Administered 2022-03-08: 7 mg/h via INTRAVENOUS
  Administered 2022-03-08: 4 mg/h via INTRAVENOUS
  Filled 2022-03-08 (×2): qty 100

## 2022-03-08 MED ORDER — POLYETHYLENE GLYCOL 3350 17 G PO PACK
17.0000 g | PACK | Freq: Every day | ORAL | Status: DC
Start: 2022-03-08 — End: 2022-03-08
  Administered 2022-03-08: 17 g
  Filled 2022-03-08: qty 1

## 2022-03-08 MED ORDER — SODIUM CHLORIDE 0.9 % IV SOLN
2.0000 g | Freq: Once | INTRAVENOUS | Status: AC
  Administered 2022-03-08: 2 g via INTRAVENOUS

## 2022-03-08 MED ORDER — LEVETIRACETAM IN NACL 1500 MG/100ML IV SOLN: 1500.0000 mg | Freq: Two times a day (BID) | INTRAVENOUS | Status: DC

## 2022-03-08 MED ORDER — VANCOMYCIN HCL IN DEXTROSE 1-5 GM/200ML-% IV SOLN: 1000.0000 mg | Freq: Once | INTRAVENOUS | Status: DC

## 2022-03-08 MED ORDER — PANTOPRAZOLE 2 MG/ML SUSPENSION
40.0000 mg | Freq: Every day | ORAL | Status: DC
Start: 2022-03-08 — End: 2022-03-08
  Administered 2022-03-08: 40 mg
  Filled 2022-03-08 (×2): qty 20

## 2022-03-08 MED ORDER — LORAZEPAM 2 MG/ML IJ SOLN
INTRAMUSCULAR | Status: AC
  Administered 2022-03-08: 2 mg
  Filled 2022-03-08: qty 2

## 2022-03-08 MED ORDER — DOCUSATE SODIUM 50 MG/5ML PO LIQD
100.0000 mg | Freq: Two times a day (BID) | ORAL | 0 refills | Status: DC | PRN
Start: 2022-03-08 — End: 2022-03-09

## 2022-03-08 MED ORDER — PROPOFOL 1000 MG/100ML IV EMUL
INTRAVENOUS | Status: AC
  Filled 2022-03-08: qty 100

## 2022-03-08 MED ORDER — SODIUM CHLORIDE 0.9 % IV SOLN: 250.0000 mL | INTRAVENOUS | 0 refills | Status: DC

## 2022-03-08 MED ORDER — SUCCINYLCHOLINE CHLORIDE 200 MG/10ML IV SOSY
PREFILLED_SYRINGE | INTRAVENOUS | Status: AC
  Filled 2022-03-08: qty 10

## 2022-03-08 MED ORDER — ASPIRIN 300 MG RE SUPP
300.0000 mg | Freq: Once | RECTAL | Status: AC
  Administered 2022-03-08: 300 mg via RECTAL
  Filled 2022-03-08: qty 1

## 2022-03-08 MED ORDER — NOREPINEPHRINE 4 MG/250ML-% IV SOLN: 2.0000 ug/min | INTRAVENOUS | Status: DC

## 2022-03-08 MED ORDER — INSULIN ASPART 100 UNIT/ML IJ SOLN: 0.0000 [IU] | INTRAMUSCULAR | 11 refills | Status: DC

## 2022-03-08 MED ORDER — PROPOFOL 1000 MG/100ML IV EMUL: 5.0000 ug/kg/min | INTRAVENOUS | Status: DC

## 2022-03-08 MED ORDER — SODIUM CHLORIDE 0.9 % IV SOLN: 2.0000 g | Freq: Two times a day (BID) | INTRAVENOUS | Status: DC

## 2022-03-08 MED ORDER — MIDAZOLAM-SODIUM CHLORIDE 100-0.9 MG/100ML-% IV SOLN
0.0000 mg/h | INTRAVENOUS | Status: DC
  Administered 2022-03-09: 6 mg/h via INTRAVENOUS
  Filled 2022-03-08: qty 100

## 2022-03-08 MED ORDER — VANCOMYCIN HCL 2000 MG/400ML IV SOLN
2000.0000 mg | Freq: Once | INTRAVENOUS | Status: AC
  Administered 2022-03-08: 2000 mg via INTRAVENOUS
  Filled 2022-03-08: qty 400

## 2022-03-08 MED ORDER — PANTOPRAZOLE 2 MG/ML SUSPENSION
40.0000 mg | Freq: Every day | ORAL | Status: DC
  Administered 2022-03-09 – 2022-03-10 (×2): 40 mg
  Filled 2022-03-08 (×3): qty 20

## 2022-03-08 MED ORDER — MIDAZOLAM-SODIUM CHLORIDE 100-0.9 MG/100ML-% IV SOLN: 0.5000 mg/h | INTRAVENOUS | Status: DC

## 2022-03-08 MED ORDER — METRONIDAZOLE 500 MG/100ML IV SOLN
500.0000 mg | Freq: Once | INTRAVENOUS | Status: DC
  Filled 2022-03-08: qty 100

## 2022-03-08 MED ORDER — SODIUM CHLORIDE 0.9 % IV SOLN: 2000.0000 mg | Freq: Once | INTRAVENOUS | Status: DC

## 2022-03-08 MED ORDER — DOCUSATE SODIUM 50 MG/5ML PO LIQD: 100.0000 mg | Freq: Two times a day (BID) | ORAL | 0 refills | Status: DC

## 2022-03-08 MED ORDER — FENTANYL CITRATE PF 50 MCG/ML IJ SOSY: 25.0000 ug | PREFILLED_SYRINGE | Freq: Once | INTRAMUSCULAR | Status: DC

## 2022-03-08 MED ORDER — ATROPINE SULFATE 1 MG/10ML IJ SOSY
PREFILLED_SYRINGE | INTRAMUSCULAR | Status: AC
  Administered 2022-03-08: 1 mg
  Filled 2022-03-08: qty 10

## 2022-03-08 MED ORDER — LEVETIRACETAM IN NACL 1500 MG/100ML IV SOLN
1500.0000 mg | Freq: Two times a day (BID) | INTRAVENOUS | Status: DC
  Administered 2022-03-08 – 2022-03-10 (×4): 1500 mg via INTRAVENOUS
  Filled 2022-03-08 (×4): qty 100

## 2022-03-08 MED ORDER — LACTATED RINGERS IV BOLUS (SEPSIS)
1000.0000 mL | Freq: Once | INTRAVENOUS | Status: AC
Start: 2022-03-08 — End: 2022-03-08
  Administered 2022-03-08: 1000 mL via INTRAVENOUS

## 2022-03-08 MED ORDER — FENTANYL BOLUS VIA INFUSION: 25.0000 ug | INTRAVENOUS | Status: DC | PRN

## 2022-03-08 MED ORDER — DOCUSATE SODIUM 100 MG PO CAPS: 100.0000 mg | ORAL_CAPSULE | Freq: Two times a day (BID) | ORAL | Status: DC | PRN

## 2022-03-08 MED ORDER — POLYETHYLENE GLYCOL 3350 17 G PO PACK: 17.0000 g | PACK | Freq: Every day | ORAL | 0 refills | Status: DC | PRN

## 2022-03-08 MED ORDER — DOCUSATE SODIUM 50 MG/5ML PO LIQD
100.0000 mg | Freq: Two times a day (BID) | ORAL | Status: DC
Start: 2022-03-08 — End: 2022-03-08
  Administered 2022-03-08: 100 mg
  Filled 2022-03-08 (×2): qty 10

## 2022-03-08 MED ORDER — ONDANSETRON HCL 4 MG/2ML IJ SOLN
INTRAMUSCULAR | Status: AC
  Administered 2022-03-08: 4 mg
  Filled 2022-03-08: qty 2

## 2022-03-08 MED ORDER — INSULIN ASPART 100 UNIT/ML IJ SOLN: 0.0000 [IU] | INTRAMUSCULAR | Status: DC

## 2022-03-08 MED ORDER — LACTATED RINGERS IV SOLN: INTRAVENOUS | Status: DC

## 2022-03-08 MED ORDER — HEPARIN SODIUM (PORCINE) 5000 UNIT/ML IJ SOLN
5000.0000 [IU] | Freq: Three times a day (TID) | INTRAMUSCULAR | Status: DC
Start: 2022-03-08 — End: 2022-03-13
  Administered 2022-03-08 – 2022-03-13 (×15): 5000 [IU] via SUBCUTANEOUS
  Filled 2022-03-08 (×15): qty 1

## 2022-03-08 MED ORDER — FENTANYL 2500MCG IN NS 250ML (10MCG/ML) PREMIX INFUSION
0.0000 ug/h | INTRAVENOUS | 0 refills | Status: DC
Start: 2022-03-08 — End: 2022-03-13

## 2022-03-08 MED ORDER — CHLORHEXIDINE GLUCONATE CLOTH 2 % EX PADS
6.0000 | MEDICATED_PAD | Freq: Every day | CUTANEOUS | Status: DC
  Administered 2022-03-09 – 2022-03-13 (×5): 6 via TOPICAL

## 2022-03-08 NOTE — ED Notes (Signed)
Called carelink spoke to [facility] patient still waiting bed assignment [id number]

## 2022-03-08 NOTE — ED Notes (Signed)
Pt's versed titrated due to HR decrease. Pt became brady and Versed was decreased. Pt began to lift right arm, so was titrated up. Pt appears to be resting comfortably at this time and has a HR of 56 bpm at this time.

## 2022-03-08 NOTE — Procedures (Signed)
Central Venous Catheter Insertion Procedure Note Jeremy Browning [id number] [birth year]  Procedure: Insertion of Central Venous Catheter Indications: Assessment of intravascular volume, Drug and/or fluid administration, and Frequent blood sampling  Procedure Details Consent: Unable to obtain consent because of emergent medical necessity. Time Out: Verified patient identification, verified procedure, site/side was marked, verified correct patient position, special equipment/implants available, medications/allergies/relevent history reviewed, required imaging and test results available.  Performed  Maximum sterile technique was used including antiseptics, cap, gloves, gown, hand hygiene, mask, and sheet. Skin prep: Chlorhexidine; local anesthetic administered A antimicrobial bonded/coated triple lumen catheter was placed in the left internal jugular vein using the Seldinger technique.  Evaluation Blood flow good Complications: No apparent complications Patient did tolerate procedure well. Chest X-ray ordered to verify placement.  CXR: pending.   Line secured at the 20 cm mark. BIOPATCH applied to the insertion site.  Harlon Ditty, AGACNP-BC [facility] Prefer epic messenger for cross cover needs If after hours, please call E-link  Judithe Modest 03/08/2022, 3:14 PM

## 2022-03-08 NOTE — Progress Notes (Signed)
Pt transferred via carelink to [facility] 4N, called report to [place] at [facility]. Pt sedated on vent when transferred alert to pain, on Levo, Fent, Versed and LR, LIJ Just placed by ICU NP, Atropine ordered prior to IJ insertion due to pt HR steadily declining 38-40. Positive response from atropine, rate increased to 80's, upon transfer pt HR was in the 60's. Appropriate paperwork sent with carelink, No personal items were brought up from the ED with patient upon admission.

## 2022-03-08 NOTE — H&P (Signed)
NAME:  Jeremy Browning, MRN:  [id number], DOB:  01-27-1961, LOS: 0 ADMISSION DATE:  03/08/2022, CONSULTATION DATE:  03/08/2022 REFERRING MD:  Dr. Quinn Axe, CHIEF COMPLAINT:  Seizures   Brief Pt Description / Synopsis:  61 y.o. male admitted with acute metabolic encephalopathy and acute hypoxic respiratory failure in the setting of breakthrough seizures requiring intubation for airway protection.  Concern for possible aspiration.  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  Patient is currently intubated and sedated, no family is currently available, therefore history is obtained per chart review.  Per ED and nursing notes, the patient presented from his facility around midnight after seizing, staff reported it was generalized tonic-clonic seizure for approximately 20 minutes.  Upon EMS arrival he was given 5 mg of intranasal Versed with resolution of the seizure for several minutes, followed by another tonic-clonic seizure lasting about 45 seconds.  [facility] healthcare staff reported a recent therapeutic Keppra level, which she is on 1000 mg twice daily.  After arrival to the ED patient noted to be hypoxic with O2 saturations 81% on nasal cannula, along with rhonchorous breath sounds concerning for aspiration.  He was intubated by ED provider for airway protection and hypoxia.  ED Course: Initial Vital Signs: Temperature 99.3 F, blood pressure 133/97, pulse 102, respiratory 15, SPO2 100% via nasal cannula Significant Labs: Bicarbonate 21, glucose 131, BUN 31, creatinine 1.69, BNP 127, high-sensitivity troponin 130, lactic acid 5.0, procalcitonin negative, hemoglobin 11.8, hematocrit 38.3 ABG postintubation: pH  7.39/PCO2 41/PO2 85/bicarb 24.8 COVID-19 and influenza PCR negative Urinalysis negative for UTI Urine drug screen negative Imaging Chest X-ray>>IMPRESSION: 1. Endotracheal tube tip 3.4 cm from the carina. 2. Patchy airspace disease throughout the right lung greatest in the infrahilar region. Findings may represent pneumonia, aspiration or less likely asymmetric pulmonary edema. CT head>>IMPRESSION: No acute intracranial abnormality. Stable remote infarcts. Medications Administered: 2 g IV Keppra, 3 L LR boluses, cefepime and Flagyl IV, initiated on propofol and fentanyl infusions (later transitioned to Versed infusion)  Patient seen by neurology, who recommends ideally that patient be admitted to a hospital with continuous EEG capabilities.  However currently [facility], [facility], and [facility] are at capacity and unable to accept transfer.  Therefore PCCM is asked to admit for further work-up and treatment.  Please see "significant hospital events" section below for full detailed hospital course.   Pertinent  Medical History   Past Medical History:  Diagnosis Date   CHF (congestive heart failure) ([place])    Diabetes mellitus type II, non insulin dependent ([place])    Essential hypertension [name]   h/o Proteus pneumonia ([place]) 02/17/2021   Heart failure ([place])    Unknown details.  By report no cardiac surgery   Seizures ([place])    Severe hypoxic-ischemic encephalopathy    Sleep apnea    Stroke [facility])    Venous stasis dermatitis of left lower extremity     Micro Data:  7/19: SARS-CoV-2 and influenza PCR>> negative 7/19: Blood culture x2>> 7/19: Urine>> 7/19: Tracheal aspirate>>  Antimicrobials:  Cefepime 7/19>7/19 Flagyl 7/19>> 7/19 Vancomycin 7/19 x 1 dose Unasyn 7/19>>  Significant Hospital Events: Including procedures, antibiotic start and stop dates in addition to other pertinent events   7/19: Presented to ED with  breakthrough seizures requiring intubation for airway  protection.  Neurology recommends transfer to facility capable of continuous EEG, however all eligible facilities are at capacity and unable to assess for transfer.  PCCM asked to admit.  Interim History / Subjective:  -Patient seen and examined in ICU, currently undergoing EEG -Patient heavily sedated on fentanyl and Versed, per neurology do not wean Versed today -Blood pressure soft, Levophed if needed -Empirically treating for presumed aspiration   Objective   Blood pressure (!) 126/50, pulse (!) 52, temperature 98.7 F (37.1 C), resp. rate (!) 22, height 5' 7.99" (1.727 m), weight 108.1 kg, SpO2 96 %.    Vent Mode: AC FiO2 (%):  [35 %-50 %] 35 % Set Rate:  [16 bmp] 16 bmp Vt Set:  [500 mL] 500 mL PEEP:  [5 cmH20] 5 cmH20   Intake/Output Summary (Last 24 hours) at 03/08/2022 1037 Last data filed at 03/08/2022 0600 Gross per 24 hour  Intake 200.17 ml  Output 550 ml  Net -349.83 ml   Filed Weights   03/08/22 0102  Weight: 108.1 kg    Examination: General: Acute on chronically ill-appearing male, laying in bed, intubated and sedated, no acute distress HENT: Atraumatic, normocephalic, neck supple, no JVD, orally intubated Lungs: Coarse breath sounds bilaterally, synchronous with the vent, even, nonlabored Cardiovascular: Regular rate and rhythm, S1-S2, no murmurs, rubs, gallops Abdomen: Soft, nontender, nondistended, no guarding rebound tenderness, bowel sounds positive x4 Extremities: No deformities, 1+ edema bilateral lower extremities Neuro: Heavily sedated, no response to painful stimuli, pupils PERRLA (sluggish at 1 mm bilaterally) GU: Foley catheter in place draining yellow urine  Resolved Hospital Problem list     Assessment & Plan:   Acute Hypoxic Respiratory Failure in the setting of presumed Aspiration & Acute Encephalopathy and breakthrough seizures Intubated for airway protection PMHx: Sleep apnea -Full vent support, implement lung protective  strategies -Plateau pressures less than 30 cm H20 -Wean FiO2 & PEEP as tolerated to maintain O2 sats >92% -Follow intermittent Chest X-ray & ABG as needed -Spontaneous Breathing Trials when respiratory parameters met and mental status permits -Implement VAP Bundle -Prn Bronchodilators -Antibiotics as above  Breakthrough seizures Sedation needs in the setting of mechanical ventilation PMHx: Right MCA stroke with residual left-sided hemiparesis and contractures, seizures on Keppra -Maintain a RASS goal of -1 to -2 -Fentanyl and Versed drips as needed to maintain RASS goal (per neurology do not wean Versed drip on 7/19) -Avoid sedating medications as able -Daily wake up assessment -CT head negative -Urine drug screen negative -Seizure precautions -Neurology following, appreciate input ~spot EEGs as per neurology (ideally patient should be transferred to facility capable of continuous EEG when bed availability arises) -Continue Keppra 1500 mg twice daily -MRI brain pending -Aspiration coverage changed to Unasyn from cefepime due to potential for cefepime induced encephalopathy and rhythmic abnormalities on EEG  Hypotension: Sedation related vs developing sepsis Elevated troponin, suspect demand ischemia Chronic congestive heart failure with mildly reduced ejection fraction (LVEF 40 to 18%, grade 1 diastolic dysfunction), not acutely exacerbated PMHx: Hypertension, CAD, Hyperlipidemia, LBBB -Continuous cardiac monitoring -Maintain MAP >65 -Cautious IV fluids (received 3 L fluid resuscitation in ED) -Vasopressors as needed to maintain MAP goal -Trend lactic acid until normalized (5.0 ~ >9 ~ 3.5 ~ 2.0) -HS Troponin peaked at 806 -Consider repeat Echocardiogram (echocardiogram 11/07/2021: LVEF 40 to 29%, grade 1 diastolic dysfunction, RV function normal)  Concern for aspiration -Monitor fever curve -Trend WBC's & Procalcitonin -Follow cultures as above -Continue empiric Unasyn  pending  cultures & sensitivities  CKD Stage III Lactic acidosis, suspect due to seizure activity ~ IMPROVED -Monitor I&O's / urinary output -Follow BMP -Ensure adequate renal perfusion -Avoid nephrotoxic agents as able -Replace electrolytes as indicated -IV fluids -Trend lactic acid  Diabetes Mellitus Type II -CBG's q4h; Target range of 140 to 180 -SSI -Follow ICU Hypo/Hyperglycemia protocol      Best Practice (right click and "Reselect all SmartList Selections" daily)   Diet/type: NPO DVT prophylaxis: LMWH GI prophylaxis: PPI Lines: N/A Foley:  Yes, and it is still needed Code Status:  full code Last date of multidisciplinary goals of care discussion [n/a]  Will update family when they arrive at bedside.  Labs   CBC: Recent Labs  Lab 03/08/22 0053 03/08/22 0645  WBC 7.5 7.2  NEUTROABS 4.8 5.3  HGB 11.8* 10.1*  HCT 38.3* 32.5*  MCV 81.7 81.7  PLT 199 119*    Basic Metabolic Panel: Recent Labs  Lab 03/08/22 0053 03/08/22 0645  NA 137 139  138  K 4.3 4.1  4.4  CL 107 112*  111  CO2 21* 25  24  GLUCOSE 131* 88  86  BUN 31* 29*  27*  CREATININE 1.69* 1.38*  1.42*  CALCIUM 8.6* 7.9*  7.7*  MG 2.0  --    GFR: Estimated Creatinine Clearance: 64.3 mL/min (A) (by C-G formula based on SCr of 1.42 mg/dL (H)). Recent Labs  Lab 03/08/22 0053 03/08/22 0054 03/08/22 0236 03/08/22 0453 03/08/22 0645  PROCALCITON <0.10  --   --   --   --   WBC 7.5  --   --   --  7.2  LATICACIDVEN  --  5.0* >9.0* 3.5* 2.0*    Liver Function Tests: Recent Labs  Lab 03/08/22 0053  AST 35  ALT 32  ALKPHOS 86  BILITOT 0.5  PROT 7.5  ALBUMIN 3.7   No results for input(s): "LIPASE", "AMYLASE" in the last 168 hours. No results for input(s): "AMMONIA" in the last 168 hours.  ABG    Component Value Date/Time   PHART 7.39 03/08/2022 0207   PCO2ART 41 03/08/2022 0207   PO2ART 85 03/08/2022 0207   HCO3 24.8 03/08/2022 0207   ACIDBASEDEF 0.2 03/08/2022 0207   O2SAT  97.7 03/08/2022 0207     Coagulation Profile: Recent Labs  Lab 03/08/22 0055  INR 1.1    Cardiac Enzymes: No results for input(s): "CKTOTAL", "CKMB", "CKMBINDEX", "TROPONINI" in the last 168 hours.  HbA1C: Hgb A1c MFr Bld  Date/Time Value Ref Range Status  02/26/2021 06:46 PM 7.1 (H) 4.8 - 5.6 % Final    Comment:    (NOTE) Pre diabetes:          5.7%-6.4%  Diabetes:              >6.4%  Glycemic control for   <7.0% adults with diabetes   12/25/2020 04:46 PM 5.9 (H) 4.8 - 5.6 % Final    Comment:    (NOTE) Pre diabetes:          5.7%-6.4%  Diabetes:              >6.4%  Glycemic control for   <7.0% adults with diabetes     CBG: Recent Labs  Lab 03/08/22 0108  GLUCAP 127*    Review of Systems:   Unable to assess due to intubation/sedation/critical illness   Past Medical History:  He,  has a past medical history of CHF (congestive heart failure) ([place]),  Diabetes mellitus type II, non insulin dependent ([place]), Essential hypertension Raquel Sarna like), h/o Proteus pneumonia ([place]) (02/17/2021), Heart failure ([place]), Seizures ([place]), Severe hypoxic-ischemic encephalopathy, Sleep apnea, Stroke ([place]), and Venous stasis dermatitis of left lower extremity.   Surgical History:   Past Surgical History:  Procedure Laterality Date   HARDWARE REMOVAL Left 02/10/2022   Procedure: HARDWARE REMOVAL LEFT TIBIA;  Surgeon: Newt Minion, MD;  Location: [facility];  Service: Orthopedics;  Laterality: Left;   I & D EXTREMITY Left 02/10/2022   Procedure: DEBRIDEMENT OF LEFT TIBIA;  Surgeon: Newt Minion, MD;  Location: [facility];  Service: Orthopedics;  Laterality: Left;   LEFT HEART CATH AND CORONARY ANGIOGRAPHY N/A 12/25/2020   Procedure: LEFT HEART CATH AND CORONARY ANGIOGRAPHY;  Surgeon: Leonie Man, MD;  Location: [facility];  Service: Cardiovascular;  Laterality: N/A; (In setting of Acute Hypoxic and Hypercapnic Respiratory Failure with Flash Pulm Edema/Hypertensive Emergency) -  Angiographically minimal CAD.  Moderately reduced LV function with EF of 35 to 45%. LV P-EDP 101/21 mmHg - 30 mmHg   LEFT HEART CATH AND CORONARY ANGIOGRAPHY  09/12/2018   [facility]): [place] LAD ~20% otherwise normal coronary arteries. LVEDP 20 mmHg;- HTN Emergency related Type II MI>   PEG PLACEMENT N/A 01/11/2021   Procedure: PERCUTANEOUS ENDOSCOPIC GASTROSTOMY (PEG) PLACEMENT;  Surgeon: Lucilla Lame, MD;  Location: [facility];  Service: Endoscopy;  Laterality: N/A;   RIGHT HEART CATH N/A 12/25/2020   Procedure: RIGHT HEART CATH;  Surgeon: Leonie Man, MD;  Location: [place] [facility];  Service: Cardiovascular; R IJ: (Acute Hypoxic and Hypercapnic Respiratory Failure with Flash Pulm Edema/Hypertensive Emergency): CO-CI 6.54-2.82. Mild Secondary Pulmonary Hypertension (PCWP 30 mmHg.  PAP 47/13 mmHg with a mean PAP of 33 mmHg, RAP 22 mmHg in setting of AoP-MAP 97/59 mmHg - 71 mmHg,   TRACHEOSTOMY TUBE PLACEMENT N/A 01/12/2021   Procedure: TRACHEOSTOMY;  Surgeon: Carloyn Manner, MD;  Location: [facility];  Service: ENT;  Laterality: N/A;   TRANSTHORACIC ECHOCARDIOGRAM  09/11/2018   [facility]: Moderately reduced LVEF ~35%. 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 Mild-mod AI, Trivial MR?TR. LVEDD 6.0 (reduced compared to 2017: EF ~40-45%, LVEDD 5.6   TRANSTHORACIC ECHOCARDIOGRAM  12/27/2020   [facility]: (In Setting of Acute Hypoxic and Hypercapnic Respiratory Failure with Flash Pulm Edema/Hypertensive Emergency)  EF 45-50%. Mildly reduced LVEF with global HK. Moderately dilated LV with Gr 1 DD - mild LA dilation. Normal RV size Fxn.  Normal [place]   TRANSTHORACIC ECHOCARDIOGRAM  03/07/2021   [facility]: EF improved to 60-65% with Nl LV Fxn & ~ normal diastolic fxn. Normal RV size & fxn. Mild MR & AI.  Normal LA size.   Unknown       Social History:   reports that he quit smoking about 3 years ago. His smoking use included cigarettes. He has a 3.50 pack-year smoking history. He has never  used smokeless tobacco. He reports that he does not currently use alcohol. He reports that he does not currently use drugs.   Family History:  His family history includes Alcohol abuse in his father; Asthma in his brother, mother, and sister; CVA in his mother; Depression in his father; Diabetes Mellitus II in his sister; Hypertension in his brother, mother, and sister; Kidney failure in his mother.   Allergies No Known Allergies   Home Medications  Prior to Admission medications   Medication Sig Start Date End Date Taking? Authorizing Provider  aspirin EC 81 MG tablet  Take 81 mg by mouth daily. Swallow whole.   Yes [provider]  atorvastatin (LIPITOR) 80 MG tablet Take 80 mg by mouth at bedtime.   Yes [provider]  carvedilol (COREG) 6.25 MG tablet Take 1 tablet (6.25 mg total) by mouth 2 (two) times daily with a meal. 03/08/21  Yes Lavina Hamman, MD  diclofenac Sodium (VOLTAREN) 1 % GEL Apply 2 g topically 4 (four) times daily. 03/08/21  Yes Lavina Hamman, MD  FLOMAX 0.4 MG CAPS capsule Take 0.4 mg by mouth daily. 10/12/21  Yes [provider]  furosemide (LASIX) 20 MG tablet Take 20 mg by mouth daily.   Yes [provider]  levETIRAcetam (KEPPRA) 1000 MG tablet Take 1,000 mg by mouth 2 (two) times daily.   Yes [provider]  losartan (COZAAR) 25 MG tablet Take 1 tablet (25 mg total) by mouth daily. 11/09/21  Yes Enzo Bi, MD  [facility] 17 GM/SCOOP powder Take 17 g by mouth every other day. 11/09/21  Yes [provider]  Nutritional Supplements (FEEDING SUPPLEMENT, NEPRO CARB STEADY,) LIQD Take 237 mLs by mouth 3 (three) times daily between meals. 03/08/21  Yes Lavina Hamman, MD  Potassium Bicarb-Citric Acid (EFFER-K) 10 MEQ TBEF Take 10 mEq by mouth daily.   Yes [provider]  ZOLOFT 25 MG tablet Take 25 mg by mouth daily. 06/02/21  Yes [provider]  acetaminophen (TYLENOL) 325 MG tablet Take 650 mg by mouth  every 6 (six) hours as needed (pain.).    [provider]  oxyCODONE-acetaminophen (PERCOCET/ROXICET) 5-325 MG tablet Take 1 tablet by mouth every 4 (four) hours as needed. 02/10/22   Newt Minion, MD     Critical care time: 55 minutes     Darel Hong, AGACNP-BC [facility] Prefer epic messenger for cross cover needs If after hours, please call E-link

## 2022-03-08 NOTE — ED Notes (Signed)
CALLED TO [facility] REGARDING TRANSFER TO [facility] 2:30AM. REP TARA

## 2022-03-08 NOTE — Consult Note (Signed)
PHARMACY -  BRIEF ANTIBIOTIC NOTE   Pharmacy has received consult(s) for cefepime and vancomycin from an ED provider.  The patient's profile has been reviewed for ht/wt/allergies/indication/available labs.    One time order(s) placed for  Cefepime 2 gram Vancomycin 2 gram  Further antibiotics/pharmacy consults should be ordered by admitting physician if indicated.                       Thank you, Sharen Hones, PharmD, BCPS Clinical Pharmacist   03/08/2022  1:07 AM

## 2022-03-08 NOTE — Discharge Summary (Addendum)
Physician Discharge Summary  Patient ID: Jeremy Browning MRN: [id number] DOB/AGE: 01-17-1961 61 y.o.  Admit date: 03/08/2022 Discharge date: 03/08/2022   Brief Pt Description / Synopsis:  61 y.o. male admitted with acute metabolic encephalopathy and acute hypoxic respiratory failure in the setting of breakthrough seizures requiring intubation for airway protection.  Concern for possible aspiration.  Requires to transfer to [facility] for continuous EEG.  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  Patient is currently intubated and sedated, no family is currently available, therefore history is obtained per chart review.   Per ED and nursing notes, the patient presented from his facility around midnight after seizing, staff reported it was generalized tonic-clonic seizure for approximately 20 minutes.  Upon EMS arrival he was given 5 mg of intranasal Versed with resolution of the seizure for several minutes, followed by another tonic-clonic seizure lasting about 45 seconds.  [facility] staff reported a recent therapeutic Keppra level, which she is on 1000 mg twice daily.   After arrival to the ED patient noted to be hypoxic with O2 saturations 81% on nasal cannula, along with rhonchorous breath sounds concerning for  aspiration.  He was intubated by ED provider for airway protection and hypoxia.   ED Course: Initial Vital Signs: Temperature 99.3 F, blood pressure 133/97, pulse 102, respiratory 15, SPO2 100% via nasal cannula Significant Labs: Bicarbonate 21, glucose 131, BUN 31, creatinine 1.69, BNP 127, high-sensitivity troponin 130, lactic acid 5.0, procalcitonin negative, hemoglobin 11.8, hematocrit 38.3 ABG postintubation: pH 7.39/PCO2 41/PO2 85/bicarb 24.8 COVID-19 and influenza PCR negative Urinalysis negative for UTI Urine drug screen negative Imaging Chest X-ray>>IMPRESSION: 1. Endotracheal tube tip 3.4 cm from the carina. 2. Patchy airspace disease throughout the right lung greatest in the infrahilar region. Findings may represent pneumonia, aspiration or less likely asymmetric pulmonary edema. CT head>>IMPRESSION: No acute intracranial abnormality. Stable remote infarcts. Medications Administered: 2 g IV Keppra, 3 L LR boluses, cefepime and Flagyl IV, initiated on propofol and fentanyl infusions (later transitioned to Versed infusion)   Patient seen by neurology, who recommends ideally that patient be admitted to a hospital with continuous EEG capabilities.  However currently [place], [facility], and [facility] are at capacity and unable to accept transfer.  Therefore PCCM is asked to admit for further work-up and treatment.   Please see "significant hospital events" section below for full detailed hospital course.   Discharge Plan by Diagnosis:   Acute Hypoxic Respiratory Failure in the setting of presumed Aspiration & Acute Encephalopathy and breakthrough seizures Intubated for airway protection PMHx: Sleep apnea -Full vent support, implement lung protective strategies -Plateau pressures less than 30 cm H20 -Wean FiO2 &  PEEP as tolerated to maintain O2 sats >92% -Follow intermittent Chest X-ray & ABG as needed -Spontaneous Breathing Trials when respiratory parameters met and mental  status permits -Implement VAP Bundle -Prn Bronchodilators -Antibiotics as above   Breakthrough seizures Sedation needs in the setting of mechanical ventilation PMHx: Right MCA stroke with residual left-sided hemiparesis and contractures, seizures on Keppra -Maintain a RASS goal of -1 to -2 -Fentanyl and Versed drips as needed to maintain RASS goal (per neurology do not wean Versed drip on 7/19) -Avoid sedating medications as able -Daily wake up assessment -CT head negative -Urine drug screen negative -Seizure precautions -Neurology following, appreciate input ~spot EEGs as per neurology (ideally patient should be transferred to facility capable of continuous EEG when bed availability arises) -Continue Keppra 1500 mg twice daily -MRI brain pending -Aspiration coverage changed to Unasyn from cefepime due to potential for cefepime induced encephalopathy and rhythmic abnormalities on EEG   Hypotension: Sedation related vs developing sepsis Elevated troponin, suspect demand ischemia Chronic congestive heart failure with mildly reduced ejection fraction (LVEF 40 to 63%, grade 1 diastolic dysfunction), not acutely exacerbated PMHx: Hypertension, CAD, Hyperlipidemia, LBBB -Continuous cardiac monitoring -Maintain MAP >65 -Cautious IV fluids (received 3 L fluid resuscitation in ED) -Vasopressors as needed to maintain MAP goal -Trend lactic acid until normalized (5.0 ~ >9 ~ 3.5 ~ 2.0) -HS Troponin peaked at 806 -Consider repeat Echocardiogram (echocardiogram 11/07/2021: LVEF 40 to 78%, grade 1 diastolic dysfunction, RV function normal)   Concern for aspiration -Monitor fever curve -Trend WBC's & Procalcitonin -Follow cultures as above -Continue empiric Unasyn pending cultures & sensitivities   CKD Stage III Lactic acidosis, suspect due to seizure activity ~ IMPROVED -Monitor I&O's / urinary output -Follow BMP -Ensure adequate renal perfusion -Avoid nephrotoxic agents as able -Replace  electrolytes as indicated -IV fluids -Trend lactic acid   Diabetes Mellitus Type II -CBG's q4h; Target range of 140 to 180 -SSI -Follow ICU Hypo/Hyperglycemia protocol    Significant Events:  7/19: Presented to ED with breakthrough seizures requiring intubation for airway protection.  Neurology recommends transfer to facility capable of continuous EEG, however all eligible facilities are at capacity and unable to assess for transfer.  PCCM asked to admit.               Significant Diagnostic Studies:  Chest X-ray>>IMPRESSION: 1. Endotracheal tube tip 3.4 cm from the carina. 2. Patchy airspace disease throughout the right lung greatest in the infrahilar region. Findings may represent pneumonia, aspiration or less likely asymmetric pulmonary edema. CT head>>IMPRESSION: No acute intracranial abnormality. Stable remote infarcts.            Micro Data:  7/19: SARS-CoV-2 and influenza PCR>> negative 7/19: Blood culture x2>> 7/19: Urine>> 7/19: Tracheal aspirate>>  Antimicrobials:  Cefepime 7/19>7/19 Flagyl 7/19>> 7/19 Vancomycin 7/19 x 1 dose Unasyn 7/19>>   Consults:  PCCM  Neurology   Discharge Exam:  Examination: General: Acute on chronically ill-appearing male, laying in bed, intubated and sedated, no acute distress HENT: Atraumatic, normocephalic, neck supple, no JVD, orally intubated Lungs: Coarse breath sounds bilaterally, synchronous with the vent, even, nonlabored Cardiovascular: Regular rate and rhythm, S1-S2, no murmurs, rubs, gallops Abdomen: Soft, nontender, nondistended, no guarding rebound tenderness, bowel sounds positive x4 Extremities: No deformities, 1+ edema bilateral lower extremities Neuro: Heavily sedated, no response to painful stimuli, pupils PERRLA (sluggish at 1 mm bilaterally) GU: Foley catheter in place draining yellow urine  Vitals:   03/08/22 1030 03/08/22 1040 03/08/22 1100 03/08/22 1112  BP: (!) 114/39 (!) 113/40 (!) 128/55 (!) 161/144   Pulse: (!) 55 (!) 55 (!) 59 65  Resp: 20 19 (!) 22 (!) 22  Temp: 98.7 F (37.1 C) 98.6 F (37 C) 98.2 F (36.8 C) 98.7 F (37.1 C)  TempSrc:    Oral  SpO2: 100% 100% 100% 100%  Weight:    106.2 kg  Height:    _0  (1.702 m)     Discharge Labs:   BMET Recent Labs  Lab 03/08/22 0053 03/08/22 0645  NA 137 139  138  K 4.3 4.1  4.4  CL 107 112*  111  CO2 21* 25  24  GLUCOSE 131* 88  86  BUN 31* 29*  27*  CREATININE 1.69* 1.38*  1.42*  CALCIUM 8.6* 7.9*  7.7*  MG 2.0  --     CBC Recent Labs  Lab 03/08/22 0053 03/08/22 0645  HGB 11.8* 10.1*  HCT 38.3* 32.5*  WBC 7.5 7.2  PLT 199 119*    Anti-Coagulation Recent Labs  Lab 03/08/22 0055  INR 1.1          Allergies as of 03/08/2022   No Known Allergies      Medication List     STOP taking these medications    acetaminophen 325 MG tablet Commonly known as: TYLENOL   aspirin EC 81 MG tablet   atorvastatin 80 MG tablet Commonly known as: LIPITOR   carvedilol 6.25 MG tablet Commonly known as: COREG   diclofenac Sodium 1 % Gel Commonly known as: VOLTAREN   Effer-K 10 MEQ Tbef Generic drug: Potassium Bicarb-Citric Acid   feeding supplement (NEPRO CARB STEADY) Liqd   Flomax 0.4 MG Caps capsule Generic drug: tamsulosin   furosemide 20 MG tablet Commonly known as: LASIX   levETIRAcetam 1000 MG tablet Commonly known as: KEPPRA   losartan 25 MG tablet Commonly known as: COZAAR   MiraLax 17 GM/SCOOP powder Generic drug: polyethylene glycol powder Replaced by: polyethylene glycol 17 g packet   oxyCODONE-acetaminophen 5-325 MG tablet Commonly known as: PERCOCET/ROXICET   Zoloft 25 MG tablet Generic drug: sertraline       TAKE these medications    Ampicillin-Sulbactam 3 g in sodium chloride 0.9 % 100 mL Inject 3 g into the vein every 6 (six) hours.   docusate 50 MG/5ML liquid Commonly known as: COLACE Place 10 mLs (100 mg total) into feeding tube 2 (two) times daily  as needed for mild constipation.   docusate 50 MG/5ML liquid Commonly known as: COLACE Place 10 mLs (100 mg total) into feeding tube 2 (two) times daily.   enoxaparin 60 MG/0.6ML injection Commonly known as: LOVENOX Inject 0.55 mLs (55 mg total) into the skin daily. Start taking on: March 09, 2022   fentaNYL 10 mcg/ml Soln infusion Inject 0-400 mcg/hr into the vein continuous.   insulin aspart 100 UNIT/ML injection Commonly known as: novoLOG Inject 0-15 Units into the skin every 4 (four) hours.   lactated ringers infusion Inject 75 mLs into the vein continuous.   levETIRacetam 1500 MG/100ML Soln Commonly known as: KEPPRA Inject 100 mLs (1,500 mg total) into the vein every 12 (twelve) hours. Start taking on: March 09, 2022   midazolam-sodium chloride 100-0.9 MG/100ML-% Soln Inject 0.5-10 mg/hr into the vein continuous.   norepinephrine 4-5 MG/250ML-% Soln Commonly known as: LEVOPHED Inject 2-10 mcg/min into the vein continuous.   polyethylene glycol 17 g packet Commonly known as: MIRALAX / GLYCOLAX Place 17 g into feeding tube daily  as needed for moderate constipation. Replaces: MiraLax 17 GM/SCOOP powder   polyethylene glycol 17 g packet Commonly known as: MIRALAX / GLYCOLAX Place 17 g into feeding tube daily. Start taking on: March 09, 2022   propofol 1000 MG/100ML Emul injection Commonly known as: DIPRIVAN Inject 540.5-8,648 mcg/min into the vein continuous.   sodium chloride 0.9 % infusion Inject 250 mLs into the vein continuous.            Disposition: ICU  Discharged Condition: Jeremy Browning has met maximum benefit of inpatient care at [facility] and requires transfer to higher level of care for continuous EEG.    Time spent on disposition:  40 Minutes.     Signed: Darel Hong, AGACNP-BC [facility] Prefer epic messenger for cross cover needs If after hours, please call E-link

## 2022-03-08 NOTE — Consult Note (Signed)
CODE SEPSIS - PHARMACY COMMUNICATION  **Broad Spectrum Antibiotics should be administered within 1 hour of Sepsis diagnosis**  Time Code Sepsis Called/Page Received: 0107  Antibiotics Ordered: cefepime, vancomycin, metronidazole  Time of 1st antibiotic administration: 0231  Additional action taken by pharmacy: reached out to RN   If necessary, Name of Provider/Nurse Contacted: Audie Clear, RN    Sharen Hones ,PharmD, BCPS Clinical Pharmacist  03/08/2022  1:06 AM

## 2022-03-08 NOTE — ED Notes (Signed)
Sedation restarted per EDP.

## 2022-03-08 NOTE — Progress Notes (Signed)
Pharmacy Antibiotic Note  Jeremy Browning is a 61 y.o. male for which pharmacy has been consulted for unasyn dosing for  aspiration pneumonia .  Patient with a history of rt MCA stroke w/ residual lt-sided hemiparesis/contractures, seizures, VT arrest in 2022 with anoxic brain injury and stroke, HF, CKD. Patient presenting with seizure.  SCr 1.42 WBC 7.2>9.3; LA 2; T 99.5 F; HR 51; RR 20>16 MRSA PCR neg COVID/Flu - neg  Plan: Unasyn 3g q6h Trend WBC, Fever, Renal function, & Clinical course F/u cultures, clinical course, WBC, fever De-escalate when able     Temp (24hrs), Avg:98.5 F (36.9 C), Min:98 F (36.7 C), Max:100.3 F (37.9 C)  Recent Labs  Lab 03/08/22 0053 03/08/22 0054 03/08/22 0236 03/08/22 0453 03/08/22 0645  WBC 7.5  --   --   --  7.2  CREATININE 1.69*  --   --   --  1.38*  1.42*  LATICACIDVEN  --  5.0* >9.0* 3.5* 2.0*    Estimated Creatinine Clearance: 62.6 mL/min (A) (by C-G formula based on SCr of 1.42 mg/dL (H)).    No Known Allergies  Antimicrobials this admission: Cefepime, flagyl, and vancomycin x 1 in ED 7/19 Unasyn 7/19 >>   Microbiology results: Pending  Thank you for allowing pharmacy to be a part of this patient's care.  Delmar Landau, PharmD, BCPS 03/08/2022 5:57 PM ED Clinical Pharmacist -  [phone]

## 2022-03-08 NOTE — ED Notes (Signed)
Called [facility] spoke to [facility] for transfer, said [facility] at capacity

## 2022-03-08 NOTE — ED Triage Notes (Signed)
Pt was seen to be seizing at facility [facility] care 0007. 88% 6lpm O2. 92% 15lpm Non-rebreather, ambu bag 96%. Pt received to 5mg  internasally in 2.5 and 2.5mg . Pt has vomit in mouth

## 2022-03-08 NOTE — H&P (Signed)
NAME:  Jeremy Browning, MRN:  [id number], DOB:  [birth year], LOS: 0 ADMISSION DATE:  03/08/2022, CONSULTATION DATE:  7/19 REFERRING MD:  Dr. 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 He had a prolonged course which included trach and peg, but he has since been able to liberate from both. He resides in SNF where he developed seizures in the early AM hours of 7/19. Upon EMS arrival the seizure was still in process (~20 minutes) with tonic clonic movements. He was given intranasal versed with cessation of sz, but shortly after began seizing again. Upon arrival to ED he was intubated for airway protection and hypoxia with sats of 81% on room air. CT head was non-acute. He was admitted to the ICU service for status epilepticus. Spot EEG negative. Course complicated by bradycardia requiring atropine, hypotension requiring norepinephrine, and elevated troponin which plateaued and has since began to downtrend. The patient was transferred to [facility] for continuous EEG.   Pertinent  Medical History   has a past medical history of CHF (congestive heart failure) (HCC), Diabetes mellitus type II, non insulin dependent (HCC), Essential hypertension [place] [facility] like), h/o Proteus pneumonia (HCC) (02/17/2021), Heart failure (HCC), Seizures (HCC), Severe hypoxic-ischemic encephalopathy, Sleep apnea, Stroke (HCC), and Venous stasis dermatitis of left lower extremity.   Significant Hospital Events: Including procedures, antibiotic start and stop dates in addition to other pertinent events   7/18 admitted to [facility] for status epilepticus. Tx to [facility] for LTM  Interim History / Subjective:    Objective   Blood pressure (!) 133/49, pulse 72, resp. rate 17, SpO2 99 %.    Vent Mode: PRVC FiO2 (%):  [35 %-50 %] 35 % Set Rate:   [16 bmp] 16 bmp Vt Set:  [500 mL-520 mL] 520 mL PEEP:  [5 cmH20] 5 cmH20 Plateau Pressure:  [24 cmH20] 24 cmH20  No intake or output data in the 24 hours ending 03/08/22 1714 There were no vitals filed for this visit.  Examination: General: adult male in NAD on vent HENT: [place]/AT, PERRL, no JVD Lungs: Clear bilateral breath sounds Cardiovascular: RRR, no MRG Abdomen: Soft, non-tender, non-distended Extremities: LUE contracture. Bilateral lower extremity non-pitting edema.  Neuro: [place]ate RASS goal -3 GU: Foley  Resolved Hospital Problem list     Assessment & Plan:   Status epilepticus: history of seizures treated with Keppra. Now presenting from facility with tonic-clonic sz. Recurrent after versed. Spot EEG negative at [facility].  - Admit to ICU at [facility] - Full vent support for airway protection - Continue Keppra 1.5 grams daily - Continue versed infusion. Do not wean. Per Neuro at [facility] - Order placed for LTM - Will consult Neurology at [facility]  Acute hypoxemic respiratory failure: secondary to aspiration  - Full vent support - SpO2 goal > 90% - Fentanyl and versed infusion for sz at this point. Once given the ok to wean by neurology we will target a RASS goal of -1 to -2 until we are prepared for SBT. - Unasyn for aspiration continue. - ABG  - Follow cultures  Hypotension: felt to be secondary to sedating medications. Chronic HFrEF  - Requiring low dose norepinephrine - MAP goal 65 - Echocardiogram - IVF resuscitation  CKD III - repeat labs - IVF as above  NSTEMI - ensure troponin continues  to clear  DM II - CBG monitoring and SSI   Best Practice (right click and "Reselect all SmartList Selections" daily)   Diet/type: NPO DVT prophylaxis: prophylactic heparin  GI prophylaxis: PPI Lines: Central line Foley:  Yes, and it is still needed Code Status:  full code Last date of multidisciplinary goals of care discussion [ ]   Labs   CBC: Recent Labs  Lab  03/08/22 0053 03/08/22 0645  WBC 7.5 7.2  NEUTROABS 4.8 5.3  HGB 11.8* 10.1*  HCT 38.3* 32.5*  MCV 81.7 81.7  PLT 199 119*    Basic Metabolic Panel: Recent Labs  Lab 03/08/22 0053 03/08/22 0645  NA 137 139  138  K 4.3 4.1  4.4  CL 107 112*  111  CO2 21* 25  24  GLUCOSE 131* 88  86  BUN 31* 29*  27*  CREATININE 1.69* 1.38*  1.42*  CALCIUM 8.6* 7.9*  7.7*  MG 2.0  --    GFR: Estimated Creatinine Clearance: 62.6 mL/min (A) (by C-G formula based on SCr of 1.42 mg/dL (H)). Recent Labs  Lab 03/08/22 0053 03/08/22 0054 03/08/22 0236 03/08/22 0453 03/08/22 0645  PROCALCITON <0.10  --   --   --   --   WBC 7.5  --   --   --  7.2  LATICACIDVEN  --  5.0* >9.0* 3.5* 2.0*    Liver Function Tests: Recent Labs  Lab 03/08/22 0053  AST 35  ALT 32  ALKPHOS 86  BILITOT 0.5  PROT 7.5  ALBUMIN 3.7   No results for input(s): "LIPASE", "AMYLASE" in the last 168 hours. No results for input(s): "AMMONIA" in the last 168 hours.  ABG    Component Value Date/Time   PHART 7.39 03/08/2022 0207   PCO2ART 41 03/08/2022 0207   PO2ART 85 03/08/2022 0207   HCO3 24.8 03/08/2022 0207   ACIDBASEDEF 0.2 03/08/2022 0207   O2SAT 97.7 03/08/2022 0207     Coagulation Profile: Recent Labs  Lab 03/08/22 0055  INR 1.1    Cardiac Enzymes: No results for input(s): "CKTOTAL", "CKMB", "CKMBINDEX", "TROPONINI" in the last 168 hours.  HbA1C: Hgb A1c MFr Bld  Date/Time Value Ref Range Status  02/26/2021 06:46 PM 7.1 (H) 4.8 - 5.6 % Final    Comment:    (NOTE) Pre diabetes:          5.7%-6.4%  Diabetes:              >6.4%  Glycemic control for   <7.0% adults with diabetes   12/25/2020 04:46 PM 5.9 (H) 4.8 - 5.6 % Final    Comment:    (NOTE) Pre diabetes:          5.7%-6.4%  Diabetes:              >6.4%  Glycemic control for   <7.0% adults with diabetes     CBG: Recent Labs  Lab 03/08/22 0108 03/08/22 1120 03/08/22 1257 03/08/22 1321  GLUCAP 127* 73 71 96     Review of Systems:   Patient is encephalopathic and/or intubated. Therefore history has been obtained from chart review.    Past Medical History:  He,  has a past medical history of CHF (congestive heart failure) (HCC), Diabetes mellitus type II, non insulin dependent (HCC), Essential hypertension 03/10/22 like), h/o Proteus pneumonia (HCC) (02/17/2021), Heart failure (HCC), Seizures (HCC), Severe hypoxic-ischemic encephalopathy, Sleep apnea, Stroke (HCC), and Venous stasis dermatitis of left lower extremity.   Surgical History:  Past Surgical History:  Procedure Laterality Date   HARDWARE REMOVAL Left 02/10/2022   Procedure: HARDWARE REMOVAL LEFT TIBIA;  Surgeon: Nadara Mustard, MD;  Location[facility] OR;  Service: Orthopedics;  Laterality: Left;   I & D EXTREMITY Left 02/10/2022   Procedure: DEBRIDEMENT OF LEFT TIBIA;  Surgeon: Nadara Mustard, MD;  Location: [facility] OR;  Service: Orthopedics;  Laterality: Left;   LEFT HEART CATH AND CORONARY ANGIOGRAPHY N/A 12/25/2020   Procedure: LEFT HEART CATH AND CORONARY ANGIOGRAPHY;  Surgeon: Marykay Lex, MD;  Location: [facility] INVASIVE CV LAB;  Service: Cardiovascular;  Laterality: N/A; (In setting of Acute Hypoxic and Hypercapnic Respiratory Failure with Flash Pulm Edema/Hypertensive Emergency) - Angiographically minimal CAD.  Moderately reduced LV function with EF of 35 to 45%. LV P-EDP 101/21 mmHg - 30 mmHg   LEFT HEART CATH AND CORONARY ANGIOGRAPHY  09/12/2018   [facility]): Ostial LAD ~20% otherwise normal coronary arteries. LVEDP 20 mmHg;- HTN Emergency related Type II MI>   PEG PLACEMENT N/A 01/11/2021   Procedure: PERCUTANEOUS ENDOSCOPIC GASTROSTOMY (PEG) PLACEMENT;  Surgeon: Midge Minium, MD;  Location: [facility] ENDOSCOPY;  Service: Endoscopy;  Laterality: N/A;   RIGHT HEART CATH N/A 12/25/2020   Procedure: RIGHT HEART CATH;  Surgeon: Marykay Lex, MD;  Location: [facility] INVASIVE CV LAB;  Service: Cardiovascular; R IJ: (Acute Hypoxic and Hypercapnic  Respiratory Failure with Flash Pulm Edema/Hypertensive Emergency): CO-CI 6.54-2.82. Mild Secondary Pulmonary Hypertension (PCWP 30 mmHg.  PAP 47/13 mmHg with a mean PAP of 33 mmHg, RAP 22 mmHg in setting of AoP-MAP 97/59 mmHg - 71 mmHg,   TRACHEOSTOMY TUBE PLACEMENT N/A 01/12/2021   Procedure: TRACHEOSTOMY;  Surgeon: Bud Face, MD;  Location: [facility] ORS;  Service: ENT;  Laterality: N/A;   TRANSTHORACIC ECHOCARDIOGRAM  09/11/2018   [facility]: Moderately reduced LVEF ~35%. 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 Mild-mod AI, Trivial MR?TR. LVEDD 6.0 (reduced compared to 2017: EF ~40-45%, LVEDD 5.6   TRANSTHORACIC ECHOCARDIOGRAM  12/27/2020   [facility]: (In Setting of Acute Hypoxic and Hypercapnic Respiratory Failure with Flash Pulm Edema/Hypertensive Emergency)  EF 45-50%. Mildly reduced LVEF with global HK. Moderately dilated LV with Gr 1 DD - mild LA dilation. Normal RV size Fxn.  Normal Vavles   TRANSTHORACIC ECHOCARDIOGRAM  03/07/2021   [facility]: EF improved to 60-65% with Nl LV Fxn & ~ normal diastolic fxn. Normal RV size & fxn. Mild MR & AI.  Normal LA size.   Unknown       Social History:   reports that he quit smoking about 3 years ago. His smoking use included cigarettes. He has a 3.50 pack-year smoking history. He has never used smokeless tobacco. He reports that he does not currently use alcohol. He reports that he does not currently use drugs.   Family History:  His family history includes Alcohol abuse in his father; Asthma in his brother, mother, and sister; CVA in his mother; Depression in his father; Diabetes Mellitus II in his sister; Hypertension in his brother, mother, and sister; Kidney failure in his mother.   Allergies No Known Allergies   Home Medications  Prior to Admission medications   Medication Sig Start Date End Date Taking? Authorizing Provider  Ampicillin-Sulbactam 3 g in sodium chloride 0.9 % 100 mL Inject 3 g into the vein every 6 (six) hours. 03/08/22    Judithe Modest, NP  docusate (COLACE) 50 MG/5ML liquid Place 10 mLs (100 mg total) into feeding tube 2 (two) times daily as needed  for mild constipation. 03/08/22   Judithe Modest, NP  docusate (COLACE) 50 MG/5ML liquid Place 10 mLs (100 mg total) into feeding tube 2 (two) times daily. 03/08/22   Judithe Modest, NP  enoxaparin (LOVENOX) 60 MG/0.6ML injection Inject 0.55 mLs (55 mg total) into the skin daily. 03/09/22   Harlon Ditty D, NP  fentaNYL 10 mcg/ml SOLN infusion Inject 0-400 mcg/hr into the vein continuous. 03/08/22   Judithe Modest, NP  insulin aspart (NOVOLOG) 100 UNIT/ML injection Inject 0-15 Units into the skin every 4 (four) hours. 03/08/22   Harlon Ditty D, NP  lactated ringers infusion Inject 75 mLs into the vein continuous. 03/08/22   Judithe Modest, NP  levETIRacetam (KEPPRA) 1500 MG/100ML SOLN Inject 100 mLs (1,500 mg total) into the vein every 12 (twelve) hours. 03/09/22   Judithe Modest, NP  midazolam-sodium chloride 100-0.9 MG/100ML-% SOLN Inject 0.5-10 mg/hr into the vein continuous. 03/08/22   Harlon Ditty D, NP  norepinephrine (LEVOPHED) 4-5 MG/250ML-% SOLN Inject 2-10 mcg/min into the vein continuous. 03/08/22   Judithe Modest, NP  polyethylene glycol (MIRALAX / GLYCOLAX) 17 g packet Place 17 g into feeding tube daily as needed for moderate constipation. 03/08/22   Judithe Modest, NP  polyethylene glycol (MIRALAX / GLYCOLAX) 17 g packet Place 17 g into feeding tube daily. 03/09/22   Harlon Ditty D, NP  propofol (DIPRIVAN) 1000 MG/100ML EMUL injection Inject 540.5-8,648 mcg/min into the vein continuous. 03/08/22   Harlon Ditty D, NP  sodium chloride 0.9 % infusion Inject 250 mLs into the vein continuous. 03/08/22   Judithe Modest, NP     Critical care time: 48 minutes     Joneen Roach, AGACNP-BC [facility]  See Amion for personal pager PCCM on call pager [phone] until 7pm. Please call Elink 7p-7a.  [phone]  03/08/2022 5:58 PM

## 2022-03-08 NOTE — Progress Notes (Signed)
Patient arrived via EMS Emergency Traffic. Patient reported to have been seizing and vomiting. Decision was made to intubate patient for airway protection. Patient was intubated by Dr. Elesa Massed using the glidescope with a #3 blade. Patient was intubated with a 7.5cm ETT secured at 25cm at the right lip. Breath sounds equal with rhonchi. Suctioning large amount of tan emesis secretions. CXR ordered and verified. Patient transported to CT scan and back without complication. ABG drawn and resulted. Will continue to monitor.

## 2022-03-08 NOTE — Consult Note (Signed)
NEUROLOGY CONSULTATION NOTE   Date of service: March 08, 2022 Patient Name: Jeremy Browning MRN:  [id number] DOB:  09-Oct-1960 Reason for consult: multiple breakthrough seizures Requesting physician: Dr. 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 tach arrest in 2022 with anoxic brain injury and stroke at that point, heart failure, CKD stage III admitted with breakthrough seizures.  Patient started seizing around midnight per facility staff who reported that he had a generalized tonic-clonic seizure for approximately 20 minutes.  On arrival EMS gave him 5 mg of intranasal Versed and he stopped seizing for several minutes then had another generalized tonic-clonic seizure lasting approximately 45 seconds with EMS.  Patient was favored to have aspirated and required intubation for hypoxic respiratory failure and severe encephalopathy.  CT head in ED NAICP, large known prior R MCA infarct, personal review   ROS   UTA 2/2 encephalopathy  Past History   I have reviewed the following:  Past Medical History:  Diagnosis Date   CHF (congestive heart failure) (HCC)    Diabetes mellitus type II, non insulin dependent ([place])    Essential hypertension [name]   h/o Proteus pneumonia ([place]) 02/17/2021   Heart failure ([place])    Unknown details.  By report no cardiac surgery   Seizures ([place])    Severe hypoxic-ischemic encephalopathy    Sleep apnea    Stroke [facility])    Venous stasis dermatitis of left lower extremity    Past Surgical History:  Procedure Laterality Date   HARDWARE REMOVAL Left 02/10/2022   Procedure: HARDWARE REMOVAL LEFT TIBIA;  Surgeon: Newt Minion, MD;  Location: [place];  Service: Orthopedics;  Laterality: Left;   I & D EXTREMITY Left 02/10/2022    Procedure: DEBRIDEMENT OF LEFT TIBIA;  Surgeon: Newt Minion, MD;  Location: [place];  Service: Orthopedics;  Laterality: Left;   LEFT HEART CATH AND CORONARY ANGIOGRAPHY N/A 12/25/2020   Procedure: LEFT HEART CATH AND CORONARY ANGIOGRAPHY;  Surgeon: Leonie Man, MD;  Location: [place] [facility];  Service: Cardiovascular;  Laterality: N/A; (In setting of Acute Hypoxic and Hypercapnic Respiratory Failure with Flash Pulm Edema/Hypertensive Emergency) - Angiographically minimal CAD.  Moderately reduced LV function with EF of 35 to 45%. LV P-EDP 101/21 mmHg - 30 mmHg   LEFT HEART CATH AND CORONARY ANGIOGRAPHY  09/12/2018   [facility]): Ostial LAD ~20% otherwise normal coronary arteries. LVEDP 20 mmHg;- HTN Emergency related Type II MI>   PEG PLACEMENT N/A 01/11/2021   Procedure: PERCUTANEOUS ENDOSCOPIC GASTROSTOMY (PEG) PLACEMENT;  Surgeon: Lucilla Lame, MD;  Location: [facility];  Service: Endoscopy;  Laterality: N/A;   RIGHT HEART CATH N/A 12/25/2020   Procedure: RIGHT HEART CATH;  Surgeon: Leonie Man, MD;  Location: [place] [facility];  Service: Cardiovascular; R IJ: (Acute Hypoxic and Hypercapnic Respiratory Failure with Flash Pulm Edema/Hypertensive Emergency): CO-CI 6.54-2.82. Mild Secondary Pulmonary Hypertension (PCWP 30 mmHg.  PAP 47/13 mmHg with a mean PAP of 33 mmHg, RAP 22 mmHg in setting of AoP-MAP 97/59 mmHg - 71 mmHg,   TRACHEOSTOMY TUBE PLACEMENT N/A 01/12/2021   Procedure: TRACHEOSTOMY;  Surgeon: Carloyn Manner, MD;  Location: [facility];  Service: ENT;  Laterality: N/A;  TRANSTHORACIC ECHOCARDIOGRAM  09/11/2018   [facility]: Moderately reduced LVEF ~35%. 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 Mild-mod AI, Trivial MR?TR. LVEDD 6.0 (reduced compared to 2017: EF ~40-45%, LVEDD 5.6   TRANSTHORACIC ECHOCARDIOGRAM  12/27/2020   [facility]: (In Setting of Acute Hypoxic and Hypercapnic Respiratory Failure with Flash Pulm Edema/Hypertensive Emergency)  EF 45-50%. Mildly  reduced LVEF with global HK. Moderately dilated LV with Gr 1 DD - mild LA dilation. Normal RV size Fxn.  Normal [place]   TRANSTHORACIC ECHOCARDIOGRAM  03/07/2021   [facility]: EF improved to 60-65% with Nl LV Fxn & ~ normal diastolic fxn. Normal RV size & fxn. Mild MR & AI.  Normal LA size.   Unknown     Family History  Problem Relation Age of Onset   Asthma Mother    Hypertension Mother        LIkely Severe/Malignant   Kidney failure Mother        ESRD - HD   CVA Mother    Alcohol abuse Father    Depression Father    Asthma Sister    Hypertension Sister    Diabetes Mellitus II Sister    Asthma Brother    Hypertension Brother    Social History   Socioeconomic History   Marital status: Widowed    Spouse name: Not on file   Number of children: 0   Years of education: 3   Highest education level: Not on file  Occupational History   Not on file  Tobacco Use   Smoking status: Former    Packs/day: 0.50    Years: 7.00    Total pack years: 3.50    Types: Cigarettes    Quit date: 09/11/2018    Years since quitting: 3.4   Smokeless tobacco: Never  Vaping Use   Vaping Use: Never used  Substance and Sexual Activity   Alcohol use: Not Currently   Drug use: Not Currently   Sexual activity: Not Currently  Other Topics Concern   Not on file  Social History Narrative   Widowed/Single. Unemployed -- Brother provides financial support.    He lives with his brother and the brother's family.  (Sister-In-Law along with Niece &Nephew) -- has "his room".    Recently moved here to the [facility] area from ~[place] (prior to May 2022 [facility] Admission).  Has never been established here at [facility].      ->  According to his Brother, he likely related to cognitive difficulties (only 3rd Grade Education - unable to read/write), he does not have a job, has difficulty affording medications and other therapies.      Previously worked Statistician Arts administrator for [employer];      Prior to moving to [place]  - was going to [facility]) in [place] -- was able get most of his medications from there.      Previously Followed by:    Rise Patience, NP -                            [address] [place], [place].    o Dr. Loyal Buba  & Vonzell Schlatter, NP  ([facility])    o Arlie Solomons, MD - [place] [facility],      o Had Telemedicine Visit with Laurice Record. Daubert, MD [facility])   Social Determinants of Health   Financial Resource Strain: Not  on file  Food Insecurity: Not on file  Transportation Needs: Not on file  Physical Activity: Not on file  Stress: Not on file  Social Connections: Not on file   No Known Allergies  Medications   (Not in a hospital admission)     Current Facility-Administered Medications:    acetaminophen (TYLENOL) suppository 650 mg, 650 mg, Rectal, Once, Ward, Kristen N, DO   ceFEPIme (MAXIPIME) 2 g in sodium chloride 0.9 % 100 mL IVPB, 2 g, Intravenous, Q12H, Dolan, Carissa E, RPH   etomidate (AMIDATE) injection, , Intravenous, PRN, Ward, Kristen N, DO, 30 mg at 03/08/22 0056   fentaNYL in NS (24mcg/ml) infusion-PREMIX, 0-400 mcg/hr, Intravenous, Continuous, Ward, Kristen N, DO, Last Rate: 20 mL/hr at 03/08/22 0700, 200 mcg/hr at 03/08/22 0700   lactated ringers infusion, , Intravenous, Continuous, Ward, Kristen N, DO, Last Rate: 150 mL/hr at 03/08/22 0309, New Bag at 03/08/22 0309   levETIRAcetam (KEPPRA) IVPB 1500 mg/ 100 mL premix, 1,500 mg, Intravenous, Q12H, Ward, Kristen N, DO   metroNIDAZOLE (FLAGYL) IVPB 500 mg, 500 mg, Intravenous, Q8H, Ward, Kristen N, DO, Stopped at 03/08/22 0550   midazolam (VERSED) 100 mg/100 mL (1 mg/mL) premix infusion, 0.5-10 mg/hr, Intravenous, Continuous, Ward, Kristen N, DO, Last Rate: 7 mL/hr at 03/08/22 0700, 7 mg/hr at 03/08/22 0700   propofol (DIPRIVAN) 1000 MG/100ML infusion, 5-80 mcg/kg/min, Intravenous, Continuous, Ward, Kristen N, DO, Stopped at  03/08/22 0145   propofol (DIPRIVAN) 1000 MG/100ML infusion, , Intravenous, Continuous PRN, Ward, Kristen N, DO, Stopped at 03/08/22 0259   succinylcholine (ANECTINE) injection, , Intravenous, PRN, Ward, Kristen N, DO, 120 mg at 03/08/22 0057  Current Outpatient Medications:    aspirin EC 81 MG tablet, Take 81 mg by mouth daily. Swallow whole., Disp: , Rfl:    atorvastatin (LIPITOR) 80 MG tablet, Take 80 mg by mouth at bedtime., Disp: , Rfl:    carvedilol (COREG) 6.25 MG tablet, Take 1 tablet (6.25 mg total) by mouth 2 (two) times daily with a meal., Disp: 60 tablet, Rfl: 0   diclofenac Sodium (VOLTAREN) 1 % GEL, Apply 2 g topically 4 (four) times daily., Disp: 100 g, Rfl: 0   FLOMAX 0.4 MG CAPS capsule, Take 0.4 mg by mouth daily., Disp: , Rfl:    furosemide (LASIX) 20 MG tablet, Take 20 mg by mouth daily., Disp: , Rfl:    levETIRAcetam (KEPPRA) 1000 MG tablet, Take 1,000 mg by mouth 2 (two) times daily., Disp: , Rfl:    losartan (COZAAR) 25 MG tablet, Take 1 tablet (25 mg total) by mouth daily., Disp: , Rfl:    MIRALAX 17 GM/SCOOP powder, Take 17 g by mouth every other day., Disp: , Rfl:    Nutritional Supplements (FEEDING SUPPLEMENT, NEPRO CARB STEADY,) LIQD, Take 237 mLs by mouth 3 (three) times daily between meals., Disp: 10000 mL, Rfl: 0   Potassium Bicarb-Citric Acid (EFFER-K) 10 MEQ TBEF, Take 10 mEq by mouth daily., Disp: , Rfl:    ZOLOFT 25 MG tablet, Take 25 mg by mouth daily., Disp: , Rfl:    acetaminophen (TYLENOL) 325 MG tablet, Take 650 mg by mouth every 6 (six) hours as needed (pain.)., Disp: , Rfl:    oxyCODONE-acetaminophen (PERCOCET/ROXICET) 5-325 MG tablet, Take 1 tablet by mouth every 4 (four) hours as needed., Disp: 30 tablet, Rfl: 0  Vitals   Vitals:   03/08/22 0950 03/08/22 1000 03/08/22 1010 03/08/22 1020  BP: (!) 118/42 (!) 125/43 (!) 115/40 (!) 126/50  Pulse: Marland Kitchen)  56 (!) 59 (!) 55 (!) 52  Resp: (!) 21 (!) 21 (!) 22 (!) 22  Temp: 98.7 F (37.1 C) 98.7 F (37.1 C)  98.7 F (37.1 C) 98.7 F (37.1 C)  TempSrc:      SpO2: 100% 100% 100% 96%  Weight:      Height:         Body mass index is 36.26 kg/m.  Physical Exam    Sedation (propofol and versed) paused x60 min prior to exam  Physical Exam Gen: intubated, no response to noxious stimuli Resp: ventilated CV: bradycardic and regular  Neuro: *MS: intubated, no response to noxious stimuli, does not follow commands *Speech: intubated *CN: pupils pinpoint and sluggishly reactive, (+) corneals/oculocephalics/cough/gag *Motor & sensory: no response to noxious stimuli in any extremity. Increased tone LUE *Reflexes: 1+ symm throughout, toes equiv bilat   Labs   CBC:  Recent Labs  Lab 03/08/22 0053 03/08/22 0645  WBC 7.5 7.2  NEUTROABS 4.8 5.3  HGB 11.8* 10.1*  HCT 38.3* 32.5*  MCV 81.7 81.7  PLT 199 119*    Basic Metabolic Panel:  Lab Results  Component Value Date   NA 138 03/08/2022   NA 139 03/08/2022   K 4.4 03/08/2022   K 4.1 03/08/2022   CO2 24 03/08/2022   CO2 25 03/08/2022   GLUCOSE 86 03/08/2022   GLUCOSE 88 03/08/2022   BUN 27 (H) 03/08/2022   BUN 29 (H) 03/08/2022   CREATININE 1.42 (H) 03/08/2022   CREATININE 1.38 (H) 03/08/2022   CALCIUM 7.7 (L) 03/08/2022   CALCIUM 7.9 (L) 03/08/2022   GFRNONAA 56 (L) 03/08/2022   GFRNONAA 58 (L) 03/08/2022   Lipid Panel:  Lab Results  Component Value Date   LDLCALC 95 12/26/2020   HgbA1c:  Lab Results  Component Value Date   HGBA1C 7.1 (H) 02/26/2021   Urine Drug Screen:     Component Value Date/Time   LABOPIA NONE DETECTED 03/08/2022 0117   COCAINSCRNUR NONE DETECTED 03/08/2022 0117   LABBENZ NONE DETECTED 03/08/2022 0117   AMPHETMU NONE DETECTED 03/08/2022 0117   THCU NONE DETECTED 03/08/2022 0117   LABBARB NONE DETECTED 03/08/2022 0117    Alcohol Level No results found for: "ETH"   Impression   This is a 61 year old gentleman with a past medical history significant for right MCA stroke with residual  left-sided hemiparesis and contractures, history of seizures, prior history of V. tach arrest in 2022 with anoxic brain injury and stroke at that point, heart failure, CKD stage III admitted after 2 breakthrough GTCs and requiring intubation for hypoxic respiratory failure and severe encephalopathy. Patient would ideally be admitted to a hospital capable of cEEG however [facility], [facility], [facility], and [facility] are all at capacity and unable to accept the transfer. As such he will be admitted to [facility] for further mgmt.  Recommendations   - Spot EEG today - Continue keppra 1500mg  q 12 hrs - MRI brain wo contrast - If abx are required neuro would prefer alternative to cefepime (can cause encephalopathy and rhythmic abnl on EEG) - Seizure precautions - Will continue to follow  This patient is critically ill and at significant risk of neurological worsening, death and care requires constant monitoring of vital signs, hemodynamics,respiratory and cardiac monitoring, neurological assessment, discussion with family, other specialists and medical decision making of high complexity. I spent 110 minutes of neurocritical care time  in the care of  this patient. This was time spent independent of any time provided by  nurse practitioner or PA.  Su Monks, MD [facility] [phone]  If 7pm- 7am, please page neurology on call as listed in [place].

## 2022-03-08 NOTE — ED Provider Notes (Addendum)
[facility] Provider Note    Event Date/Time   First MD Initiated Contact with Patient 03/08/22 0101     (approximate)   History   Seizures   HPI  Jeremy Browning is a 61 y.o. male with history of CHF, hypertension, diabetes, previous stroke with left-sided deficits, seizures on Keppra who presents to the emergency department from [facility] with EMS for concerns for seizure.  Staff reports he started seizing around midnight.  EMS reports he had a generalized tonic-clonic seizure for about 20 minutes per staff.  EMS gave him 5 mg of intranasal Versed.  He stopped seizing for several minutes and then had another generalized tonic-clonic seizure that lasted about 45 seconds with EMS.  Patient is receiving assisted ventilation on arrival.  He has been gagging intermittently but no vomiting with EMS.  Blood sugar in the 150s.  [facility] staff reports recent therapeutic Keppra level.  He is on 1000 mg twice daily.  History provided by EMS, [facility] staff.    Past Medical History:  Diagnosis Date   CHF (congestive heart failure) (HCC)    Diabetes mellitus type II, non insulin dependent (HCC)    Essential hypertension [name]   h/o Proteus pneumonia (HCC) 02/17/2021   Heart failure (HCC)    Unknown details.  By report no cardiac surgery   Seizures (HCC)    Severe hypoxic-ischemic encephalopathy    Sleep apnea    Stroke [facility])    Venous stasis dermatitis of left lower extremity     Past Surgical History:  Procedure Laterality Date   HARDWARE REMOVAL Left 02/10/2022   Procedure: HARDWARE REMOVAL LEFT TIBIA;  Surgeon: Nadara Mustard, MD;  Location: [facility] OR;  Service: Orthopedics;  Laterality: Left;   I & D EXTREMITY Left 02/10/2022   Procedure: DEBRIDEMENT OF LEFT TIBIA;  Surgeon: Nadara Mustard, MD;  Location: [facility] OR;  Service: Orthopedics;  Laterality: Left;   LEFT HEART CATH AND CORONARY ANGIOGRAPHY N/A 12/25/2020   Procedure:  LEFT HEART CATH AND CORONARY ANGIOGRAPHY;  Surgeon: Marykay Lex, MD;  Location: [facility] INVASIVE CV LAB;  Service: Cardiovascular;  Laterality: N/A; (In setting of Acute Hypoxic and Hypercapnic Respiratory Failure with Flash Pulm Edema/Hypertensive Emergency) - Angiographically minimal CAD.  Moderately reduced LV function with EF of 35 to 45%. LV P-EDP 101/21 mmHg - 30 mmHg   LEFT HEART CATH AND CORONARY ANGIOGRAPHY  09/12/2018   [facility]): Ostial LAD ~20% otherwise normal coronary arteries. LVEDP 20 mmHg;- HTN Emergency related Type II MI>   PEG PLACEMENT N/A 01/11/2021   Procedure: PERCUTANEOUS ENDOSCOPIC GASTROSTOMY (PEG) PLACEMENT;  Surgeon: Midge Minium, MD;  Location: [facility] ENDOSCOPY;  Service: Endoscopy;  Laterality: N/A;   RIGHT HEART CATH N/A 12/25/2020   Procedure: RIGHT HEART CATH;  Surgeon: Marykay Lex, MD;  Location: [facility] INVASIVE CV LAB;  Service: Cardiovascular; R IJ: (Acute Hypoxic and Hypercapnic Respiratory Failure with Flash Pulm Edema/Hypertensive Emergency): CO-CI 6.54-2.82. Mild Secondary Pulmonary Hypertension (PCWP 30 mmHg.  PAP 47/13 mmHg with a mean PAP of 33 mmHg, RAP 22 mmHg in setting of AoP-MAP 97/59 mmHg - 71 mmHg,   TRACHEOSTOMY TUBE PLACEMENT N/A 01/12/2021   Procedure: TRACHEOSTOMY;  Surgeon: Bud Face, MD;  Location: [facility] ORS;  Service: ENT;  Laterality: N/A;   TRANSTHORACIC ECHOCARDIOGRAM  09/11/2018   [facility]: Moderately reduced LVEF ~35%. 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 Mild-mod AI, Trivial MR?TR. LVEDD 6.0 (reduced compared to 2017: EF ~40-45%, LVEDD  5.6   TRANSTHORACIC ECHOCARDIOGRAM  12/27/2020   [facility]: (In Setting of Acute Hypoxic and Hypercapnic Respiratory Failure with Flash Pulm Edema/Hypertensive Emergency)  EF 45-50%. Mildly reduced LVEF with global HK. Moderately dilated LV with Gr 1 DD - mild LA dilation. Normal RV size Fxn.  Normal Vavles   TRANSTHORACIC ECHOCARDIOGRAM  03/07/2021   [facility]: EF improved to 60-65% with Nl LV  Fxn & ~ normal diastolic fxn. Normal RV size & fxn. Mild MR & AI.  Normal LA size.   Unknown      MEDICATIONS:  Prior to Admission medications   Medication Sig Start Date End Date Taking? Authorizing Provider  acetaminophen (TYLENOL) 325 MG tablet Take 650 mg by mouth every 6 (six) hours as needed (pain.).    [provider]  aspirin EC 81 MG tablet Take 81 mg by mouth daily. Swallow whole.    [provider]  atorvastatin (LIPITOR) 80 MG tablet Take 80 mg by mouth at bedtime.    [provider]  carvedilol (COREG) 6.25 MG tablet Take 1 tablet (6.25 mg total) by mouth 2 (two) times daily with a meal. 03/08/21   Rolly Salter, MD  diclofenac Sodium (VOLTAREN) 1 % GEL Apply 2 g topically 4 (four) times daily. 03/08/21   Rolly Salter, MD  FLOMAX 0.4 MG CAPS capsule Take 0.4 mg by mouth daily. 10/12/21   [provider]  furosemide (LASIX) 20 MG tablet Take 20 mg by mouth daily.    [provider]  levETIRAcetam (KEPPRA) 1000 MG tablet Take 1,000 mg by mouth 2 (two) times daily.    [provider]  losartan (COZAAR) 25 MG tablet Take 1 tablet (25 mg total) by mouth daily. 11/09/21   Darlin Priestly, MD  [facility] 17 GM/SCOOP powder Take 17 g by mouth every other day. 11/09/21   [provider]  Nutritional Supplements (FEEDING SUPPLEMENT, NEPRO CARB STEADY,) LIQD Take 237 mLs by mouth 3 (three) times daily between meals. 03/08/21   Rolly Salter, MD  oxyCODONE-acetaminophen (PERCOCET/ROXICET) 5-325 MG tablet Take 1 tablet by mouth every 4 (four) hours as needed. 02/10/22   Nadara Mustard, MD  Potassium Bicarb-Citric Acid (EFFER-K) 10 MEQ TBEF Take 10 mEq by mouth daily.    [provider]  ZOLOFT 25 MG tablet Take 25 mg by mouth daily. 06/02/21   [provider]    Physical Exam   Triage Vital Signs: ED Triage Vitals  Enc Vitals Group     BP 03/08/22 0058 (!) 133/97     Pulse Rate 03/08/22 0058 (!) 102     Resp  03/08/22 0058 15     Temp --      Temp src --      SpO2 03/08/22 0055 100 %     Weight 03/08/22 0102 238 lb 6.4 oz (108.1 kg)     Height --      Head Circumference --      Peak Flow --      Pain Score --      Pain Loc --      Pain Edu? --      Excl. in GC? --     Most recent vital signs: Vitals:   03/08/22 0545 03/08/22 0600  BP:  (!) 139/55  Pulse: (!) 54 (!) 55  Resp: 20 20  Temp: 98 F (36.7 C) 98.1 F (36.7 C)  SpO2: 100% 100%    CONSTITUTIONAL: Patient has his eyes open  but does not follow commands or answer questions.  Appears older than stated age, chronically ill.  Actively vomiting. HEAD: Normocephalic, atraumatic EYES: Conjunctivae clear, pupils appear equal, sclera nonicteric ENT: normal nose; moist mucous membranes, vomit noted in his mouth during intubation NECK: Supple, no meningismus CARD: Regular and tachycardic; S1 and S2 appreciated; no murmurs, no clicks, no rubs, no gallops RESP: Patient hypoxic to 81% on nasal cannula.  Rhonchorous breath sounds diffusely.  Slightly tachypneic. ABD/GI: Nondistended abdomen, no gross blood or melena BACK: The back appears normal EXT: No deformity, no edema SKIN: Normal color for age and race; warm; no rash on exposed skin NEURO: Patient has his eyes open but does not answer questions or follow commands.  His left upper extremity is contracted likely from his previous right MCA infarct.  Intermittently patient has 2 episodes of left gaze preference that lasted about 1 minute.    ED Results / Procedures / Treatments   LABS: (all labs ordered are listed, but only abnormal results are displayed) Labs Reviewed  CBC WITH DIFFERENTIAL/PLATELET - Abnormal; Notable for the following components:      Result Value   Hemoglobin 11.8 (*)    HCT 38.3 (*)    MCH 25.2 (*)    All other components within normal limits  COMPREHENSIVE METABOLIC PANEL - Abnormal; Notable for the following components:   CO2 21 (*)    Glucose, Bld  131 (*)    BUN 31 (*)    Creatinine, Ser 1.69 (*)    Calcium 8.6 (*)    GFR, Estimated 45 (*)    All other components within normal limits  LACTIC ACID, PLASMA - Abnormal; Notable for the following components:   Lactic Acid, Venous 5.0 (*)    All other components within normal limits  LACTIC ACID, PLASMA - Abnormal; Notable for the following components:   Lactic Acid, Venous >9.0 (*)    All other components within normal limits  URINALYSIS, COMPLETE (UACMP) WITH MICROSCOPIC - Abnormal; Notable for the following components:   Color, Urine YELLOW (*)    APPearance CLEAR (*)    Protein, ur 100 (*)    Bacteria, UA RARE (*)    All other components within normal limits  BRAIN NATRIURETIC PEPTIDE - Abnormal; Notable for the following components:   B Natriuretic Peptide 127.0 (*)    All other components within normal limits  LACTIC ACID, PLASMA - Abnormal; Notable for the following components:   Lactic Acid, Venous 3.5 (*)    All other components within normal limits  CBG MONITORING, ED - Abnormal; Notable for the following components:   Glucose-Capillary 127 (*)    All other components within normal limits  TROPONIN I (HIGH SENSITIVITY) - Abnormal; Notable for the following components:   Troponin I (High Sensitivity) 130 (*)    All other components within normal limits  TROPONIN I (HIGH SENSITIVITY) - Abnormal; Notable for the following components:   Troponin I (High Sensitivity) 114 (*)    All other components within normal limits  TROPONIN I (HIGH SENSITIVITY) - Abnormal; Notable for the following components:   Troponin I (High Sensitivity) 806 (*)    All other components within normal limits  RESP PANEL BY RT-PCR (FLU A&B, COVID) ARPGX2  CULTURE, BLOOD (ROUTINE X 2)  CULTURE, BLOOD (ROUTINE X 2)  URINE CULTURE  SARS CORONAVIRUS 2 BY RT PCR  PROTIME-INR  APTT  BLOOD GAS, ARTERIAL  PROCALCITONIN  MAGNESIUM  URINE DRUG SCREEN, QUALITATIVE ([facility] ONLY)  BASIC METABOLIC PANEL   LACTIC ACID, PLASMA     EKG:  EKG Interpretation  Date/Time:  Wednesday March 08 2022 00:52:26 EDT Ventricular Rate:  106 PR Interval:  146 QRS Duration: 163 QT Interval:  398 QTC Calculation: 529 R Axis:   85 Text Interpretation: Sinus tachycardia Probable left ventricular hypertrophy Anterior Q waves, possibly due to LVH Prolonged QT interval Confirmed by Rochele Raring [phone]) on 03/08/2022 1:05:45 AM        EKG Interpretation  Date/Time:  Wednesday March 08 2022 04:46:17 EDT Ventricular Rate:  48 PR Interval:  189 QRS Duration: 163 QT Interval:  555 QTC Calculation: 496 R Axis:   71 Text Interpretation: Sinus bradycardia Ventricular premature complex Left bundle branch block Confirmed by Rochele Raring [phone]) on 03/08/2022 4:48:58 AM         RADIOLOGY: My personal review and interpretation of imaging: Chest x-ray concerning for bilateral aspiration.  Head CT shows no acute abnormality.  I have personally reviewed all radiology reports.   DG Abd Portable 1 View  Result Date: 03/08/2022 CLINICAL DATA:  OG placement. EXAM: PORTABLE ABDOMEN - 1 VIEW COMPARISON:  None Available. FINDINGS: Enteric tube with tip in the body of the stomach. There is moderate stool the visualized colon. Degenerative changes of the spine. No acute osseous pathology. IMPRESSION: Enteric tube with tip in the body of the stomach. Electronically Signed   By: Elgie Collard M.D.   On: 03/08/2022 03:00   CT HEAD WO CONTRAST ( )  Result Date: 03/08/2022 CLINICAL DATA:  Seizure, status epilepticus EXAM: CT HEAD WITHOUT CONTRAST TECHNIQUE: Contiguous axial images were obtained from the base of the skull through the vertex without intravenous contrast. RADIATION DOSE REDUCTION: This exam was performed according to the departmental dose-optimization program which includes automated exposure control, adjustment of the mA and/or kV according to patient size and/or use of iterative reconstruction technique.  COMPARISON:  11/06/2021 FINDINGS: Brain: Stable encephalomalacia involving the right cerebral hemisphere in keeping with a remote large right MCA territory infarct. Compensatory ex vacuo dilation of the right lateral ventricle. Stable encephalomalacia within the left parieto-occipital cortex again in keeping with remote infarct. No evidence of acute intracranial hemorrhage or infarct. No abnormal mass effect or midline shift. No abnormal intra or extra-axial mass lesion or fluid collection. Ventricular size is normal. Cerebellum is unremarkable. Vascular: No asymmetric hyperdense vasculature at the skull base. Skull: Intact Sinuses/Orbits: Mucous retention cysts are seen within the maxillary sinuses bilaterally. Remaining paranasal sinuses are clear. Orbits are unremarkable. Other: Mastoid air cells and middle ear cavities are clear. IMPRESSION: No acute intracranial abnormality. Stable remote infarcts. Electronically Signed   By: Helyn Numbers M.D.   On: 03/08/2022 02:14   DG Chest Port 1 View  Result Date: 03/08/2022 CLINICAL DATA:  Questionable sepsis - evaluate for abnormality Intubation.  Seizure. EXAM: PORTABLE CHEST 1 VIEW COMPARISON:  11/07/2018 FINDINGS: Endotracheal tube tip 3.4 cm from the carina. There is patchy airspace disease throughout the right lung greatest in the infrahilar region. Lung volumes are low. Upper normal heart size, stable mediastinal contours. Aortic atherosclerosis. No pneumothorax or large pleural effusion. The right costophrenic angle is not included in the field of view. IMPRESSION: 1. Endotracheal tube tip 3.4 cm from the carina. 2. Patchy airspace disease throughout the right lung greatest in the infrahilar region. Findings may represent pneumonia, aspiration or less likely asymmetric pulmonary edema. Electronically Signed   By: Narda Rutherford M.D.   On: 03/08/2022 01:28  PROCEDURES:  Critical Care performed: Yes, see critical care procedure note(s)   CRITICAL  CARE Performed by: Rochele Raring   Total critical care time: 65 minutes  Critical care time was exclusive of separately billable procedures and treating other patients.  Critical care was necessary to treat or prevent imminent or life-threatening deterioration.  Critical care was time spent personally by me on the following activities: development of treatment plan with patient and/or surrogate as well as nursing, discussions with consultants, evaluation of patient's response to treatment, examination of patient, obtaining history from patient or surrogate, ordering and performing treatments and interventions, ordering and review of laboratory studies, ordering and review of radiographic studies, pulse oximetry and re-evaluation of patient's condition.   Marland Kitchen1-3 Lead EKG Interpretation  Performed by: Ansen Sayegh, Layla Maw, DO Authorized by: Rondo Spittler, Layla Maw, DO     Interpretation: abnormal     ECG rate:  106   ECG rate assessment: tachycardic     Rhythm: sinus tachycardia     Ectopy: none     Conduction: normal       IMPRESSION / MDM / ASSESSMENT AND PLAN / ED COURSE  I reviewed the triage vital signs and the nursing notes.    Patient here with seizures, respiratory failure, aspiration.  The patient is on the cardiac monitor to evaluate for evidence of arrhythmia and/or significant heart rate changes.   DIFFERENTIAL DIAGNOSIS (includes but not limited to):   Seizures, status epilepticus, sepsis, intracranial hemorrhage, CVA, electrolyte derangement, UTI   Patient's presentation is most consistent with acute presentation with potential threat to life or bodily function.   PLAN: Patient here with generalized tonic-clonic seizure reportedly lasting up to 20 minutes per staff at [facility] healthcare.  The seizure stopped after intranasal Versed with EMS but then had another generalized tonic-clonic seizure lasting about 45 seconds with EMS.  Has now had 2 episodes of left gaze preference  lasting about 1 to 2 minutes.  Second episode stopped after receiving 4 mg of IV Ativan.  Patient hypoxic to 81% on nasal cannula with rhonchorous breath sounds and concerns for aspiration.  Will intubate for airway protection, respiratory failure with hypoxia.  Will give 2 g of IV Keppra.  Will sedate with propofol.  We will obtain CBC, CMP, troponin, BNP, procalcitonin, lactic, COVID swab, urinalysis, head CT, chest x-ray.   MEDICATIONS GIVEN IN ED: Medications  lactated ringers infusion ( Intravenous New Bag/Given 03/08/22 0309)  propofol (DIPRIVAN) 1000 MG/100ML infusion (5 mcg/kg/min  108.1 kg Intravenous New Bag/Given 03/08/22 0130)  etomidate (AMIDATE) injection ( Intravenous Canceled Entry 03/08/22 0100)  succinylcholine (ANECTINE) injection ( Intravenous Canceled Entry 03/08/22 0100)  propofol (DIPRIVAN) 1000 MG/100ML infusion (0 mcg/kg/min  108.1 kg Intravenous Stopped 03/08/22 0259)  midazolam (VERSED) 100 mg/100 mL (1 mg/mL) premix infusion (4 mg/hr Intravenous New Bag/Given 03/08/22 0223)  acetaminophen (TYLENOL) suppository 650 mg (650 mg Rectal Not Given 03/08/22 0351)  fentaNYL in NS (12mcg/ml) infusion-PREMIX (50 mcg/hr Intravenous New Bag/Given 03/08/22 0253)  levETIRAcetam (KEPPRA) IVPB 1500 mg/ 100 mL premix (has no administration in time range)  metroNIDAZOLE (FLAGYL) IVPB 500 mg (0 mg Intravenous Stopped 03/08/22 0550)  ceFEPIme (MAXIPIME) 2 g in sodium chloride 0.9 % 100 mL IVPB (has no administration in time range)  aspirin suppository 300 mg (has no administration in time range)  ondansetron (ZOFRAN) 4 MG/2ML injection (4 mg  Given 03/08/22 0055)  LORazepam (ATIVAN) 2 MG/ML injection (2 mg  Given 03/08/22 0055)  lactated ringers bolus  1,000 mL (0 mLs Intravenous Stopped 03/08/22 0256)    And  lactated ringers bolus 1,000 mL (0 mLs Intravenous Stopped 03/08/22 0426)    And  lactated ringers bolus 1,000 mL (0 mLs Intravenous Stopped 03/08/22 0300)  ceFEPIme (MAXIPIME)  2 g in sodium chloride 0.9 % 100 mL IVPB (0 g Intravenous Stopped 03/08/22 0504)  vancomycin (VANCOREADY) IVPB 2000 mg/400 mL (0 mg Intravenous Stopped 03/08/22 0437)  levETIRAcetam (KEPPRA) IVPB 1000 mg/100 mL premix (0 mg Intravenous Stopped 03/08/22 0125)    Followed by  levETIRAcetam (KEPPRA) IVPB 1000 mg/100 mL premix (0 mg Intravenous Stopped 03/08/22 0155)     ED COURSE: Patient does have a core temperature of 100.3.  Initiated septic work-up as well given concerns for aspiration on exam and also on chest x-ray when reviewed/interpreted by myself and radiologist.  Patient did have a drop in blood pressure with positive pressure ventilation and propofol sedation and therefore had to be switched to Versed and fentanyl.  He has not had any further seizure-like activity on multiple reevaluations.  Labs show no leukocytosis, stable hemoglobin.  Normal electrolytes and glucose.  Chronic kidney disease which is stable.  Troponin slightly elevated likely from demand ischemia.  Lactic elevated at 5.0.  I suspect this is more likely due to 2 seizures than sepsis but he is getting 30 mL/kg IV fluid bolus.  Urine shows no sign of infection.  COVID and flu negative.  Blood gas reassuring.  Head CT reviewed/interpreted by myself and radiologist and shows no acute abnormality.     2:53 AM Pt's blood pressure currently in the 130s/70s with a heart rate in the 70s.  No further seizure-like activity and is breathing over the vent, coughing and gagging intermittently.  Nurse reports he did pick up his right arm off of the bed.  Still not opening eyes or following commands however.  He is on fentanyl and Versed for sedation.   4:50 AM  Pt's heart rate has been in the upper 40s.  Repeat EKG shows left bundle branch block which is old for patient with occasional PVCs but no other abnormality.  We will continue to closely monitor.  Blood pressures are in the 120s systolic with maps greater than 65.   6:05 AM  Pt's  lactic improving.  Suspect lactic elevated due to seizures rather than sepsis given normal procalcitonin but he is getting continued cefepime and Flagyl for aspiration pneumonia.  No further vomiting here.  Continues to be well sedated on fentanyl and Versed with no further seizure-like activity.  Hemodynamically stable.  Troponins continue to rise but no new ischemic change seen on EKG.  Again suspect this is from demand ischemia.  We will hold heparin at this time.  Will give aspirin.  CONSULTS:   2:28 AM  Spoke with Sonda Rumble NP with critical care.  She has discussed the case with Dr. Belia Heman on-call for the ICU.  They feel the patient needs to be admitted to [facility] for continuous EEG.  Will contact carelink.   2:47 AM  Spoke with Dr. Derry Lory with neurology at [facility].  Appreciate his help.  He agrees with continuous EEG and ICU admission.  [facility] confirms that [facility] has one medical ICU bed.  Will discuss with critical care at [facility] for admission.   2:52 AM  Spoke with Dr. Jayme Cloud with critical care who agrees to accept patient for admission.   3:39 AM  Unfortunately there are no ICU beds at  [facility] now.  Discussed with Dr. Jayme Cloud and Dr. Derry Lory.  Neurology has reviewed patient's chart - patient has no previous history of subclinical seizures.  We do not feel strongly that patient needs to be emergently transferred ED to ED for continuous EEG esp given significant boarding and volume in the waiting room at [facility].  We will have a neurologist here at 8 AM who can evaluate the patient to determine if transfer is still needed or if patient could stay here at [facility] regional.  Patient is still on the waiting list for ICU bed at [facility].  We will continue to monitor patient closely for any changes.  We will continue Keppra 1500 mg IV every 12 hours per neurology recommendations.  We will also continue antibiotics for aspiration pneumonia.  6:25 AM  Updated patient's sister by  phone.    OUTSIDE RECORDS REVIEWED: Reviewed patient's recent neurology note with Dr. Sherryll Burger on 12/09/21.       FINAL CLINICAL IMPRESSION(S) / ED DIAGNOSES   Final diagnoses:  Seizure (HCC)  Acute respiratory failure with hypoxia (HCC)  Aspiration pneumonia of both lungs due to vomit, unspecified part of lung (HCC)     Rx / DC Orders   ED Discharge Orders     None        Note:  This document was prepared using Dragon voice recognition software and may include unintentional dictation errors.   Shon Indelicato, Layla Maw, DO 03/08/22 1610    Leiani Enright, Layla Maw, DO 03/08/22 [phone]

## 2022-03-08 NOTE — Sepsis Progress Note (Signed)
Attempted to clarify antibiotic administration time with bedside RN via secure chat, no response.

## 2022-03-08 NOTE — ED Notes (Signed)
Pt continues to be comfortable sedated.  Medications checked/verified.

## 2022-03-08 NOTE — Consult Note (Signed)
Pharmacy Antibiotic Note  Jeremy Browning is a 61 y.o. male w/ h/o CHF (EF 4-45%; no RWMA; mild LVH), HTN, T2DM, previous stroke with left-sided deficits, seizures on Keppra who presents to the ED from [facility] with EMS for concerns for seizure. Pharmacy consulted for initiation of unasyn ISO suspected aspiration PNA.  Plan: Pt received Vancomycin 2g IV x1, Cefepime 2g IV x1, & Metronidazole 500mg  IV x1 in ED approximately 0430 Initiate Unasyn 3g IV q6h on 7/19 1600 to time from last abx doses.  Height: 5\' 7"  (170.2 cm) Weight: 106.2 kg (234 lb 2.1 oz) IBW/kg (Calculated) : 66.1  Temp (24hrs), Avg:98.5 F (36.9 C), Min:98 F (36.7 C), Max:100.3 F (37.9 C)  Recent Labs  Lab 03/08/22 0053 03/08/22 0054 03/08/22 0236 03/08/22 0453 03/08/22 0645  WBC 7.5  --   --   --  7.2  CREATININE 1.69*  --   --   --  1.38*  1.42*  LATICACIDVEN  --  5.0* >9.0* 3.5* 2.0*    Estimated Creatinine Clearance: 62.6 mL/min (A) (by C-G formula based on SCr of 1.42 mg/dL (H)).    No Known Allergies  Antimicrobials this admission: VAN/CFP/MTZ x1 (7/19) Unasyn (7/19 >>   Dose adjustments this admission: CTM and adjust PRN; pt creatinine is borderline for potential adjust   Microbiology results: 7/19 BCx: NGTD 7/19 UCx: pending  7/19 Trach Asp: pending  7/19 COV/FLU PCR: negative  Thank you for allowing pharmacy to be a part of this patient's care.  8/19 Kristianne Albin 03/08/2022 11:52 AM

## 2022-03-08 NOTE — Progress Notes (Signed)
LTM EEG hooked up and running - no initial skin breakdown - push button tested - neuro notified. [facility] monitoring.  

## 2022-03-08 NOTE — Procedures (Signed)
Routine EEG Report  Jeremy Browning is a 61 y.o. male with a history of seizures who is undergoing an EEG to evaluate for seizures.  Report: This EEG was acquired with electrodes placed according to the International 10-20 electrode system (including Fp1, Fp2, F3, F4, C3, C4, P3, P4, O1, O2, T3, T4, T5, T6, A1, A2, Fz, Cz, Pz). The following electrodes were missing or displaced: none.  The best background was 5-6 Hz with superimposed focal slowing over the right hemisphere. There was no clear waking rhythm nor sleep architecture identified. There was muscle artifact throughout the study that somewhat limited ability to interpret however with that caveat no electrographic seizures or other epileptiform abnormalities were identified.   Impression and clinical correlation: This EEG was obtained while sedated on versed and fentanyl and was abnormal due to moderate diffuse slowing and superimposed focal slowing over the right hemisphere in the region of his remote infarct. Epileptiform abnormalities were not seen during this recording.  Bing Neighbors, MD [facility] [phone]  If 7pm- 7am, please page neurology on call as listed in AMION.

## 2022-03-08 NOTE — ED Notes (Signed)
Sedation paused by Neurology.  Neurologist at bedside.

## 2022-03-08 NOTE — ED Notes (Signed)
Unable to obtain labwork for repeat Trop from several lines.  Main Lab asked to attempt.

## 2022-03-08 NOTE — ED Notes (Signed)
Called [facility] spoke to [place] they have no ICU beds available [phone]

## 2022-03-08 NOTE — Progress Notes (Signed)
Pt transported on vent with RN to ICU without incident

## 2022-03-08 NOTE — ED Notes (Signed)
Informed RN bed assigned 

## 2022-03-08 NOTE — Consult Note (Signed)
PHARMACY CONSULT NOTE - FOLLOW UP  Pharmacy Consult for Electrolyte Monitoring and Replacement   Recent Labs: Potassium (mmol/L)  Date Value  03/08/2022 4.4  03/08/2022 4.1   Magnesium (mg/dL)  Date Value  67/07/4579 2.0   Calcium (mg/dL)  Date Value  99/83/3825 7.7 (L)  03/08/2022 7.9 (L)   Albumin (g/dL)  Date Value  05/39/7673 3.7   Phosphorus (mg/dL)  Date Value  41/93/7902 4.3   Sodium (mmol/L)  Date Value  03/08/2022 138  03/08/2022 139     Assessment: 61 yo M w/ h/o CHF (EF 4-45%; no RWMA; mild LVH), HTN, T2DM, previous stroke with left-sided deficits, seizures on Keppra who presents to the ED from [facility] with EMS for concerns for seizure. Pharmacy consulted for mgmt of electrolytes  MIVF: LR @75ml /h (in addition to 3L LR boluses in ED)  Goal of Therapy:  Lytes WNL  Plan:  Scr 1.69>1.38 K 4.3>4.4 Mg 2 > NNL Next labs with AM labs 0500.  ,PharmD Clinical Pharmacist 03/08/2022 10:38 AM

## 2022-03-08 NOTE — ED Notes (Signed)
RT notified Pt is ready for transport.

## 2022-03-08 NOTE — Sepsis Progress Note (Signed)
Notified provider of need to order repeat lactic acid. ° °

## 2022-03-08 NOTE — ED Notes (Signed)
[facility] was contacted and [place] was told that pt had been intubated and would be transferred to [facility]. [place] said that family had been notified that pt has been sent out to hospital. Provider asked if family was aware of intubation and transfer. This nurse said most likely not and that there was a number in the chart for pt's sister.

## 2022-03-08 NOTE — Sepsis Progress Note (Signed)
Monitoring for the code sepsis protocol. °

## 2022-03-08 NOTE — Progress Notes (Signed)
EEG complete - results pending 

## 2022-03-09 ENCOUNTER — Inpatient Hospital Stay (HOSPITAL_COMMUNITY): Payer: Medicaid Other

## 2022-03-09 ENCOUNTER — Other Ambulatory Visit: Payer: Medicaid Other

## 2022-03-09 DIAGNOSIS — G40901 Epilepsy, unspecified, not intractable, with status epilepticus: Secondary | ICD-10-CM

## 2022-03-09 DIAGNOSIS — R0602 Shortness of breath: Secondary | ICD-10-CM | POA: Diagnosis not present

## 2022-03-09 DIAGNOSIS — R569 Unspecified convulsions: Secondary | ICD-10-CM | POA: Diagnosis not present

## 2022-03-09 LAB — BLOOD GAS, VENOUS
Acid-Base Excess: 0.8 mmol/L (ref 0.0–2.0)
Bicarbonate: 24.2 mmol/L (ref 20.0–28.0)
O2 Saturation: 77.9 %
Patient temperature: 37
pCO2, Ven: 34 mmHg — ABNORMAL LOW (ref 44–60)
pH, Ven: 7.46 — ABNORMAL HIGH (ref 7.25–7.43)
pO2, Ven: 47 mmHg — ABNORMAL HIGH (ref 32–45)

## 2022-03-09 LAB — GLUCOSE, CAPILLARY
Glucose-Capillary: 151 mg/dL — ABNORMAL HIGH (ref 70–99)
Glucose-Capillary: 130 mg/dL — ABNORMAL HIGH (ref 70–99)
Glucose-Capillary: 92 mg/dL (ref 70–99)
Glucose-Capillary: 90 mg/dL (ref 70–99)
Glucose-Capillary: 113 mg/dL — ABNORMAL HIGH (ref 70–99)
Glucose-Capillary: 92 mg/dL (ref 70–99)

## 2022-03-09 LAB — CBC
HCT: 34.1 % — ABNORMAL LOW (ref 39.0–52.0)
Hemoglobin: 10.8 g/dL — ABNORMAL LOW (ref 13.0–17.0)
MCH: 25.7 pg — ABNORMAL LOW (ref 26.0–34.0)
MCHC: 31.7 g/dL (ref 30.0–36.0)
MCV: 81.2 fL (ref 80.0–100.0)
Platelets: 173 10*3/uL (ref 150–400)
RBC: 4.2 MIL/uL — ABNORMAL LOW (ref 4.22–5.81)
RDW: 14.4 % (ref 11.5–15.5)
WBC: 8.8 10*3/uL (ref 4.0–10.5)
nRBC: 0 % (ref 0.0–0.2)

## 2022-03-09 LAB — ECHOCARDIOGRAM LIMITED
Calc EF: 52.9 %
Height: 67 in
S' Lateral: 4 cm
Single Plane A2C EF: 51.9 %
Single Plane A4C EF: 54.5 %
Weight: 3890.68 oz

## 2022-03-09 LAB — COOXEMETRY PANEL
Carboxyhemoglobin: 1.6 % — ABNORMAL HIGH (ref 0.5–1.5)
Methemoglobin: <0.7 % (ref 0.0–1.5)
O2 Saturation: 78.8 %
Total hemoglobin: 10.7 g/dL — ABNORMAL LOW (ref 12.0–16.0)

## 2022-03-09 LAB — URINE CULTURE: Culture: <10000 — AB

## 2022-03-09 LAB — MAGNESIUM: Magnesium: 1.9 mg/dL (ref 1.7–2.4)

## 2022-03-09 LAB — PHOSPHORUS: Phosphorus: 2.5 mg/dL (ref 2.5–4.6)

## 2022-03-09 MED ORDER — VALPROATE SODIUM 100 MG/ML IV SOLN
10.0000 mg/kg/d | Freq: Four times a day (QID) | INTRAVENOUS | Status: DC
  Administered 2022-03-09 – 2022-03-10 (×4): 210 mg via INTRAVENOUS
  Filled 2022-03-09 (×5): qty 2.1

## 2022-03-09 MED ORDER — FENTANYL CITRATE PF 50 MCG/ML IJ SOSY: 25.0000 ug | PREFILLED_SYRINGE | INTRAMUSCULAR | Status: DC | PRN

## 2022-03-09 MED ORDER — POTASSIUM & SODIUM PHOSPHATES 280-160-250 MG PO PACK
1.0000 | PACK | Freq: Three times a day (TID) | ORAL | Status: AC
  Administered 2022-03-09 (×3): 1
  Filled 2022-03-09 (×3): qty 1

## 2022-03-09 MED ORDER — VALPROATE SODIUM 100 MG/ML IV SOLN
840.0000 mg | Freq: Once | INTRAVENOUS | Status: AC
  Administered 2022-03-09: 840 mg via INTRAVENOUS
  Filled 2022-03-09: qty 8.4

## 2022-03-09 MED ORDER — FENTANYL CITRATE PF 50 MCG/ML IJ SOSY
25.0000 ug | PREFILLED_SYRINGE | INTRAMUSCULAR | Status: DC | PRN
  Administered 2022-03-09 – 2022-03-10 (×2): 25 ug via INTRAVENOUS
  Filled 2022-03-09 (×2): qty 1

## 2022-03-09 MED ORDER — MAGNESIUM SULFATE 2 GM/50ML IV SOLN
2.0000 g | Freq: Once | INTRAVENOUS | Status: AC
  Administered 2022-03-09: 2 g via INTRAVENOUS
  Filled 2022-03-09: qty 50

## 2022-03-09 NOTE — Progress Notes (Signed)
  Echocardiogram 2D Echocardiogram has been performed.  Jeremy Browning 03/09/2022, 10:15 AM

## 2022-03-09 NOTE — Progress Notes (Signed)
eLink Physician-Brief Progress Note Patient Name: Raylan Hanton DOB: May 21, 1961 MRN: [id number]   Date of Service  03/09/2022  HPI/Events of Note  Agitation - Ventilator asynchrony.  eICU Interventions  Plan: Fentanyl 25 mcg IV Q 4 hours PRN pain, agitation or ventilator asynchrony.      Intervention Category Major Interventions: Delirium, psychosis, severe agitation - evaluation and management  Taylynn Easton Eugene 03/09/2022, 11:09 PM

## 2022-03-09 NOTE — Progress Notes (Signed)
eLink Physician-Brief Progress Note Patient Name: Jeremy Browning DOB: Jul 22, 1961 MRN: [id number]   Date of Service  03/09/2022  HPI/Events of Note  Nursing reports that they had to replace the patient's NGT. Request for abdominal x-ray to verify placement.   eICU Interventions  Plan: Portable abdominal film now.      Intervention Category Major Interventions: Other:  Lenell Antu 03/09/2022, 8:15 PM

## 2022-03-09 NOTE — Plan of Care (Signed)
  Problem: Safety: Goal: Ability to remain free from injury will improve Outcome: Progressing   Problem: Activity: Goal: Ability to tolerate increased activity will improve Outcome: Not Progressing   Problem: Role Relationship: Goal: Method of communication will improve Outcome: Not Progressing

## 2022-03-09 NOTE — Progress Notes (Signed)
75 ml of fentanyl bag wasted with RN Renold Don in stericycle.

## 2022-03-09 NOTE — Progress Notes (Signed)
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 tach arrest in 2022 with anoxic brain injury and stroke at that point, heart failure, CKD stage III admitted with breakthrough seizures.  Patient presented to [facility] from his SNF after a 20 minute tonic-clonic seizure. EMS gave him 5mg  of intranasal versed and the seizure subsided for several minutes and the he had another tonic clonic seizure lasting approx 45 seconds. Upon arrival SpO2 was 815. He was intubated in the ED due to aspiration and for hypoxic respiratory failure. He became bradycardic and hypotensive requiring atropine and norepinephrine. Troponin was also noted to be elevated initially and is now trending down. He was transferred to [facility] for cEEG.   Subjective: Seen with PCCM NP and RN at the bedside. Sedation paused for exam. Patient is lethargic, but does slowly respond to commands and open eyes to noxious stimuli. He was on versed and fentanyl. Versed was started at 1720 and remained at 6-7mg /hr throughout the night. Fentanyl was started at 1718 and was at 182mcg/hr throughout the night.   Exam: Vitals:   03/09/22 0815 03/09/22 0830  BP: (!) 111/28 (!) 92/33  Pulse: (!) 58 (!) 53  Resp: 16 16  Temp: 99.3 F (37.4 C) 99.3 F (37.4 C)  SpO2: 99% 100%   Gen: In bed, sedated  Resp: mechanically ventilated  Abd: soft, nt  Neuro: MS: lethargic, opens eyes to noxious stimuli. Does follow commands slowly (thumbs up, wiggles toes) CN: pupils are pinpoint, equal and reactive. Corneal intact. cough and gag weak, head is midline  Motor: moves all extremities antigravity, contracted LUE and L hands increased tone on the left, Sensory: reacts in all 4 but less brisk on the left, bulk is normal  DTR:1+ and symmetrical throughout  Pertinent Labs: Na 138 Cr 1.53 Trop 351 ->  improving UA negative   Basic Metabolic Panel: Recent Labs  Lab 03/08/22 0053 03/08/22 0645 03/08/22 1819 03/08/22 1827 03/09/22 0515  NA 137 139  138 137 138  --   K 4.3 4.1  4.4 3.8 3.8  --   CL 107 112*  111  --  107  --   CO2 21* 25  24  --  23  --   GLUCOSE 131* 88  86  --  163*  --   BUN 31* 29*  27*  --  24*  --   CREATININE 1.69* 1.38*  1.42*  --  1.53*  --   CALCIUM 8.6* 7.9*  7.7*  --  8.3*  --   MG 2.0  --   --  1.7 1.9  PHOS  --   --   --  3.2 2.5    CBC: Recent Labs  Lab 03/08/22 0053 03/08/22 0645 03/08/22 1819 03/08/22 1827 03/09/22 0515  WBC 7.5 7.2  --  9.3 8.8  NEUTROABS 4.8 5.3  --   --   --   HGB 11.8* 10.1* 11.9* 11.3* 10.8*  HCT 38.3* 32.5* 35.0* 36.3* 34.1*  MCV 81.7 81.7  --  81.8 81.2  PLT 199 119*  --  185 173    Coagulation Studies: Recent Labs    03/08/22 0055  LABPROT 14.3  INR 1.1     cEEG 7/19-7/20 read pending  UA negative, CXR with minimal c/f infection  Assessment: This is a 61 year old gentleman with a past medical history significant  for 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 tach arrest in 2022 with anoxic brain injury and stroke at that point, heart failure, CKD stage III admitted after 2 breakthrough GTCs and requiring intubation for hypoxic respiratory failure and severe encephalopathy.  Question of aspiration, but unclear if that preceded seizure, and therefore I cannot identify a clear inciting factor for his breakthrough seizure. In this setting, he needs a second antiseizure medication to help wean the Versed safely.  Given his cardiac history and left bundle branch block, Vimpat is not a good option.  I will start Depakote with a 10 mg/kg load followed by 10 mg/kg daily dose divided every 6 hours  Recommendations: - Depakote 830 mg once followed by 830 mg daily divided 4 times daily; trough to be checked in a few days - Pending EEG  read will try to wean Versed today - Appreciate ventilator management per CCM  Patient seen and examined by NP/APP with MD. MD to update note as needed.   Elmer Picker, DNP, FNP-BC [facility] Pager: [phone]  Brooke Dare MD-PhD [facility] [phone]   CRITICAL CARE Performed by: Gordy Councilman   Total critical care time: 35 minutes  Critical care time was exclusive of separately billable procedures and treating other patients.  Critical care was necessary to treat or prevent imminent or life-threatening deterioration.  Critical care was time spent personally by me on the following activities: development of treatment plan with patient and/or surrogate as well as nursing, discussions with consultants, evaluation of patient's response to treatment, examination of patient, obtaining history from patient or surrogate, ordering and performing treatments and interventions, ordering and review of laboratory studies, ordering and review of radiographic studies, pulse oximetry and re-evaluation of patient's condition.

## 2022-03-09 NOTE — Progress Notes (Signed)
eLink Physician-Brief Progress Note Patient Name: Jeremy Browning DOB: 03-07-1961 MRN: [id number]   Date of Service  03/09/2022  HPI/Events of Note  Review of abdominal film reveals gastric tube is looped in the stomach with the tip positioned over the body of the stomach.  eICU Interventions  OK to use gastric tube.      Intervention Category Major Interventions: Other:  Lenell Antu 03/09/2022, 10:22 PM

## 2022-03-09 NOTE — Progress Notes (Signed)
OG tube found with external length of 55 cm, replaced to 39 cm.

## 2022-03-09 NOTE — Progress Notes (Signed)
NAME:  Jeremy Browning, MRN:  [id number], DOB:  [birth year], LOS: 1 ADMISSION DATE:  03/08/2022, CONSULTATION DATE:  7/19 REFERRING MD:  Dr. 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 He had a prolonged course which included trach and peg, but he has since been able to liberate from both. He resides in SNF where he developed seizures in the early AM hours of 7/19. Upon EMS arrival the seizure was still in process (~20 minutes) with tonic clonic movements. He was given intranasal versed with cessation of sz, but shortly after began seizing again. Upon arrival to ED he was intubated for airway protection and hypoxia with sats of 81% on room air. CT head was non-acute. He was admitted to the ICU service for status epilepticus. Spot EEG negative. Course complicated by bradycardia requiring atropine, hypotension requiring norepinephrine, and elevated troponin which plateaued and has since began to downtrend. The patient was transferred to [facility] for continuous EEG.   Pertinent  Medical History   has a past medical history of CHF (congestive heart failure) (HCC), Diabetes mellitus type II, non insulin dependent (HCC), Essential hypertension Irving Burton like), h/o Proteus pneumonia (HCC) (02/17/2021), Heart failure (HCC), Seizures (HCC), Severe hypoxic-ischemic encephalopathy, Sleep apnea, Stroke (HCC), and Venous stasis dermatitis of left lower extremity.   Significant Hospital Events: Including procedures, antibiotic start and stop dates in addition to other pertinent events   7/18 admitted to [facility] for status epilepticus. Tx to [facility] for LTM 7/20 no further sz. Turning off sedation to assess for SBT   Interim History / Subjective:   No further sz On 9 NE 100 fent 6 versed   Objective   Blood pressure (!)  92/33, pulse (!) 53, temperature 99.3 F (37.4 C), resp. rate 16, weight 110.3 kg, SpO2 100 %.    Vent Mode: PRVC FiO2 (%):  [30 %-35 %] 30 % Set Rate:  [16 bmp] 16 bmp Vt Set:  [500 mL-520 mL] 520 mL PEEP:  [5 cmH20] 5 cmH20 Plateau Pressure:  [19 cmH20-24 cmH20] 19 cmH20   Intake/Output Summary (Last 24 hours) at 03/09/2022 0909 Last data filed at 03/09/2022 0800 Gross per 24 hour  Intake 1456.07 ml  Output 780 ml  Net 676.07 ml   Filed Weights   03/09/22 0500  Weight: 110.3 kg    Examination: General: chronically and critically ill. Intubated. Sedated.  HENT: NCAT. ETT secure. Anicteric sclera  Lungs: CTAb, symmetrical chest expansion. Mechanically ventilated.  Cardiovascular: bradycardic. S1s2. Cap refill < 3 sec  Abdomen: Obese round ndnt + bowel sounds x4 Extremities: No acute joint deformity. Lower extremity edema  Neuro: Drowsy, awakens to voice and follows simple commands. L sided weakness. Pinpoint pupils GU: clear yellow urine   Resolved Hospital Problem list     Assessment & Plan:   Status Epilepticus, improved  Hx Seizure disorders  -hx sz, on keppra. Presented to [facility] with tonic clonic sz  P -Appreciate neuro following, Keppra per neuro -cont cEEG -shut off sedation   Acute hypoxic respiratory failure  Aspiration event  P -WUA/SBT -hopefully extubate 7/20  -cont unasyn for aspiration -- will set for 3d course   Hypotension   Hx chronic HFrEF NSTEMI -trops down trending P -ECHO pending -wean NE, goal MAP > 65   AKI on CKD III -  making great UOP -will get AM labs   DM II - CBG monitoring and SSI  Hypomagnesemia -replace   Best Practice (right click and "Reselect all SmartList Selections" daily)   Diet/type: NPO DVT prophylaxis: prophylactic heparin  GI prophylaxis: PPI Lines: Central line Foley:  Yes, and it is still needed -- maybe change to condom cath 7/20 Code Status:  full code Last date of multidisciplinary goals of care  discussion [ ]   Labs   CBC: Recent Labs  Lab 03/08/22 0053 03/08/22 0645 03/08/22 1819 03/08/22 1827 03/09/22 0515  WBC 7.5 7.2  --  9.3 8.8  NEUTROABS 4.8 5.3  --   --   --   HGB 11.8* 10.1* 11.9* 11.3* 10.8*  HCT 38.3* 32.5* 35.0* 36.3* 34.1*  MCV 81.7 81.7  --  81.8 81.2  PLT 199 119*  --  185 173    Basic Metabolic Panel: Recent Labs  Lab 03/08/22 0053 03/08/22 0645 03/08/22 1819 03/08/22 1827 03/09/22 0515  NA 137 139  138 137 138  --   K 4.3 4.1  4.4 3.8 3.8  --   CL 107 112*  111  --  107  --   CO2 21* 25  24  --  23  --   GLUCOSE 131* 88  86  --  163*  --   BUN 31* 29*  27*  --  24*  --   CREATININE 1.69* 1.38*  1.42*  --  1.53*  --   CALCIUM 8.6* 7.9*  7.7*  --  8.3*  --   MG 2.0  --   --  1.7 1.9  PHOS  --   --   --  3.2 2.5   GFR: Estimated Creatinine Clearance: 59.3 mL/min (A) (by C-G formula based on SCr of 1.53 mg/dL (H)). Recent Labs  Lab 03/08/22 0053 03/08/22 0054 03/08/22 0236 03/08/22 0453 03/08/22 0645 03/08/22 1827 03/09/22 0515  PROCALCITON <0.10  --   --   --   --   --   --   WBC 7.5  --   --   --  7.2 9.3 8.8  LATICACIDVEN  --  5.0* >9.0* 3.5* 2.0*  --   --     Liver Function Tests: Recent Labs  Lab 03/08/22 0053 03/08/22 1827  AST 35 31  ALT 32 28  ALKPHOS 86 72  BILITOT 0.5 0.6  PROT 7.5 6.3*  ALBUMIN 3.7 2.8*   No results for input(s): "LIPASE", "AMYLASE" in the last 168 hours. No results for input(s): "AMMONIA" in the last 168 hours.  ABG    Component Value Date/Time   PHART 7.371 03/08/2022 1819   PCO2ART 39.6 03/08/2022 1819   PO2ART 115 (H) 03/08/2022 1819   HCO3 23.1 03/08/2022 1819   TCO2 24 03/08/2022 1819   ACIDBASEDEF 2.0 03/08/2022 1819   O2SAT 98 03/08/2022 1819     Coagulation Profile: Recent Labs  Lab 03/08/22 0055  INR 1.1    Cardiac Enzymes: No results for input(s): "CKTOTAL", "CKMB", "CKMBINDEX", "TROPONINI" in the last 168 hours.  HbA1C: Hgb A1c MFr Bld  Date/Time Value Ref  Range Status  02/26/2021 06:46 PM 7.1 (H) 4.8 - 5.6 % Final    Comment:    (NOTE) Pre diabetes:          5.7%-6.4%  Diabetes:              >6.4%  Glycemic control for   <7.0% adults with diabetes   12/25/2020  04:46 PM 5.9 (H) 4.8 - 5.6 % Final    Comment:    (NOTE) Pre diabetes:          5.7%-6.4%  Diabetes:              >6.4%  Glycemic control for   <7.0% adults with diabetes     CBG: Recent Labs  Lab 03/08/22 1321 03/08/22 1938 03/08/22 2322 03/09/22 0328 03/09/22 0805  GLUCAP 96 122* 145* 151* 130*   CRITICAL CARE Performed by: Lanier Clam   Total critical care time: 40 minutes  Critical care time was exclusive of separately billable procedures and treating other patients. Critical care was necessary to treat or prevent imminent or life-threatening deterioration.  Critical care was time spent personally by me on the following activities: development of treatment plan with patient and/or surrogate as well as nursing, discussions with consultants, evaluation of patient's response to treatment, examination of patient, obtaining history from patient or surrogate, ordering and performing treatments and interventions, ordering and review of laboratory studies, ordering and review of radiographic studies, pulse oximetry and re-evaluation of patient's condition.  Tessie Fass MSN, AGACNP-BC [facility] Pulmonary/Critical Care Medicine Amion for pager 03/09/2022, 9:09 AM

## 2022-03-09 NOTE — Plan of Care (Signed)
  Problem: Respiratory: Goal: Ability to maintain a clear airway and adequate ventilation will improve Outcome: Progressing   

## 2022-03-09 NOTE — Progress Notes (Signed)
LTM maint complete - no skin breakdown under:  FP1 FP2 F4

## 2022-03-09 NOTE — Progress Notes (Signed)
NP Tessie Fass made aware of pt's sustained HR in the 40s while resting. EKG ordered and obtained.

## 2022-03-09 NOTE — Procedures (Addendum)
Patient Name: Jeremy Browning  MRN: [id number]  Epilepsy Attending: Charlsie Quest  Referring Physician/Provider: Duayne Cal, NP  Duration: 03/08/2022 2009 to 03/09/2022 2009  Patient history: 61 y.o. male with a history of seizures who is undergoing an EEG to evaluate for seizures.   Level of alertness:  lethargic/sedated  AEDs during EEG study: LEV, VPA, versed  Technical aspects: This EEG study was done with scalp electrodes positioned according to the 10-20 International system of electrode placement. Electrical activity was acquired at a sampling rate of 500Hz  and reviewed with a high frequency filter of 70Hz  and a low frequency filter of 1Hz . EEG data were recorded continuously and digitally stored.   Description: EEG showed continuous 3 to 7 Hz theta- delta slowing admixed with 12 to 14 Hz beta activity in left hemisphere.  There is also 3 to 5 Hz theta-delta slowing in right hemisphere.  Hyperventilation and photic stimulation were not performed.     ABNORMALITY -Continuous slow, generalized and lateralized right hemisphere  IMPRESSION: This study is suggestive of cortical dysfunction in right hemisphere likely secondary to underlying structural abnormality/stroke.  Additionally there is moderate diffuse encephalopathy, nonspecific etiology. No seizures or epileptiform discharges were seen throughout the recording.  Alaa Mullally 

## 2022-03-10 ENCOUNTER — Inpatient Hospital Stay (HOSPITAL_COMMUNITY): Payer: Medicaid Other

## 2022-03-10 DIAGNOSIS — R569 Unspecified convulsions: Secondary | ICD-10-CM | POA: Diagnosis not present

## 2022-03-10 LAB — BASIC METABOLIC PANEL
Anion gap: 8 (ref 5–15)
BUN: 14 mg/dL (ref 8–23)
CO2: 24 mmol/L (ref 22–32)
Calcium: 8 mg/dL — ABNORMAL LOW (ref 8.9–10.3)
Chloride: 108 mmol/L (ref 98–111)
Creatinine, Ser: 1.34 mg/dL — ABNORMAL HIGH (ref 0.61–1.24)
GFR, Estimated: 60 mL/min — ABNORMAL LOW (ref >60)
Glucose, Bld: 98 mg/dL (ref 70–99)
Potassium: 3.7 mmol/L (ref 3.5–5.1)
Sodium: 140 mmol/L (ref 135–145)

## 2022-03-10 LAB — CBC
HCT: 32.2 % — ABNORMAL LOW (ref 39.0–52.0)
Hemoglobin: 10.1 g/dL — ABNORMAL LOW (ref 13.0–17.0)
MCH: 25.1 pg — ABNORMAL LOW (ref 26.0–34.0)
MCHC: 31.4 g/dL (ref 30.0–36.0)
MCV: 80.1 fL (ref 80.0–100.0)
Platelets: 142 10*3/uL — ABNORMAL LOW (ref 150–400)
RBC: 4.02 MIL/uL — ABNORMAL LOW (ref 4.22–5.81)
RDW: 14.3 % (ref 11.5–15.5)
WBC: 6.7 10*3/uL (ref 4.0–10.5)
nRBC: 0 % (ref 0.0–0.2)

## 2022-03-10 LAB — GLUCOSE, CAPILLARY
Glucose-Capillary: 72 mg/dL (ref 70–99)
Glucose-Capillary: 83 mg/dL (ref 70–99)
Glucose-Capillary: 87 mg/dL (ref 70–99)
Glucose-Capillary: 88 mg/dL (ref 70–99)
Glucose-Capillary: 78 mg/dL (ref 70–99)

## 2022-03-10 LAB — MAGNESIUM: Magnesium: 2.1 mg/dL (ref 1.7–2.4)

## 2022-03-10 LAB — PHOSPHORUS: Phosphorus: 3.9 mg/dL (ref 2.5–4.6)

## 2022-03-10 MED ORDER — LEVETIRACETAM 100 MG/ML PO SOLN
1500.0000 mg | Freq: Two times a day (BID) | ORAL | Status: DC
  Administered 2022-03-10 – 2022-03-13 (×6): 1500 mg
  Filled 2022-03-10 (×6): qty 15

## 2022-03-10 MED ORDER — ONDANSETRON HCL 4 MG/2ML IJ SOLN
4.0000 mg | Freq: Four times a day (QID) | INTRAMUSCULAR | Status: DC
  Administered 2022-03-10 – 2022-03-12 (×8): 4 mg via INTRAVENOUS
  Filled 2022-03-10 (×7): qty 2

## 2022-03-10 MED ORDER — POTASSIUM CHLORIDE 20 MEQ PO PACK
40.0000 meq | PACK | Freq: Once | ORAL | Status: AC
  Administered 2022-03-10: 40 meq
  Filled 2022-03-10: qty 2

## 2022-03-10 MED ORDER — PROSOURCE TF PO LIQD
45.0000 mL | Freq: Three times a day (TID) | ORAL | Status: DC
  Administered 2022-03-10 – 2022-03-13 (×9): 45 mL
  Filled 2022-03-10 (×8): qty 45

## 2022-03-10 MED ORDER — ONDANSETRON HCL 4 MG/2ML IJ SOLN
INTRAMUSCULAR | Status: AC
  Filled 2022-03-10: qty 2

## 2022-03-10 MED ORDER — OSMOLITE 1.5 CAL PO LIQD
1000.0000 mL | ORAL | Status: DC
  Administered 2022-03-10 (×2): 1000 mL
  Filled 2022-03-10: qty 1000

## 2022-03-10 MED ORDER — SENNOSIDES-DOCUSATE SODIUM 8.6-50 MG PO TABS
1.0000 | ORAL_TABLET | Freq: Two times a day (BID) | ORAL | Status: DC
  Administered 2022-03-10 – 2022-03-13 (×7): 1
  Filled 2022-03-10 (×7): qty 1

## 2022-03-10 MED ORDER — VALPROIC ACID 250 MG/5ML PO SOLN
250.0000 mg | Freq: Four times a day (QID) | ORAL | Status: DC
Start: 2022-03-10 — End: 2022-03-13
  Administered 2022-03-10 – 2022-03-13 (×13): 250 mg
  Filled 2022-03-10 (×15): qty 5

## 2022-03-10 MED ORDER — SENNOSIDES 8.8 MG/5ML PO SYRP: 10.0000 mL | ORAL_SOLUTION | Freq: Two times a day (BID) | ORAL | Status: DC

## 2022-03-10 NOTE — Progress Notes (Signed)
Initial Nutrition Assessment  DOCUMENTATION CODES:   Obesity unspecified  INTERVENTION:   Initiate tube feeding via Cortrak: Osmolite 1.5 at 20 ml/h and increase by 10 ml every 6 hours to goal rate of 60 ml/hr (1440 ml per day) Prosource TF 45 ml TID  Provides 2280 kcal, 123 gm protein, 1094 ml free water daily  Phosphorus and Magnesium are currently WNL, monitoring as TF initiated and advanced to goal per protocol   NUTRITION DIAGNOSIS:   Inadequate oral intake related to inability to eat as evidenced by NPO status.  GOAL:   Patient will meet greater than or equal to 90% of their needs  MONITOR:   Diet advancement, TF tolerance  REASON FOR ASSESSMENT:   Consult Enteral/tube feeding initiation and management  ASSESSMENT:   Pt with PMH of CHF, DM, HTN, CHF, Sz, stroke R MCA stroke with L hemiparesis and contractures, pt with cardiac arrest complicated by ABI 2022 s/p trach/PEG which have since been removed pt now admitted from his SNF with seizures.   Spoke with RN and NP. Pt remains intubated but plans to extubated after cortrak placement.  Pt with mitten on R hand to protect from pulling lines. L side with hemiparesis.  Pt unable to answer questions but does appear frustrated.  Per xray gas noted. Per NP ok to start TF.   7/21 s/p cortrak placement; tip in stomach   Medications reviewed and include: SSI, miralax, senokot-s  Keppra Mag sulfate x 2 (7/19, 7/20) Labs reviewed     NUTRITION - FOCUSED PHYSICAL EXAM:  Flowsheet Row Most Recent Value  Orbital Region No depletion  Upper Arm Region No depletion  Thoracic and Lumbar Region No depletion  Buccal Region No depletion  Temple Region No depletion  Clavicle Bone Region No depletion  Clavicle and Acromion Bone Region No depletion  Scapular Bone Region No depletion  Dorsal Hand No depletion  Patellar Region No depletion  Anterior Thigh Region No depletion  Posterior Calf Region No depletion  Edema (RD  Assessment) Mild  Hair Reviewed  Eyes Reviewed  Mouth Reviewed  Skin Reviewed  Nails Reviewed       Diet Order:   Diet Order             Diet NPO time specified  Diet effective now                   EDUCATION NEEDS:   Not appropriate for education at this time  Skin:     Last BM:  unknown  Height:   Ht Readings from Last 1 Encounters:  03/08/22 5\' 7"  (1.702 m)  Pt is 6' per previous admission to [facility]   Weight:   Wt Readings from Last 1 Encounters:  03/10/22 109.6 kg   BMI:  Body mass index is 37.84 kg/m.  Estimated Nutritional Needs:   Kcal:  2200-2500  Protein:  110-125 grams  Fluid:  >2 L/day  03/12/22., RD, LDN, CNSC See AMiON for contact information

## 2022-03-10 NOTE — Progress Notes (Signed)
[facility] ADULT ICU REPLACEMENT PROTOCOL   The patient does apply for the [facility] Adult ICU Electrolyte Replacment Protocol based on the criteria listed below:   1.Exclusion criteria: TCTS patients, ECMO patients, and Dialysis patients 2. Is GFR >/= 30 ml/min? Yes.    Patient's GFR today is >60 3. Is SCr </= 2? Yes.   Patient's SCr is 1.34 mg/dL 4. Did SCr increase >/= 0.5 in 24 hours? No. 5.Pt's weight >40kg  Yes.   6. Abnormal electrolyte(s):   K 3.7  7. Electrolytes replaced per protocol 8.  Call MD STAT for K+ </= 2.5, Phos </= 1, or Mag </= 1 Physician:  S. Bobbye Morton R Jahayra Mazo 03/10/2022 6:10 AM

## 2022-03-10 NOTE — Progress Notes (Addendum)
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 tach arrest in 2022 with anoxic brain injury and stroke at that point, heart failure, CKD stage III admitted with breakthrough seizures.  Patient presented to [facility] from his SNF after a 20 minute tonic-clonic seizure. EMS gave him 5mg  of intranasal versed and the seizure subsided for several minutes and the he had another tonic clonic seizure lasting approx 45 seconds. Upon arrival SpO2 was in the 80s. He was intubated in the ED due to aspiration and for hypoxic respiratory failure. He became bradycardic and hypotensive requiring atropine and norepinephrine. Troponin was also noted to be elevated initially and is now trending down. He was transferred to [facility] for cEEG.   Subjective: Off of versed since 7/20 AM CCM preparing for extubation  Denies headache, appears uncomfortable  Exam: Vitals:   03/10/22 0800 03/10/22 0830  BP: (!) 146/60 (!) 146/60  Pulse: (!) 56 (!) 55  Resp: (!) 9 14  Temp: 98.2 F (36.8 C)   SpO2: 100% 100%   Gen: In bed, appears uncomfortable, restless Resp: mechanically ventilated  Abd: soft, nt  Neuro:  MS: lethargic, opens eyes to noxious stimuli. Does follow some commands slowly (thumbs up, wiggles toes).  Attempts to speak to examiner but difficult for me to read lips and understand what he is saying.  Examination somewhat limited by his frustration as he keeps attempting to talk instead of answering yes/no questions by shaking his head CN: pupils are equal and reactive. Corneal intact to light eyelash brush.  Left facial droop Motor: Repeatedly localizing to his ET tube at the right upper extremity contracted LUE and L hand increased tone on the left, Sensory: reacts in all 4 but less brisk on the left, bulk is normal  DTR:1+ and symmetrical  throughout  Pertinent Labs: Na 138 Cr 1.53 Trop 351 -> improving UA negative   Basic Metabolic Panel: Recent Labs  Lab 03/08/22 0053 03/08/22 0645 03/08/22 1819 03/08/22 1827 03/09/22 0515 03/10/22 0439  NA 137 139  138 137 138  --  140  K 4.3 4.1  4.4 3.8 3.8  --  3.7  CL 107 112*  111  --  107  --  108  CO2 21* 25  24  --  23  --  24  GLUCOSE 131* 88  86  --  163*  --  98  BUN 31* 29*  27*  --  24*  --  14  CREATININE 1.69* 1.38*  1.42*  --  1.53*  --  1.34*  CALCIUM 8.6* 7.9*  7.7*  --  8.3*  --  8.0*  MG 2.0  --   --  1.7 1.9  --   PHOS  --   --   --  3.2 2.5  --     CBC: Recent Labs  Lab 03/08/22 0053 03/08/22 0645 03/08/22 1819 03/08/22 1827 03/09/22 0515 03/10/22 0439  WBC 7.5 7.2  --  9.3 8.8 6.7  NEUTROABS 4.8 5.3  --   --   --   --   HGB 11.8* 10.1* 11.9* 11.3* 10.8* 10.1*  HCT 38.3* 32.5* 35.0* 36.3* 34.1* 32.2*  MCV 81.7 81.7  --  81.8 81.2 80.1  PLT 199 119*  --  185 173 142*     Coagulation Studies: Recent Labs    03/08/22 0055  LABPROT  14.3  INR 1.1      cEEG 7/19-7/20 and 7/20-71 w/o seizure activity  UA negative, CXR with minimal c/f infection  Assessment: This is a 61 year old gentleman with a past medical history significant for 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 tach arrest in 2022 with anoxic brain injury and stroke at that point, heart failure, CKD stage III admitted after 2 breakthrough GTCs and requiring intubation for hypoxic respiratory failure and severe encephalopathy.  Question of aspiration, but unclear if that preceded seizure, and therefore I cannot identify a clear inciting factor for his breakthrough seizure. Given his cardiac history and left bundle branch block, Vimpat is not a good option. He is well controlled on keppra and depakote and I will continue these.   Recommendations: - Continue Keppra 1500 BID, this is somewhat more  than would normally be use given his renal function, but I would continue it for now - Continue Depakote, may be transitioned to 250 mg every 6 hours for now followed by 500 mg ER formulation every 12 hours when patient is tolerating p.o.  -Appreciate pharmacy assistance with level check - Discontinue long-term EEG monitoring - Appreciate respiratory management and management of comorbidities per CCM - Neurology will be available as needed, please reach out if new questions or concerns arise  Brooke Dare MD-PhD [facility] [phone]  Available 7 AM to 7 PM, outside these hours please contact Neurologist on call listed on AMION

## 2022-03-10 NOTE — TOC Initial Note (Signed)
Transition of Care [facility]) - Initial/Assessment Note    Patient Details  Name: Jeremy Browning MRN: [id number] Date of Birth: 1960/09/15  [facility] CM/SW Contact:    Jeremy Browning, [place] Phone Number: 03/10/2022, 3:15 PM  Clinical Narrative:  Jeremy Browning to Jeremy Browning with [facility] who confirmed pt is a LTC resident and is able to return at dc. SW will follow and assist as indicated.   Jeremy Browning, MSW, LCSW [phone] (coverage)                   Expected Discharge Plan: Skilled Nursing Facility Barriers to Discharge: Continued Medical Work up   Patient Goals and CMS Choice     Choice offered to / list presented to : Sibling  Expected Discharge Plan and Services Expected Discharge Plan: Skilled Nursing Facility       Living arrangements for the past 2 months: Skilled Nursing Facility                                      Prior Living Arrangements/Services Living arrangements for the past 2 months: Skilled Nursing Facility Lives with:: Facility Resident   Do you feel safe going back to the place where you live?: Yes      Need for Family Participation in Patient Care: Yes (Comment) Care giver support system in place?: Yes (comment)   Criminal Activity/Legal Involvement Pertinent to Current Situation/Hospitalization: No - Comment as needed  Activities of Daily Living      Permission Sought/Granted Permission sought to share information with : Facility Medical sales representative                Emotional Assessment       Orientation: : Fluctuating Orientation (Suspected and/or reported Sundowners) Alcohol / Substance Use: Not Applicable Psych Involvement: No (comment)  Admission diagnosis:  Seizure (HCC) [R56.9] Patient Active Problem List   Diagnosis Date Noted   Seizures (HCC) 03/08/2022   Seizure (HCC) 03/08/2022   Status epilepticus (HCC)    Hypotension due to drugs    Subacute osteomyelitis of left tibia (HCC)     Hardware complicating wound infection (HCC)    Acute on chronic HFrEF (heart failure with reduced ejection fraction) (HCC)    Breakthrough seizure (HCC) 11/07/2021   NSTEMI (non-ST elevated myocardial infarction) (HCC) 11/07/2021   Stage 3a chronic kidney disease (HCC) 11/07/2021   Seizure disorder as sequela of cerebrovascular accident (HCC) 11/07/2021   Demand ischemia (HCC)    Status post open reduction and internal fixation (ORIF) of fracture 09/07/2021   Medication monitoring encounter 08/11/2021   Draining cutaneous sinus tract 08/11/2021   Chronic osteomyelitis (HCC) 08/11/2021   Morbid obesity (HCC) 05/14/2021   OSA (obstructive sleep apnea) 05/14/2021   Complete left bundle branch block (LBBB) 05/14/2021   Chronic venous insufficiency -Left Leg > Right. 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   Essential hypertension 02/16/2021   Pressure injury of skin 02/10/2021   Oropharyngeal dysphagia    HFrEF (heart failure with reduced ejection fraction) (HCC)    h/o Cardiopulmonary arrest with successful resuscitation (HCC) 12/25/2020   H/O Acute respiratory failure with hypoxia and hypercapnia (HCC) 12/25/2020   Acute renal failure (HCC) 12/25/2020   Wenckebach second degree AV block 12/25/2020   Lactic acidosis 12/25/2020   Delayed surgical wound healing 06/23/2020   Skin ulcer of knee, left, limited to breakdown of skin (HCC)  06/23/2020   BMI 40.0-44.9, adult (HCC) 12/12/2019   Left ventricular dyssynchrony 09/14/2018   Hyperlipidemia 09/13/2018   Tobacco abuse 02/07/2017   Controlled type 2 diabetes mellitus without complication, without long-term current use of insulin (HCC) 06/15/2016   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 [place] [address] Phone: [phone] Fax: [phone]     Social Determinants of Health (SDOH) Interventions    Readmission Risk Interventions     No data to display

## 2022-03-10 NOTE — Procedures (Signed)
Cortrak  Person Inserting Tube:  Kendell Bane C, RD Tube Type:  Cortrak - 43 inches Tube Size:  10 Tube Location:  Right nare Secured by: Bridle Technique Used to Measure Tube Placement:  Marking at nare/corner of mouth Cortrak Secured At:  70 cm   Cortrak Tube Team Note:  Consult received to place a Cortrak feeding tube.   X-ray is required, abdominal x-ray has been ordered by the Cortrak team. Please confirm tube placement before using the Cortrak tube.   If the tube becomes dislodged please keep the tube and contact the Cortrak team at [email or web address] (password [email or web address]) for replacement.  If after hours and replacement cannot be delayed, place a NG tube and confirm placement with an abdominal x-ray.    Cammy Copa., RD, LDN, CNSC See AMiON for contact information

## 2022-03-10 NOTE — Progress Notes (Signed)
LTM EEG discontinued - no skin breakdown at unhook.   

## 2022-03-10 NOTE — Procedures (Signed)
Extubation Procedure Note  Patient Details:   Name: Jeremy Browning DOB: Dec 01, 1960 MRN: [id number]   Airway Documentation:    Vent end date: 03/10/22 Vent end time: 1038   Evaluation  O2 sats: stable throughout Complications: No apparent complications Patient did tolerate procedure well. Bilateral Breath Sounds: Rhonchi, Diminished   Yes  Pt extubated per MD order. Placed on 3L [place]. Positive cuff leak noted, no stridor heard. RT will continue to monitor.   Memory Argue 03/10/2022, 10:42 AM

## 2022-03-10 NOTE — Progress Notes (Addendum)
NAME:  Jeremy Browning, MRN:  [id number], DOB:  [birth year], LOS: 2 ADMISSION DATE:  03/08/2022, CONSULTATION DATE:  7/19 REFERRING MD:  Dr. Mortimer Fries, CHIEF COMPLAINT:  Seizure   History of Present Illness:  61 year old male with PMH significant for right MCA stroke with L hemiparesis and contractures, seizure on keppra, as well as cardiac arrest complicated by anoxic injury in 2022. He had a prolonged course which included trach and peg, but he has since been able to liberate from both. He resides in SNF where he developed seizures in the early AM hours of 7/19. Upon EMS arrival the seizure was still in process (~20 minutes) with tonic clonic movements. He was given intranasal versed with cessation of sz, but shortly after began seizing again. Upon arrival to ED he was intubated for airway protection and hypoxia with sats of 81% on room air. CT head was non-acute. He was admitted to the ICU service for status epilepticus. Spot EEG negative. Course complicated by bradycardia requiring atropine, hypotension requiring norepinephrine, and elevated troponin which plateaued and has since began to downtrend. The patient was transferred to [facility] for continuous EEG.   Pertinent  Medical History   has a past medical history of CHF (congestive heart failure) ([place]), Diabetes mellitus type II, non insulin dependent ([place]), Essential hypertension Raquel Sarna like), h/o Proteus pneumonia ([place]) (02/17/2021), Heart failure ([place]), Seizures ([place]), Severe hypoxic-ischemic encephalopathy, Sleep apnea, Stroke ([place]), and Venous stasis dermatitis of left lower extremity.   Significant Hospital Events: Including procedures, antibiotic start and stop dates in addition to other pertinent events   7/18 admitted to [facility] for status epilepticus. Tx to [facility] for LTM 7/20 no further sz. Turning off sedation to assess for SBT. Weaned ok while awake but apneic when he fell back asleep off sedation.  7/21 cont WAU/SBT efforts   Interim  History / Subjective:   AKI improving   Flipped to 8/5   Objective   Blood pressure (!) 146/60, pulse (!) 55, temperature 98.2 F (36.8 C), temperature source Bladder, resp. rate 14, weight 109.6 kg, SpO2 100 %. CVP:  [7 mmHg-17 mmHg] 10 mmHg  Vent Mode: PSV;CPAP FiO2 (%):  [30 %] 30 % Set Rate:  [16 bmp] 16 bmp Vt Set:  [520 mL] 520 mL PEEP:  [5 cmH20] 5 cmH20 Pressure Support:  [8 cmH20] 8 cmH20 Plateau Pressure:  [12 cmH20-18 cmH20] 12 cmH20   Intake/Output Summary (Last 24 hours) at 03/10/2022 0848 Last data filed at 03/10/2022 0800 Gross per 24 hour  Intake 1175.25 ml  Output 1190 ml  Net -14.75 ml   Filed Weights   03/09/22 0500 03/10/22 0440  Weight: 110.3 kg 109.6 kg    Examination: General: Chronically and critically ill adult M NAD  HENT: NCAT ETT secure anicteric sclera.  Lungs: CTAb, even unlabored on PSV  Cardiovascular: rr. S1s2 cap refill < 3 sec  Abdomen: soft ndnt + bowel sounds Extremities:BLE edema. No acute joint deformity.  Neuro: Awakens to voice. Following commands. L sided weakness. 53mm pupils  GU: clear yellow urine   Resolved Hospital Problem list    Status Epilepticus  Assessment & Plan:   Hx seizure disorder SE, improved  -hx sz, on keppra. Presented to [facility] from snf with tonic clonic sz  P -appreciate neuro recs -- Keppra 1.5g q12hr, and VPA added 7/20  -cEEG per neuro  -PRN BZD  -delirium precautions -RASS goal 0  Acute hypoxic respiratory failure Aspiration event  P -Cont WUA/Sbt efforts --  hopefully extubate 7/21.  -cont unasyn for aspiration -- will set for 3d course   Hypotension, etiology unclear -- improving  Hx chronic HFrEF NSTEMI LBBB  -trops down trending. CVP doesn't suggest hypovolemia. Svo2 not concerning. ECHO without suggestive etiology. Not septic. ?medication related hypotension  P -weaned off NE  AKI on CKD III, improving AKI  -trend renal indices and UOP   DM2 - SSI  Inadequate PO intake -if  unable to extubate 7/21, start EN  -if we do extubate 7/21, consider cortrak     Best Practice (right click and "Reselect all SmartList Selections" daily)   Diet/type: NPO DVT prophylaxis: prophylactic heparin  GI prophylaxis: PPI Lines: Central line Foley:  N/A  Code Status:  full code Last date of multidisciplinary goals of care discussion [ -- ]  Labs   CBC: Recent Labs  Lab 03/08/22 0053 03/08/22 0645 03/08/22 1819 03/08/22 1827 03/09/22 0515 03/10/22 0439  WBC 7.5 7.2  --  9.3 8.8 6.7  NEUTROABS 4.8 5.3  --   --   --   --   HGB 11.8* 10.1* 11.9* 11.3* 10.8* 10.1*  HCT 38.3* 32.5* 35.0* 36.3* 34.1* 32.2*  MCV 81.7 81.7  --  81.8 81.2 80.1  PLT 199 119*  --  185 173 142*    Basic Metabolic Panel: Recent Labs  Lab 03/08/22 0053 03/08/22 0645 03/08/22 1819 03/08/22 1827 03/09/22 0515 03/10/22 0439  NA 137 139  138 137 138  --  140  K 4.3 4.1  4.4 3.8 3.8  --  3.7  CL 107 112*  111  --  107  --  108  CO2 21* 25  24  --  23  --  24  GLUCOSE 131* 88  86  --  163*  --  98  BUN 31* 29*  27*  --  24*  --  14  CREATININE 1.69* 1.38*  1.42*  --  1.53*  --  1.34*  CALCIUM 8.6* 7.9*  7.7*  --  8.3*  --  8.0*  MG 2.0  --   --  1.7 1.9  --   PHOS  --   --   --  3.2 2.5  --    GFR: Estimated Creatinine Clearance: 67.5 mL/min (A) (by C-G formula based on SCr of 1.34 mg/dL (H)). Recent Labs  Lab 03/08/22 0053 03/08/22 0054 03/08/22 0236 03/08/22 0453 03/08/22 0645 03/08/22 1827 03/09/22 0515 03/10/22 0439  PROCALCITON <0.10  --   --   --   --   --   --   --   WBC 7.5  --   --   --  7.2 9.3 8.8 6.7  LATICACIDVEN  --  5.0* >9.0* 3.5* 2.0*  --   --   --     Liver Function Tests: Recent Labs  Lab 03/08/22 0053 03/08/22 1827  AST 35 31  ALT 32 28  ALKPHOS 86 72  BILITOT 0.5 0.6  PROT 7.5 6.3*  ALBUMIN 3.7 2.8*   No results for input(s): "LIPASE", "AMYLASE" in the last 168 hours. No results for input(s): "AMMONIA" in the last 168 hours.  ABG     Component Value Date/Time   PHART 7.371 03/08/2022 1819   PCO2ART 39.6 03/08/2022 1819   PO2ART 115 (H) 03/08/2022 1819   HCO3 24.2 03/09/2022 1420   TCO2 24 03/08/2022 1819   ACIDBASEDEF 2.0 03/08/2022 1819   O2SAT 77.9 03/09/2022 1420     Coagulation Profile: Recent Labs  Lab 03/08/22 0055  INR 1.1    Cardiac Enzymes: No results for input(s): "CKTOTAL", "CKMB", "CKMBINDEX", "TROPONINI" in the last 168 hours.  HbA1C: Hgb A1c MFr Bld  Date/Time Value Ref Range Status  02/26/2021 06:46 PM 7.1 (H) 4.8 - 5.6 % Final    Comment:    (NOTE) Pre diabetes:          5.7%-6.4%  Diabetes:              >6.4%  Glycemic control for   <7.0% adults with diabetes   12/25/2020 04:46 PM 5.9 (H) 4.8 - 5.6 % Final    Comment:    (NOTE) Pre diabetes:          5.7%-6.4%  Diabetes:              >6.4%  Glycemic control for   <7.0% adults with diabetes     CBG: Recent Labs  Lab 03/09/22 1556 03/09/22 1931 03/09/22 2320 03/10/22 0311 03/10/22 0739  GLUCAP 90 113* 92 72 83   CRITICAL CARE Performed by: Lanier Clam   Total critical care time: 42 minutes  Critical care time was exclusive of separately billable procedures and treating other patients. Critical care was necessary to treat or prevent imminent or life-threatening deterioration.  Critical care was time spent personally by me on the following activities: development of treatment plan with patient and/or surrogate as well as nursing, discussions with consultants, evaluation of patient's response to treatment, examination of patient, obtaining history from patient or surrogate, ordering and performing treatments and interventions, ordering and review of laboratory studies, ordering and review of radiographic studies, pulse oximetry and re-evaluation of patient's condition.  Tessie Fass MSN, AGACNP-BC [facility] Pulmonary/Critical Care Medicine Amion for pager 03/10/2022, 8:48 AM

## 2022-03-10 NOTE — Progress Notes (Signed)
eLink Physician-Brief Progress Note Patient Name: Jeremy Browning DOB: 02-Jan-1961 MRN: [id number]   Date of Service  03/10/2022  HPI/Events of Note  Patient was extubated earlier today and has passed a bedside swallow evaluation.  eICU Interventions  Carbohydrate consistent diet ordered.        Migdalia Dk 03/10/2022, 8:34 PM

## 2022-03-10 NOTE — Procedures (Addendum)
Patient Name: Jeremy Browning  MRN: [id number]  Epilepsy Attending: Charlsie Quest  Referring Physician/Provider: Duayne Cal, NP  Duration: 03/09/2022 2009 to 03/10/2022 1032   Patient history: 61 y.o. male with a history of seizures who is undergoing an EEG to evaluate for seizures.    Level of alertness:  lethargic/sedated   AEDs during EEG study: LEV, VPA   Technical aspects: This EEG study was done with scalp electrodes positioned according to the 10-20 International system of electrode placement. Electrical activity was acquired at a sampling rate of 500Hz  and reviewed with a high frequency filter of 70Hz  and a low frequency filter of 1Hz . EEG data were recorded continuously and digitally stored.    Description: EEG showed continuous 3 to 7 Hz theta- delta slowing admixed with 12 to 14 Hz beta activity in left hemisphere.  There is also 3 to 5 Hz theta-delta slowing in right hemisphere.  Hyperventilation and photic stimulation were not performed.     Of note, parts of study were difficult to interpret due to significant myogenic artifact.   ABNORMALITY -Continuous slow, generalized and lateralized right hemisphere   IMPRESSION: This study is suggestive of cortical dysfunction in right hemisphere likely secondary to underlying structural abnormality/stroke.  Additionally there is moderate diffuse encephalopathy, nonspecific etiology. No seizures or epileptiform discharges were seen throughout the recording.   Gwynneth Fabio 

## 2022-03-11 DIAGNOSIS — G40901 Epilepsy, unspecified, not intractable, with status epilepticus: Secondary | ICD-10-CM | POA: Diagnosis not present

## 2022-03-11 LAB — BASIC METABOLIC PANEL
Anion gap: 6 (ref 5–15)
BUN: 12 mg/dL (ref 8–23)
CO2: 24 mmol/L (ref 22–32)
Calcium: 8 mg/dL — ABNORMAL LOW (ref 8.9–10.3)
Chloride: 109 mmol/L (ref 98–111)
Creatinine, Ser: 1.33 mg/dL — ABNORMAL HIGH (ref 0.61–1.24)
GFR, Estimated: >60 mL/min (ref >60)
Glucose, Bld: 128 mg/dL — ABNORMAL HIGH (ref 70–99)
Potassium: 4.4 mmol/L (ref 3.5–5.1)
Sodium: 139 mmol/L (ref 135–145)

## 2022-03-11 LAB — MAGNESIUM
Magnesium: 2.1 mg/dL (ref 1.7–2.4)
Magnesium: 2.1 mg/dL (ref 1.7–2.4)

## 2022-03-11 LAB — GLUCOSE, CAPILLARY
Glucose-Capillary: 100 mg/dL — ABNORMAL HIGH (ref 70–99)
Glucose-Capillary: 115 mg/dL — ABNORMAL HIGH (ref 70–99)
Glucose-Capillary: 118 mg/dL — ABNORMAL HIGH (ref 70–99)
Glucose-Capillary: 120 mg/dL — ABNORMAL HIGH (ref 70–99)
Glucose-Capillary: 122 mg/dL — ABNORMAL HIGH (ref 70–99)
Glucose-Capillary: 124 mg/dL — ABNORMAL HIGH (ref 70–99)

## 2022-03-11 LAB — PHOSPHORUS
Phosphorus: 3.6 mg/dL (ref 2.5–4.6)
Phosphorus: 3.1 mg/dL (ref 2.5–4.6)

## 2022-03-11 LAB — HEMOGLOBIN A1C
Hgb A1c MFr Bld: 5.5 % (ref 4.8–5.6)
Mean Plasma Glucose: 111.15 mg/dL

## 2022-03-11 NOTE — Progress Notes (Signed)
Occupational Therapy Treatment Patient Details Name: Verlon Pischke MRN: [id number] DOB: 1960/11/06 Today's Date: 03/11/2022   History of present illness Pt is a 61 y/o male admitted from SNF (LTC) secondary to seizures. Pt required intubation, extubated on 7/21. 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   OT comments  Rex was evaluated s/p the above admission list, he resides at a LTC SNF where staff assists with all ADLs, and pt reports he is able to transfer to wc with hemiwalker. Upon evaluation pt was able to get to the EOB with min A and stand 2x with max A and hemi walker. He was limited by baseline L hemiplegia, impaired cognition and general weakness. Pt perseverating on pain from cortrak and required constant re-direction. Upon standing he thought he was going to have a BM, stood again to place bed pan at EOB. He will benefit from OT acutely. Recommend pt d/c back to SNF with continued care and therapy.    Recommendations for follow up therapy are one component of a multi-disciplinary discharge planning process, led by the attending physician.  Recommendations may be updated based on patient status, additional functional criteria and insurance authorization.    Follow Up Recommendations  Skilled nursing-short term rehab (<3 hours/day)    Assistance Recommended at Discharge Frequent or constant Supervision/Assistance  Patient can return home with the following  A lot of help with walking and/or transfers;A lot of help with bathing/dressing/bathroom;Assistance with feeding;Direct supervision/assist for medications management;Assist for transportation;Help with stairs or ramp for entrance   Equipment Recommendations  None recommended by OT       Precautions / Restrictions Precautions Precautions: Fall Precaution Comments: cortrak, L hemi Restrictions Weight Bearing  Restrictions: No       Mobility Bed Mobility Overal bed mobility: Needs Assistance Bed Mobility: Supine to Sit, Sit to Supine     Supine to sit: Min assist Sit to supine: Min assist   General bed mobility comments: min A  for management of L hemibody and to adjust hips towards EOB    Transfers Overall transfer level: Needs assistance Equipment used: Hemi-walker Transfers: Sit to/from Stand Sit to Stand: Max assist           General transfer comment: stood 2x for 3-5 seconds each time     Balance Overall balance assessment: Needs assistance Sitting-balance support: Single extremity supported Sitting balance-Leahy Scale: Fair Sitting balance - Comments: static sitting for ~10 minutes, does prefer to have RUE supported   Standing balance support: Single extremity supported, During functional activity Standing balance-Leahy Scale: Poor Standing balance comment: reliant on external support and therapist assist                           ADL either performed or assessed with clinical judgement   ADL Overall ADL's : Needs assistance/impaired Eating/Feeding: Set up;Sitting   Grooming: Set up;Sitting   Upper Body Bathing: Moderate assistance;Sitting   Lower Body Bathing: Maximal assistance;+2 for physical assistance;+2 for safety/equipment;Sit to/from stand   Upper Body Dressing : Moderate assistance;Sitting   Lower Body Dressing: Maximal assistance;+2 for physical assistance;+2 for safety/equipment;Sit to/from stand   Toilet Transfer: Maximal assistance Toilet Transfer Details (indicate cue type and reason): stood at bed side wtih max A to place bed pan under pt Toileting- Clothing Manipulation and Hygiene: Moderate assistance;+2 for physical assistance;+2 for safety/equipment;Sit to/from  stand       Functional mobility during ADLs: Maximal assistance;+2 for physical assistance;+2 for safety/equipment General ADL Comments: +2 benefical to get away from  EOB. Pt able to stand with max A +1 for 3-5 seconds to assist with ADLs. limited by baseline L hemi, general weakness and impaired cognition    Extremity/Trunk Assessment Upper Extremity Assessment Upper Extremity Assessment: LUE deficits/detail;RUE deficits/detail RUE Deficits / Details: Overall WFL LUE Deficits / Details: Hemi from prior R MCA CVA. hand in contracted fisted postion, able to get full extension PROM with increased time. Elbow and shoulder have very limited movement passively. No attempt at active movement. LUE Sensation: decreased light touch;decreased proprioception LUE Coordination: decreased fine motor;decreased gross motor   Lower Extremity Assessment Lower Extremity Assessment: Defer to PT evaluation   Cervical / Trunk Assessment Cervical / Trunk Assessment: Normal    Vision Baseline Vision/History: 0 No visual deficits Vision Assessment?: Vision impaired- to be further tested in functional context Additional Comments: will benefit from further assessement          Cognition Arousal/Alertness: Awake/alert Behavior During Therapy: Impulsive Overall Cognitive Status: Impaired/Different from baseline Area of Impairment: Memory, Following commands, Safety/judgement, Problem solving, Awareness                     Memory: Decreased short-term memory Following Commands: Follows one step commands with increased time Safety/Judgement: Decreased awareness of safety, Decreased awareness of deficits Awareness: Intellectual Problem Solving: Slow processing, Decreased initiation, Difficulty sequencing, Requires verbal cues General Comments: difficulty recalling PLOF at SNF. impulsive with movement and pulling at cortrak. perseverating on pain due to cortrak, required frequent re-direction.              General Comments VSS on RA, pt drooling out of L mouth    Pertinent Vitals/ Pain       Pain Assessment Pain Assessment: Faces Faces Pain Scale: Hurts  little more Pain Location: nose/cortrak Pain Descriptors / Indicators: Discomfort Pain Intervention(s): Monitored during session  Home Living Family/patient expects to be discharged to:: Skilled nursing facility             Additional Comments: LTC      Prior Functioning/Environment              Frequency  Min 2X/week        Progress Toward Goals  OT Goals(current goals can now be found in the care plan section)     Acute Rehab OT Goals Patient Stated Goal: to walk OT Goal Formulation: With patient Time For Goal Achievement: 03/25/22 Potential to Achieve Goals: Good ADL Goals Pt Will Perform Grooming: with modified independence;sitting Pt Will Perform Upper Body Dressing: with set-up;sitting Pt Will Transfer to Toilet: with min assist;stand pivot transfer;bedside commode Additional ADL Goal #1: Pt will complete bed mobility indep as a precursor to ADLs Additional ADL Goal #2: Pt will demonstrate increased activity tolerance to complete at least 2 OOB ADLs with min A and no sitting rest break  Plan      AM-PAC OT "6 Clicks" Daily Activity     Outcome Measure   Help from another person eating meals?: A Little Help from another person taking care of personal grooming?: A Lot Help from another person toileting, which includes using toliet, bedpan, or urinal?: A Lot Help from another person bathing (including washing, rinsing, drying)?: A Lot Help from another person to put on and taking off regular upper body clothing?: A Lot Help from another  person to put on and taking off regular lower body clothing?: A Lot 6 Click Score: 13    End of Session Equipment Utilized During Treatment: Gait belt (hemiwalker)  OT Visit Diagnosis: Unsteadiness on feet (R26.81);Other abnormalities of gait and mobility (R26.89);Muscle weakness (generalized) (M62.81);Hemiplegia and hemiparesis;Pain Hemiplegia - Right/Left: Left Hemiplegia - dominant/non-dominant:  Non-Dominant Hemiplegia - caused by: Cerebral infarction   Activity Tolerance Patient tolerated treatment well   Patient Left in bed;with call bell/phone within reach;with bed alarm set   Nurse Communication Mobility status        Time: [id number] OT Time Calculation (min): 25 min  Charges: OT General Charges $OT Visit: 1 Visit OT Evaluation $OT Eval Moderate Complexity: 1 Mod OT Treatments $Therapeutic Activity: 8-22 mins   Mansi Tokar A Colandra Ohanian 03/11/2022, 3:22 PM

## 2022-03-11 NOTE — NC FL2 (Signed)
[place] MEDICAID FL2 LEVEL OF CARE SCREENING TOOL     IDENTIFICATION  Patient Name: Jeremy Browning Birthdate: [birth year] Sex: male Admission Date (Current Location): 03/08/2022  [facility] and [place] Number:  Producer, television/film/video and Address:  [facility], [address] [place], [place] [address]      Provider Number: [id number]  Attending Physician Name and Address:  Ollen Bowl, MD  Relative Name and Phone Number:       Current Level of Care: Hospital Recommended Level of Care: Skilled Nursing Facility Prior Approval Number:    Date Approved/Denied:   PASRR Number: [id number]  Discharge Plan: SNF    Current Diagnoses: Patient Active Problem List   Diagnosis Date Noted   Seizures (HCC) 03/08/2022   Seizure (HCC) 03/08/2022   Status epilepticus (HCC)    Hypotension due to drugs    Subacute osteomyelitis of left tibia [facility])    Hardware complicating wound infection (HCC)    Acute on chronic HFrEF (heart failure with reduced ejection fraction) (HCC)    Breakthrough seizure (HCC) 11/07/2021   NSTEMI (non-ST elevated myocardial infarction) (HCC) 11/07/2021   Stage 3a chronic kidney disease (HCC) 11/07/2021   Seizure disorder as sequela of cerebrovascular accident (HCC) 11/07/2021   Demand ischemia (HCC)    Status post open reduction and internal fixation (ORIF) of fracture 09/07/2021   Medication monitoring encounter 08/11/2021   Draining cutaneous sinus tract 08/11/2021   Chronic osteomyelitis (HCC) 08/11/2021   Morbid obesity (HCC) 05/14/2021   OSA (obstructive sleep apnea) 05/14/2021   Complete left bundle branch block (LBBB) 05/14/2021   Chronic venous insufficiency -Left Leg > Right. 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   Essential hypertension 02/16/2021   Pressure injury of skin 02/10/2021   Oropharyngeal dysphagia    HFrEF (heart failure with reduced ejection fraction) (HCC)    h/o Cardiopulmonary arrest with successful  resuscitation (HCC) 12/25/2020   H/O Acute respiratory failure with hypoxia and hypercapnia (HCC) 12/25/2020   Acute renal failure (HCC) 12/25/2020   Wenckebach second degree AV block 12/25/2020   Lactic acidosis 12/25/2020   Delayed surgical wound healing 06/23/2020   Skin ulcer of knee, left, limited to breakdown of skin (HCC) 06/23/2020   BMI 40.0-44.9, adult (HCC) 12/12/2019   Left ventricular dyssynchrony 09/14/2018   Hyperlipidemia 09/13/2018   Tobacco abuse 02/07/2017   Controlled type 2 diabetes mellitus without complication, without long-term current use of insulin (HCC) 06/15/2016   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) 2017   Non-ischemic cardiomyopathy (HCC) 2017   Edema of both legs 10/15/2013   Cerebral infarction (HCC) 09/26/2013   COPD (chronic obstructive pulmonary disease) (HCC) 09/26/2013   Closed fracture of lateral portion of left tibial plateau 09/24/2013    Orientation RESPIRATION BLADDER Height & Weight     Self, Time, Situation, Place  O2 Continent Weight: 240 lb 11.9 oz (109.2 kg) Height:     BEHAVIORAL SYMPTOMS/MOOD NEUROLOGICAL BOWEL NUTRITION STATUS      Continent    AMBULATORY STATUS COMMUNICATION OF NEEDS Skin   Extensive Assist Verbally                         Personal Care Assistance Level of Assistance  Bathing, Feeding, Dressing Bathing Assistance: Limited assistance Feeding assistance: Limited assistance Dressing Assistance: Maximum assistance     Functional Limitations Info  Sight, Hearing, Speech Sight Info:  Adequate Hearing Info: Adequate Speech Info: Impaired    SPECIAL CARE FACTORS FREQUENCY  PT (By licensed PT), OT (By licensed OT)                    Contractures Contractures Info: Not present    Additional Factors Info  Code Status Code Status Info: FULL CODE             Current Medications (03/11/2022):  This is the  current hospital active medication list Current Facility-Administered Medications  Medication Dose Route Frequency Provider Last Rate Last Admin   Chlorhexidine Gluconate Cloth 2 % PADS 6 each  6 each Topical Q0600 Lorin Glass, MD   6 each at 03/11/22 0543   feeding supplement (OSMOLITE 1.5 CAL) liquid 1,000 mL  1,000 mL Per Tube Continuous Hunsucker, Lesia Sago, MD 60 mL/hr at 03/10/22 2308 1,000 mL at 03/10/22 2308   feeding supplement (PROSource TF) liquid 45 mL  45 mL Per Tube TID Hunsucker, Lesia Sago, MD   45 mL at 03/11/22 0959   heparin injection 5,000 Units  5,000 Units Subcutaneous Q8H Janeann Forehand D, NP   5,000 Units at 03/11/22 0543   insulin aspart (novoLOG) injection 0-15 Units  0-15 Units Subcutaneous Q4H Duayne Cal, NP   2 Units at 03/11/22 1258   levETIRAcetam (KEPPRA) 100 MG/ML solution 1,500 mg  1,500 mg Per Tube BID Bhagat, Srishti L, MD   1,500 mg at 03/11/22 0959   ondansetron (ZOFRAN) injection 4 mg  4 mg Intravenous Q6H Bowser, Kaylyn Layer, NP   4 mg at 03/11/22 1142   Oral care mouth rinse  15 mL Mouth Rinse Q2H Lorin Glass, MD   15 mL at 03/11/22 1142   Oral care mouth rinse  15 mL Mouth Rinse PRN Lorin Glass, MD       senna-docusate (Senokot-S) tablet 1 tablet  1 tablet Per Tube BID Hunsucker, Lesia Sago, MD   1 tablet at 03/11/22 7846   valproic acid (DEPAKENE) 250 MG/5ML solution 250 mg  250 mg Per Tube QID Bhagat, Srishti L, MD   250 mg at 03/11/22 9629     Discharge Medications: Please see discharge summary for a list of discharge medications.  Relevant Imaging Results:  Relevant Lab Results:   Additional Information SS# [id number]  Deatra Robinson, [place]

## 2022-03-11 NOTE — Evaluation (Signed)
Physical Therapy Evaluation Patient Details Name: Jeremy Browning MRN: [id number] DOB: May 29, 1961 Today's Date: 03/11/2022  History of Present Illness  Pt is a 61 y/o male admitted from SNF (LTC) secondary to seizures. Pt required intubation, extubated on 7/21. 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   Clinical Impression  Pt presented supine in bed with HOB elevated, awake and willing to participate in therapy session. Prior to admission, pt reported that he was able to ambulate with use of a quad cane and required assistance from SNF staff for ADLs. Pt resides at a SNF (LTC). At the time of evaluation, pt with limited participation. He was able to complete bed mobility with min guard but refused further mobility at this time. PT will plan to take a quad cane for next session to trial with pt if willing. Pt would continue to benefit from skilled physical therapy services at this time while admitted and after d/c to address the below listed limitations in order to improve overall safety and independence with functional mobility.      Recommendations for follow up therapy are one component of a multi-disciplinary discharge planning process, led by the attending physician.  Recommendations may be updated based on patient status, additional functional criteria and insurance authorization.  Follow Up Recommendations Skilled nursing-short term rehab (<3 hours/day) Can patient physically be transported by private vehicle: No    Assistance Recommended at Discharge Frequent or constant Supervision/Assistance  Patient can return home with the following  Two people to help with walking and/or transfers;Two people to help with bathing/dressing/bathroom;Assistance with cooking/housework;Assist for transportation    Equipment Recommendations None recommended by PT  Recommendations for Other  Services       Functional Status Assessment Patient has had a recent decline in their functional status and demonstrates the ability to make significant improvements in function in a reasonable and predictable amount of time.     Precautions / Restrictions Precautions Precautions: Fall Precaution Comments: NGT Restrictions Weight Bearing Restrictions: No      Mobility  Bed Mobility Overal bed mobility: Needs Assistance Bed Mobility: Supine to Sit, Sit to Supine     Supine to sit: Min guard Sit to supine: Min guard   General bed mobility comments: pt able to initiate movement of sitting upright at EOB with use of bed rail and HOB elevated and began to move his bilateral LEs towards the EOB; however, would not allow PT to assist his L LE and then refused to attempt to move any further. He was able to pull himself forwards with use of R UE on bed rail to achieve a long sitting position in bed for a very brief amount of time (~5 seconds)    Transfers                   General transfer comment: pt refusing    Ambulation/Gait                  Stairs            Wheelchair Mobility    Modified Rankin (Stroke Patients Only)       Balance Overall balance assessment: Needs assistance Sitting-balance support: Single extremity supported Sitting balance-Leahy Scale: Poor  Pertinent Vitals/Pain Pain Assessment Pain Assessment: No/denies pain    Home Living Family/patient expects to be discharged to:: Skilled nursing facility                   Additional Comments: LTC    Prior Function Prior Level of Function : Needs assist       Physical Assist : Mobility (physical);ADLs (physical)   ADLs (physical): Bathing Mobility Comments: ambulates with use of a quad cane ADLs Comments: requires assistance with bathing from staff at SNF, sits on a shower chair     Hand Dominance   Dominant  Hand: Right    Extremity/Trunk Assessment   Upper Extremity Assessment Upper Extremity Assessment: Defer to OT evaluation    Lower Extremity Assessment Lower Extremity Assessment: Difficult to assess due to impaired cognition;Generalized weakness (difficult to assess due to minimal participation)       Communication   Communication: No difficulties  Cognition Arousal/Alertness: Awake/alert Behavior During Therapy: Flat affect Overall Cognitive Status: Impaired/Different from baseline Area of Impairment: Safety/judgement, Problem solving                         Safety/Judgement: Decreased awareness of deficits   Problem Solving: Difficulty sequencing, Requires verbal cues          General Comments      Exercises     Assessment/Plan    PT Assessment Patient needs continued PT services  PT Problem List Decreased strength;Decreased range of motion;Decreased balance;Decreased mobility;Decreased activity tolerance;Decreased coordination;Decreased cognition;Decreased knowledge of use of DME;Decreased safety awareness;Decreased knowledge of precautions       PT Treatment Interventions DME instruction;Gait training;Stair training;Functional mobility training;Therapeutic activities;Neuromuscular re-education;Balance training;Therapeutic exercise;Cognitive remediation;Patient/family education    PT Goals (Current goals can be found in the Care Plan section)  Acute Rehab PT Goals Patient Stated Goal: to return to his SNF PT Goal Formulation: With patient Time For Goal Achievement: 03/25/22 Potential to Achieve Goals: Good    Frequency Min 2X/week     Co-evaluation               AM-PAC PT "6 Clicks" Mobility  Outcome Measure Help needed turning from your back to your side while in a flat bed without using bedrails?: None Help needed moving from lying on your back to sitting on the side of a flat bed without using bedrails?: A Little Help needed moving to  and from a bed to a chair (including a wheelchair)?: A Little Help needed standing up from a chair using your arms (e.g., wheelchair or bedside chair)?: A Lot Help needed to walk in hospital room?: Total Help needed climbing 3-5 steps with a railing? : Total 6 Click Score: 14    End of Session   Activity Tolerance: Patient tolerated treatment well Patient left: in bed;with call bell/phone within reach;with bed alarm set Nurse Communication: Mobility status PT Visit Diagnosis: Other abnormalities of gait and mobility (R26.89)    Time: 4268-3419 PT Time Calculation (min) (ACUTE ONLY): 15 min   Charges:   PT Evaluation $PT Eval Moderate Complexity: 1 Mod          Ginette Pitman, PT, DPT  Acute Rehabilitation Services Office [phone]   Alessandra Bevels Patience Nuzzo 03/11/2022, 10:02 AM

## 2022-03-11 NOTE — Progress Notes (Signed)
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 He resides in SNF where he developed seizures in the early AM hours of 7/19. Upon EMS arrival the seizure was still in process (~20 minutes) with tonic clonic movements. He was given intranasal versed with cessation of sz, but shortly after began seizing again. Upon arrival to ED he was intubated for airway protection and hypoxia with sats of 81% on room air. CT head was non-acute. He was admitted to the ICU service for status epilepticus. Spot EEG negative. Course complicated by bradycardia requiring atropine, hypotension requiring norepinephrine, and elevated troponin which plateaued and has since began to downtrend. The patient was transferred to [facility] for continuous EEG.  Neurology consulted and AED broadened to Keppra and VPA.  He extubated on 7/21 and transferred to the floor  Assessment & Plan:   Seizure disorder with breakthrough seizures: -Continue Keppra 1500 twice daily, continue valproate -Appreciate neurology recommendation. -Seizure precautions/fall precautions -PT/OT recommended SNF TOC aware  Acute hypoxemic respiratory failure: -Inability to protect airway in the setting of breakthrough seizures -Required intubation on admission.  Extubated on 7/21.  Currently on room air and doing well -Finished Unasyn course for possible aspiration pneumonia x3 days  Hypotension: Blood pressure is improving -Continue to monitor  Chronic diastolic CHF NSTEMI Left bundle branch block: -Troponin downtrending.  Likely demand ischemia. -Patient denies any chest pain.  Echo shows ejection fraction of 50 to 55%, mild concentric LVH, mild AR -Strict INO's and daily weight.  AKI on CKD  stage IIIb: -Renal function improving.  Avoid nephrotoxic medication.  Repeat BMP tomorrow a.m.  Type 2 diabetes: Last A1c checked about a year ago was 7.1.  Repeat A1c today.  Continue sliding scale insulin -Continue to monitor blood sugar closely  Inadequate p.o. intake: -Has cortrak  Morbid obesity with BMI of 37: Diet modification/exercise recommended  DVT prophylaxis: Heparin Code Status: Full code Family Communication:  None present at bedside.  Plan of care discussed with patient in length and he verbalized understanding and agreed with it. Disposition Plan: SNF  Consultants:  Neurology PCCM  Procedures:  EEG Intubation  Antimicrobials:  Unasyn  Status is: Inpatient   Subjective: Patient seen and examined.  Sitting comfortably on the bed.  Reports that he feels better.  Able to answer questions appropriately.  No acute events overnight.  Objective: Vitals:   03/10/22 2240 03/11/22 0036 03/11/22 0414 03/11/22 0739  BP: (!) 156/59 (!) 149/62 (!) 118/48 (!) (P) 147/70  Pulse: 65 66 68 60  Resp: 20 18 16 15   Temp: 98.5 F (36.9 C) 98.6 F (37 C) 97.8 F (36.6 C) 98.4 F (36.9 C)  TempSrc: Oral Oral Oral Oral  SpO2: 100% 97% 100% 98%  Weight:   109.2 kg     Intake/Output Summary (Last 24 hours) at 03/11/2022 1426 Last data filed at 03/10/2022 2000 Gross per 24 hour  Intake 417.97 ml  Output 100 ml  Net 317.97 ml   Filed Weights   03/09/22 0500 03/10/22 0440 03/11/22 0414  Weight: 110.3 kg 109.6 kg 109.2 kg    Examination:  General exam: Appears calm and comfortable, obese, has cortrak, on room air, communicating well Respiratory system: Clear to auscultation. Respiratory effort normal.  Cardiovascular system: S1 & S2 heard, RRR. No JVD, murmurs, rubs, gallops or clicks. No pedal edema. Gastrointestinal system: Abdomen is obese, soft and nontender. No organomegaly or masses felt. Normal bowel sounds heard. Central nervous system: Alert and oriented.   Left-sided weakness Psychiatry: Judgement and insight appear normal. Mood & affect appropriate.    Data Reviewed: I have personally reviewed following labs and imaging studies  CBC: Recent Labs  Lab 03/08/22 0053 03/08/22 0645 03/08/22 1819 03/08/22 1827 03/09/22 0515 03/10/22 0439  WBC 7.5 7.2  --  9.3 8.8 6.7  NEUTROABS 4.8 5.3  --   --   --   --   HGB 11.8* 10.1* 11.9* 11.3* 10.8* 10.1*  HCT 38.3* 32.5* 35.0* 36.3* 34.1* 32.2*  MCV 81.7 81.7  --  81.8 81.2 80.1  PLT 199 119*  --  185 173 142*   Basic Metabolic Panel: Recent Labs  Lab 03/08/22 0053 03/08/22 0645 03/08/22 1819 03/08/22 1827 03/09/22 0515 03/10/22 0439 03/10/22 1711 03/11/22 0623  NA 137 139  138 137 138  --  140  --  139  K 4.3 4.1  4.4 3.8 3.8  --  3.7  --  4.4  CL 107 112*  111  --  107  --  108  --  109  CO2 21* 25  24  --  23  --  24  --  24  GLUCOSE 131* 88  86  --  163*  --  98  --  128*  BUN 31* 29*  27*  --  24*  --  14  --  12  CREATININE 1.69* 1.38*  1.42*  --  1.53*  --  1.34*  --  1.33*  CALCIUM 8.6* 7.9*  7.7*  --  8.3*  --  8.0*  --  8.0*  MG 2.0  --   --  1.7 1.9  --  2.1 2.1  PHOS  --   --   --  3.2 2.5  --  3.9 3.6   GFR: Estimated Creatinine Clearance: 67.9 mL/min (A) (by C-G formula based on SCr of 1.33 mg/dL (H)). Liver Function Tests: Recent Labs  Lab 03/08/22 0053 03/08/22 1827  AST 35 31  ALT 32 28  ALKPHOS 86 72  BILITOT 0.5 0.6  PROT 7.5 6.3*  ALBUMIN 3.7 2.8*   No results for input(s): "LIPASE", "AMYLASE" in the last 168 hours. No results for input(s): "AMMONIA" in the last 168 hours. Coagulation Profile: Recent Labs  Lab 03/08/22 0055  INR 1.1   Cardiac Enzymes: No results for input(s): "CKTOTAL", "CKMB", "CKMBINDEX", "TROPONINI" in the last 168 hours. BNP (last 3 results) No results for input(s): "PROBNP" in the last 8760 hours. HbA1C: No results for input(s): "HGBA1C" in the last 72 hours. CBG: Recent Labs  Lab 03/10/22 1942  03/11/22 0022 03/11/22 0411 03/11/22 1006 03/11/22 1203  GLUCAP 78 100* 122* 120* 124*   Lipid Profile: No results for input(s): "CHOL", "HDL", "LDLCALC", "TRIG", "CHOLHDL", "LDLDIRECT" in the last 72 hours. Thyroid Function Tests: No results for input(s): "TSH", "T4TOTAL", "FREET4", "T3FREE", "THYROIDAB" in the last 72 hours. Anemia Panel: No results for input(s): "VITAMINB12", "FOLATE", "FERRITIN", "TIBC", "IRON", "RETICCTPCT" in the last 72 hours. Sepsis Labs: Recent Labs  Lab 03/08/22 0053 03/08/22 0054 03/08/22 0236 03/08/22 0453 03/08/22 0645  PROCALCITON <0.10  --   --   --   --   LATICACIDVEN  --  5.0* >9.0* 3.5* 2.0*    Recent Results (from the  past 240 hour(s))  Resp Panel by RT-PCR (Flu A&B, Covid) Anterior Nasal Swab     Status: None   Collection Time: 03/08/22  1:17 AM   Specimen: Anterior Nasal Swab  Result Value Ref Range Status   SARS Coronavirus 2 by RT PCR NEGATIVE NEGATIVE Final    Comment: (NOTE) SARS-CoV-2 target nucleic acids are NOT DETECTED.  The SARS-CoV-2 RNA is generally detectable in upper respiratory specimens during the acute phase of infection. The lowest concentration of SARS-CoV-2 viral copies this assay can detect is 138 copies/mL. A negative result does not preclude SARS-Cov-2 infection and should not be used as the sole basis for treatment or other patient management decisions. A negative result may occur with  improper specimen collection/handling, submission of specimen other than nasopharyngeal swab, presence of viral mutation(s) within the areas targeted by this assay, and inadequate number of viral copies(<138 copies/mL). A negative result must be combined with clinical observations, patient history, and epidemiological information. The expected result is Negative.  Fact Sheet for Patients:  EntrepreneurPulse.com.au  Fact Sheet for Healthcare Providers:  IncredibleEmployment.be  This test  is no t yet approved or cleared by the [place] FDA and  has been authorized for detection and/or diagnosis of SARS-CoV-2 by FDA under an Emergency Use Authorization (EUA). This EUA will remain  in effect (meaning this test can be used) for the duration of the COVID-19 declaration under Section 564(b)(1) of the Act, 21 U.S.C.section 360bbb-3(b)(1), unless the authorization is terminated  or revoked sooner.       Influenza A by PCR NEGATIVE NEGATIVE Final   Influenza B by PCR NEGATIVE NEGATIVE Final    Comment: (NOTE) The Xpert Xpress SARS-CoV-2/FLU/RSV plus assay is intended as an aid in the diagnosis of influenza from Nasopharyngeal swab specimens and should not be used as a sole basis for treatment. Nasal washings and aspirates are unacceptable for Xpert Xpress SARS-CoV-2/FLU/RSV testing.  Fact Sheet for Patients: EntrepreneurPulse.com.au  Fact Sheet for Healthcare Providers: IncredibleEmployment.be  This test is not yet approved or cleared by the [place] FDA and has been authorized for detection and/or diagnosis of SARS-CoV-2 by FDA under an Emergency Use Authorization (EUA). This EUA will remain in effect (meaning this test can be used) for the duration of the COVID-19 declaration under Section 564(b)(1) of the Act, 21 U.S.C. section 360bbb-3(b)(1), unless the authorization is terminated or revoked.  Performed at [facility], [address] [place], [place] [address]   Blood Culture (routine x 2)     Status: None (Preliminary result)   Collection Time: 03/08/22  1:17 AM   Specimen: BLOOD  Result Value Ref Range Status   Specimen Description BLOOD RIGHT WRIST  Final   Special Requests   Final    BOTTLES DRAWN AEROBIC AND ANAEROBIC Blood Culture adequate volume   Culture   Final    NO GROWTH 3 DAYS Performed at [facility], [address], [place], [place] [address]    Report Status PENDING   Incomplete  Blood Culture (routine x 2)     Status: None (Preliminary result)   Collection Time: 03/08/22  1:17 AM   Specimen: BLOOD  Result Value Ref Range Status   Specimen Description BLOOD RIGHT ASSIST CONTROL  Final   Special Requests   Final    BOTTLES DRAWN AEROBIC AND ANAEROBIC Blood Culture adequate volume   Culture   Final    NO GROWTH 3 DAYS Performed at [facility], [address] [place], [place] [address]    Report Status PENDING  Incomplete  Urine Culture     Status: Abnormal   Collection Time: 03/08/22  1:17 AM   Specimen: Urine, Random  Result Value Ref Range Status   Specimen Description   Final    URINE, RANDOM Performed at [facility], [address], [place], [place] [address]    Special Requests   Final    NONE Performed at [facility], [address] [place], [place] [address]    Culture (A)  Final    <10,000 COLONIES/mL INSIGNIFICANT GROWTH Performed at [facility] [address] [place], [place] [address]    Report Status 03/09/2022 FINAL  Final  MRSA Next Gen by PCR, Nasal     Status: None   Collection Time: 03/08/22  5:21 PM   Specimen: Nasal Mucosa; Nasal Swab  Result Value Ref Range Status   MRSA by PCR Next Gen NOT DETECTED NOT DETECTED Final    Comment: (NOTE) The GeneXpert MRSA Assay (FDA approved for NASAL specimens only), is one component of a comprehensive MRSA colonization surveillance program. It is not intended to diagnose MRSA infection nor to guide or monitor treatment for MRSA infections. Test performance is not FDA approved in patients less than 59 years old. Performed at [facility], [place] [address], [place], [place] [address]       Radiology Studies: DG Abd Portable 1V  Result Date: 03/10/2022 CLINICAL DATA:  Feeding tube placement. EXAM: PORTABLE ABDOMEN - 1 VIEW COMPARISON:  March 09, 2022. FINDINGS: The bowel gas pattern is normal. Distal tip of feeding tube is seen in  expected position of the stomach. No radio-opaque calculi or other significant radiographic abnormality are seen. IMPRESSION: Distal tip of feeding tube seen in expected position of the stomach. Electronically Signed   By: Marijo Conception M.D.   On: 03/10/2022 10:06   DG Abd Portable 1V  Result Date: 03/09/2022 CLINICAL DATA:  NG tube EXAM: PORTABLE ABDOMEN - 1 VIEW COMPARISON:  03/08/2022 FINDINGS: Esophageal tube is looped within the stomach, the tip is in the mid proximal stomach. Gas distended bowel in the upper abdomen. IMPRESSION: Esophageal tube is looped in the stomach with the tip positioned over the body of the stomach Electronically Signed   By: Donavan Foil M.D.   On: 03/09/2022 20:39    Scheduled Meds:  Chlorhexidine Gluconate Cloth  6 each Topical Q0600   feeding supplement (PROSource TF)  45 mL Per Tube TID   heparin  5,000 Units Subcutaneous Q8H   insulin aspart  0-15 Units Subcutaneous Q4H   levETIRAcetam  1,500 mg Per Tube BID   ondansetron (ZOFRAN) IV  4 mg Intravenous Q6H   mouth rinse  15 mL Mouth Rinse Q2H   senna-docusate  1 tablet Per Tube BID   valproic acid  250 mg Per Tube QID   Continuous Infusions:  feeding supplement (OSMOLITE 1.5 CAL) 1,000 mL (03/10/22 2308)     LOS: 3 days   Time spent: 35 minutes   Vicie Cech Loann Quill, MD [facility]  If 7PM-7AM, please contact night-coverage [email or web address] 03/11/2022, 2:26 PM

## 2022-03-12 DIAGNOSIS — G40901 Epilepsy, unspecified, not intractable, with status epilepticus: Secondary | ICD-10-CM | POA: Diagnosis not present

## 2022-03-12 LAB — CBC
HCT: 31 % — ABNORMAL LOW (ref 39.0–52.0)
Hemoglobin: 9.9 g/dL — ABNORMAL LOW (ref 13.0–17.0)
MCH: 25.9 pg — ABNORMAL LOW (ref 26.0–34.0)
MCHC: 31.9 g/dL (ref 30.0–36.0)
MCV: 81.2 fL (ref 80.0–100.0)
Platelets: 164 K/uL (ref 150–400)
RBC: 3.82 MIL/uL — ABNORMAL LOW (ref 4.22–5.81)
RDW: 14.2 % (ref 11.5–15.5)
WBC: 5.3 K/uL (ref 4.0–10.5)
nRBC: 0 % (ref 0.0–0.2)

## 2022-03-12 LAB — GLUCOSE, CAPILLARY
Glucose-Capillary: 95 mg/dL (ref 70–99)
Glucose-Capillary: 83 mg/dL (ref 70–99)
Glucose-Capillary: 98 mg/dL (ref 70–99)
Glucose-Capillary: 137 mg/dL — ABNORMAL HIGH (ref 70–99)

## 2022-03-12 LAB — PHOSPHORUS: Phosphorus: 3.7 mg/dL (ref 2.5–4.6)

## 2022-03-12 LAB — BASIC METABOLIC PANEL WITH GFR
Anion gap: 6 (ref 5–15)
BUN: 17 mg/dL (ref 8–23)
CO2: 25 mmol/L (ref 22–32)
Calcium: 8.2 mg/dL — ABNORMAL LOW (ref 8.9–10.3)
Chloride: 111 mmol/L (ref 98–111)
Creatinine, Ser: 1.38 mg/dL — ABNORMAL HIGH (ref 0.61–1.24)
GFR, Estimated: 58 mL/min — ABNORMAL LOW
Glucose, Bld: 88 mg/dL (ref 70–99)
Potassium: 4.7 mmol/L (ref 3.5–5.1)
Sodium: 142 mmol/L (ref 135–145)

## 2022-03-12 LAB — MAGNESIUM: Magnesium: 2.1 mg/dL (ref 1.7–2.4)

## 2022-03-12 MED ORDER — ATORVASTATIN CALCIUM 80 MG PO TABS
80.0000 mg | ORAL_TABLET | Freq: Every day | ORAL | Status: DC
  Administered 2022-03-12 – 2022-03-13 (×2): 80 mg via ORAL
  Filled 2022-03-12 (×2): qty 1

## 2022-03-12 MED ORDER — ASPIRIN 81 MG PO TBEC
81.0000 mg | DELAYED_RELEASE_TABLET | Freq: Every day | ORAL | Status: DC
  Administered 2022-03-12 – 2022-03-13 (×2): 81 mg via ORAL
  Filled 2022-03-12 (×2): qty 1

## 2022-03-12 MED ORDER — ONDANSETRON HCL 4 MG PO TABS: 4.0000 mg | ORAL_TABLET | Freq: Four times a day (QID) | ORAL | Status: DC | PRN

## 2022-03-12 MED ORDER — CARVEDILOL 6.25 MG PO TABS
6.2500 mg | ORAL_TABLET | Freq: Two times a day (BID) | ORAL | Status: DC
  Administered 2022-03-12 – 2022-03-13 (×3): 6.25 mg via ORAL
  Filled 2022-03-12 (×3): qty 1

## 2022-03-12 MED ORDER — HYDRALAZINE HCL 20 MG/ML IJ SOLN
10.0000 mg | Freq: Four times a day (QID) | INTRAMUSCULAR | Status: DC | PRN
Start: 2022-03-12 — End: 2022-03-13

## 2022-03-12 MED ORDER — LOSARTAN POTASSIUM 25 MG PO TABS
25.0000 mg | ORAL_TABLET | Freq: Every day | ORAL | Status: DC
Start: 2022-03-12 — End: 2022-03-13
  Administered 2022-03-12 – 2022-03-13 (×2): 25 mg via ORAL
  Filled 2022-03-12 (×2): qty 1

## 2022-03-12 NOTE — Progress Notes (Signed)
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 He resides in SNF where he developed seizures in the early AM hours of 7/19. Upon EMS arrival the seizure was still in process (~20 minutes) with tonic clonic movements. He was given intranasal versed with cessation of sz, but shortly after began seizing again. Upon arrival to ED he was intubated for airway protection and hypoxia with sats of 81% on room air. CT head was non-acute. He was admitted to the ICU service for status epilepticus. Spot EEG negative. Course complicated by bradycardia requiring atropine, hypotension requiring norepinephrine, and elevated troponin which plateaued and has since began to downtrend. The patient was transferred to [facility] for continuous EEG.  Neurology consulted and AED broadened to Keppra and VPA.  He extubated on 7/21 and transferred to the floor  Assessment & Plan:   Seizure disorder with breakthrough seizures: -Continue Keppra 1500 twice daily, continue valproate -Appreciate neurology recommendation. -Seizure precautions/fall precautions  Acute hypoxemic respiratory failure: -Inability to protect airway in the setting of breakthrough seizures -Required intubation on admission.  Extubated on 7/21.   -Currently on room air and doing well -Finished Unasyn course for possible aspiration pneumonia x3 days  Hypertension: Blood pressure is elevated today -Resume home meds Coreg and losartan -Monitor blood pressure closely  Chronic diastolic CHF NSTEMI Left bundle branch block: -Troponin downtrending.  Likely demand ischemia. -Patient denies any chest pain.  Echo shows ejection fraction of 50 to 55%, mild concentric LVH, mild AR -Strict INO's and daily  weight.  AKI on CKD stage IIIb: -Renal function improving.  Avoid nephrotoxic medication.    Type 2 diabetes: Last A1c checked about a year ago was 7.1.  Repeat A1c today.  Continue sliding scale insulin -Continue to monitor blood sugar closely  Inadequate p.o. intake: -Has cortrak -Evaluated by SLP recommended dysphagia 2 diet.  Normocytic anemia: H&H between 10-11 -Slightly trended down to 9.9 this morning.  Continue to monitor.  Thrombocytopenia: Resolved  Morbid obesity with BMI of 37: Diet modification/exercise recommended  Disposition: PT/OT recommended SNF-TOC aware  DVT prophylaxis: Heparin Code Status: Full code Family Communication:  None present at bedside.  Plan of care discussed with patient in length and he verbalized understanding and agreed with it. Disposition Plan: SNF  Consultants:  Neurology PCCM  Procedures:  EEG Intubation  Antimicrobials:  Unasyn  Status is: Inpatient   Subjective: Patient seen and examined.  Lying comfortably on the bed.  He is upset that he was stuck twice this morning for the blood work.  He is concerned that he is losing weight and he wanted to eat something by mouth.  No fever.  No seizure.  No acute events overnight.  Objective: Vitals:   03/12/22 0000 03/12/22 0400 03/12/22 0500 03/12/22 0810  BP: (!) 155/59 (!) 148/49  (!) 167/59  Pulse: 69 66  65  Resp: 20 20  (!) 28  Temp: 98.8 F (37.1 C) 98.6 F (37 C)  98.7 F (37.1 C)  TempSrc: Oral Oral  Oral  SpO2: 99% 98%  98%  Weight:   111 kg     Intake/Output Summary (Last 24 hours) at 03/12/2022 0956 Last data filed at 03/12/2022 0600 Gross per 24 hour  Intake 720  ml  Output 2200 ml  Net -1480 ml    Filed Weights   03/10/22 0440 03/11/22 0414 03/12/22 0500  Weight: 109.6 kg 109.2 kg 111 kg    Examination:  General exam: Appears calm and comfortable, obese, has cortrak, on room air, communicating well Respiratory system: Clear to auscultation.  Respiratory effort normal. Cardiovascular system: S1 & S2 heard, RRR. No JVD, murmurs, rubs, gallops or clicks. No pedal edema. Gastrointestinal system: Abdomen is obese, soft and nontender. No organomegaly or masses felt. Normal bowel sounds heard. Central nervous system: Alert and oriented.  Left-sided paraplegic  psychiatry: Judgement and insight appear normal. Mood & affect appropriate.    Data Reviewed: I have personally reviewed following labs and imaging studies  CBC: Recent Labs  Lab 03/08/22 0053 03/08/22 0645 03/08/22 1819 03/08/22 1827 03/09/22 0515 03/10/22 0439 03/12/22 0542  WBC 7.5 7.2  --  9.3 8.8 6.7 5.3  NEUTROABS 4.8 5.3  --   --   --   --   --   HGB 11.8* 10.1* 11.9* 11.3* 10.8* 10.1* 9.9*  HCT 38.3* 32.5* 35.0* 36.3* 34.1* 32.2* 31.0*  MCV 81.7 81.7  --  81.8 81.2 80.1 81.2  PLT 199 119*  --  185 173 142* [id number]    Basic Metabolic Panel: Recent Labs  Lab 03/08/22 0053 03/08/22 0645 03/08/22 1819 03/08/22 1827 03/09/22 0515 03/10/22 0439 03/10/22 1711 03/11/22 0623 03/11/22 1628 03/12/22 0542  NA  --  139  138 137 138  --  140  --  139  --  142  K  --  4.1  4.4 3.8 3.8  --  3.7  --  4.4  --  4.7  CL  --  112*  111  --  107  --  108  --  109  --  111  CO2  --  25  24  --  23  --  24  --  24  --  25  GLUCOSE  --  88  86  --  163*  --  98  --  128*  --  88  BUN  --  29*  27*  --  24*  --  14  --  12  --  17  CREATININE  --  1.38*  1.42*  --  1.53*  --  1.34*  --  1.33*  --  1.38*  CALCIUM  --  7.9*  7.7*  --  8.3*  --  8.0*  --  8.0*  --  8.2*  MG  --   --   --  1.7 1.9  --  2.1 2.1 2.1 2.1  PHOS   < >  --   --  3.2 2.5  --  3.9 3.6 3.1 3.7   < > = values in this interval not displayed.    GFR: Estimated Creatinine Clearance: 66 mL/min (A) (by C-G formula based on SCr of 1.38 mg/dL (H)). Liver Function Tests: Recent Labs  Lab 03/08/22 0053 03/08/22 1827  AST 35 31  ALT 32 28  ALKPHOS 86 72  BILITOT 0.5 0.6  PROT 7.5 6.3*  ALBUMIN  3.7 2.8*    No results for input(s): "LIPASE", "AMYLASE" in the last 168 hours. No results for input(s): "AMMONIA" in the last 168 hours. Coagulation Profile: Recent Labs  Lab 03/08/22 0055  INR 1.1    Cardiac Enzymes: No results for input(s): "CKTOTAL", "CKMB", "CKMBINDEX", "TROPONINI" in the last 168 hours. BNP (last 3 results) No  results for input(s): "PROBNP" in the last 8760 hours. HbA1C: Recent Labs    03/11/22 0623  HGBA1C 5.5   CBG: Recent Labs  Lab 03/11/22 1203 03/11/22 1806 03/11/22 2214 03/12/22 0106 03/12/22 0418  GLUCAP 124* 118* 115* 95 83    Lipid Profile: No results for input(s): "CHOL", "HDL", "LDLCALC", "TRIG", "CHOLHDL", "LDLDIRECT" in the last 72 hours. Thyroid Function Tests: No results for input(s): "TSH", "T4TOTAL", "FREET4", "T3FREE", "THYROIDAB" in the last 72 hours. Anemia Panel: No results for input(s): "VITAMINB12", "FOLATE", "FERRITIN", "TIBC", "IRON", "RETICCTPCT" in the last 72 hours. Sepsis Labs: Recent Labs  Lab 03/08/22 0053 03/08/22 0054 03/08/22 0236 03/08/22 0453 03/08/22 0645  PROCALCITON <0.10  --   --   --   --   LATICACIDVEN  --  5.0* >9.0* 3.5* 2.0*     Recent Results (from the past 240 hour(s))  Resp Panel by RT-PCR (Flu A&B, Covid) Anterior Nasal Swab     Status: None   Collection Time: 03/08/22  1:17 AM   Specimen: Anterior Nasal Swab  Result Value Ref Range Status   SARS Coronavirus 2 by RT PCR NEGATIVE NEGATIVE Final    Comment: (NOTE) SARS-CoV-2 target nucleic acids are NOT DETECTED.  The SARS-CoV-2 RNA is generally detectable in upper respiratory specimens during the acute phase of infection. The lowest concentration of SARS-CoV-2 viral copies this assay can detect is 138 copies/mL. A negative result does not preclude SARS-Cov-2 infection and should not be used as the sole basis for treatment or other patient management decisions. A negative result may occur with  improper specimen  collection/handling, submission of specimen other than nasopharyngeal swab, presence of viral mutation(s) within the areas targeted by this assay, and inadequate number of viral copies(<138 copies/mL). A negative result must be combined with clinical observations, patient history, and epidemiological information. The expected result is Negative.  Fact Sheet for Patients:  BloggerCourse.com  Fact Sheet for Healthcare Providers:  SeriousBroker.it  This test is no t yet approved or cleared by the [place] FDA and  has been authorized for detection and/or diagnosis of SARS-CoV-2 by FDA under an Emergency Use Authorization (EUA). This EUA will remain  in effect (meaning this test can be used) for the duration of the COVID-19 declaration under Section 564(b)(1) of the Act, 21 U.S.C.section 360bbb-3(b)(1), unless the authorization is terminated  or revoked sooner.       Influenza A by PCR NEGATIVE NEGATIVE Final   Influenza B by PCR NEGATIVE NEGATIVE Final    Comment: (NOTE) The Xpert Xpress SARS-CoV-2/FLU/RSV plus assay is intended as an aid in the diagnosis of influenza from Nasopharyngeal swab specimens and should not be used as a sole basis for treatment. Nasal washings and aspirates are unacceptable for Xpert Xpress SARS-CoV-2/FLU/RSV testing.  Fact Sheet for Patients: BloggerCourse.com  Fact Sheet for Healthcare Providers: SeriousBroker.it  This test is not yet approved or cleared by the [place] FDA and has been authorized for detection and/or diagnosis of SARS-CoV-2 by FDA under an Emergency Use Authorization (EUA). This EUA will remain in effect (meaning this test can be used) for the duration of the COVID-19 declaration under Section 564(b)(1) of the Act, 21 U.S.C. section 360bbb-3(b)(1), unless the authorization is terminated or revoked.  Performed at [facility], [address] [place], [place] [address]   Blood Culture (routine x 2)     Status: None (Preliminary result)   Collection Time: 03/08/22  1:17 AM   Specimen: BLOOD  Result Value  Ref Range Status   Specimen Description BLOOD RIGHT WRIST  Final   Special Requests   Final    BOTTLES DRAWN AEROBIC AND ANAEROBIC Blood Culture adequate volume   Culture   Final    NO GROWTH 4 DAYS Performed at [facility], [address] [place], [place] [address]    Report Status PENDING  Incomplete  Blood Culture (routine x 2)     Status: None (Preliminary result)   Collection Time: 03/08/22  1:17 AM   Specimen: BLOOD  Result Value Ref Range Status   Specimen Description BLOOD RIGHT ASSIST CONTROL  Final   Special Requests   Final    BOTTLES DRAWN AEROBIC AND ANAEROBIC Blood Culture adequate volume   Culture   Final    NO GROWTH 4 DAYS Performed at [facility], [address] [place], [place] [address]    Report Status PENDING  Incomplete  Urine Culture     Status: Abnormal   Collection Time: 03/08/22  1:17 AM   Specimen: Urine, Random  Result Value Ref Range Status   Specimen Description   Final    URINE, RANDOM Performed at [facility], [address] [place], [place] [address]    Special Requests   Final    NONE Performed at [facility], [address] [place], [place] [address]    Culture (A)  Final    <10,000 COLONIES/mL INSIGNIFICANT GROWTH Performed at [facility], [place] [address] [place], [place] [address]    Report Status 03/09/2022 FINAL  Final  MRSA Next Gen by PCR, Nasal     Status: None   Collection Time: 03/08/22  5:21 PM   Specimen: Nasal Mucosa; Nasal Swab  Result Value Ref Range Status   MRSA by PCR Next Gen NOT DETECTED NOT DETECTED Final    Comment: (NOTE) The GeneXpert MRSA Assay (FDA approved for NASAL specimens only), is one component of a comprehensive MRSA colonization surveillance program.  It is not intended to diagnose MRSA infection nor to guide or monitor treatment for MRSA infections. Test performance is not FDA approved in patients less than 15 years old. Performed at [facility], [place] [address], [place], [place] [address]       Radiology Studies: DG Abd Portable 1V  Result Date: 03/10/2022 CLINICAL DATA:  Feeding tube placement. EXAM: PORTABLE ABDOMEN - 1 VIEW COMPARISON:  March 09, 2022. FINDINGS: The bowel gas pattern is normal. Distal tip of feeding tube is seen in expected position of the stomach. No radio-opaque calculi or other significant radiographic abnormality are seen. IMPRESSION: Distal tip of feeding tube seen in expected position of the stomach. Electronically Signed   By: Marijo Conception M.D.   On: 03/10/2022 10:06    Scheduled Meds:  Chlorhexidine Gluconate Cloth  6 each Topical Q0600   feeding supplement (PROSource TF)  45 mL Per Tube TID   heparin  5,000 Units Subcutaneous Q8H   insulin aspart  0-15 Units Subcutaneous Q4H   levETIRAcetam  1,500 mg Per Tube BID   ondansetron (ZOFRAN) IV  4 mg Intravenous Q6H   mouth rinse  15 mL Mouth Rinse Q2H   senna-docusate  1 tablet Per Tube BID   valproic acid  250 mg Per Tube QID   Continuous Infusions:  feeding supplement (OSMOLITE 1.5 CAL) 1,000 mL (03/10/22 2308)     LOS: 4 days   Time spent: 35 minutes   Cielle Aguila Loann Quill, MD  [facility]  If 7PM-7AM, please contact night-coverage [email or web address] 03/12/2022, 9:56 AM

## 2022-03-12 NOTE — Evaluation (Signed)
Clinical/Bedside Swallow Evaluation Patient Details  Name: Jeremy Browning MRN: [id number] Date of Birth: Dec 15, 1960  Today's Date: 03/12/2022 Time: SLP Start Time (ACUTE ONLY): 0900 SLP Stop Time (ACUTE ONLY): 0913 SLP Time Calculation (min) (ACUTE ONLY): 13 min  Past Medical History:  Past Medical History:  Diagnosis Date   CHF (congestive heart failure) (HCC)    Diabetes mellitus type II, non insulin dependent (HCC)    Essential hypertension [name]   h/o Proteus pneumonia (HCC) 02/17/2021   Heart failure (HCC)    Unknown details.  By report no cardiac surgery   Seizures (HCC)    Severe hypoxic-ischemic encephalopathy    Sleep apnea    Stroke [facility])    Venous stasis dermatitis of left lower extremity    Past Surgical History:  Past Surgical History:  Procedure Laterality Date   HARDWARE REMOVAL Left 02/10/2022   Procedure: HARDWARE REMOVAL LEFT TIBIA;  Surgeon: Nadara Mustard, MD;  Location: [facility] OR;  Service: Orthopedics;  Laterality: Left;   I & D EXTREMITY Left 02/10/2022   Procedure: DEBRIDEMENT OF LEFT TIBIA;  Surgeon: Nadara Mustard, MD;  Location: [facility] OR;  Service: Orthopedics;  Laterality: Left;   LEFT HEART CATH AND CORONARY ANGIOGRAPHY N/A 12/25/2020   Procedure: LEFT HEART CATH AND CORONARY ANGIOGRAPHY;  Surgeon: Marykay Lex, MD;  Location: [facility] INVASIVE CV LAB;  Service: Cardiovascular;  Laterality: N/A; (In setting of Acute Hypoxic and Hypercapnic Respiratory Failure with Flash Pulm Edema/Hypertensive Emergency) - Angiographically minimal CAD.  Moderately reduced LV function with EF of 35 to 45%. LV P-EDP 101/21 mmHg - 30 mmHg   LEFT HEART CATH AND CORONARY ANGIOGRAPHY  09/12/2018   [facility]): Ostial LAD ~20% otherwise normal coronary arteries. LVEDP 20 mmHg;- HTN Emergency related Type II MI>   PEG PLACEMENT N/A 01/11/2021   Procedure: PERCUTANEOUS ENDOSCOPIC GASTROSTOMY (PEG) PLACEMENT;  Surgeon: Midge Minium, MD;  Location: [facility] ENDOSCOPY;  Service: Endoscopy;   Laterality: N/A;   RIGHT HEART CATH N/A 12/25/2020   Procedure: RIGHT HEART CATH;  Surgeon: Marykay Lex, MD;  Location: [facility] INVASIVE CV LAB;  Service: Cardiovascular; R IJ: (Acute Hypoxic and Hypercapnic Respiratory Failure with Flash Pulm Edema/Hypertensive Emergency): CO-CI 6.54-2.82. Mild Secondary Pulmonary Hypertension (PCWP 30 mmHg.  PAP 47/13 mmHg with a mean PAP of 33 mmHg, RAP 22 mmHg in setting of AoP-MAP 97/59 mmHg - 71 mmHg,   TRACHEOSTOMY TUBE PLACEMENT N/A 01/12/2021   Procedure: TRACHEOSTOMY;  Surgeon: Bud Face, MD;  Location: [facility] ORS;  Service: ENT;  Laterality: N/A;   TRANSTHORACIC ECHOCARDIOGRAM  09/11/2018   [facility]: Moderately reduced LVEF ~35%. 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 Mild-mod AI, Trivial MR?TR. LVEDD 6.0 (reduced compared to 2017: EF ~40-45%, LVEDD 5.6   TRANSTHORACIC ECHOCARDIOGRAM  12/27/2020   [facility]: (In Setting of Acute Hypoxic and Hypercapnic Respiratory Failure with Flash Pulm Edema/Hypertensive Emergency)  EF 45-50%. Mildly reduced LVEF with global HK. Moderately dilated LV with Gr 1 DD - mild LA dilation. Normal RV size Fxn.  Normal Vavles   TRANSTHORACIC ECHOCARDIOGRAM  03/07/2021   [facility]: EF improved to 60-65% with Nl LV Fxn & ~ normal diastolic fxn. Normal RV size & fxn. Mild MR & AI.  Normal LA size.   Unknown     HPI:  Pt is a 61 y/o male admitted from SNF (LTC) secondary to seizures. Pt required intubation on 7/19 with extubation on 7/21. 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  Pt previously had a trach and PEG in 05/22.  He was decannulated and resumed oral diet in 06/22.  Pt reported that he was on a Dysphagia 2/thin liquid diet at previous facility.    Assessment / Plan / Recommendation  Clinical Impression  Pt was seen for a bedside swallow evaluation in the setting of recent extubation.  Pt  presents with oral dysphagia likely secondary to missing dentition (complete top, partial bottom).  Suspect functional pharyngoesophageal swallowing abilities.  Pt reported that he was on a Dysphagia 2/thin liquid diet at previous facility.  Oral motor examination was remarkable for L facial asymmetry, reduced L labial, lingual, and facial ROM, and general weakness.  Suspect that this is residual from previous CVA.  Pt consumed trials of thin liquid, puree, and regular solids.  Mastication of regular solids was moderately prolonged with oral residue noted.  Pt was sensate to residue and was able to clear independently with a liquid wash.  No overt s/sx of aspiration were observed with any PO trials.  Recommend diet change to Dysphagia 2 (finely chopped) solids and thin liquids with medication administered whole in liquid.  SLP will f/u to monitor diet tolerance.  SLP Visit Diagnosis: Dysphagia, oral phase (R13.11)    Aspiration Risk  Mild aspiration risk    Diet Recommendation Dysphagia 2 (Fine chop);Thin liquid   Liquid Administration via: Straw;Cup Medication Administration: Whole meds with liquid Supervision: Patient able to self feed;Intermittent supervision to cue for compensatory strategies Compensations: Small sips/bites;Minimize environmental distractions Postural Changes: Seated upright at 90 degrees    Other  Recommendations Oral Care Recommendations: Oral care BID    Recommendations for follow up therapy are one component of a multi-disciplinary discharge planning process, led by the attending physician.  Recommendations may be updated based on patient status, additional functional criteria and insurance authorization.  Follow up Recommendations No SLP follow up      Assistance Recommended at Discharge Frequent or constant Supervision/Assistance  Functional Status Assessment Patient has had a recent decline in their functional status and demonstrates the ability to make significant  improvements in function in a reasonable and predictable amount of time.  Frequency and Duration min 2x/week  2 weeks       Prognosis Prognosis for Safe Diet Advancement: Good      Swallow Study   General HPI: Pt is a 61 y/o male admitted from SNF (LTC) secondary to seizures. Pt required intubation on 7/19 with extubation on 7/21. 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  Pt previoiusly had a trach and PEG in 05/22.  He was decannulated and resumed oral diet in 06/22.  Pt reported that he was on a Dysphagia 2/thin liquid diet at previous facility. Type of Study: Bedside Swallow Evaluation Previous Swallow Assessment: BSE 02/02/21 Diet Prior to this Study: Regular;Thin liquids;NG Tube Temperature Spikes Noted: No Respiratory Status: Room air History of Recent Intubation: Yes Length of Intubations (days): 2 days Date extubated: 03/10/22 Behavior/Cognition: Alert;Cooperative;Pleasant mood Oral Cavity Assessment: Within Functional Limits Oral Care Completed by SLP: No Oral Cavity - Dentition: Missing dentition;Poor condition Vision: Functional for self-feeding Self-Feeding Abilities: Needs set up Patient Positioning: Upright in bed Baseline Vocal Quality: Normal Volitional Cough: Strong Volitional Swallow: Able to elicit    Oral/Motor/Sensory Function Overall Oral Motor/Sensory Function: Mild impairment Facial ROM: Reduced  left;Suspected CN VII (facial) dysfunction Facial Symmetry: Abnormal symmetry left;Suspected CN VII (facial) dysfunction Facial Strength: Reduced left Facial Sensation: Within Functional Limits Lingual ROM: Reduced right;Reduced left;Suspected CN XII (hypoglossal) dysfunction Lingual Symmetry: Abnormal symmetry right;Suspected CN XII (hypoglossal) dysfunction Lingual Strength: Reduced   Ice Chips Ice chips: Not tested   Thin Liquid Thin Liquid:  Within functional limits Presentation: Self Fed;Straw    Nectar Thick Nectar Thick Liquid: Not tested   Honey Thick Honey Thick Liquid: Not tested   Puree Puree: Within functional limits Presentation: Spoon   Solid     Solid: Impaired Presentation: Self Fed Oral Phase Impairments: Impaired mastication Oral Phase Functional Implications: Impaired mastication;Oral residue     Eino Farber, M.S., CCC-SLP Acute Rehabilitation Services Office: [phone]  Shanon Rosser Melaney Tellefsen 03/12/2022,9:29 AM

## 2022-03-13 DIAGNOSIS — J449 Chronic obstructive pulmonary disease, unspecified: Secondary | ICD-10-CM

## 2022-03-13 DIAGNOSIS — I248 Other forms of acute ischemic heart disease: Secondary | ICD-10-CM

## 2022-03-13 DIAGNOSIS — E785 Hyperlipidemia, unspecified: Secondary | ICD-10-CM

## 2022-03-13 DIAGNOSIS — E119 Type 2 diabetes mellitus without complications: Secondary | ICD-10-CM

## 2022-03-13 DIAGNOSIS — N179 Acute kidney failure, unspecified: Secondary | ICD-10-CM

## 2022-03-13 DIAGNOSIS — N1831 Chronic kidney disease, stage 3a: Secondary | ICD-10-CM

## 2022-03-13 DIAGNOSIS — J690 Pneumonitis due to inhalation of food and vomit: Secondary | ICD-10-CM | POA: Diagnosis not present

## 2022-03-13 DIAGNOSIS — R569 Unspecified convulsions: Secondary | ICD-10-CM | POA: Diagnosis not present

## 2022-03-13 DIAGNOSIS — I952 Hypotension due to drugs: Secondary | ICD-10-CM

## 2022-03-13 DIAGNOSIS — R1312 Dysphagia, oropharyngeal phase: Secondary | ICD-10-CM

## 2022-03-13 DIAGNOSIS — I447 Left bundle-branch block, unspecified: Secondary | ICD-10-CM

## 2022-03-13 DIAGNOSIS — J9601 Acute respiratory failure with hypoxia: Secondary | ICD-10-CM | POA: Diagnosis not present

## 2022-03-13 DIAGNOSIS — I10 Essential (primary) hypertension: Secondary | ICD-10-CM

## 2022-03-13 DIAGNOSIS — G40919 Epilepsy, unspecified, intractable, without status epilepticus: Secondary | ICD-10-CM

## 2022-03-13 DIAGNOSIS — I872 Venous insufficiency (chronic) (peripheral): Secondary | ICD-10-CM

## 2022-03-13 LAB — BASIC METABOLIC PANEL
Anion gap: 7 (ref 5–15)
BUN: 20 mg/dL (ref 8–23)
CO2: 26 mmol/L (ref 22–32)
Calcium: 8.7 mg/dL — ABNORMAL LOW (ref 8.9–10.3)
Chloride: 106 mmol/L (ref 98–111)
Creatinine, Ser: 1.45 mg/dL — ABNORMAL HIGH (ref 0.61–1.24)
GFR, Estimated: 54 mL/min — ABNORMAL LOW (ref >60)
Glucose, Bld: 114 mg/dL — ABNORMAL HIGH (ref 70–99)
Potassium: 4.6 mmol/L (ref 3.5–5.1)
Sodium: 139 mmol/L (ref 135–145)

## 2022-03-13 LAB — CULTURE, BLOOD (ROUTINE X 2)
Culture: NO GROWTH
Culture: NO GROWTH
Special Requests: ADEQUATE
Special Requests: ADEQUATE

## 2022-03-13 LAB — GLUCOSE, CAPILLARY
Glucose-Capillary: 100 mg/dL — ABNORMAL HIGH (ref 70–99)
Glucose-Capillary: 109 mg/dL — ABNORMAL HIGH (ref 70–99)
Glucose-Capillary: 109 mg/dL — ABNORMAL HIGH (ref 70–99)
Glucose-Capillary: 89 mg/dL (ref 70–99)
Glucose-Capillary: 97 mg/dL (ref 70–99)

## 2022-03-13 LAB — CBC
HCT: 36.9 % — ABNORMAL LOW (ref 39.0–52.0)
Hemoglobin: 11.6 g/dL — ABNORMAL LOW (ref 13.0–17.0)
MCH: 25.4 pg — ABNORMAL LOW (ref 26.0–34.0)
MCHC: 31.4 g/dL (ref 30.0–36.0)
MCV: 80.9 fL (ref 80.0–100.0)
Platelets: 190 10*3/uL (ref 150–400)
RBC: 4.56 MIL/uL (ref 4.22–5.81)
RDW: 14 % (ref 11.5–15.5)
WBC: 5.5 10*3/uL (ref 4.0–10.5)
nRBC: 0 % (ref 0.0–0.2)

## 2022-03-13 LAB — VALPROIC ACID LEVEL: Valproic Acid Lvl: 51 ug/mL (ref 50.0–100.0)

## 2022-03-13 MED ORDER — DIVALPROEX SODIUM 500 MG PO DR TAB: 500.0000 mg | DELAYED_RELEASE_TABLET | Freq: Two times a day (BID) | ORAL | Status: DC

## 2022-03-13 MED ORDER — ALBUTEROL SULFATE (2.5 MG/3ML) 0.083% IN NEBU
2.5000 mg | INHALATION_SOLUTION | Freq: Once | RESPIRATORY_TRACT | Status: AC
  Administered 2022-03-13: 2.5 mg via RESPIRATORY_TRACT
  Filled 2022-03-13: qty 3

## 2022-03-13 MED ORDER — ORAL CARE MOUTH RINSE
15.0000 mL | OROMUCOSAL | Status: DC | PRN
Start: 2022-03-13 — End: 2022-03-13

## 2022-03-13 MED ORDER — LEVETIRACETAM 750 MG PO TABS: 1500.0000 mg | ORAL_TABLET | Freq: Two times a day (BID) | ORAL | Status: DC

## 2022-03-13 NOTE — Plan of Care (Signed)
  Problem: Activity: Goal: Ability to tolerate increased activity will improve Outcome: Adequate for Discharge   Problem: Respiratory: Goal: Ability to maintain a clear airway and adequate ventilation will improve Outcome: Adequate for Discharge   Problem: Role Relationship: Goal: Method of communication will improve Outcome: Adequate for Discharge   Problem: Education: Goal: Knowledge of General Education information will improve Description: Including pain rating scale, medication(s)/side effects and non-pharmacologic comfort measures Outcome: Adequate for Discharge   Problem: Health Behavior/Discharge Planning: Goal: Ability to manage health-related needs will improve Outcome: Adequate for Discharge   Problem: Clinical Measurements: Goal: Ability to maintain clinical measurements within normal limits will improve Outcome: Adequate for Discharge Goal: Will remain free from infection Outcome: Adequate for Discharge Goal: Diagnostic test results will improve Outcome: Adequate for Discharge Goal: Respiratory complications will improve Outcome: Adequate for Discharge Goal: Cardiovascular complication will be avoided Outcome: Adequate for Discharge   Problem: Activity: Goal: Risk for activity intolerance will decrease Outcome: Adequate for Discharge   Problem: Nutrition: Goal: Adequate nutrition will be maintained Outcome: Adequate for Discharge   Problem: Coping: Goal: Level of anxiety will decrease Outcome: Adequate for Discharge   Problem: Elimination: Goal: Will not experience complications related to bowel motility Outcome: Adequate for Discharge Goal: Will not experience complications related to urinary retention Outcome: Adequate for Discharge   Problem: Pain Managment: Goal: General experience of comfort will improve Outcome: Adequate for Discharge   Problem: Safety: Goal: Ability to remain free from injury will improve Outcome: Adequate for Discharge    Problem: Skin Integrity: Goal: Risk for impaired skin integrity will decrease Outcome: Adequate for Discharge   Problem: Education: Goal: Ability to describe self-care measures that may prevent or decrease complications (Diabetes Survival Skills Education) will improve Outcome: Adequate for Discharge Goal: Individualized Educational Video(s) Outcome: Adequate for Discharge   Problem: Coping: Goal: Ability to adjust to condition or change in health will improve Outcome: Adequate for Discharge   Problem: Fluid Volume: Goal: Ability to maintain a balanced intake and output will improve Outcome: Adequate for Discharge   Problem: Health Behavior/Discharge Planning: Goal: Ability to identify and utilize available resources and services will improve Outcome: Adequate for Discharge Goal: Ability to manage health-related needs will improve Outcome: Adequate for Discharge   Problem: Metabolic: Goal: Ability to maintain appropriate glucose levels will improve Outcome: Adequate for Discharge   Problem: Nutritional: Goal: Maintenance of adequate nutrition will improve Outcome: Adequate for Discharge Goal: Progress toward achieving an optimal weight will improve Outcome: Adequate for Discharge   Problem: Skin Integrity: Goal: Risk for impaired skin integrity will decrease Outcome: Adequate for Discharge   Problem: Tissue Perfusion: Goal: Adequacy of tissue perfusion will improve Outcome: Adequate for Discharge   

## 2022-03-13 NOTE — Progress Notes (Signed)
Patient discharged at this time to [facility] via [facility].  No s/s of distress noted denies pain.no belongings at bedside.  Sister Marylene Land called and made aware.

## 2022-03-13 NOTE — Discharge Summary (Signed)
Physician Discharge Summary   Patient: Jeremy Browning MRN: [id number] DOB: 01-29-1961  Admit date:     03/08/2022  Discharge date: 03/13/22  Discharge Physician: Patrecia Pour   PCP: Townsend Roger, MD   Recommendations at discharge:   Follow up valpro levels, follow up with neurology.   Discharge Diagnoses: Principal Problem:   Seizure [facility]) Active Problems:   Breakthrough seizure ([place])   Acute respiratory failure with hypoxia (HCC)   AKI (acute kidney injury) ([place])   Oropharyngeal dysphagia   Essential hypertension   Morbid obesity (HCC)   Complete left bundle branch block (LBBB)   Chronic venous insufficiency -Left Leg > Right.   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 He resides in SNF where he developed seizures in the early AM hours of 7/19. Upon EMS arrival the seizure was still in process (~20 minutes) with tonic clonic movements. He was given intranasal versed with cessation of sz, but shortly after began seizing again. Upon arrival to ED he was intubated for airway protection and hypoxia with sats of 81% on room air. CT head was non-acute. He was admitted to the ICU service for status epilepticus. Spot EEG negative. Course complicated by bradycardia requiring atropine, hypotension requiring norepinephrine, and elevated troponin which plateaued and has since began to downtrend. The patient was transferred to [facility] for continuous EEG.  Neurology consulted and AED broadened to Keppra and VPA.  He  extubated on 7/21 and transferred to the floor where he has remained stable. He will return to long term care facility at discharge.  Assessment and Plan: Seizure disorder with breakthrough seizures: -Continue Keppra 1,500mg  BID, continue depakote DR 500mg  BID (level is 51 on 7/24) -Appreciate neurology recommendation. -Seizure precautions/fall precautions   Acute hypoxemic respiratory failure due to aspiration pneumonia: -Inability to protect airway in the setting of breakthrough seizures -Required intubation on admission.  Extubated on 7/21.   -Currently on room air and doing well s/p 5 day antibiotics   Hypertension: Hypotensive initially. - Continue coreg and losartan    Chronic diastolic CHF NSTEMI, type 2 due to demand myocardial ischemia.  Left bundle branch block: - Troponin downtrending. Patient denies any chest pain.  Echo shows ejection fraction of 50 to 55%, mild concentric LVH, mild AR - Outpatient cardiac evaluation recommended   AKI on CKD stage IIIa: CrCl improved into range 45-82ml/min. -Renal function improved, stable.   Type 2 diabetes: Well-controlled, HbA1c 5.5%.    Inadequate p.o. intake: Resolved. - Continue dysphagia 2 diet per SLP recommendations for oropharyngeal dysphagia.   Normocytic anemia: Hgb stable between 10-11. 11.6g/dl on day of discharge.   Thrombocytopenia: Resolved   Morbid obesity with BMI of 37: Diet modification/exercise recommended  COPD:  - prn albuterol given while inpatient, consider initiation of maintenance medications at facility.  Venous insufficiency: Stable. Elevation and compression recommended.   Consultants: Neurology, PCCM Procedures performed: EEG, intubation  Disposition: Long term care facility Diet recommendation: Dysphagia 2 carb-modified, cardiac DISCHARGE MEDICATION: Allergies as of 03/13/2022   No  Known Allergies      Medication List     STOP taking these medications    Ampicillin-Sulbactam 3 g in  sodium chloride 0.9 % 100 mL   docusate 50 MG/5ML liquid Commonly known as: COLACE   enoxaparin 60 MG/0.6ML injection Commonly known as: LOVENOX   feeding supplement (NEPRO CARB STEADY) Liqd   fentaNYL 10 mcg/ml Soln infusion   insulin aspart 100 UNIT/ML injection Commonly known as: novoLOG   lactated ringers infusion   levETIRacetam 1500 MG/100ML Soln Commonly known as: KEPPRA   midazolam-sodium chloride 100-0.9 MG/100ML-% Soln   norepinephrine 4-5 MG/250ML-% Soln Commonly known as: LEVOPHED   oxyCODONE-acetaminophen 5-325 MG tablet Commonly known as: PERCOCET/ROXICET   polyethylene glycol 17 g packet Commonly known as: MIRALAX / GLYCOLAX   propofol 1000 MG/100ML Emul injection Commonly known as: DIPRIVAN   sodium chloride 0.9 % infusion       TAKE these medications    aspirin EC 81 MG tablet Take 81 mg by mouth daily.   atorvastatin 80 MG tablet Commonly known as: LIPITOR Take 80 mg by mouth daily.   carvedilol 6.25 MG tablet Commonly known as: COREG Take 6.25 mg by mouth 2 (two) times daily.   diclofenac Sodium 1 % Gel Commonly known as: VOLTAREN Apply 2 g topically 4 (four) times daily.   divalproex 500 MG DR tablet Commonly known as: Depakote Take 1 tablet (500 mg total) by mouth 2 (two) times daily.   Effer-K 10 MEQ Tbef Generic drug: Potassium Bicarb-Citric Acid Take 10 mEq by mouth daily.   furosemide 20 MG tablet Commonly known as: LASIX Take 20 mg by mouth daily.   levETIRAcetam 750 MG tablet Commonly known as: KEPPRA Take 2 tablets (1,500 mg total) by mouth 2 (two) times daily. What changed:  medication strength how much to take   losartan 25 MG tablet Commonly known as: COZAAR Take 25 mg by mouth daily.   sertraline 25 MG tablet Commonly known as: ZOLOFT Take 25 mg by mouth daily.   tamsulosin 0.4 MG Caps capsule Commonly known as: FLOMAX Take 0.4 mg by mouth daily.        Contact information for follow-up providers      Leonie Man, MD Follow up.   Specialty: Cardiology Contact information: [address] [place] [address] [phone]         Townsend Roger, MD .   Specialty: Internal Medicine Contact information: [place] [address] [phone]              Contact information for after-discharge care     [facility] .   Service: Skilled Nursing Contact information: [place] [phone]                    Discharge Exam: [name] Weights   03/11/22 0414 03/12/22 0500 03/13/22 0500  Weight: 109.2 kg 111 kg 108.1 kg  BP (!) 163/64 (BP Location: Left Arm)   Pulse 64   Temp 99.3 F (37.4 C) (Oral)   Resp 18   Wt 108.1 kg   SpO2 100%   BMI 37.33 kg/m   Nontoxic male in no distress, pleasantly interactive Clear, slight end-expiratory wheeze in som lung fields, normal I:E and effort RRR, no MRG. Stable chronic LE edema per pt.  Condition at discharge: stable  The results of significant diagnostics from this hospitalization (  including imaging, microbiology, ancillary and laboratory) are listed below for reference.   Imaging Studies: DG Abd Portable 1V  Result Date: 03/10/2022 CLINICAL DATA:  Feeding tube placement. EXAM: PORTABLE ABDOMEN - 1 VIEW COMPARISON:  March 09, 2022. FINDINGS: The bowel gas pattern is normal. Distal tip of feeding tube is seen in expected position of the stomach. No radio-opaque calculi or other significant radiographic abnormality are seen. IMPRESSION: Distal tip of feeding tube seen in expected position of the stomach. Electronically Signed   By: Marijo Conception M.D.   On: 03/10/2022 10:06   DG Abd Portable 1V  Result Date: 03/09/2022 CLINICAL DATA:  NG tube EXAM: PORTABLE ABDOMEN - 1 VIEW COMPARISON:  03/08/2022 FINDINGS: Esophageal tube is looped within the stomach, the tip is in the mid proximal stomach. Gas  distended bowel in the upper abdomen. IMPRESSION: Esophageal tube is looped in the stomach with the tip positioned over the body of the stomach Electronically Signed   By: Donavan Foil M.D.   On: 03/09/2022 20:39   ECHOCARDIOGRAM LIMITED  Result Date: 03/09/2022    ECHOCARDIOGRAM LIMITED REPORT   Patient Name:   Jeremy Browning Date of Exam: 03/09/2022 Medical Rec #:  [id number]      Height:       67.0 in Accession #:    [id number]     Weight:       243.2 lb Date of Birth:  1960/11/19       BSA:          2.197 m Patient Age:    70 years       BP:           148/44 mmHg Patient Gender: M              HR:           53 bpm. Exam Location:  Inpatient Procedure: Limited Echo, Cardiac Doppler and Color Doppler Indications:    R06.02 SOB  History:        Patient has prior history of Echocardiogram examinations, most                 recent 11/07/2021. Cardiomyopathy, CAD and Previous Myocardial                 Infarction, Abnormal ECG, Stroke, Arrythmias:LBBB; Risk                 Factors:Current Smoker.  Sonographer:    Roseanna Rainbow RDCS Referring Phys: Silvestre Moment [facility]  Sonographer Comments: Technically difficult study due to poor echo windows, echo performed with patient supine and on artificial respirator and patient is morbidly obese. Image acquisition challenging due to patient body habitus. IMPRESSIONS  1. Left ventricular ejection fraction, by estimation, is 50 to 55%. The left ventricle has low normal function. There is mild concentric left ventricular hypertrophy.  2. Trivial mitral valve regurgitation.  3. The aortic valve is tricuspid. There is mild calcification of the aortic valve. There is mild thickening of the aortic valve. Aortic valve regurgitation is mild.  4. There is normal pulmonary artery systolic pressure.  5. The inferior vena cava is dilated in size with <50% respiratory variability, suggesting right atrial pressure of 15 mmHg. FINDINGS  Left Ventricle: Left ventricular ejection fraction, by estimation,  is 50 to 55%. The left ventricle has low normal function. There is mild concentric left ventricular hypertrophy. Right Ventricle: There is normal pulmonary artery systolic pressure. The tricuspid regurgitant velocity is 2.15 m/s,  and with an assumed right atrial pressure of 15 mmHg, the estimated right ventricular systolic pressure is [id number] mmHg. Mitral Valve: Trivial mitral valve regurgitation. Tricuspid Valve: Tricuspid valve regurgitation is trivial. Aortic Valve: The aortic valve is tricuspid. There is mild calcification of the aortic valve. There is mild thickening of the aortic valve. Aortic valve regurgitation is mild. Venous: The inferior vena cava is dilated in size with less than 50% respiratory variability, suggesting right atrial pressure of 15 mmHg. LEFT VENTRICLE PLAX 2D LVIDd:         5.40 cm LVIDs:         4.00 cm LV PW:         1.10 cm LV IVS:        1.10 cm  LV Volumes (MOD) LV vol d, MOD A2C: 129.0 ml LV vol d, MOD A4C: 140.0 ml LV vol s, MOD A2C: 62.0 ml LV vol s, MOD A4C: 63.7 ml LV SV MOD A2C:     67.0 ml LV SV MOD A4C:     140.0 ml LV SV MOD BP:      70.6 ml IVC IVC diam: 2.80 cm LEFT ATRIUM         Index LA diam:    3.60 cm 1.64 cm/m   AORTA Ao Root diam: 3.00 cm Ao Asc diam:  3.30 cm TRICUSPID VALVE TR Peak grad:   18.5 mmHg TR Vmax:        215.00 cm/s Skeet Latch MD Electronically signed by Skeet Latch MD Signature Date/Time: 03/09/2022/2:12:00 PM    Final    Overnight EEG with video  Result Date: 03/09/2022 Lora Havens, MD     03/10/2022  8:31 AM Patient Name: Jeremy Browning MRN: [id number] Epilepsy Attending: Lora Havens Referring Physician/Provider: Corey Harold, NP Duration: 03/08/2022 2009 to 03/09/2022 2009 Patient history: 60 y.o. male with a history of seizures who is undergoing an EEG to evaluate for seizures. Level of alertness:  lethargic/sedated AEDs during EEG study: LEV, VPA, versed Technical aspects: This EEG study was done with scalp electrodes  positioned according to the 10-20 International system of electrode placement. Electrical activity was acquired at a sampling rate of 500Hz  and reviewed with a high frequency filter of 70Hz  and a low frequency filter of 1Hz . EEG data were recorded continuously and digitally stored. Description: EEG showed continuous 3 to 7 Hz theta- delta slowing admixed with 12 to 14 Hz beta activity in left hemisphere.  There is also 3 to 5 Hz theta-delta slowing in right hemisphere.  Hyperventilation and photic stimulation were not performed.   ABNORMALITY -Continuous slow, generalized and lateralized right hemisphere IMPRESSION: This study is suggestive of cortical dysfunction in right hemisphere likely secondary to underlying structural abnormality/stroke.  Additionally there is moderate diffuse encephalopathy, nonspecific etiology. No seizures or epileptiform discharges were seen throughout the recording. Priyanka Barbra Sarks   DG CHEST PORT 1 VIEW  Result Date: 03/08/2022 CLINICAL DATA:  Intubated patient, post transportation. EXAM: PORTABLE CHEST 1 VIEW COMPARISON:  March 08, 2022 FINDINGS: The endotracheal tube terminates 2 cm above the carina, as before. Central venous line and enteric catheter is stable. Stable cardiac silhouette. Calcific atherosclerotic disease of the aorta. Increased interstitial markings. Bibasilar streaky airspace opacities. Osseous structures are without acute abnormality. Soft tissues are grossly normal. IMPRESSION: Support lines and tubes as described. Interstitial pulmonary edema. Atelectasis versus airspace consolidation in bilateral lung bases. Electronically Signed   By: Fidela Salisbury M.D.   On: 03/08/2022  17:20   DG Chest Port 1 View  Result Date: 03/08/2022 CLINICAL DATA:  Central line placement EXAM: PORTABLE CHEST 1 VIEW COMPARISON:  03/08/2022 at 1:14 a.m. FINDINGS: Single frontal view of the chest demonstrates endotracheal tube overlying tracheal air column tip just below  thoracic inlet. Enteric catheter passes below diaphragm, coiled over stomach but tip excluded by collimation. Left internal jugular catheter tip overlies the superior vena cava. Cardiac silhouette is enlarged. Continued central vascular congestion, with developing bibasilar airspace disease right greater than left. No effusion or pneumothorax. IMPRESSION: 1. Support devices as above. 2. Developing bibasilar consolidation, right greater than left, which may reflect progressive edema, infection, or hypoventilatory change. Electronically Signed   By: Randa Ngo M.D.   On: 03/08/2022 15:40   EEG adult  Result Date: 03/08/2022 Derek Jack, MD     03/08/2022  2:51 PM Routine EEG Report Steave Latronica is a 61 y.o. male with a history of seizures who is undergoing an EEG to evaluate for seizures. Report: This EEG was acquired with electrodes placed according to the International 10-20 electrode system (including Fp1, Fp2, F3, F4, C3, C4, P3, P4, O1, O2, T3, T4, T5, T6, A1, A2, Fz, Cz, Pz). The following electrodes were missing or displaced: none. The best background was 5-6 Hz with superimposed focal slowing over the right hemisphere. There was no clear waking rhythm nor sleep architecture identified. There was muscle artifact throughout the study that somewhat limited ability to interpret however with that caveat no electrographic seizures or other epileptiform abnormalities were identified. Impression and clinical correlation: This EEG was obtained while sedated on versed and fentanyl and was abnormal due to moderate diffuse slowing and superimposed focal slowing over the right hemisphere in the region of his remote infarct. Epileptiform abnormalities were not seen during this recording. Su Monks, MD [facility] [phone] If 7pm- 7am, please page neurology on call as listed in [place].   [place] EKG SITE RITE  Result Date: 03/08/2022 If Site Rite image not attached, placement could not be  confirmed due to current cardiac rhythm.  DG Abd Portable 1 View  Result Date: 03/08/2022 CLINICAL DATA:  OG placement. EXAM: PORTABLE ABDOMEN - 1 VIEW COMPARISON:  None Available. FINDINGS: Enteric tube with tip in the body of the stomach. There is moderate stool the visualized colon. Degenerative changes of the spine. No acute osseous pathology. IMPRESSION: Enteric tube with tip in the body of the stomach. Electronically Signed   By: Anner Crete M.D.   On: 03/08/2022 03:00   CT HEAD WO CONTRAST (5MM)  Result Date: 03/08/2022 CLINICAL DATA:  Seizure, status epilepticus EXAM: CT HEAD WITHOUT CONTRAST TECHNIQUE: Contiguous axial images were obtained from the base of the skull through the vertex without intravenous contrast. RADIATION DOSE REDUCTION: This exam was performed according to the departmental dose-optimization program which includes automated exposure control, adjustment of the mA and/or kV according to patient size and/or use of iterative reconstruction technique. COMPARISON:  11/06/2021 FINDINGS: Brain: Stable encephalomalacia involving the right cerebral hemisphere in keeping with a remote large right MCA territory infarct. Compensatory ex vacuo dilation of the right lateral ventricle. Stable encephalomalacia within the left parieto-occipital cortex again in keeping with remote infarct. No evidence of acute intracranial hemorrhage or infarct. No abnormal mass effect or midline shift. No abnormal intra or extra-axial mass lesion or fluid collection. Ventricular size is normal. Cerebellum is unremarkable. Vascular: No asymmetric hyperdense vasculature at the skull base. Skull: Intact Sinuses/Orbits: Mucous retention cysts are  seen within the maxillary sinuses bilaterally. Remaining paranasal sinuses are clear. Orbits are unremarkable. Other: Mastoid air cells and middle ear cavities are clear. IMPRESSION: No acute intracranial abnormality. Stable remote infarcts. Electronically Signed   By:  Helyn Numbers M.D.   On: 03/08/2022 02:14   DG Chest Port 1 View  Result Date: 03/08/2022 CLINICAL DATA:  Questionable sepsis - evaluate for abnormality Intubation.  Seizure. EXAM: PORTABLE CHEST 1 VIEW COMPARISON:  11/07/2018 FINDINGS: Endotracheal tube tip 3.4 cm from the carina. There is patchy airspace disease throughout the right lung greatest in the infrahilar region. Lung volumes are low. Upper normal heart size, stable mediastinal contours. Aortic atherosclerosis. No pneumothorax or large pleural effusion. The right costophrenic angle is not included in the field of view. IMPRESSION: 1. Endotracheal tube tip 3.4 cm from the carina. 2. Patchy airspace disease throughout the right lung greatest in the infrahilar region. Findings may represent pneumonia, aspiration or less likely asymmetric pulmonary edema. Electronically Signed   By: Narda Rutherford M.D.   On: 03/08/2022 01:28    Microbiology: Results for orders placed or performed during the hospital encounter of 03/08/22  MRSA Next Gen by PCR, Nasal     Status: None   Collection Time: 03/08/22  5:21 PM   Specimen: Nasal Mucosa; Nasal Swab  Result Value Ref Range Status   MRSA by PCR Next Gen NOT DETECTED NOT DETECTED Final    Comment: (NOTE) The GeneXpert MRSA Assay (FDA approved for NASAL specimens only), is one component of a comprehensive MRSA colonization surveillance program. It is not intended to diagnose MRSA infection nor to guide or monitor treatment for MRSA infections. Test performance is not FDA approved in patients less than 26 years old. Performed at [facility], [address], [place], [place] [address]     Labs: CBC: Recent Labs  Lab 03/08/22 0053 03/08/22 0645 03/08/22 1819 03/08/22 1827 03/09/22 0515 03/10/22 0439 03/12/22 0542 03/13/22 0656  WBC 7.5 7.2  --  9.3 8.8 6.7 5.3 5.5  NEUTROABS 4.8 5.3  --   --   --   --   --   --   HGB 11.8* 10.1*   < > 11.3* 10.8* 10.1* 9.9* 11.6*  HCT 38.3*  32.5*   < > 36.3* 34.1* 32.2* 31.0* 36.9*  MCV 81.7 81.7  --  81.8 81.2 80.1 81.2 80.9  PLT 199 119*  --  185 173 142* 164 190   < > = values in this interval not displayed.   Basic Metabolic Panel: Recent Labs  Lab 03/08/22 1827 03/09/22 0515 03/10/22 0439 03/10/22 1711 03/11/22 5573 03/11/22 1628 03/12/22 0542 03/13/22 0656  NA 138  --  140  --  139  --  142 139  K 3.8  --  3.7  --  4.4  --  4.7 4.6  CL 107  --  108  --  109  --  111 106  CO2 23  --  24  --  24  --  25 26  GLUCOSE 163*  --  98  --  128*  --  88 114*  BUN 24*  --  14  --  12  --  17 20  CREATININE 1.53*  --  1.34*  --  1.33*  --  1.38* 1.45*  CALCIUM 8.3*  --  8.0*  --  8.0*  --  8.2* 8.7*  MG 1.7 1.9  --  2.1 2.1 2.1 2.1  --  PHOS 3.2 2.5  --  3.9 3.6 3.1 3.7  --    Liver Function Tests: Recent Labs  Lab 03/08/22 0053 03/08/22 1827  AST 35 31  ALT 32 28  ALKPHOS 86 72  BILITOT 0.5 0.6  PROT 7.5 6.3*  ALBUMIN 3.7 2.8*   CBG: Recent Labs  Lab 03/12/22 1308 03/12/22 1650 03/12/22 2045 03/13/22 0025 03/13/22 0410  GLUCAP 98 89 137* 109* 100*    Discharge time spent: greater than 30 minutes.  Signed: Tyrone Nine, MD [facility] 03/13/2022

## 2022-03-13 NOTE — TOC Transition Note (Signed)
Transition of Care [facility]) - CM/SW Discharge Note   Patient Details  Name: Jeremy Browning MRN: [id number] Date of Birth: [birth year]  Transition of Care [facility] CM/SW Contact:  Baldemar Lenis, LCSW Phone Number: 03/13/2022, 1:46 PM   Clinical Narrative:   CSW alerted by MD that patient is stable to return to LTC today. CSW sent discharge summary to [facility] and left a voicemail for brother, Barbara Cower, to update about return to SNF. Transport scheduled with [facility] for next available.  Nurse to call report to [phone], Room 46A.    Final next level of care: Skilled Nursing Facility Barriers to Discharge: Barriers Resolved   Patient Goals and CMS Choice     Choice offered to / list presented to : Sibling  Discharge Placement              Patient chooses bed at: [facility] Patient to be transferred to facility by: [facility] Name of family member notified: Voicemail for [facility] Patient and family notified of of transfer: 03/13/22  Discharge Plan and Services                                     Social Determinants of Health (SDOH) Interventions     Readmission Risk Interventions     No data to display

## 2022-03-13 NOTE — Progress Notes (Signed)
Physical Therapy Treatment Patient Details Name: Jeremy Browning MRN: [id number] DOB: May 23, 1961 Today's Date: 03/13/2022   History of Present Illness Pt is a 61 y/o male admitted from SNF (LTC) secondary to seizures. Pt required intubation, extubated on 7/21. 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    PT Comments    Pt with noted delayed processing, impulsivity, impaired sequencing, L hemiplegia, impaired balance, and strength requiring mod/maxA for all transfers. Worked with hemiwalker today for sit to stand and step pvt to chair. Pt continue to be appropriate to return to SNF upon d/c. Acute PT to cont to follow.    Recommendations for follow up therapy are one component of a multi-disciplinary discharge planning process, led by the attending physician.  Recommendations may be updated based on patient status, additional functional criteria and insurance authorization.  Follow Up Recommendations  Skilled nursing-short term rehab (<3 hours/day) Can patient physically be transported by private vehicle: No   Assistance Recommended at Discharge Frequent or constant Supervision/Assistance  Patient can return home with the following Two people to help with walking and/or transfers;Two people to help with bathing/dressing/bathroom;Assistance with cooking/housework;Assist for transportation   Equipment Recommendations  None recommended by PT    Recommendations for Other Services       Precautions / Restrictions Precautions Precautions: Fall Precaution Comments: L hemi Restrictions Weight Bearing Restrictions: No     Mobility  Bed Mobility Overal bed mobility: Needs Assistance Bed Mobility: Supine to Sit     Supine to sit: Min assist     General bed mobility comments: min A  for management of L hemibody and to adjust hips towards EOB, HOB elevated     Transfers Overall transfer level: Needs assistance Equipment used: Hemi-walker Transfers: Sit to/from Stand, Bed to chair/wheelchair/BSC Sit to Stand: Max assist   Step pivot transfers: Mod assist, Max assist, +2 physical assistance       General transfer comment: stood x 5, up to hemiwalker, max directional veral cues and modAx2 to power up from chair, maxAX2 to power up from bed, maxA to advance L LE during step pvt transfers to chair, modA for hemi walker management during transfer    Ambulation/Gait               General Gait Details: worked on attempting to march in place with hemiwalker however pt unable to clear L LE without assist, completed lateral shifting   Stairs             Wheelchair Mobility    Modified Rankin (Stroke Patients Only) Modified Rankin (Stroke Patients Only) Pre-Morbid Rankin Score: Moderately severe disability Modified Rankin: Severe disability     Balance Overall balance assessment: Needs assistance Sitting-balance support: Single extremity supported Sitting balance-Leahy Scale: Fair Sitting balance - Comments: close min guard for static sitting, constant verbal cues to stay seated and wait for assist to stand/transfer   Standing balance support: Single extremity supported, During functional activity Standing balance-Leahy Scale: Poor Standing balance comment: reliant on external support and therapist assist                            Cognition Arousal/Alertness: Awake/alert Behavior During Therapy: Impulsive Overall Cognitive Status: No family/caregiver present to determine baseline cognitive functioning Area of Impairment: Memory, Following commands, Safety/judgement, Problem solving, Awareness  Memory: Decreased short-term memory Following Commands: Follows one step commands with increased time Safety/Judgement: Decreased awareness of safety, Decreased awareness of  deficits Awareness: Intellectual Problem Solving: Slow processing, Decreased initiation, Difficulty sequencing, Requires verbal cues General Comments: pt impulsive, quick to move, requires constant verbal cues for safety        Exercises      General Comments General comments (skin integrity, edema, etc.): VSS on RA      Pertinent Vitals/Pain Pain Assessment Pain Assessment: No/denies pain    Home Living                          Prior Function            PT Goals (current goals can now be found in the care plan section) Acute Rehab PT Goals Patient Stated Goal: to return to his SNF PT Goal Formulation: With patient Time For Goal Achievement: 03/25/22 Potential to Achieve Goals: Good Progress towards PT goals: Progressing toward goals    Frequency    Min 2X/week      PT Plan Current plan remains appropriate    Co-evaluation              AM-PAC PT "6 Clicks" Mobility   Outcome Measure  Help needed turning from your back to your side while in a flat bed without using bedrails?: None Help needed moving from lying on your back to sitting on the side of a flat bed without using bedrails?: A Little Help needed moving to and from a bed to a chair (including a wheelchair)?: A Little Help needed standing up from a chair using your arms (e.g., wheelchair or bedside chair)?: A Lot Help needed to walk in hospital room?: Total Help needed climbing 3-5 steps with a railing? : Total 6 Click Score: 14    End of Session Equipment Utilized During Treatment: Gait belt Activity Tolerance: Patient tolerated treatment well Patient left: with call bell/phone within reach;in chair;with chair alarm set Nurse Communication: Mobility status PT Visit Diagnosis: Other abnormalities of gait and mobility (R26.89)     Time: 7564-3329 PT Time Calculation (min) (ACUTE ONLY): 30 min  Charges:  $Gait Training: 8-22 mins $Therapeutic Activity: 8-22 mins                      Lewis Shock, PT, DPT Acute Rehabilitation Services Secure chat preferred Office #: [phone]    Iona Hansen 03/13/2022, 1:12 PM

## 2022-03-20 ENCOUNTER — Encounter: Payer: Self-pay | Admitting: Orthopedic Surgery

## 2022-03-20 ENCOUNTER — Ambulatory Visit (INDEPENDENT_AMBULATORY_CARE_PROVIDER_SITE_OTHER): Payer: Medicaid Other | Admitting: Orthopedic Surgery

## 2022-03-20 DIAGNOSIS — M86462 Chronic osteomyelitis with draining sinus, left tibia and fibula: Secondary | ICD-10-CM

## 2022-03-20 NOTE — Progress Notes (Signed)
Office Visit Note   Patient: Jeremy Browning           Date of Birth: 03/19/1961           MRN: [id number] Visit Date: 03/20/2022              Requested by: Crist Fat, MD [address] [place],  [place] [address] PCP: Crist Fat, MD  Chief Complaint  Patient presents with   Left Leg - Routine Post Op    02/10/22 HDW removal left tibia      HPI: Patient is a 61 year old gentleman who is seen 4 weeks status post removal of deep retained hardware tibial plateau fracture on the left.  Assessment & Plan: Visit Diagnoses:  1. 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  Advance activities as tolerated.  No restrictions.  Follow-Up Instructions: Return if symptoms worsen or fail to improve.   Ortho Exam  Patient is alert, oriented, no adenopathy, well-dressed, normal affect, normal respiratory effort. Examination the lateral tibial plateau incision is well-healed there is no redness no cellulitis no open wounds no drainage there is no tenderness to palpation.  Imaging: No results found. No images are attached to the encounter.  Labs: Lab Results  Component Value Date   HGBA1C 5.5 03/11/2022   HGBA1C 7.1 (H) 02/26/2021   HGBA1C 5.9 (H) 12/25/2020   ESRSEDRATE 23 (H) 11/07/2021   ESRSEDRATE 118 (H) 08/11/2021   ESRSEDRATE 116 (H) 02/03/2021   CRP 18.7 (H) 08/11/2021   CRP 30.4 (H) 06/14/2021   LABURIC 7.9 02/03/2021   REPTSTATUS 03/13/2022 FINAL 03/08/2022   REPTSTATUS 03/13/2022 FINAL 03/08/2022   REPTSTATUS 03/09/2022 FINAL 03/08/2022   GRAMSTAIN NO WBC SEEN NO ORGANISMS SEEN  02/10/2022   CULT  03/08/2022    NO GROWTH 5 DAYS Performed at [facility], [address] [place] [place], [place] [address]    CULT  03/08/2022    NO GROWTH 5 DAYS Performed at [facility], [address], [place], [place] [address]    CULT (A) 03/08/2022    <10,000 COLONIES/mL INSIGNIFICANT  GROWTH Performed at [facility], [address], [place], [place] [address]    [facility] STAPHYLOCOCCUS AUREUS 11/07/2021     Lab Results  Component Value Date   ALBUMIN 2.8 (L) 03/08/2022   ALBUMIN 3.7 03/08/2022   ALBUMIN 3.4 (L) 11/06/2021    Lab Results  Component Value Date   MG 2.1 03/12/2022   MG 2.1 03/11/2022   MG 2.1 03/11/2022   Lab Results  Component Value Date   VD25OH 25.76 (L) 02/12/2021    No results found for: "PREALBUMIN"    Latest Ref Rng & Units 03/13/2022    6:56 AM 03/12/2022    5:42 AM 03/10/2022    4:39 AM  CBC EXTENDED  WBC 4.0 - 10.5 K/uL 5.5  5.3  6.7   RBC 4.22 - 5.81 MIL/uL 4.56  3.82  4.02   Hemoglobin 13.0 - 17.0 g/dL 86.1  9.9  68.3   HCT 72.9 - 52.0 % 36.9  31.0  32.2   Platelets 150 - 400 K/uL 190  164  142      There is no height or weight on file to calculate BMI.  Orders:  No orders of the defined types were placed in this encounter.  No orders of the defined types were placed in this encounter.  Procedures: No procedures performed  Clinical Data: No additional findings.  ROS:  All other systems negative, except as noted in the HPI. Review of Systems  Objective: Vital Signs: There were no vitals taken for this visit.  Specialty Comments:  No specialty comments available.  PMFS History: Patient Active Problem List   Diagnosis Date Noted   Aspiration pneumonia (HCC) 03/13/2022   Seizures (HCC) 03/08/2022   Seizure (HCC) 03/08/2022   Status epilepticus (HCC)    Hypotension due to drugs    Subacute osteomyelitis of left tibia [facility])    Hardware complicating wound infection (HCC)    Acute on chronic HFrEF (heart failure with reduced ejection fraction) (HCC)    Breakthrough seizure (HCC) 11/07/2021   NSTEMI (non-ST elevated myocardial infarction) (HCC) 11/07/2021   Stage 3a chronic kidney disease (HCC) 11/07/2021   Seizure disorder as sequela of cerebrovascular accident (HCC) 11/07/2021   Demand ischemia  (HCC)    Status post open reduction and internal fixation (ORIF) of fracture 09/07/2021   Medication monitoring encounter 08/11/2021   Draining cutaneous sinus tract 08/11/2021   Chronic osteomyelitis (HCC) 08/11/2021   Morbid obesity (HCC) 05/14/2021   OSA (obstructive sleep apnea) 05/14/2021   Complete left bundle branch block (LBBB) 05/14/2021   Chronic venous insufficiency -Left Leg > Right. 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   Essential hypertension 02/16/2021   Pressure injury of skin 02/10/2021   Oropharyngeal dysphagia    HFrEF (heart failure with reduced ejection fraction) (HCC)    h/o Cardiopulmonary arrest with successful resuscitation (HCC) 12/25/2020   Acute respiratory failure with hypoxia (HCC) 12/25/2020   AKI (acute kidney injury) (HCC) 12/25/2020   Wenckebach second degree AV block 12/25/2020   Lactic acidosis 12/25/2020   Delayed surgical wound healing 06/23/2020   Skin ulcer of knee, left, limited to breakdown of skin (HCC) 06/23/2020   BMI 40.0-44.9, adult (HCC) 12/12/2019   Left ventricular dyssynchrony 09/14/2018   Hyperlipidemia 09/13/2018   Tobacco abuse 02/07/2017   Controlled type 2 diabetes mellitus without complication, without long-term current use of insulin (HCC) 06/15/2016   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  By report no cardiac surgery   Seizures (HCC)     Severe hypoxic-ischemic encephalopathy    Sleep apnea    Stroke (HCC)    Venous stasis dermatitis of left lower extremity     Family History  Problem Relation Age of Onset   Asthma Mother    Hypertension Mother        LIkely Severe/Malignant   Kidney failure Mother        ESRD - HD   CVA Mother    Alcohol abuse Father    Depression Father    Asthma Sister    Hypertension Sister    Diabetes Mellitus II Sister    Asthma Brother    Hypertension Brother     Past Surgical History:  Procedure  Laterality Date   HARDWARE REMOVAL Left 02/10/2022   Procedure: HARDWARE REMOVAL LEFT TIBIA;  Surgeon: Nadara Mustard, MD;  Location: [facility] OR;  Service: Orthopedics;  Laterality: Left;   I & D EXTREMITY Left 02/10/2022   Procedure: DEBRIDEMENT OF LEFT TIBIA;  Surgeon: Nadara Mustard, MD;  Location: [facility] OR;  Service: Orthopedics;  Laterality: Left;   LEFT HEART CATH AND CORONARY ANGIOGRAPHY N/A 12/25/2020   Procedure: LEFT HEART CATH AND CORONARY ANGIOGRAPHY;  Surgeon: Marykay Lex, MD;  Location: [facility] INVASIVE CV LAB;  Service: Cardiovascular;  Laterality: N/A; (In setting of Acute Hypoxic and Hypercapnic Respiratory Failure with Flash Pulm Edema/Hypertensive Emergency) - Angiographically minimal CAD.  Moderately reduced LV function with EF of 35 to 45%. LV P-EDP 101/21 mmHg - 30 mmHg   LEFT HEART CATH AND CORONARY ANGIOGRAPHY  09/12/2018   [facility]): Ostial LAD ~20% otherwise normal coronary arteries. LVEDP 20 mmHg;- HTN Emergency related Type II MI>   PEG PLACEMENT N/A 01/11/2021   Procedure: PERCUTANEOUS ENDOSCOPIC GASTROSTOMY (PEG) PLACEMENT;  Surgeon: Midge Minium, MD;  Location: [facility] ENDOSCOPY;  Service: Endoscopy;  Laterality: N/A;   RIGHT HEART CATH N/A 12/25/2020   Procedure: RIGHT HEART CATH;  Surgeon: Marykay Lex, MD;  Location: [facility] INVASIVE CV LAB;  Service: Cardiovascular; R IJ: (Acute Hypoxic and Hypercapnic Respiratory Failure with Flash Pulm Edema/Hypertensive Emergency): CO-CI  6.54-2.82. Mild Secondary Pulmonary Hypertension (PCWP 30 mmHg.  PAP 47/13 mmHg with a mean PAP of 33 mmHg, RAP 22 mmHg in setting of AoP-MAP 97/59 mmHg - 71 mmHg,   TRACHEOSTOMY TUBE PLACEMENT N/A 01/12/2021   Procedure: TRACHEOSTOMY;  Surgeon: Bud Face, MD;  Location: [facility] ORS;  Service: ENT;  Laterality: N/A;   TRANSTHORACIC ECHOCARDIOGRAM  09/11/2018   [facility]: Moderately reduced LVEF ~35%. 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 Mild-mod AI, Trivial MR?TR. LVEDD 6.0 (reduced compared to 2017: EF ~40-45%, LVEDD 5.6   TRANSTHORACIC ECHOCARDIOGRAM  12/27/2020   [facility]: (In Setting of Acute Hypoxic and Hypercapnic Respiratory Failure with Flash Pulm Edema/Hypertensive Emergency)  EF 45-50%. Mildly reduced LVEF with global HK. Moderately dilated LV with Gr 1 DD - mild LA dilation. Normal RV size Fxn.  Normal Vavles   TRANSTHORACIC ECHOCARDIOGRAM  03/07/2021   [facility]: EF improved to 60-65% with Nl LV Fxn & ~ normal diastolic fxn. Normal RV size & fxn. Mild MR & AI.  Normal LA size.   Unknown     Social History   Occupational History   Not on file  Tobacco Use   Smoking status: Former    Packs/day: 0.50    Years: 7.00    Total pack years: 3.50    Types: Cigarettes    Quit date: 09/11/2018    Years since quitting: 3.5   Smokeless tobacco: Never  Vaping Use   Vaping Use: Never used  Substance and Sexual Activity   Alcohol use: Not Currently   Drug use: Not Currently   Sexual activity: Not Currently

## 2022-04-19 ENCOUNTER — Other Ambulatory Visit: Payer: Self-pay

## 2022-04-19 ENCOUNTER — Inpatient Hospital Stay
Admission: EM | Admit: 2022-04-19 | Discharge: 2022-04-21 | DRG: 101 | Disposition: A | Discharge status: Skilled Nursing Facility | Payer: Medicaid Other | Source: Skilled Nursing Facility | Attending: Hospitalist | Admitting: Hospitalist

## 2022-04-19 DIAGNOSIS — G40919 Epilepsy, unspecified, intractable, without status epilepticus: Secondary | ICD-10-CM | POA: Diagnosis not present

## 2022-04-19 DIAGNOSIS — Z8249 Family history of ischemic heart disease and other diseases of the circulatory system: Secondary | ICD-10-CM

## 2022-04-19 DIAGNOSIS — E875 Hyperkalemia: Secondary | ICD-10-CM

## 2022-04-19 DIAGNOSIS — E1122 Type 2 diabetes mellitus with diabetic chronic kidney disease: Secondary | ICD-10-CM | POA: Diagnosis present

## 2022-04-19 DIAGNOSIS — E119 Type 2 diabetes mellitus without complications: Secondary | ICD-10-CM

## 2022-04-19 DIAGNOSIS — Z87891 Personal history of nicotine dependence: Secondary | ICD-10-CM

## 2022-04-19 DIAGNOSIS — Z79899 Other long term (current) drug therapy: Secondary | ICD-10-CM

## 2022-04-19 DIAGNOSIS — N1831 Chronic kidney disease, stage 3a: Secondary | ICD-10-CM | POA: Diagnosis present

## 2022-04-19 DIAGNOSIS — I69398 Other sequelae of cerebral infarction: Secondary | ICD-10-CM

## 2022-04-19 DIAGNOSIS — Z833 Family history of diabetes mellitus: Secondary | ICD-10-CM

## 2022-04-19 DIAGNOSIS — Z823 Family history of stroke: Secondary | ICD-10-CM

## 2022-04-19 DIAGNOSIS — G40909 Epilepsy, unspecified, not intractable, without status epilepticus: Secondary | ICD-10-CM

## 2022-04-19 DIAGNOSIS — I5020 Unspecified systolic (congestive) heart failure: Secondary | ICD-10-CM | POA: Diagnosis present

## 2022-04-19 DIAGNOSIS — I69954 Hemiplegia and hemiparesis following unspecified cerebrovascular disease affecting left non-dominant side: Secondary | ICD-10-CM

## 2022-04-19 DIAGNOSIS — D696 Thrombocytopenia, unspecified: Secondary | ICD-10-CM | POA: Diagnosis present

## 2022-04-19 DIAGNOSIS — J449 Chronic obstructive pulmonary disease, unspecified: Secondary | ICD-10-CM | POA: Diagnosis present

## 2022-04-19 DIAGNOSIS — G40109 Localization-related (focal) (partial) symptomatic epilepsy and epileptic syndromes with simple partial seizures, not intractable, without status epilepticus: Principal | ICD-10-CM | POA: Diagnosis present

## 2022-04-19 DIAGNOSIS — R569 Unspecified convulsions: Principal | ICD-10-CM

## 2022-04-19 DIAGNOSIS — R001 Bradycardia, unspecified: Secondary | ICD-10-CM | POA: Diagnosis present

## 2022-04-19 DIAGNOSIS — I5022 Chronic systolic (congestive) heart failure: Secondary | ICD-10-CM | POA: Diagnosis present

## 2022-04-19 DIAGNOSIS — Z841 Family history of disorders of kidney and ureter: Secondary | ICD-10-CM

## 2022-04-19 DIAGNOSIS — Z825 Family history of asthma and other chronic lower respiratory diseases: Secondary | ICD-10-CM

## 2022-04-19 DIAGNOSIS — I130 Hypertensive heart and chronic kidney disease with heart failure and stage 1 through stage 4 chronic kidney disease, or unspecified chronic kidney disease: Secondary | ICD-10-CM | POA: Diagnosis present

## 2022-04-19 DIAGNOSIS — I10 Essential (primary) hypertension: Secondary | ICD-10-CM | POA: Diagnosis present

## 2022-04-19 DIAGNOSIS — I872 Venous insufficiency (chronic) (peripheral): Secondary | ICD-10-CM | POA: Diagnosis present

## 2022-04-19 DIAGNOSIS — Z818 Family history of other mental and behavioral disorders: Secondary | ICD-10-CM

## 2022-04-19 DIAGNOSIS — I69354 Hemiplegia and hemiparesis following cerebral infarction affecting left non-dominant side: Secondary | ICD-10-CM

## 2022-04-19 DIAGNOSIS — Z7982 Long term (current) use of aspirin: Secondary | ICD-10-CM

## 2022-04-19 LAB — BASIC METABOLIC PANEL
Anion gap: 9 (ref 5–15)
BUN: 22 mg/dL (ref 8–23)
CO2: 23 mmol/L (ref 22–32)
Calcium: 9 mg/dL (ref 8.9–10.3)
Chloride: 107 mmol/L (ref 98–111)
Creatinine, Ser: 1.56 mg/dL — ABNORMAL HIGH (ref 0.61–1.24)
GFR, Estimated: 50 mL/min — ABNORMAL LOW (ref >60)
Glucose, Bld: 96 mg/dL (ref 70–99)
Potassium: 5.3 mmol/L — ABNORMAL HIGH (ref 3.5–5.1)
Sodium: 139 mmol/L (ref 135–145)

## 2022-04-19 LAB — CBC
HCT: 44.3 % (ref 39.0–52.0)
Hemoglobin: 13.8 g/dL (ref 13.0–17.0)
MCH: 25.2 pg — ABNORMAL LOW (ref 26.0–34.0)
MCHC: 31.2 g/dL (ref 30.0–36.0)
MCV: 81 fL (ref 80.0–100.0)
Platelets: 110 10*3/uL — ABNORMAL LOW (ref 150–400)
RBC: 5.47 MIL/uL (ref 4.22–5.81)
RDW: 14.9 % (ref 11.5–15.5)
WBC: 6.6 10*3/uL (ref 4.0–10.5)
nRBC: 0 % (ref 0.0–0.2)

## 2022-04-19 LAB — VALPROIC ACID LEVEL: Valproic Acid Lvl: 66 ug/mL (ref 50.0–100.0)

## 2022-04-19 MED ORDER — LEVETIRACETAM 750 MG PO TABS
750.0000 mg | ORAL_TABLET | Freq: Once | ORAL | Status: AC
  Administered 2022-04-19: 750 mg via ORAL
  Filled 2022-04-19: qty 1

## 2022-04-19 MED ORDER — PATIROMER SORBITEX CALCIUM 8.4 G PO PACK
16.8000 g | PACK | Freq: Once | ORAL | Status: AC
Start: 2022-04-19 — End: 2022-04-19
  Administered 2022-04-19: 16.8 g via ORAL
  Filled 2022-04-19: qty 2

## 2022-04-19 MED ORDER — DIVALPROEX SODIUM 500 MG PO DR TAB
500.0000 mg | DELAYED_RELEASE_TABLET | ORAL | Status: AC
Start: 2022-04-19 — End: 2022-04-19
  Administered 2022-04-19: 500 mg via ORAL
  Filled 2022-04-19: qty 1

## 2022-04-19 NOTE — Assessment & Plan Note (Signed)
Renal function at baseline 

## 2022-04-19 NOTE — ED Provider Triage Note (Signed)
Emergency Medicine Provider Triage Evaluation Note  Jeremy Browning , a 61 y.o. male  was evaluated in triage. Per report, seizure activity and given ativan. On depakote without missed doses.  Physical Exam  Ht 5\' 7"  (1.702 m)   Wt 108 kg   BMI 37.29 kg/m  Gen:   Awake but falls asleep during conversation, no distress   Resp:  Normal effort  MSK:   Moves extremities without difficulty  Other:  Will follow commands appropriately when awakened.  Medical Decision Making  Medically screening exam initiated at 6:45 PM.  Appropriate orders placed.  Jeremy Browning was informed that the remainder of the evaluation will be completed by another provider, this initial triage assessment does not replace that evaluation, and the importance of remaining in the ED until their evaluation is complete.     [name], FNP 04/19/22 1926

## 2022-04-19 NOTE — ED Notes (Signed)
PT brought to ED rm 15 at this time, this RN now assuming care.

## 2022-04-19 NOTE — H&P (Signed)
History and Physical    Patient: Jeremy Browning [id number] DOB: 1961/08/06 DOA: 04/19/2022 DOS: the patient was seen and examined on 04/19/2022 PCP: Townsend Roger, MD  Patient coming from: SNF  Chief Complaint:  Chief Complaint  Patient presents with   Seizures    HPI: Jeremy Browning is a 61 y.o. 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  He received Ativan 1 mg IM at the facility and has had no further seizures. ED course and data review: BP 151/67 with otherwise normal vitals.  Labs with creatinine at baseline at 1.56, platelets 110,000, valproic acid level normal at 66, Keppra level pending.  EKG, personally viewed and interpreted showing sinus bradycardia at 55 with no acute ST-T wave changes Patient was treated with his night dose of Keppra 750 and Depakote 500.  Observation requested given history of breakthrough seizures with complications  Review of Systems: As mentioned in the history of present illness. All other systems reviewed and are negative.  Past Medical History:  Diagnosis Date   CHF (congestive heart failure) (HCC)    Diabetes mellitus type II, non insulin dependent ([place])    Essential hypertension [name]   h/o Proteus pneumonia ([place]) 02/17/2021   Heart failure ([place])    Unknown details.  By report no cardiac surgery   Seizures ([place])    Severe hypoxic-ischemic encephalopathy    Sleep apnea    Stroke [facility])    Venous stasis dermatitis of left lower extremity    Past Surgical History:  Procedure Laterality Date   HARDWARE REMOVAL Left  02/10/2022   Procedure: HARDWARE REMOVAL LEFT TIBIA;  Surgeon: Newt Minion, MD;  Location: [place];  Service: Orthopedics;  Laterality: Left;   I & D EXTREMITY Left 02/10/2022   Procedure: DEBRIDEMENT OF LEFT TIBIA;  Surgeon: Newt Minion, MD;  Location: [place];  Service: Orthopedics;  Laterality: Left;   LEFT HEART CATH AND CORONARY ANGIOGRAPHY N/A 12/25/2020   Procedure: LEFT HEART CATH AND CORONARY ANGIOGRAPHY;  Surgeon: Leonie Man, MD;  Location: [place] [facility];  Service: Cardiovascular;  Laterality: N/A; (In setting of Acute Hypoxic and Hypercapnic Respiratory Failure with Flash Pulm Edema/Hypertensive Emergency) - Angiographically minimal CAD.  Moderately reduced LV function with EF of 35 to 45%. LV P-EDP 101/21 mmHg - 30 mmHg   LEFT HEART CATH AND CORONARY ANGIOGRAPHY  09/12/2018   [facility]): Ostial LAD ~20% otherwise normal coronary arteries. LVEDP 20 mmHg;- HTN Emergency related Type II MI>   PEG PLACEMENT N/A 01/11/2021   Procedure: PERCUTANEOUS ENDOSCOPIC GASTROSTOMY (PEG) PLACEMENT;  Surgeon: Lucilla Lame, MD;  Location: [facility];  Service: Endoscopy;  Laterality: N/A;   RIGHT HEART CATH N/A 12/25/2020   Procedure: RIGHT HEART CATH;  Surgeon: Leonie Man, MD;  Location: [place] [facility];  Service: Cardiovascular; R IJ: (Acute Hypoxic and Hypercapnic Respiratory Failure with Flash Pulm Edema/Hypertensive Emergency): CO-CI 6.54-2.82. Mild Secondary Pulmonary Hypertension (PCWP 30 mmHg.  PAP 47/13 mmHg with a mean PAP of 33 mmHg, RAP 22 mmHg in setting of AoP-MAP 97/59 mmHg - 71 mmHg,   TRACHEOSTOMY TUBE PLACEMENT N/A  01/12/2021   Procedure: TRACHEOSTOMY;  Surgeon: Carloyn Manner, MD;  Location: [facility] ORS;  Service: ENT;  Laterality: N/A;   TRANSTHORACIC ECHOCARDIOGRAM  09/11/2018   [facility]: Moderately reduced LVEF ~35%. 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 Mild-mod AI, Trivial MR?TR. LVEDD 6.0 (reduced compared to 2017: EF ~40-45%, LVEDD 5.6    TRANSTHORACIC ECHOCARDIOGRAM  12/27/2020   [facility]: (In Setting of Acute Hypoxic and Hypercapnic Respiratory Failure with Flash Pulm Edema/Hypertensive Emergency)  EF 45-50%. Mildly reduced LVEF with global HK. Moderately dilated LV with Gr 1 DD - mild LA dilation. Normal RV size Fxn.  Normal [place]   TRANSTHORACIC ECHOCARDIOGRAM  03/07/2021   [facility]: EF improved to 60-65% with Nl LV Fxn & ~ normal diastolic fxn. Normal RV size & fxn. Mild MR & AI.  Normal LA size.   Unknown     Social History:  reports that he quit smoking about 3 years ago. His smoking use included cigarettes. He has a 3.50 pack-year smoking history. He has never used smokeless tobacco. He reports that he does not currently use alcohol. He reports that he does not currently use drugs.  No Known Allergies  Family History  Problem Relation Age of Onset   Asthma Mother    Hypertension Mother        LIkely Severe/Malignant   Kidney failure Mother        ESRD - HD   CVA Mother    Alcohol abuse Father    Depression Father    Asthma Sister    Hypertension Sister    Diabetes Mellitus II Sister    Asthma Brother    Hypertension Brother     Prior to Admission medications   Medication Sig Start Date End Date Taking? Authorizing Provider  aspirin EC 81 MG tablet Take 81 mg by mouth daily.    [provider]  atorvastatin (LIPITOR) 80 MG tablet Take 80 mg by mouth daily.    [provider]  carvedilol (COREG) 6.25 MG tablet Take 6.25 mg by mouth 2 (two) times daily.    [provider]  diclofenac Sodium (VOLTAREN) 1 % GEL Apply 2 g topically 4 (four) times daily.    [provider]  divalproex (DEPAKOTE) 500 MG DR tablet Take 1 tablet (500 mg total) by mouth 2 (two) times daily. 03/13/22   Patrecia Pour, MD  furosemide (LASIX) 20 MG tablet Take 20 mg by mouth daily.    [provider]  levETIRAcetam (KEPPRA) 750 MG tablet Take 2 tablets (1,500 mg total) by mouth 2 (two) times daily.  03/13/22   Patrecia Pour, MD  losartan (COZAAR) 25 MG tablet Take 25 mg by mouth daily.    [provider]  Potassium Bicarb-Citric Acid (EFFER-K) 10 MEQ TBEF Take 10 mEq by mouth daily.    [provider]  sertraline (ZOLOFT) 25 MG tablet Take 25 mg by mouth daily.    [provider]  tamsulosin (FLOMAX) 0.4 MG CAPS capsule Take 0.4 mg by mouth daily.    [provider]    Physical Exam: Vitals:   04/19/22 1847 04/19/22 2117 04/19/22 2130 04/19/22 2200  BP: (!) 151/67  (!) 175/57 (!) 156/68  Pulse: (!) 59 66 64   Resp: 15 13 16 12   Temp: 97.8 F (36.6 C)     TempSrc: Oral     SpO2: 100% 96% 96%   Weight:      Height:  Physical Exam Vitals and nursing note reviewed.  Constitutional:      General: He is sleeping. He is not in acute distress.    Comments: Somnolent but arousable  HENT:     Head: Normocephalic and atraumatic.  Cardiovascular:     Rate and Rhythm: Normal rate and regular rhythm.     Heart sounds: Normal heart sounds.  Pulmonary:     Effort: Pulmonary effort is normal.     Breath sounds: Normal breath sounds.  Abdominal:     Palpations: Abdomen is soft.     Tenderness: There is no abdominal tenderness.  Neurological:     General: No focal deficit present.     Labs on Admission: I have personally reviewed following labs and imaging studies  CBC: Recent Labs  Lab 04/19/22 1924  WBC 6.6  HGB 13.8  HCT 44.3  MCV 81.0  PLT 110*   Basic Metabolic Panel: Recent Labs  Lab 04/19/22 1924  NA 139  K 5.3*  CL 107  CO2 23  GLUCOSE 96  BUN 22  CREATININE 1.56*  CALCIUM 9.0   GFR: Estimated Creatinine Clearance: 57.6 mL/min (A) (by C-G formula based on SCr of 1.56 mg/dL (H)). Liver Function Tests: No results for input(s): "AST", "ALT", "ALKPHOS", "BILITOT", "PROT", "ALBUMIN" in the last 168 hours. No results for input(s): "LIPASE", "AMYLASE" in the last 168 hours. No results for input(s): "AMMONIA" in the last  168 hours. Coagulation Profile: No results for input(s): "INR", "PROTIME" in the last 168 hours. Cardiac Enzymes: No results for input(s): "CKTOTAL", "CKMB", "CKMBINDEX", "TROPONINI" in the last 168 hours. BNP (last 3 results) No results for input(s): "PROBNP" in the last 8760 hours. HbA1C: No results for input(s): "HGBA1C" in the last 72 hours. CBG: No results for input(s): "GLUCAP" in the last 168 hours. Lipid Profile: No results for input(s): "CHOL", "HDL", "LDLCALC", "TRIG", "CHOLHDL", "LDLDIRECT" in the last 72 hours. Thyroid Function Tests: No results for input(s): "TSH", "T4TOTAL", "FREET4", "T3FREE", "THYROIDAB" in the last 72 hours. Anemia Panel: No results for input(s): "VITAMINB12", "FOLATE", "FERRITIN", "TIBC", "IRON", "RETICCTPCT" in the last 72 hours. Urine analysis:    Component Value Date/Time   COLORURINE YELLOW (A) 03/08/2022 0117   APPEARANCEUR CLEAR (A) 03/08/2022 0117   LABSPEC 1.011 03/08/2022 0117   PHURINE 6.0 03/08/2022 0117   GLUCOSEU NEGATIVE 03/08/2022 0117   HGBUR NEGATIVE 03/08/2022 0117   BILIRUBINUR NEGATIVE 03/08/2022 0117   KETONESUR NEGATIVE 03/08/2022 0117   PROTEINUR 100 (A) 03/08/2022 0117   NITRITE NEGATIVE 03/08/2022 0117   LEUKOCYTESUR NEGATIVE 03/08/2022 0117    Radiological Exams on Admission: No results found.   Data Reviewed: Relevant notes from primary care and specialist visits, past discharge summaries as available in EHR, including Care Everywhere. Prior diagnostic testing as pertinent to current admission diagnoses Updated medications and problem lists for reconciliation ED course, including vitals, labs, imaging, treatment and response to treatment Triage notes, nursing and pharmacy notes and ED provider's notes Notable results as noted in HPI   Assessment and Plan: * Breakthrough seizure (HCC) Seizure disorder as sequela of cerebrovascular accident (HCC) Continue Keppra 1000 mg IV twice daily Ativan as needed  breakthrough Neurologic checks Seizure, fall and aspiration precautions Continuous EEG 02/2022:This EEG was obtained while sedated on versed and fentanyl and was abnormal due to moderate diffuse slowing and superimposed focal slowing over the right hemisphere in the region of his remote infarct. 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 tach arrest with cardiogenic shock, now 50-55 by July 2023 Continue carvedilol and furosemide         DVT prophylaxis: Lovenox  Consults: Neurology, Dr. Selina Cooley  Advance Care Planning:   Code Status: Prior   Family Communication: none  Disposition Plan: Back to previous home environment  Severity of Illness: The appropriate patient status for this patient is OBSERVATION. Observation status is judged to be reasonable and necessary in order to provide the required intensity of service to ensure the patient's safety. The patient's presenting symptoms, physical exam findings, and initial radiographic and laboratory data in the context of their medical condition is felt to place them at decreased risk for further clinical deterioration. Furthermore, it is anticipated that the patient will be medically  stable for discharge from the hospital within 2 midnights of admission.   Author: Andris Baumann, MD 04/19/2022 10:48 PM  For on call review [email or web address].

## 2022-04-19 NOTE — Assessment & Plan Note (Signed)
DuoNebs as needed 

## 2022-04-19 NOTE — Assessment & Plan Note (Signed)
Sliding scale insulin coverage for now. 

## 2022-04-19 NOTE — Assessment & Plan Note (Signed)
Platelets of 110,000 Continue to monitor

## 2022-04-19 NOTE — Assessment & Plan Note (Signed)
Continue losartan and carvedilol 

## 2022-04-19 NOTE — Assessment & Plan Note (Addendum)
Increased assistance with transfers Continue aspirin and atorvastatin

## 2022-04-19 NOTE — ED Notes (Signed)
First nurse-pt brought in via ems from [facility] with seizures.  Ativan given 1mg  IM at facility.  Hx cva,contracture of left arm.  Pt in recliner in triage.  Pt alert, reports  a headache.

## 2022-04-19 NOTE — ED Notes (Signed)
ED Provider at bedside. 

## 2022-04-19 NOTE — Assessment & Plan Note (Signed)
Veltassa x1 dose 

## 2022-04-19 NOTE — ED Triage Notes (Signed)
Pt states he had a seizure today and was sent here by the facility.

## 2022-04-19 NOTE — ED Notes (Signed)
Jeremy Browning (brother) called on pt's behalf to update him.

## 2022-04-19 NOTE — Assessment & Plan Note (Addendum)
EF 45 to 50% in May 2022 in the setting of out of hospital V. tach arrest with cardiogenic shock, now 50-55 by July 2023 Plan: Continue furosemide  --hold home coreg 2/2 intermittent bradycardia

## 2022-04-19 NOTE — Assessment & Plan Note (Addendum)
Seizure disorder as sequela of cerebrovascular accident (HCC) Continue Keppra 1000 mg IV twice daily Ativan as needed breakthrough Neurologic checks Seizure, fall and aspiration precautions Continuous EEG 02/2022:This EEG was obtained while sedated on versed and fentanyl and was abnormal due to moderate diffuse slowing and superimposed focal slowing over the right hemisphere in the region of his remote infarct. Epileptiform abnormalities were not seen during this recording Neurology consult

## 2022-04-20 ENCOUNTER — Observation Stay: Payer: Medicaid Other

## 2022-04-20 DIAGNOSIS — Z87891 Personal history of nicotine dependence: Secondary | ICD-10-CM | POA: Diagnosis not present

## 2022-04-20 DIAGNOSIS — Z841 Family history of disorders of kidney and ureter: Secondary | ICD-10-CM | POA: Diagnosis not present

## 2022-04-20 DIAGNOSIS — D696 Thrombocytopenia, unspecified: Secondary | ICD-10-CM | POA: Diagnosis present

## 2022-04-20 DIAGNOSIS — I5022 Chronic systolic (congestive) heart failure: Secondary | ICD-10-CM | POA: Diagnosis present

## 2022-04-20 DIAGNOSIS — R001 Bradycardia, unspecified: Secondary | ICD-10-CM | POA: Diagnosis present

## 2022-04-20 DIAGNOSIS — I872 Venous insufficiency (chronic) (peripheral): Secondary | ICD-10-CM | POA: Diagnosis present

## 2022-04-20 DIAGNOSIS — Z79899 Other long term (current) drug therapy: Secondary | ICD-10-CM | POA: Diagnosis not present

## 2022-04-20 DIAGNOSIS — Z8249 Family history of ischemic heart disease and other diseases of the circulatory system: Secondary | ICD-10-CM | POA: Diagnosis not present

## 2022-04-20 DIAGNOSIS — Z833 Family history of diabetes mellitus: Secondary | ICD-10-CM | POA: Diagnosis not present

## 2022-04-20 DIAGNOSIS — R569 Unspecified convulsions: Secondary | ICD-10-CM | POA: Diagnosis present

## 2022-04-20 DIAGNOSIS — J449 Chronic obstructive pulmonary disease, unspecified: Secondary | ICD-10-CM | POA: Diagnosis present

## 2022-04-20 DIAGNOSIS — I130 Hypertensive heart and chronic kidney disease with heart failure and stage 1 through stage 4 chronic kidney disease, or unspecified chronic kidney disease: Secondary | ICD-10-CM | POA: Diagnosis present

## 2022-04-20 DIAGNOSIS — I69354 Hemiplegia and hemiparesis following cerebral infarction affecting left non-dominant side: Secondary | ICD-10-CM | POA: Diagnosis not present

## 2022-04-20 DIAGNOSIS — Z818 Family history of other mental and behavioral disorders: Secondary | ICD-10-CM | POA: Diagnosis not present

## 2022-04-20 DIAGNOSIS — G40919 Epilepsy, unspecified, intractable, without status epilepticus: Secondary | ICD-10-CM

## 2022-04-20 DIAGNOSIS — N1831 Chronic kidney disease, stage 3a: Secondary | ICD-10-CM | POA: Diagnosis present

## 2022-04-20 DIAGNOSIS — Z823 Family history of stroke: Secondary | ICD-10-CM | POA: Diagnosis not present

## 2022-04-20 DIAGNOSIS — I69398 Other sequelae of cerebral infarction: Secondary | ICD-10-CM | POA: Diagnosis not present

## 2022-04-20 DIAGNOSIS — Z7982 Long term (current) use of aspirin: Secondary | ICD-10-CM | POA: Diagnosis not present

## 2022-04-20 DIAGNOSIS — E1122 Type 2 diabetes mellitus with diabetic chronic kidney disease: Secondary | ICD-10-CM | POA: Diagnosis present

## 2022-04-20 DIAGNOSIS — Z825 Family history of asthma and other chronic lower respiratory diseases: Secondary | ICD-10-CM | POA: Diagnosis not present

## 2022-04-20 DIAGNOSIS — G40109 Localization-related (focal) (partial) symptomatic epilepsy and epileptic syndromes with simple partial seizures, not intractable, without status epilepticus: Secondary | ICD-10-CM | POA: Diagnosis present

## 2022-04-20 DIAGNOSIS — E875 Hyperkalemia: Secondary | ICD-10-CM | POA: Diagnosis present

## 2022-04-20 LAB — HEPATIC FUNCTION PANEL
ALT: 18 U/L (ref 0–44)
AST: 24 U/L (ref 15–41)
Albumin: 3.6 g/dL (ref 3.5–5.0)
Alkaline Phosphatase: 64 U/L (ref 38–126)
Bilirubin, Direct: <0.1 mg/dL (ref 0.0–0.2)
Total Bilirubin: 0.8 mg/dL (ref 0.3–1.2)
Total Protein: 7.5 g/dL (ref 6.5–8.1)

## 2022-04-20 LAB — AMMONIA: Ammonia: 18 umol/L (ref 9–35)

## 2022-04-20 MED ORDER — ENOXAPARIN SODIUM 60 MG/0.6ML IJ SOSY
0.5000 mg/kg | PREFILLED_SYRINGE | INTRAMUSCULAR | Status: DC
  Administered 2022-04-20: 55 mg via SUBCUTANEOUS
  Filled 2022-04-20: qty 0.6

## 2022-04-20 MED ORDER — DIVALPROEX SODIUM 500 MG PO DR TAB
750.0000 mg | DELAYED_RELEASE_TABLET | Freq: Two times a day (BID) | ORAL | Status: DC
  Administered 2022-04-20 – 2022-04-21 (×3): 750 mg via ORAL
  Filled 2022-04-20 (×4): qty 1

## 2022-04-20 MED ORDER — ASPIRIN 81 MG PO TBEC
81.0000 mg | DELAYED_RELEASE_TABLET | Freq: Every day | ORAL | Status: DC
  Administered 2022-04-21: 81 mg via ORAL
  Filled 2022-04-20: qty 1

## 2022-04-20 MED ORDER — FUROSEMIDE 20 MG PO TABS
20.0000 mg | ORAL_TABLET | Freq: Every day | ORAL | Status: DC
  Administered 2022-04-21: 20 mg via ORAL
  Filled 2022-04-20: qty 1

## 2022-04-20 MED ORDER — LOSARTAN POTASSIUM 25 MG PO TABS
25.0000 mg | ORAL_TABLET | Freq: Every day | ORAL | Status: DC
  Administered 2022-04-20 – 2022-04-21 (×2): 25 mg via ORAL
  Filled 2022-04-20 (×2): qty 1

## 2022-04-20 MED ORDER — LEVETIRACETAM 500 MG PO TABS
1500.0000 mg | ORAL_TABLET | Freq: Two times a day (BID) | ORAL | Status: DC
  Administered 2022-04-20 – 2022-04-21 (×3): 1500 mg via ORAL
  Filled 2022-04-20 (×3): qty 3

## 2022-04-20 MED ORDER — SERTRALINE HCL 50 MG PO TABS
25.0000 mg | ORAL_TABLET | Freq: Every day | ORAL | Status: DC
  Administered 2022-04-20 – 2022-04-21 (×2): 25 mg via ORAL
  Filled 2022-04-20 (×2): qty 1

## 2022-04-20 MED ORDER — TAMSULOSIN HCL 0.4 MG PO CAPS
0.4000 mg | ORAL_CAPSULE | Freq: Every day | ORAL | Status: DC
  Administered 2022-04-20 – 2022-04-21 (×2): 0.4 mg via ORAL
  Filled 2022-04-20 (×2): qty 1

## 2022-04-20 MED ORDER — LORAZEPAM 2 MG/ML IJ SOLN: 2.0000 mg | INTRAMUSCULAR | Status: DC | PRN

## 2022-04-20 NOTE — Progress Notes (Signed)
  PROGRESS NOTE    Jeremy Browning  UUV:[id number] DOB: [birth year] DOA: 04/19/2022 PCP: Crist Fat, MD  125A/125A-AA  LOS: 0 days   Brief hospital course: No notes on file  Assessment & Plan: Jeremy Browning is a 61 y.o. 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  He received Ativan 1 mg IM at the facility and has had no further seizures.   * Breakthrough seizure (HCC) Seizure disorder as sequela of cerebrovascular accident (HCC) --head CT - Continue home keppra 1500mg  bid --increase depakote to 750mg  DR bid, per neuro rec - Patient should follow up with established outpatient neurologist Dr. and have VPA levels checked in 1-2 wks as outpatient.    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 tach arrest with cardiogenic shock, now 50-55  by July 2023 Plan: Continue furosemide  --hold home coreg 2/2 intermittent bradycardia    DVT prophylaxis: Lovenox SQ Code Status: Full code  Family Communication:  Level of care: Med-Surg Dispo:   The patient is from: SNF Anticipated d/c is to: SNF Anticipated d/c date is: tomorrow   Subjective and Interval History:  Pt was back to baseline this morning, no more seizure activity since presentation.   Objective: Vitals:   04/20/22 1000 04/20/22 1021 04/20/22 1510 04/20/22 1613  BP: (!) 172/48  (!) 143/67 (!) 163/58  Pulse: (!) 42  (!) 46 91  Resp: 15   18  Temp:  (!) 97.4 F (36.3 C)  98.5 F (36.9 C)  TempSrc:  Oral  Oral  SpO2: 97%  100% 99%  Weight:      Height:       No intake or output data in the 24 hours ending 04/20/22 1852 Filed Weights   04/19/22 1838  Weight: 108 kg    Examination:   Constitutional: NAD, AAOx3 HEENT: conjunctivae and lids normal, EOMI CV: No cyanosis.   RESP: normal respiratory effort, on RA Neuro: II - XII grossly intact.   Psych: Normal mood and affect.     Data Reviewed: I have personally reviewed labs and imaging studies  Time spent: 50 minutes  04/22/22, MD [facility] If 7PM-7AM, please contact night-coverage 04/20/2022, 6:52 PM

## 2022-04-20 NOTE — ED Notes (Signed)
Report given to Malinda RN.  

## 2022-04-20 NOTE — Plan of Care (Signed)

## 2022-04-20 NOTE — ED Provider Notes (Signed)
[facility] Provider Note    Event Date/Time   First MD Initiated Contact with Patient 04/19/22 2205     (approximate)   History   Seizures   HPI  Jeremy Browning is a 61 y.o. male history of previous stroke and chronic left-sided weakness.  He was admitted and spent time in the ICU and intubated about a month ago.  Today he reports that he had an episode that lasted several minutes of shaking in his left arm and left leg "you think I was Elvis dancing" as he describes it.  He did not lose consciousness.  No chest pain or shortness of breath no headache.  He has been taking his seizure medications but is not certain if he took them this evening  Review of medication administration record advises he has not had his evening antiepileptics but has been treated this morning      Physical Exam   Triage Vital Signs: ED Triage Vitals  Enc Vitals Group     BP 04/19/22 1847 (!) 151/67     Pulse Rate 04/19/22 1847 (!) 59     Resp 04/19/22 1847 15     Temp 04/19/22 1847 97.8 F (36.6 C)     Temp Source 04/19/22 1847 Oral     SpO2 04/19/22 1847 100 %     Weight 04/19/22 1838 238 lb 1.6 oz (108 kg)     Height 04/19/22 1838 5\' 7"  (1.702 m)     Head Circumference --      Peak Flow --      Pain Score 04/19/22 1838 0     Pain Loc --      Pain Edu? --      Excl. in GC? --     Most recent vital signs: Vitals:   04/19/22 2330 04/20/22 0033  BP: (!) 155/51 (!) 170/48  Pulse:  (!) 50  Resp: 19 16  Temp:  99.1 F (37.3 C)  SpO2: 98% 99%     General: Awake, no distress.  CV:  Good peripheral perfusion.  Resp:  Normal effort.  Clear bilateral Abd:  No distention.  Other:  Weakness with some strength in the left upper and left lower extremity, better strength in the left lower compared to the left upper.  Good strength without pronator drift noted in the right upper extremity and right lower extremity.  Patient advised that he feels normal no obvious  deficits.  His speech is discernible and relatively clear and he reports this to be his normal speech as well.  He is well oriented.  No tremulousness no ongoing seizure-like activity.  He is alert and well oriented at this time   ED Results / Procedures / Treatments   Labs (all labs ordered are listed, but only abnormal results are displayed) Labs Reviewed  BASIC METABOLIC PANEL - Abnormal; Notable for the following components:      Result Value   Potassium 5.3 (*)    Creatinine, Ser 1.56 (*)    GFR, Estimated 50 (*)    All other components within normal limits  CBC - Abnormal; Notable for the following components:   MCH 25.2 (*)    Platelets 110 (*)    All other components within normal limits  VALPROIC ACID LEVEL  CBG MONITORING, ED    RADIOLOGY  No report of injury or trauma.  Known history of epilepsy and seizure disorder.  I do not see indication for emergent CT imaging at  this time.   PROCEDURES:  Critical Care performed: No  Procedures   MEDICATIONS ORDERED IN ED: Medications  levETIRAcetam (KEPPRA) tablet 750 mg (750 mg Oral Given 04/19/22 2247)  divalproex (DEPAKOTE) DR tablet 500 mg (500 mg Oral Given 04/19/22 2247)  patiromer Lelon Perla) packet 16.8 g (16.8 g Oral Given 04/19/22 2356)     IMPRESSION / MDM / ASSESSMENT AND PLAN / ED COURSE  I reviewed the triage vital signs and the nursing notes.                              Differential diagnosis includes, but is not limited to, recurrent seizures, focal seizure, electrolyte abnormality, medication noncompliance etc.  No acute cardiopulmonary symptoms.  Denies chest pain.  No headache.  No new focal neurologic abnormalities.  Patient's presentation is most consistent with acute complicated illness / injury requiring diagnostic workup.  The patient is on the cardiac monitor to evaluate for evidence of arrhythmia and/or significant heart rate changes.  Labs with normal CBC, mild renal insufficiency with very  slight hypokalemia denoted.  Valproic acid level therapeutic    Given the patient's recent and complicated admission for seizures with respiratory failure, I think it is prudent to observe the patient overnight for evidence of recurrent seizure-like activity.  His seizure tonight evidently was abated after 2 doses of rescue medication.  Appears to be at his mental baseline with no ongoing seizure-like activity, and has been medicated with his antiepileptics for the evening.  I consulted with our hospitalist Dr. Para March, who has advised plan for admission and further observation due to history of seizure-like event today as well as complicated seizure history.  FINAL CLINICAL IMPRESSION(S) / ED DIAGNOSES   Final diagnoses:  Focal seizure (HCC)     Rx / DC Orders   ED Discharge Orders     None        Note:  This document was prepared using Dragon voice recognition software and may include unintentional dictation errors.   Sharyn Creamer, MD 04/20/22 [phone]

## 2022-04-20 NOTE — Plan of Care (Signed)
Neurology plan of care  I was contacted by Dr. Fran Lowes about this 61 yo patient very well known to our service with hx right MCA stroke with residual left-sided hemiparesis and contractures, history of seizures post-stroke, prior history of V. tach arrest in 2022 with anoxic brain injury and stroke at that point, heart failure, CKD stage III admitted after single breakthrough seizure. His last presentation for breakthrough seizure was last month. Reports compliance with outpatient regimen of keppra 1500mg  q 12 hrs and depakote DR 500mg  bid. VPA level in ED was 66. Per chart review, patient has returned to mental status and neurologic baseline. If this is correct, neurology recommendations are as follows:  - Head CT wo contrast ordered (if acute findings, call neuro) - LFTs and ammonia ordered - If LFTs and ammonia WNL, increase depakote to 750mg  DR bid - Continue keppra 1500mg  bid - Counsel patient no driving until at least 6 mos after last seizure - Patient should follow up with established outpatient neurologist Dr. and have VPA levels checked in 1-2 wks as outpatient.  No indication for formal neurology consult or inpatient EEG (we know him well, last seen by neurology inpatient 30 days ago for same) unless he has further activity concerning for seizures. Please contact me with any further questions.   , MD [facility] [phone]  If 7pm- 7am, please page neurology on call as listed in [facility]ION.

## 2022-04-21 DIAGNOSIS — G40919 Epilepsy, unspecified, intractable, without status epilepticus: Secondary | ICD-10-CM | POA: Diagnosis not present

## 2022-04-21 LAB — CBC
HCT: 39.4 % (ref 39.0–52.0)
Hemoglobin: 12.5 g/dL — ABNORMAL LOW (ref 13.0–17.0)
MCH: 25.5 pg — ABNORMAL LOW (ref 26.0–34.0)
MCHC: 31.7 g/dL (ref 30.0–36.0)
MCV: 80.4 fL (ref 80.0–100.0)
Platelets: UNDETERMINED 10*3/uL (ref 150–400)
RBC: 4.9 MIL/uL (ref 4.22–5.81)
RDW: 14.8 % (ref 11.5–15.5)
WBC: 4.4 10*3/uL (ref 4.0–10.5)
nRBC: 0 % (ref 0.0–0.2)

## 2022-04-21 LAB — BASIC METABOLIC PANEL
Anion gap: 6 (ref 5–15)
BUN: 18 mg/dL (ref 8–23)
CO2: 22 mmol/L (ref 22–32)
Calcium: 8.4 mg/dL — ABNORMAL LOW (ref 8.9–10.3)
Chloride: 109 mmol/L (ref 98–111)
Creatinine, Ser: 1.38 mg/dL — ABNORMAL HIGH (ref 0.61–1.24)
GFR, Estimated: 58 mL/min — ABNORMAL LOW (ref >60)
Glucose, Bld: 77 mg/dL (ref 70–99)
Potassium: 4.4 mmol/L (ref 3.5–5.1)
Sodium: 137 mmol/L (ref 135–145)

## 2022-04-21 LAB — MAGNESIUM: Magnesium: 2 mg/dL (ref 1.7–2.4)

## 2022-04-21 MED ORDER — DIVALPROEX SODIUM 250 MG PO DR TAB: 750.0000 mg | DELAYED_RELEASE_TABLET | Freq: Two times a day (BID) | ORAL | Status: DC

## 2022-04-21 MED ORDER — CARVEDILOL 6.25 MG PO TABS: ORAL_TABLET | ORAL | Status: DC

## 2022-04-21 NOTE — Plan of Care (Signed)
  Problem: Education: Goal: Knowledge of General Education information will improve Description Including pain rating scale, medication(s)/side effects and non-pharmacologic comfort measures Outcome: Adequate for Discharge   Problem: Health Behavior/Discharge Planning: Goal: Ability to manage health-related needs will improve Outcome: Adequate for Discharge   Problem: Clinical Measurements: Goal: Ability to maintain clinical measurements within normal limits will improve Outcome: Adequate for Discharge Goal: Will remain free from infection Outcome: Adequate for Discharge Goal: Diagnostic test results will improve Outcome: Adequate for Discharge Goal: Respiratory complications will improve Outcome: Adequate for Discharge Goal: Cardiovascular complication will be avoided Outcome: Adequate for Discharge   Problem: Activity: Goal: Risk for activity intolerance will decrease Outcome: Adequate for Discharge   Problem: Nutrition: Goal: Adequate nutrition will be maintained Outcome: Adequate for Discharge   Problem: Coping: Goal: Level of anxiety will decrease Outcome: Adequate for Discharge   Problem: Elimination: Goal: Will not experience complications related to bowel motility Outcome: Adequate for Discharge Goal: Will not experience complications related to urinary retention Outcome: Adequate for Discharge   Problem: Pain Managment: Goal: General experience of comfort will improve Outcome: Adequate for Discharge   Problem: Safety: Goal: Ability to remain free from injury will improve Outcome: Adequate for Discharge   Problem: Skin Integrity: Goal: Risk for impaired skin integrity will decrease Outcome: Adequate for Discharge   Problem: Education: Goal: Expressions of having a comfortable level of knowledge regarding the disease process will increase Outcome: Adequate for Discharge   Problem: Coping: Goal: Ability to adjust to condition or change in health will  improve Outcome: Adequate for Discharge Goal: Ability to identify appropriate support needs will improve Outcome: Adequate for Discharge   Problem: Health Behavior/Discharge Planning: Goal: Compliance with prescribed medication regimen will improve Outcome: Adequate for Discharge   Problem: Medication: Goal: Risk for medication side effects will decrease Outcome: Adequate for Discharge   Problem: Clinical Measurements: Goal: Complications related to the disease process, condition or treatment will be avoided or minimized Outcome: Adequate for Discharge Goal: Diagnostic test results will improve Outcome: Adequate for Discharge   Problem: Safety: Goal: Verbalization of understanding the information provided will improve Outcome: Adequate for Discharge   Problem: Self-Concept: Goal: Level of anxiety will decrease Outcome: Adequate for Discharge Goal: Ability to verbalize feelings about condition will improve Outcome: Adequate for Discharge   

## 2022-04-21 NOTE — Progress Notes (Signed)
   04/21/22 0900  Clinical Encounter Type  Visited With Patient  Visit Type Initial  Referral From Nurse  Consult/Referral To Chaplain   Chaplain responded consult to give POA information.

## 2022-04-21 NOTE — Discharge Summary (Signed)
Physician Discharge Summary   Jeremy Browning  male DOB: May 22, 1961  NID:[id number]  PCP: Crist Fat, MD  Admit date: 04/19/2022 Discharge date: 04/21/2022  Admitted From: SNF LTC Disposition:  SNF LTC CODE STATUS: Full code   Hospital Course:  For full details, please see H&P, progress notes, consult notes and ancillary notes.  Briefly,  Jeremy Browning is a 61 y.o. 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  He received Ativan 1 mg IM at the facility and has had no further seizures.   * Breakthrough seizure (HCC) Seizure disorder as sequela of cerebrovascular accident [facility]) --head CT no acute finding.   - Continue home keppra 1500mg  bid --increased depakote to 750mg  DR bid (from 500 mg BID), per neuro rec - Patient should follow up with established outpatient neurologist Dr. and have VPA levels checked in 1-2 wks as outpatient.   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   Controlled type 2 diabetes mellitus without complication, without long-term current use of insulin (HCC) Recent A1c 5.5, well controlled   Essential hypertension Continue losartan  Home coreg held due to bradycardia   HFrEF (heart failure with reduced ejection fraction) (HCC) EF 45 to 50%  in May 2022 in the setting of out of hospital V. tach arrest with cardiogenic shock, now 50-55 by July 2023 --Continue furosemide  --hold home coreg 2/2 bradycardia to 40's-50's --outpatient f/u with cardiology Dr. June 2022 to determine whether to resume beta blocker   Unless noted above, medications under "STOP" list are ones pt was not taking PTA.  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 The score is based on dignosis, age, lab data, medications, orders, and past utilization.   Low:  0-14.9   Medium: 15-21.9   High: 22-29.9   Extreme: 30 and above         Discharge Instructions:  Allergies as of 04/21/2022   No Known Allergies      Medication List     STOP taking these  medications    Effer-K 10 MEQ Tbef Generic drug: Potassium Bicarb-Citric Acid       TAKE these medications    aspirin EC 81 MG tablet Take 81 mg by mouth daily.   atorvastatin 80 MG tablet Commonly known as: LIPITOR Take 80 mg by mouth daily.   carvedilol 6.25 MG tablet Commonly known as: COREG Hold until followup with cardiology due  to HR in 40's-50's. What changed:  how much to take how to take this when to take this additional instructions   diclofenac Sodium 1 % Gel Commonly known as: VOLTAREN Apply 2 g topically 4 (four) times daily.   divalproex 250 MG DR tablet Commonly known as: Depakote Take 3 tablets (750 mg total) by mouth 2 (two) times daily. What changed:  medication strength how much to take   furosemide 20 MG tablet Commonly known as: LASIX Take 20 mg by mouth daily.   levETIRAcetam 750 MG tablet Commonly known as: KEPPRA Take 2 tablets (1,500 mg total) by mouth 2 (two) times daily.   LORazepam 2 MG/ML injection Commonly known as: ATIVAN every 8 (eight) hours. Inject 0.5 ml intramuscularly one time only related to epilepsy.   losartan 25 MG tablet Commonly known as: COZAAR Take 25 mg by mouth daily.   sertraline 25 MG tablet Commonly known as: ZOLOFT Take 25 mg by mouth daily.   tamsulosin 0.4 MG Caps capsule Commonly known as: FLOMAX Take 0.4 mg by mouth daily.         Follow-up Information     Lonell Face, MD Follow up in 1 week(s).   Specialty: Neurology Why: VPA levels Contact information: [address] [facility] [place] [address] [phone]         End, Cristal Deer, MD Follow up in 2 week(s).   Specialty: Cardiology Contact information: [address] [place] [address] [phone]                 No Known Allergies   The results of significant diagnostics from this hospitalization (including imaging, microbiology, ancillary and laboratory) are listed below for reference.   Consultations:   Procedures/Studies: CT HEAD WO CONTRAST ( )  Result Date: 04/20/2022 CLINICAL DATA:  Seizure disorder.  History of stroke. EXAM: CT HEAD WITHOUT CONTRAST TECHNIQUE: Contiguous axial images were obtained from the base of the skull through the vertex without intravenous contrast. RADIATION DOSE  REDUCTION: This exam was performed according to the departmental dose-optimization program which includes automated exposure control, adjustment of the mA and/or kV according to patient size and/or use of iterative reconstruction technique. COMPARISON:  CT head 03/08/2022 FINDINGS: Brain: Chronic right MCA infarct unchanged. Additional chronic infarct in the left occipital parietal lobe unchanged. Negative for acute infarct, hemorrhage, mass. Negative for hydrocephalus. Vascular: Negative for hyperdense vessel Skull: Negative Sinuses/Orbits: Mild mucosal edema paranasal sinuses. Negative orbit Other: None IMPRESSION: No acute abnormality.  No change from prior studies Large territory chronic right MCA infarct. Chronic infarct left occipital parietal lobe. Electronically Signed   By: Marlan Palau M.D.   On: 04/20/2022 10:38      Labs: BNP (last 3 results) Recent Labs    03/08/22 0053  BNP 127.0*   Basic Metabolic Panel: Recent Labs  Lab 04/19/22 1924 04/21/22 0533  NA 139 137  K 5.3* 4.4  CL 107 109  CO2 23 22  GLUCOSE 96 77  BUN 22 18  CREATININE 1.56* 1.38*  CALCIUM 9.0 8.4*  MG  --  2.0   Liver Function Tests: Recent Labs  Lab 04/20/22 1220  AST 24  ALT 18  ALKPHOS 64  BILITOT 0.8  PROT 7.5  ALBUMIN 3.6   No results for input(s): "LIPASE", "AMYLASE" in the last 168 hours. Recent Labs  Lab 04/20/22 1220  AMMONIA 18   CBC: Recent Labs  Lab 04/19/22 1924 04/21/22 0533  WBC 6.6 4.4  HGB 13.8 12.5*  HCT 44.3 39.4  MCV 81.0 80.4  PLT 110* PLATELET CLUMPS NOTED ON SMEAR, UNABLE TO ESTIMATE   Cardiac Enzymes: No results for input(s): "CKTOTAL", "CKMB", "CKMBINDEX", "TROPONINI" in the last 168 hours. BNP: Invalid input(s): "POCBNP" CBG: No results for input(s): "GLUCAP" in the last 168 hours. D-Dimer No results for input(s): "DDIMER" in the last 72 hours. Hgb A1c No results for input(s): "HGBA1C" in the last 72 hours. Lipid Profile No results for input(s):  "CHOL", "HDL", "LDLCALC", "TRIG", "CHOLHDL", "LDLDIRECT" in the last 72 hours. Thyroid function studies No results for input(s): "TSH", "T4TOTAL", "T3FREE", "THYROIDAB" in the last 72 hours.  Invalid input(s): "FREET3" Anemia work up No results for input(s): "VITAMINB12", "FOLATE", "FERRITIN", "TIBC", "IRON", "RETICCTPCT" in the last 72 hours. Urinalysis    Component Value Date/Time   COLORURINE YELLOW (A) 03/08/2022 0117   APPEARANCEUR CLEAR (A) 03/08/2022 0117   LABSPEC 1.011 03/08/2022 0117   PHURINE 6.0 03/08/2022 0117   GLUCOSEU NEGATIVE 03/08/2022 0117   HGBUR NEGATIVE 03/08/2022 0117   BILIRUBINUR NEGATIVE 03/08/2022 0117   KETONESUR NEGATIVE 03/08/2022 0117   PROTEINUR 100 (A) 03/08/2022 0117   NITRITE NEGATIVE 03/08/2022 0117   LEUKOCYTESUR NEGATIVE 03/08/2022 0117   Sepsis Labs Recent Labs  Lab 04/19/22 1924 04/21/22 0533  WBC 6.6 4.4   Microbiology No results found for this or any previous visit (from the past 240 hour(s)).   Total time spend on discharging this patient, including the last patient exam, discussing the hospital stay, instructions for ongoing care as it relates to all pertinent caregivers, as well as preparing the medical discharge records, prescriptions, and/or referrals as applicable, is 35 minutes.    Enzo Bi, MD  [facility] 04/21/2022, 8:38 AM

## 2022-04-21 NOTE — TOC Transition Note (Signed)
Transition of Care [facility]) - CM/SW Discharge Note   Patient Details  Name: Jeremy Browning MRN: [id number] Date of Birth: 1961/08/18  Transition of Care [facility]) CM/SW Contact:  Carley Hammed, LCSWA Phone Number: 04/21/2022, 11:09 AM   Clinical Narrative:    Pt to be transported to [facility] care via [facility].  Nurse to call report to [phone].   Final next level of care: Long Term Nursing Home Barriers to Discharge: Barriers Resolved   Patient Goals and CMS Choice        Discharge Placement              Patient chooses bed at: [facility] Patient to be transferred to facility by: [facility] Name of family member notified: Barbara Cower Patient and family notified of of transfer: 04/21/22  Discharge Plan and Services                                     Social Determinants of Health (SDOH) Interventions     Readmission Risk Interventions     No data to display

## 2022-05-05 ENCOUNTER — Ambulatory Visit: Payer: Medicaid Other | Admitting: Cardiology

## 2022-06-20 ENCOUNTER — Ambulatory Visit: Payer: Medicaid Other | Attending: Cardiology | Admitting: Cardiology

## 2022-06-21 ENCOUNTER — Encounter: Payer: Self-pay | Admitting: Cardiology

## 2022-06-30 ENCOUNTER — Encounter: Payer: Self-pay | Admitting: Nurse Practitioner

## 2022-07-03 ENCOUNTER — Other Ambulatory Visit: Payer: Self-pay | Admitting: Nurse Practitioner

## 2022-07-03 DIAGNOSIS — R609 Edema, unspecified: Secondary | ICD-10-CM

## 2022-07-17 ENCOUNTER — Ambulatory Visit
Admission: RE | Admit: 2022-07-17 | Discharge: 2022-07-17 | Disposition: A | Discharge status: Home / Self Care | Payer: Medicaid Other | Source: Ambulatory Visit | Attending: Nurse Practitioner | Admitting: Nurse Practitioner

## 2022-07-17 DIAGNOSIS — R609 Edema, unspecified: Secondary | ICD-10-CM | POA: Diagnosis present

## 2022-11-21 ENCOUNTER — Emergency Department: Payer: Medicaid Other

## 2022-11-21 ENCOUNTER — Other Ambulatory Visit: Payer: Self-pay

## 2022-11-21 DIAGNOSIS — G40909 Epilepsy, unspecified, not intractable, without status epilepticus: Principal | ICD-10-CM | POA: Diagnosis present

## 2022-11-21 DIAGNOSIS — Z7901 Long term (current) use of anticoagulants: Secondary | ICD-10-CM

## 2022-11-21 DIAGNOSIS — Z8674 Personal history of sudden cardiac arrest: Secondary | ICD-10-CM

## 2022-11-21 DIAGNOSIS — Z6837 Body mass index (BMI) 37.0-37.9, adult: Secondary | ICD-10-CM

## 2022-11-21 DIAGNOSIS — D631 Anemia in chronic kidney disease: Secondary | ICD-10-CM | POA: Diagnosis present

## 2022-11-21 DIAGNOSIS — I428 Other cardiomyopathies: Secondary | ICD-10-CM | POA: Diagnosis present

## 2022-11-21 DIAGNOSIS — I447 Left bundle-branch block, unspecified: Secondary | ICD-10-CM | POA: Diagnosis present

## 2022-11-21 DIAGNOSIS — Z7982 Long term (current) use of aspirin: Secondary | ICD-10-CM

## 2022-11-21 DIAGNOSIS — Z825 Family history of asthma and other chronic lower respiratory diseases: Secondary | ICD-10-CM

## 2022-11-21 DIAGNOSIS — I69354 Hemiplegia and hemiparesis following cerebral infarction affecting left non-dominant side: Secondary | ICD-10-CM

## 2022-11-21 DIAGNOSIS — I130 Hypertensive heart and chronic kidney disease with heart failure and stage 1 through stage 4 chronic kidney disease, or unspecified chronic kidney disease: Secondary | ICD-10-CM | POA: Diagnosis present

## 2022-11-21 DIAGNOSIS — R001 Bradycardia, unspecified: Secondary | ICD-10-CM | POA: Diagnosis present

## 2022-11-21 DIAGNOSIS — Z8249 Family history of ischemic heart disease and other diseases of the circulatory system: Secondary | ICD-10-CM

## 2022-11-21 DIAGNOSIS — N1831 Chronic kidney disease, stage 3a: Secondary | ICD-10-CM | POA: Diagnosis present

## 2022-11-21 DIAGNOSIS — E1122 Type 2 diabetes mellitus with diabetic chronic kidney disease: Secondary | ICD-10-CM | POA: Diagnosis present

## 2022-11-21 DIAGNOSIS — J449 Chronic obstructive pulmonary disease, unspecified: Secondary | ICD-10-CM | POA: Diagnosis present

## 2022-11-21 DIAGNOSIS — Z79899 Other long term (current) drug therapy: Secondary | ICD-10-CM

## 2022-11-21 DIAGNOSIS — Z823 Family history of stroke: Secondary | ICD-10-CM

## 2022-11-21 DIAGNOSIS — I5042 Chronic combined systolic (congestive) and diastolic (congestive) heart failure: Secondary | ICD-10-CM | POA: Diagnosis present

## 2022-11-21 DIAGNOSIS — I2729 Other secondary pulmonary hypertension: Secondary | ICD-10-CM | POA: Diagnosis present

## 2022-11-21 DIAGNOSIS — Z833 Family history of diabetes mellitus: Secondary | ICD-10-CM

## 2022-11-21 DIAGNOSIS — Z841 Family history of disorders of kidney and ureter: Secondary | ICD-10-CM

## 2022-11-21 DIAGNOSIS — D696 Thrombocytopenia, unspecified: Secondary | ICD-10-CM | POA: Diagnosis present

## 2022-11-21 DIAGNOSIS — G4733 Obstructive sleep apnea (adult) (pediatric): Secondary | ICD-10-CM | POA: Diagnosis present

## 2022-11-21 DIAGNOSIS — Z818 Family history of other mental and behavioral disorders: Secondary | ICD-10-CM

## 2022-11-21 DIAGNOSIS — Z87891 Personal history of nicotine dependence: Secondary | ICD-10-CM

## 2022-11-21 DIAGNOSIS — Z811 Family history of alcohol abuse and dependence: Secondary | ICD-10-CM

## 2022-11-21 DIAGNOSIS — I69398 Other sequelae of cerebral infarction: Secondary | ICD-10-CM

## 2022-11-21 DIAGNOSIS — Z7984 Long term (current) use of oral hypoglycemic drugs: Secondary | ICD-10-CM

## 2022-11-21 LAB — BASIC METABOLIC PANEL
Anion gap: 10 (ref 5–15)
BUN: 28 mg/dL — ABNORMAL HIGH (ref 8–23)
CO2: 25 mmol/L (ref 22–32)
Calcium: 8.6 mg/dL — ABNORMAL LOW (ref 8.9–10.3)
Chloride: 102 mmol/L (ref 98–111)
Creatinine, Ser: 1.66 mg/dL — ABNORMAL HIGH (ref 0.61–1.24)
GFR, Estimated: 46 mL/min — ABNORMAL LOW (ref >60)
Glucose, Bld: 96 mg/dL (ref 70–99)
Potassium: 4.2 mmol/L (ref 3.5–5.1)
Sodium: 137 mmol/L (ref 135–145)

## 2022-11-21 LAB — CBC
HCT: 37.3 % — ABNORMAL LOW (ref 39.0–52.0)
Hemoglobin: 12.1 g/dL — ABNORMAL LOW (ref 13.0–17.0)
MCH: 28 pg (ref 26.0–34.0)
MCHC: 32.4 g/dL (ref 30.0–36.0)
MCV: 86.3 fL (ref 80.0–100.0)
Platelets: 117 10*3/uL — ABNORMAL LOW (ref 150–400)
RBC: 4.32 MIL/uL (ref 4.22–5.81)
RDW: 14.8 % (ref 11.5–15.5)
WBC: 5 10*3/uL (ref 4.0–10.5)
nRBC: 0 % (ref 0.0–0.2)

## 2022-11-21 LAB — VALPROIC ACID LEVEL: Valproic Acid Lvl: 104 ug/mL — ABNORMAL HIGH (ref 50.0–100.0)

## 2022-11-21 MED ORDER — LORAZEPAM 2 MG/ML IJ SOLN
1.0000 mg | Freq: Once | INTRAMUSCULAR | Status: AC
  Administered 2022-11-21: 1 mg via INTRAMUSCULAR
  Filled 2022-11-21: qty 1

## 2022-11-21 NOTE — ED Triage Notes (Signed)
Pt BIB [facility] from [facility]. Hx of epilepsy and facility reported no missed doses of medications. Reported to EMS That pt has been having tremors/convulsions constant since this AM. This is not pt norm. Pt does have orders for IM diazepam in this scenario but was reportedly not administered due to it being back ordered. Pt is noted to be continually twitching in triage. Pt is alert and oriented x3 and at baseline per facility to EMS. Pt denies pain. Pt is bedbound at baseline and facility denies any fall or trauma to head to EMS. No meds given by EMS en route.   EMS Vitals:  CBG 104 130/35 HR -64  RR 16 95% RA  Pt placed in stretcher in triage and placed in middle. Seizure pads put in place on bed. Pt noted to be breathing unlabored with symmetric chest rise and fall. Per pt paperwork from facility, pt takes depakote and keppra for epilepsy.

## 2022-11-22 ENCOUNTER — Inpatient Hospital Stay
Admission: EM | Admit: 2022-11-22 | Discharge: 2022-11-23 | DRG: 101 | Disposition: A | Discharge status: Skilled Nursing Facility | Payer: Medicaid Other | Source: Skilled Nursing Facility | Attending: Internal Medicine | Admitting: Internal Medicine

## 2022-11-22 ENCOUNTER — Observation Stay
Admit: 2022-11-22 | Discharge: 2022-11-22 | Disposition: A | Discharge status: Home / Self Care | Payer: Medicaid Other | Attending: Internal Medicine | Admitting: Internal Medicine

## 2022-11-22 DIAGNOSIS — G4733 Obstructive sleep apnea (adult) (pediatric): Secondary | ICD-10-CM | POA: Diagnosis present

## 2022-11-22 DIAGNOSIS — Z7982 Long term (current) use of aspirin: Secondary | ICD-10-CM | POA: Diagnosis not present

## 2022-11-22 DIAGNOSIS — G40919 Epilepsy, unspecified, intractable, without status epilepticus: Secondary | ICD-10-CM | POA: Diagnosis present

## 2022-11-22 DIAGNOSIS — N1831 Chronic kidney disease, stage 3a: Secondary | ICD-10-CM | POA: Diagnosis present

## 2022-11-22 DIAGNOSIS — I130 Hypertensive heart and chronic kidney disease with heart failure and stage 1 through stage 4 chronic kidney disease, or unspecified chronic kidney disease: Secondary | ICD-10-CM | POA: Diagnosis present

## 2022-11-22 DIAGNOSIS — Z7984 Long term (current) use of oral hypoglycemic drugs: Secondary | ICD-10-CM | POA: Diagnosis not present

## 2022-11-22 DIAGNOSIS — R569 Unspecified convulsions: Principal | ICD-10-CM

## 2022-11-22 DIAGNOSIS — R001 Bradycardia, unspecified: Secondary | ICD-10-CM | POA: Diagnosis present

## 2022-11-22 DIAGNOSIS — I69354 Hemiplegia and hemiparesis following cerebral infarction affecting left non-dominant side: Secondary | ICD-10-CM | POA: Diagnosis not present

## 2022-11-22 DIAGNOSIS — Z8249 Family history of ischemic heart disease and other diseases of the circulatory system: Secondary | ICD-10-CM | POA: Diagnosis not present

## 2022-11-22 DIAGNOSIS — Z87891 Personal history of nicotine dependence: Secondary | ICD-10-CM | POA: Diagnosis not present

## 2022-11-22 DIAGNOSIS — Z811 Family history of alcohol abuse and dependence: Secondary | ICD-10-CM | POA: Diagnosis not present

## 2022-11-22 DIAGNOSIS — D696 Thrombocytopenia, unspecified: Secondary | ICD-10-CM | POA: Diagnosis present

## 2022-11-22 DIAGNOSIS — I69398 Other sequelae of cerebral infarction: Secondary | ICD-10-CM | POA: Diagnosis not present

## 2022-11-22 DIAGNOSIS — Z6837 Body mass index (BMI) 37.0-37.9, adult: Secondary | ICD-10-CM | POA: Diagnosis not present

## 2022-11-22 DIAGNOSIS — I10 Essential (primary) hypertension: Secondary | ICD-10-CM | POA: Diagnosis present

## 2022-11-22 DIAGNOSIS — I2729 Other secondary pulmonary hypertension: Secondary | ICD-10-CM | POA: Diagnosis present

## 2022-11-22 DIAGNOSIS — G40909 Epilepsy, unspecified, not intractable, without status epilepticus: Secondary | ICD-10-CM

## 2022-11-22 DIAGNOSIS — I447 Left bundle-branch block, unspecified: Secondary | ICD-10-CM | POA: Diagnosis present

## 2022-11-22 DIAGNOSIS — J449 Chronic obstructive pulmonary disease, unspecified: Secondary | ICD-10-CM | POA: Diagnosis present

## 2022-11-22 DIAGNOSIS — I5020 Unspecified systolic (congestive) heart failure: Secondary | ICD-10-CM | POA: Diagnosis present

## 2022-11-22 DIAGNOSIS — I69954 Hemiplegia and hemiparesis following unspecified cerebrovascular disease affecting left non-dominant side: Secondary | ICD-10-CM

## 2022-11-22 DIAGNOSIS — Z79899 Other long term (current) drug therapy: Secondary | ICD-10-CM | POA: Diagnosis not present

## 2022-11-22 DIAGNOSIS — E1122 Type 2 diabetes mellitus with diabetic chronic kidney disease: Secondary | ICD-10-CM | POA: Diagnosis present

## 2022-11-22 DIAGNOSIS — D631 Anemia in chronic kidney disease: Secondary | ICD-10-CM | POA: Diagnosis present

## 2022-11-22 DIAGNOSIS — Z7901 Long term (current) use of anticoagulants: Secondary | ICD-10-CM | POA: Diagnosis not present

## 2022-11-22 DIAGNOSIS — I428 Other cardiomyopathies: Secondary | ICD-10-CM | POA: Diagnosis present

## 2022-11-22 DIAGNOSIS — I5042 Chronic combined systolic (congestive) and diastolic (congestive) heart failure: Secondary | ICD-10-CM | POA: Diagnosis present

## 2022-11-22 LAB — URINALYSIS, ROUTINE W REFLEX MICROSCOPIC
Bilirubin Urine: NEGATIVE
Glucose, UA: NEGATIVE mg/dL
Hgb urine dipstick: NEGATIVE
Ketones, ur: NEGATIVE mg/dL
Leukocytes,Ua: NEGATIVE
Nitrite: NEGATIVE
Protein, ur: NEGATIVE mg/dL
Specific Gravity, Urine: 1.011 (ref 1.005–1.030)
pH: 6 (ref 5.0–8.0)

## 2022-11-22 LAB — ECHOCARDIOGRAM COMPLETE
AR max vel: 2.09 cm2
AV Area VTI: 2.08 cm2
AV Area mean vel: 2.17 cm2
AV Mean grad: 4 mmHg
AV Peak grad: 7.7 mmHg
Ao pk vel: 1.39 m/s
Area-P 1/2: 2.11 cm2
MV VTI: 1.47 cm2
S' Lateral: 3.9 cm

## 2022-11-22 LAB — MAGNESIUM: Magnesium: 2.1 mg/dL (ref 1.7–2.4)

## 2022-11-22 MED ORDER — LEVETIRACETAM 500 MG PO TABS
1500.0000 mg | ORAL_TABLET | Freq: Two times a day (BID) | ORAL | Status: DC
  Administered 2022-11-22 – 2022-11-23 (×3): 1500 mg via ORAL
  Filled 2022-11-22 (×3): qty 3

## 2022-11-22 MED ORDER — FUROSEMIDE 40 MG PO TABS
40.0000 mg | ORAL_TABLET | Freq: Every day | ORAL | Status: DC
  Administered 2022-11-22 – 2022-11-23 (×2): 40 mg via ORAL
  Filled 2022-11-22 (×2): qty 1

## 2022-11-22 MED ORDER — APIXABAN 5 MG PO TABS
5.0000 mg | ORAL_TABLET | Freq: Two times a day (BID) | ORAL | Status: DC
  Administered 2022-11-22 – 2022-11-23 (×3): 5 mg via ORAL
  Filled 2022-11-22 (×3): qty 1

## 2022-11-22 MED ORDER — CARVEDILOL 6.25 MG PO TABS: 6.2500 mg | ORAL_TABLET | Freq: Two times a day (BID) | ORAL | Status: DC

## 2022-11-22 MED ORDER — TAMSULOSIN HCL 0.4 MG PO CAPS
0.4000 mg | ORAL_CAPSULE | Freq: Every day | ORAL | Status: DC
  Administered 2022-11-22 – 2022-11-23 (×2): 0.4 mg via ORAL
  Filled 2022-11-22 (×2): qty 1

## 2022-11-22 MED ORDER — ORAL CARE MOUTH RINSE
15.0000 mL | OROMUCOSAL | Status: DC
  Administered 2022-11-22 (×4): 15 mL via OROMUCOSAL
  Filled 2022-11-22 (×21): qty 15

## 2022-11-22 MED ORDER — ASPIRIN 81 MG PO TBEC
81.0000 mg | DELAYED_RELEASE_TABLET | Freq: Every day | ORAL | Status: DC
  Administered 2022-11-22 – 2022-11-23 (×2): 81 mg via ORAL
  Filled 2022-11-22 (×2): qty 1

## 2022-11-22 MED ORDER — ORAL CARE MOUTH RINSE: 15.0000 mL | OROMUCOSAL | Status: DC | PRN

## 2022-11-22 MED ORDER — SERTRALINE HCL 50 MG PO TABS
50.0000 mg | ORAL_TABLET | Freq: Every day | ORAL | Status: DC
  Administered 2022-11-22 – 2022-11-23 (×2): 50 mg via ORAL
  Filled 2022-11-22 (×2): qty 1

## 2022-11-22 MED ORDER — SODIUM CHLORIDE 0.9 % IV SOLN
75.0000 mL/h | INTRAVENOUS | Status: DC
  Administered 2022-11-22: 75 mL/h via INTRAVENOUS

## 2022-11-22 MED ORDER — LOSARTAN POTASSIUM 50 MG PO TABS: 25.0000 mg | ORAL_TABLET | Freq: Every day | ORAL | Status: DC

## 2022-11-22 MED ORDER — LEVETIRACETAM IN NACL 1000 MG/100ML IV SOLN
1000.0000 mg | Freq: Once | INTRAVENOUS | Status: AC
Start: 2022-11-22 — End: 2022-11-22
  Administered 2022-11-22: 1000 mg via INTRAVENOUS
  Filled 2022-11-22: qty 100

## 2022-11-22 MED ORDER — ACETAMINOPHEN 650 MG RE SUPP: 650.0000 mg | RECTAL | Status: DC | PRN

## 2022-11-22 MED ORDER — DIVALPROEX SODIUM 500 MG PO DR TAB
750.0000 mg | DELAYED_RELEASE_TABLET | Freq: Two times a day (BID) | ORAL | Status: DC
  Administered 2022-11-22 – 2022-11-23 (×3): 750 mg via ORAL
  Filled 2022-11-22 (×3): qty 1

## 2022-11-22 MED ORDER — LORAZEPAM 2 MG/ML IJ SOLN: 2.0000 mg | INTRAMUSCULAR | Status: DC | PRN

## 2022-11-22 MED ORDER — ATORVASTATIN CALCIUM 20 MG PO TABS
80.0000 mg | ORAL_TABLET | Freq: Every day | ORAL | Status: DC
  Administered 2022-11-22: 80 mg via ORAL
  Filled 2022-11-22: qty 4

## 2022-11-22 MED ORDER — LEVETIRACETAM IN NACL 1000 MG/100ML IV SOLN
INTRAVENOUS | Status: AC
  Filled 2022-11-22: qty 100

## 2022-11-22 MED ORDER — ACETAMINOPHEN 325 MG PO TABS: 650.0000 mg | ORAL_TABLET | ORAL | Status: DC | PRN

## 2022-11-22 NOTE — Progress Notes (Signed)
  Progress Note   Patient: Jeremy Browning [id number] DOB: 10-28-1960 DOA: 11/22/2022     0 DOS: the patient was seen and examined on 11/22/2022   Brief hospital course: Jeremy Browning is a 62 y.o. 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 Discussed with Dr. Rory Percy, patient current regimen for seizure is adequate.  No need adjustment. Patient also had a significant sinus bradycardia, seen by cardiology.  Beta-blocker was on hold.   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 Patient is still pretty sleepy postictal, will keep patient overnight.  Likely will discharge home tomorrow.   Sinus bradycardia HFrEF (heart failure with reduced ejection fraction) ([place]) Appreciate cardiology consult, beta-blocker on hold. Currently does not have any volume overload.     Anemia Thrombocytopenia (HCC) Continue to follow.  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  Will  continue.   Stage 3a chronic kidney disease (HCC) Renal function at baseline   COPD (chronic obstructive pulmonary disease) (HCC) No exacerbation.     Essential hypertension Continue losartan and carvedilol   Morbid obesity BMI 37.29 with multiple comorbidities.     Subjective:  Patient is sleepy, but able to maintain a conversation.  Physical Exam: Vitals:   11/22/22 0730 11/22/22 0800 11/22/22 1030 11/22/22 1300  BP: 126/70 (!) 143/45 (!) 133/44 (!) 129/34  Pulse: (!) 49 (!) 40 (!) 40 (!) 44  Resp: 16 13 18 12   Temp:  (!) 97.3 F (36.3 C) (!) 97.5 F (36.4 C)   TempSrc:      SpO2:  99% 99% 99%  Weight:      Height:       General exam: Appears calm and comfortable  Respiratory system: Clear to auscultation. Respiratory effort normal. Cardiovascular system: S1 & S2 heard, bradycardic,  no JVD, murmurs, rubs, gallops or clicks. No pedal edema. Gastrointestinal system: Abdomen is nondistended, soft and nontender. No organomegaly or masses felt. Normal bowel sounds heard. Central nervous system: Drowsy, oriented to person and place, disoriented to time. Extremities: Symmetric 5 x 5 power. Skin: No rashes, lesions or ulcers Psychiatry:  Mood & affect appropriate.    Data Reviewed:  Lab results reviewed.  Family Communication: None  Disposition: Status is: Observation      Time spent: no charge  minutes  Author: Sharen Hones, MD 11/22/2022 2:07 PM  For on call review [email or web address].

## 2022-11-22 NOTE — ED Provider Notes (Signed)
[facility] Provider Note    Event Date/Time   First MD Initiated Contact with Patient 11/22/22 0030     (approximate)   History   Seizures and Tremors   HPI  Jeremy Browning is a 62 y.o. male with history of previous right MCA infarct with left-sided hemiparesis, seizures on Keppra and Depakote, hypertension, diabetes, CHF who presents emergency department with focal seizures in his upper extremities ongoing for hours today.  Reports compliance with his medications.  Seizure activity stopped after receiving Ativan in triage.  He denies any fevers, head injury, headache, new numbness or weakness, vomiting, diarrhea, cough.   History provided by patient.    Past Medical History:  Diagnosis Date   CHF (congestive heart failure)    Diabetes mellitus type II, non insulin dependent    Essential hypertension Raquel Sarna like   h/o Proteus pneumonia ([place]) 02/17/2021   Heart failure    Unknown details.  By report no cardiac surgery   Seizures    Severe hypoxic-ischemic encephalopathy    Sleep apnea    Stroke    Venous stasis dermatitis of left lower extremity     Past Surgical History:  Procedure Laterality Date   HARDWARE REMOVAL Left 02/10/2022   Procedure: HARDWARE REMOVAL LEFT TIBIA;  Surgeon: Newt Minion, MD;  Location: [place];  Service: Orthopedics;  Laterality: Left;   I & D EXTREMITY Left 02/10/2022   Procedure: DEBRIDEMENT OF LEFT TIBIA;  Surgeon: Newt Minion, MD;  Location: [place];  Service: Orthopedics;  Laterality: Left;   LEFT HEART CATH AND CORONARY ANGIOGRAPHY N/A 12/25/2020   Procedure: LEFT HEART CATH AND CORONARY ANGIOGRAPHY;  Surgeon: Leonie Man, MD;  Location: [place] [facility];  Service: Cardiovascular;  Laterality: N/A; (In setting of Acute Hypoxic and Hypercapnic Respiratory Failure with Flash Pulm Edema/Hypertensive Emergency) - Angiographically minimal CAD.  Moderately reduced LV function with EF of 35 to 45%. LV P-EDP 101/21  mmHg - 30 mmHg   LEFT HEART CATH AND CORONARY ANGIOGRAPHY  09/12/2018   [facility]): Ostial LAD ~20% otherwise normal coronary arteries. LVEDP 20 mmHg;- HTN Emergency related Type II MI>   PEG PLACEMENT N/A 01/11/2021   Procedure: PERCUTANEOUS ENDOSCOPIC GASTROSTOMY (PEG) PLACEMENT;  Surgeon: Lucilla Lame, MD;  Location: [facility];  Service: Endoscopy;  Laterality: N/A;   RIGHT HEART CATH N/A 12/25/2020   Procedure: RIGHT HEART CATH;  Surgeon: Leonie Man, MD;  Location: [place] [facility];  Service: Cardiovascular; R IJ: (Acute Hypoxic and Hypercapnic Respiratory Failure with Flash Pulm Edema/Hypertensive Emergency): CO-CI 6.54-2.82. Mild Secondary Pulmonary Hypertension (PCWP 30 mmHg.  PAP 47/13 mmHg with a mean PAP of 33 mmHg, RAP 22 mmHg in setting of AoP-MAP 97/59 mmHg - 71 mmHg,   TRACHEOSTOMY TUBE PLACEMENT N/A 01/12/2021   Procedure: TRACHEOSTOMY;  Surgeon: Carloyn Manner, MD;  Location: [facility];  Service: ENT;  Laterality: N/A;   TRANSTHORACIC ECHOCARDIOGRAM  09/11/2018   [facility]: Moderately reduced LVEF ~35%. 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 Mild-mod AI, Trivial MR?TR. LVEDD 6.0 (reduced compared to 2017: EF ~40-45%, LVEDD 5.6   TRANSTHORACIC ECHOCARDIOGRAM  12/27/2020   [facility]: (In Setting of Acute Hypoxic and Hypercapnic Respiratory Failure with Flash Pulm Edema/Hypertensive Emergency)  EF 45-50%. Mildly reduced LVEF with global HK. Moderately dilated LV with Gr 1 DD - mild LA dilation. Normal RV size Fxn.  Normal [place]   TRANSTHORACIC ECHOCARDIOGRAM  03/07/2021   [facility]: EF improved to 60-65% with Nl  LV Fxn & ~ normal diastolic fxn. Normal RV size & fxn. Mild MR & AI.  Normal LA size.   Unknown      MEDICATIONS:  Prior to Admission medications   Medication Sig Start Date End Date Taking? Authorizing Provider  aspirin EC 81 MG tablet Take 81 mg by mouth daily.    [provider]  atorvastatin (LIPITOR) 80 MG tablet Take 80 mg by mouth daily.     [provider]  carvedilol (COREG) 6.25 MG tablet Hold until followup with cardiology due to HR in 40's-50's. 04/21/22   Enzo Bi, MD  diclofenac Sodium (VOLTAREN) 1 % GEL Apply 2 g topically 4 (four) times daily.    [provider]  divalproex (DEPAKOTE) 250 MG DR tablet Take 3 tablets (750 mg total) by mouth 2 (two) times daily. 04/21/22   Enzo Bi, MD  furosemide (LASIX) 20 MG tablet Take 20 mg by mouth daily.    [provider]  levETIRAcetam (KEPPRA) 750 MG tablet Take 2 tablets (1,500 mg total) by mouth 2 (two) times daily. 03/13/22   Patrecia Pour, MD  LORazepam (ATIVAN) 2 MG/ML injection every 8 (eight) hours. Inject 0.5 ml intramuscularly one time only related to epilepsy.    [provider]  losartan (COZAAR) 25 MG tablet Take 25 mg by mouth daily.    [provider]  sertraline (ZOLOFT) 25 MG tablet Take 25 mg by mouth daily.    [provider]  tamsulosin (FLOMAX) 0.4 MG CAPS capsule Take 0.4 mg by mouth daily.    [provider]    Physical Exam   Triage Vital Signs: ED Triage Vitals  Enc Vitals Group     BP 11/21/22 2203 (!) 156/60     Pulse Rate 11/21/22 2203 69     Resp 11/21/22 2203 17     Temp 11/21/22 2203 98.5 F (36.9 C)     Temp Source 11/21/22 2203 Oral     SpO2 11/21/22 2203 97 %     Weight 11/21/22 2202 238 lb 1.6 oz (108 kg)     Height 11/21/22 2202 5\' 7"  (1.702 m)     Head Circumference --      Peak Flow --      Pain Score 11/21/22 2202 0     Pain Loc --      Pain Edu? --      Excl. in [place]? --     Most recent vital signs: Vitals:   11/22/22 0015 11/22/22 0130  BP: (!) 168/62 (!) 164/45  Pulse: (!) 56 (!) 54  Resp: 14 16  Temp:    SpO2: 96% 97%    CONSTITUTIONAL: Alert, drowsy but arousable, oriented x 3, chronically ill-appearing HEAD: Normocephalic, atraumatic EYES: Conjunctivae clear, pupils appear equal, sclera nonicteric ENT: normal nose; moist mucous membranes NECK: Supple,  normal ROM CARD: RRR; S1 and S2 appreciated RESP: Normal chest excursion without splinting or tachypnea; breath sounds clear and equal bilaterally; no wheezes, no rhonchi, no rales, no hypoxia or respiratory distress, speaking full sentences ABD/GI: Non-distended; soft, non-tender, no rebound, no guarding, no peritoneal signs BACK: The back appears normal EXT: Normal ROM in all joints; no deformity noted, no edema SKIN: Normal color for age and race; warm; no rash on exposed skin NEURO: Moves all extremities equally, normal speech, currently no seizure-like activity, left-sided hemiparesis PSYCH: The patient's mood and manner are appropriate.   ED Results / Procedures / Treatments   LABS: (all  labs ordered are listed, but only abnormal results are displayed) Labs Reviewed  BASIC METABOLIC PANEL - Abnormal; Notable for the following components:      Result Value   BUN 28 (*)    Creatinine, Ser 1.66 (*)    Calcium 8.6 (*)    GFR, Estimated 46 (*)    All other components within normal limits  CBC - Abnormal; Notable for the following components:   Hemoglobin 12.1 (*)    HCT 37.3 (*)    Platelets 117 (*)    All other components within normal limits  VALPROIC ACID LEVEL - Abnormal; Notable for the following components:   Valproic Acid Lvl 104 (*)    All other components within normal limits  LEVETIRACETAM LEVEL  URINALYSIS, ROUTINE W REFLEX MICROSCOPIC     EKG:  EKG Interpretation  Date/Time:  Tuesday November 21 2022 22:11:55 EDT Ventricular Rate:  65 PR Interval:  158 QRS Duration: 160 QT Interval:  450 QTC Calculation: 468 R Axis:   144 Text Interpretation: Sinus rhythm with Premature supraventricular complexes Left bundle branch block Abnormal ECG When compared with ECG of 21-Nov-2022 22:11, (unconfirmed) Sinus rhythm has replaced Atrial fibrillation Confirmed by Pryor Curia [id number]) on 11/22/2022 5:25:02 AM         RADIOLOGY: My personal review and interpretation of  imaging: CT head shows no acute abnormality.  I have personally reviewed all radiology reports.   CT Head Wo Contrast  Result Date: 11/21/2022 CLINICAL DATA:  Seizure. History of epilepsy. No reported missed dose of medications. Patient continuation twitching in triage. Unable to hold still. EXAM: CT HEAD WITHOUT CONTRAST TECHNIQUE: Contiguous axial images were obtained from the base of the skull through the vertex without intravenous contrast. RADIATION DOSE REDUCTION: This exam was performed according to the departmental dose-optimization program which includes automated exposure control, adjustment of the mA and/or kV according to patient size and/or use of iterative reconstruction technique. COMPARISON:  CT head 04/20/2022 FINDINGS: Exam is degraded by motion despite repeat scanning. Brain: Chronic right MCA territory infarct. Additional chronic infarct left occipital/parietal lobe. No CT evidence of acute infarct. No definite intracranial hemorrhage. Ventricular caliber is unchanged. Vascular: No hyperdense vessel or unexpected calcification. Skull: No definite acute fracture. Sinuses/Orbits: Mucosal thickening right maxillary sinus. Leftward gaze. Other: None. IMPRESSION: 1. Motion degraded study. No definite acute intracranial abnormality. 2. Large territory chronic right MCA infarct. Chronic infarct left occipital parietal lobe. Electronically Signed   By: Placido Sou M.D.   On: 11/21/2022 22:39     PROCEDURES:  Critical Care performed: Yes, see critical care procedure note(s)   CRITICAL CARE Performed by: Cyril Mourning Gailya Tauer   Total critical care time: 40 minutes  Critical care time was exclusive of separately billable procedures and treating other patients.  Critical care was necessary to treat or prevent imminent or life-threatening deterioration.  Critical care was time spent personally by me on the following activities: development of treatment plan with patient and/or surrogate as  well as nursing, discussions with consultants, evaluation of patient's response to treatment, examination of patient, obtaining history from patient or surrogate, ordering and performing treatments and interventions, ordering and review of laboratory studies, ordering and review of radiographic studies, pulse oximetry and re-evaluation of patient's condition.   Marland Kitchen1-3 Lead EKG Interpretation  Performed by: Leiam Hopwood, Delice Bison, DO Authorized by: Adilenne Ashworth, Delice Bison, DO     Interpretation: abnormal     ECG rate:  54   ECG rate assessment: bradycardic  Rhythm: sinus bradycardia     Ectopy: PVCs     Conduction: normal       IMPRESSION / MDM / ASSESSMENT AND PLAN / ED COURSE  I reviewed the triage vital signs and the nursing notes.    Patient here with seizure-like activity that ceased after getting Ativan in triage.  The patient is on the cardiac monitor to evaluate for evidence of arrhythmia and/or significant heart rate changes.   DIFFERENTIAL DIAGNOSIS (includes but not limited to):   Focal seizures, status epilepticus, anemia, electrolyte derangement, UTI, nontherapeutic medication levels   Patient's presentation is most consistent with acute presentation with potential threat to life or bodily function.   PLAN: Workup started from triage.  Stable hemoglobin.  Stable chronic kidney disease.  Normal electrolytes.  Depakote level is 104.  Keppra level pending.  No seizure-like activity on my examination but patient did received IM Ativan in triage.  Will load with IV Keppra.  CT of the head obtained from triage and reviewed/interpreted by myself and the radiologist and shows no acute abnormality.  He has no new focal neurologic deficits but exam is somewhat limited as he is drowsy after getting benzodiazepines.  Given prolonged episodes of focal seizures, will admit to the hospitalist.   [place] [facility]: Medications  levETIRAcetam (KEPPRA) IVPB 1000 mg/100 mL premix (0 mg  Intravenous Hold 11/22/22 0414)  aspirin EC tablet 81 mg (has no administration in time range)  atorvastatin (LIPITOR) tablet 80 mg (has no administration in time range)  furosemide (LASIX) tablet 40 mg (has no administration in time range)  sertraline (ZOLOFT) tablet 50 mg (has no administration in time range)  tamsulosin (FLOMAX) capsule 0.4 mg (has no administration in time range)  apixaban (ELIQUIS) tablet 5 mg (has no administration in time range)  divalproex (DEPAKOTE) DR tablet 750 mg (has no administration in time range)  levETIRAcetam (KEPPRA) tablet 1,500 mg (1,500 mg Oral Given 11/22/22 0506)  Oral care mouth rinse (15 mLs Mouth Rinse Not Given 11/22/22 0414)  Oral care mouth rinse (has no administration in time range)  0.9 %  sodium chloride infusion (0 mL/hr Intravenous Hold 11/22/22 0414)  LORazepam (ATIVAN) injection 2 mg (has no administration in time range)  acetaminophen (TYLENOL) tablet 650 mg (has no administration in time range)    Or  acetaminophen (TYLENOL) suppository 650 mg (has no administration in time range)  LORazepam (ATIVAN) injection 1 mg (1 mg Intramuscular Given 11/21/22 2239)     ED COURSE:  Consulted and discussed patient's case with hospitalist, Dr. Damita Dunnings.  I have recommended admission and consulting physician agrees and will place admission orders.  Patient (and family if present) agree with this plan.   I reviewed all nursing notes, vitals, pertinent previous records.  All labs, EKGs, imaging ordered have been independently reviewed and interpreted by myself.    CONSULTS:  see above   OUTSIDE RECORDS REVIEWED: Reviewed previous admissions for seizure activity.       FINAL CLINICAL IMPRESSION(S) / ED DIAGNOSES   Final diagnoses:  Focal seizure     Rx / DC Orders   ED Discharge Orders     None        Note:  This document was prepared using Dragon voice recognition software and may include unintentional dictation errors.   Telena Peyser,  Delice Bison, DO 11/22/22 [phone]

## 2022-11-22 NOTE — ED Notes (Signed)
IV team notified RN that they do not have staff in the building tonight to help get PIV placed. At time of notification (~0230) ED MD and Hospitalist were notified that RN was unable to start IV Keppra and IVF due to lack of IV access. RN has since asked several ED nurses whether or not they can place US guided IV without success.   Patient is currently Patient resting in bed free from sign of distress. Breathing unlabored with symmetric chest rise and fall. Bed low and locked with side rails raised x2. Call bell in reach and monitor in place. Seizure pads remain in place.

## 2022-11-22 NOTE — ED Notes (Signed)
Charge RN made attempt at IV placement without success.

## 2022-11-22 NOTE — ED Notes (Signed)
Report from [place], rn. Pt cleansed of incontnent urine and male pure wick placed. Axillary temp as charted. Pt moved to room 37, pillows supporting under knees. Siderails up  x4. Pt with call bell at right side, head of bed elevated to 45 degrees.

## 2022-11-22 NOTE — ED Notes (Signed)
IV team at bedside 

## 2022-11-22 NOTE — Plan of Care (Signed)
Case d/w Dr. Roosevelt Locks. Will hold off on formal consult and re-engage as needed.    Amie Portland MD Neurology

## 2022-11-22 NOTE — Progress Notes (Signed)
*  PRELIMINARY RESULTS* Echocardiogram 2D Echocardiogram has been performed.  Jeremy Browning 11/22/2022, 9:19 AM

## 2022-11-22 NOTE — ED Notes (Signed)
MD Damita Dunnings in department. MD notified again that pt does not have IV access and that IV team not available until later this morning. Unable to give IV meds currently. MD also notified that pt SB on monitor with HR in upper 40's while asleep. When he wakes or is stimulated, HR is mid 50's-60's. No new orders at this time.  Patient resting in bed free from sign of distress. Breathing unlabored with symmetric chest rise and fall. Bed low and locked with side rails raised x2. Call bell in reach and monitor in place. Seizure pads remain in place.

## 2022-11-22 NOTE — H&P (Addendum)
History and Physical    Patient: Jeremy Browning [id number] DOB: February 18, 1961 DOA: 11/22/2022 DOS: the patient was seen and examined on 11/22/2022 PCP: Townsend Roger, MD  Patient coming from: SNF  Chief Complaint:  Chief Complaint  Patient presents with   Seizures   Tremors    HPI: Jeremy Browning is a 62 y.o. 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  Patient apparently started having tremors since early in the morning on 4/2 typical for his focal seizures and had been twitching throughout the day.  This seizure did not generalize and he remained awake and alert. ED course and data review: BP 156/60 with otherwise normal vitals valproic acid therapeutic, creatinine at baseline mild anemia of 12.1, platelets 1 17,000,.  Valproic acid level mildly above therapeutic.  Labs otherwise unremarkable.EKG, personally reviewed showing sinus at 65 with LBBB.  CT head chronic large territory left MCA infarct. Patient was treated with Ativan with cessation of seizure.  He was loaded with Keppra.  Hospitalist consulted for admission.   Review of Systems: As mentioned in the history of present illness. All other systems reviewed and are negative.  Past Medical History:  Diagnosis Date   CHF (congestive heart failure)    Diabetes mellitus type II, non insulin dependent    Essential hypertension Raquel Sarna like   h/o Proteus pneumonia ([place]) 02/17/2021   Heart failure    Unknown details.  By report no cardiac surgery   Seizures    Severe hypoxic-ischemic encephalopathy    Sleep apnea    Stroke    Venous stasis dermatitis of left lower extremity    Past Surgical  History:  Procedure Laterality Date   HARDWARE REMOVAL Left 02/10/2022   Procedure: HARDWARE REMOVAL LEFT TIBIA;  Surgeon: Newt Minion, MD;  Location: [place];  Service: Orthopedics;  Laterality: Left;   I & D EXTREMITY Left 02/10/2022   Procedure: DEBRIDEMENT OF LEFT TIBIA;  Surgeon: Newt Minion, MD;  Location: [place];  Service: Orthopedics;  Laterality: Left;   LEFT HEART CATH AND CORONARY ANGIOGRAPHY N/A 12/25/2020   Procedure: LEFT HEART CATH AND CORONARY ANGIOGRAPHY;  Surgeon: Leonie Man, MD;  Location: [facility];  Service: Cardiovascular;  Laterality: N/A; (In setting of Acute Hypoxic and Hypercapnic Respiratory Failure with Flash Pulm Edema/Hypertensive Emergency) - Angiographically minimal CAD.  Moderately reduced LV function with EF of 35 to 45%. LV P-EDP 101/21 mmHg - 30 mmHg   LEFT HEART CATH AND CORONARY ANGIOGRAPHY  09/12/2018   [facility]): Ostial LAD ~20% otherwise normal coronary arteries. LVEDP 20 mmHg;- HTN Emergency related Type II MI>   PEG PLACEMENT N/A 01/11/2021   Procedure: PERCUTANEOUS ENDOSCOPIC GASTROSTOMY (PEG) PLACEMENT;  Surgeon: Lucilla Lame, MD;  Location: [facility];  Service: Endoscopy;  Laterality: N/A;   RIGHT HEART CATH N/A 12/25/2020   Procedure: RIGHT HEART CATH;  Surgeon: Leonie Man, MD;  Location: [place] [facility];  Service: Cardiovascular; R IJ: (Acute Hypoxic and Hypercapnic Respiratory Failure with Flash Pulm Edema/Hypertensive Emergency): CO-CI 6.54-2.82. Mild Secondary Pulmonary Hypertension (PCWP 30 mmHg.  PAP 47/13 mmHg with a mean PAP of 33 mmHg, RAP 22 mmHg in setting of AoP-MAP 97/59 mmHg -  71 mmHg,   TRACHEOSTOMY TUBE PLACEMENT N/A 01/12/2021   Procedure: TRACHEOSTOMY;  Surgeon: Carloyn Manner, MD;  Location: [facility];  Service: ENT;  Laterality: N/A;   TRANSTHORACIC ECHOCARDIOGRAM  09/11/2018   [facility]: Moderately reduced LVEF ~35%. 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 Mild-mod AI, Trivial MR?TR.  LVEDD 6.0 (reduced compared to 2017: EF ~40-45%, LVEDD 5.6   TRANSTHORACIC ECHOCARDIOGRAM  12/27/2020   [facility]: (In Setting of Acute Hypoxic and Hypercapnic Respiratory Failure with Flash Pulm Edema/Hypertensive Emergency)  EF 45-50%. Mildly reduced LVEF with global HK. Moderately dilated LV with Gr 1 DD - mild LA dilation. Normal RV size Fxn.  Normal [place]   TRANSTHORACIC ECHOCARDIOGRAM  03/07/2021   [facility]: EF improved to 60-65% with Nl LV Fxn & ~ normal diastolic fxn. Normal RV size & fxn. Mild MR & AI.  Normal LA size.   Unknown     Social History:  reports that he quit smoking about 4 years ago. His smoking use included cigarettes. He has a 3.50 pack-year smoking history. He has never used smokeless tobacco. He reports that he does not currently use alcohol. He reports that he does not currently use drugs.  No Known Allergies  Family History  Problem Relation Age of Onset   Asthma Mother    Hypertension Mother        LIkely Severe/Malignant   Kidney failure Mother        ESRD - HD   CVA Mother    Alcohol abuse Father    Depression Father    Asthma Sister    Hypertension Sister    Diabetes Mellitus II Sister    Asthma Brother    Hypertension Brother     Prior to Admission medications   Medication Sig Start Date End Date Taking? Authorizing Provider  apixaban (ELIQUIS) 5 MG TABS tablet Take 5 mg by mouth 2 (two) times daily.   Yes [provider]  aspirin EC 81 MG tablet Take 81 mg by mouth daily.   Yes [provider]  atorvastatin (LIPITOR) 80 MG tablet Take 80 mg by mouth daily.   Yes [provider]  Carboxymethylcellulose Sod PF 1 % GEL Apply in both eyes in the morning and at bedtime.   Yes [provider]  diazepam (VALIUM) 5 MG/ML injection Inject 1 ml intramuscularly every 5 minutes as needed for seizure. May repeat in 5 minutes if seizure is not resolved.   Yes [provider]  divalproex (DEPAKOTE) 250 MG DR tablet Take 3  tablets (750 mg total) by mouth 2 (two) times daily. 04/21/22  Yes Enzo Bi, MD  furosemide (LASIX) 20 MG tablet Take 40 mg by mouth daily.   Yes [provider]  levETIRAcetam (KEPPRA) 750 MG tablet Take 2 tablets (1,500 mg total) by mouth 2 (two) times daily. Patient taking differently: Take 750 mg by mouth daily. 03/13/22  Yes Patrecia Pour, MD  sertraline (ZOLOFT) 50 MG tablet Take 50 mg by mouth daily.   Yes [provider]  tamsulosin (FLOMAX) 0.4 MG CAPS capsule Take 0.4 mg by mouth daily.   Yes [provider]  carvedilol (COREG) 6.25 MG tablet Hold until followup with cardiology due to HR in 40's-50's. Patient not taking: Reported on 11/22/2022 04/21/22   Enzo Bi, MD  diclofenac Sodium (VOLTAREN) 1 % GEL Apply 2 g topically 4 (four) times daily. Patient not taking: Reported on 11/22/2022    [provider]  LORazepam (ATIVAN) 2  MG/ML injection every 8 (eight) hours. Inject 0.5 ml intramuscularly one time only related to epilepsy. Patient not taking: Reported on 11/22/2022    [provider]  losartan (COZAAR) 25 MG tablet Take 25 mg by mouth daily. Patient not taking: Reported on 11/22/2022    [provider]    Physical Exam: Vitals:   11/21/22 2202 11/21/22 2203 11/22/22 0015 11/22/22 0130  BP:  (!) 156/60 (!) 168/62 (!) 164/45  Pulse:  69 (!) 56 (!) 54  Resp:  17 14 16   Temp:  98.5 F (36.9 C)    TempSrc:  Oral    SpO2:  97% 96% 97%  Weight: 108 kg     Height: 5\' 7"  (1.702 m)      Physical Exam Vitals and nursing note reviewed.  Constitutional:      General: He is sleeping. He is not in acute distress. HENT:     Head: Normocephalic and atraumatic.  Cardiovascular:     Rate and Rhythm: Normal rate and regular rhythm.     Heart sounds: Normal heart sounds.  Pulmonary:     Effort: Pulmonary effort is normal.     Breath sounds: Normal breath sounds.  Abdominal:     Palpations: Abdomen is soft.     Tenderness: There is no  abdominal tenderness.  Neurological:     Mental Status: He is easily aroused. Mental status is at baseline.     Labs on Admission: I have personally reviewed following labs and imaging studies  CBC: Recent Labs  Lab 11/21/22 2207  WBC 5.0  HGB 12.1*  HCT 37.3*  MCV 86.3  PLT 123XX123*   Basic Metabolic Panel: Recent Labs  Lab 11/21/22 2207  NA 137  K 4.2  CL 102  CO2 25  GLUCOSE 96  BUN 28*  CREATININE 1.66*  CALCIUM 8.6*   GFR: Estimated Creatinine Clearance: 54.1 mL/min (A) (by C-G formula based on SCr of 1.66 mg/dL (H)). Liver Function Tests: No results for input(s): "AST", "ALT", "ALKPHOS", "BILITOT", "PROT", "ALBUMIN" in the last 168 hours. No results for input(s): "LIPASE", "AMYLASE" in the last 168 hours. No results for input(s): "AMMONIA" in the last 168 hours. Coagulation Profile: No results for input(s): "INR", "PROTIME" in the last 168 hours. Cardiac Enzymes: No results for input(s): "CKTOTAL", "CKMB", "CKMBINDEX", "TROPONINI" in the last 168 hours. BNP (last 3 results) No results for input(s): "PROBNP" in the last 8760 hours. HbA1C: No results for input(s): "HGBA1C" in the last 72 hours. CBG: No results for input(s): "GLUCAP" in the last 168 hours. Lipid Profile: No results for input(s): "CHOL", "HDL", "LDLCALC", "TRIG", "CHOLHDL", "LDLDIRECT" in the last 72 hours. Thyroid Function Tests: No results for input(s): "TSH", "T4TOTAL", "FREET4", "T3FREE", "THYROIDAB" in the last 72 hours. Anemia Panel: No results for input(s): "VITAMINB12", "FOLATE", "FERRITIN", "TIBC", "IRON", "RETICCTPCT" in the last 72 hours. Urine analysis:    Component Value Date/Time   COLORURINE YELLOW (A) 03/08/2022 0117   APPEARANCEUR CLEAR (A) 03/08/2022 0117   LABSPEC 1.011 03/08/2022 0117   PHURINE 6.0 03/08/2022 0117   GLUCOSEU NEGATIVE 03/08/2022 0117   HGBUR NEGATIVE 03/08/2022 0117   BILIRUBINUR NEGATIVE 03/08/2022 0117   KETONESUR NEGATIVE 03/08/2022 0117   PROTEINUR  100 (A) 03/08/2022 0117   NITRITE NEGATIVE 03/08/2022 0117   LEUKOCYTESUR NEGATIVE 03/08/2022 0117    Radiological Exams on Admission: CT Head Wo Contrast  Result Date: 11/21/2022 CLINICAL DATA:  Seizure. History of epilepsy. No reported missed dose of medications. Patient continuation twitching  in triage. Unable to hold still. EXAM: CT HEAD WITHOUT CONTRAST TECHNIQUE: Contiguous axial images were obtained from the base of the skull through the vertex without intravenous contrast. RADIATION DOSE REDUCTION: This exam was performed according to the departmental dose-optimization program which includes automated exposure control, adjustment of the mA and/or kV according to patient size and/or use of iterative reconstruction technique. COMPARISON:  CT head 04/20/2022 FINDINGS: Exam is degraded by motion despite repeat scanning. Brain: Chronic right MCA territory infarct. Additional chronic infarct left occipital/parietal lobe. No CT evidence of acute infarct. No definite intracranial hemorrhage. Ventricular caliber is unchanged. Vascular: No hyperdense vessel or unexpected calcification. Skull: No definite acute fracture. Sinuses/Orbits: Mucosal thickening right maxillary sinus. Leftward gaze. Other: None. IMPRESSION: 1. Motion degraded study. No definite acute intracranial abnormality. 2. Large territory chronic right MCA infarct. Chronic infarct left occipital parietal lobe. Electronically Signed   By: Placido Sou M.D.   On: 11/21/2022 22:39     Data Reviewed: Relevant notes from primary care and specialist visits, past discharge summaries as available in EHR, including Care Everywhere. Prior diagnostic testing as pertinent to current admission diagnoses Updated medications and problem lists for reconciliation ED course, including vitals, labs, imaging, treatment and response to treatment Triage notes, nursing and pharmacy notes and ED provider's notes Notable results as noted in  HPI   Assessment and Plan:  Breakthrough seizure ([place]) Seizure disorder as sequela of cerebrovascular accident ([place]) Continue Keppra 1000 mg IV twice daily Continue valproate Ativan as needed breakthrough Neurologic checks Seizure, fall and aspiration precautions Neurology consult for any additional recommendations   Sinus bradycardia Following admission, patient persistently bradycardic to the low to mid 40s    11/22/2022    5:39 AM 11/22/2022    5:30 AM 11/22/2022    1:30 AM  Vitals with BMI  Systolic [id number]  Diastolic 49 49 45  Pulse 44 44 54   Chart review reveals that this has been documented since September 2023 when heart rate was in the 40s to 83s Last cardiology visit December 2023, beta-blockers not restarted.  13-month follow-up given Continue to avoid AV nodal blockers Will order echo Cardiology consult . Chronic anticoagulation Patient on Eliquis, suspect related to history of stroke.   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  Critical care was necessary to treat or prevent imminent or life-threatening deterioration.  Critical  care was time spent personally by me on the following activities: development of treatment plan with patient and/or surrogate as well as nursing, discussions with consultants, evaluation of patient's response to treatment, examination of patient, obtaining history from patient or surrogate, ordering and performing treatments and interventions, ordering and review of laboratory studies, ordering and review of  radiographic studies, pulse oximetry and re-evaluation of patient's condition.    DVT prophylaxis: eliquis  Consults: Neurology, Dr. Malen Gauze, cardiology, Dr. Saralyn Pilar  Advance Care Planning:   Code Status: Prior   Family Communication: none  Disposition Plan: Back to previous home environment  Severity of Illness: The appropriate patient status for this patient is OBSERVATION. Observation status is judged to be reasonable and necessary in order to provide the required intensity of service to ensure the patient's safety. The patient's presenting symptoms, physical exam findings, and initial radiographic and laboratory data in the context of their medical condition is felt to place them at decreased risk for further clinical deterioration. Furthermore, it is anticipated that the patient will be medically stable for discharge from the hospital within 2 midnights of admission.   Author: Athena Masse, MD 11/22/2022 2:12 AM  For on call review [email or web address].

## 2022-11-22 NOTE — Hospital Course (Signed)
Jeremy Browning is a 62 y.o. 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 Discussed with Dr. Rory Percy, patient current regimen for seizure is adequate.  No need adjustment. Patient also had a significant sinus bradycardia, seen by cardiology.  Beta-blocker was on hold.

## 2022-11-22 NOTE — ED Notes (Signed)
Assumed care from [place]. Pt resting comfortably in bed at this time. Pt denies any current needs or questions. Call light with in reach.

## 2022-11-22 NOTE — Progress Notes (Signed)
Noted consult for IV. Secure chat sent to nurse to notify that there is not currently an IV nurse on site.

## 2022-11-22 NOTE — ED Notes (Signed)
Secure chat message sent to dr. Damita Dunnings to notify hr in 55s. Per previous RN pt has had SB in 32s.

## 2022-11-22 NOTE — ED Notes (Signed)
Report to alexis, rn

## 2022-11-22 NOTE — Consult Note (Signed)
[place] NOTE       Patient ID: Jeremy Browning MRN: [id number] DOB/AGE: 1960-09-24 62 y.o.  Admit date: 11/22/2022 Referring Physician Dr. Judd Gaudier  Primary Physician  Primary Cardiologist Dr. Clayborn Bigness Reason for Consultation sinus bradycardia   HPI: Jeremy Browning is a 57yoM with a PMH of NICM/HF recovered EF (50-55%, mild AI, trivial MR 02/2022, prev 40-45% 10/2021), normal coronaries by [facility] 07/2021), cardiac arrest/VT requiring cardioversion, chronic LBBB, seizure disorder, hx large MCA territory infarct, HTN, developmental disability who presented to [facility] ED from a skilled nursing facility with a breakthrough seizure.  Cardiology is consulted because of sinus bradycardia noted on telemetry.  The patient was previously followed by [facility] cardiology and [facility] but has recently establish care with Dr. Clayborn Bigness in December 2023.  He notably had a long hospitalization at [facility] on 01/07/2021 with respiratory failure requiring prolonged intubation and eventual tracheostomy and PEG placement complicated by cardiac arrest, VT requiring cardioversion, and EKG changes with C/F STEMI for which she underwent LHC that revealed normal coronaries.  EF during this admission was reduced at 35-40% and right heart cath with mild secondary pulmonary hypertension.  More recent echo revealed recovered EF of 50-55% July 2023.  In 2023 he had approximately 4 admissions for breakthrough seizures, most recently 03/2022.  He presents to [facility] ED [facility] focal seizures in his upper extremities, reportedly ongoing for hours.  He reported compliance with his home Keppra and seizure activity/tremors ceased after receiving Ativan in triage.  Initial vitals were for a blood pressure of 156/61 heart rate was documented in the 50s and 60s.  He is comfortable on room air initial EKG showed sinus rhythm with rate of 65 bpm, redemonstrated his chronic LBBB and also occasional PVCs.  Notably the patient has  had EKGs demonstrating heart rate lower around 48 bpm in July 2023.    At my time of evaluation the patient is laying at incline hospital bed in the emergency department without family at bedside.  He awakens eventually to voice but keeps his eyes closed during interview.  He tells me came to the hospital because of seizure where his arms were shaking, which is rather typical for him.  He thinks he had some lightheadedness while he had a seizure and had a headache afterwards.  He denies any chest pain or shortness of breath, but his legs have been a little swollen lately he cannot tell me any of his home medications, nor recount any of his prior cardiac history.  Review of systems complete and found to be negative unless listed above     Past Medical History:  Diagnosis Date   CHF (congestive heart failure)    Diabetes mellitus type II, non insulin dependent    Essential hypertension Raquel Sarna like   h/o Proteus pneumonia ([place]) 02/17/2021   Heart failure    Unknown details.  By report no cardiac surgery   Seizures    Severe hypoxic-ischemic encephalopathy    Sleep apnea    Stroke    Venous stasis dermatitis of left lower extremity     Past Surgical History:  Procedure Laterality Date   HARDWARE REMOVAL Left 02/10/2022   Procedure: HARDWARE REMOVAL LEFT TIBIA;  Surgeon: Newt Minion, MD;  Location: [place];  Service: Orthopedics;  Laterality: Left;   I & D EXTREMITY Left 02/10/2022   Procedure: DEBRIDEMENT OF LEFT TIBIA;  Surgeon: Newt Minion, MD;  Location: [place];  Service: Orthopedics;  Laterality: Left;  LEFT HEART CATH AND CORONARY ANGIOGRAPHY N/A 12/25/2020   Procedure: LEFT HEART CATH AND CORONARY ANGIOGRAPHY;  Surgeon: Leonie Man, MD;  Location: [place] [facility];  Service: Cardiovascular;  Laterality: N/A; (In setting of Acute Hypoxic and Hypercapnic Respiratory Failure with Flash Pulm Edema/Hypertensive Emergency) - Angiographically minimal CAD.  Moderately reduced LV  function with EF of 35 to 45%. LV P-EDP 101/21 mmHg - 30 mmHg   LEFT HEART CATH AND CORONARY ANGIOGRAPHY  09/12/2018   [facility]): Ostial LAD ~20% otherwise normal coronary arteries. LVEDP 20 mmHg;- HTN Emergency related Type II MI>   PEG PLACEMENT N/A 01/11/2021   Procedure: PERCUTANEOUS ENDOSCOPIC GASTROSTOMY (PEG) PLACEMENT;  Surgeon: Lucilla Lame, MD;  Location: [facility];  Service: Endoscopy;  Laterality: N/A;   RIGHT HEART CATH N/A 12/25/2020   Procedure: RIGHT HEART CATH;  Surgeon: Leonie Man, MD;  Location: [place] [facility];  Service: Cardiovascular; R IJ: (Acute Hypoxic and Hypercapnic Respiratory Failure with Flash Pulm Edema/Hypertensive Emergency): CO-CI 6.54-2.82. Mild Secondary Pulmonary Hypertension (PCWP 30 mmHg.  PAP 47/13 mmHg with a mean PAP of 33 mmHg, RAP 22 mmHg in setting of AoP-MAP 97/59 mmHg - 71 mmHg,   TRACHEOSTOMY TUBE PLACEMENT N/A 01/12/2021   Procedure: TRACHEOSTOMY;  Surgeon: Carloyn Manner, MD;  Location: [facility];  Service: ENT;  Laterality: N/A;   TRANSTHORACIC ECHOCARDIOGRAM  09/11/2018   [facility]: Moderately reduced LVEF ~35%. 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 Mild-mod AI, Trivial MR?TR. LVEDD 6.0 (reduced compared to 2017: EF ~40-45%, LVEDD 5.6   TRANSTHORACIC ECHOCARDIOGRAM  12/27/2020   [facility]: (In Setting of Acute Hypoxic and Hypercapnic Respiratory Failure with Flash Pulm Edema/Hypertensive Emergency)  EF 45-50%. Mildly reduced LVEF with global HK. Moderately dilated LV with Gr 1 DD - mild LA dilation. Normal RV size Fxn.  Normal [place]   TRANSTHORACIC ECHOCARDIOGRAM  03/07/2021   [facility]: EF improved to 60-65% with Nl LV Fxn & ~ normal diastolic fxn. Normal RV size & fxn. Mild MR & AI.  Normal LA size.   Unknown      (Not in a hospital admission)  Social History   Socioeconomic History   Marital status: Widowed    Spouse name: Not on file   Number of children: 0   Years of education: 3   Highest education level: Not on  file  Occupational History   Not on file  Tobacco Use   Smoking status: Former    Packs/day: 0.50    Years: 7.00    Additional pack years: 0.00    Total pack years: 3.50    Types: Cigarettes    Quit date: 09/11/2018    Years since quitting: 4.2   Smokeless tobacco: Never  Vaping Use   Vaping Use: Never used  Substance and Sexual Activity   Alcohol use: Not Currently   Drug use: Not Currently   Sexual activity: Not Currently  Other Topics Concern   Not on file  Social History Narrative   Widowed/Single. Unemployed -- Brother provides financial support.    He lives with his brother and the brother's family.  (Sister-In-Law along with Niece &Nephew) -- has "his room".    Recently moved here to the [facility] area from ~[place] (prior to May [facility] Admission).  Has never been established here at [facility].      ->  According to his Brother, he likely related to cognitive difficulties (only 3rd Grade Education - unable to read/write), he does not have a job,  has difficulty affording medications and other therapies.      Previously worked Statistician Arts administrator for [employer];      Prior to moving to [place] - was going to [facility]) in [place] -- was able get most of his medications from there.      Previously Followed by:    Rise Patience, NP -                            [place] [place], [place].    o Dr. Loyal Buba  & Vonzell Schlatter, NP  ([facility])    o Arlie Solomons, MD - [facility],      o Had Telemedicine Visit with Laurice Record. Daubert, MD [facility])   Social Determinants of Health   Financial Resource Strain: Not on file  Food Insecurity: Not on file  Transportation Needs: Not on file  Physical Activity: Not on file  Stress: Not on file  Social Connections: Not on file  Intimate Partner Violence: Not on file    Family History  Problem Relation Age of Onset   Asthma Mother     Hypertension Mother        LIkely Severe/Malignant   Kidney failure Mother        ESRD - HD   CVA Mother    Alcohol abuse Father    Depression Father    Asthma Sister    Hypertension Sister    Diabetes Mellitus II Sister    Asthma Brother    Hypertension Brother      No intake or output data in the 24 hours ending 11/22/22 0756  Vitals:   11/22/22 0130 11/22/22 0530 11/22/22 0539 11/22/22 0600  BP: (!) 164/45 (!) 157/49 (!) 157/49 (!) 142/40  Pulse: (!) 54 (!) 44 (!) 44 (!) 44  Resp: 16 14 14 15   Temp:   (!) 97.1 F (36.2 C)   TempSrc:   Axillary   SpO2: 97%  100%   Weight:      Height:        PHYSICAL EXAM General: Chronically ill-appearing male,  in no acute distress.  Laying inclined in bed.  Awakens easily to voice but keeps eyes closed and turned away from me during entirety of the encounter. HEENT:  Normocephalic and atraumatic. Neck:  No JVD.  Lungs: Normal respiratory effort on room air. Clear bilaterally to auscultation. No wheezes, crackles, rhonchi.  Heart: Bradycardic but regular. Normal S1 and S2 without gallops or murmurs.  Abdomen: Non-distended appearing.  Msk: Generalized weakness Extremities: Warm and well perfused. No clubbing, cyanosis.  Trace nonpitting bilateral lower extremity edema.  Neuro: Alert and oriented to self, place, and situation Psych:  Answers simple questions appropriately but is a rather limited historian.   Labs: Basic Metabolic Panel: Recent Labs    11/21/22 2207  NA 137  K 4.2  CL 102  CO2 25  GLUCOSE 96  BUN 28*  CREATININE 1.66*  CALCIUM 8.6*   Liver Function Tests: No results for input(s): "AST", "ALT", "ALKPHOS", "BILITOT", "PROT", "ALBUMIN" in the last 72 hours. No results for input(s): "LIPASE", "AMYLASE" in the last 72 hours. CBC: Recent Labs    11/21/22 2207  WBC 5.0  HGB 12.1*  HCT 37.3*  MCV 86.3  PLT 117*   Cardiac Enzymes: No results for input(s): "CKTOTAL", "CKMB", "CKMBINDEX", "TROPONINIHS" in  the last  72 hours. BNP: No results for input(s): "BNP" in the last 72 hours. D-Dimer: No results for input(s): "DDIMER" in the last 72 hours. Hemoglobin A1C: No results for input(s): "HGBA1C" in the last 72 hours. Fasting Lipid Panel: No results for input(s): "CHOL", "HDL", "LDLCALC", "TRIG", "CHOLHDL", "LDLDIRECT" in the last 72 hours. Thyroid Function Tests: No results for input(s): "TSH", "T4TOTAL", "T3FREE", "THYROIDAB" in the last 72 hours.  Invalid input(s): "FREET3" Anemia Panel: No results for input(s): "VITAMINB12", "FOLATE", "FERRITIN", "TIBC", "IRON", "RETICCTPCT" in the last 72 hours.   Radiology: CT Head Wo Contrast  Result Date: 11/21/2022 CLINICAL DATA:  Seizure. History of epilepsy. No reported missed dose of medications. Patient continuation twitching in triage. Unable to hold still. EXAM: CT HEAD WITHOUT CONTRAST TECHNIQUE: Contiguous axial images were obtained from the base of the skull through the vertex without intravenous contrast. RADIATION DOSE REDUCTION: This exam was performed according to the departmental dose-optimization program which includes automated exposure control, adjustment of the mA and/or kV according to patient size and/or use of iterative reconstruction technique. COMPARISON:  CT head 04/20/2022 FINDINGS: Exam is degraded by motion despite repeat scanning. Brain: Chronic right MCA territory infarct. Additional chronic infarct left occipital/parietal lobe. No CT evidence of acute infarct. No definite intracranial hemorrhage. Ventricular caliber is unchanged. Vascular: No hyperdense vessel or unexpected calcification. Skull: No definite acute fracture. Sinuses/Orbits: Mucosal thickening right maxillary sinus. Leftward gaze. Other: None. IMPRESSION: 1. Motion degraded study. No definite acute intracranial abnormality. 2. Large territory chronic right MCA infarct. Chronic infarct left occipital parietal lobe. Electronically Signed   By: Placido Sou M.D.   On:  11/21/2022 22:39    [facility] 12/25/2020 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 LV End Diastolic Pressure and Pulmonary Capillary Wedge Pressure are both severely elevated. There is moderate left ventricular systolic dysfunction. The left ventricular ejection fraction is 35-45% by visual estimate. Respiratory acidosis with pH of 7.0 and PCO2 of 93.   POST-OPERATIVE DIAGNOSIS:  Angiographically normal coronary arteries with no obvious culprit lesion to explain heart failure this level of hypoxia or hypercapnia. Acute combined systolic and diastolic heart failure: EF of roughly 40 to 45% with global hypokinesis: PCWP and LVEDP of 30 mmHg.   Cardiac Output and Index relatively normal: CO 6.54, CI 2.82 (mildly reduced). AoP-MAP 97/59 mmHg - 71 mmHg; LVP-EDP 101/21-30 mmHg.;  PCWP 30 mmHg, PAP 47/17 mmHg-mean 33 mmHg.  RAP mean 22 mmHg. Secondary Pulmonary Hypertension     RECOMMENDATIONS Patient needs ICU care with aggressive pulmonary management to improve oxygenation and ventilation. The presence of acute renal failure and lactic acidosis precludes his candidacy for [facility] per Dr. Haroldine Laws.  He recommends aggressive pulmonary management and diuresis.  May require milrinone. Agree with Levophed for blood pressure assistance.   Major concern is ocation of care-he likely will need to be transferred to higher level care center.  Currently working on bed availability at either [facility] or [facility].     Glenetta Hew, MD    ECHO  1. Left ventricular ejection fraction, by estimation, is 50 to 55%. The  left ventricle has low normal function. There is mild concentric left  ventricular hypertrophy.   2. Trivial mitral valve regurgitation.   3. The aortic valve is tricuspid. There is mild calcification of the  aortic valve. There is mild thickening of the aortic valve. Aortic valve  regurgitation is mild.   4. There is normal pulmonary artery systolic  pressure.   5. The inferior  vena cava is dilated in size with <50% respiratory  variability, suggesting right atrial pressure of 15 mmHg.   TELEMETRY reviewed by me (LT) 11/22/2022 : Sinus bradycardia average rate 44 bpm with increase to the 50s-60s during interview  EKG reviewed by me: Sinus rhythm rate 65, chronic LBBB  Data reviewed by me (LT) 11/22/2022: Notes from 12/2020 hospitalization last outpatient cardiology note, ED note, admission H&P last 24h vitals tele labs imaging I/O   Principal Problem:   Breakthrough seizure Active Problems:   HFrEF (heart failure with reduced ejection fraction)   Essential hypertension   OSA (obstructive sleep apnea)   COPD (chronic obstructive pulmonary disease)   Stage 3a chronic kidney disease   Seizure disorder as sequela of cerebrovascular accident   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  Cardiology is consulted because of sinus bradycardia noted on telemetry.  # breakthrough seizure # hx CVA  With chronic residual hemiparesis, presents with tremors despite reported compliance with antiepileptics, terminating with ativan -management per primary, neurology.  # asymptomatic sinus bradycardia # hx Mobitz 2 AV block  Presents in sinus bradycardia with HR average 44bpm overnight while resting in bed, during interview heart rate comes up to 50s-60s.  Reviewed cardiology notes and EKGs from hospitalization in May 2022 in the  emergency department this admission.  Patient had documented history Mobitz 2 AV block that was felt to be metabolic in etiology during that hospitalization. The patient cannot tell me any medications he takes daily, but coreg 6.25mg  BID is listed. No evidence on telemetry high-grade AV block or long sinus pauses so far this current admission. -Recommend continuous monitoring on telemetry while inpatient -Monitor and replenish electrolytes.  K >4, mag >2 -stop coreg, no CCB -No indication for temporary transvenous pacemaker or permanent pacemaker at this time  # Hx cardiopulmonary arrest/VT 12/2020 # HF with recovered EF (50-55%, 02/2022) # NICM History of cardiopulmonary arrest/VT requiring defibrillation during prolonged admission in May 2022, requiring trach and PEG tube placement with initial concern for anoxic brain injury, but eventually had some neurologic recovery.  Normal coronaries [facility] 12/2020.  The patient has rather limited insight into his cardiac history. Clinically somewhat volume up peripheral edema in his lower legs on exam today. -continue home Lasix 40 mg daily. -stop coreg for now with baseline bradycardia -consider addition of ACEi/ARB, MRA, SGLT1i and renal function and BP allow  # CKD 3  Around baseline.  BUN/creatinine 28/1.66 and GFR 46  This patient's plan of care was discussed and created with Dr. Saralyn Pilar and he is in agreement.  Signed: Tristan Schroeder , PA-C 11/22/2022, 7:56 AM [facility]

## 2022-11-22 NOTE — ED Notes (Signed)
2 attempts made at IV placement without success. IV team consult placed

## 2022-11-22 NOTE — ED Notes (Signed)
Pt cleaned of incontinence. Male purewick replaced. Pt denies any additional needs at this time.

## 2022-11-22 NOTE — ED Notes (Signed)
Another RN from ED made attempt at IV placement without success.

## 2022-11-22 NOTE — Assessment & Plan Note (Addendum)
Following admission, patient persistently bradycardic to the low to mid 40s Chart review reveals that this has been documented since September 2023 when heart rate was in the 40s to 71s Last cardiology visit December 2023, beta-blockers not restarted.  64-month follow-up given Continue to avoid AV nodal blockers Will order echo Cardiology consult

## 2022-11-22 NOTE — ED Notes (Signed)
Patient resting in bed free from sign of distress. Breathing unlabored with symmetric chest rise and fall. Bed low and locked with side rails raised x2. Call bell in reach and monitor in place. Seizure pads remain in place.

## 2022-11-22 NOTE — ED Notes (Addendum)
Pt requesting softer foods due to having no teeth to chew. RN notified MD Roosevelt Locks as well for pt low diastolic pressures and MAPs.

## 2022-11-23 DIAGNOSIS — I69398 Other sequelae of cerebral infarction: Secondary | ICD-10-CM

## 2022-11-23 DIAGNOSIS — G40909 Epilepsy, unspecified, not intractable, without status epilepticus: Secondary | ICD-10-CM

## 2022-11-23 DIAGNOSIS — R001 Bradycardia, unspecified: Secondary | ICD-10-CM | POA: Diagnosis not present

## 2022-11-23 DIAGNOSIS — G40919 Epilepsy, unspecified, intractable, without status epilepticus: Secondary | ICD-10-CM | POA: Diagnosis not present

## 2022-11-23 MED ORDER — LORAZEPAM 2 MG/ML IJ SOLN: 2.0000 mg | Freq: Three times a day (TID) | INTRAMUSCULAR | 0 refills | Status: DC | PRN

## 2022-11-23 NOTE — Progress Notes (Signed)
Patient to return to [facility] today, Jeremy Browning at [facility] aware. Patient will transport via [place], [place], [place], [place]

## 2022-11-23 NOTE — Discharge Summary (Signed)
Physician Discharge Summary   Patient: Jeremy Browning MRN: [id number] DOB: December 11, 1960  Admit date:     11/22/2022  Discharge date: 11/23/22  Discharge Physician: Sharen Hones   PCP: Jeremy Roger, MD   Recommendations at discharge:   Follow-up with PCP in 1 week. Follow-up with neurology in 1 month.  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 *  Hospital Course: Jeremy Browning is a 62 y.o. 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 Discussed with Dr. Rory Browning, patient current regimen for seizure is adequate.  No need adjustment. Patient also had a significant sinus bradycardia, seen by cardiology.  Beta-blocker discontinued.  Discussed with cardiology, no intervention is needed for bradycardia.  Assessment and Plan: Breakthrough seizure Jeremy Browning) Seizure disorder as sequela of cerebrovascular accident Jeremy Browning) Discussed with neurology, no need to adjust seizure medicines. Patient condition had improved, mental status improved.  Will transfer patient back to SNF.   Sinus bradycardia HFrEF (heart failure with reduced ejection fraction) (Jeremy Browning) Patient had a significant bradycardia,  mainly during the sleep.  Discussed with cardiology, intervention is stopped beta-blocker, no need for pacemaker.     Anemia Thrombocytopenia (HCC) Condition is relatively stable.   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     Stage 3a chronic kidney disease (HCC) Renal function at baseline   COPD (chronic obstructive pulmonary disease) (HCC) No exacerbation.     Essential hypertension Continue losartan, Coreg discontinued.   Morbid obesity BMI 37.29 with multiple comorbidities.        Consultants: cardiology Procedures performed: None  Disposition: Skilled nursing facility Diet recommendation:  Discharge Diet Orders (From admission, onward)     Start     Ordered   11/23/22 0000  Diet - low sodium heart healthy        11/23/22 0958           Cardiac diet DISCHARGE MEDICATION: Allergies as of 11/23/2022   No Known Allergies      Medication List     STOP taking these medications    carvedilol 6.25 MG tablet Commonly known as: COREG   diazepam 5 MG/ML injection Commonly known as: VALIUM       TAKE these medications    apixaban 5 MG Tabs tablet Commonly known as: ELIQUIS Take 5 mg by mouth 2 (two) times daily.   aspirin EC 81 MG tablet Take 81 mg by mouth daily.   atorvastatin 80 MG tablet Commonly known as: LIPITOR Take 80 mg by mouth daily.   Carboxymethylcellulose Sod PF 1 % Gel Apply in both eyes in the morning and at bedtime.   diclofenac Sodium 1 % Gel Commonly known as: VOLTAREN Apply 2 g  topically 4 (four) times daily.   divalproex 250 MG DR tablet Commonly known as: Depakote Take 3 tablets (750 mg total) by mouth 2 (two) times daily.   furosemide 20 MG tablet Commonly known as: LASIX Take 40 mg by mouth daily.   levETIRAcetam 750 MG tablet Commonly known as: KEPPRA Take 2 tablets (1,500 mg total) by mouth 2 (two) times daily. What  changed:  how much to take when to take this   LORazepam 2 MG/ML injection Commonly known as: ATIVAN Inject 1 mL (2 mg total) into the muscle every 8 (eight) hours as needed for seizure. Inject 0.5 ml intramuscularly one time only related to epilepsy. What changed:  how much to take how to take this when to take this reasons to take this   losartan 25 MG tablet Commonly known as: COZAAR Take 25 mg by mouth daily.   sertraline 50 MG tablet Commonly known as: ZOLOFT Take 50 mg by mouth daily.   tamsulosin 0.4 MG Caps capsule Commonly known as: FLOMAX Take 0.4 mg by mouth daily.        Follow-up Information     Jeremy Roger, MD Follow up in 1 week(s).   Specialty: Internal Medicine Contact information: [address] [place] [address] [phone]         [facility] Follow up in 1 month(s).   Contact information: [place] [phone]               Discharge Exam: [name] Weights   11/21/22 2202  Weight: 108 kg   General exam: Appears calm and comfortable  Respiratory system: Clear to auscultation. Respiratory effort normal. Cardiovascular system: Regular and bradycardic. No JVD, murmurs, rubs, gallops or clicks. No pedal edema. Gastrointestinal system: Abdomen is nondistended, soft and nontender. No organomegaly or masses felt. Normal bowel sounds heard. Central nervous system: Alert and oriented x2. No focal neurological deficits. Extremities: Symmetric 5 x 5 power. Skin: No rashes, lesions or ulcers Psychiatry: Judgement and insight appear normal. Mood & affect appropriate.    Condition at discharge: fair  The results of significant diagnostics from this hospitalization (including imaging, microbiology, ancillary and laboratory) are listed below for reference.   Imaging Studies: ECHOCARDIOGRAM COMPLETE  Result Date: 11/22/2022    ECHOCARDIOGRAM REPORT   Patient  Name:   Jeremy Browning Date of Exam: 11/22/2022 Medical Rec #:  [id number]      Height:       67.0 in Accession #:    [id number]     Weight:       238.1 lb Date of Birth:  Dec 26, 1960       BSA:          2.178 m Patient Age:    78 years       BP:           142/40 mmHg Patient Gender: M              HR:           44 bpm. Exam Location:  [facility] Procedure: 2D Echo, Cardiac Doppler and Color Doppler Indications:     CHF-acute diastolic [id number]  History:         Patient has prior history of Echocardiogram examinations, most                  recent 03/09/2021. CHF; Risk Factors:Diabetes, Hypertension and  Sleep Apnea.  Sonographer:     Sherrie Sport Referring Phys:  [id number] Athena Masse Diagnosing Phys: Isaias Cowman MD IMPRESSIONS  1. Left ventricular ejection fraction, by estimation, is 55 to 60%. The left ventricle has normal function. The left ventricle has no regional wall motion abnormalities. Left ventricular diastolic parameters are consistent with Grade I diastolic dysfunction (impaired relaxation).  2. Right ventricular systolic function is normal. The right ventricular size is normal.  3. The mitral valve is normal in structure. Mild mitral valve regurgitation. No evidence of mitral stenosis.  4. The aortic valve is normal in structure. Aortic valve regurgitation is mild. No aortic stenosis is present.  5. The inferior vena cava is normal in size with greater than 50% respiratory variability, suggesting right atrial pressure of 3 mmHg. FINDINGS  Left Ventricle: Left ventricular ejection fraction, by estimation, is 55 to 60%. The left ventricle has normal function. The left ventricle has no regional wall motion abnormalities. The left ventricular internal cavity size was normal in size. There is  no left ventricular hypertrophy. Left ventricular diastolic parameters are consistent with Grade I diastolic dysfunction (impaired relaxation). Right Ventricle: The right ventricular size is normal. No  increase in right ventricular wall thickness. Right ventricular systolic function is normal. Left Atrium: Left atrial size was normal in size. Right Atrium: Right atrial size was normal in size. Pericardium: There is no evidence of pericardial effusion. Mitral Valve: The mitral valve is normal in structure. Mild mitral valve regurgitation. No evidence of mitral valve stenosis. MV peak gradient, 2.2 mmHg. The mean mitral valve gradient is 1.0 mmHg. Tricuspid Valve: The tricuspid valve is normal in structure. Tricuspid valve regurgitation is mild . No evidence of tricuspid stenosis. Aortic Valve: The aortic valve is normal in structure. Aortic valve regurgitation is mild. No aortic stenosis is present. Aortic valve mean gradient measures 4.0 mmHg. Aortic valve peak gradient measures 7.7 mmHg. Aortic valve area, by VTI measures 2.08 cm. Pulmonic Valve: The pulmonic valve was normal in structure. Pulmonic valve regurgitation is not visualized. No evidence of pulmonic stenosis. Aorta: The aortic root is normal in size and structure. Venous: The inferior vena cava is normal in size with greater than 50% respiratory variability, suggesting right atrial pressure of 3 mmHg. IAS/Shunts: No atrial level shunt detected by color flow Doppler.  LEFT VENTRICLE PLAX 2D LVIDd:         5.50 cm   Diastology LVIDs:         3.90 cm   LV e' medial:    6.09 cm/s LV PW:         1.30 cm   LV E/e' medial:  9.9 LV IVS:        1.20 cm   LV e' lateral:   7.62 cm/s LVOT diam:     2.00 cm   LV E/e' lateral: 7.9 LV SV:         55 LV SV Index:   25 LVOT Area:     3.14 cm  RIGHT VENTRICLE RV Basal diam:  3.40 cm RV Mid diam:    2.80 cm RV S prime:     12.00 cm/s TAPSE (M-mode): 2.3 cm LEFT ATRIUM             Index        RIGHT ATRIUM           Index LA diam:        2.90 cm 1.33 cm/m   RA Area:  12.80 cm LA Vol (A2C):   50.5 ml 23.19 ml/m  RA Volume:   25.20 ml  11.57 ml/m LA Vol (A4C):   49.4 ml 22.68 ml/m LA Biplane Vol: 52.0 ml 23.88  ml/m  AORTIC VALVE AV Area (Vmax):    2.09 cm AV Area (Vmean):   2.17 cm AV Area (VTI):     2.08 cm AV Vmax:           139.00 cm/s AV Vmean:          92.600 cm/s AV VTI:            0.266 m AV Peak Grad:      7.7 mmHg AV Mean Grad:      4.0 mmHg LVOT Vmax:         92.60 cm/s LVOT Vmean:        64.100 cm/s LVOT VTI:          0.176 m LVOT/AV VTI ratio: 0.66  AORTA Ao Root diam: 3.00 cm MITRAL VALVE               TRICUSPID VALVE MV Area (PHT): 2.11 cm    TR Peak grad:   16.6 mmHg MV Area VTI:   1.47 cm    TR Vmax:        204.00 cm/s MV Peak grad:  2.2 mmHg MV Mean grad:  1.0 mmHg    SHUNTS MV Vmax:       0.75 m/s    Systemic VTI:  0.18 m MV Vmean:      42.3 cm/s   Systemic Diam: 2.00 cm MV Decel Time: 359 msec MV E velocity: 60.30 cm/s MV A velocity: 71.30 cm/s MV E/A ratio:  0.85 Isaias Cowman MD Electronically signed by Isaias Cowman MD Signature Date/Time: 11/22/2022/1:26:47 PM    Final    CT Head Wo Contrast  Result Date: 11/21/2022 CLINICAL DATA:  Seizure. History of epilepsy. No reported missed dose of medications. Patient continuation twitching in triage. Unable to hold still. EXAM: CT HEAD WITHOUT CONTRAST TECHNIQUE: Contiguous axial images were obtained from the base of the skull through the vertex without intravenous contrast. RADIATION DOSE REDUCTION: This exam was performed according to the departmental dose-optimization program which includes automated exposure control, adjustment of the mA and/or kV according to patient size and/or use of iterative reconstruction technique. COMPARISON:  CT head 04/20/2022 FINDINGS: Exam is degraded by motion despite repeat scanning. Brain: Chronic right MCA territory infarct. Additional chronic infarct left occipital/parietal lobe. No CT evidence of acute infarct. No definite intracranial hemorrhage. Ventricular caliber is unchanged. Vascular: No hyperdense vessel or unexpected calcification. Skull: No definite acute fracture. Sinuses/Orbits: Mucosal  thickening right maxillary sinus. Leftward gaze. Other: None. IMPRESSION: 1. Motion degraded study. No definite acute intracranial abnormality. 2. Large territory chronic right MCA infarct. Chronic infarct left occipital parietal lobe. Electronically Signed   By: Placido Sou M.D.   On: 11/21/2022 22:39    Microbiology: Results for orders placed or performed during the hospital encounter of 03/08/22  MRSA Next Gen by PCR, Nasal     Status: None   Collection Time: 03/08/22  5:21 PM   Specimen: Nasal Mucosa; Nasal Swab  Result Value Ref Range Status   MRSA by PCR Next Gen NOT DETECTED NOT DETECTED Final    Comment: (NOTE) The GeneXpert MRSA Assay (FDA approved for NASAL specimens only), is one component of a comprehensive MRSA colonization surveillance program. It is not intended to diagnose MRSA infection nor  to guide or monitor treatment for MRSA infections. Test performance is not FDA approved in patients less than 67 years old. Performed at [facility], [place] [address] [place], [place] [address]     Labs: CBC: Recent Labs  Lab 11/21/22 2207  WBC 5.0  HGB 12.1*  HCT 37.3*  MCV 86.3  PLT 123XX123*   Basic Metabolic Panel: Recent Labs  Lab 11/21/22 2207  NA 137  K 4.2  CL 102  CO2 25  GLUCOSE 96  BUN 28*  CREATININE 1.66*  CALCIUM 8.6*  MG 2.1   Liver Function Tests: No results for input(s): "AST", "ALT", "ALKPHOS", "BILITOT", "PROT", "ALBUMIN" in the last 168 hours. CBG: No results for input(s): "GLUCAP" in the last 168 hours.  Discharge time spent: greater than 30 minutes.  Signed: Sharen Hones, MD [facility] 11/23/2022

## 2022-11-23 NOTE — ED Notes (Signed)
Report received. Pt noted to be resting quietly in bed with eyes closed. Respirations even and unlabored. Bed in lowest position and locked with bed rails up X 2. No distress noted.

## 2022-11-23 NOTE — ED Notes (Signed)
Pt discharged via [facility] back to facility.

## 2022-11-23 NOTE — ED Notes (Signed)
[facility] called for transport to [facility] 

## 2022-11-24 LAB — LEVETIRACETAM LEVEL: Levetiracetam Lvl: 23 ug/mL (ref 10.0–40.0)

## 2023-02-15 ENCOUNTER — Other Ambulatory Visit: Payer: Self-pay

## 2023-02-15 ENCOUNTER — Emergency Department: Payer: Medicaid Other

## 2023-02-15 ENCOUNTER — Encounter: Payer: Self-pay | Admitting: Pharmacy Technician

## 2023-02-15 ENCOUNTER — Emergency Department
Admission: EM | Admit: 2023-02-15 | Discharge: 2023-02-15 | Disposition: A | Discharge status: Home / Self Care | Payer: Medicaid Other | Attending: Emergency Medicine | Admitting: Emergency Medicine

## 2023-02-15 DIAGNOSIS — E119 Type 2 diabetes mellitus without complications: Secondary | ICD-10-CM | POA: Diagnosis not present

## 2023-02-15 DIAGNOSIS — I447 Left bundle-branch block, unspecified: Secondary | ICD-10-CM | POA: Diagnosis not present

## 2023-02-15 DIAGNOSIS — R001 Bradycardia, unspecified: Secondary | ICD-10-CM | POA: Insufficient documentation

## 2023-02-15 DIAGNOSIS — I110 Hypertensive heart disease with heart failure: Secondary | ICD-10-CM | POA: Insufficient documentation

## 2023-02-15 DIAGNOSIS — I509 Heart failure, unspecified: Secondary | ICD-10-CM | POA: Insufficient documentation

## 2023-02-15 DIAGNOSIS — R569 Unspecified convulsions: Secondary | ICD-10-CM | POA: Diagnosis present

## 2023-02-15 DIAGNOSIS — Z8673 Personal history of transient ischemic attack (TIA), and cerebral infarction without residual deficits: Secondary | ICD-10-CM | POA: Diagnosis not present

## 2023-02-15 LAB — URINALYSIS, W/ REFLEX TO CULTURE (INFECTION SUSPECTED)
Bacteria, UA: NONE SEEN
Bilirubin Urine: NEGATIVE
Glucose, UA: NEGATIVE mg/dL
Hgb urine dipstick: NEGATIVE
Ketones, ur: NEGATIVE mg/dL
Leukocytes,Ua: NEGATIVE
Nitrite: NEGATIVE
Protein, ur: NEGATIVE mg/dL
Specific Gravity, Urine: 1.012 (ref 1.005–1.030)
Squamous Epithelial / HPF: NONE SEEN /HPF (ref 0–5)
pH: 5 (ref 5.0–8.0)

## 2023-02-15 LAB — COMPREHENSIVE METABOLIC PANEL WITH GFR
ALT: 16 U/L (ref 0–44)
AST: 22 U/L (ref 15–41)
Albumin: 3.5 g/dL (ref 3.5–5.0)
Alkaline Phosphatase: 54 U/L (ref 38–126)
Anion gap: 9 (ref 5–15)
BUN: 22 mg/dL (ref 8–23)
CO2: 25 mmol/L (ref 22–32)
Calcium: 8.8 mg/dL — ABNORMAL LOW (ref 8.9–10.3)
Chloride: 100 mmol/L (ref 98–111)
Creatinine, Ser: 1.64 mg/dL — ABNORMAL HIGH (ref 0.61–1.24)
GFR, Estimated: 47 mL/min — ABNORMAL LOW
Glucose, Bld: 93 mg/dL (ref 70–99)
Potassium: 4.5 mmol/L (ref 3.5–5.1)
Sodium: 134 mmol/L — ABNORMAL LOW (ref 135–145)
Total Bilirubin: 0.8 mg/dL (ref 0.3–1.2)
Total Protein: 7.4 g/dL (ref 6.5–8.1)

## 2023-02-15 LAB — CBC WITH DIFFERENTIAL/PLATELET
Abs Immature Granulocytes: 0.03 10*3/uL (ref 0.00–0.07)
Basophils Absolute: 0 10*3/uL (ref 0.0–0.1)
Basophils Relative: 0 %
Eosinophils Absolute: 0.1 10*3/uL (ref 0.0–0.5)
Eosinophils Relative: 1 %
HCT: 37.4 % — ABNORMAL LOW (ref 39.0–52.0)
Hemoglobin: 12.2 g/dL — ABNORMAL LOW (ref 13.0–17.0)
Immature Granulocytes: 1 %
Lymphocytes Relative: 25 %
Lymphs Abs: 1.4 10*3/uL (ref 0.7–4.0)
MCH: 29 pg (ref 26.0–34.0)
MCHC: 32.6 g/dL (ref 30.0–36.0)
MCV: 88.8 fL (ref 80.0–100.0)
Monocytes Absolute: 0.5 10*3/uL (ref 0.1–1.0)
Monocytes Relative: 8 %
Neutro Abs: 3.5 10*3/uL (ref 1.7–7.7)
Neutrophils Relative %: 65 %
Platelets: 160 10*3/uL (ref 150–400)
RBC: 4.21 MIL/uL — ABNORMAL LOW (ref 4.22–5.81)
RDW: 14 % (ref 11.5–15.5)
WBC: 5.4 10*3/uL (ref 4.0–10.5)
nRBC: 0.4 % — ABNORMAL HIGH (ref 0.0–0.2)

## 2023-02-15 LAB — VALPROIC ACID LEVEL: Valproic Acid Lvl: 94 ug/mL (ref 50.0–100.0)

## 2023-02-15 MED ORDER — SODIUM CHLORIDE 0.9 % IV BOLUS
500.0000 mL | Freq: Once | INTRAVENOUS | Status: AC
  Administered 2023-02-15: 500 mL via INTRAVENOUS

## 2023-02-15 MED ORDER — LEVETIRACETAM IN NACL 1000 MG/100ML IV SOLN
1000.0000 mg | INTRAVENOUS | Status: AC
  Administered 2023-02-15: 1000 mg via INTRAVENOUS
  Filled 2023-02-15: qty 100

## 2023-02-15 NOTE — Discharge Instructions (Addendum)
Your tests in the emergency room today were all okay.  Be sure that you are taking Keppra 1000 mg 2 times a day as directed by neurology.  Please contact Dr. Margaretmary Eddy office for follow-up.

## 2023-02-15 NOTE — ED Triage Notes (Signed)
Pt bib ems from [facility] for multiple seizures today. First seizure today around 1100, given 0.5mg  ativan. Pt then had back to back seizures around 1500 and given another 0.5mg  ativan. VSS with ems.

## 2023-02-15 NOTE — ED Provider Notes (Signed)
[facility] Provider Note    Event Date/Time   First MD Initiated Contact with Patient 02/15/23 1726     (approximate)   History   Chief Complaint: Seizures   HPI  Jeremy Browning is a 62 y.o. male with a history of diabetes hypertension right MCA stroke with left-sided hemiparesis, seizures, CHF who was brought to the ED due to recurrent seizures this morning.  Patient reports feeling weak.  Denies pain.  Says that he needs his dentures so that he can take his medicines and eat.  Reviewed neurology note from November 29, 2022, current medication recommendations: 1. Seizures likely from right hemisphere in a patient with history of learning disability and child hood seizures. Patient evaluated at [facility] 03/08/2022 through 03/13/2022 for breakthrough seizures. Additional breakthrough seizures on 04/19/2022 and 11/22/2022.  - Previously recommended EEG at last visit (no show)  Keppra dose was decreased from 1500 mg twice daily to 1000 mg twice daily on 06/23/2023 after his levels were 83. Between this time and today (11/29/2022), his Keppra was further lowered to 500 mg twice daily.   Increase Levetiracetam to 1000 mg twice daily  Continue/Refill Depakote 750 mg twice daily      Physical Exam   Triage Vital Signs: ED Triage Vitals  Enc Vitals Group     BP 02/15/23 1722 (!) 147/86     Pulse Rate 02/15/23 1722 76     Resp 02/15/23 1722 18     Temp 02/15/23 1722 99.4 F (37.4 C)     Temp Source 02/15/23 1722 Oral     SpO2 02/15/23 1722 96 %     Weight --      Height --      Head Circumference --      Peak Flow --      Pain Score 02/15/23 1721 0     Pain Loc --      Pain Edu? --      Excl. in GC? --     Most recent vital signs: Vitals:   02/15/23 1722  BP: (!) 147/86  Pulse: 76  Resp: 18  Temp: 99.4 F (37.4 C)  SpO2: 96%    General: Awake, no distress.  CV:  Good peripheral perfusion.  Regular rate and rhythm Resp:  Normal effort.   Clear to auscultation bilaterally Abd:  No distention.  Soft nontender Other:  Dry oral mucosa   ED Results / Procedures / Treatments   Labs (all labs ordered are listed, but only abnormal results are displayed) Labs Reviewed  COMPREHENSIVE METABOLIC PANEL - Abnormal; Notable for the following components:      Result Value   Sodium 134 (*)    Creatinine, Ser 1.64 (*)    Calcium 8.8 (*)    GFR, Estimated 47 (*)    All other components within normal limits  CBC WITH DIFFERENTIAL/PLATELET - Abnormal; Notable for the following components:   RBC 4.21 (*)    Hemoglobin 12.2 (*)    HCT 37.4 (*)    nRBC 0.4 (*)    All other components within normal limits  URINALYSIS, W/ REFLEX TO CULTURE (INFECTION SUSPECTED) - Abnormal; Notable for the following components:   Color, Urine YELLOW (*)    APPearance CLEAR (*)    All other components within normal limits  VALPROIC ACID LEVEL     EKG Interpreted by me Sinus bradycardia rate of 56.  Right axis, left bundle branch block.  No acute ischemic changes.  RADIOLOGY Chest x-ray interpreted by me, appears unremarkable.  Radiology report reviewed   PROCEDURES:  Procedures   MEDICATIONS ORDERED IN ED: Medications  sodium chloride 0.9 % bolus 500 mL (0 mLs Intravenous Stopped 02/15/23 2041)  levETIRAcetam (KEPPRA) IVPB 1000 mg/100 mL premix (0 mg Intravenous Stopped 02/15/23 1926)     IMPRESSION / MDM / ASSESSMENT AND PLAN / ED COURSE  I reviewed the triage vital signs and the nursing notes.  DDx: Subtherapeutic antiepileptic medications, electrolyte abnormality, AKI, dehydration, pneumonia, UTI  Patient's presentation is most consistent with acute presentation with potential threat to life or bodily function.  Patient presents with breakthrough seizures today, possibly related to underdosing of his medication.  Will check labs while giving IV fluids and Keppra.   ----------------------------------------- 8:41 PM on  02/15/2023 ----------------------------------------- No seizures in the ED.  Workup unremarkable, patient appears to be at his baseline state of health.  Stable for discharge.      FINAL CLINICAL IMPRESSION(S) / ED DIAGNOSES   Final diagnoses:  Seizure (HCC)     Rx / DC Orders   ED Discharge Orders     None        Note:  This document was prepared using Dragon voice recognition software and may include unintentional dictation errors.   Sharman Cheek, MD 02/15/23 2042

## 2023-08-21 ENCOUNTER — Other Ambulatory Visit: Payer: Self-pay

## 2023-08-21 ENCOUNTER — Encounter: Payer: Self-pay | Admitting: Emergency Medicine

## 2023-08-21 ENCOUNTER — Emergency Department: Payer: Medicaid Other

## 2023-08-21 ENCOUNTER — Inpatient Hospital Stay
Admission: EM | Admit: 2023-08-21 | Discharge: 2023-08-28 | DRG: 100 | Disposition: A | Discharge status: Skilled Nursing Facility | Payer: Medicaid Other | Attending: Internal Medicine | Admitting: Internal Medicine

## 2023-08-21 ENCOUNTER — Observation Stay: Payer: Medicaid Other

## 2023-08-21 DIAGNOSIS — I69354 Hemiplegia and hemiparesis following cerebral infarction affecting left non-dominant side: Secondary | ICD-10-CM

## 2023-08-21 DIAGNOSIS — I10 Essential (primary) hypertension: Secondary | ICD-10-CM | POA: Diagnosis present

## 2023-08-21 DIAGNOSIS — S065XAA Traumatic subdural hemorrhage with loss of consciousness status unknown, initial encounter: Secondary | ICD-10-CM

## 2023-08-21 DIAGNOSIS — Z1621 Resistance to vancomycin: Secondary | ICD-10-CM | POA: Diagnosis present

## 2023-08-21 DIAGNOSIS — B952 Enterococcus as the cause of diseases classified elsewhere: Secondary | ICD-10-CM | POA: Diagnosis present

## 2023-08-21 DIAGNOSIS — E785 Hyperlipidemia, unspecified: Secondary | ICD-10-CM | POA: Diagnosis present

## 2023-08-21 DIAGNOSIS — G4730 Sleep apnea, unspecified: Secondary | ICD-10-CM | POA: Diagnosis present

## 2023-08-21 DIAGNOSIS — Z87891 Personal history of nicotine dependence: Secondary | ICD-10-CM

## 2023-08-21 DIAGNOSIS — J449 Chronic obstructive pulmonary disease, unspecified: Secondary | ICD-10-CM | POA: Diagnosis present

## 2023-08-21 DIAGNOSIS — R1110 Vomiting, unspecified: Secondary | ICD-10-CM | POA: Diagnosis present

## 2023-08-21 DIAGNOSIS — E1129 Type 2 diabetes mellitus with other diabetic kidney complication: Secondary | ICD-10-CM | POA: Diagnosis present

## 2023-08-21 DIAGNOSIS — Z8673 Personal history of transient ischemic attack (TIA), and cerebral infarction without residual deficits: Secondary | ICD-10-CM

## 2023-08-21 DIAGNOSIS — G40109 Localization-related (focal) (partial) symptomatic epilepsy and epileptic syndromes with simple partial seizures, not intractable, without status epilepticus: Principal | ICD-10-CM | POA: Diagnosis present

## 2023-08-21 DIAGNOSIS — G931 Anoxic brain damage, not elsewhere classified: Secondary | ICD-10-CM | POA: Diagnosis present

## 2023-08-21 DIAGNOSIS — D649 Anemia, unspecified: Secondary | ICD-10-CM | POA: Insufficient documentation

## 2023-08-21 DIAGNOSIS — D638 Anemia in other chronic diseases classified elsewhere: Secondary | ICD-10-CM | POA: Insufficient documentation

## 2023-08-21 DIAGNOSIS — I6201 Nontraumatic acute subdural hemorrhage: Secondary | ICD-10-CM | POA: Diagnosis not present

## 2023-08-21 DIAGNOSIS — G9341 Metabolic encephalopathy: Secondary | ICD-10-CM

## 2023-08-21 DIAGNOSIS — G40919 Epilepsy, unspecified, intractable, without status epilepticus: Secondary | ICD-10-CM | POA: Diagnosis not present

## 2023-08-21 DIAGNOSIS — Z841 Family history of disorders of kidney and ureter: Secondary | ICD-10-CM

## 2023-08-21 DIAGNOSIS — D631 Anemia in chronic kidney disease: Secondary | ICD-10-CM | POA: Diagnosis present

## 2023-08-21 DIAGNOSIS — N3000 Acute cystitis without hematuria: Secondary | ICD-10-CM

## 2023-08-21 DIAGNOSIS — N1831 Chronic kidney disease, stage 3a: Secondary | ICD-10-CM | POA: Diagnosis present

## 2023-08-21 DIAGNOSIS — Z8249 Family history of ischemic heart disease and other diseases of the circulatory system: Secondary | ICD-10-CM

## 2023-08-21 DIAGNOSIS — R569 Unspecified convulsions: Principal | ICD-10-CM

## 2023-08-21 DIAGNOSIS — Z811 Family history of alcohol abuse and dependence: Secondary | ICD-10-CM

## 2023-08-21 DIAGNOSIS — Z833 Family history of diabetes mellitus: Secondary | ICD-10-CM

## 2023-08-21 DIAGNOSIS — M25422 Effusion, left elbow: Secondary | ICD-10-CM | POA: Diagnosis present

## 2023-08-21 DIAGNOSIS — I2729 Other secondary pulmonary hypertension: Secondary | ICD-10-CM | POA: Diagnosis present

## 2023-08-21 DIAGNOSIS — I5032 Chronic diastolic (congestive) heart failure: Secondary | ICD-10-CM | POA: Diagnosis not present

## 2023-08-21 DIAGNOSIS — Z7901 Long term (current) use of anticoagulants: Secondary | ICD-10-CM

## 2023-08-21 DIAGNOSIS — Z818 Family history of other mental and behavioral disorders: Secondary | ICD-10-CM

## 2023-08-21 DIAGNOSIS — G9389 Other specified disorders of brain: Secondary | ICD-10-CM | POA: Diagnosis present

## 2023-08-21 DIAGNOSIS — Z79899 Other long term (current) drug therapy: Secondary | ICD-10-CM

## 2023-08-21 DIAGNOSIS — M25522 Pain in left elbow: Secondary | ICD-10-CM | POA: Insufficient documentation

## 2023-08-21 DIAGNOSIS — Z825 Family history of asthma and other chronic lower respiratory diseases: Secondary | ICD-10-CM

## 2023-08-21 DIAGNOSIS — E1122 Type 2 diabetes mellitus with diabetic chronic kidney disease: Secondary | ICD-10-CM | POA: Diagnosis present

## 2023-08-21 DIAGNOSIS — E11649 Type 2 diabetes mellitus with hypoglycemia without coma: Secondary | ICD-10-CM | POA: Diagnosis not present

## 2023-08-21 DIAGNOSIS — E162 Hypoglycemia, unspecified: Secondary | ICD-10-CM

## 2023-08-21 DIAGNOSIS — N189 Chronic kidney disease, unspecified: Secondary | ICD-10-CM | POA: Diagnosis present

## 2023-08-21 DIAGNOSIS — N179 Acute kidney failure, unspecified: Secondary | ICD-10-CM | POA: Diagnosis present

## 2023-08-21 DIAGNOSIS — Z7982 Long term (current) use of aspirin: Secondary | ICD-10-CM

## 2023-08-21 DIAGNOSIS — Z7984 Long term (current) use of oral hypoglycemic drugs: Secondary | ICD-10-CM

## 2023-08-21 DIAGNOSIS — I130 Hypertensive heart and chronic kidney disease with heart failure and stage 1 through stage 4 chronic kidney disease, or unspecified chronic kidney disease: Secondary | ICD-10-CM | POA: Diagnosis present

## 2023-08-21 DIAGNOSIS — I482 Chronic atrial fibrillation, unspecified: Secondary | ICD-10-CM | POA: Diagnosis present

## 2023-08-21 DIAGNOSIS — I2510 Atherosclerotic heart disease of native coronary artery without angina pectoris: Secondary | ICD-10-CM | POA: Diagnosis present

## 2023-08-21 DIAGNOSIS — Z823 Family history of stroke: Secondary | ICD-10-CM

## 2023-08-21 HISTORY — DX: Chronic atrial fibrillation, unspecified: I48.20

## 2023-08-21 LAB — COMPREHENSIVE METABOLIC PANEL
ALT: 12 U/L (ref 0–44)
AST: 27 U/L (ref 15–41)
Albumin: 3.3 g/dL — ABNORMAL LOW (ref 3.5–5.0)
Alkaline Phosphatase: 52 U/L (ref 38–126)
Anion gap: 12 (ref 5–15)
BUN: 36 mg/dL — ABNORMAL HIGH (ref 8–23)
CO2: 26 mmol/L (ref 22–32)
Calcium: 8.8 mg/dL — ABNORMAL LOW (ref 8.9–10.3)
Chloride: 102 mmol/L (ref 98–111)
Creatinine, Ser: 1.56 mg/dL — ABNORMAL HIGH (ref 0.61–1.24)
GFR, Estimated: 50 mL/min — ABNORMAL LOW (ref >60)
Glucose, Bld: 122 mg/dL — ABNORMAL HIGH (ref 70–99)
Potassium: 3.7 mmol/L (ref 3.5–5.1)
Sodium: 140 mmol/L (ref 135–145)
Total Bilirubin: 0.8 mg/dL (ref 0.0–1.2)
Total Protein: 7.5 g/dL (ref 6.5–8.1)

## 2023-08-21 LAB — BRAIN NATRIURETIC PEPTIDE: B Natriuretic Peptide: 87.2 pg/mL (ref 0.0–100.0)

## 2023-08-21 LAB — CBC
HCT: 32.6 % — ABNORMAL LOW (ref 39.0–52.0)
Hemoglobin: 10.3 g/dL — ABNORMAL LOW (ref 13.0–17.0)
MCH: 28.8 pg (ref 26.0–34.0)
MCHC: 31.6 g/dL (ref 30.0–36.0)
MCV: 91.1 fL (ref 80.0–100.0)
Platelets: 226 10*3/uL (ref 150–400)
RBC: 3.58 MIL/uL — ABNORMAL LOW (ref 4.22–5.81)
RDW: 14.8 % (ref 11.5–15.5)
WBC: 6.6 10*3/uL (ref 4.0–10.5)
nRBC: 0 % (ref 0.0–0.2)

## 2023-08-21 MED ORDER — ALBUTEROL SULFATE (2.5 MG/3ML) 0.083% IN NEBU: 2.5000 mg | INHALATION_SOLUTION | RESPIRATORY_TRACT | Status: DC | PRN

## 2023-08-21 MED ORDER — DM-GUAIFENESIN ER 30-600 MG PO TB12
1.0000 | ORAL_TABLET | Freq: Two times a day (BID) | ORAL | Status: DC | PRN
  Administered 2023-08-27: 1 via ORAL
  Filled 2023-08-21 (×2): qty 1

## 2023-08-21 MED ORDER — ACETAMINOPHEN 325 MG PO TABS: 650.0000 mg | ORAL_TABLET | Freq: Four times a day (QID) | ORAL | Status: DC | PRN

## 2023-08-21 MED ORDER — ONDANSETRON HCL 4 MG/2ML IJ SOLN
4.0000 mg | Freq: Once | INTRAMUSCULAR | Status: AC
  Administered 2023-08-21: 4 mg via INTRAVENOUS

## 2023-08-21 MED ORDER — LORAZEPAM 2 MG/ML IJ SOLN
1.0000 mg | Freq: Once | INTRAMUSCULAR | Status: AC
Start: 2023-08-21 — End: 2023-08-21
  Administered 2023-08-21: 1 mg via INTRAVENOUS
  Filled 2023-08-21: qty 1

## 2023-08-21 MED ORDER — VALPROATE SODIUM 100 MG/ML IV SOLN
750.0000 mg | Freq: Two times a day (BID) | INTRAVENOUS | Status: DC
  Administered 2023-08-21 – 2023-08-24 (×6): 750 mg via INTRAVENOUS
  Filled 2023-08-21 (×3): qty 7.5
  Filled 2023-08-21: qty 5
  Filled 2023-08-21 (×4): qty 7.5

## 2023-08-21 MED ORDER — ONDANSETRON HCL 4 MG/2ML IJ SOLN: 4.0000 mg | Freq: Three times a day (TID) | INTRAMUSCULAR | Status: DC | PRN

## 2023-08-21 MED ORDER — ONDANSETRON HCL 4 MG/2ML IJ SOLN
INTRAMUSCULAR | Status: AC
  Filled 2023-08-21: qty 2

## 2023-08-21 MED ORDER — HYDRALAZINE HCL 20 MG/ML IJ SOLN: 5.0000 mg | INTRAMUSCULAR | Status: DC | PRN

## 2023-08-21 MED ORDER — LEVETIRACETAM IN NACL 1500 MG/100ML IV SOLN
1500.0000 mg | Freq: Once | INTRAVENOUS | Status: AC
  Administered 2023-08-21: 1500 mg via INTRAVENOUS
  Filled 2023-08-21: qty 100

## 2023-08-21 MED ORDER — LORAZEPAM 2 MG/ML IJ SOLN: 2.0000 mg | INTRAMUSCULAR | Status: DC | PRN

## 2023-08-21 MED ORDER — LORAZEPAM 2 MG/ML IJ SOLN
1.0000 mg | Freq: Once | INTRAMUSCULAR | Status: AC
  Administered 2023-08-21: 1 mg via INTRAVENOUS
  Filled 2023-08-21: qty 1

## 2023-08-21 NOTE — H&P (Signed)
 History and Physical    Jeremy Browning FMW:[id number] DOB: December 02, 1960 DOA: 08/21/2023  Referring MD/NP/PA:   PCP: Fleeta Valeria Mayo, MD   Patient coming from:  The patient is coming from SNF   Chief Complaint: seizure  HPI: Jeremy Browning is a 62 y.o. 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  Patient is in postictal status, barely arousable, cannot provide any medical history, therefore, most of the history is obtained by discussing the case with ED physician, per EMS report, and with the nursing staff. Chart review revealed that patient has had multiple overnight admission due to breakthrough seizure. He is frequently observed in the hospital overnight and discharged the next day without change in his medications.   Per report, pt was noted have seizure again at [facility]. Per EMS, 1mg  ativan  given at [facility] with continued twitching noted on arrival to ED. Pt is given another 1mg  of ativan  and loaded with 1500 mg of keppra  in ED. When I saw pt in ED, he is in postictal status, barely arousable, not following command. Per EDP, pt has vomited in ED, with concerning for aspiration.  No respiratory distress, cough, diarrhea noted.  Patient has chronic left hemiparesis from a previous stroke.   Data reviewed independently and ED Course: pt was found to have WBC 6.6, stable renal function, temperature normal, blood pressure 102/80, heart rate of 108, RR 29, oxygen saturation 98% on room air.  Chest x-ray is negative for infiltration.  Patient is placed in PCU for observation.   EKG: I have personally reviewed.  Seem to be in atrial fibrillation, QTc 498, left bundle blockade with old   Review of Systems: Could not be reviewed since patient is postictal status.   Allergy: No Known Allergies  Past Medical History:  Diagnosis  Date   CHF (congestive heart failure) (HCC)    Chronic atrial fibrillation (HCC)    Diabetes mellitus type II, non insulin  dependent (HCC)    Essential hypertension [name]   h/o Proteus pneumonia (HCC) 02/17/2021   Heart failure (HCC)    Unknown details.  By report no cardiac surgery   Seizures (HCC)    Severe hypoxic-ischemic encephalopathy    Sleep apnea    Stroke [facility])    Venous stasis dermatitis of left lower extremity     Past Surgical History:  Procedure Laterality Date   HARDWARE REMOVAL Left 02/10/2022   Procedure: HARDWARE REMOVAL LEFT TIBIA;  Surgeon: Harden Jerona GAILS, MD;  Location: [facility] OR;  Service: Orthopedics;  Laterality: Left;   I & D EXTREMITY Left 02/10/2022   Procedure: DEBRIDEMENT OF LEFT TIBIA;  Surgeon: Harden Jerona GAILS, MD;  Location: [facility] OR;  Service: Orthopedics;  Laterality: Left;   LEFT HEART CATH AND CORONARY ANGIOGRAPHY N/A 12/25/2020   Procedure: LEFT HEART CATH AND CORONARY ANGIOGRAPHY;  Surgeon: Anner Alm ORN, MD;  Location: [facility] INVASIVE CV LAB;  Service: Cardiovascular;  Laterality: N/A; (In setting of Acute Hypoxic and Hypercapnic Respiratory Failure with Flash Pulm Edema/Hypertensive Emergency) - Angiographically minimal CAD.  Moderately reduced LV function with EF of 35 to 45%. LV P-EDP 101/21 mmHg - 30 mmHg   LEFT HEART CATH AND CORONARY ANGIOGRAPHY  09/12/2018   [facility]): Ostial LAD ~20% otherwise normal coronary arteries. LVEDP 20 mmHg;- HTN Emergency related Type II MI>   PEG PLACEMENT N/A 01/11/2021  Procedure: PERCUTANEOUS ENDOSCOPIC GASTROSTOMY (PEG) PLACEMENT;  Surgeon: Jinny Carmine, MD;  Location: [facility] ENDOSCOPY;  Service: Endoscopy;  Laterality: N/A;   RIGHT HEART CATH N/A 12/25/2020   Procedure: RIGHT HEART CATH;  Surgeon: Anner Alm ORN, MD;  Location: [facility] INVASIVE CV LAB;  Service: Cardiovascular; R IJ: (Acute Hypoxic and Hypercapnic Respiratory Failure with Flash Pulm Edema/Hypertensive Emergency): CO-CI 6.54-2.82. Mild Secondary  Pulmonary Hypertension (PCWP 30 mmHg.  PAP 47/13 mmHg with a mean PAP of 33 mmHg, RAP 22 mmHg in setting of AoP-MAP 97/59 mmHg - 71 mmHg,   TRACHEOSTOMY TUBE PLACEMENT N/A 01/12/2021   Procedure: TRACHEOSTOMY;  Surgeon: Milissa Hamming, MD;  Location: [facility] ORS;  Service: ENT;  Laterality: N/A;   TRANSTHORACIC ECHOCARDIOGRAM  09/11/2018   [facility]: Moderately reduced LVEF ~35%. 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 Mild-mod AI, Trivial MR?TR. LVEDD 6.0 (reduced compared to 2017: EF ~40-45%, LVEDD 5.6   TRANSTHORACIC ECHOCARDIOGRAM  12/27/2020   [facility]: (In Setting of Acute Hypoxic and Hypercapnic Respiratory Failure with Flash Pulm Edema/Hypertensive Emergency)  EF 45-50%. Mildly reduced LVEF with global HK. Moderately dilated LV with Gr 1 DD - mild LA dilation. Normal RV size Fxn.  Normal Vavles   TRANSTHORACIC ECHOCARDIOGRAM  03/07/2021   [facility]: EF improved to 60-65% with Nl LV Fxn & ~ normal diastolic fxn. Normal RV size & fxn. Mild MR & AI.  Normal LA size.   Unknown      Social History:  reports that he quit smoking about 4 years ago. His smoking use included cigarettes. He started smoking about 11 years ago. He has a 3.5 pack-year smoking history. He has never used smokeless tobacco. He reports that he does not currently use alcohol . He reports that he does not currently use drugs.  Family History:  Family History  Problem Relation Age of Onset   Asthma Mother    Hypertension Mother        LIkely Severe/Malignant   Kidney failure Mother        ESRD - HD   CVA Mother    Alcohol  abuse Father    Depression Father    Asthma Sister    Hypertension Sister    Diabetes Mellitus II Sister    Asthma Brother    Hypertension Brother      Prior to Admission medications   Medication Sig Start Date End Date Taking? Authorizing Provider  apixaban  (ELIQUIS ) 5 MG TABS tablet Take 5 mg by mouth 2 (two) times daily.    [provider]  aspirin  EC 81 MG tablet Take 81 mg by  mouth daily.    [provider]  atorvastatin  (LIPITOR ) 80 MG tablet Take 80 mg by mouth daily.    [provider]  Carboxymethylcellulose Sod PF 1 % GEL Apply in both eyes in the morning and at bedtime.    [provider]  diclofenac  Sodium (VOLTAREN ) 1 % GEL Apply 2 g topically 4 (four) times daily. Patient not taking: Reported on 11/22/2022    [provider]  divalproex  (DEPAKOTE ) 250 MG DR tablet Take 3 tablets (750 mg total) by mouth 2 (two) times daily. 04/21/22   Awanda City, MD  furosemide  (LASIX ) 20 MG tablet Take 40 mg by mouth daily.    [provider]  levETIRAcetam  (KEPPRA ) 750 MG tablet Take 2 tablets (1,500 mg total) by mouth 2 (two) times daily. Patient taking differently: Take 750 mg by mouth daily. 03/13/22   Bryn Bernardino NOVAK, MD  LORazepam  (  ATIVAN ) 2 MG/ML injection Inject 1 mL (2 mg total) into the muscle every 8 (eight) hours as needed for seizure. Inject 0.5 ml intramuscularly one time only related to epilepsy. 11/23/22   Laurita Pillion, MD  losartan  (COZAAR ) 25 MG tablet Take 25 mg by mouth daily. Patient not taking: Reported on 11/22/2022    [provider]  sertraline  (ZOLOFT ) 50 MG tablet Take 50 mg by mouth daily.    [provider]  tamsulosin  (FLOMAX ) 0.4 MG CAPS capsule Take 0.4 mg by mouth daily.    [provider]    Physical Exam: Vitals:   08/21/23 2145 08/21/23 2200 08/21/23 2215 08/21/23 2230  BP: (!) 122/51 119/61 (!) 116/55 (!) 116/52  Pulse: 87 81 77 75  Resp: 15 15 17 13   Temp:      TempSrc:      SpO2: 100% 100% 100% 100%  Weight:      Height:       General: Not in acute distress HEENT:       Eyes: PERRL, EOMI, no jaundice       ENT: No discharge from the ears and nose       Neck: No JVD, no bruit, no mass felt. Heme: No neck lymph node enlargement. Cardiac: S1/S2, RRR, No murmurs, No gallops or rubs. Respiratory: No rales, wheezing, rhonchi or rubs. GI: Soft, nondistended, nontender,  no organomegaly, BS present. GU: No hematuria Ext: has 1+ leg edema bilaterally. 1+DP/PT pulse bilaterally. Musculoskeletal: No joint deformities, No joint redness or warmth, no limitation of ROM in spin. Skin: No rashes.  Neuro: Patient is in postictal status, barely arousable, not following command, cranial nerves II-XII grossly intact, has chronic left sided hemiparesis with left hand contracture  Psych: Patient is not psychotic, no suicidal or hemocidal ideation.  Labs on Admission: I have personally reviewed following labs and imaging studies  CBC: Recent Labs  Lab 08/21/23 2114  WBC 6.6  HGB 10.3*  HCT 32.6*  MCV 91.1  PLT 226   Basic Metabolic Panel: Recent Labs  Lab 08/21/23 2114  NA 140  K 3.7  CL 102  CO2 26  GLUCOSE 122*  BUN 36*  CREATININE 1.56*  CALCIUM  8.8*   GFR: Estimated Creatinine Clearance: 49.5 mL/min (A) (by C-G formula based on SCr of 1.56 mg/dL (H)). Liver Function Tests: Recent Labs  Lab 08/21/23 2114  AST 27  ALT 12  ALKPHOS 52  BILITOT 0.8  PROT 7.5  ALBUMIN 3.3*   No results for input(s): LIPASE, AMYLASE in the last 168 hours. No results for input(s): AMMONIA in the last 168 hours. Coagulation Profile: No results for input(s): INR, PROTIME in the last 168 hours. Cardiac Enzymes: No results for input(s): CKTOTAL, CKMB, CKMBINDEX, TROPONINI in the last 168 hours. BNP (last 3 results) No results for input(s): PROBNP in the last 8760 hours. HbA1C: No results for input(s): HGBA1C in the last 72 hours. CBG: No results for input(s): GLUCAP in the last 168 hours. Lipid Profile: No results for input(s): CHOL, HDL, LDLCALC, TRIG, CHOLHDL, LDLDIRECT in the last 72 hours. Thyroid Function Tests: No results for input(s): TSH, T4TOTAL, FREET4, T3FREE, THYROIDAB in the last 72 hours. Anemia Panel: No results for input(s): VITAMINB12, FOLATE, FERRITIN, TIBC, IRON, RETICCTPCT in the  last 72 hours. Urine analysis:    Component Value Date/Time   COLORURINE YELLOW (A) 02/15/2023 2010   APPEARANCEUR CLEAR (A) 02/15/2023 2010   LABSPEC 1.012 02/15/2023 2010   PHURINE 5.0 02/15/2023 2010  GLUCOSEU NEGATIVE 02/15/2023 2010   HGBUR NEGATIVE 02/15/2023 2010   BILIRUBINUR NEGATIVE 02/15/2023 2010   KETONESUR NEGATIVE 02/15/2023 2010   PROTEINUR NEGATIVE 02/15/2023 2010   NITRITE NEGATIVE 02/15/2023 2010   LEUKOCYTESUR NEGATIVE 02/15/2023 2010   Sepsis Labs: @LABRCNTIP (procalcitonin:4,lacticidven:4) )No results found for this or any previous visit (from the past 240 hours).   Radiological Exams on Admission: DG Chest Port 1 View Result Date: 08/21/2023 CLINICAL DATA:  aspiration? EXAM: PORTABLE CHEST 1 VIEW COMPARISON:  Chest x-ray 02/15/2023 FINDINGS: The heart and mediastinal contours are within normal limits. Atherosclerotic plaque. Low lung volumes. No focal consolidation. No pulmonary edema. No pleural effusion. No pneumothorax. No acute osseous abnormality. IMPRESSION: 1. Low lung volumes with no active disease. 2.  Aortic Atherosclerosis (ICD10-I70.0). Electronically Signed   By: Morgane  Naveau M.D.   On: 08/21/2023 22:58      Assessment/Plan Principal Problem:   Breakthrough seizure (HCC) Active Problems:   Chronic diastolic CHF (congestive heart failure) (HCC)   CAD (coronary artery disease)   Essential hypertension   COPD (chronic obstructive pulmonary disease) (HCC)   Hyperlipidemia   Type II diabetes mellitus with renal manifestations (HCC)   Stage 3a chronic kidney disease (HCC)   Chronic atrial fibrillation (HCC)   Assessment and Plan:  Breakthrough seizure (HCC): pt is taking Keppra  1500 mg twice daily and Depakote  750 mg twice daily.   -Placed on PCU for observation -Seizure precaution -As needed Ativan  for seizure -IV Keppra  15 mg twice daily -IV Depakote  750 mg twice daily -Frequent neurocheck -Fall precaution -Check level for Keppra   and Depakote  -Aspiration precaution -Hold all oral medications until mental status improves -May need to discuss with neurology tomorrow -Follow-up with CT of head  Chronic diastolic CHF (congestive heart failure) (HCC): 2D echo on 11/22/2022 showed EF of 55 to 60% with grade 1 diastolic dysfunction.  Patient has 1+ leg edema, no oxygen desaturation, no pulm edema chest x-ray.  Does not seem to have CHF exacerbation -Check BNP -Hold all Lasix  temporarily until we need to see improves  CAD (coronary artery disease) -Hold aspirin , Lipitor   Essential hypertension -IV hydralazine  as needed -Hold Cozaar  and Lasix   COPD (chronic obstructive pulmonary disease) (HCC) -As needed albuterol  nebulizer  Hyperlipidemia -Hold Lipitor  for now  Diet controlled type II diabetes mellitus with renal manifestations (HCC): Recent A1c 5.5, well-controlled.  No medication on the list.  Blood sugar 122 -Check CBG every morning  Stage 3a chronic kidney disease [facility]): Renal function stable.  Baseline creatinine 1.64 on 02/15/2023.  His creatinine is 1.56, BUN 36, GFR 50 -Follow-up with BMP  Chronic atrial fibrillation (HCC): Heart rate of 108 -Hold Eliquis  -As needed metoprolol 2.5 mg every 2 hour for heart rate> 125      DVT ppx: SCD now (holding home oral meds including Eliquis )  Code Status: Full code    Family Communication: not done, no family member is at bed side.   Disposition Plan:  Anticipate discharge back to previous environment  Consults called:  none  Admission status and Level of care: Progressive:    for obs    Dispo: The patient is from: SNF              Anticipated d/c is to: SNF              Anticipated d/c date is: 1 day              Patient currently is not medically stable to d/c.  Severity of Illness:  The appropriate patient status for this patient is OBSERVATION. Observation status is judged to be reasonable and necessary in order to provide the required  intensity of service to ensure the patient's safety. The patient's presenting symptoms, physical exam findings, and initial radiographic and laboratory data in the context of their medical condition is felt to place them at decreased risk for further clinical deterioration. Furthermore, it is anticipated that the patient will be medically stable for discharge from the hospital within 2 midnights of admission.        Date of Service 08/21/2023    Caleb Exon [facility]   If 7PM-7AM, please contact night-coverage [email or web address] 08/21/2023, 11:14 PM

## 2023-08-21 NOTE — ED Triage Notes (Signed)
 Seizure activity noted at [facility] with ems being called. Per EMS, 1mg  ativan  given at [facility] with continued twitching noted on arrival to ED. Pt is Aox4 on arrival to ED and notes that his post seizure activity with twitching is his baseline. Pt reports hx of CVA with L side deficits with L arm being contractured up in place. Pt is actively vomiting during triage in ED with seizure precautions being in place.

## 2023-08-21 NOTE — ED Notes (Signed)
 Rainbow sent to lab

## 2023-08-21 NOTE — ED Provider Notes (Signed)
 [facility] Provider Note    Event Date/Time   First MD Initiated Contact with Patient 08/21/23 2105     (approximate)   History   Seizure   HPI  Jeremy Browning is a 62 y.o. male   with history of CHF diabetes stroke seizures, left hemiparesis who presents with breakthrough seizure.  Given 1 mg of Ativan  per EMS.  Upon arrival is conversing with me without difficulty, still having twitching motions of the left arm and left leg.  Upon review of records he frequently has focal seizures like this upon arrival to the emergency department.  Will give an additional dose of 1 mg Ativan .  Further review of records demonstrates the patient is frequently observed in the hospital overnight and discharged the next day without change in his medications      Physical Exam   Triage Vital Signs: ED Triage Vitals  Encounter Vitals Group     BP      Systolic BP Percentile      Diastolic BP Percentile      Pulse      Resp      Temp      Temp src      SpO2      Weight      Height      Head Circumference      Peak Flow      Pain Score      Pain Loc      Pain Education      Exclude from Growth Chart     Most recent vital signs: Vitals:   08/21/23 2215 08/21/23 2230  BP: (!) 116/55 (!) 116/52  Pulse: 77 75  Resp: 17 13  Temp:    SpO2: 100% 100%     General: Awake, no distress.  Alert and oriented CV:  Good peripheral perfusion.  Resp:  Normal effort.  Abd:  No distention.  Other:  Twitching movements of his left leg and left arm,   ED Results / Procedures / Treatments   Labs (all labs ordered are listed, but only abnormal results are displayed) Labs Reviewed  CBC - Abnormal; Notable for the following components:      Result Value   RBC 3.58 (*)    Hemoglobin 10.3 (*)    HCT 32.6 (*)    All other components within normal limits  COMPREHENSIVE METABOLIC PANEL - Abnormal; Notable for the following components:   Glucose, Bld 122 (*)    BUN 36  (*)    Creatinine, Ser 1.56 (*)    Calcium  8.8 (*)    Albumin 3.3 (*)    GFR, Estimated 50 (*)    All other components within normal limits  LEVETIRACETAM  LEVEL  VALPROIC  ACID LEVEL  BRAIN NATRIURETIC PEPTIDE     EKG    RADIOLOGY     PROCEDURES:  Critical Care performed:   Procedures   MEDICATIONS ORDERED IN ED: Medications  LORazepam  (ATIVAN ) injection 2 mg (has no administration in time range)  albuterol  (PROVENTIL ) (2.5 MG/3ML) 0.083% nebulizer solution 2.5 mg (has no administration in time range)  dextromethorphan -guaiFENesin  (MUCINEX  DM) 30-600 MG per 12 hr tablet 1 tablet (has no administration in time range)  LORazepam  (ATIVAN ) injection 1 mg (1 mg Intravenous Given 08/21/23 2125)  ondansetron  (ZOFRAN ) injection 4 mg (0 mg Intravenous Hold 08/21/23 2147)  levETIRAcetam  (KEPPRA ) IVPB 1500 mg/ 100 mL premix (0 mg Intravenous Stopped 08/21/23 2233)     IMPRESSION / MDM /  ASSESSMENT AND PLAN / ED COURSE  I reviewed the triage vital signs and the nursing notes. Patient's presentation is most consistent with exacerbation of chronic illness.  Patient with history of seizures as described above appears to be having focal seizures, will give an additional dose of 1 mg Ativan .  In the past has taken 2 doses of rescue medication to break his seizures.  Will monitor carefully.  Patient is having vomiting, after second dose of Ativan  seems to have improved.  He is at high risk for aspiration  X-ray ordered        FINAL CLINICAL IMPRESSION(S) / ED DIAGNOSES   Final diagnoses:  Seizures (HCC)     Rx / DC Orders   ED Discharge Orders     None        Note:  This document was prepared using Dragon voice recognition software and may include unintentional dictation errors.   Arlander Charleston, MD 08/21/23 2236

## 2023-08-22 DIAGNOSIS — Z79899 Other long term (current) drug therapy: Secondary | ICD-10-CM | POA: Diagnosis not present

## 2023-08-22 DIAGNOSIS — N1831 Chronic kidney disease, stage 3a: Secondary | ICD-10-CM | POA: Diagnosis present

## 2023-08-22 DIAGNOSIS — Z8673 Personal history of transient ischemic attack (TIA), and cerebral infarction without residual deficits: Secondary | ICD-10-CM

## 2023-08-22 DIAGNOSIS — G931 Anoxic brain damage, not elsewhere classified: Secondary | ICD-10-CM | POA: Diagnosis present

## 2023-08-22 DIAGNOSIS — J439 Emphysema, unspecified: Secondary | ICD-10-CM

## 2023-08-22 DIAGNOSIS — I2510 Atherosclerotic heart disease of native coronary artery without angina pectoris: Secondary | ICD-10-CM

## 2023-08-22 DIAGNOSIS — E11649 Type 2 diabetes mellitus with hypoglycemia without coma: Secondary | ICD-10-CM | POA: Diagnosis not present

## 2023-08-22 DIAGNOSIS — N179 Acute kidney failure, unspecified: Secondary | ICD-10-CM | POA: Diagnosis present

## 2023-08-22 DIAGNOSIS — I2729 Other secondary pulmonary hypertension: Secondary | ICD-10-CM | POA: Diagnosis present

## 2023-08-22 DIAGNOSIS — J449 Chronic obstructive pulmonary disease, unspecified: Secondary | ICD-10-CM | POA: Diagnosis present

## 2023-08-22 DIAGNOSIS — G9389 Other specified disorders of brain: Secondary | ICD-10-CM | POA: Diagnosis present

## 2023-08-22 DIAGNOSIS — E1122 Type 2 diabetes mellitus with diabetic chronic kidney disease: Secondary | ICD-10-CM | POA: Diagnosis present

## 2023-08-22 DIAGNOSIS — I5032 Chronic diastolic (congestive) heart failure: Secondary | ICD-10-CM | POA: Diagnosis present

## 2023-08-22 DIAGNOSIS — Z7984 Long term (current) use of oral hypoglycemic drugs: Secondary | ICD-10-CM | POA: Diagnosis not present

## 2023-08-22 DIAGNOSIS — R569 Unspecified convulsions: Secondary | ICD-10-CM | POA: Diagnosis present

## 2023-08-22 DIAGNOSIS — I130 Hypertensive heart and chronic kidney disease with heart failure and stage 1 through stage 4 chronic kidney disease, or unspecified chronic kidney disease: Secondary | ICD-10-CM | POA: Diagnosis present

## 2023-08-22 DIAGNOSIS — Z1621 Resistance to vancomycin: Secondary | ICD-10-CM | POA: Diagnosis present

## 2023-08-22 DIAGNOSIS — G40919 Epilepsy, unspecified, intractable, without status epilepticus: Secondary | ICD-10-CM | POA: Diagnosis not present

## 2023-08-22 DIAGNOSIS — E785 Hyperlipidemia, unspecified: Secondary | ICD-10-CM

## 2023-08-22 DIAGNOSIS — S065XAA Traumatic subdural hemorrhage with loss of consciousness status unknown, initial encounter: Secondary | ICD-10-CM | POA: Diagnosis not present

## 2023-08-22 DIAGNOSIS — D649 Anemia, unspecified: Secondary | ICD-10-CM

## 2023-08-22 DIAGNOSIS — I69354 Hemiplegia and hemiparesis following cerebral infarction affecting left non-dominant side: Secondary | ICD-10-CM | POA: Diagnosis not present

## 2023-08-22 DIAGNOSIS — N3000 Acute cystitis without hematuria: Secondary | ICD-10-CM | POA: Diagnosis present

## 2023-08-22 DIAGNOSIS — G40109 Localization-related (focal) (partial) symptomatic epilepsy and epileptic syndromes with simple partial seizures, not intractable, without status epilepticus: Secondary | ICD-10-CM | POA: Diagnosis present

## 2023-08-22 DIAGNOSIS — I482 Chronic atrial fibrillation, unspecified: Secondary | ICD-10-CM

## 2023-08-22 DIAGNOSIS — E1121 Type 2 diabetes mellitus with diabetic nephropathy: Secondary | ICD-10-CM

## 2023-08-22 DIAGNOSIS — I10 Essential (primary) hypertension: Secondary | ICD-10-CM

## 2023-08-22 DIAGNOSIS — E162 Hypoglycemia, unspecified: Secondary | ICD-10-CM | POA: Diagnosis not present

## 2023-08-22 DIAGNOSIS — D631 Anemia in chronic kidney disease: Secondary | ICD-10-CM | POA: Diagnosis present

## 2023-08-22 DIAGNOSIS — G9341 Metabolic encephalopathy: Secondary | ICD-10-CM | POA: Diagnosis present

## 2023-08-22 DIAGNOSIS — Z7901 Long term (current) use of anticoagulants: Secondary | ICD-10-CM | POA: Diagnosis not present

## 2023-08-22 DIAGNOSIS — I6201 Nontraumatic acute subdural hemorrhage: Secondary | ICD-10-CM | POA: Diagnosis not present

## 2023-08-22 DIAGNOSIS — B952 Enterococcus as the cause of diseases classified elsewhere: Secondary | ICD-10-CM | POA: Diagnosis present

## 2023-08-22 LAB — CBC
HCT: 32.3 % — ABNORMAL LOW (ref 39.0–52.0)
Hemoglobin: 10.1 g/dL — ABNORMAL LOW (ref 13.0–17.0)
MCH: 29.1 pg (ref 26.0–34.0)
MCHC: 31.3 g/dL (ref 30.0–36.0)
MCV: 93.1 fL (ref 80.0–100.0)
Platelets: 208 10*3/uL (ref 150–400)
RBC: 3.47 MIL/uL — ABNORMAL LOW (ref 4.22–5.81)
RDW: 15.2 % (ref 11.5–15.5)
WBC: 7.7 10*3/uL (ref 4.0–10.5)
nRBC: 0.3 % — ABNORMAL HIGH (ref 0.0–0.2)

## 2023-08-22 LAB — BASIC METABOLIC PANEL
Anion gap: 10 (ref 5–15)
BUN: 36 mg/dL — ABNORMAL HIGH (ref 8–23)
CO2: 28 mmol/L (ref 22–32)
Calcium: 8.7 mg/dL — ABNORMAL LOW (ref 8.9–10.3)
Chloride: 103 mmol/L (ref 98–111)
Creatinine, Ser: 1.65 mg/dL — ABNORMAL HIGH (ref 0.61–1.24)
GFR, Estimated: 46 mL/min — ABNORMAL LOW (ref >60)
Glucose, Bld: 135 mg/dL — ABNORMAL HIGH (ref 70–99)
Potassium: 4.1 mmol/L (ref 3.5–5.1)
Sodium: 141 mmol/L (ref 135–145)

## 2023-08-22 LAB — VALPROIC ACID LEVEL: Valproic Acid Lvl: 99 ug/mL (ref 50.0–100.0)

## 2023-08-22 LAB — HIV ANTIBODY (ROUTINE TESTING W REFLEX): HIV Screen 4th Generation wRfx: NONREACTIVE

## 2023-08-22 LAB — CBG MONITORING, ED: Glucose-Capillary: 104 mg/dL — ABNORMAL HIGH (ref 70–99)

## 2023-08-22 MED ORDER — ASPIRIN 81 MG PO TBEC: 81.0000 mg | DELAYED_RELEASE_TABLET | Freq: Every day | ORAL | Status: DC

## 2023-08-22 MED ORDER — VITAMIN D 25 MCG (1000 UNIT) PO TABS
1000.0000 [IU] | ORAL_TABLET | Freq: Every day | ORAL | Status: DC
  Administered 2023-08-24 – 2023-08-28 (×5): 1000 [IU] via ORAL
  Filled 2023-08-22 (×5): qty 1

## 2023-08-22 MED ORDER — FERROUS SULFATE 325 (65 FE) MG PO TABS
325.0000 mg | ORAL_TABLET | Freq: Every day | ORAL | Status: DC
  Administered 2023-08-24 – 2023-08-28 (×5): 325 mg via ORAL
  Filled 2023-08-22 (×5): qty 1

## 2023-08-22 MED ORDER — LEVETIRACETAM IN NACL 1000 MG/100ML IV SOLN
1000.0000 mg | Freq: Two times a day (BID) | INTRAVENOUS | Status: DC
  Administered 2023-08-22 – 2023-08-24 (×4): 1000 mg via INTRAVENOUS
  Filled 2023-08-22 (×5): qty 100

## 2023-08-22 MED ORDER — TAMSULOSIN HCL 0.4 MG PO CAPS
0.4000 mg | ORAL_CAPSULE | Freq: Every day | ORAL | Status: DC
  Administered 2023-08-24 – 2023-08-28 (×5): 0.4 mg via ORAL
  Filled 2023-08-22 (×5): qty 1

## 2023-08-22 MED ORDER — LEVETIRACETAM ER 500 MG PO TB24
1500.0000 mg | ORAL_TABLET | Freq: Two times a day (BID) | ORAL | Status: DC
  Filled 2023-08-22: qty 3

## 2023-08-22 MED ORDER — SODIUM CHLORIDE 0.9 % IV SOLN
200.0000 mg | Freq: Once | INTRAVENOUS | Status: AC
  Administered 2023-08-22: 200 mg via INTRAVENOUS
  Filled 2023-08-22: qty 20

## 2023-08-22 MED ORDER — LACOSAMIDE 50 MG PO TABS: 50.0000 mg | ORAL_TABLET | Freq: Two times a day (BID) | ORAL | Status: DC

## 2023-08-22 MED ORDER — APIXABAN 5 MG PO TABS
5.0000 mg | ORAL_TABLET | Freq: Two times a day (BID) | ORAL | Status: DC
  Administered 2023-08-22: 5 mg via ORAL
  Filled 2023-08-22: qty 1

## 2023-08-22 MED ORDER — ATORVASTATIN CALCIUM 20 MG PO TABS
80.0000 mg | ORAL_TABLET | Freq: Every day | ORAL | Status: DC
  Administered 2023-08-24 – 2023-08-28 (×5): 80 mg via ORAL
  Filled 2023-08-22 (×5): qty 4

## 2023-08-22 MED ORDER — LEVETIRACETAM IN NACL 1500 MG/100ML IV SOLN
1500.0000 mg | Freq: Two times a day (BID) | INTRAVENOUS | Status: DC
  Administered 2023-08-22: 1500 mg via INTRAVENOUS
  Filled 2023-08-22 (×2): qty 100

## 2023-08-22 MED ORDER — SODIUM CHLORIDE 0.9 % IV SOLN: INTRAVENOUS | Status: DC

## 2023-08-22 MED ORDER — MELATONIN 5 MG PO TABS
5.0000 mg | ORAL_TABLET | Freq: Every day | ORAL | Status: DC
  Administered 2023-08-22 – 2023-08-27 (×5): 5 mg via ORAL
  Filled 2023-08-22 (×5): qty 1

## 2023-08-22 MED ORDER — SERTRALINE HCL 50 MG PO TABS
75.0000 mg | ORAL_TABLET | Freq: Every day | ORAL | Status: DC
  Administered 2023-08-24 – 2023-08-28 (×5): 75 mg via ORAL
  Filled 2023-08-22 (×5): qty 2

## 2023-08-22 MED ORDER — DEXTROSE-SODIUM CHLORIDE 5-0.9 % IV SOLN: INTRAVENOUS | Status: AC

## 2023-08-22 NOTE — Plan of Care (Signed)
 Neurology plan of care  This is a 63 yo man well-known to our service who presents with breakthrough seizure in the setting of recently decreasing his keppra  dose. He currently takes 500mg  keppra  bid and VPA 750mg  bid. He received one dose of vimpat  this AM but this was discontinued after he was found to have LBBB (which vimpat  can worsen). Recommend increasing keppra  to 1000mg  bid and continuing VPA at current dose (level was 99 but not true trough). He already received a keppra  load this AM. He is slowly returning to his relatively poor baseline and there is not concern for ongoing nonconvulsive seizure. Head CT stable from prior. If any further neurologic concerns arise please formally consult neurology.   Elida Ross, MD [facility] [phone]  If 7pm- 7am, please page neurology on call as listed in AMION.]

## 2023-08-22 NOTE — Assessment & Plan Note (Addendum)
 Continue Lasix

## 2023-08-22 NOTE — Assessment & Plan Note (Addendum)
 Chronic disease stage IIIa.  Last hemoglobin A1c 5.5.

## 2023-08-22 NOTE — Assessment & Plan Note (Signed)
 As needed albuterol nebulizer

## 2023-08-22 NOTE — Assessment & Plan Note (Addendum)
 Left-sided paralysis.  Holding aspirin and Eliquis for right now.  Can go back on eliquis on 09/01/22.

## 2023-08-22 NOTE — Assessment & Plan Note (Addendum)
 Lipitor

## 2023-08-22 NOTE — ED Notes (Signed)
 Patient cleaned from urine incontinence and smear BM. Male pure wick placed due to the consistent incontinence and to low suction. Chucks and clean brief placed. Pt repositioned. CB placed at hand. VSS. Will continue to monitor.

## 2023-08-22 NOTE — Assessment & Plan Note (Addendum)
 -  Continue Lipitor

## 2023-08-22 NOTE — Progress Notes (Signed)
 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  Patient had breakthrough seizure was postictal last night.  1/1.  This morning I was able to speak with the patient but he was hard to understand.  Was able to move his right side.  Did not do too well with the nursing swallow test.  Speech therapy consult ordered.  Case discussed with neurology and will give IV Keppra  1000 mg twice a day and IV Depakote  for now.  Assessment and Plan: * Breakthrough seizure [facility]) Patient given 1500 mg of Keppra  last night and this morning.  Case discussed with neurology.  Looks like Keppra  dose recently decreased to 500 mg twice a day.  Will give IV Keppra  1000 mg twice a day.  Will continue IV Depakote .  Depakote  level higher end of normal.  Initially did give a dose of Vimpat  this morning but will not continue this medication.  CAD (coronary artery disease) Continue aspirin  and Lipitor   Chronic diastolic CHF (congestive heart failure) (HCC) No signs of heart failure.  Since patient is not eating, will give gentle fluid  Hyperlipidemia Lipitor  if able to take  COPD (chronic obstructive pulmonary disease) (HCC) As needed albuterol  nebulizer  Essential hypertension Hold Cozaar  and Lasix   Type II diabetes mellitus with renal manifestations (HCC) Chronic disease stage IIIa.  Last hemoglobin A1c 5.5.  Will give D5 NS until able to eat.  Atrial fibrillation, chronic (HCC) Continue Eliquis   Anemia, unspecified Check for tomorrow morning to further classify anemia.  History of stroke Left-sided paralysis.  On  aspirin  and Eliquis .        Subjective: Patient was admitted with breakthrough seizure.  Patient able to talk with me this morning but was difficult to understand.  Patient not able to move his left side.  Physical Exam: Vitals:   08/22/23 1000 08/22/23 1100 08/22/23 1130 08/22/23 1200  BP: (!) 147/62 (!) 164/54 (!) 157/55 (!) 151/59  Pulse: 79 83 83 78  Resp: 13 13 13 12   Temp:      TempSrc:      SpO2: 100% 100% 100% 100%  Weight:      Height:       Physical Exam HENT:     Head: Normocephalic.     Mouth/Throat:     Pharynx: No oropharyngeal exudate.  Eyes:     General: Lids are normal.  Cardiovascular:     Rate and Rhythm: Normal rate and regular rhythm.     Heart sounds: Normal heart sounds, S1 normal and S2 normal.  Pulmonary:     Breath sounds: No decreased breath sounds, wheezing, rhonchi or rales.  Abdominal:     Palpations: Abdomen is soft.     Tenderness: There is no abdominal tenderness.  Musculoskeletal:     Right lower leg: No swelling.     Left lower leg: No swelling.  Skin:    General: Skin is warm.     Findings: No rash.  Neurological:     Mental Status: He is alert.     Comments: Left-sided paralysis.  Able to move his right leg and arm.     Data Reviewed:  Creatinine 1.65, hemoglobin 10.1  Family Communication: Left message for brother this morning  Disposition: Status is: Inpatient Remains inpatient appropriate because: Patient did not do too well with swallowing this morning.  Will get speech evaluation and neurology consultation.  Planned Discharge Destination: Back to facility    Time spent: 28 minutes  Author: Charlie Patterson, MD 08/22/2023 12:24 PM  For on call review [email or web address].

## 2023-08-22 NOTE — Hospital Course (Addendum)
 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  Patient had breakthrough seizure was postictal last night.  1/1.  This morning I was able to speak with the patient but he was hard to understand.  Was able to move his right side.  Did not do too well with the nursing swallow test.  Speech therapy consult ordered.  Case discussed with neurology and will give IV Keppra  1000 mg twice a day and IV Depakote  for now. 1/2.  Patient lethargic with speech therapist and did not feel comfortable placing on a diet.  When I saw him he was able to answer few questions but kept his eyes closed.  EEG (negative for seizure) and MRI of the brain (showed 7 mm right occipital subdural hematoma).  CT scan of the head shows unchanged subdural hematoma.  Appreciate neurosurgery consultation.  Holding blood thinners. 1/3.  Mental status much improved.  Switch seizure medications to oral.  Placed on diet. 1/4.  Fingerstick 44 this morning.  Patient given juice.  Creatinine improved to 1.26. 1/5   Fingerstick a.m. 48, repeat fingerstick at 11:45 was 35 and glucose was replaced before I was notified to check my hypoglycemic labs. 1/6.  Fingerstick 46 this morning.  When they drew the hypoglycemic labs glucose on the chemistry was 78.  Not sure if this is reading correctly.  Insulin  and C-peptide and Sophia urea screen pending.  Advised he must eat an evening snack. 1/7.  Fingerstick at 5 am good.  FS at  7:21 a little low but asymptomatic.  Not sure if FS are accurate since he is asymptomatic.  They were late with breakfast.  Patient must eat and evening snack and have early breakfast. C-peptide and insulin  normal.

## 2023-08-22 NOTE — Assessment & Plan Note (Addendum)
 Mental status much improved since coming in.  Initially required IV medications and now on oral.  Keppra increased to 100mg  po bid.

## 2023-08-22 NOTE — ED Notes (Signed)
 Pts brief checked and found soiled. Pt had urinated and had a bowel movement. Pt cleaned up, brief changed, bed pad changed, and linens changed. Repositioned in bed.

## 2023-08-22 NOTE — Assessment & Plan Note (Addendum)
 No signs of heart failure.  Continue Lasix.

## 2023-08-22 NOTE — Progress Notes (Signed)
 OT Cancellation Note  Patient Details Name: Kiowa Hollar MRN: [id number] DOB: 1961-04-09   Cancelled Treatment:    Reason Eval/Treat Not Completed: Fatigue/lethargy limiting ability to participate. Consult received, chart reviewed. Pt unable to wake enough to participate with therapy, OT to re-attempt as able.    Arantza Darrington R., MPH, MS, OTR/L ascom [phone] 08/22/23, 4:07 PM

## 2023-08-22 NOTE — Assessment & Plan Note (Deleted)
 Check for tomorrow morning to further classify anemia.

## 2023-08-22 NOTE — Assessment & Plan Note (Addendum)
 Holding Eliquis

## 2023-08-22 NOTE — ED Notes (Signed)
 Patient resting without distress. Fall precautions maintained. CB in reach. Will continue to monitor.

## 2023-08-22 NOTE — Progress Notes (Signed)
 PT Cancellation Note  Patient Details Name: Jeremy Browning MRN: [id number] DOB: 1960-08-25   Cancelled Treatment:    Reason Eval/Treat Not Completed: Other (comment). Pt unable to wake enough to participate with therapy, PT to re-attempt as able.    Doyal Shams PT, DPT 3:36 PM,08/22/23

## 2023-08-23 ENCOUNTER — Inpatient Hospital Stay: Payer: Medicaid Other

## 2023-08-23 ENCOUNTER — Ambulatory Visit: Payer: Medicaid Other

## 2023-08-23 DIAGNOSIS — S065XAA Traumatic subdural hemorrhage with loss of consciousness status unknown, initial encounter: Secondary | ICD-10-CM

## 2023-08-23 DIAGNOSIS — D638 Anemia in other chronic diseases classified elsewhere: Secondary | ICD-10-CM | POA: Insufficient documentation

## 2023-08-23 DIAGNOSIS — G9341 Metabolic encephalopathy: Secondary | ICD-10-CM | POA: Diagnosis not present

## 2023-08-23 DIAGNOSIS — R569 Unspecified convulsions: Secondary | ICD-10-CM

## 2023-08-23 DIAGNOSIS — N3000 Acute cystitis without hematuria: Secondary | ICD-10-CM | POA: Diagnosis not present

## 2023-08-23 DIAGNOSIS — I2510 Atherosclerotic heart disease of native coronary artery without angina pectoris: Secondary | ICD-10-CM | POA: Diagnosis not present

## 2023-08-23 DIAGNOSIS — G40919 Epilepsy, unspecified, intractable, without status epilepticus: Secondary | ICD-10-CM | POA: Diagnosis not present

## 2023-08-23 LAB — URINALYSIS, W/ REFLEX TO CULTURE (INFECTION SUSPECTED)
Bilirubin Urine: NEGATIVE
Glucose, UA: NEGATIVE mg/dL
Hgb urine dipstick: NEGATIVE
Ketones, ur: NEGATIVE mg/dL
Nitrite: NEGATIVE
Protein, ur: NEGATIVE mg/dL
Specific Gravity, Urine: 1.028 (ref 1.005–1.030)
pH: 5 (ref 5.0–8.0)

## 2023-08-23 LAB — BASIC METABOLIC PANEL
Anion gap: 12 (ref 5–15)
BUN: 35 mg/dL — ABNORMAL HIGH (ref 8–23)
CO2: 23 mmol/L (ref 22–32)
Calcium: 8.6 mg/dL — ABNORMAL LOW (ref 8.9–10.3)
Chloride: 105 mmol/L (ref 98–111)
Creatinine, Ser: 1.6 mg/dL — ABNORMAL HIGH (ref 0.61–1.24)
GFR, Estimated: 48 mL/min — ABNORMAL LOW (ref >60)
Glucose, Bld: 153 mg/dL — ABNORMAL HIGH (ref 70–99)
Potassium: 3.7 mmol/L (ref 3.5–5.1)
Sodium: 140 mmol/L (ref 135–145)

## 2023-08-23 LAB — CBC
HCT: 30.8 % — ABNORMAL LOW (ref 39.0–52.0)
Hemoglobin: 9.5 g/dL — ABNORMAL LOW (ref 13.0–17.0)
MCH: 28.9 pg (ref 26.0–34.0)
MCHC: 30.8 g/dL (ref 30.0–36.0)
MCV: 93.6 fL (ref 80.0–100.0)
Platelets: 150 10*3/uL (ref 150–400)
RBC: 3.29 MIL/uL — ABNORMAL LOW (ref 4.22–5.81)
RDW: 15 % (ref 11.5–15.5)
WBC: 7.5 10*3/uL (ref 4.0–10.5)
nRBC: 0 % (ref 0.0–0.2)

## 2023-08-23 LAB — LEVETIRACETAM LEVEL: Levetiracetam Lvl: 53.6 ug/mL — ABNORMAL HIGH (ref 10.0–40.0)

## 2023-08-23 LAB — FERRITIN: Ferritin: 236 ng/mL (ref 24–336)

## 2023-08-23 LAB — CBG MONITORING, ED: Glucose-Capillary: 91 mg/dL (ref 70–99)

## 2023-08-23 MED ORDER — SODIUM CHLORIDE 0.9 % IV SOLN
1.0000 g | INTRAVENOUS | Status: DC
  Administered 2023-08-23 – 2023-08-24 (×2): 1 g via INTRAVENOUS
  Filled 2023-08-23 (×2): qty 10

## 2023-08-23 MED ORDER — DEXTROSE-SODIUM CHLORIDE 5-0.9 % IV SOLN: INTRAVENOUS | Status: DC

## 2023-08-23 NOTE — Progress Notes (Signed)
 MRI shows a subdural hematoma.  Patient has not been awake enough last 2 days in order to take his anticoagulation but I will stop Eliquis  and aspirin  at this point.  EEG did not show any seizure.  Case discussed with neurosurgery and neurology.  Stopped Eliquis  and aspirin .  Teds and SCDs for DVT prophylaxis.  Updated patient's brother on the phone  Dr. Charlie Patterson

## 2023-08-23 NOTE — ED Notes (Signed)
 This RN attempted a swallow screen on this pt. Pt was not able to pull water up in to straw despite several attempts. He was able to seal lips around straw but not able to pull the liquid up into straw.

## 2023-08-23 NOTE — ED Notes (Signed)
 Pt is asleep upon assessment. Equal rise and fall of chest noted. NAD. CB within  reach. Bed locked and in lowest position.

## 2023-08-23 NOTE — Progress Notes (Signed)
 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  Patient had breakthrough seizure was postictal last night.  1/1.  This morning I was able to speak with the patient but he was hard to understand.  Was able to move his right side.  Did not do too well with the nursing swallow test.  Speech therapy consult ordered.  Case discussed with neurology and will give IV Keppra  1000 mg twice a day and IV Depakote  for now. 1/2.  Patient lethargic with speech therapist and did not feel comfortable placing on a diet.  When I saw him he was able to answer few questions but kept his eyes closed.  Case discussed with neurology will get EEG and MRI of the brain.  Assessment and Plan: * Breakthrough seizure (HCC) Continue IV Keppra  and IV Depakote .  Since 2 lethargic to place on a diet we will get an MRI of the brain and EEG.  Case discussed with neurology.  Acute cystitis without hematuria Will start empiric Rocephin  and follow-up culture.  Acute metabolic encephalopathy Not sure if secondary to seizure will get MRI of the brain and EEG.  Will start antibiotics for urinary infection.  CAD (coronary artery disease) Continue aspirin  and Lipitor   Chronic diastolic CHF (congestive heart failure) (HCC) No signs of heart failure.  Since patient is not eating, will give gentle fluid  Hyperlipidemia Lipitor  if able to take  COPD (chronic obstructive pulmonary disease) (HCC) As needed albuterol  nebulizer  Essential hypertension Hold Cozaar  and Lasix   Type II diabetes mellitus with renal manifestations  (HCC) Chronic disease stage IIIa.  Last hemoglobin A1c 5.5.  Will give D5 NS until able to eat.  Atrial fibrillation, chronic (HCC) Continue Eliquis   Stage 3a chronic kidney disease (HCC) Creatinine 1.6 with a GFR 48  Anemia of chronic disease Hemoglobin 9.5 and ferritin 236  History of stroke Left-sided paralysis.  On aspirin  and Eliquis .        Subjective: Patient answered a few questions but kept his eyes closed.  He was able to squeeze my hand on the right side.  Too lethargic for speech therapist to place on a diet.  Physical Exam: Vitals:   08/23/23 0718 08/23/23 0800 08/23/23 0828 08/23/23 0930  BP:  (!) 151/62 (!) 155/60 (!) 131/99  Pulse:  (!) 59 69 73  Resp:  15 13 17   Temp:   98.4 F (36.9 C)   TempSrc:   Oral   SpO2: 100% 100% 100% 100%  Weight:      Height:       Physical Exam HENT:     Head: Normocephalic.     Mouth/Throat:     Pharynx: No oropharyngeal exudate.  Eyes:     General: Lids are normal.  Cardiovascular:     Rate and Rhythm: Normal rate and regular rhythm.     Heart sounds: S1 normal and S2 normal. Murmur heard.     Systolic murmur is present with a grade of 2/6.  Pulmonary:     Breath sounds: No decreased breath sounds, wheezing, rhonchi or rales.  Abdominal:     Palpations: Abdomen is soft.  Tenderness: There is no abdominal tenderness.  Musculoskeletal:     Right lower leg: No swelling.     Left lower leg: No swelling.  Skin:    General: Skin is warm.     Findings: No rash.  Neurological:     Mental Status: He is lethargic.     Comments: Left-sided paralysis.  Able to squeeze my hand with his right hand..     Data Reviewed: Urine analysis positive for white blood cells and leukocytes Creatinine 1.6, sodium 140, ferritin 236, hemoglobin 9.5  Family Communication: Updated brother on the phone  Disposition: Status is: Inpatient Remains inpatient appropriate because: Too lethargic to be placed on a diet.  Start IV  Rocephin  for acute cystitis.  MRI of the brain and EEG  Planned Discharge Destination: Back to his facility    Time spent: 28 minutes  Author: Charlie Patterson, MD 08/23/2023 12:08 PM  For on call review [email or web address].

## 2023-08-23 NOTE — ED Notes (Signed)
 Patient sleeping without signs of distress. Will continue to monitor.

## 2023-08-23 NOTE — Evaluation (Signed)
 Physical Therapy Evaluation Patient Details Name: Jeremy Browning MRN: [id number] DOB: 10/22/1960 Today's Date: 08/23/2023  History of Present Illness  Pt is a 63 y.o. 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   Clinical Impression  Pt lethargic throughout, eyes closed except momentarily here and there in the session. Did speak more to therapists at end of session after being mobilized stated things like oh my god ask my brother stated his brother's name. Unable to provide and PLOF; PT contacted facility that reported at baseline the pt is able to intermittently participate in a two person stand pivot to [facility] with cane (sometimes totalAx2). 1 assist for bed mobility. The pt required totalAx2 for all mobility this AM, able to demonstrate one righting reaction in sitting but primarily needed maxA to maintain midline (also challenged by elevated gurney). Returned to supine and repositioned for comfort.  Overall the patient demonstrated deficits (see PT Problem List) that impede the patient's functional abilities, safety, and mobility and would benefit from skilled PT intervention.          If plan is discharge home, recommend the following: Two people to help with walking and/or transfers;Two people to help with bathing/dressing/bathroom;Direct supervision/assist for medications management;Help with stairs or ramp for entrance;Assist for transportation;Assistance with feeding;Assistance with cooking/housework;Supervision due to cognitive status   Can travel by private vehicle        Equipment Recommendations None recommended by PT  Recommendations for Other Services       Functional Status Assessment Patient has had a recent decline in their functional status and demonstrates the ability to make significant improvements in  function in a reasonable and predictable amount of time.     Precautions / Restrictions Precautions Precautions: Fall Precaution Comments: seizures Restrictions Weight Bearing Restrictions Per Provider Order: No      Mobility  Bed Mobility Overal bed mobility: Needs Assistance Bed Mobility: Supine to Sit, Sit to Supine     Supine to sit: Total assist, +2 for physical assistance, HOB elevated Sit to supine: Total assist, +2 for physical assistance        Transfers                        Ambulation/Gait                  Stairs            Wheelchair Mobility     Tilt Bed    Modified Rankin (Stroke Patients Only)       Balance Overall balance assessment: Needs assistance Sitting-balance support: Feet supported Sitting balance-Leahy Scale: Zero Sitting balance - Comments: several attempts to check pt righting reactions, able to do once but fatigued very quickly. also challenged by elevated gurney height                                     Pertinent Vitals/Pain Pain Assessment Pain Assessment: Faces Pain Location: generalized Pain Descriptors / Indicators: Grimacing, Guarding, Moaning Pain Intervention(s): Limited activity within patient's tolerance, Monitored during session, Repositioned    Home Living Family/patient expects to be discharged to:: Skilled nursing facility                   Additional  Comments: LTC. PT spoke with facility pt unable to provide PLOF.    Prior Function               Mobility Comments: per facility pt is a two person transfer to [facility], and on good days he is able to assist by weight bearing through his legs and use a cane. 1 assist for bed mobility, needs set up for meals assistance with dressing, incontinent in diaper and needs assist to changes       Extremity/Trunk Assessment   Upper Extremity Assessment Upper Extremity Assessment: Defer to OT evaluation    Lower Extremity  Assessment Lower Extremity Assessment: Generalized weakness (noted for increased tone in LLE, RLE hip/knee flexion full. able to move RLE toes on command)       Communication      Cognition Arousal: Lethargic Behavior During Therapy: Flat affect Overall Cognitive Status: No family/caregiver present to determine baseline cognitive functioning                                 General Comments: pt spoke minimally to therapist, unable to provide PLOF. stated oh my god several times. did say to ask his brother where he lived        General Comments      Exercises     Assessment/Plan    PT Assessment Patient needs continued PT services  PT Problem List Decreased strength;Decreased activity tolerance;Decreased balance       PT Treatment Interventions DME instruction;Balance training;Gait training;Neuromuscular re-education;Stair training;Functional mobility training;Patient/family education;Therapeutic activities;Therapeutic exercise    PT Goals (Current goals can be found in the Care Plan section)  Acute Rehab PT Goals PT Goal Formulation: Patient unable to participate in goal setting Time For Goal Achievement: 09/06/23 Potential to Achieve Goals: Poor    Frequency Min 1X/week     Co-evaluation               AM-PAC PT 6 Clicks Mobility  Outcome Measure Help needed turning from your back to your side while in a flat bed without using bedrails?: Total Help needed moving from lying on your back to sitting on the side of a flat bed without using bedrails?: Total Help needed moving to and from a bed to a chair (including a wheelchair)?: Total Help needed standing up from a chair using your arms (e.g., wheelchair or bedside chair)?: Total Help needed to walk in hospital room?: Total Help needed climbing 3-5 steps with a railing? : Total 6 Click Score: 6    End of Session   Activity Tolerance: Patient limited by lethargy Patient left: in bed;with call  bell/phone within reach Nurse Communication: Mobility status PT Visit Diagnosis: Other abnormalities of gait and mobility (R26.89);Difficulty in walking, not elsewhere classified (R26.2);Muscle weakness (generalized) (M62.81)    Time: 9077-9065 PT Time Calculation (min) (ACUTE ONLY): 12 min   Charges:   PT Evaluation $PT Eval Low Complexity: 1 Low   PT General Charges $$ ACUTE PT VISIT: 1 Visit         Doyal Shams PT, DPT 10:28 AM,08/23/23

## 2023-08-23 NOTE — Progress Notes (Signed)
 Unable to complete admission profile at this time. Pt lethargy.

## 2023-08-23 NOTE — Procedures (Signed)
 Routine EEG Report  Jeremy Browning is a 63 y.o. male with a history of seizure who is undergoing an EEG to evaluate for seizures.  Report: This EEG was acquired with electrodes placed according to the International 10-20 electrode system (including Fp1, Fp2, F3, F4, C3, C4, P3, P4, O1, O2, T3, T4, T5, T6, A1, A2, Fz, Cz, Pz). The following electrodes were missing or displaced: none.  The occipital dominant rhythm was 6-7 Hz. This activity is reactive to stimulation. Drowsiness was manifested by background fragmentation; deeper stages of sleep were identified by K complexes and sleep spindles. There was right focal slowing. There were no interictal epileptiform discharges. There were no electrographic seizures identified. There was no abnormal response to photic stimulation or hyperventilation.   Impression and clinical correlation: This EEG was obtained while awake and asleep and is abnormal due to mild-to-moderate diffuse slowing indicative of global cerebral dysfunction. There is additional focal slowing seen over the right hemisphere in the region of his prior infarct. Epileptiform abnormalities were not seen during this recording.  Elida Ross, MD [facility] [phone]  If 7pm- 7am, please page neurology on call as listed in AMION.

## 2023-08-23 NOTE — Progress Notes (Signed)
 SLP Cancellation Note  Patient Details Name: Jeremy Browning MRN: [id number] DOB: 04/10/1961   Cancelled treatment:       Reason Eval/Treat Not Completed: Fatigue/lethargy limiting ability to participate SLP attempted to rouse pt with thermal and tactile stimulation without increased in alertness. Pt with eyes closed for duration of session. RN reporting minimal alertness. SLP to hold BSE. SLP will follow up as pt is available and schedule allows. MD made aware of plan.  Liliahna Cudd Clapp MS [facility] SLP    Jahari Wiginton J Clapp 08/23/2023, 8:57 AM

## 2023-08-23 NOTE — [place] FL2 (Addendum)
 [place]  MEDICAID FL2 LEVEL OF CARE FORM     IDENTIFICATION  Patient Name: Jeremy Browning Birthdate: [birth year] Sex: male Admission Date (Current Location): 08/21/2023  [place] and [place] Number:  Chiropodist and Address:  [facility], [address] [place], [place] [address]      Provider Number: [id number]  Attending Physician Name and Address:  Josette Ade, MD  Relative Name and Phone Number:       Current Level of Care: Hospital Recommended Level of Care: Skilled Nursing Facility Prior Approval Number:    Date Approved/Denied:   PASRR Number: [id number]  Discharge Plan: SNF    Current Diagnoses: Patient Active Problem List   Diagnosis Date Noted   Acute cystitis without hematuria 08/23/2023   Acute metabolic encephalopathy 08/23/2023   Anemia of chronic disease 08/23/2023   History of stroke 08/22/2023   Chronic diastolic CHF (congestive heart failure) (HCC) 08/21/2023   Type II diabetes mellitus with renal manifestations (HCC) 08/21/2023   CAD (coronary artery disease) 08/21/2023   Atrial fibrillation, chronic (HCC)    Sinus bradycardia 11/22/2022   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) 04/19/2022   Hyperkalemia 04/19/2022   Thrombocytopenia (HCC) 04/19/2022   Aspiration pneumonia (HCC) 03/13/2022   Seizures (HCC) 03/08/2022   Seizure (HCC) 03/08/2022   Status epilepticus (HCC)    Hypotension due to drugs    Subacute osteomyelitis of left tibia (HCC)    Hardware complicating wound infection (HCC)    Acute on chronic HFrEF (heart failure with reduced ejection fraction) (HCC)    Breakthrough seizure (HCC) 11/07/2021   NSTEMI (non-ST elevated myocardial infarction) (HCC) 11/07/2021   Stage 3a chronic kidney disease (HCC) 11/07/2021   Seizure disorder as sequela of cerebrovascular accident (HCC) 11/07/2021   Demand ischemia (HCC)    Status post open reduction and  internal fixation (ORIF) of fracture 09/07/2021   Medication monitoring encounter 08/11/2021   Draining cutaneous sinus tract 08/11/2021   Chronic osteomyelitis (HCC) 08/11/2021   Morbidly obese (HCC) 05/14/2021   OSA (obstructive sleep apnea) 05/14/2021   Complete left bundle branch block (LBBB) 05/14/2021   Chronic venous insufficiency -Left Leg > Right. 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   Essential hypertension 02/16/2021   Pressure injury of skin 02/10/2021   Oropharyngeal dysphagia    HFrEF (heart failure with reduced ejection fraction) (HCC)    h/o Cardiopulmonary arrest with successful resuscitation (HCC) 12/25/2020   Acute respiratory failure with hypoxia (HCC) 12/25/2020   AKI (acute kidney injury) (HCC) 12/25/2020   Wenckebach second degree AV block 12/25/2020   Lactic acidosis 12/25/2020   Delayed surgical wound healing 06/23/2020   Skin ulcer of knee, left, limited to breakdown of skin (HCC) 06/23/2020   BMI 40.0-44.9, adult (HCC) 12/12/2019   Left ventricular dyssynchrony 09/14/2018   Hyperlipidemia 09/13/2018   Tobacco abuse 02/07/2017   Controlled type 2 diabetes mellitus without complication, without long-term current use of insulin  (HCC) 06/15/2016   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  Currently NPO.)  AMBULATORY STATUS COMMUNICATION OF NEEDS Skin     Verbally Normal                       Personal Care Assistance Level of Assistance              Functional Limitations Info  Sight, Hearing, Speech Sight Info: Adequate Hearing Info: Adequate Speech Info: Impaired (Slurred/dysarthria)    SPECIAL CARE FACTORS FREQUENCY  PT (By licensed PT), OT (By licensed OT)     PT Frequency: Return to LTC with some follow up therapy OT Frequency: Return to LTC with some follow up therapy            Contractures Contractures Info: Present    Additional Factors Info  Code Status, Allergies Code Status Info: Full code Allergies Info: NKDA           Current Medications (08/23/2023):  This is the current hospital active medication list Current Facility-Administered Medications  Medication Dose Route Frequency Provider Last Rate Last Admin   acetaminophen  (TYLENOL ) tablet 650 mg  650 mg Oral Q6H PRN Niu, Xilin, MD       albuterol  (PROVENTIL ) (2.5 MG/3ML) 0.083% nebulizer solution 2.5 mg  2.5 mg Nebulization Q4H PRN Niu, Xilin, MD       apixaban  (ELIQUIS ) tablet 5 mg  5 mg Oral BID Josette Ade, MD   5 mg at 08/22/23 2144   aspirin  EC tablet 81 mg  81 mg Oral Daily Wieting, Ade, MD       atorvastatin  (LIPITOR ) tablet 80 mg  80 mg Oral Daily Wieting, Richard, MD       cefTRIAXone  (ROCEPHIN ) 1 g in sodium chloride  0.9 % 100 mL IVPB  1 g Intravenous Q24H Wieting, Richard, MD       cholecalciferol  (VITAMIN D3) 25 MCG (1000 UNIT) tablet 1,000 Units  1,000 Units Oral Daily Wieting, Richard, MD       dextromethorphan -guaiFENesin  (MUCINEX  DM) 30-600 MG per 12 hr tablet 1 tablet  1 tablet Oral BID PRN Niu, Xilin, MD       dextrose  5 %-0.9 % sodium chloride  infusion   Intravenous Continuous Josette Ade, MD 40 mL/hr at 08/22/23 2304 Rate Verify at 08/22/23 2304   ferrous sulfate  tablet 325 mg  325 mg Oral Q breakfast Wieting, Richard, MD       hydrALAZINE   (APRESOLINE ) injection 5 mg  5 mg Intravenous Q2H PRN Niu, Xilin, MD       levETIRAcetam  (KEPPRA ) IVPB 1000 mg/100 mL premix  1,000 mg Intravenous Q12H Josette Ade, MD   Stopped at 08/23/23 9146   LORazepam  (ATIVAN ) injection 2 mg  2 mg Intravenous Q2H PRN Niu, Xilin, MD       melatonin tablet 5 mg  5 mg Oral QHS Wieting, Richard, MD   5 mg at 08/22/23 2144   ondansetron  (ZOFRAN ) injection 4 mg  4 mg Intravenous Q8H PRN Niu, Xilin, MD       sertraline  (ZOLOFT ) tablet 75 mg  75 mg Oral Daily Wieting, Richard, MD       tamsulosin  (FLOMAX ) capsule 0.4 mg  0.4 mg Oral Daily Wieting, Richard, MD       valproate (DEPACON ) 750 mg in dextrose  5 % 50 mL IVPB  750 mg Intravenous Q12H Niu, Xilin,  MD   Stopped at 08/23/23 1056   Current Outpatient Medications  Medication Sig Dispense Refill   apixaban  (ELIQUIS ) 5 MG TABS tablet Take 5 mg by mouth 2 (two) times daily.     aspirin  EC 81 MG tablet Take 81 mg by mouth daily.     atorvastatin  (LIPITOR ) 80 MG tablet Take 80 mg by mouth daily.     Carboxymethylcellulose Sod PF 1 % GEL Apply in both eyes in the morning and at bedtime.     cholecalciferol  (VITAMIN D3) 25 MCG (1000 UNIT) tablet Take 1,000 Units by mouth daily.     diazepam  (VALIUM ) 5 MG/ML injection Inject 5 mg into the vein daily as needed.     divalproex  (DEPAKOTE ) 250 MG DR tablet Take 3 tablets (750 mg total) by mouth 2 (two) times daily.     ferrous sulfate  325 (65 FE) MG EC tablet Take 325 mg by mouth daily with breakfast.     furosemide  (LASIX ) 20 MG tablet Take 40 mg by mouth daily.     levETIRAcetam  (KEPPRA ) 500 MG tablet Take 500 mg by mouth 2 (two) times daily.     losartan  (COZAAR ) 25 MG tablet Take 25 mg by mouth daily.     melatonin 5 MG TABS Take 5 mg by mouth at bedtime.     sertraline  (ZOLOFT ) 25 MG tablet Take 75 mg by mouth daily.     tamsulosin  (FLOMAX ) 0.4 MG CAPS capsule Take 0.4 mg by mouth daily.       Discharge Medications: Please see discharge summary for a list  of discharge medications.  Relevant Imaging Results:  Relevant Lab Results:   Additional Information SS#: [id number]  Lauraine [name], LCSW

## 2023-08-23 NOTE — Assessment & Plan Note (Addendum)
 Improved from admission.  Unclear if this was secondary to subdural hematoma, breakthrough seizure or UTI.

## 2023-08-23 NOTE — TOC Initial Note (Signed)
 Transition of Care [facility]) - Initial/Assessment Note    Patient Details  Name: Jeremy Browning MRN: [id number] Date of Birth: [birth year]  [facility] CM/SW Contact:    Jeremy [name], LCSW Phone Number: 08/23/2023, 12:19 PM  Clinical Narrative:   CSW went by patients room but he was not in there. Per chart review, patient admitted from [facility] SNF. CSW called admissions coordinator and confirmed he is a long-term resident. They are waiting on a call back from his family regarding a bed hold. No further concerns. CSW will continue to follow patient for support and facilitate return to SNF once stable.               Expected Discharge Plan: Skilled Nursing Facility Barriers to Discharge: Continued Medical Work up   Patient Goals and CMS Choice            Expected Discharge Plan and Services       Living arrangements for the past 2 months: Skilled Nursing Facility                                      Prior Living Arrangements/Services Living arrangements for the past 2 months: Skilled Nursing Facility Lives with:: Facility Resident Patient language and need for interpreter reviewed:: Yes        Need for Family Participation in Patient Care: Yes (Comment) Care giver support system in place?: Yes (comment)   Criminal Activity/Legal Involvement Pertinent to Current Situation/Hospitalization: No - Comment as needed  Activities of Daily Living      Permission Sought/Granted                  Emotional Assessment Appearance:: Appears stated age     Orientation: : Oriented to Self, Oriented to Place, Oriented to  Time, Oriented to Situation Alcohol  / Substance Use: Not Applicable Psych Involvement: No (comment)  Admission diagnosis:  Breakthrough seizure (HCC) [G40.919] Seizure (HCC) [R56.9] Patient Active Problem List   Diagnosis Date Noted   Acute cystitis without hematuria 08/23/2023   Acute metabolic encephalopathy 08/23/2023    Anemia of chronic disease 08/23/2023   History of stroke 08/22/2023   Chronic diastolic CHF (congestive heart failure) (HCC) 08/21/2023   Type II diabetes mellitus with renal manifestations (HCC) 08/21/2023   CAD (coronary artery disease) 08/21/2023   Atrial fibrillation, chronic (HCC)    Sinus bradycardia 11/22/2022   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) 04/19/2022   Hyperkalemia 04/19/2022   Thrombocytopenia (HCC) 04/19/2022   Aspiration pneumonia (HCC) 03/13/2022   Seizures (HCC) 03/08/2022   Seizure (HCC) 03/08/2022   Status epilepticus (HCC)    Hypotension due to drugs    Subacute osteomyelitis of left tibia (HCC)    Hardware complicating wound infection (HCC)    Acute on chronic HFrEF (heart failure with reduced ejection fraction) (HCC)    Breakthrough seizure (HCC) 11/07/2021   NSTEMI (non-ST elevated myocardial infarction) (HCC) 11/07/2021   Stage 3a chronic kidney disease (HCC) 11/07/2021   Seizure disorder as sequela of cerebrovascular accident (HCC) 11/07/2021   Demand ischemia (HCC)    Status post open reduction and internal fixation (ORIF) of fracture 09/07/2021   Medication monitoring encounter 08/11/2021   Draining cutaneous sinus tract 08/11/2021   Chronic osteomyelitis (HCC) 08/11/2021   Morbidly obese (HCC) 05/14/2021   OSA (obstructive sleep apnea) 05/14/2021   Complete  left bundle branch block (LBBB) 05/14/2021   Chronic venous insufficiency -Left Leg > Right. 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   Essential hypertension 02/16/2021   Pressure injury of skin 02/10/2021   Oropharyngeal dysphagia    HFrEF (heart failure with reduced ejection fraction) (HCC)    h/o Cardiopulmonary arrest with successful resuscitation (HCC) 12/25/2020   Acute respiratory failure with hypoxia (HCC) 12/25/2020   AKI (acute kidney injury) (HCC) 12/25/2020   Wenckebach second degree AV block 12/25/2020   Lactic acidosis 12/25/2020    Delayed surgical wound healing 06/23/2020   Skin ulcer of knee, left, limited to breakdown of skin (HCC) 06/23/2020   BMI 40.0-44.9, adult (HCC) 12/12/2019   Left ventricular dyssynchrony 09/14/2018   Hyperlipidemia 09/13/2018   Tobacco abuse 02/07/2017   Controlled type 2 diabetes mellitus without complication, without long-term current use of insulin  (HCC) 06/15/2016   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 [place] [address] Phone: [phone] Fax: [phone]     Social Drivers of Health (SDOH) Social History: SDOH Screenings   Food Insecurity: No Food Insecurity (12/26/2019)   Received from [facility], [facility]  Transportation Needs: Unmet Transportation Needs (12/26/2019)   Received from [facility], [facility]  Depression [phone]): Low Risk  (09/16/2021)  Financial Resource Strain: Low Risk  (12/26/2019)   Received from [facility], [facility]  Physical Activity: Insufficiently Active (12/26/2019)   Received from [facility], [facility]  Social Connections: Moderately Isolated (12/26/2019)   Received from [facility], [facility]  Stress: Stress Concern Present (12/26/2019)   Received from [facility], [facility]  Tobacco Use: Medium Risk (08/21/2023)   SDOH Interventions:     Readmission Risk Interventions    08/23/2023    12:19 PM  Readmission Risk Prevention Plan  Transportation Screening Complete  PCP or Specialist Appt within 5-7 Days Complete  Medication Review (RN CM) Complete

## 2023-08-23 NOTE — Consult Note (Signed)
 Consult requested by:  Dr. Josette  Consult requested for:  Subdural hematoma  Primary Physician:  Fleeta Valeria Mayo, MD  History of Present Illness: 08/23/2023 Jeremy Browning is here today with a chief complaint of breakthrough seizure after lowering his seizure medication.  He has significant history of seizure disorder as well as strokes.  He is on anticoagulation for atrial fibrillation.  He reports a mild headache.  He has no other complaints.  He is unable to give a full history.  I have utilized the care everywhere function in epic to review the outside records available from external health systems.  Review of Systems:  Unobtainable from the patient Past Medical History: Past Medical History:  Diagnosis Date   CHF (congestive heart failure) (HCC)    Chronic atrial fibrillation (HCC)    Diabetes mellitus type II, non insulin  dependent (HCC)    Essential hypertension [name]   h/o Proteus pneumonia (HCC) 02/17/2021   Heart failure (HCC)    Unknown details.  By report no cardiac surgery   Seizures (HCC)    Severe hypoxic-ischemic encephalopathy    Sleep apnea    Stroke [facility])    Venous stasis dermatitis of left lower extremity     Past Surgical History: Past Surgical History:  Procedure Laterality Date   HARDWARE REMOVAL Left 02/10/2022   Procedure: HARDWARE REMOVAL LEFT TIBIA;  Surgeon: Harden Jerona GAILS, MD;  Location: [facility] OR;  Service: Orthopedics;  Laterality: Left;   I & D EXTREMITY Left 02/10/2022   Procedure: DEBRIDEMENT OF LEFT TIBIA;  Surgeon: Harden Jerona GAILS, MD;  Location: [facility] OR;  Service: Orthopedics;  Laterality: Left;   LEFT HEART CATH AND CORONARY ANGIOGRAPHY N/A 12/25/2020   Procedure: LEFT HEART CATH AND CORONARY ANGIOGRAPHY;  Surgeon: Anner Alm ORN, MD;  Location: [facility] INVASIVE CV LAB;  Service: Cardiovascular;  Laterality: N/A; (In setting of Acute Hypoxic and Hypercapnic Respiratory Failure with Flash Pulm Edema/Hypertensive Emergency) -  Angiographically minimal CAD.  Moderately reduced LV function with EF of 35 to 45%. LV P-EDP 101/21 mmHg - 30 mmHg   LEFT HEART CATH AND CORONARY ANGIOGRAPHY  09/12/2018   [facility]): Ostial LAD ~20% otherwise normal coronary arteries. LVEDP 20 mmHg;- HTN Emergency related Type II MI>   PEG PLACEMENT N/A 01/11/2021   Procedure: PERCUTANEOUS ENDOSCOPIC GASTROSTOMY (PEG) PLACEMENT;  Surgeon: Jinny Carmine, MD;  Location: [facility] ENDOSCOPY;  Service: Endoscopy;  Laterality: N/A;   RIGHT HEART CATH N/A 12/25/2020   Procedure: RIGHT HEART CATH;  Surgeon: Anner Alm ORN, MD;  Location: [facility] INVASIVE CV LAB;  Service: Cardiovascular; R IJ: (Acute Hypoxic and Hypercapnic Respiratory Failure with Flash Pulm Edema/Hypertensive Emergency): CO-CI 6.54-2.82. Mild Secondary Pulmonary Hypertension (PCWP 30 mmHg.  PAP 47/13 mmHg with a mean PAP of 33 mmHg, RAP 22 mmHg in setting of AoP-MAP 97/59 mmHg - 71 mmHg,   TRACHEOSTOMY TUBE PLACEMENT N/A 01/12/2021   Procedure: TRACHEOSTOMY;  Surgeon: Milissa Hamming, MD;  Location: [facility] ORS;  Service: ENT;  Laterality: N/A;   TRANSTHORACIC ECHOCARDIOGRAM  09/11/2018   [facility]: Moderately reduced LVEF ~35%. 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 Mild-mod AI, Trivial MR?TR. LVEDD 6.0 (reduced compared to 2017: EF ~40-45%, LVEDD 5.6   TRANSTHORACIC ECHOCARDIOGRAM  12/27/2020   [facility]: (In Setting of Acute Hypoxic and Hypercapnic Respiratory Failure with Flash Pulm Edema/Hypertensive Emergency)  EF 45-50%. Mildly reduced LVEF with global HK. Moderately dilated LV with Gr 1 DD - mild LA dilation. Normal RV size Fxn.  Normal Vavles   TRANSTHORACIC ECHOCARDIOGRAM  03/07/2021   [facility]: EF improved to 60-65% with Nl LV Fxn & ~ normal diastolic fxn. Normal RV size & fxn. Mild MR & AI.  Normal LA size.   Unknown      Allergies: Allergies as of 08/21/2023   (No Known Allergies)    Medications: Current Meds  Medication Sig   apixaban  (ELIQUIS ) 5 MG TABS tablet Take 5 mg  by mouth 2 (two) times daily.   aspirin  EC 81 MG tablet Take 81 mg by mouth daily.   atorvastatin  (LIPITOR ) 80 MG tablet Take 80 mg by mouth daily.   Carboxymethylcellulose Sod PF 1 % GEL Apply in both eyes in the morning and at bedtime.   cholecalciferol  (VITAMIN D3) 25 MCG (1000 UNIT) tablet Take 1,000 Units by mouth daily.   diazepam  (VALIUM ) 5 MG/ML injection Inject 5 mg into the vein daily as needed.   divalproex  (DEPAKOTE ) 250 MG DR tablet Take 3 tablets (750 mg total) by mouth 2 (two) times daily.   ferrous sulfate  325 (65 FE) MG EC tablet Take 325 mg by mouth daily with breakfast.   furosemide  (LASIX ) 20 MG tablet Take 40 mg by mouth daily.   levETIRAcetam  (KEPPRA ) 500 MG tablet Take 500 mg by mouth 2 (two) times daily.   losartan  (COZAAR ) 25 MG tablet Take 25 mg by mouth daily.   melatonin 5 MG TABS Take 5 mg by mouth at bedtime.   sertraline  (ZOLOFT ) 25 MG tablet Take 75 mg by mouth daily.   tamsulosin  (FLOMAX ) 0.4 MG CAPS capsule Take 0.4 mg by mouth daily.    Social History: Social History   Tobacco Use   Smoking status: Former    Current packs/day: 0.00    Average packs/day: 0.5 packs/day for 7.0 years (3.5 ttl pk-yrs)    Types: Cigarettes    Start date: 09/12/2011    Quit date: 09/11/2018    Years since quitting: 4.9   Smokeless tobacco: Never  Vaping Use   Vaping status: Never Used  Substance Use Topics   Alcohol  use: Not Currently   Drug use: Not Currently    Family Medical History: Family History  Problem Relation Age of Onset   Asthma Mother    Hypertension Mother        LIkely Severe/Malignant   Kidney failure Mother        ESRD - HD   CVA Mother    Alcohol  abuse Father    Depression Father    Asthma Sister    Hypertension Sister    Diabetes Mellitus II Sister    Asthma Brother    Hypertension Brother     Physical Examination: Vitals:   08/23/23 1614 08/23/23 1655  BP:  (!) 154/46  Pulse: (!) 46 (!) 55  Resp: 14 16  Temp: 97.7 F (36.5 C)  97.8 F (36.6 C)  SpO2:  100%    General: Patient is in no apparent distress.   NEUROLOGICAL:     Awake, alert, oriented to person, place, and year.  Speech is clear and fluent.  His speech output is somewhat sparse.  Cranial Nerves: Extraocular movements intact.  He has slight left facial droop.  He is able to move his right side well.  He has a left-sided hemiparesis.  He is able to squeeze my hand and move his right hand and leg to command but was not compliant with full motor examination.  Sensory examination is unreliable.  Gait is untested.  Medical Decision Making  Imaging: MRI Brain 08/23/2023  IMPRESSION: 1. Interval development of an acute right occipital convexity subdural hematoma, measuring up to 7 mm with small interhemispheric component. Mild mass effect on the underlying right occipital lobe. No midline shift. 2. Encephalomalacia from prior large right MCA territory and small right PCA territory infarcts. Unchanged cystic encephalomalacia in the left occipital lobe.     Electronically Signed   By: Ryan Chess M.D.   On: 08/23/2023 13:26    I have personally reviewed the images and agree with the above interpretation.  Assessment and Plan: Jeremy Browning is a pleasant 63 y.o. male with small acute right sided subdural hematoma.  He is now off of his Eliquis .  I have recommended repeating his scan.  He is already on seizure medications.  I will defer seizure management to my colleagues in neurology.  Given his underlying deficits and encephalomalacia, I would have a very high threshold to consider surgical intervention.    I have communicated my recommendations to the requesting physician and coordinated care to facilitate these recommendations.     Lavender Stanke K. Clois MD, [facility]surgery

## 2023-08-23 NOTE — Assessment & Plan Note (Addendum)
 Hemoglobin 8.9 and ferritin 236

## 2023-08-23 NOTE — Evaluation (Signed)
 Occupational Therapy Evaluation Patient Details Name: Jeremy Browning MRN: [id number] DOB: May 18, 1961 Today's Date: 08/23/2023   History of Present Illness Pt is a 63 y.o. 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   Clinical Impression   Pt was seen for OT evaluation with PT this date. Prior to hospital admission, pt was from a LTC receiving +2 assist for w/c transfers, heavy assist for ADL except for set up for meals, and all IADL. Pt lethargic upon presentation, requiring VC and sternal rubs with lights turned on to slightly improve alertness. Pt presents to acute OT demonstrating impaired ADL performance and functional mobility 2/2 impaired strength, activity tolerance, balance, and cognition (See OT problem list for additional functional deficits). Pt currently requires TOTAL A +2 for bed mobility, CGA to MAX A to correct for very poor sitting balance, and TOTAL A for bed level ADL at this time. Pt may benefit from skilled OT services to address noted impairments and functional limitations (see below for any additional details) in order to maximize safety and independence while minimizing falls risk and caregiver burden.     If plan is discharge home, recommend the following: Two people to help with walking and/or transfers;Two people to help with bathing/dressing/bathroom;Assistance with cooking/housework;Assist for transportation;Help with stairs or ramp for entrance;Assistance with feeding;Direct supervision/assist for medications management;Direct supervision/assist for financial management;Supervision due to cognitive status    Functional Status Assessment  Patient has had a recent decline in their functional status and demonstrates the ability to make significant improvements in function in a reasonable and predictable amount  of time.  Equipment Recommendations  Other (comment) (defer)    Recommendations for Other Services       Precautions / Restrictions Precautions Precautions: Fall Precaution Comments: seizures Restrictions Weight Bearing Restrictions Per Provider Order: No      Mobility Bed Mobility Overal bed mobility: Needs Assistance Bed Mobility: Supine to Sit, Sit to Supine     Supine to sit: Total assist, +2 for physical assistance, HOB elevated Sit to supine: Total assist, +2 for physical assistance        Transfers                          Balance Overall balance assessment: Needs assistance Sitting-balance support: Feet supported Sitting balance-Leahy Scale: Zero Sitting balance - Comments: several attempts to check pt righting reactions, able to do once but fatigued very quickly. also challenged by elevated gurney height                                   ADL either performed or assessed with clinical judgement   ADL Overall ADL's : Needs assistance/impaired                                       General ADL Comments: Pt currently requires TOTAL A +1-2 for all bed level ADL.     Vision         Perception         Praxis         Pertinent Vitals/Pain Pain Assessment Pain Assessment: Faces Faces Pain Scale: Hurts a little bit Pain  Location: generalized Pain Descriptors / Indicators: Grimacing, Guarding, Moaning Pain Intervention(s): Limited activity within patient's tolerance, Monitored during session, Repositioned     Extremity/Trunk Assessment Upper Extremity Assessment Upper Extremity Assessment: Generalized weakness;RUE deficits/detail RUE Deficits / Details: Increased tone, R>L, Pt keeps his RUE close to body, unable to achieve full extension in any joint RUE Coordination: decreased fine motor;decreased gross motor   Lower Extremity Assessment Lower Extremity Assessment: Defer to PT evaluation;Generalized  weakness       Communication     Cognition Arousal: Lethargic Behavior During Therapy: Flat affect Overall Cognitive Status: No family/caregiver present to determine baseline cognitive functioning                                 General Comments: pt spoke minimally to therapist, unable to provide PLOF. stated oh my god several times. did say to ask his brother where he lived     General Comments       Exercises     Shoulder Instructions      Home Living Family/patient expects to be discharged to:: Skilled nursing facility                                 Additional Comments: LTC. PT spoke with facility pt unable to provide PLOF.      Prior Functioning/Environment               Mobility Comments: per facility pt is a two person transfer to [facility], and on good days he is able to assist by weight bearing through his legs and use a cane. 1 assist for bed mobility, needs set up for meals assistance with dressing, incontinent in diaper and needs assist to changes ADLs Comments: Per facility, pt requires assist for all ADL and IADL, set up only for meals        OT Problem List: Decreased strength;Decreased range of motion;Decreased cognition;Decreased safety awareness;Decreased activity tolerance;Decreased knowledge of use of DME or AE;Impaired balance (sitting and/or standing);Impaired UE functional use      OT Treatment/Interventions: Self-care/ADL training;Therapeutic exercise;Therapeutic activities;Cognitive remediation/compensation;DME and/or AE instruction;Patient/family education;Balance training    OT Goals(Current goals can be found in the care plan section) Acute Rehab OT Goals Patient Stated Goal: pt unable to state OT Goal Formulation: With patient Time For Goal Achievement: 09/06/23 Potential to Achieve Goals: Fair ADL Goals Pt Will Perform Eating: sitting;with contact guard assist;with set-up Pt Will Perform Grooming: sitting;with  min assist Additional ADL Goal #1: Pt will maintain alertness for ADL tasks with PRN VC, 4/4 opportunities.  OT Frequency: Min 1X/week    Co-evaluation PT/OT/SLP Co-Evaluation/Treatment: Yes Reason for Co-Treatment: To address functional/ADL transfers;Necessary to address cognition/behavior during functional activity PT goals addressed during session: Mobility/safety with mobility;Balance OT goals addressed during session: ADL's and self-care      AM-PAC OT 6 Clicks Daily Activity     Outcome Measure Help from another person eating meals?: Total Help from another person taking care of personal grooming?: A Lot Help from another person toileting, which includes using toliet, bedpan, or urinal?: Total Help from another person bathing (including washing, rinsing, drying)?: Total Help from another person to put on and taking off regular upper body clothing?: Total Help from another person to put on and taking off regular lower body clothing?: Total 6 Click Score: 7   End of Session Nurse  Communication: Mobility status  Activity Tolerance: Patient limited by lethargy Patient left: in bed;with call bell/phone within reach;with bed alarm set  OT Visit Diagnosis: Other abnormalities of gait and mobility (R26.89);Muscle weakness (generalized) (M62.81)                Time: 9077-9065 OT Time Calculation (min): 12 min Charges:  OT General Charges $OT Visit: 1 Visit OT Evaluation $OT Eval Moderate Complexity: 1 Mod  Warren SAUNDERS., MPH, MS, OTR/L ascom [phone] 08/23/23, 11:34 AM

## 2023-08-23 NOTE — Assessment & Plan Note (Deleted)
 Creatinine 1.6 with a GFR 48

## 2023-08-23 NOTE — Assessment & Plan Note (Addendum)
 With Aerococcus growing out of culture switched Rocephin over to amoxicillin to complete course.  Also seen vancomycin-resistant Enterococcus in the culture.  Amoxicillin should cover both.  Could be contaminant with 2 organisms in culture.

## 2023-08-23 NOTE — ED Notes (Signed)
 EEG at bedside.

## 2023-08-24 ENCOUNTER — Inpatient Hospital Stay: Payer: Medicaid Other

## 2023-08-24 DIAGNOSIS — G40919 Epilepsy, unspecified, intractable, without status epilepticus: Secondary | ICD-10-CM | POA: Diagnosis not present

## 2023-08-24 DIAGNOSIS — M25522 Pain in left elbow: Secondary | ICD-10-CM | POA: Insufficient documentation

## 2023-08-24 DIAGNOSIS — S065XAA Traumatic subdural hemorrhage with loss of consciousness status unknown, initial encounter: Secondary | ICD-10-CM | POA: Diagnosis not present

## 2023-08-24 DIAGNOSIS — N3000 Acute cystitis without hematuria: Secondary | ICD-10-CM | POA: Diagnosis not present

## 2023-08-24 DIAGNOSIS — G9341 Metabolic encephalopathy: Secondary | ICD-10-CM | POA: Diagnosis not present

## 2023-08-24 LAB — GLUCOSE, CAPILLARY
Glucose-Capillary: 68 mg/dL — ABNORMAL LOW (ref 70–99)
Glucose-Capillary: 123 mg/dL — ABNORMAL HIGH (ref 70–99)

## 2023-08-24 MED ORDER — DIVALPROEX SODIUM 500 MG PO DR TAB
750.0000 mg | DELAYED_RELEASE_TABLET | Freq: Two times a day (BID) | ORAL | Status: DC
  Administered 2023-08-24 – 2023-08-28 (×8): 750 mg via ORAL
  Filled 2023-08-24 (×9): qty 1

## 2023-08-24 MED ORDER — DICLOFENAC SODIUM 1 % EX GEL
2.0000 g | Freq: Four times a day (QID) | CUTANEOUS | Status: DC
  Administered 2023-08-24 – 2023-08-28 (×16): 2 g via TOPICAL
  Filled 2023-08-24: qty 100

## 2023-08-24 MED ORDER — DEXTROSE 50 % IV SOLN
25.0000 g | Freq: Once | INTRAVENOUS | Status: AC
  Administered 2023-08-24: 25 g via INTRAVENOUS
  Filled 2023-08-24: qty 50

## 2023-08-24 MED ORDER — LEVETIRACETAM 500 MG PO TABS
1000.0000 mg | ORAL_TABLET | Freq: Two times a day (BID) | ORAL | Status: DC
  Administered 2023-08-24 – 2023-08-28 (×8): 1000 mg via ORAL
  Filled 2023-08-24 (×8): qty 2

## 2023-08-24 MED ORDER — AMOXICILLIN 500 MG PO CAPS
500.0000 mg | ORAL_CAPSULE | Freq: Three times a day (TID) | ORAL | Status: DC
  Administered 2023-08-25 – 2023-08-28 (×10): 500 mg via ORAL
  Filled 2023-08-24 (×13): qty 1

## 2023-08-24 NOTE — Assessment & Plan Note (Signed)
 No fracture on elbow x-ray.  Some swelling.  Continue to monitor.

## 2023-08-24 NOTE — Evaluation (Signed)
 Clinical/Bedside Swallow Evaluation Patient Details  Name: Jeremy Browning MRN: [id number] Date of Birth: 13-Sep-1960  Today's Date: 08/24/2023 Time: SLP Start Time (ACUTE ONLY): 1005 SLP Stop Time (ACUTE ONLY): 1028 SLP Time Calculation (min) (ACUTE ONLY): 23 min  Past Medical History:  Past Medical History:  Diagnosis Date   CHF (congestive heart failure) (HCC)    Chronic atrial fibrillation (HCC)    Diabetes mellitus type II, non insulin  dependent (HCC)    Essential hypertension Jeremy Browning   h/o Proteus pneumonia (HCC) 02/17/2021   Heart failure (HCC)    Unknown details.  By report no cardiac surgery   Seizures (HCC)    Severe hypoxic-ischemic encephalopathy    Sleep apnea    Stroke [facility])    Venous stasis dermatitis of left lower extremity    Past Surgical History:  Past Surgical History:  Procedure Laterality Date   HARDWARE REMOVAL Left 02/10/2022   Procedure: HARDWARE REMOVAL LEFT TIBIA;  Surgeon: Harden Jerona GAILS, MD;  Location: [facility] OR;  Service: Orthopedics;  Laterality: Left;   I & D EXTREMITY Left 02/10/2022   Procedure: DEBRIDEMENT OF LEFT TIBIA;  Surgeon: Harden Jerona GAILS, MD;  Location: [facility] OR;  Service: Orthopedics;  Laterality: Left;   LEFT HEART CATH AND CORONARY ANGIOGRAPHY N/A 12/25/2020   Procedure: LEFT HEART CATH AND CORONARY ANGIOGRAPHY;  Surgeon: Anner Alm ORN, MD;  Location: [facility] INVASIVE CV LAB;  Service: Cardiovascular;  Laterality: N/A; (In setting of Acute Hypoxic and Hypercapnic Respiratory Failure with Flash Pulm Edema/Hypertensive Emergency) - Angiographically minimal CAD.  Moderately reduced LV function with EF of 35 to 45%. LV P-EDP 101/21 mmHg - 30 mmHg   LEFT HEART CATH AND CORONARY ANGIOGRAPHY  09/12/2018   [facility]): Ostial LAD ~20% otherwise normal coronary arteries. LVEDP 20 mmHg;- HTN Emergency related Type II MI>   PEG PLACEMENT N/A 01/11/2021   Procedure: PERCUTANEOUS ENDOSCOPIC GASTROSTOMY (PEG) PLACEMENT;  Surgeon: Jinny Carmine, MD;  Location: [facility]  ENDOSCOPY;  Service: Endoscopy;  Laterality: N/A;   RIGHT HEART CATH N/A 12/25/2020   Procedure: RIGHT HEART CATH;  Surgeon: Anner Alm ORN, MD;  Location: [facility] INVASIVE CV LAB;  Service: Cardiovascular; R IJ: (Acute Hypoxic and Hypercapnic Respiratory Failure with Flash Pulm Edema/Hypertensive Emergency): CO-CI 6.54-2.82. Mild Secondary Pulmonary Hypertension (PCWP 30 mmHg.  PAP 47/13 mmHg with a mean PAP of 33 mmHg, RAP 22 mmHg in setting of AoP-MAP 97/59 mmHg - 71 mmHg,   TRACHEOSTOMY TUBE PLACEMENT N/A 01/12/2021   Procedure: TRACHEOSTOMY;  Surgeon: Milissa Hamming, MD;  Location: [facility] ORS;  Service: ENT;  Laterality: N/A;   TRANSTHORACIC ECHOCARDIOGRAM  09/11/2018   [facility]: Moderately reduced LVEF ~35%. 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 Mild-mod AI, Trivial MR?TR. LVEDD 6.0 (reduced compared to 2017: EF ~40-45%, LVEDD 5.6   TRANSTHORACIC ECHOCARDIOGRAM  12/27/2020   [facility]: (In Setting of Acute Hypoxic and Hypercapnic Respiratory Failure with Flash Pulm Edema/Hypertensive Emergency)  EF 45-50%. Mildly reduced LVEF with global HK. Moderately dilated LV with Gr 1 DD - mild LA dilation. Normal RV size Fxn.  Normal Vavles   TRANSTHORACIC ECHOCARDIOGRAM  03/07/2021   [facility]: EF improved to 60-65% with Nl LV Fxn & ~ normal diastolic fxn. Normal RV size & fxn. Mild MR & AI.  Normal LA size.   Unknown     HPI:  63yo male admitted 08/21/23 with seizures. PMH: HTN, HLD, DM, COPD, dCHF, AFib, CKD3a, R MCA CVA with L hemiparesis. Trach/PEG placement 5/22, now removed. MRI = [facility]  Assessment / Plan / Recommendation  Clinical Impression  Pt presents with increased risk for aspiration unless safe swallow precautions are followed. Pt voice quality is clear, volitional cough weak. He is unable to follow commands consistently for oral motor assessment. He is missing most of his teeth. Following oral care, pt accepted trials of ice chips, thin liquid, puree, and soft solid. Extended oral  prep noted with soft solids, likely due to poor dentition. No overt s/s aspiration observed on small individual sips of thin liquid, puree, or soft solid. Reflexive cough noted following large consecutive boluses of thin liquid via straw.   Pt has poor dentition, is unable to use his LUE, and he has tremors in his RUE, therefore, recommend Dys 2 diet with individual sips of thin liquid via straw (this diet recommended after CSE in 2023). SLP will follow to assess diet tolerance and continue education. Safe swallow precautions posted at [facility]. RN and MD informed of results and recommendations.  SLP Visit Diagnosis: Dysphagia, unspecified (R13.10)    Aspiration Risk  Moderate aspiration risk    Diet Recommendation Dysphagia 2 (Fine chop);Thin liquid    Liquid Administration via: Straw Medication Administration: Whole meds with liquid Supervision: Staff to assist with self feeding;Full supervision/cueing for compensatory strategies Compensations: Minimize environmental distractions;Slow rate;Small sips/bites Postural Changes: Seated upright at 90 degrees;Remain upright for at least 30 minutes after po intake    Other  Recommendations Oral Care Recommendations: Oral care BID    Recommendations for follow up therapy are one component of a multi-disciplinary discharge planning process, led by the attending physician.  Recommendations may be updated based on patient status, additional functional criteria and insurance authorization.  Follow up Recommendations Follow physician's recommendations for discharge plan and follow up therapies      Assistance Recommended at Discharge  24 hour supervision/assist  Functional Status Assessment  Pt with recent decline in functional status, limited ability to make significant improvement  Frequency and Duration min 1 x/week  1 week       Prognosis Prognosis for improved oropharyngeal function: Fair Barriers to Reach Goals: Cognitive deficits;Time post  onset      Swallow Study   General Date of Onset: 08/21/23 HPI: 63yo male admitted 08/21/23 with seizures. PMH: HTN, HLD, DM, COPD, dCHF, AFib, CKD3a, R MCA CVA with L hemiparesis. Trach/PEG placement 5/22, now removed. MRI = [facility] Type of Study: Bedside Swallow Evaluation Previous Swallow Assessment: CSE 03/12/22 - D2/thin Diet Prior to this Study: Dysphagia 1 (pureed);Mildly thick liquids (Level 2, nectar thick) Temperature Spikes Noted: No Respiratory Status: Room air History of Recent Intubation: No Behavior/Cognition: Alert;Cooperative;Doesn't follow directions;Requires cueing;Distractible Oral Cavity Assessment: Within Functional Limits Oral Care Completed by SLP: Yes Oral Cavity - Dentition: Missing dentition;Poor condition Vision: Functional for self-feeding Self-Feeding Abilities: Needs assist Patient Positioning: Upright in bed Baseline Vocal Quality: Normal Volitional Cough: Weak Volitional Swallow: Unable to elicit    Oral/Motor/Sensory Function Overall Oral Motor/Sensory Function: Mild impairment (unable to complete CN exam - pt unable to follow commands consistently)   Ice Chips Ice chips: Within functional limits Presentation: Spoon   Thin Liquid Thin Liquid: Impaired Presentation: Straw Pharyngeal  Phase Impairments: Suspected delayed Swallow;Cough - Immediate Other Comments: cough noted following large consecutive boluses. No cough following small individual sips via straw    Nectar Thick Nectar Thick Liquid: Not tested   Honey Thick Honey Thick Liquid: Not tested   Puree Puree: Within functional limits Presentation: Spoon   Solid  Solid: Impaired Presentation: Spoon Oral Phase Impairments: Impaired mastication Oral Phase Functional Implications: Impaired mastication Pharyngeal Phase Impairments: Suspected delayed Swallow     Joanathan Affeldt B. Dory, MSP, CCC-SLP Speech Language Pathologist  Dory Caprice Daring 08/24/2023,10:39 AM

## 2023-08-24 NOTE — Assessment & Plan Note (Addendum)
 Hold Eliquis and aspirin.  Will give lovenox dvt profylaxis until back on eliquis. Can restart Eliquis in on 09/02/23.

## 2023-08-24 NOTE — Progress Notes (Addendum)
 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  Patient had breakthrough seizure was postictal last night.  1/1.  This morning I was able to speak with the patient but he was hard to understand.  Was able to move his right side.  Did not do too well with the nursing swallow test.  Speech therapy consult ordered.  Case discussed with neurology and will give IV Keppra  1000 mg twice a day and IV Depakote  for now. 1/2.  Patient lethargic with speech therapist and did not feel comfortable placing on a diet.  When I saw him he was able to answer few questions but kept his eyes closed.  EEG (negative for seizure) and MRI of the brain (showed 7 mm right occipital subdural hematoma).  CT scan of the head shows unchanged subdural hematoma.  Appreciate neurosurgery consultation.  Holding blood thinners. 1/3.  Mental status much improved.  Switch seizure medications to oral.  Placed on diet.  Assessment and Plan: * Subdural hematoma (HCC) Hold Eliquis  and aspirin .  Can restart Eliquis  in 10 days.  Breakthrough seizure (HCC) Since mental status better switch Keppra  and Depakote  over to oral.  Acute cystitis without hematuria With Aerococcus growing out of culture will switch Rocephin  over to amoxicillin  for tomorrow.  Acute metabolic encephalopathy Much improved today.  Unclear if this was secondary to subdural hematoma, breakthrough seizure or UTI.  Much improved today.  CAD (coronary artery disease) Continue Lipitor   Chronic diastolic CHF (congestive heart failure) (HCC) No signs of heart failure.     Hyperlipidemia Lipitor    COPD (chronic obstructive pulmonary disease) (HCC) As needed albuterol  nebulizer  Essential hypertension Hold Cozaar  and Lasix   Type II diabetes mellitus with renal manifestations (HCC) Chronic disease stage IIIa.  Last hemoglobin A1c 5.5.    Atrial fibrillation, chronic (HCC) Hold Eliquis   Stage 3a chronic kidney disease (HCC) Creatinine 1.6 with a GFR 48  Left elbow pain No fracture on elbow x-ray.  Some swelling.  Continue to monitor.  Anemia of chronic disease Hemoglobin 9.5 and ferritin 236  History of stroke Left-sided paralysis.  Holding aspirin  and Eliquis .        Subjective: Patient complains of left elbow pain.  X-ray does not show fracture.  Patient's mental status much improved today.  Patient placed back on a diet and oral seizure medications.  Found to have a subdural hematoma and anticoagulation held.  Physical Exam: Vitals:   08/24/23 0425 08/24/23 0725 08/24/23 1158 08/24/23 1523  BP: 127/61 (!) 138/52 134/71 (!) 119/58  Pulse: 72 70 100 68  Resp: 18  19 19   Temp: 98.2 F (36.8 C) 98.4 F (36.9 C) 98.2 F (36.8 C) 99 F (37.2 C)  TempSrc: Oral  Oral   SpO2: 100% 100% 100% 100%  Weight:      Height:       Physical Exam HENT:     Head: Normocephalic.     Mouth/Throat:     Pharynx: No oropharyngeal exudate.  Eyes:     General: Lids are normal.  Cardiovascular:     Rate and Rhythm: Normal rate and regular rhythm.  Heart sounds: S1 normal and S2 normal. Murmur heard.     Systolic murmur is present with a grade of 2/6.  Pulmonary:     Breath sounds: No decreased breath sounds, wheezing, rhonchi or rales.  Abdominal:     Palpations: Abdomen is soft.     Tenderness: There is no abdominal tenderness.  Musculoskeletal:     Left elbow: Swelling present.     Right lower leg: No swelling.     Left lower leg: No swelling.  Skin:    General: Skin is warm.     Findings: No rash.  Neurological:     Mental  Status: He is lethargic.     Comments: Left-sided paralysis.  Able to squeeze my hand with his right hand..     Data Reviewed: MRI and repeat CT scan described above and hospital course  Family Communication: Left message for brother  Disposition: Status is: Inpatient Remains inpatient appropriate because: Just placed on diet today mental status better today than the last 2 days.  Will check with TOC on whether patient can return to his facility over the weekend  Planned Discharge Destination: Patient's facility    Time spent: 28 minutes  Author: Charlie Patterson, MD 08/24/2023 3:52 PM  For on call review [email or web address].

## 2023-08-24 NOTE — Plan of Care (Signed)

## 2023-08-25 DIAGNOSIS — G40919 Epilepsy, unspecified, intractable, without status epilepticus: Secondary | ICD-10-CM | POA: Diagnosis not present

## 2023-08-25 DIAGNOSIS — E162 Hypoglycemia, unspecified: Secondary | ICD-10-CM | POA: Diagnosis not present

## 2023-08-25 DIAGNOSIS — N189 Chronic kidney disease, unspecified: Secondary | ICD-10-CM

## 2023-08-25 DIAGNOSIS — N179 Acute kidney failure, unspecified: Secondary | ICD-10-CM

## 2023-08-25 DIAGNOSIS — N3000 Acute cystitis without hematuria: Secondary | ICD-10-CM | POA: Diagnosis not present

## 2023-08-25 DIAGNOSIS — S065XAA Traumatic subdural hemorrhage with loss of consciousness status unknown, initial encounter: Secondary | ICD-10-CM | POA: Diagnosis not present

## 2023-08-25 LAB — BASIC METABOLIC PANEL
Anion gap: 8 (ref 5–15)
BUN: 25 mg/dL — ABNORMAL HIGH (ref 8–23)
CO2: 28 mmol/L (ref 22–32)
Calcium: 8.7 mg/dL — ABNORMAL LOW (ref 8.9–10.3)
Chloride: 109 mmol/L (ref 98–111)
Creatinine, Ser: 1.26 mg/dL — ABNORMAL HIGH (ref 0.61–1.24)
GFR, Estimated: >60 mL/min (ref >60)
Glucose, Bld: 95 mg/dL (ref 70–99)
Potassium: 3.7 mmol/L (ref 3.5–5.1)
Sodium: 145 mmol/L (ref 135–145)

## 2023-08-25 LAB — GLUCOSE, CAPILLARY
Glucose-Capillary: 117 mg/dL — ABNORMAL HIGH (ref 70–99)
Glucose-Capillary: 44 mg/dL — CL (ref 70–99)
Glucose-Capillary: 63 mg/dL — ABNORMAL LOW (ref 70–99)
Glucose-Capillary: 67 mg/dL — ABNORMAL LOW (ref 70–99)
Glucose-Capillary: 74 mg/dL (ref 70–99)
Glucose-Capillary: 87 mg/dL (ref 70–99)
Glucose-Capillary: 98 mg/dL (ref 70–99)

## 2023-08-25 MED ORDER — FUROSEMIDE 10 MG/ML IJ SOLN
40.0000 mg | Freq: Once | INTRAMUSCULAR | Status: AC
  Administered 2023-08-25: 40 mg via INTRAVENOUS
  Filled 2023-08-25: qty 4

## 2023-08-25 MED ORDER — FUROSEMIDE 40 MG PO TABS
40.0000 mg | ORAL_TABLET | Freq: Every day | ORAL | Status: DC
  Administered 2023-08-26 – 2023-08-28 (×3): 40 mg via ORAL
  Filled 2023-08-25 (×3): qty 1

## 2023-08-25 NOTE — Evaluation (Signed)
 Speech Language Pathology Evaluation and Dysphagia Treatment Patient Details Name: Jeremy Browning MRN: [id number] DOB: 21-Aug-1961 Today's Date: 08/25/2023 Time: 1040-1100 SLP Time Calculation (min) (ACUTE ONLY): 20 min  Problem List:  Patient Active Problem List   Diagnosis Date Noted   Left elbow pain 08/24/2023   Acute cystitis without hematuria 08/23/2023   Acute metabolic encephalopathy 08/23/2023   Anemia of chronic disease 08/23/2023   Subdural hematoma (HCC) 08/23/2023   History of stroke 08/22/2023   Chronic diastolic CHF (congestive heart failure) (HCC) 08/21/2023   Type II diabetes mellitus with renal manifestations (HCC) 08/21/2023   CAD (coronary artery disease) 08/21/2023   Atrial fibrillation, chronic (HCC)    Sinus bradycardia 11/22/2022   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) 04/19/2022   Hyperkalemia 04/19/2022   Thrombocytopenia (HCC) 04/19/2022   Aspiration pneumonia (HCC) 03/13/2022   Seizures (HCC) 03/08/2022   Seizure (HCC) 03/08/2022   Status epilepticus (HCC)    Hypotension due to drugs    Subacute osteomyelitis of left tibia (HCC)    Hardware complicating wound infection (HCC)    Acute on chronic HFrEF (heart failure with reduced ejection fraction) (HCC)    Breakthrough seizure (HCC) 11/07/2021   NSTEMI (non-ST elevated myocardial infarction) (HCC) 11/07/2021   Stage 3a chronic kidney disease (HCC) 11/07/2021   Seizure disorder as sequela of cerebrovascular accident (HCC) 11/07/2021   Demand ischemia (HCC)    Status post open reduction and internal fixation (ORIF) of fracture 09/07/2021   Medication monitoring encounter 08/11/2021   Draining cutaneous sinus tract 08/11/2021   Chronic osteomyelitis (HCC) 08/11/2021   Morbidly obese (HCC) 05/14/2021   OSA (obstructive sleep apnea) 05/14/2021   Complete left bundle branch block (LBBB) 05/14/2021   Chronic venous insufficiency -Left Leg > Right. 05/14/2021    Right knee DJD 02/16/2021   Essential hypertension 02/16/2021   Pressure injury of skin 02/10/2021   Oropharyngeal dysphagia    HFrEF (heart failure with reduced ejection fraction) (HCC)    h/o Cardiopulmonary arrest with successful resuscitation (HCC) 12/25/2020   Acute respiratory failure with hypoxia (HCC) 12/25/2020   AKI (acute kidney injury) (HCC) 12/25/2020   Wenckebach second degree AV block 12/25/2020   Lactic acidosis 12/25/2020   Delayed surgical wound healing 06/23/2020   Skin ulcer of knee, left, limited to breakdown of skin (HCC) 06/23/2020   BMI 40.0-44.9, adult (HCC) 12/12/2019   Left ventricular dyssynchrony 09/14/2018   Hyperlipidemia 09/13/2018   Tobacco abuse 02/07/2017   Controlled type 2 diabetes mellitus without complication, without long-term current use of insulin  (HCC) 06/15/2016   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  By report no cardiac surgery   Seizures (HCC)    Severe hypoxic-ischemic encephalopathy    Sleep apnea    Stroke (HCC)    Venous stasis dermatitis of left lower extremity  Past Surgical History:  Past Surgical History:  Procedure Laterality Date   HARDWARE REMOVAL Left 02/10/2022   Procedure: HARDWARE REMOVAL LEFT TIBIA;  Surgeon: Harden Jerona GAILS, MD;  Location: [facility] OR;  Service: Orthopedics;   Laterality: Left;   I & D EXTREMITY Left 02/10/2022   Procedure: DEBRIDEMENT OF LEFT TIBIA;  Surgeon: Harden Jerona GAILS, MD;  Location: [facility] OR;  Service: Orthopedics;  Laterality: Left;   LEFT HEART CATH AND CORONARY ANGIOGRAPHY N/A 12/25/2020   Procedure: LEFT HEART CATH AND CORONARY ANGIOGRAPHY;  Surgeon: Anner Alm ORN, MD;  Location: [facility] INVASIVE CV LAB;  Service: Cardiovascular;  Laterality: N/A; (In setting of Acute Hypoxic and Hypercapnic Respiratory Failure with Flash Pulm Edema/Hypertensive Emergency) - Angiographically minimal CAD.  Moderately reduced LV function with EF of 35 to 45%. LV P-EDP 101/21 mmHg - 30 mmHg   LEFT HEART CATH AND CORONARY ANGIOGRAPHY  09/12/2018   [facility]): [place] LAD ~20% otherwise normal coronary arteries. LVEDP 20 mmHg;- HTN Emergency related Type II MI>   PEG PLACEMENT N/A 01/11/2021   Procedure: PERCUTANEOUS ENDOSCOPIC GASTROSTOMY (PEG) PLACEMENT;  Surgeon: Jinny Carmine, MD;  Location: [facility] ENDOSCOPY;  Service: Endoscopy;  Laterality: N/A;   RIGHT HEART CATH N/A 12/25/2020   Procedure: RIGHT HEART CATH;  Surgeon: Anner Alm ORN, MD;  Location: [facility] INVASIVE CV LAB;  Service: Cardiovascular; R IJ: (Acute Hypoxic and Hypercapnic Respiratory Failure with Flash Pulm Edema/Hypertensive Emergency): CO-CI 6.54-2.82. Mild Secondary Pulmonary Hypertension (PCWP 30 mmHg.  PAP 47/13 mmHg with a mean PAP of 33 mmHg, RAP 22 mmHg in setting of AoP-MAP 97/59 mmHg - 71 mmHg,   TRACHEOSTOMY TUBE PLACEMENT N/A 01/12/2021   Procedure: TRACHEOSTOMY;  Surgeon: Milissa Hamming, MD;  Location: [facility] ORS;  Service: ENT;  Laterality: N/A;   TRANSTHORACIC ECHOCARDIOGRAM  09/11/2018   [facility]: Moderately reduced LVEF ~35%. 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 Mild-mod AI, Trivial MR?TR. LVEDD 6.0 (reduced compared to 2017: EF ~40-45%, LVEDD 5.6   TRANSTHORACIC ECHOCARDIOGRAM  12/27/2020   [facility]: (In Setting of Acute Hypoxic and Hypercapnic Respiratory Failure with Flash  Pulm Edema/Hypertensive Emergency)  EF 45-50%. Mildly reduced LVEF with global HK. Moderately dilated LV with Gr 1 DD - mild LA dilation. Normal RV size Fxn.  Normal Vavles   TRANSTHORACIC ECHOCARDIOGRAM  03/07/2021   [facility]: EF improved to 60-65% with Nl LV Fxn & ~ normal diastolic fxn. Normal RV size & fxn. Mild MR & AI.  Normal LA size.   Unknown     HPI:  63yo male admitted 08/21/23 with seizures. PMH: HTN, HLD, DM, COPD, dCHF, AFib, CKD3a, R MCA CVA with L hemiparesis. Trach/PEG placement 5/22, now removed. MRI = [facility]   Assessment / Plan / Recommendation Clinical Impression  Pt seen for cognitive linguistic evaluation in the setting of subdural hematoma . Assessment consisting of pt interview and dynamic measures- pt with limited sustained attention for completion of standardized assessment. Overall assessment of pt's acute cognitive deficits is limited secondary to unsure baseline ability (hx of right MCA stroke with residual left hemiparesis and seizures) and co-morbidities (acute metabolic encephalopathy). Family not present for confirmation of baseline. Today, pt presents with deficits in sustained attention, orientation, and receptive language. Repetition and guided prompts aided processing and completion of one step commands. Internal distractions (pain) limited engagement in simple conversational prompts.  Motor speech notable for reduced articulation precision and low volume. Pt with emerging awareness for current deficits, endorsing that cognition is cloudy when prompted by SLP. Therefore,  recommend continued assessment to determine approximation to baseline and follow up recommendations. Pt in agreement with stated plan.   Pt also seen for dysphagia intervention targeting education and skilled observation. Pt seen with thin liquids and mixed consistencies (thin liquids and pill administration). No overt or subtle s/sx pharyngeal dysphagia noted. No change to vocal quality across trials. Pt  with noted impulsivity with thin liquids, taking consecutive sips. Verbal cues provided for single sips and increased awareness of pill in oral cavity. Question labial sensation vs inattention leading to reduced awareness of anterior loss of pill on lip. Verbal cues provided to ensure oral clearance. Recommend continued Dys 2 (Chopped solids) and Thin liquids with aspiration precautions (slow rate, small bites, elevated HOB, and alert for PO intake).      SLP Assessment  SLP Recommendation/Assessment: Patient needs continued Speech Lanaguage Pathology Services SLP Visit Diagnosis: Dysphagia, unspecified (R13.10);Cognitive communication deficit (R41.841)    Recommendations for follow up therapy are one component of a multi-disciplinary discharge planning process, led by the attending physician.  Recommendations may be updated based on patient status, additional functional criteria and insurance authorization.    Follow Up Recommendations  Follow physician's recommendations for discharge plan and follow up therapies    Assistance Recommended at Discharge  Frequent or constant Supervision/Assistance  Functional Status Assessment Patient has had a recent decline in their functional status and/or demonstrates limited ability to make significant improvements in function in a reasonable and predictable amount of time  Frequency and Duration min 1 x/week  1 week      SLP Evaluation Cognition  Overall Cognitive Status: No family/caregiver present to determine baseline cognitive functioning Arousal/Alertness: Awake/alert Orientation Level: Oriented to person;Oriented to place (not oriented to date or situation) Attention: Sustained Sustained Attention: Impaired Sustained Attention Impairment: Verbal basic Memory:  (needs further assessment) Awareness: Impaired Awareness Impairment: Intellectual impairment (when questioned regarding baseline cognition) Problem Solving:  (needs further  assessment) Executive Function:  (needs further assessment) Behaviors: Restless;Physical agitation;Perseveration Safety/Judgment: Impaired       Comprehension  Auditory Comprehension Overall Auditory Comprehension: Impaired Yes/No Questions: Not tested Commands: Impaired One Step Basic Commands: 50-74% accurate Conversation: Simple Interfering Components: Attention EffectiveTechniques: Repetition Visual Recognition/Discrimination Discrimination: Not tested Reading Comprehension Reading Status: Not tested    Expression Expression Primary Mode of Expression: Verbal Verbal Expression Overall Verbal Expression: Appears within functional limits for tasks assessed Written Expression Written Expression: Not tested   Oral / Motor  Oral Motor/Sensory Function Overall Oral Motor/Sensory Function: Mild impairment Facial Sensation: Reduced left;Other (Comment) (potential reduced labial sensation (pill sitting on lip without attempt to clear pill into oral cavity without verbal cues)) Motor Speech Overall Motor Speech: Other (comment) (reduced intelligibility- question baseline) Respiration: Within functional limits Resonance: Within functional limits Articulation: Impaired Level of Impairment: Sentence Intelligibility: Intelligibility reduced Sentence: 75-100% accurate Interfering Components: Inadequate dentition;Premorbid status Effective Techniques: Increased vocal intensity            [place] Certain J Clapp 08/25/2023, 12:09 PM

## 2023-08-25 NOTE — TOC Progression Note (Addendum)
 Transition of Care [facility]) - Progression Note    Patient Details  Name: Jeremy Browning MRN: [id number] Date of Birth: 1960/12/28  Transition [facility] CM/SW Contact  Alvia Olam Fabry, RN Phone Number: 08/25/2023, 9:15 AM  Clinical Narrative:     [facility] RN reached out to [facility] for potential admission of this pt however only able to leave a voice message to [place]. Currently awaiting a call back for an update.   TOC will continue to follow up accordingly regarding pt's discharge disposition.  9:50 am-Addendum: Received a call back from Tanya from [facility] concerning bed availability for admission. Currently awaiting a call back.   Expected Discharge Plan: Skilled Nursing Facility Barriers to Discharge: Continued Medical Work up  Expected Discharge Plan and Services       Living arrangements for the past 2 months: Skilled Nursing Facility                                       Social Determinants of Health (SDOH) Interventions SDOH Screenings   Food Insecurity: No Food Insecurity (08/23/2023)  Housing: Low Risk  (08/23/2023)  Transportation Needs: No Transportation Needs (08/23/2023)  Utilities: Not At Risk (08/23/2023)  Depression (PHQ2-9): Low Risk  (09/16/2021)  Financial Resource Strain: Low Risk  (12/26/2019)   Received from [facility], [facility]  Physical Activity: Insufficiently Active (12/26/2019)   Received from [facility], [facility]  Social Connections: Moderately Isolated (12/26/2019)   Received from [facility], [facility]  Stress: Stress Concern Present (12/26/2019)   Received from [facility], [facility]  Tobacco Use: Medium Risk (08/21/2023)    Readmission Risk Interventions    08/23/2023   12:19 PM  Readmission Risk Prevention Plan  Transportation Screening Complete  PCP or Specialist Appt within 5-7 Days Complete   Medication Review (RN CM) Complete

## 2023-08-25 NOTE — Plan of Care (Signed)

## 2023-08-25 NOTE — Assessment & Plan Note (Addendum)
 AKI on CKD stage IIIa.  With IV fluids creatinine improved from a peak of 1.65 down to 1.10.

## 2023-08-25 NOTE — Progress Notes (Signed)
 Progress Note   Patient: Jeremy Browning FMW:[id number] DOB: November 07, 1960 DOA: 08/21/2023     3 DOS: the patient was seen and examined on 08/25/2023   Brief hospital course: 63 year old man past medical history of hypertension, hyperlipidemia, type 2 diabetes mellitus, COPD, CAD, diastolic congestive heart failure, atrial fibrillation on Eliquis , CKD stage IIIa, 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  Patient had breakthrough seizure was postictal last night.  1/1.  This morning I was able to speak with the patient but he was hard to understand.  Was able to move his right side.  Did not do too well with the nursing swallow test.  Speech therapy consult ordered.  Case discussed with neurology and will give IV Keppra  1000 mg twice a day and IV Depakote  for now. 1/2.  Patient lethargic with speech therapist and did not feel comfortable placing on a diet.  When I saw him he was able to answer few questions but kept his eyes closed.  EEG (negative for seizure) and MRI of the brain (showed 7 mm right occipital subdural hematoma).  CT scan of the head shows unchanged subdural hematoma.  Appreciate neurosurgery consultation.  Holding blood thinners. 1/3.  Mental status much improved.  Switch seizure medications to oral.  Placed on diet. 1/4.  Fingerstick 44 this morning.  Patient given juice.  Creatinine improved to 1.26.  Assessment and Plan: * Subdural hematoma (HCC) Hold Eliquis  and aspirin .  Can restart Eliquis  in 9 days.  Breakthrough seizure (HCC) Since mental status better switched back to oral Keppra  and Depakote  for evening on 1/3.SABRA  Acute cystitis without hematuria With Aerococcus growing out of culture switched Rocephin  over to amoxicillin  for today.  Hypoglycemia Not sure if fingerstick is correlating to chemistry.  Will check fingersticks today and treat for low sugars but will check a chemistry tomorrow morning at 8 AM at the time that they  do fingersticks.  Acute metabolic encephalopathy Improved from admission.  Unclear if this was secondary to subdural hematoma, breakthrough seizure or UTI.  CAD (coronary artery disease) Continue Lipitor   Chronic diastolic CHF (congestive heart failure) (HCC) No signs of heart failure.  Will restart Lasix  since now back on a diet.  Hyperlipidemia Lipitor    COPD (chronic obstructive pulmonary disease) (HCC) As needed albuterol  nebulizer  Essential hypertension Will restart Lasix .  Type II diabetes mellitus with renal manifestations (HCC) Chronic disease stage IIIa.  Last hemoglobin A1c 5.5.    Atrial fibrillation, chronic (HCC) Holding Eliquis   Left elbow pain No fracture on elbow x-ray.  Some swelling.  Continue to monitor.  Anemia of chronic disease Hemoglobin 9.5 and ferritin 236  History of stroke Left-sided paralysis.  Holding aspirin  and Eliquis  for right now.  Acute kidney injury superimposed on CKD (HCC) AKI on CKD stage IIIa.  With IV fluids creatinine improved from a peak of 1.65 down to 1.26.        Subjective: Patient feeling okay.  Still having some elbow pain.  Had a low sugar this morning but not sure if it is correlating correctly with the chemistry.  Patient admitted with seizure and then found to have subdural hematoma.  Physical Exam: Vitals:   08/24/23 1523 08/24/23 2044 08/25/23 0004 08/25/23 0430  BP: (!) 119/58 (!) 140/50 (!) 132/44 (!) 127/51  Pulse: 68 74 63 (!) 55  Resp: 19 19 18 18   Temp: 99 F (37.2 C) 98.1 F (36.7 C) 97.6 F (36.4 C) 97.7 F (36.5  C)  TempSrc:   Oral Axillary  SpO2: 100% 100% 96% 97%  Weight:    83.7 kg  Height:       Physical Exam HENT:     Head: Normocephalic.     Mouth/Throat:     Pharynx: No oropharyngeal exudate.  Eyes:     General: Lids are normal.  Cardiovascular:     Rate and Rhythm: Normal rate and regular rhythm.     Heart sounds: S1 normal and S2 normal. Murmur heard.     Systolic murmur is  present with a grade of 2/6.  Pulmonary:     Breath sounds: No decreased breath sounds, wheezing, rhonchi or rales.  Abdominal:     Palpations: Abdomen is soft.     Tenderness: There is no abdominal tenderness.  Musculoskeletal:     Left elbow: Swelling present.     Right lower leg: No swelling.     Left lower leg: No swelling.     Comments: Left arm contracted  Skin:    General: Skin is warm.     Findings: No rash.  Neurological:     Mental Status: He is alert.     Comments: Left-sided paralysis.  Able to squeeze my hand with his right hand..     Data Reviewed: Fingerstick this morning 44, then 63 then 67 then 87.  Chemistry glucose 95 at 9:23 AM. Creatinine 1.26  Family Communication: Spoke with family on the phone  Disposition: Status is: Inpatient Remains inpatient appropriate because: Unlikely to send back to group home over the weekend.  Hopefully on Monday.  Continue to monitor sugars.  Not sure if the fingersticks are correlating to the chemistry.  Planned Discharge Destination: Group home    Time spent: 28 minutes  Author: Charlie Patterson, MD 08/25/2023 3:04 PM  For on call review [email or web address].

## 2023-08-25 NOTE — Assessment & Plan Note (Addendum)
 Patient Must eat an evening snack and early breakfast.  Patient is asymptomatic with low sugar.  Continue to encourage eating and drinking.  Insulin  and c-peptide normal.  Can consider a dexcom monitor if patient having low sugars to check sugar at a different location from finger.  Not sure if FS is that accurate on him.  Check fingerstick if having altered mental status.

## 2023-08-25 NOTE — Plan of Care (Signed)

## 2023-08-26 DIAGNOSIS — N3000 Acute cystitis without hematuria: Secondary | ICD-10-CM | POA: Diagnosis not present

## 2023-08-26 DIAGNOSIS — E162 Hypoglycemia, unspecified: Secondary | ICD-10-CM | POA: Diagnosis not present

## 2023-08-26 DIAGNOSIS — G40919 Epilepsy, unspecified, intractable, without status epilepticus: Secondary | ICD-10-CM | POA: Diagnosis not present

## 2023-08-26 DIAGNOSIS — S065XAA Traumatic subdural hemorrhage with loss of consciousness status unknown, initial encounter: Secondary | ICD-10-CM | POA: Diagnosis not present

## 2023-08-26 LAB — GLUCOSE, CAPILLARY
Glucose-Capillary: 48 mg/dL — ABNORMAL LOW (ref 70–99)
Glucose-Capillary: 80 mg/dL (ref 70–99)
Glucose-Capillary: 35 mg/dL — CL (ref 70–99)
Glucose-Capillary: 85 mg/dL (ref 70–99)
Glucose-Capillary: 111 mg/dL — ABNORMAL HIGH (ref 70–99)
Glucose-Capillary: 85 mg/dL (ref 70–99)

## 2023-08-26 LAB — BASIC METABOLIC PANEL
Anion gap: 7 (ref 5–15)
BUN: 22 mg/dL (ref 8–23)
CO2: 28 mmol/L (ref 22–32)
Calcium: 8.3 mg/dL — ABNORMAL LOW (ref 8.9–10.3)
Chloride: 105 mmol/L (ref 98–111)
Creatinine, Ser: 1.16 mg/dL (ref 0.61–1.24)
GFR, Estimated: >60 mL/min (ref >60)
Glucose, Bld: 80 mg/dL (ref 70–99)
Potassium: 4.1 mmol/L (ref 3.5–5.1)
Sodium: 140 mmol/L (ref 135–145)

## 2023-08-26 MED ORDER — DEXTROSE 50 % IV SOLN
INTRAVENOUS | Status: AC
  Administered 2023-08-27: 50 mL
  Filled 2023-08-26: qty 50

## 2023-08-26 NOTE — Progress Notes (Signed)
 Blood sugar rechecked at 0553, glucose level 71.

## 2023-08-26 NOTE — Progress Notes (Signed)
 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  Patient had breakthrough seizure was postictal last night.  1/1.  This morning I was able to speak with the patient but he was hard to understand.  Was able to move his right side.  Did not do too well with the nursing swallow test.  Speech therapy consult ordered.  Case discussed with neurology and will give IV Keppra  1000 mg twice a day and IV Depakote  for now. 1/2.  Patient lethargic with speech therapist and did not feel comfortable placing on a diet.  When I saw him he was able to answer few questions but kept his eyes closed.  EEG (negative for seizure) and MRI of the brain (showed 7 mm right occipital subdural hematoma).  CT scan of the head shows unchanged subdural hematoma.  Appreciate neurosurgery consultation.  Holding blood thinners. 1/3.  Mental status much improved.  Switch seizure medications to oral.  Placed on diet. 1/4.  Fingerstick 44 this morning.  Patient given juice.  Creatinine improved to 1.26.  Assessment and Plan: * Hypoglycemia Fingerstick seems to be correlating with the chemistry.  Patient had 2 low sugars today 1 before I came on call and the second 1 nursing staff replaced glucose before I was able to order stat labs.  Advised nursing staff to call me on the next low sugar so I can order labs before we replace since he has asymptomatic during this episode.  Would like to send off sulfonylurea screen, insulin  and C-peptide and glucose while having the episode of hypoglycemia.  Subdural hematoma (HCC) Hold Eliquis  and aspirin .   Can restart Eliquis  in 8 days.  Breakthrough seizure (HCC) Mental status much improved since coming in.  Initially required IV medications and now on oral.  Acute cystitis without hematuria With Aerococcus growing out of culture switched Rocephin  over to amoxicillin  to complete course.  Also seen Enterococcus in the culture.  Amoxicillin  should cover.  Acute metabolic encephalopathy Improved from admission.  Unclear if this was secondary to subdural hematoma, breakthrough seizure or UTI.  CAD (coronary artery disease) Continue Lipitor   Chronic diastolic CHF (congestive heart failure) (HCC) No signs of heart failure.  Continue Lasix .  Hyperlipidemia Lipitor    COPD (chronic obstructive pulmonary disease) (HCC) As needed albuterol  nebulizer  Essential hypertension Continue Lasix .  Type II diabetes mellitus with renal manifestations (HCC) Chronic disease stage IIIa.  Last hemoglobin A1c 5.5.    Atrial fibrillation, chronic (HCC) Holding Eliquis   Left elbow pain No fracture on elbow x-ray.  Some swelling.  Continue to monitor.  Anemia of chronic disease Hemoglobin 9.5 and ferritin 236  History of stroke Left-sided paralysis.  Holding aspirin  and Eliquis  for right now.  Acute kidney injury superimposed on CKD (HCC) AKI on CKD stage IIIa.  With IV fluids creatinine improved from a peak of 1.65 down to 1.16.        Subjective: Patient seen this morning.  Felt okay.  Asking when he can go back to his facility.  Patient had an episode of hypoglycemia at 5:13 AM and was replaced.  Another episode at 11:45 AM.  Nursing staff replaced  his sugar before I had an opportunity to draw stat labs.  Awaiting MAC sugar.  Patient admitted with seizure and also found to have subdural hematoma.  Physical Exam: Vitals:   08/25/23 2101 08/26/23 0356 08/26/23 0436 08/26/23 0747  BP: (!) 146/68  (!) 133/45 (!) 116/37  Pulse: (!) 56  (!) 50   Resp: 17  18 16   Temp: 98.3 F (36.8 C)  98.1  F (36.7 C) 97.7 F (36.5 C)  TempSrc:   Oral Oral  SpO2: 100%  100% 100%  Weight:  84.7 kg    Height:       Physical Exam HENT:     Head: Normocephalic.     Mouth/Throat:     Pharynx: No oropharyngeal exudate.  Eyes:     General: Lids are normal.  Cardiovascular:     Rate and Rhythm: Normal rate and regular rhythm.     Heart sounds: S1 normal and S2 normal. Murmur heard.     Systolic murmur is present with a grade of 2/6.  Pulmonary:     Breath sounds: No decreased breath sounds, wheezing, rhonchi or rales.  Abdominal:     Palpations: Abdomen is soft.     Tenderness: There is no abdominal tenderness.  Musculoskeletal:     Left elbow: Swelling present.     Right lower leg: No swelling.     Left lower leg: No swelling.     Comments: Left arm contracted  Skin:    General: Skin is warm.     Findings: No rash.  Neurological:     Mental Status: He is alert.     Comments: Left-sided paralysis.  Able to squeeze my hand with his right hand..     Data Reviewed: Glucose 48 on 5:13 AM, glucose 35 at 11:45 AM. Creatinine 1.16  Family Communication: Updated brother on the phone  Disposition: Status is: Inpatient Remains inpatient appropriate because: Would like to send off insulin , C-peptide, glucose and sulfonylurea screen with next low sugar.  Advised nursing staff not to replace glucose before these labs are drawn.  Planned Discharge Destination: Group home    Time spent: 28 minutes  Author: Charlie Patterson, MD 08/26/2023 4:20 PM  For on call review [email or web address].

## 2023-08-26 NOTE — Plan of Care (Signed)

## 2023-08-27 DIAGNOSIS — G40919 Epilepsy, unspecified, intractable, without status epilepticus: Secondary | ICD-10-CM | POA: Diagnosis not present

## 2023-08-27 DIAGNOSIS — S065XAA Traumatic subdural hemorrhage with loss of consciousness status unknown, initial encounter: Secondary | ICD-10-CM | POA: Diagnosis not present

## 2023-08-27 DIAGNOSIS — E162 Hypoglycemia, unspecified: Secondary | ICD-10-CM | POA: Diagnosis not present

## 2023-08-27 DIAGNOSIS — N3000 Acute cystitis without hematuria: Secondary | ICD-10-CM | POA: Diagnosis not present

## 2023-08-27 LAB — CBC
HCT: 28.1 % — ABNORMAL LOW (ref 39.0–52.0)
Hemoglobin: 8.9 g/dL — ABNORMAL LOW (ref 13.0–17.0)
MCH: 28.3 pg (ref 26.0–34.0)
MCHC: 31.7 g/dL (ref 30.0–36.0)
MCV: 89.5 fL (ref 80.0–100.0)
Platelets: 156 10*3/uL (ref 150–400)
RBC: 3.14 MIL/uL — ABNORMAL LOW (ref 4.22–5.81)
RDW: 14.3 % (ref 11.5–15.5)
WBC: 6.4 10*3/uL (ref 4.0–10.5)
nRBC: 0 % (ref 0.0–0.2)

## 2023-08-27 LAB — BASIC METABOLIC PANEL
Anion gap: 9 (ref 5–15)
BUN: 22 mg/dL (ref 8–23)
CO2: 29 mmol/L (ref 22–32)
Calcium: 8.2 mg/dL — ABNORMAL LOW (ref 8.9–10.3)
Chloride: 102 mmol/L (ref 98–111)
Creatinine, Ser: 1.1 mg/dL (ref 0.61–1.24)
GFR, Estimated: >60 mL/min (ref >60)
Glucose, Bld: 78 mg/dL (ref 70–99)
Potassium: 4.4 mmol/L (ref 3.5–5.1)
Sodium: 140 mmol/L (ref 135–145)

## 2023-08-27 LAB — GLUCOSE, CAPILLARY
Glucose-Capillary: 109 mg/dL — ABNORMAL HIGH (ref 70–99)
Glucose-Capillary: 138 mg/dL — ABNORMAL HIGH (ref 70–99)
Glucose-Capillary: 46 mg/dL — ABNORMAL LOW (ref 70–99)
Glucose-Capillary: 71 mg/dL (ref 70–99)
Glucose-Capillary: 71 mg/dL (ref 70–99)
Glucose-Capillary: 72 mg/dL (ref 70–99)
Glucose-Capillary: 82 mg/dL (ref 70–99)
Glucose-Capillary: 93 mg/dL (ref 70–99)
Glucose-Capillary: 93 mg/dL (ref 70–99)
Glucose-Capillary: 96 mg/dL (ref 70–99)

## 2023-08-27 LAB — URINE CULTURE: Culture: >=100000 — AB

## 2023-08-27 MED ORDER — ENOXAPARIN SODIUM 40 MG/0.4ML IJ SOSY
40.0000 mg | PREFILLED_SYRINGE | INTRAMUSCULAR | Status: DC
  Administered 2023-08-27: 40 mg via SUBCUTANEOUS
  Filled 2023-08-27: qty 0.4

## 2023-08-27 NOTE — Plan of Care (Signed)

## 2023-08-27 NOTE — Progress Notes (Signed)
 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  Patient had breakthrough seizure was postictal last night.  1/1.  This morning I was able to speak with the patient but he was hard to understand.  Was able to move his right side.  Did not do too well with the nursing swallow test.  Speech therapy consult ordered.  Case discussed with neurology and will give IV Keppra  1000 mg twice a day and IV Depakote  for now. 1/2.  Patient lethargic with speech therapist and did not feel comfortable placing on a diet.  When I saw him he was able to answer few questions but kept his eyes closed.  EEG (negative for seizure) and MRI of the brain (showed 7 mm right occipital subdural hematoma).  CT scan of the head shows unchanged subdural hematoma.  Appreciate neurosurgery consultation.  Holding blood thinners. 1/3.  Mental status much improved.  Switch seizure medications to oral.  Placed on diet. 1/4.  Fingerstick 44 this morning.  Patient given juice.  Creatinine improved to 1.26. 1/5   Fingerstick a.m. 48, repeat fingerstick at 11:45 was 35 and glucose was replaced before I was notified to check my hypoglycemic labs. 1/6.  Fingerstick 46 this morning.  When they drew the hypoglycemic labs glucose on the chemistry was 78.  Not sure if this is reading correctly.  Insulin  and C-peptide and Sophia urea screen pending.  Advised he must eat an evening snack.   Assessment and Plan: * Hypoglycemia Patient had another low sugar today of 46.  When I did my hypoglycemia labs sugar on the chemistry was 78.  Advised to eat a evening  snack.  Nursing staff states that his fingers are cold and we may not get a accurate reading.  Subdural hematoma (HCC) Hold Eliquis  and aspirin .  Can restart Eliquis  in 7 days.  Breakthrough seizure (HCC) Mental status much improved since coming in.  Initially required IV medications and now on oral.  Acute cystitis without hematuria With Aerococcus growing out of culture switched Rocephin  over to amoxicillin  to complete course.  Also seen vancomycin -resistant Enterococcus in the culture.  Amoxicillin  should cover both.  Acute metabolic encephalopathy Improved from admission.  Unclear if this was secondary to subdural hematoma, breakthrough seizure or UTI.  CAD (coronary artery disease) Continue Lipitor   Chronic diastolic CHF (congestive heart failure) (HCC) No signs of heart failure.  Continue Lasix .  Hyperlipidemia Lipitor    COPD (chronic obstructive pulmonary disease) (HCC) As needed albuterol  nebulizer  Essential hypertension Continue Lasix .  Type II diabetes mellitus with renal manifestations (HCC) Chronic disease stage IIIa.  Last hemoglobin A1c 5.5.    Atrial fibrillation, chronic (HCC) Holding Eliquis   Left elbow pain No fracture on elbow x-ray.  Some swelling.  Continue to monitor.  Anemia of chronic disease Hemoglobin 8.9 and ferritin 236  History of stroke Left-sided paralysis.  Holding aspirin  and Eliquis  for right now.  Acute kidney injury superimposed on CKD (HCC) AKI on CKD stage IIIa.  With IV fluids creatinine improved from a peak of 1.65 down to 1.10.        Subjective: Patient had another low sugar this  morning at 5 AM without replacement of glucose.  By the time they drew labs at 5:28 AM sugar was 78.  Insulin  and C-peptide and sulfonylurea screen pending.  Not sure if fingersticks are correlating correctly.  Patient advised to eat more and eat an evening snack.  Physical Exam: Vitals:   08/26/23 1718 08/26/23 2216 08/27/23 0800 08/27/23  1634  BP: (!) 123/40 (!) 141/53 (!) 153/41 (!) 147/56  Pulse: (!) 53 61 60 83  Resp: 16 18 16 16   Temp: 98.5 F (36.9 C) 98.6 F (37 C) (!) 97 F (36.1 C) 99.1 F (37.3 C)  TempSrc: Oral Oral  Oral  SpO2: 97% 100% 90% 100%  Weight:      Height:       Physical Exam HENT:     Head: Normocephalic.     Mouth/Throat:     Pharynx: No oropharyngeal exudate.  Eyes:     General: Lids are normal.  Cardiovascular:     Rate and Rhythm: Normal rate and regular rhythm.     Heart sounds: S1 normal and S2 normal. Murmur heard.     Systolic murmur is present with a grade of 2/6.  Pulmonary:     Breath sounds: No decreased breath sounds, wheezing, rhonchi or rales.  Abdominal:     Palpations: Abdomen is soft.     Tenderness: There is no abdominal tenderness.  Musculoskeletal:     Left elbow: Swelling present.     Right lower leg: No swelling.     Left lower leg: No swelling.     Comments: Left arm contracted  Skin:    General: Skin is warm.     Findings: No rash.  Neurological:     Mental Status: He is alert.     Comments: Left-sided paralysis.  Able to squeeze my hand with his right hand..     Data Reviewed: Urine culture growing Aerococcus and vancomycin -resistant Enterococcus. Hemoglobin 8.9, white blood count 6.4, creatinine 1.1, glucose on chemistry 78  Family Communication: Spoke with brother on the phone  Disposition: Status is: Inpatient Remains inpatient appropriate because: Trying to figure out if hypoglycemia is real or not.  Patient is not on any diabetic medications at this facility.  Fingerstick this morning 46 but by the time the chemistry was drawn was 78.  Planned Discharge Destination: Long-term care    Time spent: 35 minutes  Author: Charlie Patterson, MD 08/27/2023 5:40 PM  For on call review [email or web address].

## 2023-08-27 NOTE — Progress Notes (Addendum)
 Speech Language Pathology Treatment: Dysphagia;Cognitive-Linquistic  Patient Details Name: Jeremy Browning MRN: [id number] DOB: 1960/11/30 Today's Date: 08/27/2023 Time: 1130-1150 SLP Time Calculation (min) (ACUTE ONLY): 20 min  Assessment / Plan / Recommendation Clinical Impression  Pt seen for follow up dysphagia intervention and further dynamic cognitive assessment. Regarding dysphagia, pt seen with trials of advanced solids secondary to pt reporting dissatisfaction with current chopped solids. Oral phase notable for prolonged mastication and oral clearance with increased risk for aspiration/choking with advanced solids based on lethargy. Liquid wash utilized to facilitate oral clearance. Pt noted to have reflexive cough following consecutive straw sips, no other s/sx of aspiration. Based on lethargy, dentition, reduced attention to oral cavity and impulsivity, recommend continued Dys 2 and thin liquids with aspiration precautions (slow rate, small bites, elevated HOB, and alert for PO intake).    Regarding cognition, impairments noted with sustained attention and delayed processing, though suspect that lethargic state likely impacting presentation. Short term memory limited for information shared by MD, with pt benefiting from verbal cues for clarification and repetition. Variance from baseline is still unknown, suspect that follow up cognitive assessment with return to facility will aid differentiation from baseline in familiar setting and with familiar staff.   SLP will continue to follow per POC.    HPI HPI: 63yo male admitted 08/21/23 with seizures. PMH: HTN, HLD, DM, COPD, dCHF, AFib, CKD3a, R MCA CVA with L hemiparesis. Trach/PEG placement 5/22, now removed. MRI = [facility]      SLP Plan  Continue with current plan of care      Recommendations for follow up therapy are one component of a multi-disciplinary discharge planning process, led by the attending physician.  Recommendations may be  updated based on patient status, additional functional criteria and insurance authorization.    Recommendations  Diet recommendations: Dysphagia 2 (fine chop);Thin liquid Liquids provided via: Straw (single sips) Medication Administration: Whole meds with liquid Supervision: Staff to assist with self feeding;Full supervision/cueing for compensatory strategies Compensations: Minimize environmental distractions;Slow rate;Small sips/bites Postural Changes and/or Swallow Maneuvers: Seated upright 90 degrees;Upright 30-60 min after meal                  Oral care BID   Frequent or constant Supervision/Assistance Dysphagia, unspecified (R13.10);Cognitive communication deficit (R41.841)     Continue with current plan of care   Jayron Maqueda Clapp  MS [facility] SLP   Cecile Guevara J Clapp  08/27/2023, 12:42 PM

## 2023-08-27 NOTE — Progress Notes (Signed)
 PT Cancellation Note  Patient Details Name: Jeremy Browning MRN: [id number] DOB: November 17, 1960   Cancelled Treatment:    Reason Eval/Treat Not Completed: Other (comment) (attempted to see patient, meal just arriving to room. Will attemtp again at later date/time as able.)   Moncia Annas C 08/27/2023, 3:23 PM

## 2023-08-28 DIAGNOSIS — S065XAA Traumatic subdural hemorrhage with loss of consciousness status unknown, initial encounter: Secondary | ICD-10-CM | POA: Diagnosis not present

## 2023-08-28 DIAGNOSIS — G40919 Epilepsy, unspecified, intractable, without status epilepticus: Secondary | ICD-10-CM | POA: Diagnosis not present

## 2023-08-28 DIAGNOSIS — N3000 Acute cystitis without hematuria: Secondary | ICD-10-CM | POA: Diagnosis not present

## 2023-08-28 DIAGNOSIS — E162 Hypoglycemia, unspecified: Secondary | ICD-10-CM | POA: Diagnosis not present

## 2023-08-28 LAB — INSULIN AND C-PEPTIDE, SERUM
C-Peptide: 3 ng/mL (ref 1.1–4.4)
Insulin: 6 u[IU]/mL (ref 2.6–24.9)

## 2023-08-28 LAB — GLUCOSE, CAPILLARY
Glucose-Capillary: 79 mg/dL (ref 70–99)
Glucose-Capillary: 52 mg/dL — ABNORMAL LOW (ref 70–99)
Glucose-Capillary: 78 mg/dL (ref 70–99)
Glucose-Capillary: 84 mg/dL (ref 70–99)

## 2023-08-28 LAB — HEMOGLOBIN A1C
Hgb A1c MFr Bld: 4.3 % — ABNORMAL LOW (ref 4.8–5.6)
Mean Plasma Glucose: 77 mg/dL

## 2023-08-28 MED ORDER — DICLOFENAC SODIUM 1 % EX GEL: CUTANEOUS | 0 refills | Status: DC

## 2023-08-28 MED ORDER — DIAZEPAM 5 MG/ML IJ SOLN: 5.0000 mg | Freq: Every day | INTRAMUSCULAR | 0 refills | Status: DC | PRN

## 2023-08-28 MED ORDER — LEVETIRACETAM 1000 MG PO TABS: 1000.0000 mg | ORAL_TABLET | Freq: Two times a day (BID) | ORAL | 0 refills | Status: DC

## 2023-08-28 MED ORDER — AMOXICILLIN 500 MG PO CAPS: 500.0000 mg | ORAL_CAPSULE | Freq: Three times a day (TID) | ORAL | 0 refills | Status: AC

## 2023-08-28 MED ORDER — ENOXAPARIN SODIUM 40 MG/0.4ML IJ SOSY: 40.0000 mg | PREFILLED_SYRINGE | INTRAMUSCULAR | 0 refills | Status: DC

## 2023-08-28 NOTE — Plan of Care (Addendum)
 Patient alert to self. Slurred speech difficulty in expressing needs. Calm & cooperative takes meds in applesauce one at a time must be in upright position. Take care when drinking liquids patient needs reminder to sip slowly or he begins coughing. All safety protocols maintained.   Problem: Clinical Measurements: Goal: Will remain free from infection Outcome: Progressing   Problem: Activity: Goal: Risk for activity intolerance will decrease Outcome: Progressing   Problem: Nutrition: Goal: Adequate nutrition will be maintained Outcome: Progressing   Problem: Elimination: Goal: Will not experience complications related to bowel motility Outcome: Progressing   Problem: Pain Management: Goal: General experience of comfort will improve Outcome: Progressing   Problem: Safety: Goal: Ability to remain free from injury will improve 08/28/2023 0607 by Noah Pao, RN Outcome: Progressing 08/28/2023 0601 by Noah Pao, RN Outcome: Progressing

## 2023-08-28 NOTE — TOC Transition Note (Addendum)
 Transition of Care [facility]) - Discharge Note   Patient Details  Name: Jeremy Browning MRN: [id number] Date of Birth: 1960-11-10  [facility] CM/SW Contact:  Donyea Gafford A Wilkie Zenon, RN Phone Number: 08/28/2023, 4:30 PM   Clinical Narrative:    Chart reviewed.  Noted that patient has orders for discharge today.  I h  I have spoken with Glenys with [facility].  She informs me that she will be able to accept patient back to the facility today.  Glenys reports that the number to call report is [phone] and patient will be going room 22A.  I have sent Tanya patient Discharge Summary, Discharge orders and SNF transfer packet.    I have informed patient and his brother Markos Theil that patient will be a discharge back to [facility] today. I have informed patient and Selinda that [facility] will transport patient to the facility today.    I have arranged [facility]  to transport patient  [facility] today.    I have informed staff nurse of the above information.     Final next level of care: Skilled Nursing Facility Barriers to Discharge: No Barriers Identified   Patient Goals and CMS Choice   CMS Medicare.gov Compare Post Acute Care list provided to:: Patient Choice offered to / list presented to : Patient      Discharge Placement              Patient chooses bed at: [facility] (Patient long term care resident.) Patient to be transferred to facility by: [facility] Name of family member notified: Derian Dimalanta Patient and family notified of of transfer: 08/28/23  Discharge Plan and Services Additional resources added to the After Visit Summary for                                       Social Drivers of Health (SDOH) Interventions SDOH Screenings   Food Insecurity: No Food Insecurity (08/23/2023)  Housing: Low Risk  (08/23/2023)  Transportation Needs: No Transportation Needs (08/23/2023)  Utilities: Not  At Risk (08/23/2023)  Depression (PHQ2-9): Low Risk  (09/16/2021)  Financial Resource Strain: Low Risk  (12/26/2019)   Received from [facility], [facility]  Physical Activity: Insufficiently Active (12/26/2019)   Received from [facility], [facility]  Social Connections: Moderately Isolated (12/26/2019)   Received from [facility], [facility]  Stress: Stress Concern Present (12/26/2019)   Received from [facility], [facility]  Tobacco Use: Medium Risk (08/21/2023)     Readmission Risk Interventions    08/23/2023   12:19 PM  Readmission Risk Prevention Plan  Transportation Screening Complete  PCP or Specialist Appt within 5-7 Days Complete  Medication Review (RN CM) Complete

## 2023-08-28 NOTE — Discharge Instructions (Signed)
 Can go back on Eliquis on 09/02/23.  Stop lovenox injections after dose on 09/01/23

## 2023-08-28 NOTE — Discharge Summary (Addendum)
 Physician Discharge Summary   Patient: Jeremy Browning MRN: [id number] DOB: [birth year]  Admit date:     08/21/2023  Discharge date: 08/28/23  Discharge Physician: Charlie Patterson   PCP: Fleeta Valeria Mayo, MD   Recommendations at discharge:    Follow up with physician at facility one day Check bmp and cbc one week  Discharge Diagnoses: Principal Problem:   Hypoglycemia Active Problems:   Breakthrough seizure (HCC)   Subdural hematoma (HCC)   Acute cystitis without hematuria   Acute metabolic encephalopathy   Chronic diastolic CHF (congestive heart failure) (HCC)   CAD (coronary artery disease)   Essential hypertension   COPD (chronic obstructive pulmonary disease) (HCC)   Hyperlipidemia   Type II diabetes mellitus with renal manifestations (HCC)   Stage 3a chronic kidney disease (HCC)   Atrial fibrillation, chronic (HCC)   Acute kidney injury superimposed on CKD (HCC)   Seizure (HCC)   History of stroke   Anemia of chronic disease   Left elbow pain   Hospital Course: 63 year old man past medical history of hypertension, hyperlipidemia, type 2 diabetes mellitus, COPD, CAD, diastolic congestive heart failure, atrial fibrillation on Eliquis , CKD stage IIIa, 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  Patient had breakthrough seizure was postictal last night.  1/1.  This morning I was able to speak with the patient but he was hard to understand.  Was able to move his right side.  Did not do too well with the nursing swallow test.  Speech therapy consult ordered.  Case discussed with neurology and will give IV Keppra  1000 mg twice a day and IV Depakote  for now. 1/2.  Patient lethargic with speech therapist and did not feel comfortable placing on a diet.  When I saw him he was able to answer few questions but kept his eyes closed.  EEG (negative for seizure) and MRI of the brain (showed 7 mm right occipital subdural hematoma).  CT scan of the  head shows unchanged subdural hematoma.  Appreciate neurosurgery consultation.  Holding blood thinners. 1/3.  Mental status much improved.  Switch seizure medications to oral.  Placed on diet. 1/4.  Fingerstick 44 this morning.  Patient given juice.  Creatinine improved to 1.26. 1/5   Fingerstick a.m. 48, repeat fingerstick at 11:45 was 35 and glucose was replaced before I was notified to check my hypoglycemic labs. 1/6.  Fingerstick 46 this morning.  When they drew the hypoglycemic labs glucose on the chemistry was 78.  Not sure if this is reading correctly.  Insulin  and C-peptide and Sophia urea screen pending.  Advised he must eat an evening snack. 1/7.  Fingerstick at 5 am good.  FS at  7:21 a little low but asymptomatic.  Not sure if FS are accurate since he is asymptomatic.  They were late with breakfast.  Patient must eat and evening snack and have early breakfast. C-peptide and insulin  normal.   Assessment and Plan: * Hypoglycemia Patient Must eat an evening snack and early breakfast.  Patient is asymptomatic with low sugar.  Continue to encourage eating and drinking.  Insulin  and c-peptide normal.  Can consider a dexcom monitor if patient having low sugars to check sugar at a different location from finger.  Not sure if FS is that accurate on him.  Check fingerstick if having altered mental status.   Subdural hematoma (HCC) Hold Eliquis  and aspirin .  Will give lovenox  dvt profylaxis until back on eliquis . Can restart Eliquis  in  on 09/02/23.  Breakthrough seizure (HCC) Mental status much improved since coming in.  Initially required IV medications and now on oral.  Keppra  increased to 100mg  po bid.  Acute cystitis without hematuria With Aerococcus growing out of culture switched Rocephin  over to amoxicillin  to complete course.  Also seen vancomycin -resistant Enterococcus in the culture.  Amoxicillin  should cover both.  Could be contaminant with 2 organisms in culture.  Acute metabolic  encephalopathy Improved from admission.  Unclear if this was secondary to subdural hematoma, breakthrough seizure or UTI.  CAD (coronary artery disease) Continue Lipitor   Chronic diastolic CHF (congestive heart failure) (HCC) No signs of heart failure.  Continue Lasix .  Hyperlipidemia Lipitor    COPD (chronic obstructive pulmonary disease) (HCC) As needed albuterol  nebulizer  Essential hypertension Continue Lasix .  Type II diabetes mellitus with renal manifestations (HCC) Chronic disease stage IIIa.  Last hemoglobin A1c 5.5.    Atrial fibrillation, chronic (HCC) Holding Eliquis   Left elbow pain No fracture on elbow x-ray.  Some swelling.  Continue to monitor.  Anemia of chronic disease Hemoglobin 8.9 and ferritin 236  History of stroke Left-sided paralysis.  Holding aspirin  and Eliquis  for right now.  Can go back on eliquis  on 09/01/22.  Acute kidney injury superimposed on CKD (HCC) AKI on CKD stage IIIa.  With IV fluids creatinine improved from a peak of 1.65 down to 1.10.         Consultants: neurology Procedures performed: none Disposition: Rehabilitation facility Diet recommendation:  Dysphagia 2 diet with thin liquids DISCHARGE MEDICATION: Allergies as of 08/28/2023   No Known Allergies      Medication List     STOP taking these medications    apixaban  5 MG Tabs tablet Commonly known as: ELIQUIS    aspirin  EC 81 MG tablet       TAKE these medications    amoxicillin  500 MG capsule Commonly known as: AMOXIL  Take 1 capsule (500 mg total) by mouth every 8 (eight) hours for 4 days.   atorvastatin  80 MG tablet Commonly known as: LIPITOR  Take 80 mg by mouth daily.   Carboxymethylcellulose Sod PF 1 % Gel Apply in both eyes in the morning and at bedtime.   cholecalciferol  25 MCG (1000 UNIT) tablet Commonly known as: VITAMIN D3 Take 1,000 Units by mouth daily.   diazepam  5 MG/ML injection Commonly known as: VALIUM  Inject 1 mL (5 mg total)  into the vein daily as needed.   diclofenac  Sodium 1 % Gel Commonly known as: VOLTAREN  Apply to left elbow tid   divalproex  250 MG DR tablet Commonly known as: Depakote  Take 3 tablets (750 mg total) by mouth 2 (two) times daily.   enoxaparin  40 MG/0.4ML injection Commonly known as: LOVENOX  Inject 0.4 mLs (40 mg total) into the skin daily for 4 days. Start taking on: August 29, 2023   ferrous sulfate  325 (65 FE) MG EC tablet Take 325 mg by mouth daily with breakfast.   furosemide  20 MG tablet Commonly known as: LASIX  Take 40 mg by mouth daily.   levETIRAcetam  1000 MG tablet Commonly known as: KEPPRA  Take 1 tablet (1,000 mg total) by mouth 2 (two) times daily. What changed:  medication strength how much to take   losartan  25 MG tablet Commonly known as: COZAAR  Take 25 mg by mouth daily.   melatonin 5 MG Tabs Take 5 mg by mouth at bedtime.   sertraline  25 MG tablet Commonly known as: ZOLOFT  Take 75 mg by mouth daily.   tamsulosin  0.4 MG  Caps capsule Commonly known as: FLOMAX  Take 0.4 mg by mouth daily.        Contact information for after-discharge care     Destination     [facility] .   Service: Skilled Nursing Contact information: [address] [place]  [phone]                    Discharge Exam: Fredricka Weights   08/25/23 0430 08/26/23 0356 08/28/23 0500  Weight: 83.7 kg 84.7 kg 85.5 kg   Physical Exam HENT:     Head: Normocephalic.     Mouth/Throat:     Pharynx: No oropharyngeal exudate.  Eyes:     General: Lids are normal.  Cardiovascular:     Rate and Rhythm: Normal rate and regular rhythm.     Heart sounds: S1 normal and S2 normal. Murmur heard.     Systolic murmur is present with a grade of 2/6.  Pulmonary:     Breath sounds: No decreased breath sounds, wheezing, rhonchi or rales.  Abdominal:     Palpations: Abdomen is soft.     Tenderness: There is no abdominal tenderness.   Musculoskeletal:     Left elbow: Swelling present.     Right lower leg: No swelling.     Left lower leg: No swelling.     Comments: Left arm contracted  Skin:    General: Skin is warm.     Findings: No rash.  Neurological:     Mental Status: He is alert.     Comments: Left-sided paralysis.  Able to squeeze my hand with his right hand..      Condition at discharge: fair  The results of significant diagnostics from this hospitalization (including imaging, microbiology, ancillary and laboratory) are listed below for reference.   Imaging Studies: DG Elbow 2 Views Left Result Date: 08/24/2023 CLINICAL DATA:  Left elbow pain. Patient's arms fixed at side and contracted. EXAM: LEFT ELBOW - 2 VIEW COMPARISON:  None Available. FINDINGS: Frontal view is limited by obliquity and overlap of the proximal radius, proximal ulna, and distal humerus. There is also twisting and overlap of the proximal radial and ulnar diaphyses. There may be minimal elevation of the distal anterior humeral fat pad and a small elbow joint effusion. No definitive acute fracture is seen. Mild chronic enthesopathic change at the common extensor tendon origin off the lateral epicondyle. IMPRESSION: 1. Limited examination due to limited patient positioning. 2. Possible small elbow joint effusion. No definitive acute fracture is seen. 3. Mild chronic enthesopathic change at the common extensor tendon origin off the lateral epicondyle. Electronically Signed   By: Tanda Lyons M.D.   On: 08/24/2023 13:59   CT HEAD WO CONTRAST ( ) Result Date: 08/23/2023 CLINICAL DATA:  Subdural hematoma.  Breakthrough seizure.  Headache. EXAM: CT HEAD WITHOUT CONTRAST TECHNIQUE: Contiguous axial images were obtained from the base of the skull through the vertex without intravenous contrast. RADIATION DOSE REDUCTION: This exam was performed according to the departmental dose-optimization program which includes automated exposure control, adjustment of  the mA and/or kV according to patient size and/or use of iterative reconstruction technique. COMPARISON:  Head CT 08/21/2023 and MRI 08/23/2023 FINDINGS: Brain: A small subdural hematoma over the right occipital convexity measures up to 7 mm in thickness, unchanged from today's earlier MRI and isodense to mildly hypodense to cortex. In retrospect, this is also likely unchanged from the prior CT, although motion artifact on that examination and  the density of the hematoma reduced its conspicuity. There is minimal mass effect on the right occipital lobe. Large chronic right MCA and small chronic right PCA infarcts are again noted. There is also unchanged left parieto-occipital encephalomalacia. There is asymmetric ex vacuo dilatation of the right lateral ventricle. No acute large territory infarct or midline shift is evident. Vascular: Calcified atherosclerosis at the skull base. Skull: No acute fracture or suspicious osseous lesion. Sinuses/Orbits: Visualized paranasal sinuses and mastoid air cells are clear. Visualized portions of the orbits are unremarkable. Other: None. IMPRESSION: 1. Unchanged small nonacute subdural hematoma over the right occipital convexity. 2. Unchanged chronic bilateral cerebral infarcts. Electronically Signed   By: Dasie Hamburg M.D.   On: 08/23/2023 18:50   EEG adult Result Date: 08/23/2023 Matthews Elida HERO, MD     08/23/2023  3:45 PM Routine EEG Report [place] Fassnacht is a 63 y.o. male with a history of seizure who is undergoing an EEG to evaluate for seizures. Report: This EEG was acquired with electrodes placed according to the International 10-20 electrode system (including Fp1, Fp2, F3, F4, C3, C4, P3, P4, O1, O2, T3, T4, T5, T6, A1, A2, Fz, Cz, Pz). The following electrodes were missing or displaced: none. The occipital dominant rhythm was 6-7 Hz. This activity is reactive to stimulation. Drowsiness was manifested by background fragmentation; deeper stages of sleep were identified by K  complexes and sleep spindles. There was right focal slowing. There were no interictal epileptiform discharges. There were no electrographic seizures identified. There was no abnormal response to photic stimulation or hyperventilation. Impression and clinical correlation: This EEG was obtained while awake and asleep and is abnormal due to mild-to-moderate diffuse slowing indicative of global cerebral dysfunction. There is additional focal slowing seen over the right hemisphere in the region of his prior infarct. Epileptiform abnormalities were not seen during this recording. Elida Matthews, MD [facility] [phone] If 7pm- 7am, please page neurology on call as listed in AMION.   MR BRAIN WO CONTRAST Result Date: 08/23/2023 CLINICAL DATA:  Seizure disorder, clinical change. EXAM: MRI HEAD WITHOUT CONTRAST TECHNIQUE: Multiplanar, multiecho pulse sequences of the brain and surrounding structures were obtained without intravenous contrast. COMPARISON:  MRI brain 03/06/2021.  Head CT 08/21/2023. FINDINGS: Brain: No acute infarct. Interval development of an acute right occipital convexity subdural hematoma, measuring up to 7 mm (axial image 20 series 9) with small interhemispheric component. Mild mass effect on the underlying right occipital lobe. Encephalomalacia from prior large right MCA territory and small right PCA territory infarcts. Unchanged cystic encephalomalacia in the left occipital lobe. No hydrocephalus or midline shift. Vascular: Normal flow voids. Skull and upper cervical spine: Normal marrow signal. Sinuses/Orbits: No acute findings. Mucous retention cyst in the right maxillary sinus. Other: None. IMPRESSION: 1. Interval development of an acute right occipital convexity subdural hematoma, measuring up to 7 mm with small interhemispheric component. Mild mass effect on the underlying right occipital lobe. No midline shift. 2. Encephalomalacia from prior large right MCA territory and small  right PCA territory infarcts. Unchanged cystic encephalomalacia in the left occipital lobe. Electronically Signed   By: Ryan Chess M.D.   On: 08/23/2023 13:26   DG Chest Port 1 View Result Date: 08/23/2023 CLINICAL DATA:  Acute metabolic encephalopathy. EXAM: PORTABLE CHEST 1 VIEW COMPARISON:  Radiographs 08/21/2023 and 02/15/2023. FINDINGS: 1035 hours. The heart size and mediastinal contours are stable with aortic atherosclerosis. The lungs remain clear. There is no pleural effusion or pneumothorax. No acute osseous findings  are evident. The left costophrenic angle is incompletely visualized by this portable examination. IMPRESSION: No evidence of acute cardiopulmonary process. Aortic atherosclerosis. Electronically Signed   By: Elsie Perone M.D.   On: 08/23/2023 12:05   CT HEAD WO CONTRAST ( ) Result Date: 08/22/2023 CLINICAL DATA:  Seizure disorder EXAM: CT HEAD WITHOUT CONTRAST TECHNIQUE: Contiguous axial images were obtained from the base of the skull through the vertex without intravenous contrast. RADIATION DOSE REDUCTION: This exam was performed according to the departmental dose-optimization program which includes automated exposure control, adjustment of the mA and/or kV according to patient size and/or use of iterative reconstruction technique. COMPARISON:  11/21/2022 FINDINGS: Brain: Changes consistent with prior right MCA infarct and generalized atrophic changes. Prior left occipital infarct is noted as well. No new acute hemorrhage, acute infarction or space-occupying mass lesion is noted. Vascular: No hyperdense vessel or unexpected calcification. Skull: Normal. Negative for fracture or focal lesion. Sinuses/Orbits: No acute finding. Other: None. IMPRESSION: Chronic atrophic and ischemic changes stable from the prior exam. No acute abnormality noted. Electronically Signed   By: Oneil Devonshire M.D.   On: 08/22/2023 00:48   DG Chest Port 1 View Result Date: 08/21/2023 CLINICAL DATA:   aspiration? EXAM: PORTABLE CHEST 1 VIEW COMPARISON:  Chest x-ray 02/15/2023 FINDINGS: The heart and mediastinal contours are within normal limits. Atherosclerotic plaque. Low lung volumes. No focal consolidation. No pulmonary edema. No pleural effusion. No pneumothorax. No acute osseous abnormality. IMPRESSION: 1. Low lung volumes with no active disease. 2.  Aortic Atherosclerosis (ICD10-I70.0). Electronically Signed   By: Morgane  Naveau M.D.   On: 08/21/2023 22:58    Microbiology: Results for orders placed or performed during the hospital encounter of 08/21/23  Urine Culture     Status: Abnormal   Collection Time: 08/23/23 10:25 AM   Specimen: Urine, Random  Result Value Ref Range Status   Specimen Description   Final    URINE, RANDOM Performed at [facility], [address] [place], [place] [address]    Special Requests   Final    NONE Reflexed from [id number] Performed at [facility], [address] [place], [place] [address]    Culture (A)  Final    >=100,000 COLONIES/mL AEROCOCCUS SPECIES Standardized susceptibility testing for this organism is not available. 40,000 COLONIES/mL ENTEROCOCCUS FAECALIS VANCOMYCIN  RESISTANT ENTEROCOCCUS ISOLATED    Report Status 08/27/2023 FINAL  Final   Organism ID, Bacteria ENTEROCOCCUS FAECALIS (A)  Final      Susceptibility   Enterococcus faecalis - MIC*    AMPICILLIN  <=2 SENSITIVE Sensitive     NITROFURANTOIN <=16 SENSITIVE Sensitive     VANCOMYCIN  >=32 RESISTANT Resistant     LINEZOLID 1 SENSITIVE Sensitive     * 40,000 COLONIES/mL ENTEROCOCCUS FAECALIS    Labs: CBC: Recent Labs  Lab 08/21/23 2114 08/22/23 0355 08/23/23 0539 08/27/23 0528  WBC 6.6 7.7 7.5 6.4  HGB 10.3* 10.1* 9.5* 8.9*  HCT 32.6* 32.3* 30.8* 28.1*  MCV 91.1 93.1 93.6 89.5  PLT 226 208 150 156   Basic Metabolic Panel: Recent Labs  Lab 08/22/23 0355 08/23/23 0539 08/25/23 0923 08/26/23 0753 08/27/23 0528  NA 141 140 145 140 140  K 4.1  3.7 3.7 4.1 4.4  CL 103 105 109 105 102  CO2 28 23 28 28 29   GLUCOSE 135* 153* 95 80 78  BUN 36* 35* 25* 22 22  CREATININE 1.65* 1.60* 1.26* 1.16 1.10  CALCIUM  8.7* 8.6* 8.7* 8.3* 8.2*   Liver Function  Tests: Recent Labs  Lab 08/21/23 2114  AST 27  ALT 12  ALKPHOS 52  BILITOT 0.8  PROT 7.5  ALBUMIN 3.3*   CBG: Recent Labs  Lab 08/27/23 2354 08/28/23 0429 08/28/23 0721 08/28/23 0742 08/28/23 0911  GLUCAP 96 79 52* 78 84    Discharge time spent: greater than 30 minutes.  Signed: Charlie Patterson, MD [facility] 08/28/2023

## 2023-08-28 NOTE — Plan of Care (Deleted)
 Patient alert to self. Has difficulty with verbally expressing needs. Calm and cooperative takes medications one at a time in appl Problem: Clinical Measurements: Goal: Will remain free from infection Outcome: Progressing   Problem: Activity: Goal: Risk for activity intolerance will decrease Outcome: Progressing   Problem: Nutrition: Goal: Adequate nutrition will be maintained Outcome: Progressing   Problem: Elimination: Goal: Will not experience complications related to bowel motility Outcome: Progressing   Problem: Pain Management: Goal: General experience of comfort will improve Outcome: Progressing   Problem: Safety: Goal: Ability to remain free from injury will improve 08/28/2023 0607 by Noah Pao, RN Outcome: Progressing 08/28/2023 0601 by Noah Pao, RN Outcome: Progressing

## 2023-08-28 NOTE — Progress Notes (Signed)
 SLP Cancellation Note  Patient Details Name: Jeremy Browning MRN: [id number] DOB: 10/07/1960   Cancelled treatment:       Reason Eval/Treat Not Completed: Fatigue/lethargy limiting ability to participate   Pt continues with waxing/waning lethargy which is not appropriate for neither cognitive-linguistic tx nor diet advancement. Noted pt with hx of oral dysphagia and a Dysphagia 2 Diet with recommended in 2023. Suspect pt's oral dysphagia is chronic. Recommend post-acute SLP services for further functional cognitive-linguistic eval/tx and dysphagia management. SLP to sign off in acute acute.  Jeremy Browning, M.S., CCC-SLP Speech-Language Pathologist [facility] [phone] [place])  Jeremy CHRISTELLA Browning 08/28/2023, 8:30 AM

## 2023-09-04 LAB — SULFONYLUREA HYPOGLYCEMICS PANEL, SERUM
Acetohexamide: NEGATIVE ug/mL (ref 20–60)
Chlorpropamide: NEGATIVE ug/mL (ref 75–250)
Glimepiride: NEGATIVE ng/mL (ref 80–250)
Glipizide: NEGATIVE ng/mL (ref 200–1000)
Glyburide: NEGATIVE ng/mL
Nateglinide: NEGATIVE ng/mL
Repaglinide: NEGATIVE ng/mL
Tolazamide: NEGATIVE ug/mL
Tolbutamide: NEGATIVE ug/mL (ref 40–100)

## 2023-09-24 ENCOUNTER — Other Ambulatory Visit: Payer: Self-pay | Admitting: Student

## 2023-09-24 DIAGNOSIS — S065XAA Traumatic subdural hemorrhage with loss of consciousness status unknown, initial encounter: Secondary | ICD-10-CM

## 2023-11-16 ENCOUNTER — Emergency Department

## 2023-11-16 ENCOUNTER — Inpatient Hospital Stay

## 2023-11-16 ENCOUNTER — Inpatient Hospital Stay
Admission: EM | Admit: 2023-11-16 | Discharge: 2023-11-20 | DRG: 208 | Disposition: E | Attending: Internal Medicine | Admitting: Internal Medicine

## 2023-11-16 ENCOUNTER — Other Ambulatory Visit: Payer: Self-pay

## 2023-11-16 DIAGNOSIS — M6282 Rhabdomyolysis: Secondary | ICD-10-CM | POA: Diagnosis present

## 2023-11-16 DIAGNOSIS — R001 Bradycardia, unspecified: Secondary | ICD-10-CM | POA: Diagnosis present

## 2023-11-16 DIAGNOSIS — I2510 Atherosclerotic heart disease of native coronary artery without angina pectoris: Secondary | ICD-10-CM | POA: Diagnosis present

## 2023-11-16 DIAGNOSIS — A419 Sepsis, unspecified organism: Principal | ICD-10-CM

## 2023-11-16 DIAGNOSIS — J9601 Acute respiratory failure with hypoxia: Secondary | ICD-10-CM | POA: Diagnosis present

## 2023-11-16 DIAGNOSIS — N179 Acute kidney failure, unspecified: Secondary | ICD-10-CM | POA: Diagnosis not present

## 2023-11-16 DIAGNOSIS — E872 Acidosis, unspecified: Secondary | ICD-10-CM | POA: Diagnosis present

## 2023-11-16 DIAGNOSIS — R571 Hypovolemic shock: Secondary | ICD-10-CM | POA: Diagnosis present

## 2023-11-16 DIAGNOSIS — I69354 Hemiplegia and hemiparesis following cerebral infarction affecting left non-dominant side: Secondary | ICD-10-CM

## 2023-11-16 DIAGNOSIS — J939 Pneumothorax, unspecified: Secondary | ICD-10-CM

## 2023-11-16 DIAGNOSIS — N390 Urinary tract infection, site not specified: Secondary | ICD-10-CM | POA: Diagnosis present

## 2023-11-16 DIAGNOSIS — Z87891 Personal history of nicotine dependence: Secondary | ICD-10-CM

## 2023-11-16 DIAGNOSIS — N1831 Chronic kidney disease, stage 3a: Secondary | ICD-10-CM | POA: Diagnosis present

## 2023-11-16 DIAGNOSIS — I469 Cardiac arrest, cause unspecified: Secondary | ICD-10-CM

## 2023-11-16 DIAGNOSIS — I255 Ischemic cardiomyopathy: Secondary | ICD-10-CM | POA: Diagnosis present

## 2023-11-16 DIAGNOSIS — E785 Hyperlipidemia, unspecified: Secondary | ICD-10-CM | POA: Diagnosis present

## 2023-11-16 DIAGNOSIS — Z1152 Encounter for screening for COVID-19: Secondary | ICD-10-CM | POA: Diagnosis not present

## 2023-11-16 DIAGNOSIS — I482 Chronic atrial fibrillation, unspecified: Secondary | ICD-10-CM | POA: Diagnosis present

## 2023-11-16 DIAGNOSIS — I5032 Chronic diastolic (congestive) heart failure: Secondary | ICD-10-CM | POA: Diagnosis present

## 2023-11-16 DIAGNOSIS — Z8249 Family history of ischemic heart disease and other diseases of the circulatory system: Secondary | ICD-10-CM

## 2023-11-16 DIAGNOSIS — E1122 Type 2 diabetes mellitus with diabetic chronic kidney disease: Secondary | ICD-10-CM | POA: Diagnosis present

## 2023-11-16 DIAGNOSIS — J440 Chronic obstructive pulmonary disease with acute lower respiratory infection: Secondary | ICD-10-CM | POA: Diagnosis present

## 2023-11-16 DIAGNOSIS — Z823 Family history of stroke: Secondary | ICD-10-CM

## 2023-11-16 DIAGNOSIS — R6521 Severe sepsis with septic shock: Secondary | ICD-10-CM | POA: Diagnosis present

## 2023-11-16 DIAGNOSIS — G9341 Metabolic encephalopathy: Secondary | ICD-10-CM | POA: Diagnosis present

## 2023-11-16 DIAGNOSIS — Z7984 Long term (current) use of oral hypoglycemic drugs: Secondary | ICD-10-CM

## 2023-11-16 DIAGNOSIS — E870 Hyperosmolality and hypernatremia: Secondary | ICD-10-CM | POA: Diagnosis present

## 2023-11-16 DIAGNOSIS — Z79899 Other long term (current) drug therapy: Secondary | ICD-10-CM

## 2023-11-16 DIAGNOSIS — R4182 Altered mental status, unspecified: Secondary | ICD-10-CM | POA: Diagnosis not present

## 2023-11-16 DIAGNOSIS — J690 Pneumonitis due to inhalation of food and vomit: Secondary | ICD-10-CM | POA: Diagnosis present

## 2023-11-16 DIAGNOSIS — I2729 Other secondary pulmonary hypertension: Secondary | ICD-10-CM | POA: Diagnosis present

## 2023-11-16 DIAGNOSIS — Z515 Encounter for palliative care: Secondary | ICD-10-CM

## 2023-11-16 DIAGNOSIS — I130 Hypertensive heart and chronic kidney disease with heart failure and stage 1 through stage 4 chronic kidney disease, or unspecified chronic kidney disease: Secondary | ICD-10-CM | POA: Diagnosis present

## 2023-11-16 DIAGNOSIS — Z7901 Long term (current) use of anticoagulants: Secondary | ICD-10-CM

## 2023-11-16 DIAGNOSIS — K7200 Acute and subacute hepatic failure without coma: Secondary | ICD-10-CM | POA: Diagnosis present

## 2023-11-16 DIAGNOSIS — I462 Cardiac arrest due to underlying cardiac condition: Secondary | ICD-10-CM | POA: Diagnosis present

## 2023-11-16 DIAGNOSIS — Z833 Family history of diabetes mellitus: Secondary | ICD-10-CM

## 2023-11-16 DIAGNOSIS — D631 Anemia in chronic kidney disease: Secondary | ICD-10-CM | POA: Diagnosis present

## 2023-11-16 DIAGNOSIS — G4730 Sleep apnea, unspecified: Secondary | ICD-10-CM | POA: Diagnosis present

## 2023-11-16 DIAGNOSIS — J189 Pneumonia, unspecified organism: Secondary | ICD-10-CM | POA: Diagnosis present

## 2023-11-16 DIAGNOSIS — Z66 Do not resuscitate: Secondary | ICD-10-CM | POA: Diagnosis present

## 2023-11-16 DIAGNOSIS — R7989 Other specified abnormal findings of blood chemistry: Secondary | ICD-10-CM | POA: Diagnosis not present

## 2023-11-16 DIAGNOSIS — E876 Hypokalemia: Secondary | ICD-10-CM | POA: Diagnosis present

## 2023-11-16 LAB — COMPREHENSIVE METABOLIC PANEL WITH GFR
ALT: 275 U/L — ABNORMAL HIGH (ref 0–44)
AST: 602 U/L — ABNORMAL HIGH (ref 15–41)
Albumin: 2 g/dL — ABNORMAL LOW (ref 3.5–5.0)
Alkaline Phosphatase: 41 U/L (ref 38–126)
Anion gap: 18 — ABNORMAL HIGH (ref 5–15)
BUN: 51 mg/dL — ABNORMAL HIGH (ref 8–23)
CO2: 18 mmol/L — ABNORMAL LOW (ref 22–32)
Calcium: 8.4 mg/dL — ABNORMAL LOW (ref 8.9–10.3)
Chloride: 111 mmol/L (ref 98–111)
Creatinine, Ser: 2.43 mg/dL — ABNORMAL HIGH (ref 0.61–1.24)
GFR, Estimated: 29 mL/min — ABNORMAL LOW (ref >60)
Glucose, Bld: 166 mg/dL — ABNORMAL HIGH (ref 70–99)
Potassium: 2.7 mmol/L — CL (ref 3.5–5.1)
Sodium: 147 mmol/L — ABNORMAL HIGH (ref 135–145)
Total Bilirubin: 1 mg/dL (ref 0.0–1.2)
Total Protein: 5.4 g/dL — ABNORMAL LOW (ref 6.5–8.1)

## 2023-11-16 LAB — URINE DRUG SCREEN, QUALITATIVE (ARMC ONLY)
Amphetamines, Ur Screen: NOT DETECTED
Barbiturates, Ur Screen: NOT DETECTED
Benzodiazepine, Ur Scrn: POSITIVE — AB
Cannabinoid 50 Ng, Ur ~~LOC~~: NOT DETECTED
Cocaine Metabolite,Ur ~~LOC~~: NOT DETECTED
MDMA (Ecstasy)Ur Screen: NOT DETECTED
Methadone Scn, Ur: NOT DETECTED
Opiate, Ur Screen: NOT DETECTED
Phencyclidine (PCP) Ur S: NOT DETECTED
Tricyclic, Ur Screen: NOT DETECTED

## 2023-11-16 LAB — TROPONIN I (HIGH SENSITIVITY)
Troponin I (High Sensitivity): 143 ng/L (ref <18)
Troponin I (High Sensitivity): 1390 ng/L (ref <18)

## 2023-11-16 LAB — URINALYSIS, W/ REFLEX TO CULTURE (INFECTION SUSPECTED)
Bacteria, UA: NONE SEEN
Bilirubin Urine: NEGATIVE
Glucose, UA: NEGATIVE mg/dL
Ketones, ur: NEGATIVE mg/dL
Nitrite: NEGATIVE
Protein, ur: 30 mg/dL — AB
Specific Gravity, Urine: 1.014 (ref 1.005–1.030)
Squamous Epithelial / HPF: 0 /HPF (ref 0–5)
WBC, UA: >50 WBC/hpf (ref 0–5)
pH: 5 (ref 5.0–8.0)

## 2023-11-16 LAB — CK: Total CK: 823 U/L — ABNORMAL HIGH (ref 49–397)

## 2023-11-16 LAB — BLOOD GAS, ARTERIAL
Acid-base deficit: 7.7 mmol/L — ABNORMAL HIGH (ref 0.0–2.0)
Bicarbonate: 21.3 mmol/L (ref 20.0–28.0)
FIO2: 100 %
MECHVT: 500 mL
Mechanical Rate: 20
O2 Saturation: 79.9 %
PEEP: 10 cmH2O
Patient temperature: 37
pCO2 arterial: 57 mmHg — ABNORMAL HIGH (ref 32–48)
pH, Arterial: 7.18 — CL (ref 7.35–7.45)
pO2, Arterial: 56 mmHg — ABNORMAL LOW (ref 83–108)

## 2023-11-16 LAB — HEPATITIS PANEL, ACUTE
HCV Ab: NONREACTIVE
Hep A IgM: NONREACTIVE
Hep B C IgM: NONREACTIVE
Hepatitis B Surface Ag: NONREACTIVE

## 2023-11-16 LAB — CBG MONITORING, ED
Glucose-Capillary: 53 mg/dL — ABNORMAL LOW (ref 70–99)
Glucose-Capillary: 65 mg/dL — ABNORMAL LOW (ref 70–99)
Glucose-Capillary: 113 mg/dL — ABNORMAL HIGH (ref 70–99)

## 2023-11-16 LAB — CBC
HCT: 28.6 % — ABNORMAL LOW (ref 39.0–52.0)
Hemoglobin: 8.4 g/dL — ABNORMAL LOW (ref 13.0–17.0)
MCH: 28.5 pg (ref 26.0–34.0)
MCHC: 29.4 g/dL — ABNORMAL LOW (ref 30.0–36.0)
MCV: 96.9 fL (ref 80.0–100.0)
Platelets: 266 10*3/uL (ref 150–400)
RBC: 2.95 MIL/uL — ABNORMAL LOW (ref 4.22–5.81)
RDW: 17.9 % — ABNORMAL HIGH (ref 11.5–15.5)
WBC: 2.8 10*3/uL — ABNORMAL LOW (ref 4.0–10.5)
nRBC: 1.1 % — ABNORMAL HIGH (ref 0.0–0.2)

## 2023-11-16 LAB — LACTIC ACID, PLASMA: Lactic Acid, Venous: 8.5 mmol/L (ref 0.5–1.9)

## 2023-11-16 LAB — MAGNESIUM: Magnesium: 2.3 mg/dL (ref 1.7–2.4)

## 2023-11-16 LAB — PROTIME-INR
INR: 3.1 — ABNORMAL HIGH (ref 0.8–1.2)
Prothrombin Time: 32.5 s — ABNORMAL HIGH (ref 11.4–15.2)

## 2023-11-16 LAB — GLUCOSE, CAPILLARY: Glucose-Capillary: 82 mg/dL (ref 70–99)

## 2023-11-16 LAB — RESP PANEL BY RT-PCR (RSV, FLU A&B, COVID)  RVPGX2
Influenza A by PCR: NEGATIVE
Influenza B by PCR: NEGATIVE
Resp Syncytial Virus by PCR: NEGATIVE
SARS Coronavirus 2 by RT PCR: NEGATIVE

## 2023-11-16 LAB — SALICYLATE LEVEL: Salicylate Lvl: <7 mg/dL — ABNORMAL LOW (ref 7.0–30.0)

## 2023-11-16 LAB — ACETAMINOPHEN LEVEL: Acetaminophen (Tylenol), Serum: <10 ug/mL — ABNORMAL LOW (ref 10–30)

## 2023-11-16 LAB — ETHANOL: Alcohol, Ethyl (B): <10 mg/dL (ref <10)

## 2023-11-16 MED ORDER — SODIUM BICARBONATE 8.4 % IV SOLN: 50.0000 meq | Freq: Once | INTRAVENOUS | Status: AC

## 2023-11-16 MED ORDER — DEXTROSE 50 % IV SOLN
INTRAVENOUS | Status: AC
  Administered 2023-11-16: 50 mL
  Filled 2023-11-16: qty 50

## 2023-11-16 MED ORDER — PIPERACILLIN-TAZOBACTAM 3.375 G IVPB
3.3750 g | Freq: Three times a day (TID) | INTRAVENOUS | Status: DC
  Administered 2023-11-16: 3.375 g via INTRAVENOUS
  Filled 2023-11-16: qty 50

## 2023-11-16 MED ORDER — HYDROCORTISONE SOD SUC (PF) 100 MG IJ SOLR
100.0000 mg | Freq: Three times a day (TID) | INTRAMUSCULAR | Status: DC
  Administered 2023-11-16: 100 mg via INTRAVENOUS
  Filled 2023-11-16 (×3): qty 2

## 2023-11-16 MED ORDER — PHENYLEPHRINE 80 MCG/ML (10ML) SYRINGE FOR IV PUSH (FOR BLOOD PRESSURE SUPPORT)
PREFILLED_SYRINGE | INTRAVENOUS | Status: AC
  Filled 2023-11-16: qty 10

## 2023-11-16 MED ORDER — SODIUM BICARBONATE 8.4 % IV SOLN
50.0000 meq | Freq: Once | INTRAVENOUS | Status: AC
  Administered 2023-11-16: 50 meq via INTRAVENOUS

## 2023-11-16 MED ORDER — VANCOMYCIN HCL 1750 MG/350ML IV SOLN
1750.0000 mg | Freq: Once | INTRAVENOUS | Status: DC
  Filled 2023-11-16: qty 350

## 2023-11-16 MED ORDER — AMIODARONE HCL IN DEXTROSE 360-4.14 MG/200ML-% IV SOLN
60.0000 mg/h | INTRAVENOUS | Status: DC
  Administered 2023-11-16: 60 mg/h via INTRAVENOUS
  Filled 2023-11-16: qty 200

## 2023-11-16 MED ORDER — EPINEPHRINE 1 MG/10ML IJ SOSY
PREFILLED_SYRINGE | INTRAMUSCULAR | Status: AC | PRN
  Administered 2023-11-16: 1 mg via INTRAVENOUS

## 2023-11-16 MED ORDER — DEXTROSE 10 % IV BOLUS
250.0000 mL | Freq: Once | INTRAVENOUS | Status: DC
  Filled 2023-11-16: qty 500

## 2023-11-16 MED ORDER — POLYETHYLENE GLYCOL 3350 17 G PO PACK: 17.0000 g | PACK | Freq: Every day | ORAL | Status: DC | PRN

## 2023-11-16 MED ORDER — PROPOFOL 1000 MG/100ML IV EMUL
INTRAVENOUS | Status: AC
  Filled 2023-11-16: qty 100

## 2023-11-16 MED ORDER — AMIODARONE HCL IN DEXTROSE 360-4.14 MG/200ML-% IV SOLN: 30.0000 mg/h | INTRAVENOUS | Status: DC

## 2023-11-16 MED ORDER — SODIUM CHLORIDE 0.9 % IV SOLN: 250.0000 mL | INTRAVENOUS | Status: DC

## 2023-11-16 MED ORDER — SODIUM BICARBONATE 8.4 % IV SOLN
INTRAVENOUS | Status: AC | PRN
  Administered 2023-11-16: 50 meq via INTRAVENOUS

## 2023-11-16 MED ORDER — STERILE WATER FOR INJECTION IV SOLN
INTRAVENOUS | Status: DC
  Filled 2023-11-16: qty 1000
  Filled 2023-11-16: qty 150

## 2023-11-16 MED ORDER — VASOPRESSIN 20 UNITS/100 ML INFUSION FOR SHOCK
0.0000 [IU]/min | INTRAVENOUS | Status: DC
  Administered 2023-11-16: 0.04 [IU]/min via INTRAVENOUS
  Administered 2023-11-16: 0.03 [IU]/min via INTRAVENOUS
  Filled 2023-11-16 (×2): qty 100

## 2023-11-16 MED ORDER — LACTATED RINGERS IV BOLUS
500.0000 mL | Freq: Once | INTRAVENOUS | Status: AC
  Administered 2023-11-16: 500 mL via INTRAVENOUS

## 2023-11-16 MED ORDER — SODIUM BICARBONATE 8.4 % IV SOLN
INTRAVENOUS | Status: AC
  Administered 2023-11-16: 50 meq via INTRAVENOUS
  Filled 2023-11-16: qty 100

## 2023-11-16 MED ORDER — METRONIDAZOLE 500 MG/100ML IV SOLN
500.0000 mg | Freq: Once | INTRAVENOUS | Status: AC
  Administered 2023-11-16: 500 mg via INTRAVENOUS
  Filled 2023-11-16: qty 100

## 2023-11-16 MED ORDER — PHENYLEPHRINE HCL-NACL 20-0.9 MG/250ML-% IV SOLN
0.0000 ug/min | INTRAVENOUS | Status: DC
  Administered 2023-11-16: 30 ug/min via INTRAVENOUS
  Administered 2023-11-16: 20 ug/min via INTRAVENOUS
  Filled 2023-11-16: qty 250

## 2023-11-16 MED ORDER — POTASSIUM CHLORIDE 10 MEQ/100ML IV SOLN
10.0000 meq | INTRAVENOUS | Status: AC
  Administered 2023-11-16 (×3): 10 meq via INTRAVENOUS
  Filled 2023-11-16 (×6): qty 100

## 2023-11-16 MED ORDER — AMIODARONE LOAD VIA INFUSION
150.0000 mg | Freq: Once | INTRAVENOUS | Status: AC
  Administered 2023-11-16: 150 mg via INTRAVENOUS
  Filled 2023-11-16: qty 83.34

## 2023-11-16 MED ORDER — LACTATED RINGERS IV BOLUS
1600.0000 mL | Freq: Once | INTRAVENOUS | Status: AC
  Administered 2023-11-16: 1600 mL via INTRAVENOUS

## 2023-11-16 MED ORDER — EPINEPHRINE HCL 5 MG/250ML IV SOLN IN NS
0.5000 ug/min | INTRAVENOUS | Status: DC
  Filled 2023-11-16: qty 250

## 2023-11-16 MED ORDER — DOCUSATE SODIUM 100 MG PO CAPS: 100.0000 mg | ORAL_CAPSULE | Freq: Two times a day (BID) | ORAL | Status: DC | PRN

## 2023-11-16 MED ORDER — PHENYLEPHRINE CONCENTRATED 100MG/250ML (0.4 MG/ML) INFUSION SIMPLE
0.0000 ug/min | INTRAVENOUS | Status: DC
  Administered 2023-11-16: 90 ug/min via INTRAVENOUS
  Filled 2023-11-16 (×2): qty 250

## 2023-11-16 MED ORDER — ACETAMINOPHEN 650 MG RE SUPP: 650.0000 mg | Freq: Once | RECTAL | Status: DC

## 2023-11-16 MED ORDER — ROCURONIUM BROMIDE 10 MG/ML (PF) SYRINGE
80.0000 mg | PREFILLED_SYRINGE | Freq: Once | INTRAVENOUS | Status: AC
  Administered 2023-11-16: 80 mg via INTRAVENOUS

## 2023-11-16 MED ORDER — NOREPINEPHRINE 4 MG/250ML-% IV SOLN
2.0000 ug/min | INTRAVENOUS | Status: DC
Start: 2023-11-16 — End: 2023-11-16
  Administered 2023-11-16: 8 ug/min via INTRAVENOUS
  Administered 2023-11-16: 15 ug/min via INTRAVENOUS

## 2023-11-16 MED ORDER — SODIUM BICARBONATE 8.4 % IV SOLN
50.0000 meq | Freq: Once | INTRAVENOUS | Status: AC
  Administered 2023-11-16: 50 meq via INTRAVENOUS
  Filled 2023-11-16: qty 50

## 2023-11-16 MED ORDER — FENTANYL 2500MCG IN NS 250ML (10MCG/ML) PREMIX INFUSION
50.0000 ug/h | INTRAVENOUS | Status: DC
Start: 2023-11-16 — End: 2023-11-17
  Administered 2023-11-16: 50 ug/h via INTRAVENOUS
  Filled 2023-11-16: qty 250

## 2023-11-16 MED ORDER — ETOMIDATE 2 MG/ML IV SOLN
30.0000 mg | Freq: Once | INTRAVENOUS | Status: AC
  Administered 2023-11-16: 30 mg via INTRAVENOUS

## 2023-11-16 MED ORDER — FENTANYL CITRATE PF 50 MCG/ML IJ SOSY
50.0000 ug | PREFILLED_SYRINGE | Freq: Once | INTRAMUSCULAR | Status: DC
  Filled 2023-11-16: qty 1

## 2023-11-16 MED ORDER — NOREPINEPHRINE 4 MG/250ML-% IV SOLN
INTRAVENOUS | Status: AC
  Filled 2023-11-16: qty 250

## 2023-11-16 MED ORDER — PHENYLEPHRINE 80 MCG/ML (10ML) SYRINGE FOR IV PUSH (FOR BLOOD PRESSURE SUPPORT): 80.0000 ug | PREFILLED_SYRINGE | Freq: Once | INTRAVENOUS | Status: DC | PRN

## 2023-11-16 MED ORDER — LACTATED RINGERS IV BOLUS
1000.0000 mL | Freq: Once | INTRAVENOUS | Status: AC
  Administered 2023-11-16: 1000 mL via INTRAVENOUS

## 2023-11-16 MED ORDER — DEXTROSE 5 % IV BOLUS
250.0000 mL | Freq: Once | INTRAVENOUS | Status: AC
  Administered 2023-11-16: 250 mL via INTRAVENOUS
  Filled 2023-11-16: qty 250

## 2023-11-16 MED ORDER — FENTANYL BOLUS VIA INFUSION: 50.0000 ug | INTRAVENOUS | Status: DC | PRN

## 2023-11-16 MED ORDER — SODIUM BICARBONATE 8.4 % IV SOLN
INTRAVENOUS | Status: AC
  Administered 2023-11-16: 50 meq via INTRAVENOUS
  Filled 2023-11-16: qty 50

## 2023-11-16 MED ORDER — NOREPINEPHRINE 16 MG/250ML-% IV SOLN
0.0000 ug/min | INTRAVENOUS | Status: DC
  Administered 2023-11-16: 30 ug/min via INTRAVENOUS
  Filled 2023-11-16 (×2): qty 250

## 2023-11-16 MED ORDER — EPINEPHRINE 1 MG/10ML IJ SOSY
PREFILLED_SYRINGE | INTRAMUSCULAR | Status: AC | PRN
Start: 2023-11-16 — End: 2023-11-16
  Administered 2023-11-16: 1 mg via INTRAVENOUS

## 2023-11-17 LAB — BLOOD CULTURE ID PANEL (REFLEXED) - BCID2

## 2023-11-18 LAB — URINE CULTURE

## 2023-11-19 LAB — CULTURE, BLOOD (ROUTINE X 2)
Special Requests: ADEQUATE
Special Requests: ADEQUATE

## 2023-11-20 NOTE — ED Notes (Signed)
 MD using BVM at this time prior to intubation

## 2023-11-20 NOTE — Death Summary Note (Signed)
 DEATH SUMMARY   Patient Details  Name: Jeremy Browning MRN: [id number] DOB: 11-17-1960  Admission/Discharge Information   Admit Date:  2023-11-29  Date of Death:  November 29, 2023  Time of Death:  15:56  Length of Stay: 0  Referring Physician: Crist Fat, MD   Reason(s) for Hospitalization  Aspiration Multifactorial Shock: Hypovolemic vs Septic vs Cardiogenic Ischemic Cardiomyopathy Chronic HFpEF Acute Hypoxic Respiratory Failure Small Left sided Pneumothorax Severe Sepsis UTI Acute Kidney Injury Rhabdomyolysis Hypokalemia Hypernatremia Anion Gap Metabolic Acidosis Lactic Acidosis Shock Liver Anemia of chronic disease Supra therapeutic INR Diabetes Mellitus Acute Metabolic Encephalopathy   Diagnoses  Preliminary cause of death: Aspiration Secondary Diagnoses (including complications and co-morbidities):  Principal Problem:   Altered mental status, unspecified Active Problems:   Septic shock (HCC)   Elevated lactic acid level   Elevated LFTs   Pneumothorax Multifactorial Shock: Hypovolemic vs Septic vs Cardiogenic Ischemic Cardiomyopathy Chronic HFpEF Acute Hypoxic Respiratory Failure Small Left sided Pneumothorax Severe Sepsis UTI Acute Kidney Injury Rhabdomyolysis Hypokalemia Hypernatremia Anion Gap Metabolic Acidosis Lactic Acidosis Shock Liver Anemia of chronic disease Supra therapeutic INR Diabetes Mellitus Acute Metabolic Encephalopathy  Brief Hospital Course (including significant findings, care, treatment, and services provided and events leading to death)  Pierson Vantol is a 63 y.o. 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 2025 who presented to [facility] ED on November 29, 2023 from his nursing facility due to altered mental status, acute respiratory distress, and severe hypoxia.    Pt is currently  intubated and unable to contribute to history and no family is currently available, therefore history is obtained from chart review.  Per ED and nursing notes, nursing staff at the facility found him altered and minimally responsive this morning.  Upon EMS arrival, he was found to have gurgling respirations and oxygen saturations less than 50%, and was febrile.  Nursing staff reported that he had an aspiration event yesterday at his facility, but that chest xray had been performed and was reportedly normal.   Of note he was recently admitted at [facility] from 08/21/23 through 08/28/23 for breakthrough seizures, 7 mm right occipital subdural hematoma, acute cystitis (Aerococcus and VRE on cultures, completed course of Amoxicillin), and Acute Metabolic Encephalopathy.  He was discharged to rehab and plan was to restart his Eliquis on 09/02/23.   ED Course: Initial Vital Signs: Temperature 100.4 F, respiratory rate 32, pulse 31, blood pressure 159/39, SpO2 82% Significant Labs: Sodium 147, potassium 2.7, bicarb 18, glucose 166, BUN 51, creatinine 2.4, albumin 2.0, AST 602, ALT 275, high-sensitivity troponin 143, CK 823,, WBC 2.8, lactic acid 8.5, hemoglobin 8.4, hematocrit 20.6, INR 3.1, PTT 32.5, serum acetaminophen less than 10, salicylate level less than 7, ethyl alcohol less than 10 ABG postintubation: pH 7.37/pCO2 21/pO2 151/bicarb 12.1 COVID/flu/RSV PCR is negative Urinalysis consistent with UTI Urine drug screen currently pending Imaging Chest X-ray>>IMPRESSION: 1. Small left pneumothorax. No mediastinal shift. 2. Mild-to-moderate pulmonary vascular congestion. Medications Administered: 2.6 L of IV fluids ordered, IV cefepime, Flagyl, vancomycin ordered peripheral vasopressors started   Given patient's severe hypoxia and concern for inability to protect his airway, he was intubated by ED provider.  Shortly after intubation he suffered a brief cardiac arrest, where he developed bradycardia progressing  to asystole requiring approximately 2 rounds of ACLS to regain ROSC   PCCM asked to admit for further workup and treatment.  Please see "Significant Hospital Events" section below for full detailed hospital course.   Significant Hospital Events: Including procedures, antibiotic start and stop dates in addition to other pertinent events   3/28: Presented to ED altered, minimally responsive, and severely hypoxic.  Required intubation for airway protection.  Shortly after intubation in the ED suffered brief cardiac arrest beginning with bradycardia progressing to asystole requiring roughly 2 rounds of ACLS prior to ROSC.  PCCM asked to admit.  Critically ill with multiorgan failure, high risk for death.  Chest tube placed for left-sided pneumothorax.  Later after arrival to ICU, with brief V.fib arrest with ROSC after 1 round of ACLS (1 defibrillation, 1 epi, 1 bicarb).  Code status changed to DNR when sister arrived at bedside.  Pt expired shortly after, despite 4 vasopressors maxed out and max ventilator support.    Pertinent Labs and Studies  Significant Diagnostic Studies DG Chest Port 1 View Result Date: 11/12/2023 CLINICAL DATA:  Chest tube placement, pneumothorax EXAM: PORTABLE CHEST 1 VIEW COMPARISON:  11/05/2023 at 12:54 p.m. FINDINGS: A pigtail left pleural drainage catheters been placed. Prior pneumothorax has resolved. Persistent bibasilar airspace opacities, left greater than right. Heart size within normal limits. Right IJ central line tip: Upper SVC. Endotracheal tube tip: 4.0 cm above the carina. Nasogastric tube enters the stomach. Atherosclerotic calcification of the aortic arch. IMPRESSION: 1. Interval placement of a pigtail left pleural drainage catheter, with resolution of prior pneumothorax. 2. Persistent bibasilar airspace opacities, left greater than right. 3. Aortic Atherosclerosis (ICD10-I70.0). Electronically Signed   By: Gaylyn Rong M.D.   On: 11/15/2023 14:31   DG  Abd 1 View Result Date: 11/10/2023 CLINICAL DATA:  Orogastric tube placement EXAM: ABDOMEN - 1 VIEW COMPARISON:  03/10/2022 FINDINGS: Nasogastric tube side port and tip are in the stomach body, satisfactorily positioned. Basilar airspace opacities are again observed. Mild gaseous prominence of a loop colon in the upper abdomen, probably transverse colon likely incidental. Abdominal aortic atherosclerosis is observed. IMPRESSION: 1. Nasogastric tube side port and tip are in the stomach body, satisfactorily positioned. 2. Basilar airspace opacities are again observed. 3.  Aortic Atherosclerosis (ICD10-I70.0). Electronically Signed   By: Gaylyn Rong M.D.   On: 10/27/2023 14:21   DG Chest Port 1 View Result Date: 11/13/2023 CLINICAL DATA:  Central line placement EXAM: PORTABLE CHEST - 1 VIEW COMPARISON:  Earlier film of the same day FINDINGS: Gastric tube has been advanced into the decompressed stomach. Right IJ central line has been placed to the proximal SVC. Stable endotracheal tube. Small lateral pneumothorax at the left lung base is slightly more conspicuous. Perihilar and infrahilar airspace opacities left greater than right are stable. Heart size and mediastinal contours are within normal limits. No effusion. Visualized bones unremarkable. IMPRESSION: 1. Right IJ central line to the proximal SVC. 2. Small left basilar pneumothorax. Electronically Signed   By: Corlis Leak M.D.   On: 11/08/2023 14:20   DG Chest Portable 1 View Result Date: 11/09/2023 CLINICAL DATA:  Aspiration. EXAM: PORTABLE CHEST 1 VIEW COMPARISON:  08/23/2023. FINDINGS: Low lung volume. There is small left pneumothorax. No mediastinal shift. Mild to moderate diffuse pulmonary vascular congestion with hilar predominance. Bilateral lung fields are otherwise clear. No dense consolidation or lung collapse. Bilateral costophrenic angles are clear. Stable cardio-mediastinal silhouette. No acute osseous abnormalities. The soft tissues are  within normal limits. Endotracheal tube tip is 5.6 cm from the carina. IMPRESSION: 1. Small left pneumothorax. No mediastinal shift. 2. Mild-to-moderate pulmonary vascular congestion.  These results will be called to the ordering clinician or representative by the Radiologist Assistant, and communication documented in the PACS or [employer]. Electronically Signed   By: Jules Schick M.D.   On: 10/26/2023 11:42    Microbiology Recent Results (from the past 240 hours)  Resp panel by RT-PCR (RSV, Flu A&B, Covid) Anterior Nasal Swab     Status: None   Collection Time: 11/11/2023 12:47 PM   Specimen: Anterior Nasal Swab  Result Value Ref Range Status   SARS Coronavirus 2 by RT PCR NEGATIVE NEGATIVE Final    Comment: (NOTE) SARS-CoV-2 target nucleic acids are NOT DETECTED.  The SARS-CoV-2 RNA is generally detectable in upper respiratory specimens during the acute phase of infection. The lowest concentration of SARS-CoV-2 viral copies this assay can detect is 138 copies/mL. A negative result does not preclude SARS-Cov-2 infection and should not be used as the sole basis for treatment or other patient management decisions. A negative result may occur with  improper specimen collection/handling, submission of specimen other than nasopharyngeal swab, presence of viral mutation(s) within the areas targeted by this assay, and inadequate number of viral copies(<138 copies/mL). A negative result must be combined with clinical observations, patient history, and epidemiological information. The expected result is Negative.  Fact Sheet for Patients:  BloggerCourse.com  Fact Sheet for Healthcare Providers:  SeriousBroker.it  This test is no t yet approved or cleared by the [place] FDA and  has been authorized for detection and/or diagnosis of SARS-CoV-2 by FDA under an Emergency Use Authorization (EUA). This EUA will remain  in effect  (meaning this test can be used) for the duration of the COVID-19 declaration under Section 564(b)(1) of the Act, 21 U.S.C.section 360bbb-3(b)(1), unless the authorization is terminated  or revoked sooner.       Influenza A by PCR NEGATIVE NEGATIVE Final   Influenza B by PCR NEGATIVE NEGATIVE Final    Comment: (NOTE) The Xpert Xpress SARS-CoV-2/FLU/RSV plus assay is intended as an aid in the diagnosis of influenza from Nasopharyngeal swab specimens and should not be used as a sole basis for treatment. Nasal washings and aspirates are unacceptable for Xpert Xpress SARS-CoV-2/FLU/RSV testing.  Fact Sheet for Patients: BloggerCourse.com  Fact Sheet for Healthcare Providers: SeriousBroker.it  This test is not yet approved or cleared by the [place] FDA and has been authorized for detection and/or diagnosis of SARS-CoV-2 by FDA under an Emergency Use Authorization (EUA). This EUA will remain in effect (meaning this test can be used) for the duration of the COVID-19 declaration under Section 564(b)(1) of the Act, 21 U.S.C. section 360bbb-3(b)(1), unless the authorization is terminated or revoked.     Resp Syncytial Virus by PCR NEGATIVE NEGATIVE Final    Comment: (NOTE) Fact Sheet for Patients: BloggerCourse.com  Fact Sheet for Healthcare Providers: SeriousBroker.it  This test is not yet approved or cleared by the [place] FDA and has been authorized for detection and/or diagnosis of SARS-CoV-2 by FDA under an Emergency Use Authorization (EUA). This EUA will remain in effect (meaning this test can be used) for the duration of the COVID-19 declaration under Section 564(b)(1) of the Act, 21 U.S.C. section 360bbb-3(b)(1), unless the authorization is terminated or revoked.  Performed at [facility], [address], [place], [place] [address]     Lab Basic  Metabolic Panel: Recent Labs  Lab 10/20/2023 0913 11/17/2023 1258  NA 147*  --   K 2.7*  --   CL 111  --  CO2 18*  --   GLUCOSE 166*  --   BUN 51*  --   CREATININE 2.43*  --   CALCIUM 8.4*  --   MG  --  2.3   Liver Function Tests: Recent Labs  Lab 10/29/2023 0913  AST 602*  ALT 275*  ALKPHOS 41  BILITOT 1.0  PROT 5.4*  ALBUMIN 2.0*   No results for input(s): "LIPASE", "AMYLASE" in the last 168 hours. No results for input(s): "AMMONIA" in the last 168 hours. CBC: Recent Labs  Lab 10/24/2023 0913  WBC 2.8*  HGB 8.4*  HCT 28.6*  MCV 96.9  PLT 266   Cardiac Enzymes: Recent Labs  Lab 11/08/2023 1258  CKTOTAL 823*   Sepsis Labs: Recent Labs  Lab 11/10/2023 0913  WBC 2.8*  LATICACIDVEN 8.5*    Procedures/Operations  3/28: Endotracheal intubation 3/28: Right internal jugular CVC  3/28: Right radial A-line 3/28: Left sided chest tube placement     Harlon Ditty, AGACNP-BC [facility] Prefer epic messenger for cross cover needs If after hours, please call E-link  Judithe Modest 11/09/2023, 4:07 PM

## 2023-11-20 NOTE — Progress Notes (Signed)
 1235- Pt to unit and placed on monitor. Pt unresponsive - Dr Belia Heman and Leanord Asal, NP notified of arrival. Full assessment complete, see flow sheets.  1306- BP decreasing despite increasing vasopressors. Leanord Asal, NP to bedside and orders to be placed for 500 LR bolus and 1 amp bicarb.  1326- Time out for left chest tube by Dr Belia Heman.  1330- Left chest tube placement by Dr Belia Heman, and placed to -20 suction.   1505- Vfib on monitor, code blue called and compressions initiated. See code documentation.  1505- ROSC achieved. Pt's sister at bedside, code status to be changed to DNR.  1553 - Loss of pulse on arterial line noted. Pt's sister at bedside.   1556- Time of death. Family at bedside. Dr Belia Heman notified.

## 2023-11-20 NOTE — ED Notes (Signed)
 Critical potassium 2.7 and critical troponin 143. Jeri Modena NP made aware at this time.

## 2023-11-20 NOTE — ED Notes (Signed)
 MD Tan Preparing for RSI at this time.

## 2023-11-20 NOTE — Procedures (Signed)
 Arterial Catheter Insertion Procedure Note  Jeremy Browning  [id number]  06/09/1961  Date:10/26/2023  Time:12:16 PM    Provider Performing: Jeremy Browning    Procedure: Insertion of Arterial Line (40981) with US guidance ([id number])   Indication(s) Blood pressure monitoring and/or need for frequent ABGs  Consent Unable to obtain consent due to emergent nature of procedure.  Anesthesia None   Time Out Verified patient identification, verified procedure, site/side was marked, verified correct patient position, special equipment/implants available, medications/allergies/relevant history reviewed, required imaging and test results available.   Sterile Technique Maximal sterile technique including full sterile barrier drape, hand hygiene, sterile gown, sterile gloves, mask, hair covering, sterile ultrasound probe cover (if used).   Procedure Description Area of catheter insertion was cleaned with chlorhexidine and draped in sterile fashion. With real-time ultrasound guidance an arterial catheter was placed into the right radial artery.  Appropriate arterial tracings confirmed on monitor.     Complications/Tolerance None; patient tolerated the procedure well.   EBL Minimal   Specimen(s) None    Jeremy Browning, AGACNP-BC [facility] Prefer epic messenger for cross cover needs If after hours, please call E-link

## 2023-11-20 NOTE — Progress Notes (Signed)
  Chaplain On-Call responded to Code Blue notification at 1507 hours.  Chaplain received medical update from NP Harlon Ditty, who reported that the Code Blue was of a brief duration, and that the critical issue for the patient now is rapidly dropping blood pressure.  Chaplain met the patient's sister, Jeremy Browning, at bedside.  Chaplain provided spiritual and emotional support as Ms. Nettleton was attempting to contact and inform family members about this situation.  Chaplain assured Ms. Roger [facility] and Staff of further availability as needed.  Chaplain Morene Crocker., [facility]

## 2023-11-20 NOTE — ED Notes (Signed)
 Critical lactic 8.5 called at this time. MD made aware.

## 2023-11-20 NOTE — ED Triage Notes (Signed)
 Pt to ED via [facility] from [facility]. Pt aspirated yesterday morning at 0800. Per EMS staff is unknown his last known well. Pt found altered this morning at 0830. Pt unresponsive. Pt breathing spontaneously at this time. Per EMS initial SPO2 < 50. SPO2 73% on NRB. Pt baseline a&o x 4.

## 2023-11-20 NOTE — H&P (Signed)
 NAME:  Jeremy Browning, MRN:  [id number], DOB:  30-Dec-1960, LOS: 0 ADMISSION DATE:  11/09/2023, CONSULTATION DATE:  11/10/2023 REFERRING MD:  Dr. Jodie Echevaria, CHIEF COMPLAINT:  Acute Respiratory Distress, Altered Mental Status   Brief Pt Description / Synopsis:  63 y.o. 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  Course complicated by brief cardiac arrest in the ED following intubation (required approximately 2 rounds of ACLS prior to ROSC).  History of Present Illness:  Jeremy Browning is a 63 y.o. 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 2025 who presented to [facility] ED on 10/22/2023 from his nursing facility due to altered mental status, acute respiratory distress, and severe hypoxia.   Pt is currently intubated and unable to contribute to history and no family is currently available, therefore history is obtained from chart review.  Per ED and nursing notes, nursing staff at the facility found him altered and minimally responsive this morning.  Upon EMS arrival, he was found to have gurgling respirations and oxygen saturations less than 50%, and was febrile.  Nursing staff reported that he had an aspiration event yesterday at his facility, but that chest xray had been performed and was reportedly normal.  Of note he was recently admitted at [facility] from 08/21/23 through 08/28/23 for breakthrough seizures, 7 mm right occipital subdural hematoma, acute cystitis (Aerococcus and VRE on  cultures, completed course of Amoxicillin), and Acute Metabolic Encephalopathy.  He was discharged to rehab and plan was to restart his Eliquis on 09/02/23.  ED Course: Initial Vital Signs: Temperature 100.4 F, respiratory rate 32, pulse 31, blood pressure 159/39, SpO2 82% Significant Labs: Sodium 147, potassium 2.7, bicarb 18, glucose 166, BUN 51, creatinine 2.4, albumin 2.0, AST 602, ALT 275, high-sensitivity troponin 143, CK 823,, WBC 2.8, lactic acid 8.5, hemoglobin 8.4, hematocrit 20.6, INR 3.1, PTT 32.5, serum acetaminophen less than 10, salicylate level less than 7, ethyl alcohol less than 10 ABG postintubation: pH 7.37/pCO2 21/pO2 151/bicarb 12.1 COVID/flu/RSV PCR is negative Urinalysis consistent with UTI Urine drug screen currently pending Imaging Chest X-ray>>IMPRESSION: 1. Small left pneumothorax. No mediastinal shift. 2. Mild-to-moderate pulmonary vascular congestion. Medications Administered: 2.6 L of IV fluids ordered, IV cefepime, Flagyl, vancomycin ordered peripheral vasopressors started  Given patient's severe hypoxia and concern for inability to protect his airway, he was intubated by ED provider.  Shortly after intubation he suffered a brief cardiac arrest, where he developed bradycardia progressing to asystole requiring approximately 2 rounds of ACLS to regain ROSC  PCCM asked to admit for further workup and treatment.  Please see "Significant Hospital Events" section below for full detailed hospital course.   Pertinent  Medical History   Past Medical History:  Diagnosis Date   CHF (congestive heart failure) (HCC)    Chronic atrial fibrillation (HCC)    Diabetes mellitus type II, non insulin dependent (HCC)    Essential hypertension [name]   h/o Proteus pneumonia (HCC) 02/17/2021   Heart failure (HCC)    Unknown details.  By report no cardiac surgery   Seizures (HCC)  Severe hypoxic-ischemic encephalopathy    Sleep apnea    Stroke [facility])    Venous stasis  dermatitis of left lower extremity     Micro Data:  3/28: COVID/FLU/RSV PCR>> negative 3/28: Blood culture x2>> 3/28: Tracheal aspirate>> 3/28: Urine>>  Antimicrobials:   Anti-infectives (From admission, onward)    Start     Dose/Rate Route Frequency Ordered Stop   10/26/2023 1200  piperacillin-tazobactam (ZOSYN) IVPB 3.375 g        3.375 g 12.5 mL/hr over 240 Minutes Intravenous Every 8 hours 10/23/2023 1157     11/04/2023 1000  metroNIDAZOLE (FLAGYL) IVPB 500 mg        500 mg 100 mL/hr over 60 Minutes Intravenous  Once 10/27/2023 0951 11/03/2023 1124       Significant Hospital Events: Including procedures, antibiotic start and stop dates in addition to other pertinent events   3/28: Presented to ED altered, minimally responsive, and severely hypoxic.  Required intubation for airway protection.  Shortly after intubation in the ED suffered brief cardiac arrest beginning with bradycardia progressing to asystole requiring roughly 2 rounds of ACLS prior to ROSC.  PCCM asked to admit.  Critically ill with multiorgan failure, high risk for death.  Chest tube placed for left-sided pneumothorax.  Later after arrival to ICU, with brief V.fib arrest with ROSC after 1 round of ACLS (1 defibrillation, 1 epi, 1 bicarb).  Interim History / Subjective:  As outlined above under "Significant Hospital Events" section   Objective   Blood pressure 103/81, pulse (!) 121, temperature 99.4 F (37.4 C), resp. rate 20, height 5\' 7"  (1.702 m), SpO2 (!) 85%.    Vent Mode: PRVC FiO2 (%):  [100 %] 100 % Set Rate:  [20 bmp] 20 bmp Vt Set:  [500 mL] 500 mL PEEP:  [10 cmH20] 10 cmH20  No intake or output data in the 24 hours ending 11/08/2023 1218 There were no vitals filed for this visit.  Examination: General: Critically ill-appearing male, laying in bed, intubated on no sedation, no acute distress HENT: Atraumatic, normocephalic, neck supple, no JVD, dry mucous membranes, orally intubated Lungs: Coarse breath  sounds throughout, even, ventilator assisted, synchronous with the vent Cardiovascular: Tachycardia, regular rhythm, S1-S2, no murmurs, rubs or gallops Abdomen: Abdominal exam limited given altered mental status: Soft, nontender, nondistended, no guarding rebound tenderness, bowel sounds positive x 4 Extremities: Left arm contractured, trace edema bilateral lower extremities Neuro: Unresponsive on no sedation, pupils PERRLA sluggish at 2 mm bilaterally GU: Deferred  Resolved Hospital Problem list     Assessment & Plan:   #Brief In-Hospital Cardiac Arrest: suspect due to severe hypoxia  #Multifactorial Shock: Hypovolemic vs Septic vs Cardiogenic #Elevated Troponin in the setting of demand ischemia vs NSTEMI #Chronic HFpEF PMHx: HFpEF, Atrial Fibrillation on Eliquis, HTN, CAD, HLD -Continuous cardiac monitoring -Maintain MAP >65 -IV fluids -Vasopressors as needed to maintain MAP goal ~ currently on Levophed, Vasopressin, Neo, and Epi -Stress dose steroids -Trend lactic acid until normalized -Trend HS Troponin until peaked -Echocardiogram pending -Discussed with Dr. Belia Heman, will hold off on consulting Cardiology currently, felt they will be unable to offer cath due to hemodynamic instability  #Acute Hypoxic Respiratory Failure due to suspected aspiration and multiple metabolic derangements #Small Left Pneumothorax s/p Chest Tube Placement 3/28 #Intubated for Airway Protection PMHx: Sleep apnea -Full vent support, implement lung protective strategies -Plateau pressures less than 30 cm H20 -Wean FiO2 & PEEP as tolerated to maintain O2 sats >92% -Follow intermittent Chest X-ray & ABG  as needed -Spontaneous Breathing Trials when respiratory parameters met and mental status permits -Implement VAP Bundle -Prn Bronchodilators -ABX as above  #Meets SIRS Criteria on Presentation (Temperature 100.4, RR 32, WBC 2.8) #Severe Sepsis due to ... #Suspected Aspiration #UTI PMHx: Recent UTI in  Jan. 2025 (cultures at that time with Aerococcus and VRE) -Monitor fever curve -Trend WBC's & Procalcitonin -Follow cultures as above -Continue empiric Vancomycin and Zosyn pending cultures & sensitivities  #Acute Kidney Injury on CKD Stage IIIa #Rhabdomyolysis #Hypokalemia #Hypernatremia #Anion Gap Metabolic Acidosis in setting of Lactic Acidosis and AKI -Monitor I&O's / urinary output -Follow BMP -Ensure adequate renal perfusion -Avoid nephrotoxic agents as able -Replace electrolytes as indicated ~ Pharmacy following for assistance with electrolyte replacement -IV fluids -Start bicarb gtt -Check CK -Consider renal US  #Elevated LFT's, suspect shock liver -Avoid Hepatotoxic agents as able -Trend LFT's and coags -Check Acute Hepatitis panel and serum Acetaminophen -Obtain RUQ Abdominal US  #Anemia of Chronic Disease #Supra-therapeutic INR PMHx: On Eliquis for A.fib -Monitor for S/Sx of bleeding -Trend CBC -SCD's for VTE Prophylaxis (holding on chemical ppx/AC until stable enough to obtain CT Head to rule out head bleed) -Transfuse for Hgb <7  #Diabetes Mellitus -CBG's q4h; Target range of 140 to 180 -SSI -Follow ICU Hypo/Hyperglycemia protocol  #Acute Metabolic Encephalopathy #Sedation needs in setting of mechanical ventilation PMHx: 7 mm right occipital SDH in Jan 2025 (resumed Eliquis 09/02/23), Stroke with residual left sided weakness, seizures -Treatment of metabolic derangements as outlined above -Maintain a RASS goal of 0 to -1 -Fentanyl and Propofol as needed to maintain RASS goal -Avoid sedating medications as able -Daily wake up assessment -CT Head ordered to rule out brain bleed ~ but pt is currently too hemodynamically unstable to obtain -Obtain EEG -Check Ethanol, salicylates, tylenol levels along with UDS     Pt is critically ill with multiorgan failure. Prognosis is guarded, high risk for further decompensation, cardiac arrest, and death.   Given  current critical illness superimposed on multiple chronic co-morbidities and advanced age, overall long term prognosis is poor.  Recommend consideration of DNR/DNI status.  Will consult Palliative Care to assist with GOC discussions.    Best Practice (right click and "Reselect all SmartList Selections" daily)   Diet/type: NPO DVT prophylaxis: SCD GI prophylaxis: PPI Lines: Central line, Arterial Line, and yes and it is still needed Foley:  Yes, and it is still needed Code Status:  DNR Last date of multidisciplinary goals of care discussion [N/A]  3/28: Pt's sister updated via telephone plan of care.  Labs   CBC: Recent Labs  Lab 10/31/2023 0913  WBC 2.8*  HGB 8.4*  HCT 28.6*  MCV 96.9  PLT 266    Basic Metabolic Panel: Recent Labs  Lab 11/14/2023 0913  NA 147*  K 2.7*  CL 111  CO2 18*  GLUCOSE 166*  BUN 51*  CREATININE 2.43*  CALCIUM 8.4*   GFR: CrCl cannot be calculated (Unknown ideal weight.). Recent Labs  Lab 10/27/2023 0913  WBC 2.8*  LATICACIDVEN 8.5*    Liver Function Tests: Recent Labs  Lab 11/17/2023 0913  AST 602*  ALT 275*  ALKPHOS 41  BILITOT 1.0  PROT 5.4*  ALBUMIN 2.0*   No results for input(s): "LIPASE", "AMYLASE" in the last 168 hours. No results for input(s): "AMMONIA" in the last 168 hours.  ABG    Component Value Date/Time   PHART 7.37 10/22/2023 1050   PCO2ART 21 (L) 11/02/2023 1050   PO2ART 151 (  H) 11/01/2023 1050   HCO3 12.1 (L) 10/22/2023 1050   TCO2 24 03/08/2022 1819   ACIDBASEDEF 11.7 (H) 10/28/2023 1050   O2SAT 100 11/07/2023 1050     Coagulation Profile: Recent Labs  Lab 11/19/2023 0913  INR 3.1*    Cardiac Enzymes: No results for input(s): "CKTOTAL", "CKMB", "CKMBINDEX", "TROPONINI" in the last 168 hours.  HbA1C: Hgb A1c MFr Bld  Date/Time Value Ref Range Status  08/27/2023 05:28 AM 4.3 (L) 4.8 - 5.6 % Final    Comment:    (NOTE)         Prediabetes: 5.7 - 6.4         Diabetes: >6.4         Glycemic control  for adults with diabetes: <7.0   03/11/2022 06:23 AM 5.5 4.8 - 5.6 % Final    Comment:    (NOTE) Pre diabetes:          5.7%-6.4%  Diabetes:              >6.4%  Glycemic control for   <7.0% adults with diabetes     CBG: Recent Labs  Lab 11/19/2023 0912 10/25/2023 0955 11/03/2023 1057  GLUCAP 53* 65* 113*    Review of Systems:   Unable to assess due to AMS/intubation/critical illness   Past Medical History:  He,  has a past medical history of CHF (congestive heart failure) (HCC), Chronic atrial fibrillation (HCC), Diabetes mellitus type II, non insulin dependent (HCC), Essential hypertension Irving Burton like), h/o Proteus pneumonia (HCC) (02/17/2021), Heart failure (HCC), Seizures (HCC), Severe hypoxic-ischemic encephalopathy, Sleep apnea, Stroke (HCC), and Venous stasis dermatitis of left lower extremity.   Surgical History:   Past Surgical History:  Procedure Laterality Date   HARDWARE REMOVAL Left 02/10/2022   Procedure: HARDWARE REMOVAL LEFT TIBIA;  Surgeon: Nadara Mustard, MD;  Location: [facility] OR;  Service: Orthopedics;  Laterality: Left;   I & D EXTREMITY Left 02/10/2022   Procedure: DEBRIDEMENT OF LEFT TIBIA;  Surgeon: Nadara Mustard, MD;  Location: [facility] OR;  Service: Orthopedics;  Laterality: Left;   LEFT HEART CATH AND CORONARY ANGIOGRAPHY N/A 12/25/2020   Procedure: LEFT HEART CATH AND CORONARY ANGIOGRAPHY;  Surgeon: Marykay Lex, MD;  Location: [facility] INVASIVE CV LAB;  Service: Cardiovascular;  Laterality: N/A; (In setting of Acute Hypoxic and Hypercapnic Respiratory Failure with Flash Pulm Edema/Hypertensive Emergency) - Angiographically minimal CAD.  Moderately reduced LV function with EF of 35 to 45%. LV P-EDP 101/21 mmHg - 30 mmHg   LEFT HEART CATH AND CORONARY ANGIOGRAPHY  09/12/2018   [facility]): Ostial LAD ~20% otherwise normal coronary arteries. LVEDP 20 mmHg;- HTN Emergency related Type II MI>   PEG PLACEMENT N/A 01/11/2021   Procedure: PERCUTANEOUS ENDOSCOPIC GASTROSTOMY  (PEG) PLACEMENT;  Surgeon: Midge Minium, MD;  Location: [facility] ENDOSCOPY;  Service: Endoscopy;  Laterality: N/A;   RIGHT HEART CATH N/A 12/25/2020   Procedure: RIGHT HEART CATH;  Surgeon: Marykay Lex, MD;  Location: [facility] INVASIVE CV LAB;  Service: Cardiovascular; R IJ: (Acute Hypoxic and Hypercapnic Respiratory Failure with Flash Pulm Edema/Hypertensive Emergency): CO-CI 6.54-2.82. Mild Secondary Pulmonary Hypertension (PCWP 30 mmHg.  PAP 47/13 mmHg with a mean PAP of 33 mmHg, RAP 22 mmHg in setting of AoP-MAP 97/59 mmHg - 71 mmHg,   TRACHEOSTOMY TUBE PLACEMENT N/A 01/12/2021   Procedure: TRACHEOSTOMY;  Surgeon: Bud Face, MD;  Location: [facility] ORS;  Service: ENT;  Laterality: N/A;   TRANSTHORACIC ECHOCARDIOGRAM  09/11/2018   [facility]: Moderately reduced LVEF ~35%.  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 Mild-mod AI, Trivial MR?TR. LVEDD 6.0 (reduced compared to 2017: EF ~40-45%, LVEDD 5.6   TRANSTHORACIC ECHOCARDIOGRAM  12/27/2020   [facility]: (In Setting of Acute Hypoxic and Hypercapnic Respiratory Failure with Flash Pulm Edema/Hypertensive Emergency)  EF 45-50%. Mildly reduced LVEF with global HK. Moderately dilated LV with Gr 1 DD - mild LA dilation. Normal RV size Fxn.  Normal Vavles   TRANSTHORACIC ECHOCARDIOGRAM  03/07/2021   [facility]: EF improved to 60-65% with Nl LV Fxn & ~ normal diastolic fxn. Normal RV size & fxn. Mild MR & AI.  Normal LA size.   Unknown       Social History:   reports that he quit smoking about 5 years ago. His smoking use included cigarettes. He started smoking about 12 years ago. He has a 3.5 pack-year smoking history. He has never used smokeless tobacco. He reports that he does not currently use alcohol. He reports that he does not currently use drugs.   Family History:  His family history includes Alcohol abuse in his father; Asthma in his brother, mother, and sister; CVA in his mother; Depression in his father; Diabetes Mellitus II in his sister;  Hypertension in his brother, mother, and sister; Kidney failure in his mother.   Allergies No Known Allergies   Home Medications  Prior to Admission medications   Medication Sig Start Date End Date Taking? Authorizing Provider  atorvastatin (LIPITOR) 80 MG tablet Take 80 mg by mouth daily.    [provider]  Carboxymethylcellulose Sod PF 1 % GEL Apply in both eyes in the morning and at bedtime.    [provider]  cholecalciferol (VITAMIN D3) 25 MCG (1000 UNIT) tablet Take 1,000 Units by mouth daily.    [provider]  diazepam (VALIUM) 5 MG/ML injection Inject 1 mL (5 mg total) into the vein daily as needed. 08/28/23   Alford Highland, MD  diclofenac Sodium (VOLTAREN) 1 % GEL Apply to left elbow tid 08/28/23   Alford Highland, MD  divalproex (DEPAKOTE) 250 MG DR tablet Take 3 tablets (750 mg total) by mouth 2 (two) times daily. 04/21/22   Darlin Priestly, MD  enoxaparin (LOVENOX) 40 MG/0.4ML injection Inject 0.4 mLs (40 mg total) into the skin daily for 4 days. 08/29/23 09/02/23  Alford Highland, MD  ferrous sulfate 325 (65 FE) MG EC tablet Take 325 mg by mouth daily with breakfast.    [provider]  furosemide (LASIX) 20 MG tablet Take 40 mg by mouth daily.    [provider]  levETIRAcetam (KEPPRA) 1000 MG tablet Take 1 tablet (1,000 mg total) by mouth 2 (two) times daily. 08/28/23   Alford Highland, MD  losartan (COZAAR) 25 MG tablet Take 25 mg by mouth daily.    [provider]  melatonin 5 MG TABS Take 5 mg by mouth at bedtime.    [provider]  sertraline (ZOLOFT) 25 MG tablet Take 75 mg by mouth daily.    [provider]  tamsulosin (FLOMAX) 0.4 MG CAPS capsule Take 0.4 mg by mouth daily.    [provider]     Critical care time: 60 minutes     Harlon Ditty, AGACNP-BC [facility] Prefer epic messenger for cross cover needs If after hours, please call E-link

## 2023-11-20 NOTE — ED Notes (Signed)
Pharmacy called at this time

## 2023-11-20 NOTE — ED Provider Notes (Addendum)
 [facility] Provider Note    Event Date/Time   First MD Initiated Contact with Patient 11/11/2023 [phone]     (approximate)   History   Respiratory Distress   HPI  Jeremy Browning is a 63 y.o. male with history of CHF, chronic A-fib, seizures, COPD, presenting with altered mental status and hypoxia.  Per EMS, he had an aspiration episode yesterday, had a chest x-ray that was done that was reportedly normal, nurse at the facility found him today altered.  EMS noted that he was breathing spontaneously, typically ANO x 4, found him to have an SpO2 of less than 50, place, nonrebreather with sats in the 70s.  On arrival, he was satting in the 35s to low 80s.  Not very responsive.  Not withdrawing to pain, gurgling.  Was also found to be febrile per EMS.  Independent history obtained from EMS.  On independent chart review, he has history of prior MCA stroke with left-sided hemiparesis and seizures, has had breakthrough seizures in the past for which she has been admitted, last admission was in early January, also found to have hypoglycemia during the episode.  Also found to have a subdural hematoma at that time.  Aspirin as well as Eliquis was held.  The note states that he could restart the Eliquis on January 12.     Physical Exam   Triage Vital Signs: ED Triage Vitals  Encounter Vitals Group     BP 11/06/2023 0905 (!) 159/139     Systolic BP Percentile --      Diastolic BP Percentile --      Pulse Rate 11/15/2023 0915 (!) 31     Resp 11/03/2023 0905 (!) 32     Temp --      Temp src --      SpO2 11/03/2023 0915 (!) 82 %     Weight --      Height 11/15/2023 0921 5\' 7"  (1.702 m)     Head Circumference --      Peak Flow --      Pain Score 11/02/2023 0920 0     Pain Loc --      Pain Education --      Exclude from Growth Chart --     Most recent vital signs: Vitals:   10/23/2023 1058 10/23/2023 1100  BP: (!) 108/35 (!) 126/23  Pulse: (!) 119 (!) 119  Resp: 20 20  Temp:  (!) 100.6 F (38.1 C) (!) 100.4 F (38 C)  SpO2: (!) 87% (!) 88%     General: Tachypneic, not very responsive, not withdrawing to pain CV:  Good peripheral perfusion.  Tachycardic Resp:  Normal effort.  Coarse breath sounds bilaterally Abd:  No distention.  Soft nontender Other:  Trace lower extremity edema bilaterally.  Palpable DP pulses bilaterally.   ED Results / Procedures / Treatments   Labs (all labs ordered are listed, but only abnormal results are displayed) Labs Reviewed  CBC - Abnormal; Notable for the following components:      Result Value   WBC 2.8 (*)    RBC 2.95 (*)    Hemoglobin 8.4 (*)    HCT 28.6 (*)    MCHC 29.4 (*)    RDW 17.9 (*)    nRBC 1.1 (*)    All other components within normal limits  BLOOD GAS, ARTERIAL - Abnormal; Notable for the following components:   pCO2 arterial 21 (*)    pO2, Arterial 151 (*)  Bicarbonate 12.1 (*)    Acid-base deficit 11.7 (*)    All other components within normal limits  CBG MONITORING, ED - Abnormal; Notable for the following components:   Glucose-Capillary 53 (*)    All other components within normal limits  CULTURE, BLOOD (ROUTINE X 2)  CULTURE, BLOOD (ROUTINE X 2)  RESP PANEL BY RT-PCR (RSV, FLU A&B, COVID)  RVPGX2  COMPREHENSIVE METABOLIC PANEL WITH GFR  PROTIME-INR  URINALYSIS, W/ REFLEX TO CULTURE (INFECTION SUSPECTED)  LACTIC ACID, PLASMA  LACTIC ACID, PLASMA  BLOOD GAS, VENOUS  URINE DRUG SCREEN, QUALITATIVE ([facility] ONLY)  TROPONIN I (HIGH SENSITIVITY)     EKG  Atrial fibrillation, rate 149, widened QRS, widened QTc, intraventricular conduction delay, no obvious ischemic ST elevation, does not significantly change compared to prior   RADIOLOGY X-ray on my interpretation showed bilateral opacities consistent with pneumonia, ET tube seen above the carina in appropriate position.   PROCEDURES:  Critical Care performed: Yes, see critical care procedure note(s)  .Critical Care  Performed by:  Claybon Jabs, MD Authorized by: Claybon Jabs, MD   Critical care provider statement:    Critical care time (minutes):  80   Critical care was necessary to treat or prevent imminent or life-threatening deterioration of the following conditions:  Sepsis, shock, respiratory failure and circulatory failure   Critical care was time spent personally by me on the following activities:  Development of treatment plan with patient or surrogate, discussions with consultants, evaluation of patient's response to treatment, examination of patient, ordering and review of laboratory studies, ordering and review of radiographic studies, ordering and performing treatments and interventions, pulse oximetry, re-evaluation of patient's condition and review of old charts Procedure Name: Intubation Date/Time: 10/23/2023 10:11 AM  Performed by: Claybon Jabs, MDOxygen Delivery Method: Non-rebreather mask Induction Type: Rapid sequence Laryngoscope Size: 4 and Glidescope Tube size: 7.5 mm Number of attempts: 1 Placement Confirmation: ETT inserted through vocal cords under direct vision, Positive ETCO2, CO2 detector and Breath sounds checked- equal and bilateral Secured at: 23 cm Tube secured with: ETT holder       MEDICATIONS ORDERED IN ED: Medications  norepinephrine (LEVOPHED) 4-5 MG/250ML-% infusion SOLN (  Not Given 10/29/2023 0956)  propofol (DIPRIVAN) 1000 MG/100ML infusion (0 mcg/kg/min  Hold 10/26/2023 0956)  0.9 %  sodium chloride infusion (has no administration in time range)  norepinephrine (LEVOPHED) 4mg  in (0.016 mg/mL) premix infusion (19 mcg/min Intravenous Rate/Dose Change 10/29/2023 1041)  vasopressin (PITRESSIN) 20 Units in 100 mL (0.2 unit/mL) infusion-*FOR SHOCK* (0.03 Units/min Intravenous New Bag/Given 11/03/2023 1032)  metroNIDAZOLE (FLAGYL) IVPB 500 mg (500 mg Intravenous New Bag/Given 10/20/2023 1033)  fentaNYL (SUBLIMAZE) injection 50 mcg (0 mcg Intravenous Hold 10/23/2023 1033)  fentaNYL  in NS (14mcg/ml) infusion-PREMIX (0 mcg/hr Intravenous Hold 11/01/2023 1053)  fentaNYL (SUBLIMAZE) bolus via infusion 50-100 mcg (has no administration in time range)  phenylephrine (NEO-SYNEPHRINE) 20mg /NS premix infusion (30 mcg/min Intravenous Rate/Dose Change 11/17/2023 1101)  PHENYLephrine 80 mcg/ml in normal saline Adult IV Push Syringe (For Blood Pressure Support) (has no administration in time range)  acetaminophen (TYLENOL) suppository 650 mg (has no administration in time range)  docusate sodium (COLACE) capsule 100 mg (has no administration in time range)  polyethylene glycol (MIRALAX / GLYCOLAX) packet 17 g (has no administration in time range)  hydrocortisone sodium succinate (SOLU-CORTEF) 100 MG injection 100 mg (has no administration in time range)  dextrose 50 % solution (50 mLs  Given 11/08/2023 0933)  etomidate (AMIDATE) injection 30 mg (30 mg Intravenous Given 11/01/2023 0934)  rocuronium (ZEMURON) injection 80 mg (80 mg Intravenous Given 11/12/2023 0935)  lactated ringers bolus 1,000 mL (1,000 mLs Intravenous New Bag/Given 11/09/2023 0935)  EPINEPHrine (ADRENALIN) 1 MG/10ML injection (1 mg Intravenous Given 10/22/2023 0940)  sodium bicarbonate injection (50 mEq Intravenous Given 11/03/2023 0941)  EPINEPHrine (ADRENALIN) 1 MG/10ML injection (1 mg Intravenous Given 10/20/2023 0943)  lactated ringers bolus 1,600 mL (1,600 mLs Intravenous New Bag/Given 10/26/2023 1054)  dextrose 5 % bolus 250 mL (0 mLs Intravenous Stopped 11/19/2023 1053)  phenylephrine 80 mcg/10 mL injection (  Given 11/04/2023 1049)     IMPRESSION / MDM / ASSESSMENT AND PLAN / ED COURSE  I reviewed the triage vital signs and the nursing notes.                              Differential diagnosis includes, but is not limited to, sepsis secondary to aspiration pneumonia, hypoxemic respiratory failure, viral illness, electrolyte derangements, ACS.  Sepsis labs, troponin, EKG, viral p anel, IV fluids.  Will provide IV antibiotics  Vanco, cefepime, Flagyl.  Given altered mental status, hypoxia, he is not BiPAP candidate, he will need to be intubated for airway protection as well as hypoxia.    Patient was a difficult IV stick, nurses attempted multiple times without success while I attempted with ultrasound IV but patient's veins are either too small or too deep.  Tried looking for an EJ but his was not visible or too small on ultrasound.  While the IV stick team was called and coming, I attempted a peripheral IJ using sterile technique but patient's IJ was too collapsing even with light ultrasound pressure.  Suspect that he is severely dehydrated.  IV stick nurse was able to place an IV at his right upper extremity.  After that was done, he was prepped for intubation, patient was intubated by paramedic student under my supervision.  He was induced with etomidate and roc.  Successful intubation was done with 1 attempt, postintubation blood pressure was normal.  He is getting sepsis fluid bolus.  Was notified by nurse that he started to become bradycardic.  Lost pulses.  2 rounds of CPR was done, 1 amp of bicarb, 1 of epi was given.  Manage to get pulses back after her second pulse check.  I was able to place a right EJ during CPR.  Once pulses are back, Levophed was started for hypotension, ordered vasopressin as second line if unable to get his MAP above 65.    Consulted ICU for admission, they want labs and a CT head to be resulted to make sure no subarachnoid hemorrhage, he will need to be transferred to [facility] if there is bleed.  Reconsulted ICU given that patient has had escalating pressor requirements, added vasopressin as well as phenylephrine.  They will evaluate the patient and admit him.  Called patient's brother as well as sister who are listed as his contact, left voice messages for both of them to call me back.  Also given Tylenol.  He is admitted.  Patient's presentation is most consistent with acute presentation with  potential threat to life or bodily function.    Clinical Course as of 11/15/2023 1101  Fri Nov 16, 2023  1101 Review of labs so far, glucose is 53, he was given dextrose, ABG was done postintubation, pH is normal, pCO2 is low, bicarb is also low.  Likely mixed picture.  He is also leukopenic.  Lactate is elevated. [TT]    Clinical Course User Index [TT] Claybon Jabs, MD     FINAL CLINICAL IMPRESSION(S) / ED DIAGNOSES   Final diagnoses:  Septic shock (HCC)  Acute respiratory failure with hypoxia (HCC)  Altered mental status, unspecified altered mental status type  Elevated lactic acid level     Rx / DC Orders   ED Discharge Orders     None        Note:  This document was prepared using Dragon voice recognition software and may include unintentional dictation errors.    Claybon Jabs, MD 11/08/2023 1100    Claybon Jabs, MD 11/04/2023 [phone]

## 2023-11-20 NOTE — ED Notes (Signed)
 Difficulty obtaining IV access at this time. Multiple RN attempts. Multuiple Paramedic attempts. MD at bedside attempting internal jugular at this time.

## 2023-11-20 NOTE — Consult Note (Addendum)
 Pharmacy Consult Note - Electrolytes  Sodium (mmol/L)  Date Value  10/22/2023 147 (H)   Potassium (mmol/L)  Date Value  10/28/2023 2.7 (LL)   Magnesium (mg/dL)  Date Value  40/98/1191 2.3   Calcium (mg/dL)  Date Value  47/82/9562 8.4 (L)   Albumin (g/dL)  Date Value  13/03/6577 2.0 (L)   Phosphorus (mg/dL)  Date Value  46/96/2952 3.7   Corrected Ca: 10 mg/dL  ASSESSMENT: 62 y.o. male with PMH CHF, Afib, seizures, COPD, MCA stroke with left-sided hemiparesis and seizures, SDH who presents with AMS, hypoxia, hypotension. LA 8.5. Now on vasopressors, admitted to CCU. Pharmacy has been consulted to monitor and replace electrolytes. Patient's renal function is currently poor because he is in AKI. He is significantly hypokalemic.  Lab Results  Component Value Date   CREATININE 2.43 (H) 10/30/2023   CREATININE 1.10 08/27/2023   CREATININE 1.16 08/26/2023    mIVF: LR liter boluses; Sodium bicarb-SWFI @ 125 cc/hr  Pertinent medications: N/A  Goal of Therapy:  []  Electrolytes WNL [x]  K >=4.0 and Mg >=2.0  PLAN: K 2.7 >> potassium chloride 10 mEq IV x 6 ordered Recheck potassium tonight No other electrolyte replacement currently warranted Check electrolytes with next AM labs  Thank you for allowing pharmacy to be a part of this patient's care.  Will M. Dareen Piano, PharmD Clinical Pharmacist 10/26/2023 2:03 PM

## 2023-11-20 NOTE — Procedures (Addendum)
 Chest Tube Insertion: Indication:LEFT SIDED PTX Consent:EMERGENT   Risks and benefits explained in detail including risk of infection, bleeding, respiratory failure and death..   Hand washing performed prior to starting the procedure.   Type of Anesthesia: 1 % Lidocaine.   Procedure: An active timeout was performed and correct patient, name, & ID confirmed.  After explaining risks and benefits, patient positioned correctly for chest tube placement. Patient was prepped in a sterile fashion including chlorohexadine preps, sterile drape, sterile gown and sterile gloves. Introducer needle was placed and guide wire was inserted. Using Seldinger Technique, the introducer needle was removed and three dilators were used to create an opening for insertion of chest tube. Chest tube sutured in place and proper dressing used..   Findings: Gush if air heard upon insetion of needle, Chest tube placed bewteen 5-7 ribs midaxiilary line. Chest tube connected to draininage system with 20 cm suction  Chest tube connected to: pleurovac .   Number of Attempts:1  Complications:none  Estimated Blood Loss:none  Chest radiograph indicated and ordered.   Operator: Franne Forts, M.D.  [facility]  Medical Director [facility] Medical Director [facility] Cardio-Pulmonary Department

## 2023-11-20 NOTE — ED Notes (Signed)
 CPR initated. Pt asystole

## 2023-11-20 NOTE — IPAL (Signed)
  Interdisciplinary Goals of Care Family Meeting   Date carried out: 11/11/2023  Location of the meeting: Bedside  Member's involved: Physician, Nurse Practitioner, Bedside Registered Nurse, and Family Member or next of kin    GOALS OF CARE DISCUSSION(multiple times)  The Clinical status was relayed to family in detail- Sister Marylene Land at bedside  Updated and notified of patients medical condition- Patient remains unresponsive and will not open eyes to command.   Patient with increased WOB and using accessory muscles to breathe Explained to family course of therapy and the modalities  Patient with Progressive multiorgan failure with a very high probablity of a very minimal chance of meaningful recovery despite all aggressive and optimal medical therapy.   Family understands the situation. Severe multiple organs failing Signs of severe brain damage Patient is in the dying process   Sister has  consented and agreed to DNR after patient coded  Family are satisfied with Plan of action and management. All questions answered  Additional CC time 45 mins   Calliope Delangel Santiago Glad, M.D.  [facility]  Medical Director [facility] Medical Director [facility] Cardio-Pulmonary Department

## 2023-11-20 NOTE — Procedures (Signed)
 Central Venous Catheter Insertion Procedure Note  Ordell Prichett  [id number]  November 09, 1960  Date:10/30/2023  Time:12:16 PM   Provider Performing:Adelfa Lozito D Elvina Sidle   Procedure: Insertion of Non-tunneled Central Venous Catheter(36556) with US guidance (11914)   Indication(s) Medication administration and Difficult access  Consent Unable to obtain consent due to emergent nature of procedure.  Anesthesia Topical only with 1% lidocaine   Timeout Verified patient identification, verified procedure, site/side was marked, verified correct patient position, special equipment/implants available, medications/allergies/relevant history reviewed, required imaging and test results available.  Sterile Technique Maximal sterile technique including full sterile barrier drape, hand hygiene, sterile gown, sterile gloves, mask, hair covering, sterile ultrasound probe cover (if used).  Procedure Description Area of catheter insertion was cleaned with chlorhexidine and draped in sterile fashion.  With real-time ultrasound guidance a central venous catheter was placed into the right internal jugular vein. Nonpulsatile blood flow and easy flushing noted in all ports.  The catheter was sutured in place and sterile dressing applied.  Complications/Tolerance None; patient tolerated the procedure well. Chest X-ray is ordered to verify placement for internal jugular or subclavian cannulation.   Chest x-ray is not ordered for femoral cannulation.  EBL Minimal  Specimen(s) None   Line inserted to the 16 cm mark.   Harlon Ditty, AGACNP-BC [facility] Prefer epic messenger for cross cover needs If after hours, please call E-link

## 2023-11-20 NOTE — ED Notes (Addendum)
 Emergency central line being placed at this time by Jeri Modena NP. Time out performed prior to procedure.

## 2023-11-20 NOTE — ED Notes (Signed)
 [facility] called this RN to notify of pneumothorax in the L lung

## 2023-11-20 NOTE — Consult Note (Signed)
  Consultation Note Date: 10/29/2023   Patient Name: Jeremy Browning  DOB: 06/23/1961  MRN: [id number]  Age / Sex: 63 y.o., male  PCP: Jeremy Fat, MD Referring Physician: Erin Fulling, MD  Reason for Consultation: Establishing goals of care   HPI/Brief Hospital Course: 63 y.o. male  with past medical history of CHF, A. Fib, CVA on left sided hemiparesis, COPD, CKD stage IIIa, seizures and T2DM admitted from [facility] on 11/02/2023 with AMS and unresponsiveness. Reportedly from facility staff, had an aspiration episode 3/27, found altered and less responsive this AM.  On arrival to ED, found to be significantly hypoxic and minimally responsive, required emergent intubation, suffered brief cardiac arrest in ED requiring 2 rounds of ACLS prior to ROSC.  Noted previous admit 08/21/2023-08/28/2023 for breakthrough seizure, hypoglycemic episode and 7mm right occipital subdural hematoma--anticoagulation held with resume date of 09/02/23 (unclear if Eliquis has been resumed by facility)  Palliative medicine was consulted for assisting with goals of care conversations.  Subjective:  Extensive chart review has been completed prior to meeting patient including labs, vital signs, imaging, progress notes, orders, and available advanced directive documents from current and previous encounters.  Spoke with ICU team, family plans to visit at bedside later today.  Responded to [employer] called overhead.  Met with sister with ICU attending Dr. Belia Heman.  Introduced myself as a Publishing rights manager as a member of the palliative care team. Explained palliative medicine is specialized medical care for people living with serious illness. It focuses on providing relief from the symptoms and stress of a serious illness. The goal is to improve quality of life for both the patient and the family.   Sister expresses being in contact with her other siblings and everyone agrees  with DNR status at this time.  Sister shares she has two siblings out of state that are not planning to visit but a brother-Jeremy Browning that Jeremy Browning previously lived with that is likely on his way to hospital from work and requests I return once Jeremy Browning has arrived at bedside.  Update from nursing staff, Jeremy Browning passed.  Time Total: 55 minutes  Time spent includes: Detailed review of medical records (labs, imaging, vital signs), medically appropriate exam (mental status, respiratory, cardiac, skin), discussed with treatment team, counseling and educating patient, family and staff, documenting clinical information, medication management and coordination of care.   Signed by: Jeremy Deed, DNP, AGNP-C Palliative Medicine    Please contact Palliative Medicine Team phone at [phone] for questions and concerns.  For individual provider: See Loretha Stapler

## 2023-11-20 NOTE — ED Notes (Signed)
IV team at bedside at this time.

## 2023-11-20 DEATH — deceased

## 2023-11-22 LAB — BLOOD GAS, ARTERIAL
Acid-base deficit: 11.7 mmol/L — ABNORMAL HIGH (ref 0.0–2.0)
Acid-base deficit: 8.8 mmol/L — ABNORMAL HIGH (ref 0.0–2.0)
Bicarbonate: 12.1 mmol/L — ABNORMAL LOW (ref 20.0–28.0)
Bicarbonate: 21.1 mmol/L (ref 20.0–28.0)
FIO2: 1 %
MECHVT: 500 mL
O2 Saturation: 100 %
PEEP: 10 cmH2O
Patient temperature: 37
Patient temperature: 37
RATE: 20 {breaths}/min
pCO2 arterial: 21 mmHg — ABNORMAL LOW (ref 32–48)
pCO2 arterial: 62 mmHg — ABNORMAL HIGH (ref 32–48)
pH, Arterial: 7.37 (ref 7.35–7.45)
pH, Arterial: 7.14 — CL (ref 7.35–7.45)
pO2, Arterial: 151 mmHg — ABNORMAL HIGH (ref 83–108)
